# Patient Record
Sex: Female | Born: 1950 | Race: Black or African American | Hispanic: No | Smoking: Former smoker
Health system: Southern US, Community
[De-identification: ages and names within clinical notes are randomized; demographics above are authoritative.]

## PROBLEM LIST (undated history)

## (undated) DIAGNOSIS — J3089 Other allergic rhinitis: Secondary | ICD-10-CM

## (undated) DIAGNOSIS — Z972 Presence of dental prosthetic device (complete) (partial): Secondary | ICD-10-CM

## (undated) DIAGNOSIS — K219 Gastro-esophageal reflux disease without esophagitis: Secondary | ICD-10-CM

## (undated) DIAGNOSIS — M1990 Unspecified osteoarthritis, unspecified site: Secondary | ICD-10-CM

## (undated) DIAGNOSIS — F329 Major depressive disorder, single episode, unspecified: Secondary | ICD-10-CM

## (undated) DIAGNOSIS — K08109 Complete loss of teeth, unspecified cause, unspecified class: Secondary | ICD-10-CM

## (undated) DIAGNOSIS — F32A Depression, unspecified: Secondary | ICD-10-CM

## (undated) DIAGNOSIS — E785 Hyperlipidemia, unspecified: Secondary | ICD-10-CM

## (undated) DIAGNOSIS — Z9109 Other allergy status, other than to drugs and biological substances: Secondary | ICD-10-CM

## (undated) DIAGNOSIS — M25561 Pain in right knee: Secondary | ICD-10-CM

## (undated) DIAGNOSIS — D649 Anemia, unspecified: Secondary | ICD-10-CM

## (undated) DIAGNOSIS — M4800 Spinal stenosis, site unspecified: Secondary | ICD-10-CM

## (undated) DIAGNOSIS — M542 Cervicalgia: Secondary | ICD-10-CM

## (undated) DIAGNOSIS — M5416 Radiculopathy, lumbar region: Secondary | ICD-10-CM

## (undated) DIAGNOSIS — F319 Bipolar disorder, unspecified: Secondary | ICD-10-CM

## (undated) DIAGNOSIS — I10 Essential (primary) hypertension: Secondary | ICD-10-CM

## (undated) HISTORY — PX: APPENDECTOMY: SHX54

## (undated) HISTORY — DX: Radiculopathy, lumbar region: M54.16

## (undated) HISTORY — PX: TUBAL LIGATION: SHX77

## (undated) HISTORY — DX: Cervicalgia: M54.2

## (undated) HISTORY — DX: Major depressive disorder, single episode, unspecified: F32.9

## (undated) HISTORY — DX: Essential (primary) hypertension: I10

## (undated) HISTORY — PX: COLONOSCOPY: SHX174

## (undated) HISTORY — PX: ROTATOR CUFF REPAIR: SHX139

## (undated) HISTORY — DX: Depression, unspecified: F32.A

## (undated) HISTORY — PX: BREAST SURGERY: SHX581

## (undated) HISTORY — DX: Hyperlipidemia, unspecified: E78.5

## (undated) HISTORY — PX: OTHER SURGICAL HISTORY: SHX169

## (undated) HISTORY — DX: Gastro-esophageal reflux disease without esophagitis: K21.9

## (undated) HISTORY — DX: Pain in right knee: M25.561

---

## 1997-08-21 ENCOUNTER — Emergency Department (HOSPITAL_COMMUNITY): Admission: EM | Admit: 1997-08-21 | Discharge: 1997-08-21 | Payer: Self-pay | Admitting: Emergency Medicine

## 1997-09-19 ENCOUNTER — Emergency Department (HOSPITAL_COMMUNITY): Admission: EM | Admit: 1997-09-19 | Discharge: 1997-09-19 | Payer: Self-pay | Admitting: Emergency Medicine

## 1997-11-28 ENCOUNTER — Encounter: Payer: Self-pay | Admitting: Emergency Medicine

## 1997-11-28 ENCOUNTER — Emergency Department (HOSPITAL_COMMUNITY): Admission: EM | Admit: 1997-11-28 | Discharge: 1997-11-28 | Payer: Self-pay | Admitting: Emergency Medicine

## 1998-03-06 ENCOUNTER — Encounter: Payer: Self-pay | Admitting: Emergency Medicine

## 1998-03-06 ENCOUNTER — Emergency Department (HOSPITAL_COMMUNITY): Admission: EM | Admit: 1998-03-06 | Discharge: 1998-03-06 | Payer: Self-pay | Admitting: Emergency Medicine

## 1998-03-21 ENCOUNTER — Inpatient Hospital Stay (HOSPITAL_COMMUNITY): Admission: EM | Admit: 1998-03-21 | Discharge: 1998-03-25 | Payer: Self-pay | Admitting: Emergency Medicine

## 1998-03-22 ENCOUNTER — Encounter: Payer: Self-pay | Admitting: Internal Medicine

## 1998-03-25 ENCOUNTER — Inpatient Hospital Stay (HOSPITAL_COMMUNITY): Admission: AD | Admit: 1998-03-25 | Discharge: 1998-04-03 | Payer: Self-pay | Admitting: *Deleted

## 1998-04-12 ENCOUNTER — Encounter: Admission: RE | Admit: 1998-04-12 | Discharge: 1998-04-12 | Payer: Self-pay | Admitting: Internal Medicine

## 1998-04-24 ENCOUNTER — Encounter: Payer: Self-pay | Admitting: Emergency Medicine

## 1998-04-24 ENCOUNTER — Emergency Department (HOSPITAL_COMMUNITY): Admission: EM | Admit: 1998-04-24 | Discharge: 1998-04-24 | Payer: Self-pay | Admitting: Emergency Medicine

## 1998-07-07 ENCOUNTER — Emergency Department (HOSPITAL_COMMUNITY): Admission: EM | Admit: 1998-07-07 | Discharge: 1998-07-07 | Payer: Self-pay | Admitting: Emergency Medicine

## 1999-02-26 ENCOUNTER — Encounter: Payer: Self-pay | Admitting: Emergency Medicine

## 1999-02-26 ENCOUNTER — Emergency Department (HOSPITAL_COMMUNITY): Admission: EM | Admit: 1999-02-26 | Discharge: 1999-02-26 | Payer: Self-pay | Admitting: Emergency Medicine

## 1999-03-08 ENCOUNTER — Emergency Department (HOSPITAL_COMMUNITY): Admission: EM | Admit: 1999-03-08 | Discharge: 1999-03-08 | Payer: Self-pay | Admitting: Emergency Medicine

## 1999-03-08 ENCOUNTER — Encounter: Payer: Self-pay | Admitting: Emergency Medicine

## 1999-05-10 ENCOUNTER — Emergency Department (HOSPITAL_COMMUNITY): Admission: EM | Admit: 1999-05-10 | Discharge: 1999-05-10 | Payer: Self-pay | Admitting: Emergency Medicine

## 1999-08-06 ENCOUNTER — Emergency Department (HOSPITAL_COMMUNITY): Admission: EM | Admit: 1999-08-06 | Discharge: 1999-08-06 | Payer: Self-pay | Admitting: *Deleted

## 1999-12-29 ENCOUNTER — Emergency Department (HOSPITAL_COMMUNITY): Admission: EM | Admit: 1999-12-29 | Discharge: 1999-12-29 | Payer: Self-pay | Admitting: Emergency Medicine

## 2000-02-21 ENCOUNTER — Encounter: Admission: RE | Admit: 2000-02-21 | Discharge: 2000-02-21 | Payer: Self-pay | Admitting: Internal Medicine

## 2000-04-03 ENCOUNTER — Encounter: Admission: RE | Admit: 2000-04-03 | Discharge: 2000-04-03 | Payer: Self-pay | Admitting: Internal Medicine

## 2000-06-28 ENCOUNTER — Emergency Department (HOSPITAL_COMMUNITY): Admission: EM | Admit: 2000-06-28 | Discharge: 2000-06-28 | Payer: Self-pay | Admitting: Emergency Medicine

## 2000-06-29 ENCOUNTER — Encounter: Payer: Self-pay | Admitting: Emergency Medicine

## 2000-06-30 ENCOUNTER — Emergency Department (HOSPITAL_COMMUNITY): Admission: EM | Admit: 2000-06-30 | Discharge: 2000-06-30 | Payer: Self-pay | Admitting: Internal Medicine

## 2000-06-30 ENCOUNTER — Encounter: Payer: Self-pay | Admitting: Emergency Medicine

## 2000-07-31 ENCOUNTER — Emergency Department (HOSPITAL_COMMUNITY): Admission: EM | Admit: 2000-07-31 | Discharge: 2000-07-31 | Payer: Self-pay | Admitting: Emergency Medicine

## 2000-11-23 ENCOUNTER — Emergency Department (HOSPITAL_COMMUNITY): Admission: EM | Admit: 2000-11-23 | Discharge: 2000-11-23 | Payer: Self-pay | Admitting: Emergency Medicine

## 2002-06-02 ENCOUNTER — Emergency Department (HOSPITAL_COMMUNITY): Admission: EM | Admit: 2002-06-02 | Discharge: 2002-06-02 | Payer: Self-pay | Admitting: *Deleted

## 2002-06-02 ENCOUNTER — Encounter: Payer: Self-pay | Admitting: *Deleted

## 2002-06-02 ENCOUNTER — Encounter: Payer: Self-pay | Admitting: Emergency Medicine

## 2002-06-06 ENCOUNTER — Emergency Department (HOSPITAL_COMMUNITY): Admission: EM | Admit: 2002-06-06 | Discharge: 2002-06-06 | Payer: Self-pay | Admitting: Emergency Medicine

## 2003-05-20 ENCOUNTER — Encounter: Admission: RE | Admit: 2003-05-20 | Discharge: 2003-05-20 | Payer: Self-pay | Admitting: Internal Medicine

## 2003-06-16 ENCOUNTER — Emergency Department (HOSPITAL_COMMUNITY): Admission: EM | Admit: 2003-06-16 | Discharge: 2003-06-16 | Payer: Self-pay | Admitting: Family Medicine

## 2003-08-01 ENCOUNTER — Encounter: Admission: RE | Admit: 2003-08-01 | Discharge: 2003-08-01 | Payer: Self-pay | Admitting: Internal Medicine

## 2003-08-23 ENCOUNTER — Emergency Department (HOSPITAL_COMMUNITY): Admission: EM | Admit: 2003-08-23 | Discharge: 2003-08-23 | Payer: Self-pay | Admitting: Emergency Medicine

## 2003-11-04 ENCOUNTER — Emergency Department (HOSPITAL_COMMUNITY): Admission: EM | Admit: 2003-11-04 | Discharge: 2003-11-04 | Payer: Self-pay | Admitting: Emergency Medicine

## 2004-01-09 ENCOUNTER — Emergency Department (HOSPITAL_COMMUNITY): Admission: EM | Admit: 2004-01-09 | Discharge: 2004-01-09 | Payer: Self-pay | Admitting: Family Medicine

## 2004-03-08 ENCOUNTER — Ambulatory Visit: Payer: Self-pay | Admitting: Internal Medicine

## 2004-04-28 ENCOUNTER — Emergency Department (HOSPITAL_COMMUNITY): Admission: EM | Admit: 2004-04-28 | Discharge: 2004-04-28 | Payer: Self-pay | Admitting: Family Medicine

## 2004-07-09 ENCOUNTER — Emergency Department (HOSPITAL_COMMUNITY): Admission: EM | Admit: 2004-07-09 | Discharge: 2004-07-09 | Payer: Self-pay | Admitting: Emergency Medicine

## 2004-10-15 ENCOUNTER — Emergency Department (HOSPITAL_COMMUNITY): Admission: EM | Admit: 2004-10-15 | Discharge: 2004-10-15 | Payer: Self-pay | Admitting: Emergency Medicine

## 2005-01-12 ENCOUNTER — Emergency Department (HOSPITAL_COMMUNITY): Admission: EM | Admit: 2005-01-12 | Discharge: 2005-01-12 | Payer: Self-pay | Admitting: Emergency Medicine

## 2005-03-11 ENCOUNTER — Emergency Department (HOSPITAL_COMMUNITY): Admission: EM | Admit: 2005-03-11 | Discharge: 2005-03-11 | Payer: Self-pay | Admitting: Family Medicine

## 2005-11-02 ENCOUNTER — Emergency Department (HOSPITAL_COMMUNITY): Admission: EM | Admit: 2005-11-02 | Discharge: 2005-11-02 | Payer: Self-pay | Admitting: Family Medicine

## 2006-02-07 ENCOUNTER — Emergency Department (HOSPITAL_COMMUNITY): Admission: EM | Admit: 2006-02-07 | Discharge: 2006-02-07 | Payer: Self-pay | Admitting: Family Medicine

## 2006-02-11 HISTORY — PX: VAGINAL HYSTERECTOMY: SUR661

## 2006-03-07 ENCOUNTER — Emergency Department (HOSPITAL_COMMUNITY): Admission: EM | Admit: 2006-03-07 | Discharge: 2006-03-07 | Payer: Self-pay | Admitting: Family Medicine

## 2006-04-07 ENCOUNTER — Emergency Department (HOSPITAL_COMMUNITY): Admission: EM | Admit: 2006-04-07 | Discharge: 2006-04-07 | Payer: Self-pay | Admitting: Emergency Medicine

## 2006-07-08 ENCOUNTER — Emergency Department (HOSPITAL_COMMUNITY): Admission: EM | Admit: 2006-07-08 | Discharge: 2006-07-08 | Payer: Self-pay | Admitting: Family Medicine

## 2006-09-04 ENCOUNTER — Ambulatory Visit: Payer: Self-pay | Admitting: Obstetrics & Gynecology

## 2006-09-25 ENCOUNTER — Encounter (INDEPENDENT_AMBULATORY_CARE_PROVIDER_SITE_OTHER): Payer: Self-pay | Admitting: Gynecology

## 2006-09-25 ENCOUNTER — Ambulatory Visit: Payer: Self-pay | Admitting: Gynecology

## 2006-10-13 HISTORY — PX: OTHER SURGICAL HISTORY: SHX169

## 2006-10-14 ENCOUNTER — Inpatient Hospital Stay (HOSPITAL_COMMUNITY): Admission: RE | Admit: 2006-10-14 | Discharge: 2006-10-16 | Payer: Self-pay | Admitting: Gynecology

## 2006-10-14 ENCOUNTER — Encounter (INDEPENDENT_AMBULATORY_CARE_PROVIDER_SITE_OTHER): Payer: Self-pay | Admitting: Gynecology

## 2006-10-14 ENCOUNTER — Ambulatory Visit: Payer: Self-pay | Admitting: Gynecology

## 2006-10-31 ENCOUNTER — Ambulatory Visit: Payer: Self-pay | Admitting: Obstetrics & Gynecology

## 2006-11-06 ENCOUNTER — Ambulatory Visit: Payer: Self-pay | Admitting: *Deleted

## 2007-03-24 ENCOUNTER — Emergency Department (HOSPITAL_COMMUNITY): Admission: EM | Admit: 2007-03-24 | Discharge: 2007-03-24 | Payer: Self-pay | Admitting: Emergency Medicine

## 2007-04-09 ENCOUNTER — Ambulatory Visit (HOSPITAL_COMMUNITY): Admission: RE | Admit: 2007-04-09 | Discharge: 2007-04-09 | Payer: Self-pay | Admitting: Family Medicine

## 2007-05-12 ENCOUNTER — Emergency Department (HOSPITAL_COMMUNITY): Admission: EM | Admit: 2007-05-12 | Discharge: 2007-05-12 | Payer: Self-pay | Admitting: Emergency Medicine

## 2007-05-20 ENCOUNTER — Encounter (INDEPENDENT_AMBULATORY_CARE_PROVIDER_SITE_OTHER): Payer: Self-pay | Admitting: Internal Medicine

## 2007-05-20 ENCOUNTER — Ambulatory Visit: Payer: Self-pay | Admitting: Hospitalist

## 2007-05-20 DIAGNOSIS — I10 Essential (primary) hypertension: Secondary | ICD-10-CM

## 2007-05-21 LAB — CONVERTED CEMR LAB
ALT: 21 units/L (ref 0–35)
Basophils Absolute: 0 10*3/uL (ref 0.0–0.1)
CO2: 24 meq/L (ref 19–32)
Calcium: 9.3 mg/dL (ref 8.4–10.5)
Chloride: 104 meq/L (ref 96–112)
Creatinine, Ser: 0.81 mg/dL (ref 0.40–1.20)
HCT: 36.6 % (ref 36.0–46.0)
Hemoglobin: 11.5 g/dL — ABNORMAL LOW (ref 12.0–15.0)
Lymphocytes Relative: 45 % (ref 12–46)
Lymphs Abs: 1.6 10*3/uL (ref 0.7–4.0)
Monocytes Absolute: 0.3 10*3/uL (ref 0.1–1.0)
Neutro Abs: 1.4 10*3/uL — ABNORMAL LOW (ref 1.7–7.7)
Sodium: 141 meq/L (ref 135–145)
Total Protein: 8.1 g/dL (ref 6.0–8.3)
WBC: 3.5 10*3/uL — ABNORMAL LOW (ref 4.0–10.5)

## 2007-06-18 ENCOUNTER — Ambulatory Visit: Payer: Self-pay | Admitting: Infectious Disease

## 2007-06-18 DIAGNOSIS — E785 Hyperlipidemia, unspecified: Secondary | ICD-10-CM

## 2007-07-13 ENCOUNTER — Encounter (INDEPENDENT_AMBULATORY_CARE_PROVIDER_SITE_OTHER): Payer: Self-pay | Admitting: Internal Medicine

## 2007-07-13 ENCOUNTER — Ambulatory Visit: Payer: Self-pay | Admitting: Internal Medicine

## 2007-07-15 LAB — CONVERTED CEMR LAB
CO2: 25 meq/L (ref 19–32)
Glucose, Bld: 75 mg/dL (ref 70–99)
Potassium: 3.3 meq/L — ABNORMAL LOW (ref 3.5–5.3)
Sodium: 141 meq/L (ref 135–145)

## 2007-07-22 ENCOUNTER — Telehealth: Payer: Self-pay | Admitting: *Deleted

## 2007-09-07 ENCOUNTER — Emergency Department (HOSPITAL_COMMUNITY): Admission: EM | Admit: 2007-09-07 | Discharge: 2007-09-07 | Payer: Self-pay | Admitting: Emergency Medicine

## 2007-09-11 ENCOUNTER — Telehealth: Payer: Self-pay | Admitting: *Deleted

## 2007-09-18 ENCOUNTER — Encounter (INDEPENDENT_AMBULATORY_CARE_PROVIDER_SITE_OTHER): Payer: Self-pay | Admitting: Internal Medicine

## 2007-09-18 ENCOUNTER — Ambulatory Visit: Payer: Self-pay | Admitting: Infectious Diseases

## 2007-09-18 LAB — CONVERTED CEMR LAB
CO2: 22 meq/L (ref 19–32)
Calcium: 9.5 mg/dL (ref 8.4–10.5)
Creatinine, Ser: 0.85 mg/dL (ref 0.40–1.20)
Glucose, Bld: 81 mg/dL (ref 70–99)
Sodium: 139 meq/L (ref 135–145)

## 2007-12-18 ENCOUNTER — Emergency Department (HOSPITAL_COMMUNITY): Admission: EM | Admit: 2007-12-18 | Discharge: 2007-12-18 | Payer: Self-pay | Admitting: Family Medicine

## 2007-12-30 ENCOUNTER — Telehealth: Payer: Self-pay | Admitting: *Deleted

## 2008-04-14 ENCOUNTER — Emergency Department (HOSPITAL_COMMUNITY): Admission: EM | Admit: 2008-04-14 | Discharge: 2008-04-14 | Payer: Self-pay | Admitting: Emergency Medicine

## 2008-04-28 ENCOUNTER — Encounter (INDEPENDENT_AMBULATORY_CARE_PROVIDER_SITE_OTHER): Payer: Self-pay | Admitting: Internal Medicine

## 2008-04-28 ENCOUNTER — Ambulatory Visit: Payer: Self-pay | Admitting: *Deleted

## 2008-04-28 DIAGNOSIS — M25519 Pain in unspecified shoulder: Secondary | ICD-10-CM | POA: Insufficient documentation

## 2008-05-05 ENCOUNTER — Ambulatory Visit (HOSPITAL_COMMUNITY): Admission: RE | Admit: 2008-05-05 | Discharge: 2008-05-05 | Payer: Self-pay | Admitting: Internal Medicine

## 2008-05-10 ENCOUNTER — Emergency Department (HOSPITAL_COMMUNITY): Admission: EM | Admit: 2008-05-10 | Discharge: 2008-05-10 | Payer: Self-pay | Admitting: Family Medicine

## 2008-05-11 ENCOUNTER — Telehealth: Payer: Self-pay | Admitting: *Deleted

## 2008-05-12 ENCOUNTER — Ambulatory Visit: Payer: Self-pay | Admitting: Internal Medicine

## 2008-05-12 DIAGNOSIS — D649 Anemia, unspecified: Secondary | ICD-10-CM | POA: Insufficient documentation

## 2008-05-12 DIAGNOSIS — D509 Iron deficiency anemia, unspecified: Secondary | ICD-10-CM | POA: Insufficient documentation

## 2008-05-12 DIAGNOSIS — H9319 Tinnitus, unspecified ear: Secondary | ICD-10-CM | POA: Insufficient documentation

## 2008-05-18 LAB — CONVERTED CEMR LAB
ALT: 20 units/L (ref 0–35)
AST: 26 units/L (ref 0–37)
Albumin: 4.4 g/dL (ref 3.5–5.2)
Alkaline Phosphatase: 74 units/L (ref 39–117)
Cholesterol: 219 mg/dL — ABNORMAL HIGH (ref 0–200)
Eosinophils Relative: 2 % (ref 0–5)
HCT: 37.6 % (ref 36.0–46.0)
Lymphocytes Relative: 39 % (ref 12–46)
Lymphs Abs: 1.8 10*3/uL (ref 0.7–4.0)
MCV: 82.8 fL (ref 78.0–100.0)
Monocytes Absolute: 0.4 10*3/uL (ref 0.1–1.0)
Monocytes Relative: 9 % (ref 3–12)
Potassium: 3.9 meq/L (ref 3.5–5.3)
RDW: 13.9 % (ref 11.5–15.5)
Sodium: 140 meq/L (ref 135–145)
TSH: 1.839 microintl units/mL (ref 0.350–4.500)
Total Protein: 7.7 g/dL (ref 6.0–8.3)

## 2008-06-22 ENCOUNTER — Emergency Department (HOSPITAL_COMMUNITY): Admission: EM | Admit: 2008-06-22 | Discharge: 2008-06-22 | Payer: Self-pay | Admitting: Family Medicine

## 2008-07-22 ENCOUNTER — Inpatient Hospital Stay (HOSPITAL_COMMUNITY): Admission: EM | Admit: 2008-07-22 | Discharge: 2008-07-22 | Payer: Self-pay | Admitting: Emergency Medicine

## 2008-07-22 ENCOUNTER — Encounter (INDEPENDENT_AMBULATORY_CARE_PROVIDER_SITE_OTHER): Payer: Self-pay | Admitting: Internal Medicine

## 2008-07-22 ENCOUNTER — Ambulatory Visit: Payer: Self-pay | Admitting: *Deleted

## 2008-07-22 DIAGNOSIS — E876 Hypokalemia: Secondary | ICD-10-CM

## 2008-08-19 ENCOUNTER — Encounter: Payer: Self-pay | Admitting: Internal Medicine

## 2008-08-19 ENCOUNTER — Ambulatory Visit: Payer: Self-pay | Admitting: Internal Medicine

## 2008-08-19 DIAGNOSIS — M542 Cervicalgia: Secondary | ICD-10-CM

## 2008-08-19 DIAGNOSIS — R519 Headache, unspecified: Secondary | ICD-10-CM | POA: Insufficient documentation

## 2008-08-19 DIAGNOSIS — R51 Headache: Secondary | ICD-10-CM | POA: Insufficient documentation

## 2008-08-19 LAB — CONVERTED CEMR LAB
CO2: 22 meq/L (ref 19–32)
Calcium: 9.5 mg/dL (ref 8.4–10.5)
Creatinine, Ser: 0.94 mg/dL (ref 0.40–1.20)
Sodium: 145 meq/L (ref 135–145)

## 2008-08-22 ENCOUNTER — Encounter: Payer: Self-pay | Admitting: Internal Medicine

## 2008-11-14 ENCOUNTER — Ambulatory Visit: Payer: Self-pay | Admitting: Internal Medicine

## 2008-11-14 ENCOUNTER — Encounter: Payer: Self-pay | Admitting: Internal Medicine

## 2008-11-14 LAB — CONVERTED CEMR LAB
Albumin: 4.3 g/dL (ref 3.5–5.2)
Alkaline Phosphatase: 81 units/L (ref 39–117)
Calcium: 9.5 mg/dL (ref 8.4–10.5)
Chlamydia, DNA Probe: NEGATIVE
GC Probe Amp, Genital: NEGATIVE
Glucose, Bld: 88 mg/dL (ref 70–99)
Hep A IgM: NEGATIVE
Hep B C IgM: NEGATIVE
Hepatitis B Surface Ag: NEGATIVE
Potassium: 3.9 meq/L (ref 3.5–5.3)
Sodium: 142 meq/L (ref 135–145)
Total Bilirubin: 0.3 mg/dL (ref 0.3–1.2)

## 2008-11-15 ENCOUNTER — Telehealth: Payer: Self-pay | Admitting: *Deleted

## 2008-11-21 ENCOUNTER — Ambulatory Visit: Payer: Self-pay | Admitting: Internal Medicine

## 2008-11-21 LAB — CONVERTED CEMR LAB
OCCULT 1: NEGATIVE
OCCULT 2: NEGATIVE
OCCULT 3: NEGATIVE

## 2008-11-28 ENCOUNTER — Encounter (INDEPENDENT_AMBULATORY_CARE_PROVIDER_SITE_OTHER): Payer: Self-pay | Admitting: Internal Medicine

## 2008-11-28 ENCOUNTER — Ambulatory Visit: Payer: Self-pay | Admitting: Internal Medicine

## 2008-12-01 LAB — CONVERTED CEMR LAB: HDL: 40 mg/dL (ref >39)

## 2008-12-22 ENCOUNTER — Ambulatory Visit: Payer: Self-pay | Admitting: Internal Medicine

## 2008-12-22 DIAGNOSIS — R0602 Shortness of breath: Secondary | ICD-10-CM | POA: Insufficient documentation

## 2008-12-23 LAB — CONVERTED CEMR LAB
Basophils Relative: 1 % (ref 0–1)
CO2: 25 meq/L (ref 19–32)
Calcium: 9.3 mg/dL (ref 8.4–10.5)
Creatinine, Ser: 0.81 mg/dL (ref 0.40–1.20)
HCT: 36.7 % (ref 36.0–46.0)
Lymphs Abs: 2.2 10*3/uL (ref 0.7–4.0)
Monocytes Relative: 5 % (ref 3–12)
Platelets: 325 10*3/uL (ref 150–400)
RDW: 13.5 % (ref 11.5–15.5)
Sodium: 144 meq/L (ref 135–145)
WBC: 6.4 10*3/uL (ref 4.0–10.5)

## 2009-01-12 ENCOUNTER — Ambulatory Visit: Payer: Self-pay | Admitting: Internal Medicine

## 2009-02-17 ENCOUNTER — Ambulatory Visit: Payer: Self-pay | Admitting: Gastroenterology

## 2009-03-01 ENCOUNTER — Ambulatory Visit: Payer: Self-pay | Admitting: Gastroenterology

## 2009-03-03 ENCOUNTER — Encounter: Payer: Self-pay | Admitting: Gastroenterology

## 2009-03-12 ENCOUNTER — Emergency Department (HOSPITAL_COMMUNITY): Admission: EM | Admit: 2009-03-12 | Discharge: 2009-03-12 | Payer: Self-pay | Admitting: Emergency Medicine

## 2009-03-22 ENCOUNTER — Encounter: Admission: RE | Admit: 2009-03-22 | Discharge: 2009-03-22 | Payer: Self-pay | Admitting: Chiropractic Medicine

## 2009-05-02 ENCOUNTER — Ambulatory Visit: Payer: Self-pay | Admitting: Internal Medicine

## 2009-06-05 ENCOUNTER — Emergency Department (HOSPITAL_COMMUNITY): Admission: EM | Admit: 2009-06-05 | Discharge: 2009-06-05 | Payer: Self-pay | Admitting: Family Medicine

## 2009-06-05 ENCOUNTER — Encounter: Payer: Self-pay | Admitting: Internal Medicine

## 2009-07-05 ENCOUNTER — Telehealth: Payer: Self-pay | Admitting: Internal Medicine

## 2009-09-12 ENCOUNTER — Ambulatory Visit: Payer: Self-pay | Admitting: Internal Medicine

## 2009-09-13 ENCOUNTER — Telehealth: Payer: Self-pay | Admitting: Internal Medicine

## 2009-11-09 ENCOUNTER — Emergency Department (HOSPITAL_COMMUNITY): Admission: EM | Admit: 2009-11-09 | Discharge: 2009-11-09 | Payer: Self-pay | Admitting: Family Medicine

## 2010-02-03 ENCOUNTER — Inpatient Hospital Stay (HOSPITAL_COMMUNITY)
Admission: AD | Admit: 2010-02-03 | Discharge: 2010-02-03 | Payer: Self-pay | Source: Home / Self Care | Attending: Obstetrics & Gynecology | Admitting: Obstetrics & Gynecology

## 2010-02-08 ENCOUNTER — Ambulatory Visit: Payer: Self-pay | Admitting: Obstetrics and Gynecology

## 2010-02-08 ENCOUNTER — Ambulatory Visit (HOSPITAL_COMMUNITY): Admission: RE | Admit: 2010-02-08 | Payer: Self-pay | Source: Home / Self Care | Admitting: Obstetrics & Gynecology

## 2010-03-04 ENCOUNTER — Encounter: Payer: Self-pay | Admitting: Internal Medicine

## 2010-03-04 ENCOUNTER — Encounter: Payer: Self-pay | Admitting: Obstetrics & Gynecology

## 2010-03-15 ENCOUNTER — Inpatient Hospital Stay (INDEPENDENT_AMBULATORY_CARE_PROVIDER_SITE_OTHER)
Admission: RE | Admit: 2010-03-15 | Discharge: 2010-03-15 | Disposition: A | Discharge status: Home / Self Care | Payer: Self-pay | Source: Ambulatory Visit | Attending: Family Medicine | Admitting: Family Medicine

## 2010-03-15 DIAGNOSIS — K047 Periapical abscess without sinus: Secondary | ICD-10-CM

## 2010-03-15 NOTE — Progress Notes (Signed)
Summary: refill/gg  Phone Note Refill Request  on Jul 05, 2009 4:34 PM  Refills Requested: Medication #1:  PRILOSEC 20 MG CAPDR Take one (1) by mouth once a day   Last Refilled: 04/21/2009  Method Requested: Fax to Local Pharmacy Initial call taken by: Merrie Roof RN,  Jul 05, 2009 4:34 PM  Follow-up for Phone Call        Refill approved-nurse to complete Follow-up by: [name],  Jul 08, 2009 10:14 AM  Additional Follow-up for Phone Call Additional follow up Details #1::        Rx faxed to pharmacy Additional Follow-up by: Merrie Roof RN,  Jul 11, 2009 2:55 PM    Prescriptions: PRILOSEC 20 MG CAPDR (OMEPRAZOLE) Take one (1) by mouth once a day  #30 x 1   Entered and Authorized by:   Ulyess Mort MD   Signed by:   Ulyess Mort MD on 07/11/2009   Method used:   Telephoned to ...       [facility] Department (retail)       [address] [place], [place]  [address]       Ph: [phone]       Fax: [phone]   RxID:   [id number]

## 2010-03-15 NOTE — Progress Notes (Signed)
Summary: med change/gp  Phone Note Refill Request Message from:  Fax from Pharmacy on September 13, 2009 1:38 PM  Refills Requested: Medication #1:  AMOXICILLIN 250 MG CAPS Take 1 tablet by mouth two times a day qith meals for 7 days. Pharmacy do have 250mg  but they do supply 500mg  or 875mg .  Need new Rx. Thanks   Method Requested: Telephone to Pharmacy Initial call taken by: Chinita Pester RN,  September 13, 2009 1:38 PM    New/Updated Medications: AMOXICILLIN 500 MG CAPS (AMOXICILLIN) Take 1 tablet by mouth two times a day Prescriptions: AMOXICILLIN 500 MG CAPS (AMOXICILLIN) Take 1 tablet by mouth two times a day  #14 x 0   Entered and Authorized by:   [name]   Signed by:   [name] on 09/13/2009   Method used:   Faxed to ...       [facility] Department (retail)       [address] [place], [place]  [address]       Ph: [phone]       Fax: [phone]   RxID:   [id number]

## 2010-03-15 NOTE — Procedures (Signed)
Summary: Colonoscopy  Patient: Olivia Ewing Note: All result statuses are Final unless otherwise noted.  Tests: (1) Colonoscopy (COL)   COL Colonoscopy           DONE     [facility]     [address]     [place], [place]  [address]           COLONOSCOPY PROCEDURE REPORT           PATIENT:  Olivia Ewing, Olivia Ewing  MR#:  [id number]     BIRTHDATE:  Oct 13, 1950, 58 yrs. old  GENDER:  female           ENDOSCOPIST:  Vania Rea. Jarold Motto, MD, [facility]     Referred by:           PROCEDURE DATE:  03/01/2009     PROCEDURE:  Colonoscopy with biopsy     ASA CLASS:  Class II     INDICATIONS:  Colorectal Cancer Screening           MEDICATIONS:   Fentanyl 75 mcg IV, Versed 9 mg IV           DESCRIPTION OF PROCEDURE:   After the risks benefits and     alternatives of the procedure were thoroughly explained, informed     consent was obtained.  Digital rectal exam was performed and     revealed no abnormalities.   The LB [id number] endoscope     was introduced through the anus and advanced to the cecum, which     was identified by both the appendix and ileocecal valve, limited     by poor preparation.    The quality of the prep was adequate,     using MoviPrep.  The instrument was then slowly withdrawn as the     colon was fully examined.     <<PROCEDUREIMAGES>>           FINDINGS:  No polyps or cancers were seen.  This was otherwise a     normal examination of the colon. MULTIPLE 1-2MM RECTOSIGMOID     NODULES BIOPSIED.PROBABLE HYPERPLASTIC POLYPS.   Retroflexed views     in the rectum revealed no abnormalities.    The scope was then     withdrawn from the patient and the procedure completed.           COMPLICATIONS:  None           ENDOSCOPIC IMPRESSION:     1) No polyps or cancers     2) Otherwise normal examination     R/O ADENOMAS VS HYPERPLASTIC RECTAL NODULES.     RECOMMENDATIONS:     1) If the polyp(s) removed today are proven to be adenomatous     (pre-cancerous)  polyps, you will need a repeat colonoscopy in 5     years. Otherwise you should continue to follow colorectal cancer     screening guidelines for "routine risk" patients with colonoscopy     in 10 years.           REPEAT EXAM:  No           ______________________________     Vania Rea. Jarold Motto, MD, [facility]           CC:           n.     eSIGNED:   Vania Rea. Merilyn Pagan at 03/01/2009 11:15 AM           Lilyan Punt, [id number]  Note: An exclamation mark (!) indicates a result that was not dispersed into the flowsheet. Document Creation Date: 03/01/2009 11:15 AM _______________________________________________________________________  (1) Order result status: Final Collection or observation date-time: 03/01/2009 11:09 Requested date-time:  Receipt date-time:  Reported date-time:  Referring Physician:   Ordering Physician: Sheryn Bison [phone]) Specimen Source:  Source: Launa Grill Order Number: [id number] Lab site:   Appended Document: Colonoscopy 10y f/u  Appended Document: Colonoscopy     Procedures Next Due Date:    Colonoscopy: 03/2019

## 2010-03-15 NOTE — Letter (Signed)
Summary: Patient Notice- Polyp Results  [facility]  [address] [place], [place] [address]   Phone: [phone]  Fax: [phone]        March 03, 2009 MRN: [id number]    [facility] [address] [place], [place]  [address]    Dear Ms. Kolasinski,  I am pleased to inform you that the colon polyp(s) removed during your recent colonoscopy was (were) found to be benign (no cancer detected) upon pathologic examination.  I recommend you have a repeat colonoscopy examination in 10_ years to look for recurrent polyps, as having colon polyps increases your risk for having recurrent polyps or even colon cancer in the future.  Should you develop new or worsening symptoms of abdominal pain, bowel habit changes or bleeding from the rectum or bowels, please schedule an evaluation with either your primary care physician or with me.  Additional information/recommendations:  x__ No further action with gastroenterology is needed at this time. Please      follow-up with your primary care physician for your other healthcare      needs.  __ Please call [phone] to schedule a return visit to review your      situation.  __ Please keep your follow-up visit as already scheduled.  __ Continue treatment plan as outlined the day of your exam.  Please call us if you are having persistent problems or have questions about your condition that have not been fully answered at this time.  Sincerely,  Mardella Layman MD [facility]  This letter has been electronically signed by your physician.  Appended Document: Patient Notice- Polyp Results Letter mailed 1.24.11.
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
## 2010-03-15 NOTE — Assessment & Plan Note (Signed)
Summary: ACUTE-WANTS BP MEDS CHANGED/CFB(Olivia Ewing)   Vital Signs:  Patient profile:   60 year old female Height:      63.5 inches (161.29 cm) Weight:      183.03 pounds (83.20 kg) BMI:     32.03 Temp:     97.5 degrees F (36.39 degrees C) oral Pulse rate:   102 / minute BP sitting:   170 / 108  (right arm)  Vitals Entered By: Angelina Ok RN (May 02, 2009 9:08 AM) CC: Depression Is Patient Diabetic? No Pain Assessment Patient in pain? yes     Location: shoulder Intensity: 8 Type: aching Onset of pain  Constant Nutritional Status BMI of > 30 = obese  Have you ever been in a relationship where you felt threatened, hurt or afraid?No   Does patient need assistance? Functional Status Self care Ambulation Normal Comments Worried about B/P elevation. Lightheaded this am.   No chest or head pain.  Drink a lot of Pepsi's , Coke and Chocolate.  Eats a bag  of Potato chhips a day.  Feels sluggish.  Wants a Cholesterol check today if possible.\par  Primary Care Provider:  [name]  CC:  Depression.  History of Present Illness: 60 yo women with PMH as described in emr is here today for follow up her high BP. She has been keeping alog of her high BP and all the readings are in high 160's over 100's. No other cpmplaints.  Depression History:      The patient denies a depressed mood most of the day and a diminished interest in her usual daily activities.         Problems Prior to Update: 1)  Dyspnea  (ICD-786.05) 2)  Sx of Gastroesophageal Reflux Disease  (ICD-530.81) 3)  Sexually Transmitted Disease, Exposure To  (ICD-V01.6) 4)  Neck Pain, Chronic  (ICD-723.1) 5)  Headache  (ICD-784.0) 6)  Hypokalemia  (ICD-276.8) 7)  Anemia, Normocytic  (ICD-285.9) 8)  Tinnitus  (ICD-388.30) 9)  Shoulder Pain, Right  (ICD-719.41) 10)  Hyperlipidemia, Mild  (ICD-272.4) 11)  Hypertension  (ICD-401.9) 12)  Preventive Health Care  (ICD-V70.0)  Medications Prior to Update: 1)   Cyclobenzaprine Hcl 10 Mg Tabs (Cyclobenzaprine Hcl) .... Take 1 Tab By Mouth At Bedtime As Needed 2)  Pravachol 20 Mg Tabs (Pravastatin Sodium) .... Take 1 Tab By Mouth At Bedtime 3)  Lisinopril 10 Mg Tabs (Lisinopril) .... Take 1 Tablet By Mouth Once A Day 4)  Prilosec 20 Mg Capdr (Omeprazole) .... Take One (1) By Mouth Once A Day 5)  Zyrtec Allergy 10 Mg Tabs (Cetirizine Hcl) .... Take 1 Tablet By Mouth Once A Day 6)  Proventil Hfa 108 (90 Base) Mcg/act Aers (Albuterol Sulfate) .... One Puff Every Four To Six Hours As Needed For Shortness of Breath. 7)  Amoxicillin 500 Mg Caps (Amoxicillin) .... Take 1 Capsule By Mouth Two Times A Day.  Current Medications (verified): 1)  Pravachol 20 Mg Tabs (Pravastatin Sodium) .... Take 1 Tab By Mouth At Bedtime 2)  Prilosec 20 Mg Capdr (Omeprazole) .... Take One (1) By Mouth Once A Day 3)  Zyrtec Allergy 10 Mg Tabs (Cetirizine Hcl) .... Take 1 Tablet By Mouth Once A Day 4)  Proventil Hfa 108 (90 Base) Mcg/act Aers (Albuterol Sulfate) .... One Puff Every Four To Six Hours As Needed For Shortness of Breath. 5)  Lisinopril 20 Mg Tabs (Lisinopril) .... Take 1 Tablet By Mouth Once A Day  Allergies (verified): No Known Drug Allergies  Directives: 1)  Full Code   Past History:  Past Medical History: Last updated: 07/13/2007 Hypertension Hyperlipidemia  Past Surgical History: Last updated: 11/21/2008 Hysterectomy (ovaries still remain, took out cervix - per patient) s/p bladder tack 9/08  Family History: Last updated: 05/12/2008 M 33 unknown (she was raised by her grandparents), htn and alcohol abuse F cancer, otherwise unknown 9 sibs - 1 bro with HIV, 1 sis with htn, otherwise unknown 4 children - 1 daughter with htn and depression, 1 son with htn  Social History: Last updated: 05/12/2008 [phone] ext. [phone] (shelter's #) can leave a message to have pt call back. Single Former Smoker Alcohol use-no Drug use-no Regular  exercise-yes  Risk Factors: Alcohol Use: <1 (01/12/2009) >5 drinks/d w/in last 3 months: no (01/12/2009) Exercise: yes (12/22/2008)  Risk Factors: Smoking Status: quit (01/12/2009) Packs/Day: 1-3 cig/day x15 years (01/12/2009)  Review of Systems      See HPI  Physical Exam  Additional Exam:  Gen: AOx3, in no acute distress Eyes: PERRL, EOMI ENT:MMM, No erythema noted in posterior pharynx Neck: No JVD, No LAP Chest: CTAB with  good respiratory effort CVS: regular rhythmic rate, NO M/R/G, S1 S2 normal Abdo: soft,ND, BS+x4, Non tender and No hepatosplenomegaly EXT: No odema noted Neuro: Non focal, gait is normal Skin: no rashes noted.    Impression & Recommendations:  Problem # 1:  HYPERTENSION (ICD-401.9) Assessment Deteriorated Since patient's BP readings are mostly between high 160's she will definately 2 BP meds in future. Patient was on hydrochlorthiazide untill she was admitted to the hospital in july last year for hypokalemia K=2.4. She taken off HCTZ and was started on Lisinopril since that time. Dr Meredith Pel doesnt feel comfortable starting the patient on combination HCTZ-Lisinopril and I agree with him. Plan is to go up on Lisinopril, modify diet with decreased sodas/salt and have her come back in 1 week. We may consider adding Norvasc. The following medications were removed from the medication list:    Lisinopril 10 Mg Tabs (Lisinopril) .Marland Kitchen... Take 1 tablet by mouth once a day Her updated medication list for this problem includes:    Lisinopril 20 Mg Tabs (Lisinopril) .Marland Kitchen... Take 1 tablet by mouth once a day  Orders: T-Basic Metabolic Panel ([id number])  BP today: 170/108 Prior BP: 152/100 (01/12/2009)  Labs Reviewed: K+: 3.0 (12/22/2008) Creat: : 0.81 (12/22/2008)   Chol: 202 (11/28/2008)   HDL: 40 (11/28/2008)   LDL: 135 (11/28/2008)   TG: 134 (11/28/2008)  Problem # 2:  HYPOKALEMIA (ICD-276.8) Assessment: Comment Only I will check Bmet today for K and crt. I  reviewed her K chart in EMR and found that other then her admission for hypokalemia, she has had normal K levels around 4.  Problem # 3:  PREVENTIVE HEALTH CARE (ICD-V70.0) Assessment: Comment Only reviewed and gave her preprinted material for HTN and lipd control including dietery advice.  Complete Medication List: 1)  Pravachol 20 Mg Tabs (Pravastatin sodium) .... Take 1 tab by mouth at bedtime 2)  Prilosec 20 Mg Capdr (Omeprazole) .... Take one (1) by mouth once a day 3)  Zyrtec Allergy 10 Mg Tabs (Cetirizine hcl) .... Take 1 tablet by mouth once a day 4)  Proventil Hfa 108 (90 Base) Mcg/act Aers (Albuterol sulfate) .... One puff every four to six hours as needed for shortness of breath. 5)  Lisinopril 20 Mg Tabs (Lisinopril) .... Take 1 tablet by mouth once a day  Patient Instructions: 1)  Please schedule a follow-up appointment in  1 weeks. 2)  It is important that you exercise regularly at least 20 minutes 5 times a week. If you develop chest pain, have severe difficulty breathing, or feel very tired , stop exercising immediately and seek medical attention. 3)  You need to lose weight. Consider a lower calorie diet and regular exercise.  4)  Check your Blood Pressure regularly. If it is above: you should make an appointment. 5)  Limit your Sodium (Salt). 6)  BMP prior to visit, ICD-9: 7)  Lipid Panel prior to visit, ICD-9: Prescriptions: LISINOPRIL 20 MG TABS (LISINOPRIL) Take 1 tablet by mouth once a day  #30 x 11   Entered and Authorized by:   Lars Mage MD   Signed by:   Lars Mage MD on 05/02/2009   Method used:   Print then Give to Patient   RxID:   [id number]    Vital Signs:  Patient profile:   60 year old female Height:      63.5 inches (161.29 cm) Weight:      183.03 pounds (83.20 kg) BMI:     32.03 Temp:     97.5 degrees F (36.39 degrees C) oral Pulse rate:   102 / minute BP sitting:   170 / 108  (right arm)  Vitals Entered By: Angelina Ok RN (May 02, 2009 9:08 AM)   Prevention & Chronic Care Immunizations   Influenza vaccine: Fluvax 3+  (11/14/2008)    Tetanus booster: 08/19/2008: Tdap   Tetanus booster due: 08/20/2018    Pneumococcal vaccine: Not documented  Colorectal Screening   Hemoccult: Not documented   Hemoccult action/deferral: Ordered  (11/14/2008)    Colonoscopy: DONE  (03/01/2009)   Colonoscopy action/deferral: GI referral  (11/21/2008)   Colonoscopy due: 03/2019  Other Screening   Pap smear: NEGATIVE FOR INTRAEPITHELIAL LESIONS OR MALIGNANCY.  (11/14/2008)   Pap smear action/deferral: Ordered  (11/14/2008)   Pap smear due: 11/14/2009    Mammogram: No specific mammographic evidence of malignancy.    (05/05/2008)   Mammogram action/deferral: Screening mammogram in 1 year.     (05/05/2008)   Mammogram due: 05/05/2009   Smoking status: quit  (01/12/2009)  Lipids   Total Cholesterol: 202  (11/28/2008)   Lipid panel action/deferral: Lipid Panel ordered   LDL: 135  (11/28/2008)   LDL Direct: Not documented   HDL: 40  (11/28/2008)   Triglycerides: 134  (11/28/2008)    SGOT (AST): 23  (11/14/2008)   SGPT (ALT): 21  (11/14/2008)   Alkaline phosphatase: 81  (11/14/2008)   Total bilirubin: 0.3  (11/14/2008)    Lipid flowsheet reviewed?: Yes   Progress toward LDL goal: At goal  Hypertension   Last Blood Pressure: 170 / 108  (05/02/2009)   Serum creatinine: 0.81  (12/22/2008)   BMP action: Ordered   Serum potassium 3.0  (12/22/2008)    Hypertension flowsheet reviewed?: Yes   Progress toward BP goal: Deteriorated  Self-Management Support :   Personal Goals (by the next clinic visit) :      Personal blood pressure goal: 130/80  (11/14/2008)     Personal LDL goal: 100  (11/14/2008)    Patient will work on the following items until the next clinic visit to reach self-care goals:     Medications and monitoring: take my medicines every day, check my blood pressure, bring all of my medications to every  visit  (05/02/2009)     Eating: drink diet soda or water instead of juice or soda, eat more  vegetables, use fresh or frozen vegetables, eat foods that are low in salt, eat baked foods instead of fried foods, eat fruit for snacks and desserts, limit or avoid alcohol  (05/02/2009)     Activity: take a 30 minute walk every day  (05/02/2009)     Other: bakes or boils own food, taking MVI, Vit. E,D, iron pill  (11/14/2008)    Hypertension self-management support: Written self-care plan, Education handout  (05/02/2009)   Hypertension self-care plan printed.   Hypertension education handout printed    Lipid self-management support: Written self-care plan, Education handout  (05/02/2009)   Lipid self-care plan printed.   Lipid education handout printed  Process Orders Check Orders Results:     [facility]: ABN not required for this insurance Tests Sent for requisitioning (May 02, 2009 8:39 PM):     05/02/2009: [facility] -- T-Basic Metabolic Panel [phone] (signed)

## 2010-03-15 NOTE — Miscellaneous (Signed)
Summary: [facility] Previsit/prep  Clinical Lists Changes  Medications: Added new medication of COLYTE WITH FLAVOR PACKS 240 GM  SOLR (PEG 3350-KCL-NABCB-NACL-NASULF) As per prep instructions. - Signed Rx of COLYTE WITH FLAVOR PACKS 240 GM  SOLR (PEG 3350-KCL-NABCB-NACL-NASULF) As per prep instructions.;  #1 x 0;  Signed;  Entered by: Wyona Almas RN;  Authorized by: Mardella Layman MD [facility];  Method used: Print then Give to Patient Observations: Added new observation of NKA: T (02/17/2009 12:47)    Prescriptions: COLYTE WITH FLAVOR PACKS 240 GM  SOLR (PEG 3350-KCL-NABCB-NACL-NASULF) As per prep instructions.  #1 x 0   Entered by:   Wyona Almas RN   Authorized by:   Mardella Layman MD [facility]   Signed by:   Wyona Almas RN on 02/17/2009   Method used:   Print then Give to Patient   RxID:   [id number]   Appended Document: [facility] Previsit/prep    Clinical Lists Changes  Medications: Added new medication of DULCOLAX 5 MG  TBEC (BISACODYL) Day before procedure take 2 at 2:30pm . - Signed Added new medication of METOCLOPRAMIDE HCL 10 MG  TABS (METOCLOPRAMIDE HCL) As per prep instructions. - Signed Rx of DULCOLAX 5 MG  TBEC (BISACODYL) Day before procedure take 2 at 2:30pm .;  #2 x 0;  Signed;  Entered by: Wyona Almas RN;  Authorized by: Mardella Layman MD [facility];  Method used: Print then Give to Patient Rx of METOCLOPRAMIDE HCL 10 MG  TABS (METOCLOPRAMIDE HCL) As per prep instructions.;  #2 x 0;  Signed;  Entered by: Wyona Almas RN;  Authorized by: Mardella Layman MD [facility];  Method used: Print then Give to Patient    Prescriptions: METOCLOPRAMIDE HCL 10 MG  TABS (METOCLOPRAMIDE HCL) As per prep instructions.  #2 x 0   Entered by:   Wyona Almas RN   Authorized by:   Mardella Layman MD [facility]   Signed by:   Wyona Almas RN on 02/17/2009   Method used:   Print then Give to Patient   RxID:   [id number] DULCOLAX 5 MG  TBEC (BISACODYL) Day before procedure take 2  at 2:30pm .  #2 x 0   Entered by:   Wyona Almas RN   Authorized by:   Mardella Layman MD [facility]   Signed by:   Wyona Almas RN on 02/17/2009   Method used:   Print then Give to Patient   RxID:   [id number]    Appended Document: [facility] Previsit/prep Pt. was given Colyte for her bowel prep because she is indigent without income or insurance and is covered by [facility] 100% and [facility].

## 2010-04-23 LAB — URINE MICROSCOPIC-ADD ON

## 2010-04-23 LAB — URINALYSIS, ROUTINE W REFLEX MICROSCOPIC
Nitrite: NEGATIVE
Specific Gravity, Urine: <1.005 — ABNORMAL LOW (ref 1.005–1.030)
Urobilinogen, UA: 0.2 mg/dL (ref 0.0–1.0)
pH: 6.5 (ref 5.0–8.0)

## 2010-04-23 LAB — CBC
HCT: 33.8 % — ABNORMAL LOW (ref 36.0–46.0)
Platelets: 312 10*3/uL (ref 150–400)
RBC: 4.05 MIL/uL (ref 3.87–5.11)
RDW: 13.8 % (ref 11.5–15.5)

## 2010-04-23 LAB — WET PREP, GENITAL
Trich, Wet Prep: NONE SEEN
Yeast Wet Prep HPF POC: NONE SEEN

## 2010-05-01 LAB — POCT I-STAT, CHEM 8
BUN: 7 mg/dL (ref 6–23)
Calcium, Ion: 1.14 mmol/L (ref 1.12–1.32)
Chloride: 106 mEq/L (ref 96–112)
Hemoglobin: 11.9 g/dL — ABNORMAL LOW (ref 12.0–15.0)
Sodium: 142 mEq/L (ref 135–145)
TCO2: 25 mmol/L (ref 0–100)

## 2010-05-21 LAB — BASIC METABOLIC PANEL
BUN: 10 mg/dL (ref 6–23)
BUN: 9 mg/dL (ref 6–23)
CO2: 25 mEq/L (ref 19–32)
CO2: 26 mEq/L (ref 19–32)
Calcium: 8.8 mg/dL (ref 8.4–10.5)
Chloride: 106 mEq/L (ref 96–112)
Creatinine, Ser: 0.84 mg/dL (ref 0.4–1.2)
GFR calc non Af Amer: >60 mL/min (ref >60)
Glucose, Bld: 89 mg/dL (ref 70–99)
Glucose, Bld: 92 mg/dL (ref 70–99)
Potassium: 2.4 mEq/L — CL (ref 3.5–5.1)
Sodium: 140 mEq/L (ref 135–145)
Sodium: 141 mEq/L (ref 135–145)

## 2010-05-21 LAB — DIFFERENTIAL
Basophils Absolute: 0 10*3/uL (ref 0.0–0.1)
Eosinophils Absolute: 0.1 10*3/uL (ref 0.0–0.7)
Eosinophils Relative: 3 % (ref 0–5)
Monocytes Absolute: 0.4 10*3/uL (ref 0.1–1.0)

## 2010-05-21 LAB — COMPREHENSIVE METABOLIC PANEL
ALT: 14 U/L (ref 0–35)
AST: 28 U/L (ref 0–37)
CO2: 30 mEq/L (ref 19–32)
Calcium: 9 mg/dL (ref 8.4–10.5)
Chloride: 108 mEq/L (ref 96–112)
GFR calc Af Amer: >60 mL/min (ref >60)
GFR calc non Af Amer: 56 mL/min — ABNORMAL LOW (ref >60)
Glucose, Bld: 100 mg/dL — ABNORMAL HIGH (ref 70–99)
Sodium: 143 mEq/L (ref 135–145)
Total Bilirubin: 0.2 mg/dL — ABNORMAL LOW (ref 0.3–1.2)

## 2010-05-21 LAB — CBC
HCT: 31.8 % — ABNORMAL LOW (ref 36.0–46.0)
Hemoglobin: 10.6 g/dL — ABNORMAL LOW (ref 12.0–15.0)
MCHC: 33.3 g/dL (ref 30.0–36.0)
MCV: 83.1 fL (ref 78.0–100.0)
Platelets: 278 10*3/uL (ref 150–400)
RDW: 14.5 % (ref 11.5–15.5)

## 2010-05-21 LAB — CARDIAC PANEL(CRET KIN+CKTOT+MB+TROPI)
Relative Index: 1.1 (ref 0.0–2.5)
Total CK: 453 U/L — ABNORMAL HIGH (ref 7–177)
Troponin I: 0.01 ng/mL (ref 0.00–0.06)

## 2010-05-21 LAB — RETICULOCYTES
RBC.: 3.68 MIL/uL — ABNORMAL LOW (ref 3.87–5.11)
Retic Ct Pct: 0.8 % (ref 0.4–3.1)

## 2010-05-21 LAB — PROTIME-INR
INR: 1 (ref 0.00–1.49)
Prothrombin Time: 12.8 seconds (ref 11.6–15.2)

## 2010-05-21 LAB — POCT CARDIAC MARKERS
CKMB, poc: 2.8 ng/mL (ref 1.0–8.0)
Troponin i, poc: <0.05 ng/mL (ref 0.00–0.09)
Troponin i, poc: <0.05 ng/mL (ref 0.00–0.09)

## 2010-05-21 LAB — IRON AND TIBC: Saturation Ratios: 9 % — ABNORMAL LOW (ref 20–55)

## 2010-05-21 LAB — RAPID URINE DRUG SCREEN, HOSP PERFORMED
Amphetamines: NOT DETECTED
Barbiturates: NOT DETECTED
Benzodiazepines: NOT DETECTED
Cocaine: POSITIVE — AB
Opiates: NOT DETECTED

## 2010-05-21 LAB — VITAMIN B12: Vitamin B-12: 580 pg/mL (ref 211–911)

## 2010-05-21 LAB — APTT: aPTT: 30 seconds (ref 24–37)

## 2010-05-21 LAB — MAGNESIUM: Magnesium: 2 mg/dL (ref 1.5–2.5)

## 2010-05-21 LAB — FOLATE: Folate: 14.5 ng/mL

## 2010-05-22 LAB — HERPES SIMPLEX VIRUS CULTURE: Culture: NOT DETECTED

## 2010-06-26 NOTE — Group Therapy Note (Signed)
NAME:  Olivia Ewing, DULA NO.:  [id number]   MEDICAL RECORD NO.:  [id number]          PATIENT TYPE:  WOC   LOCATION:  [facility]                   FACILITY:  [facility]   PHYSICIAN:  Dorthula Perfect, MD     DATE OF BIRTH:  [birth year]   DATE OF SERVICE:                                  CLINIC NOTE   This 60 year old black female self referred, gravida 7, para 4, abortive  3 is here today because of her bladder coming out the bottom.  Two years  ago she stopped having menstrual periods.  She has no vaginal bleeding  or spotting.  She does have hot flashes every day.  Her last Pap smear  was 03/2006 and was normal.  Her last mammogram was 2005.  For the past  6 months or so she was noted that her bladder was coming down.  She  has urgency and is not able to completely empty her bladder unless he  leans way far forward.  She has nocturia 3-4 times.  For the past couple  months she has also been having stress incontinence with laughing,  sneezing and coughing.  She did not have any of these problems a year  ago.   FAMILY HISTORY:  Negative.   PAST MEDICAL HISTORY:  She is followed by [facility] for her  blood pressure for which she takes HCTZ.   It should be noted that with her 4 deliveries, all babies weighed 8-9  pounds with her last one weighing 11 pounds.   PHYSICAL EXAMINATION:  VITALS:  Height 5 feet 5 inches, weight 152,  blood pressure 101/71.  ABDOMEN: Slightly obese, soft and nontender.  GU: On pelvic exam, external genitalia is normal.  The vulvar is spread  apart by slightly protruding cystocele.  Inspection of the vagina with a  speculum reveals cervix to be epithelized and normal.  Bimanual exam of  the uterus is of normal size and shape to perhaps a small pear.  Ovaries  are nonpalpable.  RECTOVAGINAL EXAM:  Negative.  She has a minimal rectocele.   Speculum is reinserted and a tenaculum was placed on the cervix.  Contraction on the cervix brings it  down towards the introitus but not  through the introitus.  Tenaculum is removed.  With straining she can  push the cystocele just through the introitus.   IMPRESSION:  Symptomatic cystocele with mild uterine descensus.   disposition  I have discussed with her having a hysterectomy and repair.  She is  leaning this way.  She will come back in 2-4 weeks to discuss this with  Dr. Okey Dupre, Dr. Mia Creek or Dr. Marice Potter whichever one is here in the clinic.  September 04, 2006          ______________________________  Dorthula Perfect, MD    ER/MEDQ  D:  09/04/2006  T:  09/05/2006  Job:  [id number]

## 2010-06-26 NOTE — Discharge Summary (Signed)
[name], Olivia Ewing              ACCOUNT NO.:  [id number]   MEDICAL RECORD NO.:  [id number]          PATIENT TYPE:  INP   LOCATION:  5524                         FACILITY:  [facility]   PHYSICIAN:  Waldemar Dickens, MD     DATE OF BIRTH:  1950-04-25   DATE OF ADMISSION:  07/22/2008  DATE OF DISCHARGE:  07/22/2008                               DISCHARGE SUMMARY   DISCHARGE DIAGNOSES:  1. Hypokalemia.  2. Hypertension.  3. Anemia.   DISCHARGE MEDICATIONS:  The patient is not discharged on any  medications.  She is advised to stop taking hydrochlorothiazide.   DISPOSITION AND FOLLOW UP:  [name] is discharged in stable  condition.  She is to follow up [facility] on July 25, 2008 for lab draw to follow up on her hypokalemia.   PROCEDURE PERFORMED:  None.   CONSULTATIONS:  None.   ADMITTING HISTORY AND PHYSICAL:  [name] is a 60 year old African  American female with past medical history of hypertension who presents  to the emergency room with palpitations, which started 3 days prior to  admission.  She has had no fever, chest pain, shortness of breath,  diarrhea or dysuria.  She has had no syncope.  She denies use of any  illicit substances or alcohol.  She also has complained of muscle  cramping for the past 3 days.   PHYSICAL EXAMINATION:  Temperature 98.2, blood pressure 137/91, pulse  69, oxygen saturation 100% on 2 L.  Exam is benign.  There are no  significant findings.   ADMISSION LABORATORY DATA:  Sodium 140, potassium 2.4, chloride 106,  bicarb 26, BUN 10, creatinine 1.05, and glucose 92.  White blood cells  5.1, hemoglobin 10.6, and platelets 278.  Cardiac enzymes are negative.  Chest X-Ray, no acute cardiopulmonary disease.  Fecal occult blood test  is negative.   HOSPITAL COURSE:  By problem.  1. Hypokalemia, most likely secondary to hydrochlorothiazide.  This      medication has been discontinued.  Potassium was repleted.  Ms.  Olivia Ewing is discharged with a serum potassium of 4.7 later on the      day of admission.  Magnesium was checked on admission was 2.0.      Cardiac enzymes were negative.  EKG was unchanged from previous and      did not show signs related to hypokalemia.  TSH is normal.  Urine      drug screen is positive for cocaine, which may have contributed to      potassium depletion and symptoms of palpitations.  2. Hypertension.  Blood pressure in hospital 120/80s, this should be      followed up in the outpatient setting to see if [name]  does      need to be on any antihypertensives, as at this point her only      antihypertensive hydrochlorothiazide has been held.  3. Anemia.  Anemia panel drawn indicating iron deficiency, this result      was completed after discharge and should be followed up in the      outpatient  setting.  Her iron level is 27, ferritin is moderately      low at 23.  B12 and folate are within normal limits.   DISCHARGE LABORATORY AND VITALS:  On evening of discharge temperature  97.4, blood pressure 123/82, pulse 69, and oxygen saturation 100% on  room air.  Sodium 141, potassium 4.7, chloride 112, bicarb 25, BUN 9,  creatinine 0.84, glucose 89, and calcium 8.8.      Elby Showers, MD  Electronically Signed      Waldemar Dickens, MD  Electronically Signed    CW/MEDQ  D:  09/10/2008  T:  09/11/2008  Job:  [id number]

## 2010-06-26 NOTE — Group Therapy Note (Signed)
NAME:  JEM, CASTRO NO.:  [id number]   MEDICAL RECORD NO.:  [id number]          PATIENT TYPE:  WOC   LOCATION[facility]  [facility]                   [facility]ACILITY:  [facility]   PHYSICIAN:  Ginger Carne, MD DATE OF BIRTH:  Jun 23, 1950   DATE OF SERVICE:  09/25/2006                                  CLINIC NOTE   Ms Nyra Capes is a 60 year old African American multiparous female seen  originally by Dr. Perlie Gold in July 2008.  Her principal complaint was  lower pelvic pain and urinary stress incontinence with bulging from the  vagina.  Her medical history is recorded in her notes.  The patient denies fecal incontinence.  She has no neurological symptoms  and/or takes medications to enhance her propensity to lose urine.  She  denies nocturia or postvoid dribbling.  The patient is menopausal.   SALIENT PHYSICAL FINDINGS:  EXTERNAL GENITALIA:  Vulva and vagina  reveals a third-degree cystocele, first-degree rectocele with second-  degree uterine procidentia.  Her vault appears to be well-supported.  Rectovaginal exam is confirmatory with good tone.   IMPRESSION:  Third-degree cystocele, symptomatic with urinary stress  incontinence and pelvic pain.   PLAN:  The patient will undergo a total vaginal hysterectomy, removal of  both tubes and ovaries, TVT with cystoscopy in addition to uterosacral  ligament vaginal vault suspension.  Ashby Dawes of said procedure discussed  in detail.           ______________________________  Ginger Carne, MD     SHB/MEDQ  D:  09/25/2006  T:  09/26/2006  Job:  [id number]

## 2010-06-26 NOTE — Group Therapy Note (Signed)
NAMEJUDYTHE, Olivia Ewing NO.:  [id number]   MEDICAL RECORD NO.:  [id number]          PATIENT TYPE:  WOC   [facility]ATION[facility]                   [facility]ACILITY:  [facility]   PHYSICIAN:  Karlton Lemon, MD      DATE OF BIRTH:  03/14/1950   DATE OF SERVICE:                                  CLINIC NOTE   CHIEF COMPLAINT:  Surgery followup.   HISTORY OF PRESENT ILLNESS:  This is a 60 year old African-American  female that is status post tension-free vaginal tape procedure with  cystoscopy, total vaginal hysterectomy with preservation of both tubes  and ovaries, uterosacral ligament vaginal vault suspension and anterior  colporrhaphy that presents for followup from the surgery performed on  10/21/06.  The patient states that she is doing fairly well today. She  was seen in the clinic on October 31, 2006, wherein she was treated  for urinary tract infection for three days with ciprofloxacin. The  patient reports that she is currently urinating without difficulty. She  does not note any blood in her urine. She denies any abdominal  tenderness. She is passing gas and stool without difficulty. She is  requesting that she can go back to work with limitations on her lifting.  She denies any sexual activity.   PAST MEDICAL HISTORY:  Hypertension.   PAST SURGICAL HISTORY:  See history of present illness.   MEDICATIONS:  Procardia XL, Vicodin.   ALLERGIES:  No known drug allergies.   PHYSICAL EXAMINATION:  GENERAL: This is a well-appearing African-  American female in no distress.  VITAL SIGNS: Temperature is 98.4. Pulse is 64. Blood pressure is 131/94.  CARDIOVASCULAR: Heart is regular rate and rhythm with no murmurs, rubs  or gallops.  RESPIRATORY:  Lungs are clear to auscultation bilaterally.  ABDOMEN: Soft, nontender to palpation with bowel sounds auscultated in  all quadrants. There is no mass palpated.  No rebound tenderness or  guarding is noted.  GENITOURINARY:   Deferred.  EXTREMITIES:  No tenderness or edema.   ASSESSMENT AND PLAN:  This is a 60 year old female presenting for  postoperative followup of a vaginal tape procedure with cystoscopy,  total vaginal hysterectomy, uterosacral ligament/vaginal vault  suspension and anterior colporrhaphy. The patient is doing well  postoperatively. She will be provided with a note that she can return to  work next week but she may not do any lifting. She is instructed not to  have anything in her vagina until re-evaluation of her vaginal cuff is  performed in three weeks. The patient is to followup in three weeks for  evaluation of her vaginal cuff postoperatively.   The patient has been discussed with Dr. __________           ______________________________  Karlton Lemon, MD     NS/MEDQ  D:  11/06/2006  T:  11/07/2006  Job:  [id number]

## 2010-06-26 NOTE — Op Note (Signed)
Olivia Ewing, Olivia Ewing                ACCOUNT NO.:  [id number]   MEDICAL RECORD NO.:  [id number]          PATIENT TYPE:  OIB   LOCATION:  9317                          FACILITY:  [facility]   PHYSICIAN:  Ginger Carne, MD  DATE OF BIRTH:  1950/07/18   DATE OF PROCEDURE:  10/14/2006  DATE OF DISCHARGE:  09/25/2006                               OPERATIVE REPORT   PREOPERATIVE DIAGNOSIS:  Partial uterovaginal prolapse, third degree  cystocele, and genuine urinary stress incontinence.   POSTOPERATIVE DIAGNOSIS:  Partial uterovaginal prolapse, third degree  cystocele, and genuine urinary stress incontinence.   PROCEDURE:  Tension free vaginal tape procedure with cystoscopy, total  vaginal hysterectomy with preservation of both tubes and ovaries,  uterosacral ligament vaginal vault suspension, and anterior  colporrhaphy.   SURGEON:  Ginger Carne, M.D.   ASSISTANT:  Johnella Moloney, M.D.   ESTIMATED BLOOD LOSS:  200 mL.   COMPLICATIONS:  None immediate.   DISPOSITION OF SPECIMENS:  To pathology.   SPECIMEN:  Uterus and cervix.   ANESTHESIA:  General.   OPERATIVE FINDINGS:  Second degree uterovaginal prolapse with third  degree cystocele.  The patient had a second degree asymptomatic  rectocele.  Both tubes and ovaries were significantly cephalad in  position and it was determined to leave these in situ for safety  reasons.   OPERATIVE PROCEDURE:  The patient was prepped and draped in the usual  fashion and placed in the lithotomy position.  Betadine solution was  used for antiseptic.  The patient was catheterized prior to the  procedure.  After adequate general anesthesia, a tenaculum was placed on  the anterior and posterior lips of the cervix.  2 cm of anterior and  posterior vaginal epithelium were incised transversely.  The peritoneal  reflection was identified and opened without injury to their respective  organs.  Following this, the uterosacral cardinal ligament complexes  were clamped, cut, and ligated with 0 Vicryl suture.  This was in a  standard [employer] fashion.  The uterine vasculature was clamped, cut,  and ligated with 0 Vicryl suture including the broad ligaments.  The  utero-ovarian and round ligaments were similarly clamped, cut, and  ligated with 0 Vicryl suture in a transfixation manner twice.  No  bleeding noted and the cuff was left open to proceed with the anterior  colporrhaphy.  The anterior vaginal epithelium was incised in the  midline beginning at the base of the cuff extending 1 cm from the  external urethral meatus. The pubovesical cervical fascia was dissected  from the vaginal epithelium. Marcaine with epinephrine was injected  prior subepithelially for hemostatic control.  Similarly, the same  solution was used as a paracervical block before the hysterectomy  proper.   An anterior colporrhaphy was then performed using 2-0 Prolene in a  series of interrupted sutures approximating in the midline of the  pubovesical cervical fascia.  In a bottom up technique using [employer], either tape was brought through with a trocar  emanating 1-2 cm laterally to the midline symphysis pubis.  Cystoscopy  was  performed immediately and no injury to respective bladder walls,  dome, posterior aspect of the bladder, or the urethra noted.  Prior to  the anterior colporrhaphy, the uterosacral ligaments on either side were  identified with one finger in the rectum at the 4 and 7 o'clock  positions, 2-0 Prolene was used to go through the ligaments with a set  of two sutures on either side.  These were placed on tension and indigo  carmine dye utilized to assure that the ureters demonstrated good and  free flow.  After this, the TVT was appropriately tensioned, the plastic  sheaths removed, the vaginal epithelium trimmed and closed with 2-0  Monocryl running interlocking suture.   The Prolene sutures were then affixed to the lateral  aspects of the  anterior cuff and posterior cuff and the vaginal cuff was closed with 0  Vicryl running interlocking suture.  The Prolene sutures were then tied  down to bring the vaginal cuff up to the uterosacral ligaments at the  level of the ischial spines.  The tape at the abdominal surface was  trimmed below the skin and Dermabond used for approximation.  The urine  was clear at the end of the procedure. No active bleeding was noted.  The patient tolerated the procedure well and returned to the post  anesthesia recovery room in excellent condition.      Ginger Carne, MD  Electronically Signed     SHB/MEDQ  D:  10/14/2006  T:  10/14/2006  Job:  [id number]

## 2010-06-26 NOTE — Discharge Summary (Signed)
NAMEBRITTAINY, Olivia Ewing                ACCOUNT NO.:  [id number]   MEDICAL RECORD NO.:  [id number]          PATIENT TYPE:  INP   LOCATION:  9317                          [facility]ACILITY:  [facility]   PHYSICIAN:  Ginger Carne, MD  DATE OF BIRTH:  05-29-1950   DATE OF ADMISSION:  10/14/2006  DATE OF DISCHARGE:                               DISCHARGE SUMMARY   REASON FOR HOSPITALIZATION:  1. Partial ureterovaginal prolapse.  2. Third-degree cystocele.  3. Genuine urinary stress incontinence.   HOSPITAL PROCEDURES:  Tension free vaginal tape procedure with  cystoscopy, total vaginal hysterectomy with preservation of both tubes  and ovaries, ureterosacral ligament/vaginal vault suspension and  anterior colporrhaphy.   FINAL DIAGNOSES:  1. Partial ureterovaginal prolapse.  2. Third-degree cystocele.  3. Genuine urinary stress incontinence.   HOSPITAL COURSE:  This is a 60 year old African-American female who  underwent the above surgery on the 2nd of September 2008.  Intraoperative course was uneventful, and postoperative course was  unremarkable.  She was afebrile.  Her hemoglobin postoperatively was  10.5 and creatinine 0.72.  Abdomen was soft.  Positive bowel sounds.  Calves without tenderness.  Lungs were clear.  She had scant vaginal  flow.   Her Foley catheter was removed on October 16, 2006, on the morning of  discharge.  She had voided adequately with residual urine volume check  of 30 mL by catheterized urine.  The patient was given routine  postoperative instructions including contacting the office for  temperature elevation above 100.4 degrees Fahrenheit, increasing  abdominal discomfort, vaginal bleeding or drainage, GI or GU complaints.  Timed voids were discussed with the patient every four hours for the  next four weeks.  She was prescribed platelet count XL 30 mg daily,  Vicodin 1-2 every six hours for pain, and Cipro 500 mg twice a day for  five days.  She will return to the  GYN clinic in four weeks for her  routine followup visit.  All questions answered to the satisfaction of  the patient, and patient verbalized understanding of same.      Ginger Carne, MD  Electronically Signed     SHB/MEDQ  D:  10/16/2006  T:  10/16/2006  Job:  [id number]

## 2010-06-26 NOTE — Group Therapy Note (Signed)
[name], ZIPP NO.:  [id number]   MEDICAL RECORD NO.:  [id number]          PATIENT TYPE:  WOC   LOCATION:  [facility]                   FACILITY:  [facility]   PHYSICIAN:  Elsie Lincoln, MD      DATE OF BIRTH:  1950/11/13   DATE OF SERVICE:  10/31/2006                                  CLINIC NOTE   The patient is a 60 year old African-American woman who is here for  dysuria and lower abdominal pain.  The patient had a hysterectomy  oophorectomy, and some kind of surgery for correction of a cystocele  back on October 15, 2006.  She said that she has been having some  dysuria, no blood in her urine, and some lower abdominal pain that  started the last couple of days, and has gotten worse.  No fevers,  nausea or vomiting.  She says she feels like she has a urinary tract  infection.  Also feeling that she has to strain to use the restroom.   OBJECTIVE:  Temperature is 97.0, pulse 68.  GENERAL:  Alert and in no acute distress.  ABDOMEN:  Soft, nondistended.  Extremely tender in the lower abdominal  quadrants.   A post-void residual was done and showed about 25 mL of urine left in  her bladder.  Labs include a UA showing a small amount of blood.  The  patient is status post hysterectomy very recently, and a small amount of  leukocyte esterase, 0-2 white blood cells, 0-2 red blood cells.  We will  send this for a urine culture.   ASSESSMENT AND PLAN:  This is a 60 year old status post surgery on  September 3 for hysterectomy, oophorectomy, and uterosacral ligament  vaginal vault distension here with likely urinary tract infection.  Post  void residual was done to rule out issues with her surgery.  It appears  that she does have a urinary tract infection.  We will treat her with 3  days of Cipro b.i.d. 100 mg and have her return for her post-op  appointment, which is about a week from now, to see how she is doing.     ______________________________  Alanda Amass    ______________________________  Elsie Lincoln, MD    JH/MEDQ  D:  10/31/2006  T:  10/31/2006  Job:  [id number]

## 2010-07-04 ENCOUNTER — Encounter: Payer: Self-pay | Admitting: Internal Medicine

## 2010-09-10 ENCOUNTER — Ambulatory Visit (INDEPENDENT_AMBULATORY_CARE_PROVIDER_SITE_OTHER): Payer: Self-pay

## 2010-09-10 ENCOUNTER — Inpatient Hospital Stay (INDEPENDENT_AMBULATORY_CARE_PROVIDER_SITE_OTHER)
Admission: RE | Admit: 2010-09-10 | Discharge: 2010-09-10 | Disposition: A | Discharge status: Home / Self Care | Payer: Self-pay | Source: Ambulatory Visit | Attending: Family Medicine | Admitting: Family Medicine

## 2010-09-10 DIAGNOSIS — M1990 Unspecified osteoarthritis, unspecified site: Secondary | ICD-10-CM

## 2010-09-13 ENCOUNTER — Other Ambulatory Visit: Payer: Self-pay | Admitting: *Deleted

## 2010-09-13 MED ORDER — LISINOPRIL 20 MG PO TABS: 20.0000 mg | ORAL_TABLET | Freq: Every day | ORAL | Status: DC

## 2010-09-23 ENCOUNTER — Emergency Department (HOSPITAL_COMMUNITY): Payer: Self-pay

## 2010-09-23 ENCOUNTER — Emergency Department (HOSPITAL_COMMUNITY)
Admission: EM | Admit: 2010-09-23 | Discharge: 2010-09-23 | Disposition: A | Discharge status: Home / Self Care | Payer: Self-pay | Attending: Emergency Medicine | Admitting: Emergency Medicine

## 2010-09-23 DIAGNOSIS — M79609 Pain in unspecified limb: Secondary | ICD-10-CM | POA: Insufficient documentation

## 2010-09-23 DIAGNOSIS — Y92009 Unspecified place in unspecified non-institutional (private) residence as the place of occurrence of the external cause: Secondary | ICD-10-CM | POA: Insufficient documentation

## 2010-09-23 DIAGNOSIS — S9030XA Contusion of unspecified foot, initial encounter: Secondary | ICD-10-CM | POA: Insufficient documentation

## 2010-09-23 DIAGNOSIS — W010XXA Fall on same level from slipping, tripping and stumbling without subsequent striking against object, initial encounter: Secondary | ICD-10-CM | POA: Insufficient documentation

## 2010-09-23 DIAGNOSIS — I10 Essential (primary) hypertension: Secondary | ICD-10-CM | POA: Insufficient documentation

## 2010-09-23 DIAGNOSIS — M549 Dorsalgia, unspecified: Secondary | ICD-10-CM | POA: Insufficient documentation

## 2010-09-23 DIAGNOSIS — M25579 Pain in unspecified ankle and joints of unspecified foot: Secondary | ICD-10-CM | POA: Insufficient documentation

## 2010-09-23 DIAGNOSIS — Z79899 Other long term (current) drug therapy: Secondary | ICD-10-CM | POA: Insufficient documentation

## 2010-09-23 DIAGNOSIS — G8929 Other chronic pain: Secondary | ICD-10-CM | POA: Insufficient documentation

## 2010-09-23 DIAGNOSIS — E7800 Pure hypercholesterolemia, unspecified: Secondary | ICD-10-CM | POA: Insufficient documentation

## 2010-09-23 DIAGNOSIS — M25569 Pain in unspecified knee: Secondary | ICD-10-CM | POA: Insufficient documentation

## 2010-09-23 DIAGNOSIS — IMO0002 Reserved for concepts with insufficient information to code with codable children: Secondary | ICD-10-CM | POA: Insufficient documentation

## 2010-10-18 ENCOUNTER — Encounter: Payer: Self-pay | Admitting: Internal Medicine

## 2010-10-18 ENCOUNTER — Ambulatory Visit (INDEPENDENT_AMBULATORY_CARE_PROVIDER_SITE_OTHER): Payer: Self-pay | Admitting: Internal Medicine

## 2010-10-18 VITALS — BP 118/83 | HR 76 | Temp 98.8°F | Wt 170.0 lb

## 2010-10-18 DIAGNOSIS — M549 Dorsalgia, unspecified: Secondary | ICD-10-CM

## 2010-10-18 DIAGNOSIS — M25561 Pain in right knee: Secondary | ICD-10-CM

## 2010-10-18 DIAGNOSIS — K219 Gastro-esophageal reflux disease without esophagitis: Secondary | ICD-10-CM

## 2010-10-18 DIAGNOSIS — N393 Stress incontinence (female) (male): Secondary | ICD-10-CM | POA: Insufficient documentation

## 2010-10-18 DIAGNOSIS — M25569 Pain in unspecified knee: Secondary | ICD-10-CM

## 2010-10-18 DIAGNOSIS — I10 Essential (primary) hypertension: Secondary | ICD-10-CM

## 2010-10-18 DIAGNOSIS — M79604 Pain in right leg: Secondary | ICD-10-CM | POA: Insufficient documentation

## 2010-10-18 DIAGNOSIS — R0600 Dyspnea, unspecified: Secondary | ICD-10-CM

## 2010-10-18 DIAGNOSIS — IMO0002 Reserved for concepts with insufficient information to code with codable children: Secondary | ICD-10-CM

## 2010-10-18 DIAGNOSIS — Z23 Encounter for immunization: Secondary | ICD-10-CM

## 2010-10-18 DIAGNOSIS — N8111 Cystocele, midline: Secondary | ICD-10-CM

## 2010-10-18 DIAGNOSIS — Z119 Encounter for screening for infectious and parasitic diseases, unspecified: Secondary | ICD-10-CM

## 2010-10-18 DIAGNOSIS — R3981 Functional urinary incontinence: Secondary | ICD-10-CM | POA: Insufficient documentation

## 2010-10-18 DIAGNOSIS — E785 Hyperlipidemia, unspecified: Secondary | ICD-10-CM

## 2010-10-18 LAB — URINALYSIS, ROUTINE W REFLEX MICROSCOPIC
Hgb urine dipstick: NEGATIVE
Leukocytes, UA: NEGATIVE
Protein, ur: NEGATIVE mg/dL
Urobilinogen, UA: 0.2 mg/dL (ref 0.0–1.0)

## 2010-10-18 LAB — POCT URINALYSIS DIPSTICK
Glucose, UA: NEGATIVE
Nitrite, UA: NEGATIVE
Protein, UA: NEGATIVE
Spec Grav, UA: 1.015
Urobilinogen, UA: 0.2

## 2010-10-18 MED ORDER — ALBUTEROL SULFATE HFA 108 (90 BASE) MCG/ACT IN AERS: 2.0000 | INHALATION_SPRAY | RESPIRATORY_TRACT | Status: DC | PRN

## 2010-10-18 MED ORDER — HYDROCODONE-ACETAMINOPHEN 5-500 MG PO TABS: 1.0000 | ORAL_TABLET | Freq: Four times a day (QID) | ORAL | Status: AC | PRN

## 2010-10-18 MED ORDER — PRAVASTATIN SODIUM 20 MG PO TABS: 20.0000 mg | ORAL_TABLET | Freq: Every day | ORAL | Status: DC

## 2010-10-18 MED ORDER — OMEPRAZOLE 20 MG PO CPDR: 20.0000 mg | DELAYED_RELEASE_CAPSULE | Freq: Every day | ORAL | Status: DC

## 2010-10-18 NOTE — Assessment & Plan Note (Addendum)
As described in history of present illness, patient twisted her right knee in July 2012 and since then has trouble walking uphill and downhill. No signs of infection and joint. She wanted to see an orthopedic doctor, but I recommended her to be seen by sports medicine clinic for this particular issue as she would likely get better management and evaluation there. I will refer her to sports medicine clinic for further evaluation and management of her right knee giving up while walking up hill and downhill.

## 2010-10-18 NOTE — Assessment & Plan Note (Signed)
Blood pressure 118/83. At goal. Continue lisinopril at same dose. No changes today.

## 2010-10-18 NOTE — Progress Notes (Signed)
  Subjective:    [name], female    DOB: [birth year], 60 y.o.   MRN: [id number]  HPI Ms. Muto is a pleasant 60 year woman with past with history of hypertension, partial ureterovaginal prolapse and third degree cystocele, repaired in 2008- consult the clinic with complaints of right low back pain and right knee trouble with walking. She C/o severe R low back pain which goes to leteral right thigh for about a week now. She says that the pain is pressure-like and it is coming from her prolapse for which she had surgery and has sling placed. She also describes the pain as the baby pushing down during labor. She also has recent trouble urinating, has to bend forward to empty her bladder. She does not complain of any increased frequency or burning during urination. At the end of July 2012, she was walking her dog and twisted her right leg and almost fell down, went to the ED and x-rays did not show any fracture-although she received creep bandage- which he has in place today. Since then she complains of trouble walking uphill and downhill, in terms of right knee giving up. She is a complaints of some right knee pain while walking, but not at rest. No significant tenderness to palpation of warmth or redness of right knee.  She denies any fever, chills headache, abdominal pain, chest pain, short of breath, nausea, vomiting.    Review of Systems    as per history of present illness, all other systems reviewed and negative. Objective:   Physical Exam Constitutional: Vital signs reviewed.  Patient is a well-developed and well-nourished in no acute distress and cooperative with exam. Alert and oriented x3.  Head: Normocephalic and atraumatic Mouth: no erythema or exudates, MMM Eyes: PERRL, EOMI, conjunctivae normal, No scleral icterus.  Neck: Supple, Trachea midline normal ROM, No JVD. Cardiovascular: RRR, S1 normal, S2 normal, no MRG, pulses symmetric and intact  bilaterally Pulmonary/Chest: CTAB, no wheezes, rales, or rhonchi Abdominal: Soft. Non-tender, non-distended, bowel sounds are normal, no masses, organomegaly, or guarding present.  Musculoskeletal: No joint deformities. Right knee bandage in place, no tenderness to palpation, no significant redness or warmth. SLR negative bilaterally.  Neurological: A&O x3, Strenght is normal and symmetric bilaterally, cranial nerve II-XII are grossly intact, no focal motor deficit, sensory intact to light touch bilaterally.  Skin: Warm, dry and intact. No rash, cyanosis, or clubbing.          Assessment & Plan:

## 2010-10-18 NOTE — Assessment & Plan Note (Signed)
She has recent trouble with emptying the bladder-she is to bend forward to do that and cannot sit on toilet seat and micturate. She also complains of right low back pain going to the right lateral thigh and as per history of present illness, it seems to be pain referred from the prolapse issue. I recommended her to make an appointment with OB/GYN Dr. and have her followup as soon as possible-she sounded understanding. She was complaining of uncontrollable pain and Tylenol and Advil does not help it. She also does not get help with tramadol. I would just give her supply of 30 tablets of Vicodin 5/500 for breakthrough pain until she sees her OB/GYN doctor.

## 2010-10-18 NOTE — Progress Notes (Signed)
Addended by: Maura Crandall on: 10/18/2010 04:34 PM   Modules accepted: Orders

## 2010-10-18 NOTE — Patient Instructions (Signed)
Please followup with your primary care doctor with the upcoming appointment. Please see your OB/GYN doctor as soon as possible for the issues you are having with bladder. Also the sports medicine clinic will call with appointment day and time and please follow up with them for her right knee problem and follow the recommendations. I will give you 30 tablets of Vicodin today to help your pain until you see your OB/GYN doctor.

## 2010-10-18 NOTE — Assessment & Plan Note (Signed)
Continue Pravachol. Needs fasting lipid panel checked-not fasting today. Advised her to come fasting next visit to get the test done.

## 2010-10-20 LAB — URINE CULTURE: Colony Count: NO GROWTH

## 2010-10-25 ENCOUNTER — Ambulatory Visit: Payer: Self-pay | Admitting: Family Medicine

## 2010-11-02 ENCOUNTER — Ambulatory Visit (INDEPENDENT_AMBULATORY_CARE_PROVIDER_SITE_OTHER): Payer: Self-pay | Admitting: Internal Medicine

## 2010-11-02 ENCOUNTER — Encounter: Payer: Self-pay | Admitting: Internal Medicine

## 2010-11-02 VITALS — BP 112/82 | HR 69 | Temp 100.2°F | Wt 167.5 lb

## 2010-11-02 DIAGNOSIS — K0889 Other specified disorders of teeth and supporting structures: Secondary | ICD-10-CM

## 2010-11-02 DIAGNOSIS — K089 Disorder of teeth and supporting structures, unspecified: Secondary | ICD-10-CM

## 2010-11-02 MED ORDER — IBUPROFEN 600 MG PO TABS: 600.0000 mg | ORAL_TABLET | Freq: Three times a day (TID) | ORAL | Status: DC | PRN

## 2010-11-02 MED ORDER — AMOXICILLIN 500 MG PO CAPS: 500.0000 mg | ORAL_CAPSULE | Freq: Two times a day (BID) | ORAL | Status: DC

## 2010-11-02 NOTE — Progress Notes (Signed)
Subjective:    [name], female    DOB: August 10, 1950, 60 y.o.   MRN: [id number]  HPI: The patient is a 60 year old female comes in today with a one-month history of broken tooth. She states the pain has gotten increasingly worse over the last week. She states that she hasn't felt like she's had a fever or chills at home. She just has excruciating pain in the tooth which does occasionally radiate up into the eye. It is on the right side of her mouth on the upper jaw. She states that she has been able unable to get a dentist due to cost. She is going back to school right now for her GED. I did congratulate her and encourage her with that she seems like she is in a great mood. She was recently started on Prozac 4 weeks ago for mental health she will go back to see them in a couple of weeks to adjust dosing and see how she's doing on the medicine. She has no other complaints at today's visit. She states that she does have some redness around the gum area surrounding the tooth that is broken. She has been trying to avoid chewing on that side due to pain. She does have orange card.  Review of Systems  Constitutional: Negative.  Negative for fever, chills and unexpected weight change.  HENT: Positive for dental problem. Negative for hearing loss, ear pain, nosebleeds, congestion, sore throat, facial swelling, rhinorrhea, sneezing, drooling, mouth sores, trouble swallowing, neck pain, neck stiffness, voice change, postnasal drip, sinus pressure and ear discharge.        She does have a broken tooth.  Eyes: Negative.   Respiratory: Negative.  Negative for apnea, cough, choking, chest tightness, shortness of breath, wheezing and stridor.   Cardiovascular: Negative.  Negative for chest pain, palpitations and leg swelling.  Gastrointestinal: Negative.  Negative for nausea, vomiting, abdominal pain, diarrhea and constipation.  Musculoskeletal: Negative.   Skin: Negative.   Neurological:  Negative for dizziness, tremors, seizures, syncope, facial asymmetry, speech difficulty, weakness, light-headedness, numbness and headaches.  Hematological: Negative.   Psychiatric/Behavioral: Negative.     Vitals: Blood pressure: 112/82 Pulse: 69 Temperature: 100.20F Weight 167 pounds    Objective:   Physical Exam  Constitutional: She is oriented to person, place, and time. She appears well-developed and well-nourished.  HENT:  Head: Normocephalic and atraumatic.       Patient does have a broken tooth on the top right jaw she also has several missing missing molars on that side. There is some surrounding erythema that does extend to the tooth closest to it on either side. There is no swelling in the surrounding area or cheek.  Eyes: EOM are normal. Pupils are equal, round, and reactive to light.  Neck: Normal range of motion. Neck supple. No tracheal deviation present. No thyromegaly present.  Cardiovascular: Normal rate, regular rhythm and normal heart sounds.   Pulmonary/Chest: Effort normal and breath sounds normal. No stridor. No respiratory distress. She has no wheezes. She has no rales.  Abdominal: Soft. Bowel sounds are normal. She exhibits no distension. There is no tenderness. There is no rebound and no guarding.  Musculoskeletal: Normal range of motion. She exhibits no edema and no tenderness.  Lymphadenopathy:    She has no cervical adenopathy.  Neurological: She is alert and oriented to person, place, and time. No cranial nerve deficit.  Skin: Skin is warm and dry. No rash noted. No erythema. No pallor.  Psychiatric: She has a normal mood and affect. Her behavior is normal. Judgment and thought content normal.          Assessment & Plan:  1. Dental pain-patient does come in with one broken tooth that has been causing her increasing pain over the last week. She does have some erythema around the area suggesting that she may have some transient bacteremia going on. She  does have a close to febrile temperature so we will treat with antibiotics and pain medication. We will give her amoxicillin 500 mg to be taken twice daily for the next 2 weeks or until she can get this tooth pulled. Since she does have orange card we will be able to make her dental appointment within the next 2 weeks. I also gave her ibuprofen 600 mg be taken up to 3 times daily for the pain. If she has worsening of the pain or is not able to get into the dental clinic she can call us back for repeat appointment. We will see her back as needed.  2. Right knee pain-patient was referred to sports medicine at last visit. I did inform her that once she does get an appointment we will call her with that information and she was happy to hear that.  3. Other medical problems not addressed at today's visit-hyperlipidemia, hypokalemia, anemia, tinnitus, hypertension, shoulder pain, neck pain, headache, dysaphia, cystocele.  4. Disposition-patient will be seen back as needed. We did give her a two-week supply of amoxicillin and ibuprofen. If she is not able to get seen in the dental clinic she will call us back for a repeat appointment. Otherwise no changes to her medications at today's visit.

## 2010-11-02 NOTE — Patient Instructions (Signed)
You were seen today for a broken tooth that may be infected. We will give you an antibiotic, called amoxicillin. Take it twice a day until you go to the dental clinic. We are also giving you a prescription for ibuprofen prescription strength that you can take three times a day for pain. You can call our clinic if you have any questions or problems. Our number is [phone].

## 2010-11-08 ENCOUNTER — Other Ambulatory Visit: Payer: Self-pay | Admitting: Internal Medicine

## 2010-11-08 DIAGNOSIS — M25561 Pain in right knee: Secondary | ICD-10-CM

## 2010-11-08 MED ORDER — HYDROCODONE-ACETAMINOPHEN 5-500 MG PO TABS: 1.0000 | ORAL_TABLET | Freq: Four times a day (QID) | ORAL | Status: DC | PRN

## 2010-11-21 ENCOUNTER — Encounter: Payer: Self-pay | Admitting: Internal Medicine

## 2010-11-21 ENCOUNTER — Ambulatory Visit (INDEPENDENT_AMBULATORY_CARE_PROVIDER_SITE_OTHER): Payer: Self-pay | Admitting: Internal Medicine

## 2010-11-21 VITALS — BP 122/81 | HR 79 | Temp 98.6°F | Wt 171.8 lb

## 2010-11-21 DIAGNOSIS — M25569 Pain in unspecified knee: Secondary | ICD-10-CM

## 2010-11-21 DIAGNOSIS — Z0000 Encounter for general adult medical examination without abnormal findings: Secondary | ICD-10-CM

## 2010-11-21 DIAGNOSIS — M1710 Unilateral primary osteoarthritis, unspecified knee: Secondary | ICD-10-CM

## 2010-11-21 DIAGNOSIS — Z1231 Encounter for screening mammogram for malignant neoplasm of breast: Secondary | ICD-10-CM

## 2010-11-21 DIAGNOSIS — M898X9 Other specified disorders of bone, unspecified site: Secondary | ICD-10-CM

## 2010-11-21 DIAGNOSIS — M25561 Pain in right knee: Secondary | ICD-10-CM

## 2010-11-21 DIAGNOSIS — M1711 Unilateral primary osteoarthritis, right knee: Secondary | ICD-10-CM

## 2010-11-21 DIAGNOSIS — Z23 Encounter for immunization: Secondary | ICD-10-CM

## 2010-11-21 DIAGNOSIS — D649 Anemia, unspecified: Secondary | ICD-10-CM

## 2010-11-21 DIAGNOSIS — M949 Disorder of cartilage, unspecified: Secondary | ICD-10-CM

## 2010-11-21 LAB — CBC
HCT: 33.7 % — ABNORMAL LOW (ref 36.0–46.0)
Hemoglobin: 10.6 g/dL — ABNORMAL LOW (ref 12.0–15.0)
MCH: 26.8 pg (ref 26.0–34.0)
MCV: 85.3 fL (ref 78.0–100.0)
RBC: 3.95 MIL/uL (ref 3.87–5.11)
WBC: 5.2 10*3/uL (ref 4.0–10.5)

## 2010-11-21 MED ORDER — HYDROCODONE-ACETAMINOPHEN 5-500 MG PO TABS: 1.0000 | ORAL_TABLET | Freq: Four times a day (QID) | ORAL | Status: AC | PRN

## 2010-11-21 MED ORDER — MELOXICAM 7.5 MG PO TABS: 15.0000 mg | ORAL_TABLET | Freq: Every day | ORAL | Status: DC

## 2010-11-21 MED ORDER — QUETIAPINE FUMARATE 50 MG PO TABS: 50.0000 mg | ORAL_TABLET | Freq: Every day | ORAL | Status: DC

## 2010-11-21 NOTE — Progress Notes (Signed)
  Subjective:    [name], female    DOB: 18-Dec-1950, 60 y.o.   MRN: [id number]  HPI 1. Right knee pain. Patient states that she never filled in Rx for Vicodin because misplaced. However, she did find it while being in the office today. Now has "an orange card" ->requests a referral to sports medicine/PT.  2. C/o burning bone pain "all over." Deneis any fever, chills, sweats, weight change, adenopathy, or changes in her mood. No personal or family Hx of malignancies.  Review of Systems     Objective:   Physical Exam  Vitals: reviewed General: alert, well-developed, and cooperative to examination.  Head: normocephalic and atraumatic.  Eyes: vision grossly intact, pupils equal, pupils round, pupils reactive to light, no injection and anicteric.  Mouth: pharynx pink and moist, no erythema, and no exudates.  Neck: supple, full ROM, no thyromegaly, no JVD, and no carotid bruits.  Lungs: normal respiratory effort, no accessory muscle use, normal breath sounds, no crackles, and no wheezes. Heart: normal rate, regular rhythm, no murmur, no gallop, and no rub.  Abdomen: soft, non-tender, normal bowel sounds, no distention, no guarding, no rebound tenderness, no hepatomegaly, and no splenomegaly.  Msk:  Right knee joint  With mild swelling and valgus; crepitus and pain with flexion and exension, no joint warmth, and no redness over joints.  Pulses: 2+ DP/PT pulses bilaterally Extremities: No cyanosis, clubbing, edema Neurologic: alert & oriented X3, cranial nerves II-XII intact, strength normal in all extremities, sensation intact to light touch, and gait normal.  Skin: turgor normal and no rashes.  Psych: Oriented X3, memory intact for recent and remote, normally interactive, good eye contact, not anxious appearing, and not depressed appearing.        Assessment & Plan:  1. Right knee OA -discouraged from taking vicodin (patient has a Rx for 20 tablets Rx-ed by Dr. Lyn Hollingshead on 11/08/10). Rationale explained to the patient and she verbalized understanding. -Advised to try Meloxicam 15 mg daily PRN for pain first. -Pain contract obtained today. -referred to Sports medicine  2. Bone pain -will check Vit D level -bone Dexa (surgical menopause approximately 10 ) and FMHx of osteoporosis.  3. Hx of anemia. ?Etiology -peripheral smear -ferritin -CBC  Follow up on as needed basis and call in 1-2 days for lab results.

## 2010-11-21 NOTE — Patient Instructions (Signed)
Please, pick up a new prescription for pain at your [facility] pharmacy. Please, follow up with a sports medicine referral. You may call our office in 1-2 days for lab results. Please, follow up with a mammogram. Follow up on as needed basis.

## 2010-11-22 LAB — DRUGS OF ABUSE SCREEN W/O ALC, ROUTINE URINE
Amphetamine Screen, Ur: NEGATIVE
Benzodiazepines.: NEGATIVE
Marijuana Metabolite: NEGATIVE
Methadone: NEGATIVE
Opiate Screen, Urine: NEGATIVE
Phencyclidine (PCP): NEGATIVE

## 2010-11-23 ENCOUNTER — Encounter: Payer: Self-pay | Admitting: Internal Medicine

## 2010-11-23 LAB — CBC
HCT: 31.4 — ABNORMAL LOW
Hemoglobin: 10.5 — ABNORMAL LOW
MCHC: 33.3
Platelets: 249
Platelets: 268
RDW: 13.6
WBC: 4.6

## 2010-11-23 LAB — BASIC METABOLIC PANEL
BUN: 2 — ABNORMAL LOW
BUN: 5 — ABNORMAL LOW
BUN: 9
Calcium: 8.9
Calcium: 9.3
Creatinine, Ser: 0.72
Creatinine, Ser: 0.8
GFR calc Af Amer: >60
GFR calc Af Amer: >60
GFR calc non Af Amer: >60
GFR calc non Af Amer: >60
GFR calc non Af Amer: >60
Glucose, Bld: 102 — ABNORMAL HIGH
Sodium: 135

## 2010-11-23 LAB — VITAMIN D 1,25 DIHYDROXY: Vitamin D3 1, 25 (OH)2: 53 pg/mL

## 2010-11-23 LAB — ABO/RH: ABO/RH(D): A NEG

## 2010-11-23 LAB — TYPE AND SCREEN: ABO/RH(D): A NEG

## 2010-11-26 ENCOUNTER — Encounter: Payer: Self-pay | Admitting: Internal Medicine

## 2010-11-27 LAB — COCAINE, URINE, CONFIRMATION: Benzoylecgonine GC/MS Conf: 15634 NG/ML — ABNORMAL HIGH

## 2010-11-28 ENCOUNTER — Other Ambulatory Visit: Payer: Self-pay | Admitting: Internal Medicine

## 2010-11-28 ENCOUNTER — Telehealth: Payer: Self-pay | Admitting: *Deleted

## 2010-11-28 MED ORDER — AMOXICILLIN 500 MG PO CAPS: 500.0000 mg | ORAL_CAPSULE | Freq: Two times a day (BID) | ORAL | Status: AC

## 2010-11-28 NOTE — Telephone Encounter (Signed)
Pt called stating she has an appointment at dental clinic on 12/11. She was seen here on 9/21 for broken tooth and was given 14 days of Amoxil. She states her face is starting to swell again. Do you want her seen or do you want to give her another run of antibiotics? Pt # [phone]

## 2010-11-29 ENCOUNTER — Ambulatory Visit: Payer: Self-pay | Admitting: Family Medicine

## 2010-11-29 ENCOUNTER — Ambulatory Visit (INDEPENDENT_AMBULATORY_CARE_PROVIDER_SITE_OTHER): Payer: Self-pay | Admitting: Family Medicine

## 2010-11-29 ENCOUNTER — Encounter: Payer: Self-pay | Admitting: Family Medicine

## 2010-11-29 VITALS — BP 133/89 | HR 71 | Ht 65.0 in | Wt 170.0 lb

## 2010-11-29 DIAGNOSIS — M25569 Pain in unspecified knee: Secondary | ICD-10-CM

## 2010-11-29 DIAGNOSIS — M25561 Pain in right knee: Secondary | ICD-10-CM

## 2010-11-29 MED ORDER — MELOXICAM 15 MG PO TABS: 15.0000 mg | ORAL_TABLET | Freq: Every day | ORAL | Status: AC

## 2010-11-29 NOTE — Patient Instructions (Signed)
Mobic 1 tablet a day Ice massage twice a day for 20 min Knee strengthening exercise Glucosamine and chondroitin sulfate 1500 mg. F/U in 4 weeks.

## 2010-11-29 NOTE — Progress Notes (Signed)
Subjective:    [name], female    DOB: 10-08-1950, 60 y.o.   MRN: [id number]  HPI  Olivia Ewing is a pleasant 60 yo female patient complaining of a right knee pain for the last month. She states that she was jerk by a dog and after she developed the knee pain. The pain is located in the anterior medial aspect of her knee, 5/10 intensity, on and off, not radiated. Also pain walking down hill.No numbness or tingling. She had right knee x ray, not standing films that showed mild DJD. She has taken motrin with no much improvement.  Patient Active Problem List  Diagnoses  . HYPERLIPIDEMIA, MILD  . HYPOKALEMIA  . ANEMIA, NORMOCYTIC  . TINNITUS  . HYPERTENSION  . SHOULDER PAIN, RIGHT  . NECK PAIN, CHRONIC  . HEADACHE  . DYSPNEA  . Cystocele  . Right knee pain   Current Outpatient Prescriptions on File Prior to Visit  Medication Sig Dispense Refill  . albuterol (PROVENTIL HFA) 108 (90 BASE) MCG/ACT inhaler Inhale 2 puffs into the lungs every 4 (four) hours as needed.  1 Inhaler  3  . amoxicillin (AMOXIL) 500 MG capsule Take 1 capsule (500 mg total) by mouth 2 (two) times daily.  28 capsule  0  . cetirizine (ZYRTEC) 10 MG tablet Take 10 mg by mouth daily.        Marland Kitchen FLUoxetine (PROZAC) 20 MG capsule Take 20 mg by mouth daily.        Marland Kitchen gabapentin (NEURONTIN) 400 MG capsule Take 400 mg by mouth. 1 capsule in am and 2 capsule in pm.       . HYDROcodone-acetaminophen (VICODIN) 5-500 MG per tablet Take 1 tablet by mouth every 6 (six) hours as needed for pain.  20 tablet  0  . lisinopril (PRINIVIL,ZESTRIL) 20 MG tablet Take 1 tablet (20 mg total) by mouth daily.  30 tablet  11  . omeprazole (PRILOSEC) 20 MG capsule Take 1 capsule (20 mg total) by mouth daily.  30 capsule  5  . pravastatin (PRAVACHOL) 20 MG tablet Take 1 tablet (20 mg total) by mouth daily.  30 tablet  5  . QUEtiapine (SEROQUEL) 50 MG tablet Take 1 tablet (50 mg total) by mouth at bedtime.  30 tablet  0   No  Known Allergies      Review of Systems  Constitutional: Negative for fever, chills, diaphoresis and fatigue.  Musculoskeletal: Negative for back pain, joint swelling and gait problem.  Neurological: Negative for weakness and numbness.       Objective:   Physical Exam  Constitutional: She is oriented to person, place, and time. She appears well-developed and well-nourished.       BP 133/89  Pulse 71  Ht 5\' 5"  (1.651 m)  Wt 170 lb (77.111 kg)  BMI 28.29 kg/m2   Pulmonary/Chest: Effort normal.  Musculoskeletal:       Right knee with intact skin, FROM. Patellofemoral crepitus present with flexion and extension. Patellofemoral compression  test +. No tenderness on the quad neither or patellar tendon. Ligaments intact. Lachman neg. Varus and valgus test at 0 and 30 degres neg TTP in mid joint line. Mc murray negative. Poor quad muscle definition.    Neurological: She is alert and oriented to person, place, and time.  Skin: Skin is warm. No rash noted. No erythema. No pallor.  Psychiatric: She has a normal mood and affect.    Knee Injection:  After obtaining consent,  the skin of the medial right knee was sterilely prepped with alcohol swabs, ethyl will chloride was used for local topical anesthesia injection of 4 mL 1% lidocaine plus one mL of Kenalog was injected intraarticular in the right knee. The procedure was well-tolerated by the patient. Patient instructed to remain in clinic for 20 minutes afterwards, and to report any adverse reaction to me immediately.       Assessment & Plan:    1. Right knee pain    Mobic 1 tablet a day Ice massage twice a day for 20 min Knee strengthening exercise Glucosamine and chondroitin sulfate 1500 mg. F/U in 4 weeks.

## 2010-11-30 NOTE — Telephone Encounter (Signed)
Antibiotic ordered and called into pharmacy.  Have tried to call pt X3 but phone not working.

## 2010-12-06 ENCOUNTER — Ambulatory Visit (HOSPITAL_COMMUNITY): Admission: RE | Admit: 2010-12-06 | Payer: Self-pay | Source: Ambulatory Visit

## 2010-12-06 ENCOUNTER — Ambulatory Visit (HOSPITAL_COMMUNITY): Payer: No Typology Code available for payment source | Attending: Internal Medicine

## 2010-12-21 ENCOUNTER — Ambulatory Visit (HOSPITAL_COMMUNITY)
Admission: RE | Admit: 2010-12-21 | Discharge: 2010-12-21 | Disposition: A | Discharge status: Home / Self Care | Payer: No Typology Code available for payment source | Source: Ambulatory Visit | Attending: Internal Medicine | Admitting: Internal Medicine

## 2010-12-21 DIAGNOSIS — D649 Anemia, unspecified: Secondary | ICD-10-CM

## 2010-12-21 DIAGNOSIS — M1711 Unilateral primary osteoarthritis, right knee: Secondary | ICD-10-CM

## 2010-12-21 DIAGNOSIS — M898X9 Other specified disorders of bone, unspecified site: Secondary | ICD-10-CM

## 2010-12-21 DIAGNOSIS — M25561 Pain in right knee: Secondary | ICD-10-CM

## 2010-12-21 DIAGNOSIS — Z0000 Encounter for general adult medical examination without abnormal findings: Secondary | ICD-10-CM

## 2010-12-21 DIAGNOSIS — Z23 Encounter for immunization: Secondary | ICD-10-CM

## 2010-12-27 ENCOUNTER — Ambulatory Visit (INDEPENDENT_AMBULATORY_CARE_PROVIDER_SITE_OTHER): Payer: Self-pay | Admitting: Family Medicine

## 2010-12-27 ENCOUNTER — Encounter: Payer: Self-pay | Admitting: Family Medicine

## 2010-12-27 VITALS — BP 163/101 | HR 65

## 2010-12-27 DIAGNOSIS — M25569 Pain in unspecified knee: Secondary | ICD-10-CM

## 2010-12-27 DIAGNOSIS — M7121 Synovial cyst of popliteal space [Baker], right knee: Secondary | ICD-10-CM

## 2010-12-27 DIAGNOSIS — M1711 Unilateral primary osteoarthritis, right knee: Secondary | ICD-10-CM

## 2010-12-27 DIAGNOSIS — M7120 Synovial cyst of popliteal space [Baker], unspecified knee: Secondary | ICD-10-CM

## 2010-12-27 DIAGNOSIS — M25562 Pain in left knee: Secondary | ICD-10-CM

## 2010-12-27 DIAGNOSIS — M1712 Unilateral primary osteoarthritis, left knee: Secondary | ICD-10-CM

## 2010-12-27 DIAGNOSIS — M1710 Unilateral primary osteoarthritis, unspecified knee: Secondary | ICD-10-CM

## 2010-12-27 MED ORDER — TRAMADOL HCL 50 MG PO TABS: ORAL_TABLET | ORAL | Status: DC

## 2010-12-27 MED ORDER — DICLOFENAC SODIUM 75 MG PO TBEC: 75.0000 mg | DELAYED_RELEASE_TABLET | Freq: Two times a day (BID) | ORAL | Status: DC

## 2010-12-27 NOTE — Progress Notes (Signed)
Subjective:    [name], female    DOB: 1950-06-01, 60 y.o.   MRN: [id number]  HPI  Coming to F/U on her right knee pain. She had a cortisone intraarticular injection 4 weeks ago which helped her for 2 weeks then her right knee pain returned. She localizes the pain in the anterior medial aspect of her knee, the pain is 4/10 in intensity, sharp, on and off, radiated to the posterior aspect of her knee, worse with weightbearing activities, improved by rest. She also has noticed some swelling in the posterior aspect of her right knee with pain to palpation in that posterior area. He has been using Mobic for her pain which doesn't help much.  She is also complaining of left knee pain, for the last 2 weeks, the pain is located in the anterior medial aspect of her left knee, on and off, sharp pain, 3/10 intensity, worse with weightbearing activities, improved by rest, radiated to the posterior aspect of her knee. She denies any injuries on her left knee. 2 left cortisone injection on her left knee today.  Patient Active Problem List  Diagnoses  . HYPERLIPIDEMIA, MILD  . HYPOKALEMIA  . ANEMIA, NORMOCYTIC  . TINNITUS  . HYPERTENSION  . SHOULDER PAIN, RIGHT  . NECK PAIN, CHRONIC  . HEADACHE  . DYSPNEA  . Cystocele  . Right knee pain     Current Outpatient Prescriptions on File Prior to Visit  Medication Sig Dispense Refill  . albuterol (PROVENTIL HFA) 108 (90 BASE) MCG/ACT inhaler Inhale 2 puffs into the lungs every 4 (four) hours as needed.  1 Inhaler  3  . cetirizine (ZYRTEC) 10 MG tablet Take 10 mg by mouth daily.        Marland Kitchen FLUoxetine (PROZAC) 20 MG capsule Take 20 mg by mouth daily.        Marland Kitchen gabapentin (NEURONTIN) 400 MG capsule Take 400 mg by mouth. 1 capsule in am and 2 capsule in pm.       . lisinopril (PRINIVIL,ZESTRIL) 20 MG tablet Take 1 tablet (20 mg total) by mouth daily.  30 tablet  11  . meloxicam (MOBIC) 15 MG tablet Take 1 tablet (15 mg total) by mouth  daily.  30 tablet  1  . omeprazole (PRILOSEC) 20 MG capsule Take 1 capsule (20 mg total) by mouth daily.  30 capsule  5  . pravastatin (PRAVACHOL) 20 MG tablet Take 1 tablet (20 mg total) by mouth daily.  30 tablet  5  . QUEtiapine (SEROQUEL) 50 MG tablet Take 1 tablet (50 mg total) by mouth at bedtime.  30 tablet  0   No Known Allergies     Review of Systems  Constitutional: Negative for fever, chills, diaphoresis and fatigue.  Musculoskeletal: Positive for arthralgias. Negative for back pain, joint swelling and gait problem.  Neurological: Negative for weakness and numbness.       Objective:   Physical Exam  Constitutional: She appears well-developed and well-nourished.       BP 163/101  Pulse 65   Pulmonary/Chest: Effort normal.  Musculoskeletal:       Right knee with intact skin, FROM. Patellofemoral crepitus present with flexion and extension. Patellofemoral compression  test +. TTP in the medial aspect of the knee at the medial joint level. McMurray test negative for medial meniscal tear. TTP in the popliteal fossa with swelling in that area suggestive of a popliteal cyst No tenderness on the quad neither or patellar tendon. Ligaments  intact. Lachman neg. Varus and valgus test at 0 and 30 degres neg Poor quad muscle definition.  Tight hamstrings Normal gait without a limp.    Neurological: She is alert.  Skin: Skin is warm. No rash noted. No erythema. No pallor.  Psychiatric: She has a normal mood and affect.    MSK U/S : Right popliteal fossa with a popliteal cyst in between the medial head of the gastrocnemius and the semimembranosus tendon.   Left knee intra-articular injection :After obtaining consent, the skin of the anteromedial left knee was sterilely prepped with alcohol swabs, ethyl will chloride was used for local topical anesthesia injection of 4 mL 1% lidocaine plus one mL of Kenalog was injected intraarticular in the left knee. The procedure was  well-tolerated by the patient. Patient instructed to remain in clinic for 20 minutes afterwards, and to report any adverse reaction to me immediately.  Right popliteal cyst aspiration : After obtaining consent, the skin of the anteromedial left knee was sterilely prepped with alcohol swabs, ethyl will chloride was used for local topical anesthesia injection of 4 mL 1% lidocaine plus one mL of Kenalog was injected intraarticular in the left knee. The procedure was well-tolerated by the patient. Patient instructed to remain in clinic for 20 minutes afterwards, and to report any adverse reaction to me immediately.    Assessment & Plan:   1. Right knee DJD  traMADol (ULTRAM) 50 MG tablet, diclofenac (VOLTAREN) 75 MG EC tablet  2. Left knee DJD    3. Knee pain, bilateral  DG Knee Bilateral Standing AP, DG KNEE 1-2 VIEWS BILAT, DG Patella Left, DG PATELLA RIGHT, traMADol (ULTRAM) 50 MG tablet, diclofenac (VOLTAREN) 75 MG EC tablet  4. Synovial cyst of right popliteal space     Rest today and tomorrow Take tramadol 2 tablets every 8 hours for pain control Take diclofenac one tablet every 12 hours for pain and inflammation control Ice massage twice a day and 20 minutes in both of her knees  x-rays of her knee with have weight bearing views. F/U in 4 weeks.

## 2010-12-27 NOTE — Patient Instructions (Signed)
Rest today and tomorrow Take tramadol 2 tablets every 8 hours for pain control Take diclofenac one tablet every 12 hours for pain and inflammation control Ice massage twice a day and 20 minutes in both of her knees  x-rays of her knee with have weight bearing views. F/U in 4 weeks.

## 2010-12-28 ENCOUNTER — Ambulatory Visit (HOSPITAL_COMMUNITY)
Admission: RE | Admit: 2010-12-28 | Discharge: 2010-12-28 | Disposition: A | Discharge status: Home / Self Care | Payer: Self-pay | Source: Ambulatory Visit | Attending: Family Medicine | Admitting: Family Medicine

## 2010-12-28 DIAGNOSIS — M25562 Pain in left knee: Secondary | ICD-10-CM

## 2010-12-28 DIAGNOSIS — M25561 Pain in right knee: Secondary | ICD-10-CM

## 2010-12-28 DIAGNOSIS — M25569 Pain in unspecified knee: Secondary | ICD-10-CM | POA: Insufficient documentation

## 2011-01-10 ENCOUNTER — Telehealth: Payer: Self-pay | Admitting: Family Medicine

## 2011-01-10 NOTE — Telephone Encounter (Signed)
I spoke with Olivia Ewing regarding her x-rays finding. She has bilateral medial knee DJD as well as bilateral patellofemoral DJD more severe on the right side. Scheduled to come back for a followup appointment in the next 2 weeks. We will discuss in more details the finding of her x-rays when she comes back in followup appointment

## 2011-01-11 ENCOUNTER — Ambulatory Visit (HOSPITAL_COMMUNITY)
Admission: RE | Admit: 2011-01-11 | Discharge: 2011-01-11 | Disposition: A | Discharge status: Home / Self Care | Payer: Self-pay | Source: Ambulatory Visit | Attending: Internal Medicine | Admitting: Internal Medicine

## 2011-01-11 DIAGNOSIS — M25561 Pain in right knee: Secondary | ICD-10-CM

## 2011-01-11 DIAGNOSIS — D649 Anemia, unspecified: Secondary | ICD-10-CM

## 2011-01-11 DIAGNOSIS — Z1231 Encounter for screening mammogram for malignant neoplasm of breast: Secondary | ICD-10-CM

## 2011-01-11 DIAGNOSIS — M898X9 Other specified disorders of bone, unspecified site: Secondary | ICD-10-CM

## 2011-01-11 DIAGNOSIS — M1711 Unilateral primary osteoarthritis, right knee: Secondary | ICD-10-CM

## 2011-01-11 DIAGNOSIS — Z23 Encounter for immunization: Secondary | ICD-10-CM

## 2011-01-16 ENCOUNTER — Other Ambulatory Visit: Payer: Self-pay | Admitting: *Deleted

## 2011-01-16 NOTE — Telephone Encounter (Signed)
Pt requesting refill on ABX which was ordered for tooth infection. States her appt is Tuesday of next week and wants a refill. Amoxicillin 500mg  was ordered 10/17 also 9/21.  Pharmacy is [facility] [address].

## 2011-01-18 ENCOUNTER — Other Ambulatory Visit: Payer: Self-pay | Admitting: Internal Medicine

## 2011-01-18 MED ORDER — AMOXICILLIN 500 MG PO CAPS: 500.0000 mg | ORAL_CAPSULE | Freq: Two times a day (BID) | ORAL | Status: AC

## 2011-01-21 NOTE — Telephone Encounter (Signed)
Amoxicillin 500mg  rx called to [facility] pharmacy 01/18/11 12:07PM( see new rx); pt was also called.

## 2011-01-22 ENCOUNTER — Encounter: Payer: Self-pay | Admitting: Internal Medicine

## 2011-01-24 ENCOUNTER — Ambulatory Visit (INDEPENDENT_AMBULATORY_CARE_PROVIDER_SITE_OTHER): Payer: Self-pay | Admitting: Family Medicine

## 2011-01-24 ENCOUNTER — Ambulatory Visit: Payer: Self-pay | Admitting: Family Medicine

## 2011-01-24 ENCOUNTER — Encounter: Payer: Self-pay | Admitting: Family Medicine

## 2011-01-24 VITALS — BP 141/94 | HR 63

## 2011-01-24 DIAGNOSIS — M1712 Unilateral primary osteoarthritis, left knee: Secondary | ICD-10-CM

## 2011-01-24 DIAGNOSIS — M1710 Unilateral primary osteoarthritis, unspecified knee: Secondary | ICD-10-CM

## 2011-01-24 DIAGNOSIS — M1711 Unilateral primary osteoarthritis, right knee: Secondary | ICD-10-CM

## 2011-01-24 NOTE — Progress Notes (Signed)
  Subjective:    [name], female    DOB: Dec 05, 1950, 60 y.o.   MRN: [id number]  HPI  Olivia Ewing is coming to f/u on her B/l knee pain. We did a left knee articular cortisone injections 4 weeks ago which helped her tremendously with her left knee pain. She denies any pain on her left knee. Her left knee is doing great. Regarding her right knee pain is 30% better than 4 weeks ago. We drained a Baker's cyst from her right knee and injected cortisone in the Baker's cyst. She still that her right knee pain is located in the medial aspect of her knee. On and off, worse with weight bearing activities, , sharp, no radiating, she feel that her knees gives away on and off   Review of Systems  Constitutional: Negative for fever, chills, diaphoresis and fatigue.  Musculoskeletal: Negative for back pain, joint swelling, arthralgias and gait problem.  Neurological: Negative for weakness and numbness.       Objective:   Physical Exam  Constitutional: She appears well-developed and well-nourished.       BP 141/94  Pulse 63   Pulmonary/Chest: Effort normal.  Musculoskeletal:       Right knee with intact skin, FROM. Patellofemoral crepitus present with flexion and extension. Patellofemoral compression  test +. No tenderness on the quad neither or patellar tendon. TTP in the mid joint line. Ligaments intact. Lachman neg. Varus and valgus test at 0 and 30 degres neg Poor quad muscle definition.  Tight hamstrings Normal gait without a limp. Feet with mid arch.  Left knee with intact skin, FROM. Patellofemoral crepitus present with flexion and extension. Patellofemoral compression  test +. No tenderness on the quad neither or patellar tendon. Ligaments intact. Lachman neg. Varus and valgus test at 0 and 30 degres neg Poor quad muscle definition.  Tight hamstrings Normal gait without a limp.    Neurological: She is alert.  Skin: Skin is warm. No rash noted. No erythema.    Psychiatric: She has a normal mood and affect. Her behavior is normal. Thought content normal.      Assessment & Plan:   1. Left knee DJD   2. Right knee DJD    Use knee sleeve. mobic prn pain Quad strengthening exercises. We can repeat cortisone injection every 4 month if necessary Recommended to use Glucosamine and chondroitin sulfate Recommended to lose weight.

## 2011-01-24 NOTE — Patient Instructions (Signed)
Use knee sleeve. mobic prn pain Quad strengthening exercises. We can repeat cortisone injection every 4 month if necessary Recommended to use Glucosamine and chondroitin sulfate Recommended to lose weight.

## 2011-01-31 ENCOUNTER — Encounter: Payer: Self-pay | Admitting: Internal Medicine

## 2011-02-08 ENCOUNTER — Encounter (HOSPITAL_COMMUNITY): Payer: Self-pay | Admitting: Emergency Medicine

## 2011-02-08 ENCOUNTER — Encounter (HOSPITAL_COMMUNITY): Payer: Self-pay | Admitting: *Deleted

## 2011-02-08 ENCOUNTER — Emergency Department (HOSPITAL_COMMUNITY): Payer: Self-pay

## 2011-02-08 ENCOUNTER — Other Ambulatory Visit: Payer: Self-pay

## 2011-02-08 ENCOUNTER — Emergency Department (INDEPENDENT_AMBULATORY_CARE_PROVIDER_SITE_OTHER)
Admission: EM | Admit: 2011-02-08 | Discharge: 2011-02-08 | Disposition: A | Discharge status: Other Acute Inpatient Hospital | Payer: Self-pay | Source: Home / Self Care | Attending: Emergency Medicine | Admitting: Emergency Medicine

## 2011-02-08 ENCOUNTER — Emergency Department (HOSPITAL_COMMUNITY)
Admission: EM | Admit: 2011-02-08 | Discharge: 2011-02-08 | Disposition: A | Discharge status: Home / Self Care | Payer: Self-pay | Attending: Emergency Medicine | Admitting: Emergency Medicine

## 2011-02-08 DIAGNOSIS — I10 Essential (primary) hypertension: Secondary | ICD-10-CM | POA: Insufficient documentation

## 2011-02-08 DIAGNOSIS — K529 Noninfective gastroenteritis and colitis, unspecified: Secondary | ICD-10-CM

## 2011-02-08 DIAGNOSIS — E861 Hypovolemia: Secondary | ICD-10-CM

## 2011-02-08 DIAGNOSIS — F411 Generalized anxiety disorder: Secondary | ICD-10-CM

## 2011-02-08 DIAGNOSIS — E860 Dehydration: Secondary | ICD-10-CM | POA: Insufficient documentation

## 2011-02-08 DIAGNOSIS — K219 Gastro-esophageal reflux disease without esophagitis: Secondary | ICD-10-CM | POA: Insufficient documentation

## 2011-02-08 DIAGNOSIS — I959 Hypotension, unspecified: Secondary | ICD-10-CM

## 2011-02-08 DIAGNOSIS — K5289 Other specified noninfective gastroenteritis and colitis: Secondary | ICD-10-CM

## 2011-02-08 DIAGNOSIS — R1110 Vomiting, unspecified: Secondary | ICD-10-CM

## 2011-02-08 DIAGNOSIS — F419 Anxiety disorder, unspecified: Secondary | ICD-10-CM

## 2011-02-08 DIAGNOSIS — R55 Syncope and collapse: Secondary | ICD-10-CM | POA: Insufficient documentation

## 2011-02-08 DIAGNOSIS — R1013 Epigastric pain: Secondary | ICD-10-CM | POA: Insufficient documentation

## 2011-02-08 DIAGNOSIS — E785 Hyperlipidemia, unspecified: Secondary | ICD-10-CM | POA: Insufficient documentation

## 2011-02-08 DIAGNOSIS — R112 Nausea with vomiting, unspecified: Secondary | ICD-10-CM | POA: Insufficient documentation

## 2011-02-08 DIAGNOSIS — R064 Hyperventilation: Secondary | ICD-10-CM

## 2011-02-08 LAB — CBC
HCT: 33.9 % — ABNORMAL LOW (ref 36.0–46.0)
Hemoglobin: 11.2 g/dL — ABNORMAL LOW (ref 12.0–15.0)
RBC: 4.08 MIL/uL (ref 3.87–5.11)
WBC: 8.1 10*3/uL (ref 4.0–10.5)

## 2011-02-08 LAB — DIFFERENTIAL
Basophils Absolute: 0 10*3/uL (ref 0.0–0.1)
Lymphocytes Relative: 25 % (ref 12–46)
Lymphs Abs: 2.1 10*3/uL (ref 0.7–4.0)
Monocytes Absolute: 0.6 10*3/uL (ref 0.1–1.0)
Monocytes Relative: 8 % (ref 3–12)
Neutro Abs: 5.3 10*3/uL (ref 1.7–7.7)

## 2011-02-08 LAB — URINE MICROSCOPIC-ADD ON

## 2011-02-08 LAB — COMPREHENSIVE METABOLIC PANEL
AST: 17 U/L (ref 0–37)
BUN: 22 mg/dL (ref 6–23)
CO2: 24 mEq/L (ref 19–32)
Chloride: 100 mEq/L (ref 96–112)
Creatinine, Ser: 1.31 mg/dL — ABNORMAL HIGH (ref 0.50–1.10)
GFR calc non Af Amer: 44 mL/min — ABNORMAL LOW (ref >90)
Glucose, Bld: 97 mg/dL (ref 70–99)
Total Bilirubin: 0.2 mg/dL — ABNORMAL LOW (ref 0.3–1.2)

## 2011-02-08 LAB — URINALYSIS, ROUTINE W REFLEX MICROSCOPIC
Glucose, UA: NEGATIVE mg/dL
Hgb urine dipstick: NEGATIVE
Ketones, ur: NEGATIVE mg/dL
Protein, ur: 30 mg/dL — AB

## 2011-02-08 MED ORDER — MORPHINE SULFATE 4 MG/ML IJ SOLN
4.0000 mg | Freq: Once | INTRAMUSCULAR | Status: AC
  Administered 2011-02-08: 4 mg via INTRAVENOUS
  Filled 2011-02-08: qty 1

## 2011-02-08 MED ORDER — SODIUM CHLORIDE 0.9 % IV BOLUS (SEPSIS)
1000.0000 mL | Freq: Once | INTRAVENOUS | Status: AC
  Administered 2011-02-08: 1000 mL via INTRAVENOUS

## 2011-02-08 MED ORDER — GI COCKTAIL ~~LOC~~
30.0000 mL | Freq: Once | ORAL | Status: AC
  Administered 2011-02-08: 30 mL via ORAL
  Filled 2011-02-08 (×2): qty 30

## 2011-02-08 MED ORDER — OMEPRAZOLE 20 MG PO CPDR: 20.0000 mg | DELAYED_RELEASE_CAPSULE | Freq: Every day | ORAL | Status: DC

## 2011-02-08 MED ORDER — ONDANSETRON HCL 4 MG/2ML IJ SOLN
4.0000 mg | Freq: Once | INTRAMUSCULAR | Status: AC
  Administered 2011-02-08: 4 mg via INTRAVENOUS
  Filled 2011-02-08: qty 2

## 2011-02-08 MED ORDER — ALUM & MAG HYDROXIDE-SIMETH 500-450-40 MG/5ML PO SUSP: 15.0000 mL | Freq: Four times a day (QID) | ORAL | Status: AC | PRN

## 2011-02-08 MED ORDER — PROMETHAZINE HCL 25 MG PO TABS: 25.0000 mg | ORAL_TABLET | Freq: Four times a day (QID) | ORAL | Status: DC | PRN

## 2011-02-08 MED ORDER — SODIUM CHLORIDE 0.9 % IV BOLUS (SEPSIS): 1000.0000 mL | Freq: Once | INTRAVENOUS | Status: DC

## 2011-02-08 MED ORDER — SODIUM CHLORIDE 0.9 % IV SOLN
INTRAVENOUS | Status: DC
  Administered 2011-02-08: 17:00:00 via INTRAVENOUS

## 2011-02-08 NOTE — ED Notes (Signed)
Rates 9/10 abd pain with nausea.

## 2011-02-08 NOTE — ED Notes (Signed)
Pt waiting for a disposition.  She is ready to go home

## 2011-02-08 NOTE — ED Notes (Signed)
bp low still 2nd liter nss added.  Pt alert no complaints

## 2011-02-08 NOTE — ED Notes (Signed)
Pt c/o nausea, vomiting and dizziness since yesterday. When leading pt from waiting room pt had syncopal episode on waiting room floor. Pt eyes were flickering in her head. Pt regained consciousness after about 10 seconds. Pt was taken by wheelchair into room with daughter. Pt c/o SOB and emesis. Pt and daughter both appear very anxious. Pt is slightly lethargic.

## 2011-02-08 NOTE — ED Provider Notes (Signed)
History     CSN: [id number]  Arrival date & time 02/08/11  1628   First MD Initiated Contact with Patient 02/08/11 1641      Chief Complaint  Patient presents with  . Loss of Consciousness    (Consider location/radiation/quality/duration/timing/severity/associated sxs/prior treatment) HPI Comments: Mrs. Olivia Ewing is a 60 year old female with high blood pressure and hyperlipidemia who has had a two-day history of nausea, vomiting, and abdominal pain. 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 She's had no fever or diarrhea. No blood or coffee ground emesis. Also for the past 2 days she's had chills, shortness of breath, chest pain, anxiety, hyperventilation, and a sensation of her heart racing. Today in the waiting room of the urgent care Center she passed out momentarily. Upon arrival back in the back her blood pressure was low at 99/71. It's usually much higher than this. She denies any cardiac history.  Patient is a 60 y.o. female presenting with syncope.  Loss of Consciousness Associated symptoms include chest pain, abdominal pain and shortness of breath.    Past Medical History  Diagnosis Date  . Hyperlipidemia   . Hypertension   . GERD (gastroesophageal reflux disease)     Past Surgical History  Procedure Date  . Hysterectomy other   . Bladder tack 10/2006    History reviewed. No pertinent family history.  History  Substance Use Topics  . Smoking status: Former Games developer  . Smokeless tobacco: Never Used  . Alcohol Use: Not on file    OB History    Grav Para Term Preterm Abortions TAB SAB Ect Mult Living                  Review of Systems  Constitutional: Positive for chills. Negative for fever, appetite change and unexpected weight change.  Respiratory: Positive for shortness of breath. Negative for cough and wheezing.   Cardiovascular: Positive for chest pain and syncope.  Gastrointestinal: Positive for nausea,  vomiting, abdominal pain and diarrhea. Negative for constipation, blood in stool, abdominal distention, anal bleeding and rectal pain.  Genitourinary: Negative for dysuria, urgency and frequency.  Skin: Negative for rash.    Allergies  Review of patient's allergies indicates no known allergies.  Home Medications   Current Outpatient Rx  Name Route Sig Dispense Refill  . ALBUTEROL SULFATE HFA 108 (90 BASE) MCG/ACT IN AERS Inhalation Inhale 2 puffs into the lungs every 4 (four) hours as needed. 1 Inhaler 3  . CETIRIZINE HCL 10 MG PO TABS Oral Take 10 mg by mouth daily.      Marland Kitchen DICLOFENAC SODIUM 75 MG PO TBEC Oral Take 1 tablet (75 mg total) by mouth 2 (two) times daily with a meal. 30 tablet 1  . FLUOXETINE HCL 20 MG PO CAPS Oral Take 20 mg by mouth daily.      Marland Kitchen GABAPENTIN 400 MG PO CAPS Oral Take 400 mg by mouth. 1 capsule in am and 2 capsule in pm.     . LISINOPRIL 20 MG PO TABS Oral Take 1 tablet (20 mg total) by mouth daily. 30 tablet 11  . MELOXICAM 15 MG PO TABS Oral Take 1 tablet (15 mg total) by mouth daily. 30 tablet 1  . OMEPRAZOLE 20 MG PO CPDR Oral Take 1 capsule (20 mg total) by mouth daily. 30 capsule 5  . PRAVASTATIN SODIUM 20 MG PO TABS Oral Take 1 tablet (20 mg total) by mouth daily. 30 tablet 5  .  QUETIAPINE FUMARATE 50 MG PO TABS Oral Take 1 tablet (50 mg total) by mouth at bedtime. 30 tablet 0    Medication is prescribed by a behavioral health ce ...  . TRAMADOL HCL 50 MG PO TABS  Take 1 tab po bid per 10 days, then prn. 40 tablet 0    BP 99/71  Pulse 77  Temp(Src) 98.2 F (36.8 C) (Oral)  Resp 22  SpO2 100%  Physical Exam  Nursing note and vitals reviewed. Constitutional: She appears well-developed and well-nourished. She appears distressed (she appears nervous, anxious, and is hyperventilating).  Eyes: No scleral icterus.  Cardiovascular: Normal rate, regular rhythm and normal heart sounds.  Exam reveals no gallop and no friction rub.   No murmur  heard. Pulmonary/Chest: Effort normal and breath sounds normal. No respiratory distress. She has no wheezes. She has no rales.  Abdominal: Soft. Bowel sounds are normal. She exhibits no distension and no mass. There is no hepatosplenomegaly. There is tenderness (she has mild, but diffuse tenderness to palpation in all quadrants of the abdomen without guarding or rebound). There is no rebound, no guarding and no CVA tenderness.  Skin: Skin is warm. No rash noted. She is diaphoretic.    ED Course  Procedures (including critical care time)   Date: 02/08/2011  Rate: 62  Rhythm: normal sinus rhythm  QRS Axis: normal  Intervals: normal  ST/T Wave abnormalities: normal  Conduction Disutrbances:none  Narrative Interpretation: Normal EKG  Old EKG Reviewed: none available   Labs Reviewed - No data to display No results found.   1. Gastroenteritis   2. Dehydration   3. Hypovolemia   4. Hypotension   5. Anxiety   6. Hyperventilation       MDM  She appears to have gastroenteritis, dehydration, hypovolemia, hypotension, anxiety, and hyperventilation. We have put in an IV of normal saline and are transferring her by [facility] to the hospital emergency room.        Roque Lias, MD 02/08/11 [phone]

## 2011-02-08 NOTE — ED Notes (Signed)
Pt asking what the plans are for her.  Waiting for a disposition

## 2011-02-08 NOTE — ED Notes (Signed)
ekg given to dr. Lynnae January.

## 2011-02-08 NOTE — ED Notes (Signed)
Patient states she has been nauseated and vomiting x 4 days and she fells lightheaded with generalized weakness. Patient states her chest felt like her heart was beating fast and she was SOB. Patient states her abdomen is sore and hurts and feel like something is stuck in there (points to epi-gastric area). Patient deniers fever. Patient was at Urgent Care today and while there she passed out and the nurse called EMS to bring her to the ED.

## 2011-02-08 NOTE — ED Notes (Signed)
The pt is alert no distress.  Med just given.  Family at the bedside

## 2011-02-08 NOTE — ED Notes (Addendum)
Per EMS patient has been vomiting x 2 days and was at Urgent Care when EMS was called. 12 lead was normal. HR 60's  Urgent Care BP 99/77.

## 2011-02-08 NOTE — ED Notes (Signed)
Placed Pt on o2  nasal cannula 2liter/ and heart monitor.

## 2011-02-08 NOTE — ED Notes (Signed)
Pt to xray

## 2011-02-08 NOTE — ED Provider Notes (Signed)
History     CSN: [id number]  Arrival date & time 02/08/11  1749   First MD Initiated Contact with Patient 02/08/11 1753      Chief Complaint  Patient presents with  . Loss of Consciousness    (Consider location/radiation/quality/duration/timing/severity/associated sxs/prior treatment) The history is provided by the patient and a relative.   Patient is a 60 year old female presents with nausea and vomiting. This started about 2 days ago. She has had multiple episodes of nonbloody nonbilious vomiting over the last couple days. She's had persistent nausea. She also has had epigastric pain which started around sometimes the nausea and vomiting.. There is no radiation of this pain. The pain does not significantly change after eating. She is having difficulty tolerating by mouth secondary to nausea vomiting. Patient has had no diarrhea. She did have normal bowel movements last couple days and had one loose stool today. There is no blood in her stool. She's had no fever. She has had no respiratory complaints. Also denies chest pain. She has never had this problem before. She has had abdominal surgery for hysterectomy and appendicitis. She has no history of obstructive issues. Patient denies dysuria or flank pain. Patient was seen at urgent care for this complaint. Just prior to being bedded, patient stood up and had syncopal episode. This was witnessed. She was caught by staff and did not hit her head. She did not sustain any injuries during the fall. Transferred here after syncopal episode for further evaluation. Patient does not have prior history of syncope or seizures.   Past Medical History  Diagnosis Date  . Hyperlipidemia   . Hypertension   . GERD (gastroesophageal reflux disease)     Past Surgical History  Procedure Date  . Hysterectomy other   . Bladder tack 10/2006  . Appendectomy     History reviewed. No pertinent family history.  History  Substance Use Topics  . Smoking  status: Former Games developer  . Smokeless tobacco: Never Used  . Alcohol Use: Yes     socially    OB History    Grav Para Term Preterm Abortions TAB SAB Ect Mult Living                  Review of Systems  Constitutional: Negative for fever and chills.  HENT: Negative for facial swelling.   Eyes: Negative for visual disturbance.  Respiratory: Negative for cough, chest tightness, shortness of breath and wheezing.   Cardiovascular: Negative for chest pain.  Gastrointestinal: Positive for nausea, vomiting and abdominal pain. Negative for diarrhea.  Genitourinary: Negative for dysuria and difficulty urinating.  Skin: Negative for rash.  Neurological: Negative for weakness and numbness.  Psychiatric/Behavioral: Negative for behavioral problems and confusion.  All other systems reviewed and are negative.    Allergies  Review of patient's allergies indicates no known allergies.  Home Medications   Current Outpatient Rx  Name Route Sig Dispense Refill  . ALBUTEROL SULFATE HFA 108 (90 BASE) MCG/ACT IN AERS Inhalation Inhale 2 puffs into the lungs every 4 (four) hours as needed. Shortness of breath     . CETIRIZINE HCL 10 MG PO TABS Oral Take 10 mg by mouth daily.      Marland Kitchen DICLOFENAC SODIUM 75 MG PO TBEC Oral Take 1 tablet (75 mg total) by mouth 2 (two) times daily with a meal. 30 tablet 1  . FLUOXETINE HCL 20 MG PO CAPS Oral Take 20 mg by mouth daily.      Marland Kitchen GABAPENTIN  400 MG PO CAPS Oral Take 400 mg by mouth. 1 capsule in am and 2 capsule in pm.     . LISINOPRIL 20 MG PO TABS Oral Take 1 tablet (20 mg total) by mouth daily. 30 tablet 11  . MELOXICAM 15 MG PO TABS Oral Take 1 tablet (15 mg total) by mouth daily. 30 tablet 1  . OMEPRAZOLE 20 MG PO CPDR Oral Take 1 capsule (20 mg total) by mouth daily. 30 capsule 5  . PRAVASTATIN SODIUM 20 MG PO TABS Oral Take 1 tablet (20 mg total) by mouth daily. 30 tablet 5  . TRAMADOL HCL 50 MG PO TABS Oral Take 50 mg by mouth every 6 (six) hours as needed.  For knee pain     . ALUM & MAG HYDROXIDE-SIMETH 500-450-40 MG/5ML PO SUSP Oral Take 15 mLs by mouth every 6 (six) hours as needed for indigestion. 355 mL 0  . HYDROCODONE-ACETAMINOPHEN 5-500 MG PO TABS Oral Take 1 tablet by mouth every 6 (six) hours as needed. For pain     . OMEPRAZOLE 20 MG PO CPDR Oral Take 1 capsule (20 mg total) by mouth daily. 30 capsule 0  . PROMETHAZINE HCL 25 MG PO TABS Oral Take 1 tablet (25 mg total) by mouth every 6 (six) hours as needed for nausea. 30 tablet 0  . QUETIAPINE FUMARATE 50 MG PO TABS Oral Take 1 tablet (50 mg total) by mouth at bedtime. 30 tablet 0    Medication is prescribed by a behavioral health ce ...    BP 111/77  Pulse 72  Temp(Src) 98.8 F (37.1 C) (Oral)  Resp 20  SpO2 100%  Physical Exam  Nursing note and vitals reviewed. Constitutional: She is oriented to person, place, and time. She appears well-developed and well-nourished. No distress.  HENT:  Head: Normocephalic.  Nose: Nose normal.  Eyes: EOM are normal.  Neck: Normal range of motion. Neck supple.  Cardiovascular: Normal rate, regular rhythm and intact distal pulses.   Pulmonary/Chest: Effort normal and breath sounds normal. No respiratory distress.  Abdominal: Soft. She exhibits no distension. There is tenderness.       Moderate tenderness to palpation over epigastrium. No rebound or guarding. No peritonitis. No Murphy sign.  Musculoskeletal: Normal range of motion. She exhibits no edema and no tenderness.  Neurological: She is alert and oriented to person, place, and time.       Normal strength  Skin: Skin is warm and dry. No rash noted. She is not diaphoretic.  Psychiatric: She has a normal mood and affect. Her behavior is normal. Thought content normal.    ED Course  Procedures (including critical care time)  ECG on 02/08/2011 at 1803: Sinus rhythm with rate of 56. Left axis deviation. LVH. Nonspecific T-wave change in III. Normal AST. Intervals normal. No delta waves.  No comparison available. No acute ischemia.  Labs Reviewed  CBC - Abnormal; Notable for the following:    Hemoglobin 11.2 (*)    HCT 33.9 (*)    All other components within normal limits  COMPREHENSIVE METABOLIC PANEL - Abnormal; Notable for the following:    Potassium 3.1 (*)    Creatinine, Ser 1.31 (*)    Total Bilirubin 0.2 (*)    GFR calc non Af Amer 44 (*)    GFR calc Af Amer 51 (*)    All other components within normal limits  URINALYSIS, ROUTINE W REFLEX MICROSCOPIC - Abnormal; Notable for the following:    APPearance HAZY (*)  Bilirubin Urine SMALL (*)    Protein, ur 30 (*)    Leukocytes, UA SMALL (*)    All other components within normal limits  URINE MICROSCOPIC-ADD ON - Abnormal; Notable for the following:    Squamous Epithelial / LPF MANY (*)    Bacteria, UA FEW (*)    Casts HYALINE CASTS (*)    All other components within normal limits  DIFFERENTIAL  LIPASE, BLOOD   Dg Chest 2 View  02/08/2011  *RADIOLOGY REPORT*  Clinical Data: Syncope  CHEST - 2 VIEW  Comparison: 07/22/2008  Findings: Cardiomediastinal silhouette is stable.  No acute infiltrate or pleural effusion.  No pulmonary edema.  Stable osteopenia and mild degenerative changes thoracic spine.  IMPRESSION: No active disease.  No significant change.  Original Report Authenticated By: Natasha Mead, M.D.     1. Dehydration   2. Syncope   3. Vomiting       MDM   Clinical picture is consistent with dehydration secondary to vomiting.  Her syncope was positional in nature and considering her dehydration is most consistent with orthostatic syncope. No chest pain, dyspnea, neurologic changes or other features that would suggest a primary cardiac or neurologic etiology. Her abdominal exam revealed mild epigastric tenderness to palpation without peritonitis. Her laboratory was remarkable for mild increase in her creatinine up to 1.3. This appears to be prerenal. 2 L of normal saline given as well as morphine and  Zofran x1. Patient had significant improvement in her pain and symptoms after these interventions. Recheck abdominal exam reveals soft nontender abdomen. The most likely underlying etiology is acute viral infection of GI tract. However, with her epigastric tenderness palpation, there may be a component of gastritis. Will treat for both. On discharge, patient is well appearing and tolerating by mouth. She is able to ambulate without issue. Discussed return precautions. No acute indication for abdominal imaging. Patient followed by outpatient clinics and I discussed importance of followup as well as recheck of renal function.       Milus Glazier 02/08/11 2352  Milus Glazier 02/08/11 2355

## 2011-02-09 NOTE — ED Provider Notes (Signed)
I saw and evaluated the patient, reviewed the resident's note and I agree with the findings and plan.   .Face to face Exam:  General:  Awake HEENT:  Atraumatic Resp:  Normal effort Abd:  Nondistended Neuro:No focal weakness Lymph: No adenopathy   Nelia Shi, MD 02/09/11 [phone]

## 2011-02-21 ENCOUNTER — Ambulatory Visit: Payer: Self-pay | Admitting: Family Medicine

## 2011-02-21 ENCOUNTER — Other Ambulatory Visit: Payer: Self-pay | Admitting: *Deleted

## 2011-02-21 MED ORDER — HYDROCODONE-ACETAMINOPHEN 5-325 MG PO TABS: 1.0000 | ORAL_TABLET | Freq: Three times a day (TID) | ORAL | Status: AC | PRN

## 2011-03-13 ENCOUNTER — Telehealth: Payer: Self-pay | Admitting: *Deleted

## 2011-03-13 ENCOUNTER — Other Ambulatory Visit: Payer: Self-pay | Admitting: *Deleted

## 2011-03-13 NOTE — Telephone Encounter (Signed)
It was sports med that refilled her vicodin and that wasn't for the tooth. I do not prescribe opioids over the phone. Encourage her to see dentist although I realize she doesn't have insurance. Do we still have a dental wait list for referrals? If so I will put in a referral for her. She can try oragel.   Update: If she can provide [name] with documentation that she does have an dental appt, I will run her name through the [facility] database and if OK, I will Rx a few hydrocodone.

## 2011-03-13 NOTE — Telephone Encounter (Signed)
Pt calls c/o dental pain, will be having complete dental extraction in near future, has 8 teeth at the moment that are causing her pain, would like abx and pain meds, appt is offered for thurs 1/31, refused, appt given for fri 2/1 at 1345 per chilonb. Dr Clyde Lundborg

## 2011-03-13 NOTE — Telephone Encounter (Signed)
Pt called and she goes to [facility]. Miss Asher Muir # [phone] She can have all dental work done for $30. I left a message for them to call and verify pt's appointment.

## 2011-03-13 NOTE — Telephone Encounter (Signed)
[place] database OK. Last fill was Dec for #20.
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
## 2011-03-14 ENCOUNTER — Ambulatory Visit (INDEPENDENT_AMBULATORY_CARE_PROVIDER_SITE_OTHER): Payer: Self-pay | Admitting: Family Medicine

## 2011-03-14 ENCOUNTER — Ambulatory Visit: Payer: Self-pay | Admitting: Internal Medicine

## 2011-03-14 ENCOUNTER — Encounter: Payer: Self-pay | Admitting: Family Medicine

## 2011-03-14 VITALS — BP 105/75 | HR 81

## 2011-03-14 DIAGNOSIS — IMO0002 Reserved for concepts with insufficient information to code with codable children: Secondary | ICD-10-CM

## 2011-03-14 DIAGNOSIS — M5416 Radiculopathy, lumbar region: Secondary | ICD-10-CM | POA: Insufficient documentation

## 2011-03-14 DIAGNOSIS — S83206A Unspecified tear of unspecified meniscus, current injury, right knee, initial encounter: Secondary | ICD-10-CM | POA: Insufficient documentation

## 2011-03-14 HISTORY — DX: Unspecified tear of unspecified meniscus, current injury, right knee, initial encounter: S83.206A

## 2011-03-14 MED ORDER — METHYLPREDNISOLONE (PAK) 4 MG PO TABS: 4.0000 mg | ORAL_TABLET | Freq: Every day | ORAL | Status: DC

## 2011-03-14 MED ORDER — HYDROCODONE-ACETAMINOPHEN 5-325 MG PO TABS: 1.0000 | ORAL_TABLET | Freq: Three times a day (TID) | ORAL | Status: AC | PRN

## 2011-03-14 NOTE — Patient Instructions (Signed)
You have been scheduled for an appointment for your MRI 03/18/11 at 2:45pm at [facility]- please go to radiology on 1st floor.

## 2011-03-14 NOTE — Progress Notes (Signed)
Subjective:    [name], female    DOB: March 27, 1950, 61 y.o.   MRN: [id number]  HPI  Olivia Ewing is coming to f/u on her right knee pain. The pain returned 2 weeks ago. The pain is located in the medial aspect of her knee. Worse with weightbearing activities, there is a popping sensation in her knee, there is swelling on off on the anteromedial aspect of her knee, the pain is sharp, 4/10 intensity, and radiated. Catching is present and giving away sensation. The pain improved with rest ice NSAIDs N. Norco has helped the pain as well. She has had 2 cortisone injections in particularly in the right knee in the last three-month period. We also drained a Baker cyst under ultrasound 2 month ago which gave her some relief. She had x-rays of both knees done on November 20 12th which show mild DJD but she still has a good joint space.   Patient Active Problem List  Diagnoses  . HYPERLIPIDEMIA, MILD  . HYPOKALEMIA  . ANEMIA, NORMOCYTIC  . TINNITUS  . HYPERTENSION  . SHOULDER PAIN, RIGHT  . NECK PAIN, CHRONIC  . HEADACHE  . DYSPNEA  . Cystocele  . Right knee pain  . Right knee meniscal tear  . Lumbar radiculopathy   Current Outpatient Prescriptions on File Prior to Visit  Medication Sig Dispense Refill  . albuterol (PROVENTIL HFA;VENTOLIN HFA) 108 (90 BASE) MCG/ACT inhaler Inhale 2 puffs into the lungs every 4 (four) hours as needed. Shortness of breath       . cetirizine (ZYRTEC) 10 MG tablet Take 10 mg by mouth daily.        . diclofenac (VOLTAREN) 75 MG EC tablet Take 1 tablet (75 mg total) by mouth 2 (two) times daily with a meal.  30 tablet  1  . FLUoxetine (PROZAC) 20 MG capsule Take 20 mg by mouth daily.        Marland Kitchen gabapentin (NEURONTIN) 400 MG capsule Take 400 mg by mouth. 1 capsule in am and 2 capsule in pm.       . HYDROcodone-acetaminophen (VICODIN) 5-500 MG per tablet Take 1 tablet by mouth every 6 (six) hours as needed. For pain       . lisinopril  (PRINIVIL,ZESTRIL) 20 MG tablet Take 1 tablet (20 mg total) by mouth daily.  30 tablet  11  . meloxicam (MOBIC) 15 MG tablet Take 1 tablet (15 mg total) by mouth daily.  30 tablet  1  . omeprazole (PRILOSEC) 20 MG capsule Take 1 capsule (20 mg total) by mouth daily.  30 capsule  5  . omeprazole (PRILOSEC) 20 MG capsule Take 1 capsule (20 mg total) by mouth daily.  30 capsule  0  . pravastatin (PRAVACHOL) 20 MG tablet Take 1 tablet (20 mg total) by mouth daily.  30 tablet  5  . QUEtiapine (SEROQUEL) 50 MG tablet Take 1 tablet (50 mg total) by mouth at bedtime.  30 tablet  0  . traMADol (ULTRAM) 50 MG tablet Take 50 mg by mouth every 6 (six) hours as needed. For knee pain        No Known Allergies   Review of Systems  Constitutional: Negative for fever, chills, diaphoresis and fatigue.  Musculoskeletal: Positive for back pain, arthralgias and gait problem.  Neurological: Negative for dizziness, weakness and numbness.       Objective:   Physical Exam  Constitutional: She is oriented to person, place, and time. She appears well-developed  and well-nourished.       BP 105/75  Pulse 81   Pulmonary/Chest: Effort normal.  Musculoskeletal:       Low back with intact skin. No swelling, no hematomas. FROM for flexion, extension, rotation and lateralization.  Tenderness to palpation on Right SI joint . TTP in the right gluteal area Pearlean Brownie test is negative for SI joint pain. Straight leg raise negative B/L. Strength 5/5 for hip flexion and extension, 5/5 for knee flexion and extension, 5/5 for ankle plantar and dorsal flexion B/L.DTR patellar and achilles II/IV B/L Sensation intact distally B/L. No leg discrepancy.   Right knee with intact skin, FROM. Patellofemoral crepitus present with flexion and extension. Patellofemoral compression  test +. No tenderness on the quad neither or patellar tendon. TTP in the mid joint line. Ligaments intact. Lachman neg. Varus and valgus test at 0 and 30  degres neg Poor quad muscle definition.  Tight hamstrings Normal gait without a limp. Feet with mid arch     Neurological: She is alert and oriented to person, place, and time.  Skin: Skin is warm. No rash noted. No erythema.  Psychiatric: She has a normal mood and affect. Her behavior is normal.          Assessment & Plan:   1. Right knee meniscal tear  MR Knee Right Wo Contrast, methylPREDNIsolone (MEDROL DOSPACK) 4 MG tablet, HYDROcodone-acetaminophen (NORCO) 5-325 MG per tablet  2. Lumbar radiculopathy  methylPREDNIsolone (MEDROL DOSPACK) 4 MG tablet, HYDROcodone-acetaminophen (NORCO) 5-325 MG per tablet   Regarding her right knee most likely she has a medial meniscal tear, we have tried conservative treatment with NSAIDs, in particular cortisone injections, strengthening and quad exercises, knee and sleeve without much improvement.. We will proceed with an MRI of her right knee.  Follow up with MRI results We gave her 30 tablets of Norco 5/325. I have discussed with the patient that I will not continue her on narcotics for long time.

## 2011-03-15 ENCOUNTER — Other Ambulatory Visit: Payer: Self-pay | Admitting: *Deleted

## 2011-03-15 ENCOUNTER — Ambulatory Visit: Payer: Self-pay | Admitting: Internal Medicine

## 2011-03-15 MED ORDER — METHYLPREDNISOLONE 4 MG PO TABS: ORAL_TABLET | ORAL | Status: DC

## 2011-03-18 ENCOUNTER — Inpatient Hospital Stay (HOSPITAL_COMMUNITY): Admission: RE | Admit: 2011-03-18 | Payer: Self-pay | Source: Ambulatory Visit

## 2011-03-19 ENCOUNTER — Ambulatory Visit (HOSPITAL_COMMUNITY)
Admission: RE | Admit: 2011-03-19 | Discharge: 2011-03-19 | Disposition: A | Discharge status: Home / Self Care | Payer: Self-pay | Source: Ambulatory Visit | Attending: Family Medicine | Admitting: Family Medicine

## 2011-03-19 DIAGNOSIS — M6750 Plica syndrome, unspecified knee: Secondary | ICD-10-CM | POA: Insufficient documentation

## 2011-03-19 DIAGNOSIS — M25469 Effusion, unspecified knee: Secondary | ICD-10-CM | POA: Insufficient documentation

## 2011-03-19 DIAGNOSIS — X58XXXA Exposure to other specified factors, initial encounter: Secondary | ICD-10-CM | POA: Insufficient documentation

## 2011-03-19 DIAGNOSIS — M1710 Unilateral primary osteoarthritis, unspecified knee: Secondary | ICD-10-CM | POA: Insufficient documentation

## 2011-03-19 DIAGNOSIS — M7120 Synovial cyst of popliteal space [Baker], unspecified knee: Secondary | ICD-10-CM | POA: Insufficient documentation

## 2011-03-19 DIAGNOSIS — IMO0002 Reserved for concepts with insufficient information to code with codable children: Secondary | ICD-10-CM | POA: Insufficient documentation

## 2011-03-20 ENCOUNTER — Encounter: Payer: Self-pay | Admitting: Internal Medicine

## 2011-03-21 ENCOUNTER — Telehealth: Payer: Self-pay | Admitting: Family Medicine

## 2011-03-21 NOTE — Telephone Encounter (Signed)
The scope with [name] cortisone and discussed with her  right knee MRI results. She has a tear in the posterior horn of the medial meniscus, outflow 3 compartmental DJD, superior and medial patella plica, in a Baker's cyst. She will need to be related in a orthopedic surgical. We will refer her either to [facility] or [facility] orthopedic residency program for further evaluation and treatment

## 2011-03-26 ENCOUNTER — Encounter: Payer: Self-pay | Admitting: *Deleted

## 2011-03-26 NOTE — Progress Notes (Signed)
[name], female   DOB: September 07, 1950, 61 y.o.   MRN: [id number] [facility] orthopedic called to let you know Pt appt is 04-30-2011 @ 8:45am, patient is aware of apt time/day

## 2011-04-04 ENCOUNTER — Other Ambulatory Visit: Payer: Self-pay | Admitting: *Deleted

## 2011-04-04 ENCOUNTER — Telehealth: Payer: Self-pay | Admitting: Family Medicine

## 2011-04-04 MED ORDER — HYDROCODONE-ACETAMINOPHEN 5-500 MG PO TABS: 1.0000 | ORAL_TABLET | Freq: Four times a day (QID) | ORAL | Status: DC | PRN

## 2011-04-04 NOTE — Telephone Encounter (Signed)
Message copied by Lanier Prude on Thu Apr 04, 2011  5:28 PM ------      Message from: Darius Bump D      Created: Wed Apr 03, 2011  4:37 PM      Contact: [phone]       She said she will call you tomorrow and that it was important to speak to you.  She will call you.  /lp

## 2011-04-04 NOTE — Progress Notes (Signed)
Pt called stating she has appt with ortho at [facility] 3/19.  She spoke with Dr. Ashley Jacobs and said they only thing that relieved her knee and back pain was vicodin.  She is requesting a refill.  Per Dr. Ashley Jacobs- refill sent, but he advised pt that this would be the LAST refill on vicodin from him.

## 2011-04-04 NOTE — Telephone Encounter (Signed)
I spoke with Ms. Hornik today on the phone, requesting another refill for Vicodin, have discussed with her that this would be her last refill, she understands and agrees. She has her appointment to be seen at [facility] next month for a possible knee scope

## 2011-04-28 ENCOUNTER — Other Ambulatory Visit: Payer: Self-pay | Admitting: Internal Medicine

## 2011-07-23 ENCOUNTER — Ambulatory Visit (HOSPITAL_COMMUNITY)
Admission: RE | Admit: 2011-07-23 | Discharge: 2011-07-23 | Disposition: A | Discharge status: Home / Self Care | Payer: Self-pay | Source: Ambulatory Visit | Attending: Internal Medicine | Admitting: Internal Medicine

## 2011-07-23 ENCOUNTER — Encounter: Payer: Self-pay | Admitting: Internal Medicine

## 2011-07-23 ENCOUNTER — Ambulatory Visit (INDEPENDENT_AMBULATORY_CARE_PROVIDER_SITE_OTHER): Payer: No Typology Code available for payment source | Admitting: Internal Medicine

## 2011-07-23 VITALS — BP 130/87 | HR 84 | Temp 98.8°F | Ht 65.0 in | Wt 168.7 lb

## 2011-07-23 DIAGNOSIS — R002 Palpitations: Secondary | ICD-10-CM

## 2011-07-23 DIAGNOSIS — K029 Dental caries, unspecified: Secondary | ICD-10-CM

## 2011-07-23 DIAGNOSIS — F411 Generalized anxiety disorder: Secondary | ICD-10-CM

## 2011-07-23 LAB — CBC
HCT: 35.1 % — ABNORMAL LOW (ref 36.0–46.0)
MCHC: 31.9 g/dL (ref 30.0–36.0)
MCV: 82.2 fL (ref 78.0–100.0)
Platelets: 331 10*3/uL (ref 150–400)
RDW: 14.8 % (ref 11.5–15.5)

## 2011-07-23 MED ORDER — HYDROCODONE-ACETAMINOPHEN 5-500 MG PO TABS: 2.0000 | ORAL_TABLET | Freq: Four times a day (QID) | ORAL | Status: DC | PRN

## 2011-07-23 MED ORDER — AMOXICILLIN 500 MG PO CAPS: 500.0000 mg | ORAL_CAPSULE | Freq: Two times a day (BID) | ORAL | Status: AC

## 2011-07-23 NOTE — Progress Notes (Signed)
[name], female   DOB: [birth year], 61 y.o.   MRN: [id number] Addendum to A/P: # History of normocytic anemia. -last colonoscopy as of 2011 and is without polyps or cancer. Rectal nodule cytology is unremarkable. Next colonoscopy is in 2016.

## 2011-07-23 NOTE — Progress Notes (Signed)
[name], female   DOB: [birth year], 61 y.o.   MRN: [id number] HPI:    1. Reports palpitations and feeling "nervous"  2-3 times daily of a 5 sec duration without any obvious precipitating events/factors. Reports concomitant SOB but no cough, wheezes, CP, swelling of the legs, PND or orthopnea; no diziness or syncopal episodes.. No similar Sx in the past.  Reports being under stress with upcoming GED test, moving out of her house, and looking for a job. Denies being depressed; no SI/HI or mania. Review of Systems: Negative except per history of present illness  Physical Exam:  Nursing notes and vitals reviewed General:  alert, well-developed, and cooperative to examination.   Lungs:  normal respiratory effort, no accessory muscle use, normal breath sounds, no crackles, and no wheezes. Heart:  normal rate, regular rhythm, no murmurs, no gallop, and no rub.   Abdomen:  soft, non-tender, normal bowel sounds, no distention, no guarding, no rebound tenderness, no hepatomegaly, and no splenomegaly.   Extremities:  No cyanosis, clubbing, edema Neurologic:  alert & oriented X3, nonfocal exam  Meds: Current Outpatient Prescriptions on File Prior to Visit  Medication Sig Dispense Refill  . albuterol (PROVENTIL HFA;VENTOLIN HFA) 108 (90 BASE) MCG/ACT inhaler Inhale 2 puffs into the lungs every 4 (four) hours as needed. Shortness of breath       . cetirizine (ZYRTEC) 10 MG tablet Take 10 mg by mouth daily.        . diclofenac (VOLTAREN) 75 MG EC tablet Take 1 tablet (75 mg total) by mouth 2 (two) times daily with a meal.  30 tablet  1  . FLUoxetine (PROZAC) 20 MG capsule Take 20 mg by mouth daily.        Marland Kitchen gabapentin (NEURONTIN) 400 MG capsule Take 400 mg by mouth. 1 capsule in am and 2 capsule in pm.       . lisinopril (PRINIVIL,ZESTRIL) 20 MG tablet Take 1 tablet (20 mg total) by mouth daily.  30 tablet  11  . meloxicam (MOBIC) 15 MG tablet Take 1 tablet (15 mg total) by mouth daily.   30 tablet  1  . methylPREDNIsolone (MEDROL DOSPACK) 4 MG tablet Take 1 tablet (4 mg total) by mouth daily. follow package directions  21 tablet  0  . methylPREDNISolone (MEDROL) 4 MG tablet Day 1-take 5 tabs Day 2-take 5 tabs Day 3, 4 tabs Day 4, 4 tabs Day 5, 2 tabs Day 6, 1 tab  21 tablet  0  . omeprazole (PRILOSEC) 20 MG capsule Take 1 capsule (20 mg total) by mouth daily.  30 capsule  5  . omeprazole (PRILOSEC) 20 MG capsule Take 1 capsule (20 mg total) by mouth daily.  30 capsule  0  . pravastatin (PRAVACHOL) 20 MG tablet Take 1 tablet (20 mg total) by mouth daily.  30 tablet  5  . PROVENTIL HFA 108 (90 BASE) MCG/ACT inhaler INHALE TWO PUFFS BY MOUTH EVERY 4 HOURS AS NEEDED  7 g  2  . traMADol (ULTRAM) 50 MG tablet Take 50 mg by mouth every 6 (six) hours as needed. For knee pain       . DISCONTD: QUEtiapine (SEROQUEL) 50 MG tablet Take 1 tablet (50 mg total) by mouth at bedtime.  30 tablet  0    Allergies: Review of patient's allergies indicates no known allergies. Past Medical History  Diagnosis Date  . Hyperlipidemia   . Hypertension   . GERD (gastroesophageal reflux disease)  Past Surgical History  Procedure Date  . Hysterectomy other   . Bladder tack 10/2006  . Appendectomy    No family history on file. History   Social History  . Marital Status: Divorced    Spouse Name: N/A    Number of Children: N/A  . Years of Education: N/A   Occupational History  . Not on file.   Social History Main Topics  . Smoking status: Former Games developer  . Smokeless tobacco: Never Used  . Alcohol Use: Yes     socially  . Drug Use: No  . Sexually Active: Not on file   Other Topics Concern  . Not on file   Social History Narrative   Single. 9 siblings. 1 brother with HIV, 1 sister with hypertension. 4 children, 1 daughter with HTN and depression, 1 son with HTN.[phone]- shelter's number. Former smoker, no alcohol or drug use.   A/P: 1. Dental caries (left lower incisor  #22) -Vicodin PRN for pain (cautioned of sedation and risk of addiction). Pain contract and UDS obtained. -Amoxyl -Dental referral  2. Anxiety/palpitations. -will check electrolytes, TSH, CBC (Hx of normocytic anemia) and UDS; EKG -continue with fluoxetine for now.  3. Hx of chronic normocytic anemia -last colonoscopy in 2011 without cancer or polyps(?). Will request a pathology reports from the GI office. -next colonoscopy in 5 years per GI recommendations. -repeat CBC today.  F/U in 4 weeks or sooner if needed.

## 2011-07-23 NOTE — Patient Instructions (Signed)
Please, fill in your prescriptions at the pharmacy. Please, renew your orange card ASAP and let [name] know in order for her to place a dental referral. Please, do not use any street drugs!!!! Please, follow up after seeing a dentist and call with any questions.

## 2011-07-23 NOTE — Progress Notes (Addendum)
Copy of Pain Contract given to pt. 

## 2011-07-24 LAB — PRESCRIPTION ABUSE MONITORING 15P, URINE
Cocaine Metabolites: POSITIVE ng/mL — ABNORMAL HIGH
Creatinine, Urine: 502.67 mg/dL (ref >20.0)
Methadone Screen, Urine: NEGATIVE ng/mL
Opiate Screen, Urine: NEGATIVE ng/mL
Oxycodone Screen, Ur: NEGATIVE ng/mL
Propoxyphene: NEGATIVE ng/mL

## 2011-07-25 LAB — BENZODIAZEPINES (GC/LC/MS), URINE
Clonazepam metabolite (GC/LC/MS), ur confirm: NEGATIVE NG/ML
Diazepam (GC/LC/MS), ur confirm: NEGATIVE NG/ML
Flunitrazepam metabolite (GC/LC/MS), ur confirm: NEGATIVE NG/ML
Flurazepam metabolite (GC/LC/MS), ur confirm: NEGATIVE NG/ML
Nordiazepam (GC/LC/MS), ur confirm: NEGATIVE NG/ML
Oxazepam (GC/LC/MS), ur confirm: NEGATIVE NG/ML
Temazepam (GC/LC/MS), ur confirm: NEGATIVE NG/ML

## 2011-07-25 LAB — COCAINE METABOLITE (GC/LC/MS), URINE: Benzoylecgonine GC/MS Conf: 48100 NG/ML — ABNORMAL HIGH

## 2011-08-23 ENCOUNTER — Telehealth: Payer: Self-pay | Admitting: *Deleted

## 2011-08-23 NOTE — Telephone Encounter (Signed)
PATIENT CALLED WILL COME NEXT WEEK TO BRING PAPER WORK FOR [name]. HAD DEATH IN FAMILY. WILL COME IN NEXT WEEK. Olivia Ewing NT 7/12/013  12:22PM

## 2011-08-23 NOTE — Telephone Encounter (Signed)
CALLED AND LEFT VOICE MESSAGE FOR PATIENT TO CALL [facility] / PATIENT NEEDS TO COME IN TO SEE [name] TO APPLY FOR ORANGE CARD/ HAS REFERRAL FOR DENTAL. UNABLE TO MAKE THIS REFERRAL AT THIS TIME. LELA STURDIVANT NT 7/12/013  11:16AM

## 2011-08-30 ENCOUNTER — Telehealth: Payer: Self-pay | Admitting: *Deleted

## 2011-08-30 NOTE — Telephone Encounter (Signed)
CALLED PATIENT AND LEFT VOICE MESSAGE FOR HER TO RETURN CALL/ FOLLOW UP TO SEE IF PATIENT HS GOTTEN HER ORANGE CARD FROM [facility].\ Cartha Rotert NT 7/19/013  11:53

## 2011-09-23 ENCOUNTER — Ambulatory Visit (INDEPENDENT_AMBULATORY_CARE_PROVIDER_SITE_OTHER): Payer: Self-pay | Admitting: Internal Medicine

## 2011-09-23 ENCOUNTER — Encounter: Payer: Self-pay | Admitting: Internal Medicine

## 2011-09-23 VITALS — BP 171/102 | HR 64 | Temp 97.4°F | Ht 65.0 in | Wt 175.6 lb

## 2011-09-23 DIAGNOSIS — I10 Essential (primary) hypertension: Secondary | ICD-10-CM

## 2011-09-23 DIAGNOSIS — K1370 Unspecified lesions of oral mucosa: Secondary | ICD-10-CM

## 2011-09-23 DIAGNOSIS — K029 Dental caries, unspecified: Secondary | ICD-10-CM

## 2011-09-23 DIAGNOSIS — K0530 Chronic periodontitis, unspecified: Secondary | ICD-10-CM | POA: Insufficient documentation

## 2011-09-23 DIAGNOSIS — K122 Cellulitis and abscess of mouth: Secondary | ICD-10-CM

## 2011-09-23 DIAGNOSIS — F329 Major depressive disorder, single episode, unspecified: Secondary | ICD-10-CM

## 2011-09-23 DIAGNOSIS — F32A Depression, unspecified: Secondary | ICD-10-CM | POA: Insufficient documentation

## 2011-09-23 MED ORDER — PENICILLIN V POTASSIUM 500 MG PO TABS: 250.0000 mg | ORAL_TABLET | Freq: Four times a day (QID) | ORAL | Status: AC

## 2011-09-23 MED ORDER — HYDROCODONE-ACETAMINOPHEN 5-500 MG PO TABS: 2.0000 | ORAL_TABLET | Freq: Four times a day (QID) | ORAL | Status: DC | PRN

## 2011-09-23 NOTE — Progress Notes (Signed)
INTERNAL MEDICINE TEACHING ATTENDING ADDENDUM - Rocco Serene, MD: I personally saw and evaluated Olivia Ewing in this clinic visit in conjunction with the resident, Dr. Burtis Junes. I have discussed the patient's plan of care with Dr. Burtis Junes during this visit. I have confirmed the physical exam findings and have read and agree with the clinic note including the plan.

## 2011-09-23 NOTE — Assessment & Plan Note (Signed)
Pt says pain developed over past month with worst today on presentation. Pt still able to eat, drink, breathe, speak w/o difficulty. Denied any fevers or other constitutional symptoms. Pt is w/o insurance at this time and w/o money to afford medications. On PE signs of Periodontitis but no abscess.  -Penicillin VK 250mg  for 7 days -donation from the [facility] fund was made to cover cost of antibiotics -told to return or seek medical attention if symptoms don't improve, develops fever, inability to swallow, or notices change in speech.  -advised f/u with dental clinic once application for assistance is made

## 2011-09-23 NOTE — Progress Notes (Signed)
Subjective:   [name] female   DOB: 1950-07-28 61 y.o.   MRN: [id number]  HPI: [name] is a 61 y.o. AA woman p/w tooth pain. It has gotten progressively worse w/in the last month. She denied fevers/chills, n/v/d, neck pain, inability to swallow, and has FROM of her neck. She has recently been unemployed and homeless along with finding the body of her 18yo nephew who committed suicide. She has now found a home to stay in with a friend/sister and has access to food but is still unable to afford medications. She has not had insurance and has recently run out of her BP meds. She is not suicidal or homicidal at this point in time but is very emotional labile and distressed, but optimistic. She is going to school to get her GED and still continues to do this despite everything.    Past Medical History  Diagnosis Date  . Hyperlipidemia   . Hypertension   . GERD (gastroesophageal reflux disease)    Current Outpatient Prescriptions  Medication Sig Dispense Refill  . albuterol (PROVENTIL HFA;VENTOLIN HFA) 108 (90 BASE) MCG/ACT inhaler Inhale 2 puffs into the lungs every 4 (four) hours as needed. Shortness of breath       . cetirizine (ZYRTEC) 10 MG tablet Take 10 mg by mouth daily.        . diclofenac (VOLTAREN) 75 MG EC tablet Take 1 tablet (75 mg total) by mouth 2 (two) times daily with a meal.  30 tablet  1  . FLUoxetine (PROZAC) 20 MG capsule Take 20 mg by mouth daily.        Marland Kitchen gabapentin (NEURONTIN) 400 MG capsule Take 400 mg by mouth. 1 capsule in am and 2 capsule in pm.       . HYDROcodone-acetaminophen (VICODIN) 5-500 MG per tablet Take 2 tablets by mouth every 6 (six) hours as needed for pain.  20 tablet  0  . lisinopril (PRINIVIL,ZESTRIL) 20 MG tablet Take 1 tablet (20 mg total) by mouth daily.  30 tablet  11  . meloxicam (MOBIC) 15 MG tablet Take 1 tablet (15 mg total) by mouth daily.  30 tablet  1  . methylPREDNIsolone (MEDROL DOSPACK) 4 MG tablet Take 1  tablet (4 mg total) by mouth daily. follow package directions  21 tablet  0  . methylPREDNISolone (MEDROL) 4 MG tablet Day 1-take 5 tabs Day 2-take 5 tabs Day 3, 4 tabs Day 4, 4 tabs Day 5, 2 tabs Day 6, 1 tab  21 tablet  0  . omeprazole (PRILOSEC) 20 MG capsule Take 1 capsule (20 mg total) by mouth daily.  30 capsule  5  . omeprazole (PRILOSEC) 20 MG capsule Take 1 capsule (20 mg total) by mouth daily.  30 capsule  0  . penicillin v potassium (VEETID) 500 MG tablet Take 0.5 tablets (250 mg total) by mouth 4 (four) times daily.  28 tablet  0  . pravastatin (PRAVACHOL) 20 MG tablet Take 1 tablet (20 mg total) by mouth daily.  30 tablet  5  . PROVENTIL HFA 108 (90 BASE) MCG/ACT inhaler INHALE TWO PUFFS BY MOUTH EVERY 4 HOURS AS NEEDED  7 g  2  . QUEtiapine (SEROQUEL) 50 MG tablet Take 50 mg by mouth at bedtime.      . traMADol (ULTRAM) 50 MG tablet Take 50 mg by mouth every 6 (six) hours as needed. For knee pain        No family history  on file. History   Social History  . Marital Status: Divorced    Spouse Name: N/A    Number of Children: N/A  . Years of Education: N/A   Social History Main Topics  . Smoking status: Former Games developer  . Smokeless tobacco: Never Used  . Alcohol Use: Yes     socially  . Drug Use: No  . Sexually Active: None   Other Topics Concern  . None   Social History Narrative   Single. 9 siblings. 1 brother with HIV, 1 sister with hypertension. 4 children, 1 daughter with HTN and depression, 1 son with HTN.[phone]- shelter's number. Former smoker, no alcohol or drug use.   Review of Systems: Pertinent listed in HPI.  Objective:  Physical Exam: Filed Vitals:   09/23/11 1559  BP: 171/102  Pulse: 64  Temp: 97.4 F (36.3 C)  TempSrc: Oral  Height: 5\' 5"  (1.651 m)  Weight: 175 lb 9.6 oz (79.652 kg)   General: tearful, well nourished HEENT: PERRL, EOMI, no scleral icterus, extreme tenderness gingival lining tooth #22 (canine) on left side and tooth #11  (incisor) on upper left, poor dentetion, most teeth missing, no other lesions, no fluctuance, no assymetry, no neck tenderness, no submandibular/cervical lymphadenopathy Cardiac: RRR, no rubs, murmurs or gallops Pulm: clear to auscultation bilaterally, moving normal volumes of air Abd: soft, nontender, nondistended, BS present Ext: warm and well perfused, no pedal edema Neuro: alert and oriented X3, cranial nerves II-XII grossly intact Psych: emotional lability, tearful, denied SI  Assessment & Plan:  Pt was seen and evaluated with Dr. Josem Kaufmann.  Pt will f/u if symptoms get worse, develops fever, or has blurry vision/HA.

## 2011-09-23 NOTE — Patient Instructions (Signed)
You were seen today for your dental pain and found to have an infection we gave you Penicillin VK 250mg  to take for 7 days. Please finish all the medication to clear your infection. To help with your pain some ideas would be ice pack or cold wash cloth on outside of your mouth near area of pain, sucking on an ice cube, orajel for teething babies, or the Vicodin.   It was a pleasure to meet you and hope that you will also see Olivia Ewing or call the mental health crisis line [phone] if you feel you need to talk to someone about all the recent events in your life. Your mental health is important to your healing.   In terms of your elevated blood pressure. If you develop headaches, blurry vision be seen in the emergency department. But at this point we will wait until you are able to get your orange card and then restart your previous medications.   Keep up the amazing work going back to school and wish you the best success :)   Thank you.

## 2011-09-23 NOTE — Assessment & Plan Note (Signed)
Pt has suffered recent trauma of finding nephew, whom she was very close to, who killed himself. The incident was unexpected. Pt also has a lot of compounding social stressors of homelessness, unemployment, financial poverty, and isolation from family and no friends. Pt reports tearful episodes and emotional lability. Pt was previously seen at [facility] in the past. Pt is not suicidal in the past and still has medications that she is taking.  -advised and gave information for Crisis line for pt and advocated that she seek evaluation at [facility] tomorrow

## 2011-09-23 NOTE — Assessment & Plan Note (Signed)
Pt elevated BP on presentation 171/102 but pt asymptomatic. Pt has not had medications for 3 days given loss of employment and finances. Pt awaiting approval for orange card (w/in next few days) or son will give money for medications w/in few days as well. Therefore will continue to monitor and then re-evaluate after medications. Pt was advised to seek medical attention if HA or blurry vision develop.

## 2011-09-26 ENCOUNTER — Other Ambulatory Visit: Payer: Self-pay | Admitting: *Deleted

## 2011-09-26 DIAGNOSIS — I10 Essential (primary) hypertension: Secondary | ICD-10-CM

## 2011-09-27 MED ORDER — LISINOPRIL 20 MG PO TABS: 20.0000 mg | ORAL_TABLET | Freq: Every day | ORAL | Status: DC

## 2011-09-27 NOTE — Telephone Encounter (Signed)
Message sent to front office to schedule pt a lab appt.

## 2011-10-25 ENCOUNTER — Ambulatory Visit: Payer: Self-pay | Admitting: Internal Medicine

## 2011-10-25 ENCOUNTER — Other Ambulatory Visit: Payer: Self-pay | Admitting: *Deleted

## 2011-10-25 ENCOUNTER — Other Ambulatory Visit (INDEPENDENT_AMBULATORY_CARE_PROVIDER_SITE_OTHER): Payer: Self-pay

## 2011-10-25 DIAGNOSIS — I10 Essential (primary) hypertension: Secondary | ICD-10-CM

## 2011-10-25 DIAGNOSIS — K219 Gastro-esophageal reflux disease without esophagitis: Secondary | ICD-10-CM

## 2011-10-25 LAB — BASIC METABOLIC PANEL WITH GFR
BUN: 10 mg/dL (ref 6–23)
GFR, Est African American: 89 mL/min
GFR, Est Non African American: 77 mL/min
Potassium: 5.2 mEq/L (ref 3.5–5.3)
Sodium: 142 mEq/L (ref 135–145)

## 2011-10-25 MED ORDER — OMEPRAZOLE 20 MG PO CPDR: 20.0000 mg | DELAYED_RELEASE_CAPSULE | Freq: Every day | ORAL | Status: DC

## 2011-10-25 MED ORDER — LISINOPRIL 20 MG PO TABS: 20.0000 mg | ORAL_TABLET | Freq: Every day | ORAL | Status: DC

## 2011-10-25 NOTE — Telephone Encounter (Signed)
Lisinopril and Omeprazole rxs called to [facility]; pt was also and made awared.

## 2011-10-25 NOTE — Telephone Encounter (Signed)
BMP being drawn this morning per last refill request.

## 2011-10-28 ENCOUNTER — Ambulatory Visit: Payer: Self-pay | Admitting: Internal Medicine

## 2011-11-04 ENCOUNTER — Telehealth: Payer: Self-pay | Admitting: *Deleted

## 2011-11-04 NOTE — Telephone Encounter (Signed)
Last seen about 6 weeks ago. I will not refill ABX and opioids without exam. She will need to make appt.

## 2011-11-04 NOTE — Telephone Encounter (Signed)
Call from pt.  Requesting a refill on Penicillin V and Vicodin 5/500 mg; states he dental appt at the [facility] is not until Oct. 23,2013@ 1PM. Thanks

## 2011-11-05 NOTE — Telephone Encounter (Signed)
Pt was called; not happy about coming back in even though she already has an appt scheduled for tomorrow. But she re-sched her appt for Thursday 11/07/11.

## 2011-11-06 ENCOUNTER — Encounter: Payer: Self-pay | Admitting: Internal Medicine

## 2011-11-07 ENCOUNTER — Encounter: Payer: Self-pay | Admitting: Internal Medicine

## 2011-11-18 ENCOUNTER — Encounter (HOSPITAL_COMMUNITY): Payer: Self-pay | Admitting: Emergency Medicine

## 2011-11-18 ENCOUNTER — Emergency Department (INDEPENDENT_AMBULATORY_CARE_PROVIDER_SITE_OTHER)
Admission: EM | Admit: 2011-11-18 | Discharge: 2011-11-18 | Disposition: A | Discharge status: Home / Self Care | Payer: Self-pay | Source: Home / Self Care | Attending: Emergency Medicine | Admitting: Emergency Medicine

## 2011-11-18 DIAGNOSIS — K047 Periapical abscess without sinus: Secondary | ICD-10-CM

## 2011-11-18 DIAGNOSIS — J019 Acute sinusitis, unspecified: Secondary | ICD-10-CM

## 2011-11-18 MED ORDER — NEOMYCIN-POLYMYXIN-HC 3.5-10000-1 OT SUSP: 4.0000 [drp] | Freq: Three times a day (TID) | OTIC | Status: DC

## 2011-11-18 MED ORDER — AMOXICILLIN 500 MG PO CAPS: 1000.0000 mg | ORAL_CAPSULE | Freq: Three times a day (TID) | ORAL | Status: DC

## 2011-11-18 MED ORDER — HYDROCODONE-ACETAMINOPHEN 10-325 MG PO TABS: 1.0000 | ORAL_TABLET | Freq: Four times a day (QID) | ORAL | Status: DC | PRN

## 2011-11-18 MED ORDER — MELOXICAM 15 MG PO TABS: 15.0000 mg | ORAL_TABLET | Freq: Every day | ORAL | Status: DC

## 2011-11-18 NOTE — ED Notes (Signed)
C/o facial pain, pressure and headache

## 2011-11-18 NOTE — ED Provider Notes (Signed)
Chief Complaint  Patient presents with  . Facial Pain    History of Present Illness:   Olivia Ewing is a 61 year old female who presents today with 2 problems: Tooth pain, and sinus pain. The tooth pain is located in her left lower canine. It's been going on for about 2 weeks. She has an appointment at the end of the month for a dental clinic. She denies any swelling inside the mouth or outside the mouth. She's had no trouble breathing or swallowing. She denies any fever or chills. The sinus pain has also been going on for about 2 weeks also. She describes pain in the maxillary sinuses bilaterally and some headache. It seems to be centered around the left eye. She notes nasal congestion with a small amount of yellow drainage. She also has had postnasal drainage and some sore throat. She denies any fever, chills, coughing, wheezing, or shortness of breath.  Review of Systems:  Other than noted above, the patient denies any of the following symptoms: Systemic:  No fever, chills, sweats or weight loss. ENT:  No headache, ear ache, sore throat, nasal congestion, facial pain, or swelling. Lymphatic:  No adenopathy. Lungs:  No coughing, wheezing or shortness of breath.  PMFSH:  Past medical history, family history, social history, meds, and allergies were reviewed.  Physical Exam:   Vital signs:  BP 177/102  Pulse 61  Temp 98 F (36.7 C) (Oral)  Resp 20  SpO2 100% General:  Alert, oriented, in no distress. ENT:  TMs and canals normal.  Nasal mucosa normal. Mouth exam:  She only has a few remaining teeth in her mouth. The left lower mandibular canine is carious and tender to touch. There is no swelling of the gingiva or the floor the mouth. The pharynx is clear. Neck:  No swelling or adenopathy. Lungs:  Breath sounds clear and equal bilaterally.  No wheezes, rales or rhonchi. Heart:  Regular rhythm.  No gallops or murmers. Skin:  Clear, warm and dry.  Assessment:  The primary encounter  diagnosis was Dental abscess. A diagnosis of Acute sinusitis was also pertinent to this visit.  Plan:   1.  The following meds were prescribed:   New Prescriptions   AMOXICILLIN (AMOXIL) 500 MG CAPSULE    Take 2 capsules (1,000 mg total) by mouth 3 (three) times daily.   HYDROCODONE-ACETAMINOPHEN (NORCO) 10-325 MG PER TABLET    Take 1 tablet by mouth every 6 (six) hours as needed for pain.   MELOXICAM (MOBIC) 15 MG TABLET    Take 1 tablet (15 mg total) by mouth daily.   NEOMYCIN-POLYMYXIN-HYDROCORTISONE (CORTISPORIN) 3.5-10000-1 OTIC SUSPENSION    Place 4 drops into the right ear 3 (three) times daily.   2.  The patient was instructed in symptomatic care and handouts were given. 3.  The patient was told to return if becoming worse in any way, if no better in 3 or 4 days, and given some red flag symptoms that would indicate earlier return, especially difficulty breathing. 4.  The patient was told to follow up with a dentist as soon as possible.    Reuben Likes, MD 11/18/11 [phone]

## 2011-12-17 ENCOUNTER — Other Ambulatory Visit: Payer: Self-pay | Admitting: *Deleted

## 2011-12-17 ENCOUNTER — Other Ambulatory Visit (HOSPITAL_COMMUNITY): Payer: Self-pay | Admitting: Diagnostic Radiology

## 2011-12-17 DIAGNOSIS — Z1231 Encounter for screening mammogram for malignant neoplasm of breast: Secondary | ICD-10-CM

## 2011-12-17 MED ORDER — ALBUTEROL SULFATE HFA 108 (90 BASE) MCG/ACT IN AERS: 2.0000 | INHALATION_SPRAY | Freq: Four times a day (QID) | RESPIRATORY_TRACT | Status: DC | PRN

## 2012-01-06 ENCOUNTER — Ambulatory Visit (HOSPITAL_COMMUNITY): Payer: No Typology Code available for payment source | Attending: Diagnostic Radiology

## 2012-02-10 ENCOUNTER — Encounter (HOSPITAL_COMMUNITY): Payer: Self-pay | Admitting: *Deleted

## 2012-02-10 ENCOUNTER — Emergency Department (INDEPENDENT_AMBULATORY_CARE_PROVIDER_SITE_OTHER)
Admission: EM | Admit: 2012-02-10 | Discharge: 2012-02-10 | Disposition: A | Discharge status: Home / Self Care | Payer: No Typology Code available for payment source | Source: Home / Self Care | Attending: Emergency Medicine | Admitting: Emergency Medicine

## 2012-02-10 DIAGNOSIS — M549 Dorsalgia, unspecified: Secondary | ICD-10-CM

## 2012-02-10 DIAGNOSIS — M5430 Sciatica, unspecified side: Secondary | ICD-10-CM

## 2012-02-10 MED ORDER — HYDROCODONE-IBUPROFEN 7.5-200 MG PO TABS: 1.0000 | ORAL_TABLET | Freq: Three times a day (TID) | ORAL | Status: AC | PRN

## 2012-02-10 MED ORDER — CYCLOBENZAPRINE HCL 10 MG PO TABS: 10.0000 mg | ORAL_TABLET | Freq: Three times a day (TID) | ORAL | Status: AC

## 2012-02-10 MED ORDER — CYCLOBENZAPRINE HCL 10 MG PO TABS: 10.0000 mg | ORAL_TABLET | Freq: Two times a day (BID) | ORAL | Status: DC | PRN

## 2012-02-10 NOTE — ED Notes (Signed)
C/o R side- neck, shoulder, lower back and R leg onset 1 month ago but got worse 1 week ago.  No known injury.  She works at [employer] and does a lot of lifting and pulling.

## 2012-02-10 NOTE — ED Provider Notes (Signed)
History     CSN: [id number]  Arrival date & time 02/10/12  1513   First MD Initiated Contact with Patient 02/10/12 1612      Chief Complaint  Patient presents with  . Spasms    (Consider location/radiation/quality/duration/timing/severity/associated sxs/prior treatment) HPI Comments: Patient presents urgent care this evening complaining of right-sided lower back pain (points to lower lumbar region), that radiates towards her "right tigh", " feels like needles" ( patient points to the anterior aspect of her leg), denies any weakness, or any constitutional symptoms.  Patient is a 61 y.o. female presenting with back pain. The history is provided by the patient.  Back Pain  This is a chronic problem. The current episode started more than 1 week ago. The problem occurs constantly. The problem has been gradually worsening. The pain is associated with no known injury. The pain is present in the lumbar spine. The quality of the pain is described as shooting. The pain radiates to the right thigh. The pain is at a severity of 4/10. The pain is moderate. The symptoms are aggravated by bending and certain positions. Associated symptoms include paresthesias and tingling. Pertinent negatives include no fever, no numbness, no weight loss, no abdominal swelling, no bowel incontinence, no perianal numbness, no bladder incontinence, no dysuria, no pelvic pain, no paresis and no weakness. She has tried NSAIDs for the symptoms. The treatment provided no relief.    Past Medical History  Diagnosis Date  . Hyperlipidemia   . Hypertension   . GERD (gastroesophageal reflux disease)     Past Surgical History  Procedure Date  . Hysterectomy other   . Bladder tack 10/2006  . Appendectomy     Family History  Problem Relation Age of Onset  . Hypertension Mother     History  Substance Use Topics  . Smoking status: Former Games developer  . Smokeless tobacco: Never Used  . Alcohol Use: Yes     Comment: socially     OB History    Grav Para Term Preterm Abortions TAB SAB Ect Mult Living                  Review of Systems  Constitutional: Negative for fever, chills, weight loss, diaphoresis, activity change and appetite change.  Gastrointestinal: Negative for bowel incontinence.  Genitourinary: Negative for bladder incontinence, dysuria and pelvic pain.  Musculoskeletal: Positive for back pain. Negative for joint swelling and gait problem.  Neurological: Positive for tingling and paresthesias. Negative for weakness and numbness.    Allergies  Review of patient's allergies indicates no known allergies.  Home Medications   Current Outpatient Rx  Name  Route  Sig  Dispense  Refill  . ALBUTEROL SULFATE HFA 108 (90 BASE) MCG/ACT IN AERS   Inhalation   Inhale 2 puffs into the lungs every 6 (six) hours as needed for wheezing.   3 Inhaler   1     3 month supply   . FLUOXETINE HCL 20 MG PO CAPS   Oral   Take 20 mg by mouth daily.           Marland Kitchen GABAPENTIN 400 MG PO CAPS   Oral   Take 400 mg by mouth. 1 capsule in am and 2 capsule in pm.         . LISINOPRIL 20 MG PO TABS   Oral   Take 1 tablet (20 mg total) by mouth daily.   30 tablet   5   . QUETIAPINE FUMARATE 50  MG PO TABS   Oral   Take 50 mg by mouth at bedtime.         Marland Kitchen CETIRIZINE HCL 10 MG PO TABS   Oral   Take 10 mg by mouth daily.           . CYCLOBENZAPRINE HCL 10 MG PO TABS   Oral   Take 1 tablet (10 mg total) by mouth 2 (two) times daily as needed for muscle spasms.   20 tablet   0   . CYCLOBENZAPRINE HCL 10 MG PO TABS   Oral   Take 1 tablet (10 mg total) by mouth 3 (three) times daily.   21 tablet   0   . HYDROCODONE-IBUPROFEN 7.5-200 MG PO TABS   Oral   Take 1 tablet by mouth every 8 (eight) hours as needed for pain.   15 tablet   0   . NEOMYCIN-POLYMYXIN-HC 3.5-10000-1 OT SUSP   Right Ear   Place 4 drops into the right ear 3 (three) times daily.   10 mL   0   . OMEPRAZOLE 20 MG PO CPDR    Oral   Take 1 capsule (20 mg total) by mouth daily.   30 capsule   5   . PRAVASTATIN SODIUM 20 MG PO TABS   Oral   Take 1 tablet (20 mg total) by mouth daily.   30 tablet   5   . TRAMADOL HCL 50 MG PO TABS   Oral   Take 50 mg by mouth every 6 (six) hours as needed. For knee pain            BP 160/95  Pulse 66  Temp 98.6 F (37 C) (Oral)  Resp 18  SpO2 98%  Physical Exam  Nursing note and vitals reviewed. Constitutional: She is oriented to person, place, and time. Vital signs are normal. She appears well-developed and well-nourished.  Non-toxic appearance. She does not have a sickly appearance. She does not appear ill. No distress.  Abdominal: Soft.  Musculoskeletal: She exhibits tenderness.       Lumbar back: She exhibits decreased range of motion, tenderness and pain. She exhibits no swelling, no edema, no spasm and normal pulse.       Back:       Legs: Neurological: She is alert and oriented to person, place, and time. She displays normal reflexes. No cranial nerve deficit. She exhibits normal muscle tone. Coordination normal.  Skin: Skin is warm. No rash noted. No erythema.    ED Course  Procedures (including critical care time)  Labs Reviewed - No data to display No results found.   1. Sciatic pain   2. Back pain       MDM  Exacerbation of chronic lower back pain, with right sciatica pain. 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 Have encouraged patient to followup with primary care Dr. or orthopedic Dr. Was prescribed a muscle relaxer 5 days of Vicoprofen. Patient agrees with treatment plan and followup care with orthopedic provider as discussed        Jimmie Molly, MD 02/10/12 [phone]

## 2012-03-06 ENCOUNTER — Ambulatory Visit: Payer: No Typology Code available for payment source | Admitting: Internal Medicine

## 2012-03-13 ENCOUNTER — Ambulatory Visit: Payer: No Typology Code available for payment source | Admitting: Family Medicine

## 2012-03-17 ENCOUNTER — Encounter: Payer: Self-pay | Admitting: Internal Medicine

## 2012-03-17 ENCOUNTER — Ambulatory Visit (INDEPENDENT_AMBULATORY_CARE_PROVIDER_SITE_OTHER): Payer: No Typology Code available for payment source | Admitting: Internal Medicine

## 2012-03-17 ENCOUNTER — Ambulatory Visit: Payer: No Typology Code available for payment source | Admitting: Internal Medicine

## 2012-03-17 VITALS — BP 153/95 | HR 76 | Temp 97.2°F | Ht 65.0 in | Wt 185.9 lb

## 2012-03-17 DIAGNOSIS — R200 Anesthesia of skin: Secondary | ICD-10-CM | POA: Insufficient documentation

## 2012-03-17 DIAGNOSIS — K047 Periapical abscess without sinus: Secondary | ICD-10-CM

## 2012-03-17 DIAGNOSIS — K0530 Chronic periodontitis, unspecified: Secondary | ICD-10-CM

## 2012-03-17 DIAGNOSIS — M5432 Sciatica, left side: Secondary | ICD-10-CM | POA: Insufficient documentation

## 2012-03-17 DIAGNOSIS — M5430 Sciatica, unspecified side: Secondary | ICD-10-CM

## 2012-03-17 DIAGNOSIS — R209 Unspecified disturbances of skin sensation: Secondary | ICD-10-CM

## 2012-03-17 MED ORDER — HYDROCODONE-ACETAMINOPHEN 5-300 MG PO TABS: 1.0000 | ORAL_TABLET | Freq: Four times a day (QID) | ORAL | Status: DC | PRN

## 2012-03-17 MED ORDER — CYCLOBENZAPRINE HCL 10 MG PO TABS: 10.0000 mg | ORAL_TABLET | Freq: Two times a day (BID) | ORAL | Status: DC | PRN

## 2012-03-17 MED ORDER — AMOXICILLIN 500 MG PO CAPS: 500.0000 mg | ORAL_CAPSULE | Freq: Three times a day (TID) | ORAL | Status: DC

## 2012-03-17 NOTE — Patient Instructions (Signed)
General Instructions: Please schedule a follow up appointment in 1-2 months . Please bring your medication bottles with your next appointment. Please take your medicines as prescribed. Please use heat or ice to the affected area Sciatica Sciatica is pain, weakness, numbness, or tingling along the path of the sciatic nerve. The nerve starts in the lower back and runs down the back of each leg. The nerve controls the muscles in the lower leg and in the back of the knee, while also providing sensation to the back of the thigh, lower leg, and the sole of your foot. Sciatica is a symptom of another medical condition. For instance, nerve damage or certain conditions, such as a herniated disk or bone spur on the spine, pinch or put pressure on the sciatic nerve. This causes the pain, weakness, or other sensations normally associated with sciatica. Generally, sciatica only affects one side of the body. CAUSES   Herniated or slipped disc.  Degenerative disk disease.  A pain disorder involving the narrow muscle in the buttocks (piriformis syndrome).  Pelvic injury or fracture.  Pregnancy.  Tumor (rare). SYMPTOMS  Symptoms can vary from mild to very severe. The symptoms usually travel from the low back to the buttocks and down the back of the leg. Symptoms can include:  Mild tingling or dull aches in the lower back, leg, or hip.  Numbness in the back of the calf or sole of the foot.  Burning sensations in the lower back, leg, or hip.  Sharp pains in the lower back, leg, or hip.  Leg weakness.  Severe back pain inhibiting movement. These symptoms may get worse with coughing, sneezing, laughing, or prolonged sitting or standing. Also, being overweight may worsen symptoms. DIAGNOSIS  Your caregiver will perform a physical exam to look for common symptoms of sciatica. He or she may ask you to do certain movements or activities that would trigger sciatic nerve pain. Other tests may be performed  to find the cause of the sciatica. These may include:  Blood tests.  X-rays.  Imaging tests, such as an MRI or CT scan. TREATMENT  Treatment is directed at the cause of the sciatic pain. Sometimes, treatment is not necessary and the pain and discomfort goes away on its own. If treatment is needed, your caregiver may suggest:  Over-the-counter medicines to relieve pain.  Prescription medicines, such as anti-inflammatory medicine, muscle relaxants, or narcotics.  Applying heat or ice to the painful area.  Steroid injections to lessen pain, irritation, and inflammation around the nerve.  Reducing activity during periods of pain.  Exercising and stretching to strengthen your abdomen and improve flexibility of your spine. Your caregiver may suggest losing weight if the extra weight makes the back pain worse.  Physical therapy.  Surgery to eliminate what is pressing or pinching the nerve, such as a bone spur or part of a herniated disk. HOME CARE INSTRUCTIONS   Only take over-the-counter or prescription medicines for pain or discomfort as directed by your caregiver.  Apply ice to the affected area for 20 minutes, 3 4 times a day for the first 48 72 hours. Then try heat in the same way.  Exercise, stretch, or perform your usual activities if these do not aggravate your pain.  Attend physical therapy sessions as directed by your caregiver.  Keep all follow-up appointments as directed by your caregiver.  Do not wear high heels or shoes that do not provide proper support.  Check your mattress to see if it is  too soft. A firm mattress may lessen your pain and discomfort. SEEK IMMEDIATE MEDICAL CARE IF:   You lose control of your bowel or bladder (incontinence).  You have increasing weakness in the lower back, pelvis, buttocks, or legs.  You have redness or swelling of your back.  You have a burning sensation when you urinate.  You have pain that gets worse when you lie down or  awakens you at night.  Your pain is worse than you have experienced in the past.  Your pain is lasting longer than 4 weeks.  You are suddenly losing weight without reason. MAKE SURE YOU:  Understand these instructions.  Will watch your condition.  Will get help right away if you are not doing well or get worse. Document Released: 01/22/2001 Document Revised: 07/30/2011 Document Reviewed: 06/09/2011 [facility] Patient Information 2013 [place], [place].     Treatment Goals:  Goals (1 Years of Data) as of 03/17/2012    None      Progress Toward Treatment Goals:  Treatment Goal 03/17/2012  Blood pressure unchanged    Self Care Goals & Plans:  Self Care Goal 03/17/2012  Manage my medications take my medicines as prescribed; bring my medications to every visit; refill my medications on time  Eat healthy foods drink diet soda or water instead of juice or soda; eat more vegetables       Care Management & Community Referrals:

## 2012-03-17 NOTE — Assessment & Plan Note (Signed)
She reports having severe back pain radiating all the way down to her left leg for last 3 days associated with tingling and numbness. Her lumbar spine x-ray from 2011 was reviewed that showed moderate to severe degenerative disc disease. I think her pain is likely consistent with sciatica. She has tried over-the-counter 200 mg of ibuprofen without any relief. -She was advised to take 600 mg of ibuprofen every 8 hours. -Was also given the prescription for Flexeril and 15 tablets of Vicodin for severe pain. -She was advised to minimize her physical activity to her tolerance. -Advised against bending, twisting or lifting any heavy weights. -Advised to apply heat/ice to the affected area -Call the clinic if her pain doesn't get better in a week. -She was also given a work note- to return to her work in 3-4 days, is she is comfortable.

## 2012-03-17 NOTE — Progress Notes (Signed)
Subjective:   [name] female   DOB: 1950-10-31 62 y.o.   MRN: [id number]  HPI: 62 year old woman with past medical history significant for hypertension, GERD, degenerative disc disease presents to the clinic for left leg pain x 3 days.   Patient reports having pain in her back that radiates her left leg all the way down to the ankle for last 3 days. She describes her pain as sharp pain associated with tingling and numbness. She currently rates her pain 10 out of 10 and is tearful during the conversation. She has tried over-the-counter 200 mg of ibuprofen but that didn't help her. She has not been able to sleep for last 3 nights because of severe pain. Having some pillows underneath her leg gives her some relief. She doesn't remember any back injury or trauma but she might have lifted some heavy material at her work place which she is not sure about. Denies any  bladder or bowel incontinence, saddle anesthesia, fever, dysuria or pelvic pain.  She works at [employer].Patient has had similar problem with her right leg in the past ( Dec 2013- ED visit)  She also reports having numbness in the first 3 digits of her fingers of both hands for last few months. She works at [employer]ilities [employer] and does some typing at a computer.  She also reports having some dental infection as she can taste something weird and would like to get evaluated. Denies any pain associated with it. She is requesting some antibiotics for that.    Past Medical History  Diagnosis Date  . Hyperlipidemia   . Hypertension   . GERD (gastroesophageal reflux disease)    Family History  Problem Relation Age of Onset  . Hypertension Mother    History   Social History  . Marital Status: Divorced    Spouse Name: N/A    Number of Children: N/A  . Years of Education: N/A   Occupational History  . Not on file.   Social History Main Topics  . Smoking status: Former Games developer  . Smokeless tobacco: Never Used  .  Alcohol Use: Yes     Comment: socially  . Drug Use: No  . Sexually Active: Not on file   Other Topics Concern  . Not on file   Social History Narrative   Single. 9 siblings. 1 brother with HIV, 1 sister with hypertension. 4 children, 1 daughter with HTN and depression, 1 son with HTN.[phone]- shelter's number. Former smoker, no alcohol or drug use.   Review of Systems: General: Denies fever, chills, diaphoresis, appetite change and fatigue. HEENT: Denies photophobia, eye pain, redness, hearing loss, ear pain, congestion, sore throat, rhinorrhea, sneezing, mouth sores, trouble swallowing, neck pain, neck stiffness and tinnitus. Respiratory: Denies SOB, DOE, cough, chest tightness, and wheezing. Cardiovascular: Denies to chest pain, palpitations and leg swelling. Gastrointestinal: Denies nausea, vomiting, abdominal pain, diarrhea, constipation, blood in stool and abdominal distention. Genitourinary: Denies dysuria, urgency, frequency, hematuria, flank pain and difficulty urinating. Musculoskeletal: Denies myalgias, , joint swelling, arthralgias and gait problem, + back pain .  Skin: Denies pallor, rash and wound. Neurological: Denies dizziness, seizures, syncope, weakness, light-headedness, numbness and headaches. Hematological: Denies adenopathy, easy bruising, personal or family bleeding history. Psychiatric/Behavioral: Denies suicidal ideation, mood changes, confusion, nervousness, sleep disturbance and agitation.    Current Outpatient Medications: Current Outpatient Prescriptions  Medication Sig Dispense Refill  . albuterol (PROVENTIL HFA) 108 (90 BASE) MCG/ACT inhaler Inhale 2 puffs into the lungs every 6 (six)  hours as needed for wheezing.  3 Inhaler  1  . cetirizine (ZYRTEC) 10 MG tablet Take 10 mg by mouth daily.        . cyclobenzaprine (FLEXERIL) 10 MG tablet Take 1 tablet (10 mg total) by mouth 2 (two) times daily as needed for muscle spasms.  20 tablet  0  . FLUoxetine  (PROZAC) 20 MG capsule Take 20 mg by mouth daily.        Marland Kitchen gabapentin (NEURONTIN) 400 MG capsule Take 400 mg by mouth. 1 capsule in am and 2 capsule in pm.      . lisinopril (PRINIVIL,ZESTRIL) 20 MG tablet Take 1 tablet (20 mg total) by mouth daily.  30 tablet  5  . neomycin-polymyxin-hydrocortisone (CORTISPORIN) 3.5-10000-1 otic suspension Place 4 drops into the right ear 3 (three) times daily.  10 mL  0  . omeprazole (PRILOSEC) 20 MG capsule Take 1 capsule (20 mg total) by mouth daily.  30 capsule  5  . pravastatin (PRAVACHOL) 20 MG tablet Take 1 tablet (20 mg total) by mouth daily.  30 tablet  5  . QUEtiapine (SEROQUEL) 50 MG tablet Take 50 mg by mouth at bedtime.      . traMADol (ULTRAM) 50 MG tablet Take 50 mg by mouth every 6 (six) hours as needed. For knee pain         Allergies: No Known Allergies    Objective:   Physical Exam: Filed Vitals:   03/17/12 1335  BP: 153/95  Pulse: 76  Temp: 97.2 F (36.2 C)    General: Vital signs reviewed and noted. Well-developed, well-nourished, in no acute distress; alert, appropriate and cooperative throughout examination. Head: Normocephalic, atraumatic Lungs: Normal respiratory effort. Clear to auscultation BL without crackles or wheezes. Heart: RRR. S1 and S2 normal without gallop, murmur, or rubs. Abdomen:BS normoactive. Soft, Nondistended, non-tender.  No masses or organomegaly. Extremities: No pretibial edema. MSK: Left SLRT positive     Assessment & Plan:

## 2012-03-17 NOTE — Assessment & Plan Note (Signed)
Patient reports having bad taste and halitosis. On exam she was noticed to have dental carie around the left lower canine and some evidence of chronic periodontitis. - Would treat her with 7 days of amoxicillin. -She is on a waiting list for dental appointment through [facility].She also qualifies for the free dental clinic in April. We'll try to arrange either of the 2 options for the patient.

## 2012-03-17 NOTE — Assessment & Plan Note (Signed)
She reports having numbness and tingling in the first 3 digits of both hands for last few months. Differentials include carpal tunnel syndrome versus cervical radiculopathy. I think this is most likely carpal tunnel syndrome given the distribution of tingling and numbness. Her labs from 09/13 showed normal electrolytes.  - Patient was advised to use wrist splints to see if that helps controlling her symptoms

## 2012-03-20 ENCOUNTER — Ambulatory Visit: Payer: No Typology Code available for payment source | Admitting: Family Medicine

## 2012-03-31 ENCOUNTER — Ambulatory Visit: Payer: Self-pay

## 2012-04-01 ENCOUNTER — Ambulatory Visit: Payer: No Typology Code available for payment source | Admitting: Internal Medicine

## 2012-04-06 ENCOUNTER — Encounter: Payer: Self-pay | Admitting: Internal Medicine

## 2012-04-06 ENCOUNTER — Ambulatory Visit (INDEPENDENT_AMBULATORY_CARE_PROVIDER_SITE_OTHER): Payer: Self-pay | Admitting: Internal Medicine

## 2012-04-06 VITALS — BP 142/95 | HR 90 | Temp 97.0°F | Ht 65.0 in | Wt 187.7 lb

## 2012-04-06 DIAGNOSIS — Z139 Encounter for screening, unspecified: Secondary | ICD-10-CM

## 2012-04-06 DIAGNOSIS — Z113 Encounter for screening for infections with a predominantly sexual mode of transmission: Secondary | ICD-10-CM

## 2012-04-06 DIAGNOSIS — M25569 Pain in unspecified knee: Secondary | ICD-10-CM

## 2012-04-06 DIAGNOSIS — Z0000 Encounter for general adult medical examination without abnormal findings: Secondary | ICD-10-CM

## 2012-04-06 DIAGNOSIS — Z79899 Other long term (current) drug therapy: Secondary | ICD-10-CM

## 2012-04-06 DIAGNOSIS — M25562 Pain in left knee: Secondary | ICD-10-CM

## 2012-04-06 DIAGNOSIS — E785 Hyperlipidemia, unspecified: Secondary | ICD-10-CM

## 2012-04-06 DIAGNOSIS — M1712 Unilateral primary osteoarthritis, left knee: Secondary | ICD-10-CM | POA: Insufficient documentation

## 2012-04-06 DIAGNOSIS — I10 Essential (primary) hypertension: Secondary | ICD-10-CM

## 2012-04-06 HISTORY — DX: Unilateral primary osteoarthritis, left knee: M17.12

## 2012-04-06 LAB — COMPLETE METABOLIC PANEL WITH GFR
ALT: 19 U/L (ref 0–35)
BUN: 10 mg/dL (ref 6–23)
CO2: 28 mEq/L (ref 19–32)
Calcium: 9.6 mg/dL (ref 8.4–10.5)
Chloride: 102 mEq/L (ref 96–112)
Creat: 0.9 mg/dL (ref 0.50–1.10)
GFR, Est African American: 80 mL/min
GFR, Est Non African American: 70 mL/min
Total Bilirubin: 0.3 mg/dL (ref 0.3–1.2)

## 2012-04-06 LAB — LIPID PANEL
HDL: 43 mg/dL (ref >39)
LDL Cholesterol: 194 mg/dL — ABNORMAL HIGH (ref 0–99)
Total CHOL/HDL Ratio: 6.5 Ratio
Triglycerides: 204 mg/dL — ABNORMAL HIGH (ref <150)

## 2012-04-06 LAB — GLUCOSE, CAPILLARY: Glucose-Capillary: 93 mg/dL (ref 70–99)

## 2012-04-06 LAB — TSH: TSH: 1.474 u[IU]/mL (ref 0.350–4.500)

## 2012-04-06 LAB — HIV ANTIBODY (ROUTINE TESTING W REFLEX): HIV: NONREACTIVE

## 2012-04-06 LAB — POCT GLYCOSYLATED HEMOGLOBIN (HGB A1C): Hemoglobin A1C: 5.4

## 2012-04-06 NOTE — Patient Instructions (Signed)
General Instructions: Please schedule a follow up appointment in 1-2 months . Please bring your medication bottles with your next appointment. Please take your medicines as prescribed. I will call you with your lab results if anything will be abnormal.    Treatment Goals:  Goals (1 Years of Data) as of 04/06/12   None      Progress Toward Treatment Goals:  Treatment Goal 03/17/2012  Blood pressure unchanged    Self Care Goals & Plans:  Self Care Goal 04/06/2012  Manage my medications take my medicines as prescribed; bring my medications to every visit; refill my medications on time; follow the sick day instructions if I am sick  Monitor my health keep track of my blood pressure; keep track of my weight  Eat healthy foods eat more vegetables; eat fruit for snacks and desserts; eat smaller portions; eat foods that are low in salt  Be physically active take a walk every day; find an activity I enjoy       Care Management & Community Referrals:

## 2012-04-06 NOTE — Progress Notes (Signed)
Subjective:   [name] female   DOB: [birth year] 62 y.o.   MRN: [id number]  HPI: 62 -year-old woman with past medical history and significant for hypertension, hyperlipidemia and GERD presents to the clinic for left knee pain for 2 weeks  Patient reports that she started having left knee pain associated with radiation to the thigh and calf, since she stopped taking her Vicodin( that was given to her for left sciatica on 2/4). She rates her pain 10 out of 10, describes it as sharp pain , associated with some swelling in the posterior part of her knee, muscle tightness but no redness. Denies any trauma or fall. Patient report having great difficulty in getting up from sitting position.   She requests to be screened for diabetes, hypercholesterinemia and would also like to get herself tested for HIV.    Past Medical History  Diagnosis Date  . Hyperlipidemia   . Hypertension   . GERD (gastroesophageal reflux disease)    Family History  Problem Relation Age of Onset  . Hypertension Mother    History   Social History  . Marital Status: Divorced    Spouse Name: N/A    Number of Children: N/A  . Years of Education: N/A   Occupational History  . Not on file.   Social History Main Topics  . Smoking status: Former Games developer  . Smokeless tobacco: Never Used  . Alcohol Use: Yes     Comment: socially  . Drug Use: No  . Sexually Active: Not on file   Other Topics Concern  . Not on file   Social History Narrative   Single. 9 siblings. 1 brother with HIV, 1 sister with hypertension. 4 children, 1 daughter with HTN and depression, 1 son with HTN.   [phone]- shelter's number.    Former smoker, no alcohol or drug use.   Review of Systems: General: Denies fever, chills, diaphoresis, appetite change and fatigue. HEENT: Denies photophobia, eye pain, redness, hearing loss, ear pain, congestion, sore throat, rhinorrhea, sneezing, mouth sores, trouble swallowing, neck pain,  neck stiffness and tinnitus. Respiratory: Denies SOB, DOE, cough, chest tightness, and wheezing. Cardiovascular: Denies to chest pain, palpitations and leg swelling. Gastrointestinal: Denies nausea, vomiting, abdominal pain, diarrhea, constipation, blood in stool and abdominal distention. Genitourinary: Denies dysuria, urgency, frequency, hematuria, flank pain and difficulty urinating. Musculoskeletal: Denies myalgias, back pain, joint swelling, arthralgias and gait problem. + left leg pain , + left knee pain Skin: Denies pallor, rash and wound. Neurological: Denies dizziness, seizures, syncope, weakness, light-headedness, numbness and headaches. Hematological: Denies adenopathy, easy bruising, personal or family bleeding history. Psychiatric/Behavioral: Denies suicidal ideation, mood changes, confusion, nervousness, sleep disturbance and agitation.    Current Outpatient Medications: Current Outpatient Prescriptions  Medication Sig Dispense Refill  . albuterol (PROVENTIL HFA) 108 (90 BASE) MCG/ACT inhaler Inhale 2 puffs into the lungs every 6 (six) hours as needed for wheezing.  3 Inhaler  1  . amoxicillin (AMOXIL) 500 MG capsule Take 1 capsule (500 mg total) by mouth 3 (three) times daily.  21 capsule  0  . cetirizine (ZYRTEC) 10 MG tablet Take 10 mg by mouth daily.        . cyclobenzaprine (FLEXERIL) 10 MG tablet Take 1 tablet (10 mg total) by mouth 2 (two) times daily as needed for muscle spasms.  30 tablet  0  . FLUoxetine (PROZAC) 20 MG capsule Take 20 mg by mouth daily.        Marland Kitchen gabapentin (NEURONTIN)  400 MG capsule Take 400 mg by mouth. 1 capsule in am and 2 capsule in pm.      . Hydrocodone-Acetaminophen 5-300 MG TABS Take 1 tablet by mouth every 6 (six) hours as needed.  15 each  0  . lisinopril (PRINIVIL,ZESTRIL) 20 MG tablet Take 1 tablet (20 mg total) by mouth daily.  30 tablet  5  . neomycin-polymyxin-hydrocortisone (CORTISPORIN) 3.5-10000-1 otic suspension Place 4 drops into the  right ear 3 (three) times daily.  10 mL  0  . omeprazole (PRILOSEC) 20 MG capsule Take 1 capsule (20 mg total) by mouth daily.  30 capsule  5  . pravastatin (PRAVACHOL) 20 MG tablet Take 1 tablet (20 mg total) by mouth daily.  30 tablet  5  . QUEtiapine (SEROQUEL) 50 MG tablet Take 50 mg by mouth at bedtime.      . traMADol (ULTRAM) 50 MG tablet Take 50 mg by mouth every 6 (six) hours as needed. For knee pain        No current facility-administered medications for this visit.    Allergies: No Known Allergies    Objective:   Physical Exam: Filed Vitals:   04/06/12 1402  BP: 142/95  Pulse: 90  Temp: 97 F (36.1 C)    General: Vital signs reviewed and noted. Well-developed, well-nourished, in no acute distress; alert, appropriate and cooperative throughout examination. Head: Normocephalic, atraumatic Lungs: Normal respiratory effort. Clear to auscultation BL without crackles or wheezes. Heart: RRR. S1 and S2 normal without gallop, murmur, or rubs. Abdomen:BS normoactive. Soft, Nondistended, non-tender.  No masses or organomegaly. Extremities: No pretibial edema. Left knee has crepitus, small swelling on the posterior aspect, no erythema, painful extension. SLRT negative     Assessment & Plan:

## 2012-04-07 MED ORDER — PRAVASTATIN SODIUM 40 MG PO TABS: 40.0000 mg | ORAL_TABLET | Freq: Every day | ORAL | Status: DC

## 2012-04-07 NOTE — Assessment & Plan Note (Signed)
Patient presents with some left knee pain associated with swelling and muscle tightness in the posterior aspect. On exam, she was noted to have some skin thickening/?  Very Small effusion in the popliteal fossa. DD includes Osteoarthritis vs baker's cysts. I am mot sure what the exact etiology is but on my exam the appearance of the swelling was not consistent with Baker's cyst. This is most likely form her OA.  Patellofemoral syndrome seem less likely given the absence of pain with moving the anterior knee . Illiotibial band syndrome also appears less likely given the absence of distribution of the pain in the lateral aspect.  - Apply heat or ice to the affected area - Ibuprofen 400 mg q6, to help with pain - Call the clinic if the swelling progresses in size or gets worse.  - She was offered physical therapy but patient would like to hold off until her pain and swelling improves.

## 2012-04-07 NOTE — Assessment & Plan Note (Signed)
BP mildly elevated. Continue current meds. Check CMET.

## 2012-04-07 NOTE — Assessment & Plan Note (Signed)
Check lipid panel and CMET. She has not been taking her pravastatin.  Update: her LDL is elevated to 194 mg/dl. Given her risk factors of Age>55, being on anti- HTN meds, goal LDL would be 130 mg/dl. Patient was called and informed about the results.  I emphasized her the need to take pravastatin daily. Would send prescription to pharmacy.

## 2012-04-08 ENCOUNTER — Ambulatory Visit: Payer: Self-pay

## 2012-04-17 ENCOUNTER — Ambulatory Visit: Payer: Self-pay | Admitting: Family Medicine

## 2012-04-21 ENCOUNTER — Other Ambulatory Visit: Payer: Self-pay | Admitting: *Deleted

## 2012-04-21 DIAGNOSIS — M25562 Pain in left knee: Secondary | ICD-10-CM

## 2012-04-23 ENCOUNTER — Ambulatory Visit
Admission: RE | Admit: 2012-04-23 | Discharge: 2012-04-23 | Disposition: A | Discharge status: Home / Self Care | Payer: Self-pay | Source: Ambulatory Visit | Attending: Family Medicine | Admitting: Family Medicine

## 2012-04-23 DIAGNOSIS — M25562 Pain in left knee: Secondary | ICD-10-CM

## 2012-04-24 ENCOUNTER — Ambulatory Visit: Payer: Self-pay | Admitting: Family Medicine

## 2012-04-24 ENCOUNTER — Ambulatory Visit (INDEPENDENT_AMBULATORY_CARE_PROVIDER_SITE_OTHER): Payer: Self-pay | Admitting: Family Medicine

## 2012-04-24 VITALS — BP 145/94 | Ht 65.0 in | Wt 187.0 lb

## 2012-04-24 DIAGNOSIS — M25569 Pain in unspecified knee: Secondary | ICD-10-CM

## 2012-04-24 DIAGNOSIS — M25562 Pain in left knee: Secondary | ICD-10-CM

## 2012-04-24 MED ORDER — MELOXICAM 15 MG PO TABS: ORAL_TABLET | ORAL | Status: DC

## 2012-04-24 MED ORDER — HYDROCODONE-ACETAMINOPHEN 5-325 MG PO TABS: 1.0000 | ORAL_TABLET | Freq: Three times a day (TID) | ORAL | Status: DC | PRN

## 2012-04-24 NOTE — Patient Instructions (Signed)
Good to see you I do think you need to see a orthopaedic surgeon sometime in the near future.  To help that this injection does help some of the pain. I have also given new medicine, meloxicam. Take one pill daily for the next 10 days then as needed thereafter. I will also give you some mild Narco to have on hand in case you should really need it. We are here if you need [name] and we can try to take the fluid off again if it does not get better in 2-3 weeks.  Try getting a compression sleeve to help keep the swelling down.

## 2012-04-24 NOTE — Progress Notes (Signed)
Chief complaint: Left knee pain  Patient is a 62 year-old female who's had left knee pain for quite some time. Patient has an MRI from one year ago that showed that she had a meniscal tear. This is reviewed again today. When reviewing this MRI in it appears there is a posterior horn medial meniscal tear. Patient was unable to go to the orthopedic surgeon there she was scheduled to see secondary to family matters. Patient states that his knee pain has gotten significantly worse over the course of the last 2 months. Patient had been lifting boxes up to 50 pounds and noticed that this was causing more discomfort in her knee. Patient started having swelling over the last 2-1/2 weeks. Patient describes the pain as a dull aching sensation with sharp pains with certain movements. Patient states that she has not had any clicking but does feel that it locks and gives out on her from time to time. Patient denies any radiation of pain he denies any numbness. Patient is still able to do all activities of daily living but is having significant more pain than usual. She does state that she has some nighttime awakening.  Past medical history, social, surgical and family history all reviewed.   Physical exam Blood pressure 145/94, height 5\' 5"  (1.651 m), weight 187 lb (84.823 kg). General: No apparent distress alert and oriented x3 mood and affect normal Respiratory: Patient's speak in full sentences and does not appear short of breath Skin: Warm dry intact with no signs of infection or rash Neuro: Cranial nerves II through XII are intact, neurovascularly intact in all extremities with 2+ DTRs and 2+ pulses. Knee exam: On inspection patient's left knee does have a 2+ effusion. She is tender to palpation. Patient has full extension and flexion to 75. She is neurovascularly intact distally. Ligaments appear to be intact.  After verbal and written consent patient was prepped with 2 alcohol swabs and Betadine. She  didn't have injected 5 cc of 1% lidocaine with a 25-gauge 1-1/2 inch needle. He should then did have an 18-gauge 1-1/2 inch needle inserted into the skin which patient did not tolerate well. We were unable to get any fluid aspiration removed. The patient did have injected and 1 cc of 80 mg Depo-Medrol. Patient did have some relief of pain at the end of this.

## 2012-04-28 ENCOUNTER — Ambulatory Visit (INDEPENDENT_AMBULATORY_CARE_PROVIDER_SITE_OTHER): Payer: Self-pay | Admitting: Internal Medicine

## 2012-04-28 ENCOUNTER — Ambulatory Visit: Payer: Self-pay

## 2012-04-28 ENCOUNTER — Encounter: Payer: Self-pay | Admitting: Internal Medicine

## 2012-04-28 VITALS — BP 148/97 | HR 102 | Temp 98.4°F | Ht 65.0 in | Wt 190.3 lb

## 2012-04-28 DIAGNOSIS — D649 Anemia, unspecified: Secondary | ICD-10-CM

## 2012-04-28 DIAGNOSIS — I10 Essential (primary) hypertension: Secondary | ICD-10-CM

## 2012-04-28 DIAGNOSIS — N816 Rectocele: Secondary | ICD-10-CM

## 2012-04-28 DIAGNOSIS — Z1239 Encounter for other screening for malignant neoplasm of breast: Secondary | ICD-10-CM

## 2012-04-28 LAB — IRON AND TIBC
%SAT: 14 % — ABNORMAL LOW (ref 20–55)
Iron: 53 ug/dL (ref 42–145)
TIBC: 377 ug/dL (ref 250–470)
UIBC: 324 ug/dL (ref 125–400)

## 2012-04-28 LAB — VITAMIN B12: Vitamin B-12: 454 pg/mL (ref 211–911)

## 2012-04-28 MED ORDER — LISINOPRIL-HYDROCHLOROTHIAZIDE 20-12.5 MG PO TABS: 1.0000 | ORAL_TABLET | Freq: Every day | ORAL | Status: DC

## 2012-04-28 MED ORDER — LISINOPRIL 20 MG PO TABS: 30.0000 mg | ORAL_TABLET | Freq: Every day | ORAL | Status: DC

## 2012-04-28 NOTE — Patient Instructions (Addendum)
1. Increase lisinopril to 30mg  daily. You can take 1 and 1/2 tablets until you fill your new prescription. 2. You need to see a women's health specialist for your symptoms of pressure. You have a rectocele, which means that your vaginal wall is weak and your bowels are partially protruding. I have made a referral to [facility] for both evaluation of rectocele and your mammogram.  3. Come back and see me in 1-2 months.

## 2012-04-28 NOTE — Assessment & Plan Note (Signed)
Chart review reveals that patient went to [facility] for evaluation of similar sx in 01/2010, was diagnosed with rectocele. U/S examination was ordered but patient lost to F/U.  No evidence of infection, bowel infarction, difficulty defecating. Patient with history of cystocele in past s/p surgical repair. At risk for weakness of pelvic floor with multiparous state [phone]) She needs to go back to [facility] to discuss conservative vs surgical mgmt of rectocele.  - referral to [facility] for further w/u and mgmt recotcele - referral for screening MMG, hopefully to be completed same day

## 2012-04-28 NOTE — Assessment & Plan Note (Signed)
BP Readings from Last 3 Encounters:  04/28/12 148/97  04/24/12 145/94  04/06/12 142/95    Lab Results  Component Value Date   NA 140 04/06/2012   K 3.7 04/06/2012   CREATININE 0.90 04/06/2012    Assessment:  Blood pressure control:  Mildly elevated  Progress toward BP goal:   Unchanged    Plan:  Medications:  Change from lisinopril to lisinopril/HCTZ 20/12.5, patient reports HCTZ has helped her in the past.   Educational resources provided: brochure

## 2012-04-28 NOTE — Assessment & Plan Note (Signed)
Lab Results  Component Value Date   WBC 5.1 07/23/2011   HGB 11.2* 07/23/2011   HCT 35.1* 07/23/2011   MCV 82.2 07/23/2011   PLT 331 07/23/2011   Lab Results  Component Value Date   IRON 27* 07/22/2008   TIBC 299 07/22/2008   FERRITIN 36 11/21/2010   Patient with h/o normocytic anemia and iron deficiency in the past. No current active blood loss. Last colonoscopy 3 years ago with benign polyps without adenomatous change. Will recheck iron as well as B12/folate. Will f/u on results and replace as needed.

## 2012-04-28 NOTE — Progress Notes (Signed)
[name], female   DOB: [birth year], 62 y.o.   MRN: [id number]  Subjective:   [name] female   DOB: October 11, 1950 62 y.o.   MRN: [id number]  HPI: Olivia Ewing is a 62 y.o. female with history of HTN, chronic R knee pain with meniscal tear, polysubstance abuse and depression presenting to the clinic with complaints of vaginal fullness and discomfort. [name] reports that for the past 3 days, she has had a sensation of vaginal fulness and pressure. Worse with straining. Similar to symptoms she had with cystocele before surgical repair in 2008. Says that she went to [facility] for similar feeling a couple years ago and that they ordered an ultrasound which she never had done because she felt better. Denies mass protruding from vagina. Denies vaginal pruritis, dysuria, discharge, hematuria, urinary hesitancy, constipation, tenesmus, abdominal pain, hematochezia, melena. Denies fever, chills, dizziness, N/V.   Past Medical History  Diagnosis Date  . Hyperlipidemia   . Hypertension   . GERD (gastroesophageal reflux disease)   . Depression   . Right knee pain     posterior horn medial meniscal tear MRI 2013  . Lumbar radiculopathy, chronic   . Cervicalgia    Current Outpatient Prescriptions  Medication Sig Dispense Refill  . albuterol (PROVENTIL HFA) 108 (90 BASE) MCG/ACT inhaler Inhale 2 puffs into the lungs every 6 (six) hours as needed for wheezing.  3 Inhaler  1  . cetirizine (ZYRTEC) 10 MG tablet Take 10 mg by mouth daily.        . cyclobenzaprine (FLEXERIL) 10 MG tablet Take 1 tablet (10 mg total) by mouth 2 (two) times daily as needed for muscle spasms.  30 tablet  0  . FLUoxetine (PROZAC) 20 MG capsule Take 20 mg by mouth daily.        Marland Kitchen gabapentin (NEURONTIN) 400 MG capsule Take 400 mg by mouth. 1 capsule in am and 2 capsule in pm.      . HYDROcodone-acetaminophen (NORCO) 5-325 MG per tablet Take 1 tablet by mouth every 8 (eight) hours  as needed for pain.  30 tablet  0  . lisinopril-hydrochlorothiazide (PRINZIDE,ZESTORETIC) 20-12.5 MG per tablet Take 1 tablet by mouth daily.  30 tablet  5  . meloxicam (MOBIC) 15 MG tablet Taken daily for 10 days then as needed thereafter.  30 tablet  2  . neomycin-polymyxin-hydrocortisone (CORTISPORIN) 3.5-10000-1 otic suspension Place 4 drops into the right ear 3 (three) times daily.  10 mL  0  . omeprazole (PRILOSEC) 20 MG capsule Take 1 capsule (20 mg total) by mouth daily.  30 capsule  5  . pravastatin (PRAVACHOL) 40 MG tablet Take 1 tablet (40 mg total) by mouth daily.  30 tablet  5  . QUEtiapine (SEROQUEL) 50 MG tablet Take 50 mg by mouth at bedtime.      . traMADol (ULTRAM) 50 MG tablet Take 50 mg by mouth every 6 (six) hours as needed. For knee pain        No current facility-administered medications for this visit.   Family History  Problem Relation Age of Onset  . Hypertension Mother    History   Social History  . Marital Status: Divorced    Spouse Name: N/A    Number of Children: N/A  . Years of Education: N/A   Social History Main Topics  . Smoking status: Former Games developer  . Smokeless tobacco: Never Used  . Alcohol Use: Yes  Comment: socially  . Drug Use: No  . Sexually Active: None   Other Topics Concern  . None   Social History Narrative   Single. 9 siblings. 1 brother with HIV, 1 sister with hypertension. 4 children, 1 daughter with HTN and depression, 1 son with HTN.   [phone]- shelter's number.    Former smoker, no alcohol or drug use.   Review of Systems: 10 pt ROS performed, pertinent positives and negatives noted in HPI Objective:  Physical Exam: Filed Vitals:   04/28/12 1416  BP: 148/97  Pulse: 102  Temp: 98.4 F (36.9 C)  TempSrc: Oral  Height: 5\' 5"  (1.651 m)  Weight: 190 lb 4.8 oz (86.32 kg)   Constitutional: Vital signs reviewed.  Patient is an overweight female in no acute distress and cooperative with exam. Alert and oriented x3.   Head: Normocephalic and atraumatic Eyes: PERRL, EOMI, conjunctivae normal, No scleral icterus.  Neck: Supple, Trachea midline normal ROM; no JVD or thyromegaly Cardiovascular: RRR, S1 normal, S2 normal, no MRG, pulses symmetric and intact bilaterally Pulmonary/Chest: CTAB, no wheezes, rales, or rhonchi Abdominal: Soft. Some suprapubic TTP. Bowel sounds are normal, no masses, organomegaly, or guarding present.  Pelvic exam: soft pink 3x3 cm mass at vaginal introitus, protruding from posterior vaginal wall. Protrusion increases with valsalva. No erythema/exudate. Cervix surgically absent with dark suture material at cuff. No adnexal tenderness.  Neurological: A&O x3, Strength is normal and symmetric bilaterally, cranial nerve II-XII are grossly intact, no focal motor deficit, sensory intact to light touch bilaterally.  Skin: Warm, dry and intact. No rash, cyanosis, or clubbing.  Psychiatric: Normal mood and affect.  Assessment & Plan:   Please see problem-based charting for assessment and plan.

## 2012-05-11 ENCOUNTER — Ambulatory Visit (HOSPITAL_COMMUNITY): Payer: Self-pay | Attending: Internal Medicine

## 2012-05-21 ENCOUNTER — Other Ambulatory Visit: Payer: Self-pay | Admitting: *Deleted

## 2012-05-21 MED ORDER — HYDROCODONE-ACETAMINOPHEN 5-325 MG PO TABS: 1.0000 | ORAL_TABLET | Freq: Three times a day (TID) | ORAL | Status: DC | PRN

## 2012-05-21 NOTE — Progress Notes (Signed)
Pt still in pain with knee. Will give pt #30 of Norco until her appt on the 25th of this month. Pt will NOT give additional refills until this appt date is kept, no excuses. Pt agreed.

## 2012-05-22 ENCOUNTER — Telehealth: Payer: Self-pay | Admitting: *Deleted

## 2012-05-22 NOTE — Telephone Encounter (Signed)
Pt called with c/o low back pain with radiation to left leg.  Onset of pain for 1 month.  She has tried OTC tylenol and IBU.  She is up all night.  Pain is constant and sharp. No relief with position. In past she was on muscle relaxant and vicodin with relief of pain. Pt feels pain is increasing.   She is going to sports med for knee problem.  Appointment 4/16 at 8:45

## 2012-05-27 ENCOUNTER — Ambulatory Visit: Payer: Self-pay | Admitting: Radiation Oncology

## 2012-05-28 ENCOUNTER — Ambulatory Visit (INDEPENDENT_AMBULATORY_CARE_PROVIDER_SITE_OTHER): Payer: Self-pay | Admitting: Internal Medicine

## 2012-05-28 ENCOUNTER — Ambulatory Visit (HOSPITAL_COMMUNITY)
Admission: RE | Admit: 2012-05-28 | Discharge: 2012-05-28 | Disposition: A | Discharge status: Home / Self Care | Payer: Self-pay | Source: Ambulatory Visit | Attending: Internal Medicine | Admitting: Internal Medicine

## 2012-05-28 ENCOUNTER — Encounter: Payer: Self-pay | Admitting: Internal Medicine

## 2012-05-28 VITALS — BP 185/106 | HR 66 | Temp 96.7°F | Ht 65.0 in | Wt 187.9 lb

## 2012-05-28 DIAGNOSIS — M7989 Other specified soft tissue disorders: Secondary | ICD-10-CM | POA: Insufficient documentation

## 2012-05-28 DIAGNOSIS — M25569 Pain in unspecified knee: Secondary | ICD-10-CM

## 2012-05-28 DIAGNOSIS — I10 Essential (primary) hypertension: Secondary | ICD-10-CM

## 2012-05-28 DIAGNOSIS — F329 Major depressive disorder, single episode, unspecified: Secondary | ICD-10-CM

## 2012-05-28 DIAGNOSIS — M5432 Sciatica, left side: Secondary | ICD-10-CM

## 2012-05-28 DIAGNOSIS — M25562 Pain in left knee: Secondary | ICD-10-CM

## 2012-05-28 DIAGNOSIS — M5430 Sciatica, unspecified side: Secondary | ICD-10-CM

## 2012-05-28 DIAGNOSIS — M79609 Pain in unspecified limb: Secondary | ICD-10-CM

## 2012-05-28 MED ORDER — HYDROCODONE-ACETAMINOPHEN 5-325 MG PO TABS: 1.0000 | ORAL_TABLET | ORAL | Status: DC | PRN

## 2012-05-28 MED ORDER — NAPROXEN 500 MG PO TABS: 500.0000 mg | ORAL_TABLET | Freq: Two times a day (BID) | ORAL | Status: DC

## 2012-05-28 NOTE — Assessment & Plan Note (Signed)
Pertinent Data: BP Readings from Last 3 Encounters:  05/28/12 185/106  04/28/12 148/97  04/24/12 145/94    Basic Metabolic Panel:    Component Value Date/Time   NA 140 04/06/2012 1448   K 3.7 04/06/2012 1448   CL 102 04/06/2012 1448   CO2 28 04/06/2012 1448   BUN 10 04/06/2012 1448   CREATININE 0.90 04/06/2012 1448   CREATININE 1.31* 02/08/2011 1902   GLUCOSE 89 04/06/2012 1448   CALCIUM 9.6 04/06/2012 1448    Assessment: Disease Control: moderately elevated  Progress toward goals: deteriorated  Barriers to meeting goals: nonadherence to medications    Has been out of her meds x 1 week.    Patient is noncompliant some of the time with prescribed medications.   Plan:  continue current medications  Recheck next visit  Educated about medication compliance   Educational resources provided:    Self management tools provided:

## 2012-05-28 NOTE — Assessment & Plan Note (Signed)
Assessment:  Pt is currently taking fluoxetine and Seroquel as prescribed by mental health services and is controlled on this medication. Denies SI/HI.   Plan:  Continue current regimen.

## 2012-05-28 NOTE — Assessment & Plan Note (Addendum)
Assessment: Symptoms of the patient describes seem consistent with radicular pain. However, straight leg raise does not reproduce symptoms, which is not consistent with this. She does have some muscle spasm noted of the lumbar paraspinal musculature. She additionally has some tenderness to palpation over the per pharmacy muscle, perhaps some inflammation of this muscle may be contributing towards radicular symptoms. No red flag symptoms today.  Plan:      Continue with when necessary NSAIDs.   No clear indication for further imaging at this point.  May benefit from PT in the future.

## 2012-05-28 NOTE — Assessment & Plan Note (Addendum)
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 Mild to moderate disc degenerative change noted from L2-L5, worst at L3 - L4, with decreased intervertebral disc space and anterior osteophyte formation. 2 mm retrolisthesis of L3 on L4 noted. No compression deformity. Pelvic clips partly visualized. IMPRESSION: Disc degenerative change as above. No acute abnormality.   Left knee XR (04/2012) - The left knee joint spaces appear relatively well preserved. However there does appear to be a small left knee joint effusion present. Alignment is normal. IMPRESSION: No significant degenerative change. However there does appear to be a small left knee joint effusion. Original Report Authenticated By: Dwyane Dee, M.D.  Assessment: The patient per exam continues to have some mild joint effusion of the knee. As well, she may have a popliteal cyst, which is especially painful to her and limiting in relation. She did have a steroid injection of the knee during her last sports medicine visit in March 2014. Indicates that she had approximately 2-3 weeks of stabilization of her pain. However, has since returned and is very severe. She denied any indication of radicular symptoms.  Of note, her prior MRI showing meniscal tear was of her RIGHT knee, not of the currently painful knee.  Plan:      Will get ultrasound of the left lower extremity to assess for Baker cyst and if present, the size. May require drainage if large and persistently symptomatic.  Will restart NSAID - naproxen 500 mg twice a day with food. She was advised not to take any other NSAIDs during this course of therapy.  Refilled her Norco for short-term.  Has followup with sports medicine next week.

## 2012-05-28 NOTE — Progress Notes (Addendum)
Patient: Olivia Ewing   MRN: [id number]  DOB: 1950-10-07  PCP: Bronson Curb, MD   Subjective:    HPI: Ms. TRULEE HAMSTRA is a 62 y.o. female with a PMHx as outlined below, who presented to clinic today with the following chief complaints:  1) Back pain - Patient describes a 6 month history of gradually worsening - but worst over the last 1 month, aching back pain located over left lumbar area that radiates as a shooting pain down her left leg down to her knee. Currently rated 10/10 in severity. At its worst, the pain is rated 10/10 in severity. Aggravating factors include: prolonged standing. Causes difficulty with ambulation. Alleviating factors include: sitting. Patient confirms no other symptoms, sedentary life style. Denies symptoms of new numbness, new weakness, new tingling, perianal numbness, fever, dysuria, history of cancer, history of osteoporosis, history of steroid use.  2) Depression - is well controlled on current therapy. Associated symptoms include:insomnia. Denies associated suicidal ideation, homicidal ideation, feelings of worthlessness/guilt, hopelessness and tearful. Currently, the patient does follow with mental health services - has an appt next week.  3) HTN - Patient does not check blood pressure regularly at home. Currently taking Lisinopril-HCTZ 20-12.5mg  has been out x 1 week. Patient misses doses 1 x per week on average. denies headaches, dizziness, lightheadedness, chest pain, shortness of breath.  does not request refills today.   4) Left knee pain - patient has had recently increasing pain in her left knee worse over the last 2-3 months with associated mild effusion, symptoms seem to have started after lifting heavy boxes up to 50 pounds several months ago. Has had followup with sports medicine in March 2014 for evaluation of this issue, with x-ray at that time showing mild effusion. She was treated with steroid injection of the knee, which she states did  allow for 2-3 week improvement of symptoms. Since that time, she has developed rapidly worsening knee pain, similar to that prior to injection. She denies any warmth, severe swelling, joint instability, erythema of the involved area. She has not had previous surgery in this area. She has not had any resultant falls. Aggravating factors include bending, prolonged standing. Alleviating factors include only narcotics and prior steroid therapy.   Review of Systems: Per HPI.   Current Outpatient Medications: Medication Sig  . albuterol (PROVENTIL HFA) 108 (90 BASE) MCG/ACT inhaler Inhale 2 puffs into the lungs every 6 (six) hours as needed for wheezing.  Marland Kitchen FLUoxetine (PROZAC) 20 MG capsule Take 20 mg by mouth daily.    Marland Kitchen gabapentin (NEURONTIN) 400 MG capsule Take 400 mg by mouth. 1 capsule in am and 2 capsule in pm.  . lisinopril-hydrochlorothiazide (PRINZIDE,ZESTORETIC) 20-12.5 MG per tablet Take 1 tablet by mouth daily.  . pravastatin (PRAVACHOL) 40 MG tablet Take 1 tablet (40 mg total) by mouth daily.  . QUEtiapine (SEROQUEL) 50 MG tablet Take 50 mg by mouth at bedtime.    Allergies: No Known Allergies  Past Medical History  Diagnosis Date  . Hyperlipidemia   . Hypertension   . GERD (gastroesophageal reflux disease)   . Depression   . Right knee pain     posterior horn medial meniscal tear MRI 2013  . Lumbar radiculopathy, chronic   . Cervicalgia      Objective:    Physical Exam: Filed Vitals:   05/28/12 0828  BP: 185/106  Pulse: 66  Temp: 96.7 F (35.9 C)     General: Vital signs reviewed and noted.  Well-developed, well-nourished, in no acute distress; alert, appropriate and cooperative throughout examination.  Head: Normocephalic, atraumatic.  Lungs:  Normal respiratory effort. Clear to auscultation BL without crackles or wheezes.  Heart: RRR. S1 and S2 normal without gallop, rubs. No murmur.  Abdomen:  BS normoactive. Soft, Nondistended, non-tender.  No masses or  organomegaly.  Extremities: No pretibial edema. Back - Full ROM in F/E and rotation. Straight leg raise causes left low back pain but no shooting pain down her legs. Some discomfort with internal/external rotation of her knee with pain at her low back. Left knee - mild joint effusion noted. Negative anterior and posterior drawer test. No indication of lateral or medial collateral ligament impairment. Mild effusion of posterior knee, with significant TTP of popliteal fossa. DP pulses 2+ strong and equal bilaterally.   Assessment/ Plan:   The patient's case and plan of care was discussed with attending physician, Dr. Debe Coder.

## 2012-05-28 NOTE — Progress Notes (Signed)
*  PRELIMINARY RESULTS* Vascular Ultrasound Left lower extremity venous duplex has been completed.  Preliminary findings: .Left = no evidence of DVT. Baker's cyst noted.  Attempted call report to pager [phone] at 9:50am. No call back.   Farrel Demark, RDMS, RVT  05/28/2012, 10:01 AM

## 2012-05-28 NOTE — Patient Instructions (Signed)
General Instructions:  Please follow-up at the clinic in 1 month, at which time we will reevaluate blood pressure, pain - OR, please follow-up in the clinic sooner if needed.  There have been changes in your medications:  START Naproxen with food for your pain - take it twice daily  START Hydrocodone-Acetaminophen for your pain - You have been started on a new medication that can cause drowsiness, do not drive or operate heavy machinery . Do not take this medication with alcohol.    See the physical therapists  See the sports medicine doctors.   If you have been started on new medication(s), and you develop symptoms concerning for allergic reaction, including, but not limited to, throat closing, tongue swelling, rash, please stop the medication immediately and call the clinic at [phone], and go to the ER.  If symptoms worsen, or new symptoms arise, please call the clinic or go to the ER.  PLEASE BRING ALL OF YOUR MEDICATIONS  IN A BAG TO YOUR NEXT APPOINTMENT   Treatment Goals:  Goals (1 Years of Data) as of 05/28/12   None      Progress Toward Treatment Goals:  Treatment Goal 05/28/2012  Blood pressure deteriorated    Self Care Goals & Plans:  Self Care Goal 04/28/2012  Manage my medications take my medicines as prescribed; bring my medications to every visit; refill my medications on time  Monitor my health -  Eat healthy foods drink diet soda or water instead of juice or soda; eat more vegetables; eat foods that are low in salt  Be physically active -

## 2012-06-05 ENCOUNTER — Ambulatory Visit (INDEPENDENT_AMBULATORY_CARE_PROVIDER_SITE_OTHER): Payer: Self-pay | Admitting: Family Medicine

## 2012-06-05 ENCOUNTER — Encounter: Payer: Self-pay | Admitting: Family Medicine

## 2012-06-05 VITALS — BP 144/88 | HR 79 | Ht 65.0 in | Wt 187.0 lb

## 2012-06-05 DIAGNOSIS — M7120 Synovial cyst of popliteal space [Baker], unspecified knee: Secondary | ICD-10-CM

## 2012-06-05 DIAGNOSIS — M5416 Radiculopathy, lumbar region: Secondary | ICD-10-CM

## 2012-06-05 DIAGNOSIS — M19011 Primary osteoarthritis, right shoulder: Secondary | ICD-10-CM

## 2012-06-05 DIAGNOSIS — M7122 Synovial cyst of popliteal space [Baker], left knee: Secondary | ICD-10-CM

## 2012-06-05 DIAGNOSIS — IMO0002 Reserved for concepts with insufficient information to code with codable children: Secondary | ICD-10-CM

## 2012-06-05 HISTORY — DX: Primary osteoarthritis, right shoulder: M19.011

## 2012-06-05 MED ORDER — METHYLPREDNISOLONE ACETATE 80 MG/ML IJ SUSP
80.0000 mg | Freq: Once | INTRAMUSCULAR | Status: AC
  Administered 2012-06-05: 80 mg via INTRA_ARTICULAR

## 2012-06-05 NOTE — Assessment & Plan Note (Signed)
The patient states that she still has some lumbar pain at the time of discharge but did not clinically evaluate today. Patient did want a lumbar support which we did give her stating that there is a good possibility that insurance would not cover it. Patient's when she does like a month if she still having pain we can consider further evaluation.

## 2012-06-05 NOTE — Assessment & Plan Note (Signed)
Patient did have aspiration as described above. Patient did do very well. Patient is going to have increasing range of motion. Discussed icing as well as anti-inflammatories may be a good sign for the next couple days. Patient will continue the compression wrap for today and continue the compression stockings. At the end of the aspiration patient did have 1 cc of Depo-Medrol 80 mg injected into the Baker cyst. Patient will start doing the home exercises on a regular basis thank him back in one month for followup.

## 2012-06-05 NOTE — Progress Notes (Signed)
Chief complaint left knee pain  History of present illness: Patient is a 62 year old female returning for complaints of left knee pain. Patient did have an MRI approximately one year ago additionally she had a meniscal tear. Patient states that she is still having posterior knee pain. Patient did see her primary care provider who did get a large dermoid Doppler done which did not show any DVT but did show him Baker cyst. Patient states that most of her pain is now on the posterior aspect of her knee. Patient states as long as she wears compression hoses and be careful about what she lives at work she usually does fairly well. Patient states unfortunately though it is starting to wake her up at night on a more frequent basis.  Past medical history, social, surgical and family history all reviewed.   Physical exam Blood pressure 144/88, pulse 79, height 5\' 5"  (1.651 m), weight 187 lb (84.823 kg). General: No apparent distress alert and oriented x3 mood and affect normal Respiratory: Patient's speak in full sentences and does not appear short of breath Skin: Warm dry intact with no signs of infection or rash Neuro: Cranial nerves II through XII are intact, neurovascularly intact in all extremities with 2+ DTRs and 2+ pulses. Left knee exam: On inspection there is no gross deformity. Patient does have some tenderness to palpation over the posterior fossa as well as the medial joint line. Patient does have a positive Murray's. In the posterior fossa though you can feel a large Baker cyst. She is neurovascularly intact distally and all ligaments are intact.  Musculoskeletal ultrasound was performed and interpreted by me today. 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 After verbal and written consent the patient did have 3 cc of lidocaine 1% injected into the posterior fossa in the Baker cyst after being prepped with alcohol swabs. Under  ultrasound guided a 18-gauge 1-1/2 inch needle was placed into the Baker cyst and drained of approximately 45 cc of clear to mildly blood-tinged fluid. Patient tolerated the procedure well. Minimal blood loss. Patient was prepped and wrapped in a compression wrap thereafter.

## 2012-06-15 NOTE — Progress Notes (Signed)
INTERNAL MEDICINE TEACHING ATTENDING ADDENDUM - Inez Catalina, MD: I reviewed with the resident Dr. Saralyn Pilar, [name]'s  medical history, physical examination, diagnosis and results of tests and treatment and I agree with the patient's care as documented.

## 2012-06-16 ENCOUNTER — Ambulatory Visit: Payer: Self-pay

## 2012-06-22 ENCOUNTER — Ambulatory Visit: Payer: Self-pay | Admitting: Physical Therapy

## 2012-06-22 ENCOUNTER — Ambulatory Visit: Payer: Self-pay

## 2012-06-25 ENCOUNTER — Encounter: Payer: Self-pay | Admitting: Internal Medicine

## 2012-07-02 ENCOUNTER — Ambulatory Visit: Payer: No Typology Code available for payment source | Attending: Internal Medicine | Admitting: Physical Therapy

## 2012-07-02 NOTE — Addendum Note (Signed)
Addended by: Neomia Dear on: 07/02/2012 04:57 PM   Modules accepted: Orders

## 2012-07-07 ENCOUNTER — Encounter: Payer: Self-pay | Admitting: Internal Medicine

## 2012-07-08 ENCOUNTER — Ambulatory Visit (INDEPENDENT_AMBULATORY_CARE_PROVIDER_SITE_OTHER): Payer: No Typology Code available for payment source | Admitting: Sports Medicine

## 2012-07-08 ENCOUNTER — Encounter: Payer: Self-pay | Admitting: Sports Medicine

## 2012-07-08 VITALS — BP 123/85 | Ht 65.0 in | Wt 187.0 lb

## 2012-07-08 DIAGNOSIS — M25562 Pain in left knee: Secondary | ICD-10-CM

## 2012-07-08 DIAGNOSIS — M25569 Pain in unspecified knee: Secondary | ICD-10-CM

## 2012-07-08 MED ORDER — NABUMETONE 750 MG PO TABS: 750.0000 mg | ORAL_TABLET | Freq: Two times a day (BID) | ORAL | Status: DC

## 2012-07-08 NOTE — Progress Notes (Signed)
Left knee pain  Patient is a 62 year old female coming in with recurrent left knee pain. Patient was seen previously one month ago did have a very large Baker cyst that was drained under ultrasound guidance. Patient was doing very well but states now that the front part of her knee seems to be hurting more. Patient states that it seems to be catching on her as well as giving out on her from time to time. Patient states that she's having more swelling of the anterior part of the knee now. Patient states that it is not responding to anti-inflammatories and only Vicodin seems to be helping. Patient states that this is starting to affect her daily activity and is waking her up at night. Patient states that the reaccumulation of the Baker cyst has recurred but significantly less than last time. Patient has not tried any other home modalities. Patient was sent to formal physical therapy which she has not gone to get.  Past medical history, social, surgical and family history all reviewed.   Physical exam Blood pressure 123/85, height 5\' 5"  (1.651 m), weight 187 lb (84.823 kg). General: No apparent distress alert and oriented x3 mood and affect normal Respiratory: Patient's speak in full sentences and does not appear short of breath Skin: Warm dry intact with no signs of infection or rash Neuro: Cranial nerves II through XII are intact, neurovascularly intact in all extremities with 2+ DTRs and 2+ pulses. Left knee exam: Patient does have a +1 effusion of the left knee. This is non-warm. Patient does have mild reaccumulation of the Baker cyst as well. She is tender to palpation over the medial joint line with a positive McMurray's. All ligaments appear to be intact. Neurovascularly intact distally.  X-rays are reviewed and did not show any significant bony abnormalities but there is no weightbearing films. 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

## 2012-07-08 NOTE — Patient Instructions (Signed)
We will try Ralefen for the pain.   Continue the neurontin as well.  Go to Physical Therapy if you can, this will be helpful. We will get MRI Come back after MRI with myself or Dr. Jennette Kettle and we will discuss the results.

## 2012-07-10 ENCOUNTER — Ambulatory Visit: Payer: Self-pay | Admitting: Family Medicine

## 2012-07-12 ENCOUNTER — Inpatient Hospital Stay
Admission: RE | Admit: 2012-07-12 | Discharge: 2012-07-12 | Disposition: A | Discharge status: Home / Self Care | Payer: No Typology Code available for payment source | Source: Ambulatory Visit | Attending: Sports Medicine | Admitting: Sports Medicine

## 2012-07-24 ENCOUNTER — Inpatient Hospital Stay: Admission: RE | Admit: 2012-07-24 | Payer: No Typology Code available for payment source | Source: Ambulatory Visit

## 2012-07-30 ENCOUNTER — Other Ambulatory Visit: Payer: Self-pay | Admitting: Internal Medicine

## 2012-08-07 ENCOUNTER — Encounter: Payer: No Typology Code available for payment source | Admitting: Internal Medicine

## 2012-08-19 ENCOUNTER — Encounter: Payer: Self-pay | Admitting: Internal Medicine

## 2012-08-19 ENCOUNTER — Ambulatory Visit (INDEPENDENT_AMBULATORY_CARE_PROVIDER_SITE_OTHER): Payer: No Typology Code available for payment source | Admitting: Internal Medicine

## 2012-08-19 VITALS — BP 124/82 | HR 78 | Temp 98.3°F | Ht 65.0 in | Wt 186.6 lb

## 2012-08-19 DIAGNOSIS — K029 Dental caries, unspecified: Secondary | ICD-10-CM

## 2012-08-19 DIAGNOSIS — H6691 Otitis media, unspecified, right ear: Secondary | ICD-10-CM

## 2012-08-19 DIAGNOSIS — H6690 Otitis media, unspecified, unspecified ear: Secondary | ICD-10-CM

## 2012-08-19 MED ORDER — AMOXICILLIN 500 MG PO CAPS: 500.0000 mg | ORAL_CAPSULE | Freq: Two times a day (BID) | ORAL | Status: AC

## 2012-08-19 MED ORDER — KETOROLAC TROMETHAMINE 10 MG PO TABS: 10.0000 mg | ORAL_TABLET | Freq: Four times a day (QID) | ORAL | Status: DC | PRN

## 2012-08-19 MED ORDER — ALBUTEROL SULFATE HFA 108 (90 BASE) MCG/ACT IN AERS: 2.0000 | INHALATION_SPRAY | Freq: Four times a day (QID) | RESPIRATORY_TRACT | Status: DC | PRN

## 2012-08-19 MED ORDER — METRONIDAZOLE 500 MG PO TABS: 500.0000 mg | ORAL_TABLET | Freq: Three times a day (TID) | ORAL | Status: AC

## 2012-08-19 NOTE — Progress Notes (Signed)
Subjective:   [name] female   DOB: February 14, 1950 62 y.o.   MRN: [id number]  HPI: Olivia Ewing is a 62 y.o. African American female with PMH of HTN and chronic pain presenting to clinic today for an acute visit complaining of severe dental and facial pain.  She explains that for the past week she has been having increasing dental pain with radiation to the right side of her face up to the right ear and down to right side of neck.  She also feels the pain on the upper left portion of her mouth radiating to her cheeks.  Her gums are very tender to palpation and her front and bottom teeth are also tender to palpation with multiple caries as well.  She is also missing several teeth.  She denies any fever or chills and reports no improvement in pain with multiple OTC medications including advil, tylenol, 800mg  motrin, and aleve.  She also can feel a "pus like" taste in her mouth when rubbing her tongue on her teeth near the holes of her teeth.  She has been seen in the dental clinic last year and will try to be seen by them again.  She also recently signed up for [employer] insurance but is still completing paperwork and it will take at least 60 days to process her application.  Olivia Ewing does endorse being fearful of going to the dentist due to past complication and has avoided the dentist as much as possible due to that reason but knows she needs to go now.   Finally, Olivia Ewing also reports right ear pain and feeling like she can hear her heartbeat in her ear especially when lying on her right side.  She also endorses a chronic dry cough that has been getting worse lately due to living in a home full of smokers and pets to which she thinks she is allergic.  We discussed trying daily antihistamine in addition to her albuterol inhaler that she uses.  She denies smoking cigarettes.    Past Medical History  Diagnosis Date  . Hyperlipidemia   . Hypertension   .  GERD (gastroesophageal reflux disease)   . Depression   . Right knee pain     posterior horn medial meniscal tear MRI 2013  . Lumbar radiculopathy, chronic   . Cervicalgia    Current Outpatient Prescriptions  Medication Sig Dispense Refill  . albuterol (PROVENTIL HFA) 108 (90 BASE) MCG/ACT inhaler Inhale 2 puffs into the lungs every 6 (six) hours as needed for wheezing.  3 Inhaler  1  . FLUoxetine (PROZAC) 20 MG capsule Take 20 mg by mouth daily.        Marland Kitchen gabapentin (NEURONTIN) 400 MG capsule Take 400 mg by mouth. 1 capsule in am and 2 capsule in pm.      . lisinopril-hydrochlorothiazide (PRINZIDE,ZESTORETIC) 20-12.5 MG per tablet Take 1 tablet by mouth daily.  30 tablet  5  . nabumetone (RELAFEN) 750 MG tablet Take 1 tablet (750 mg total) by mouth 2 (two) times daily.  60 tablet  1  . pravastatin (PRAVACHOL) 40 MG tablet Take 1 tablet (40 mg total) by mouth daily.  30 tablet  5  . QUEtiapine (SEROQUEL) 50 MG tablet Take 50 mg by mouth at bedtime.       No current facility-administered medications for this visit.   Family History  Problem Relation Age of Onset  . Hypertension Mother    History  Social History  . Marital Status: Divorced    Spouse Name: N/A    Number of Children: N/A  . Years of Education: N/A   Social History Main Topics  . Smoking status: Former Games developer  . Smokeless tobacco: Never Used  . Alcohol Use: Yes     Comment: socially  . Drug Use: No  . Sexually Active: None   Other Topics Concern  . None   Social History Narrative   Single. 9 siblings. 1 brother with HIV, 1 sister with hypertension. 4 children, 1 daughter with HTN and depression, 1 son with HTN.   [phone]- shelter's number.    Former smoker, no alcohol or drug use.   Review of Systems:  Constitutional:  Denies fever, chills, diaphoresis, appetite change and fatigue.   HEENT:  Poor dentition, dental and facial pain, neck pain.  Denies congestion, sore throat, rhinorrhea, sneezing.     Respiratory:  Dry cough, occasional SOB when coughing.  Denies DOE, and wheezing.   Cardiovascular:  Denies palpitations and leg swelling.   Gastrointestinal:  Denies nausea, vomiting, abdominal pain, diarrhea, constipation, blood in stool and abdominal distention.   Genitourinary:  Denies dysuria, urgency, frequency, hematuria, flank pain and difficulty urinating.   Musculoskeletal:  Denies myalgias, back pain, joint swelling, arthralgias and gait problem.   Skin:  Denies pallor, rash and wound.   Neurological:  Denies dizziness, seizures, syncope, weakness, light-headedness, numbness and headaches.    Objective:  Physical Exam: Filed Vitals:   08/19/12 1453  BP: 124/82  Pulse: 78  Temp: 98.3 F (36.8 C)  TempSrc: Oral  Height: 5\' 5"  (1.651 m)  Weight: 186 lb 9.6 oz (84.641 kg)  SpO2: 100%   Vitals reviewed. General: sitting in chair, acute distress due to dental pain HEENT: PERRL, EOMI, no scleral icterus, poor dentition, missing teeth, multiple dental caries and small holes in front teeth and lower left tooth with plaque buildup as well, tenderness to palpation of front and lower teeth, b/l upper gum inflammation and tenderness to palpation.  R ear: erythema and dried up yellow drainage, tenderness to palpation of mastoid and pain ilicited when looking in right ear.  Neck: tenderness to palpation of neck but no clear lymphadenopathy palpated Cardiac: RRR, no rubs, murmurs or gallops Pulm: clear to auscultation bilaterally, no wheezes, rales, or rhonchi Abd: soft, nontender, nondistended, BS present Ext: warm and well perfused, no pedal edema, +2DP B/L Neuro: alert and oriented X3, cranial nerves II-XII grossly intact, strength and sensation to light touch equal in bilateral upper and lower extremities  Assessment & Plan:  Discussed with Dr. Dalphine Handing  R otitis media: amoxicillin x 7 days Periodontitis: flagyl x10 days, needs to call dental clinic. toradol x5 days for pain

## 2012-08-19 NOTE — Assessment & Plan Note (Signed)
Visible erythema and dried up yellow drainage.  Tenderness to right ear on physical exam.  Worsening x1 week.  -amoxicillin 500mg  bid x5 days

## 2012-08-19 NOTE — Assessment & Plan Note (Addendum)
x1 week, worsening.  Hx of poor dentition and caries.  Has not seen dentist in some time, last visit at dental clinic approximately 6 months ago last year.  Claims the pain is severe, and worsening over time with no relief with otc NSAIDs.  She has also been applying oral anesthetic to her gums and teeth for mild relief.  Visible poor dentition, caries, holes in front top and bottom teeth, gum inflammation and tenderness.  Pain while chewing and radiating up to right ear and down right side of neck with radiation to left cheek as well.  -needs to see dentist asap, dental clinic closed today, but she will call them tomorrow.  If cannot be seen in dental clinic, will try to refer again but will then likely need to go to outside dentist and try to pay out of pocket -will start flagyl 500mg  tid x10 days to treat for suspected infection -toradol 10mg  q6h prn x5 days and then NSAIDs for anti-inflammatory relief -continue local topical anesthetic to affected area as tolerated -recommend soft or liquid diet in meantime -instructed to call dentist office or clinic if pain worsens, notices infection, fever, chills, worsening swelling shortness of breath or chest pain and other concerning symptoms

## 2012-08-19 NOTE — Patient Instructions (Addendum)
General Instructions:  Please start taking your antibiotics: amoxicillin for right ear infection for 7 days and flagyl for tooth infection for total 10 days as prescribed  We have also prescribed limited supply of toradol for pain, you can take up to 10mg  up to 6 hours a day as needed but only for 5 days; otherwise anti-inflammatory medication such as ibuprofen is probably best and can be found over the counter  Please contact the dental clinic ASAP and schedule an appointment [phone], you must keep your appointment with them as they will not reschedule or please go see a dentist on your own as soon as possible  If your pain gets worse or if you notice swelling, shortness of breath, chest pain, fever, chills, trouble swallowing, call the clinic right away or go to the ED  Treatment Goals:  Goals (1 Years of Data) as of 08/19/12   None      Progress Toward Treatment Goals:  Treatment Goal 08/19/2012  Blood pressure at goal    Self Care Goals & Plans:  Self Care Goal 08/19/2012  Manage my medications bring my medications to every visit; take my medicines as prescribed  Monitor my health -  Eat healthy foods -  Be physically active -    Care Management & Community Referrals:     Gingivitis  Gingivitis is an infection of the teeth and bones that support the teeth. Your gums become red, sore, and puffy (swollen). It is caused by germs that build up on your teeth and gums (plaque). HOME CARE  Floss and then brush your teeth.  Brush at least twice a day.  Floss at least once a day.  Avoid sugar between meals.  Do not drink juice before bed. Only drink water.  Make and keep your regular checkups and cleanings with your dentist.  Use any mouth care product or toothpaste as told by your dentist. GET HELP RIGHT AWAY IF:  You have painful, red tissue around your teeth.  You have trouble chewing.  You have loose or infected teeth. MAKE SURE YOU:  Understand these  instructions.  Will watch your condition.  Will get help right away if you are not doing well or get worse. Document Released: 03/02/2010 Document Revised: 04/22/2011 Document Reviewed: 03/02/2010 [facility] Patient Information 2014 [employer], [place].  Otitis Media, Adult A middle ear infection is an infection in the space behind the eardrum. It often happens along with a cold. It is caused by a germ that starts growing in that space. Your neck may feel puffy (swollen) on the side of the ear infection. HOME CARE  Take your medicine as told. Finish it even if you start to feel better.  Nose medicine (nasal decongestant) may help the tube that connects the ear and throat (eustachian tube) drain better. It may also help with discomfort.  Follow up with your doctor in 10 to 14 days or as told by your doctor. This is to make sure the infection is gone. GET HELP RIGHT AWAY IF:   You do not start to feel better in 2 to 3 days.  You have pain that is not helped with medicine.  You cannot use the medicine as told.  You feel worse instead of better.  You develop puffiness, redness, or pain around the ear.  You get a stiff neck. MAKE SURE YOU:   Understand these instructions.  Will watch your condition.  Will get help right away if you are not doing well or  get worse. Document Released: 07/17/2007 Document Revised: 04/22/2011 Document Reviewed: 07/17/2007 [facility] Patient Information 2014 [place], [place].  Amoxicillin capsules or tablets What is this medicine? AMOXICILLIN (a mox i SIL in) is a penicillin antibiotic. It is used to treat certain kinds of bacterial infections. It will not work for colds, flu, or other viral infections. This medicine may be used for other purposes; ask your health care provider or pharmacist if you have questions. What should I tell my health care provider before I take this medicine? They need to know if you have any of these conditions: -asthma -kidney  disease -an unusual or allergic reaction to amoxicillin, other penicillins, cephalosporin antibiotics, other medicines, foods, dyes, or preservatives -pregnant or trying to get pregnant -breast-feeding How should I use this medicine? Take this medicine by mouth with a glass of water. Follow the directions on your prescription label. You may take this medicine with food or on an empty stomach. Take your medicine at regular intervals. Do not take your medicine more often than directed. Take all of your medicine as directed even if you think your are better. Do not skip doses or stop your medicine early. Talk to your pediatrician regarding the use of this medicine in children. While this drug may be prescribed for selected conditions, precautions do apply. Overdosage: If you think you have taken too much of this medicine contact a poison control center or emergency room at once. NOTE: This medicine is only for you. Do not share this medicine with others. What if I miss a dose? If you miss a dose, take it as soon as you can. If it is almost time for your next dose, take only that dose. Do not take double or extra doses. What may interact with this medicine? -amiloride -birth control pills -chloramphenicol -macrolides -probenecid -sulfonamides -tetracyclines This list may not describe all possible interactions. Give your health care provider a list of all the medicines, herbs, non-prescription drugs, or dietary supplements you use. Also tell them if you smoke, drink alcohol, or use illegal drugs. Some items may interact with your medicine. What should I watch for while using this medicine? Tell your doctor or health care professional if your symptoms do not improve in 2 or 3 days. Take all of the doses of your medicine as directed. Do not skip doses or stop your medicine early. If you are diabetic, you may get a false positive result for sugar in your urine with certain brands of urine tests. Check  with your doctor. Do not treat diarrhea with over-the-counter products. Contact your doctor if you have diarrhea that lasts more than 2 days or if the diarrhea is severe and watery. What side effects may I notice from receiving this medicine? Side effects that you should report to your doctor or health care professional as soon as possible: -allergic reactions like skin rash, itching or hives, swelling of the face, lips, or tongue -breathing problems -dark urine -redness, blistering, peeling or loosening of the skin, including inside the mouth -seizures -severe or watery diarrhea -trouble passing urine or change in the amount of urine -unusual bleeding or bruising -unusually weak or tired -yellowing of the eyes or skin Side effects that usually do not require medical attention (report to your doctor or health care professional if they continue or are bothersome): -dizziness -headache -stomach upset -trouble sleeping This list may not describe all possible side effects. Call your doctor for medical advice about side effects. You may report side effects  to FDA at 1-800-FDA-1088. Where should I keep my medicine? Keep out of the reach of children. Store between 68 and 77 degrees F (20 and 25 degrees C). Keep bottle closed tightly. Throw away any unused medicine after the expiration date. NOTE: This sheet is a summary. It may not cover all possible information. If you have questions about this medicine, talk to your doctor, pharmacist, or health care provider.  2012, [employer]vier/[employer] Standard. (04/21/2007 2:10:59 PM)  Metronidazole tablets or capsules What is this medicine? METRONIDAZOLE (me troe NI da zole) is an antiinfective. It is used to treat certain kinds of bacterial and protozoal infections. It will not work for colds, flu, or other viral infections. This medicine may be used for other purposes; ask your health care provider or pharmacist if you have questions. What should I tell my  health care provider before I take this medicine? They need to know if you have any of these conditions: -anemia or other blood disorders -disease of the nervous system -fungal or yeast infection -if you drink alcohol containing drinks -liver disease -seizures -an unusual or allergic reaction to metronidazole, or other medicines, foods, dyes, or preservatives -pregnant or trying to get pregnant -breast-feeding How should I use this medicine? Take this medicine by mouth with a full glass of water. Follow the directions on the prescription label. Take your medicine at regular intervals. Do not take your medicine more often than directed. Take all of your medicine as directed even if you think you are better. Do not skip doses or stop your medicine early. Talk to your pediatrician regarding the use of this medicine in children. Special care may be needed. Overdosage: If you think you have taken too much of this medicine contact a poison control center or emergency room at once. NOTE: This medicine is only for you. Do not share this medicine with others. What if I miss a dose? If you miss a dose, take it as soon as you can. If it is almost time for your next dose, take only that dose. Do not take double or extra doses. What may interact with this medicine? Do not take this medicine with any of the following medications: -alcohol or any product that contains alcohol -amprenavir oral solution -disulfiram -paclitaxel injection -ritonavir oral solution -sertraline oral solution -sulfamethoxazole-trimethoprim injection This medicine may also interact with the following medications: -cimetidine -lithium -phenobarbital -phenytoin -warfarin This list may not describe all possible interactions. Give your health care provider a list of all the medicines, herbs, non-prescription drugs, or dietary supplements you use. Also tell them if you smoke, drink alcohol, or use illegal drugs. Some items may  interact with your medicine. What should I watch for while using this medicine? Tell your doctor or health care professional if your symptoms do not improve or if they get worse. You may get drowsy or dizzy. Do not drive, use machinery, or do anything that needs mental alertness until you know how this medicine affects you. Do not stand or sit up quickly, especially if you are an older patient. This reduces the risk of dizzy or fainting spells. Avoid alcoholic drinks while you are taking this medicine and for three days afterward. Alcohol may make you feel dizzy, sick, or flushed. If you are being treated for a sexually transmitted disease, avoid sexual contact until you have finished your treatment. Your sexual partner may also need treatment. What side effects may I notice from receiving this medicine? Side effects that you should report  to your doctor or health care professional as soon as possible: -allergic reactions like skin rash or hives, swelling of the face, lips, or tongue -confusion, clumsiness -difficulty speaking -discolored or sore mouth -dizziness -fever, infection -numbness, tingling, pain or weakness in the hands or feet -trouble passing urine or change in the amount of urine -redness, blistering, peeling or loosening of the skin, including inside the mouth -seizures -unusually weak or tired -vaginal irritation, dryness, or discharge Side effects that usually do not require medical attention (report to your doctor or health care professional if they continue or are bothersome): -diarrhea -headache -irritability -metallic taste -nausea -stomach pain or cramps -trouble sleeping This list may not describe all possible side effects. Call your doctor for medical advice about side effects. You may report side effects to FDA at 1-800-FDA-1088. Where should I keep my medicine? Keep out of the reach of children. Store at room temperature below 25 degrees C (77 degrees F).  Protect from light. Keep container tightly closed. Throw away any unused medicine after the expiration date. NOTE: This sheet is a summary. It may not cover all possible information. If you have questions about this medicine, talk to your doctor, pharmacist, or health care provider.  2013, Elsevier/Gold Standard. (11/16/2007 10:37:11 AM)  Ketorolac tablets What is this medicine? KETOROLAC (kee toe ROLE ak) is a non-steroidal anti-inflammatory drug (NSAID). It is used for a short while to treat moderate to severe pain, including pain after surgery. It should not be used for more than 5 days. This medicine may be used for other purposes; ask your health care provider or pharmacist if you have questions. What should I tell my health care provider before I take this medicine? They need to know if you have any of these conditions: -asthma -bleeding problems like hemophilia -cigarette smoker -drink more than 3 alcohol containing drinks a day -heart disease or circulation problems such as heart failure or leg edema (fluid retention) -high blood pressure -kidney disease -liver disease -stomach bleeding or ulcers -an unusual or allergic reaction to ketorolac, aspirin, other NSAIDs, other medicines, foods, dyes, or preservatives -pregnant or trying to get pregnant -breast-feeding How should I use this medicine? Take this medicine by mouth with a full glass of water. Follow the directions on the prescription label. Take your medicine at regular intervals. Do not take your medicine more often than directed. Do not take more than the recommended dose. A special MedGuide will be given to you by the pharmacist with each prescription and refill. Be sure to read this information carefully each time. Talk to your pediatrician regarding the use of this medicine in children. While this drug may be prescribed for children as young as 33 years of age for selected conditions, precautions do apply. Patients over  1 years old may have a stronger reaction and need a smaller dose. Overdosage: If you think you have taken too much of this medicine contact a poison control center or emergency room at once. NOTE: This medicine is only for you. Do not share this medicine with others. What if I miss a dose? If you miss a dose, take it as soon as you can. If it is almost time for your next dose, take only that dose. Do not take double or extra doses. What may interact with this medicine? Do not take this medicine with any of the following medications: -aspirin and aspirin-like medicines -cidofovir -methotrexate -NSAIDs, medicines for pain and inflammation, like ibuprofen or naproxen -pemetrexed -probenecid This  medicine may also interact with the following medications: -alcohol -alendronate -alprazolam -carbamazepine -cyclosporine -diuretics -flavocoxid -fluoxetine -ginkgo -lithium -medicines for high blood pressure like enalapril -medicines that affect platelets like pentoxifylline -medicines that treat or prevent blood clots like heparin, warfarin -muscle relaxants -phenytoin -steroid medicines like prednisone or cortisone -thiothixene This list may not describe all possible interactions. Give your health care provider a list of all the medicines, herbs, non-prescription drugs, or dietary supplements you use. Also tell them if you smoke, drink alcohol, or use illegal drugs. Some items may interact with your medicine. What should I watch for while using this medicine? Tell your doctor or health care professional if your pain does not get better. Talk to your doctor before taking another medicine for pain. Do not treat yourself. This medicine does not prevent heart attack or stroke. In fact, this medicine may increase the chance of a heart attack or stroke. The chance may increase with longer use of this medicine and in people who have heart disease. If you take aspirin to prevent heart attack or  stroke, talk with your doctor or health care professional. Do not take medicines such as ibuprofen and naproxen with this medicine. Side effects such as stomach upset, nausea, or ulcers may be more likely to occur. Many medicines available without a prescription should not be taken with this medicine. This medicine can cause ulcers and bleeding in the stomach and intestines at any time during treatment. Do not smoke cigarettes or drink alcohol. These increase irritation to your stomach and can make it more susceptible to damage from this medicine. Ulcers and bleeding can happen without warning symptoms and can cause death. You may get drowsy or dizzy. Do not drive, use machinery, or do anything that needs mental alertness until you know how this medicine affects you. Do not stand or sit up quickly, especially if you are an older patient. This reduces the risk of dizzy or fainting spells. This medicine can cause you to bleed more easily. Try to avoid damage to your teeth and gums when you brush or floss your teeth. What side effects may I notice from receiving this medicine? Side effects that you should report to your doctor or health care professional as soon as possible: -allergic reactions like skin rash, itching or hives, swelling of the face, lips, or tongue -black or tarry stools -breathing problems -changes in vision -chest pain -high blood pressure -nausea or vomiting -redness, blistering, peeling or loosening of the skin, including inside the mouth -severe abdominal pain -slurred speech or weakness on one side of the body -unexplained weight gain or swelling -unusual bleeding or bruising -unusually weak or tired -yellowing of eyes or skin Side effects that usually do not require medical attention (report to your doctor or health care professional if they continue or are bothersome): -diarrhea -dizziness -headache -heartburn This list may not describe all possible side effects. Call  your doctor for medical advice about side effects. You may report side effects to FDA at 1-800-FDA-1088. Where should I keep my medicine? Keep out of the reach of children. Store at room temperature between 20 and 25 degrees C (68 and 77 degrees F). Throw away any unused medicine after the expiration date. NOTE: This sheet is a summary. It may not cover all possible information. If you have questions about this medicine, talk to your doctor, pharmacist, or health care provider.  2013, Elsevier/Gold Standard. (06/18/2007 5:23:47 PM)

## 2012-08-20 NOTE — Progress Notes (Signed)
Case discussed with Dr. Qureshi soon after the resident saw the patient.  We reviewed the resident's history and exam and pertinent patient test results.  I agree with the assessment, diagnosis, and plan of care documented in the resident's note. 

## 2012-08-24 ENCOUNTER — Encounter: Payer: Self-pay | Admitting: *Deleted

## 2012-08-24 DIAGNOSIS — M25562 Pain in left knee: Secondary | ICD-10-CM

## 2012-08-30 ENCOUNTER — Ambulatory Visit
Admission: RE | Admit: 2012-08-30 | Discharge: 2012-08-30 | Disposition: A | Discharge status: Home / Self Care | Payer: No Typology Code available for payment source | Source: Ambulatory Visit | Attending: Sports Medicine | Admitting: Sports Medicine

## 2012-08-30 DIAGNOSIS — M25562 Pain in left knee: Secondary | ICD-10-CM

## 2012-09-01 ENCOUNTER — Telehealth: Payer: Self-pay | Admitting: *Deleted

## 2012-09-01 NOTE — Telephone Encounter (Signed)
Olivia Ewing like she saw Ian Malkin and had MRI--MRI shows meniscal tear--that is likely causing her problems--I would rec she see ortho---I am not sure what they will recommend: maybe a scope, maybe something more.  Please tell her I apologize for the delay---it must have gone to Dr smith's inbox and sat there--I never got  A copy of report THANKS! Denny Levy

## 2012-09-01 NOTE — Telephone Encounter (Signed)
Pt advised of MRI results.  She states her knee is very painful, hard to sleep.  Requesting something for pain to be called in to [facility] outpatient pharmacy.  Advised her I will send referral to [facility] ortho, and they will call her to set up appt.

## 2012-09-01 NOTE — Telephone Encounter (Signed)
Pt would like her left knee mri results

## 2012-09-02 ENCOUNTER — Telehealth: Payer: Self-pay | Admitting: *Deleted

## 2012-09-02 MED ORDER — TRAMADOL HCL 50 MG PO TABS: 50.0000 mg | ORAL_TABLET | Freq: Two times a day (BID) | ORAL | Status: DC | PRN

## 2012-09-02 MED ORDER — HYDROCODONE-ACETAMINOPHEN 5-325 MG PO TABS: 2.0000 | ORAL_TABLET | Freq: Two times a day (BID) | ORAL | Status: DC | PRN

## 2012-09-02 NOTE — Telephone Encounter (Signed)
Pt states she cannot take tramadol as it gives her nightmares, headaches, and cold sweats.  Per Dr. Jennette Kettle called in vicodin 5/325 take 2 by mouth every 12 hours as needed #60, No refills. Pt notified of refill.

## 2012-09-02 NOTE — Telephone Encounter (Signed)
Spoke with pt- advised that I scheduled her with Dr. Elliot Dally [facility] for her L knee meniscal tear on 09/28/12 at 10 am.  Advised her that she must speak with the financial counselor at their office at [phone] if she is unable to bring $150 to her first appt.  Pt notified of appt info.

## 2012-09-02 NOTE — Telephone Encounter (Signed)
Pt notified rx was sent

## 2012-09-02 NOTE — Telephone Encounter (Signed)
Message copied by Jacki Cones C on Wed Sep 02, 2012  8:41 AM ------      Message from: CERESI, MELANIE L      Created: Wed Sep 02, 2012  8:25 AM      Regarding: phone message       Please send script next door instead of [facility].  Also, she called back to let you know she would rather be referred to the orthopedic at [facility]. Thanks! ------

## 2012-10-06 ENCOUNTER — Ambulatory Visit: Payer: No Typology Code available for payment source | Admitting: Internal Medicine

## 2012-10-06 ENCOUNTER — Encounter: Payer: Self-pay | Admitting: Internal Medicine

## 2012-11-07 ENCOUNTER — Emergency Department (INDEPENDENT_AMBULATORY_CARE_PROVIDER_SITE_OTHER)
Admission: EM | Admit: 2012-11-07 | Discharge: 2012-11-07 | Disposition: A | Discharge status: Home / Self Care | Payer: No Typology Code available for payment source | Source: Home / Self Care | Attending: Emergency Medicine | Admitting: Emergency Medicine

## 2012-11-07 ENCOUNTER — Encounter (HOSPITAL_COMMUNITY): Payer: Self-pay

## 2012-11-07 DIAGNOSIS — K089 Disorder of teeth and supporting structures, unspecified: Secondary | ICD-10-CM

## 2012-11-07 DIAGNOSIS — K029 Dental caries, unspecified: Secondary | ICD-10-CM

## 2012-11-07 DIAGNOSIS — K0889 Other specified disorders of teeth and supporting structures: Secondary | ICD-10-CM

## 2012-11-07 MED ORDER — HYDROCODONE-ACETAMINOPHEN 5-325 MG PO TABS: 1.0000 | ORAL_TABLET | Freq: Four times a day (QID) | ORAL | Status: DC | PRN

## 2012-11-07 MED ORDER — PENICILLIN V POTASSIUM 500 MG PO TABS: 500.0000 mg | ORAL_TABLET | Freq: Three times a day (TID) | ORAL | Status: DC

## 2012-11-07 NOTE — ED Notes (Signed)
C/o pani and swelling in her mouth past 3 days

## 2012-11-07 NOTE — ED Provider Notes (Signed)
CSN: [id number]     Arrival date & time 11/07/12  1217 History   First MD Initiated Contact with Patient 11/07/12 1321     Chief Complaint  Patient presents with  . Oral Swelling   (Consider location/radiation/quality/duration/timing/severity/associated sxs/prior Treatment) HPI Comments: 62 year old female presents complaining of severe pain in her right upper jaw that radiates up into her nose, worsening for the past 3 days. She states she knows she has bad teeth in the tooth affected by this as a "hole in the back of it." She has arranged a dental appointment for next week but she states the pain is too severe and she needed to come in to be seen. She has subjective swelling in this area as well. This is also giving her a headache, diffuse across her forehead. The pain is throbbing in nature and has not been responding to oral ibuprofen. This is making her nose run, clear rhinorrhea. She denies fever, chills, blurry vision, NVD.   Past Medical History  Diagnosis Date  . Hyperlipidemia   . Hypertension   . GERD (gastroesophageal reflux disease)   . Depression   . Right knee pain     posterior horn medial meniscal tear MRI 2013  . Lumbar radiculopathy, chronic   . Cervicalgia    Past Surgical History  Procedure Laterality Date  . Hysterectomy other    . Bladder tack  10/2006    cystocoele  . Appendectomy     Family History  Problem Relation Age of Onset  . Hypertension Mother    History  Substance Use Topics  . Smoking status: Former Games developer  . Smokeless tobacco: Never Used  . Alcohol Use: Yes     Comment: socially   OB History   Grav Para Term Preterm Abortions TAB SAB Ect Mult Living                 Review of Systems  Constitutional: Negative for fever and chills.  HENT: Positive for rhinorrhea and dental problem.   Eyes: Negative for visual disturbance.  Respiratory: Negative for cough and shortness of breath.   Cardiovascular: Negative for chest pain, palpitations  and leg swelling.  Gastrointestinal: Negative for nausea, vomiting and abdominal pain.  Endocrine: Negative for polydipsia and polyuria.  Genitourinary: Negative for dysuria, urgency and frequency.  Musculoskeletal: Negative for myalgias and arthralgias.  Skin: Negative for rash.  Neurological: Positive for headaches. Negative for dizziness, weakness and light-headedness.    Allergies  Review of patient's allergies indicates no known allergies.  Home Medications   Current Outpatient Rx  Name  Route  Sig  Dispense  Refill  . FLUoxetine (PROZAC) 20 MG capsule   Oral   Take 20 mg by mouth daily.           Marland Kitchen gabapentin (NEURONTIN) 400 MG capsule   Oral   Take 400 mg by mouth. 1 capsule in am and 2 capsule in pm.         . lisinopril-hydrochlorothiazide (PRINZIDE,ZESTORETIC) 20-12.5 MG per tablet   Oral   Take 1 tablet by mouth daily.   30 tablet   5   . pravastatin (PRAVACHOL) 40 MG tablet   Oral   Take 1 tablet (40 mg total) by mouth daily.   30 tablet   5   . QUEtiapine (SEROQUEL) 50 MG tablet   Oral   Take 50 mg by mouth at bedtime.         Marland Kitchen albuterol (PROVENTIL HFA) 108 (90  BASE) MCG/ACT inhaler   Inhalation   Inhale 2 puffs into the lungs every 6 (six) hours as needed for wheezing.   3 Inhaler   1     3 month supply   . HYDROcodone-acetaminophen (NORCO) 5-325 MG per tablet   Oral   Take 1 tablet by mouth every 6 (six) hours as needed for pain.   10 tablet   0   . HYDROcodone-acetaminophen (NORCO/VICODIN) 5-325 MG per tablet   Oral   Take 2 tablets by mouth every 12 (twelve) hours as needed for pain.   60 tablet   0   . ketorolac (TORADOL) 10 MG tablet   Oral   Take 1 tablet (10 mg total) by mouth every 6 (six) hours as needed for pain (max 40mg  per day).   20 tablet   0   . nabumetone (RELAFEN) 750 MG tablet   Oral   Take 1 tablet (750 mg total) by mouth 2 (two) times daily.   60 tablet   1   . penicillin v potassium (VEETID) 500 MG  tablet   Oral   Take 1 tablet (500 mg total) by mouth 3 (three) times daily.   30 tablet   0   . traMADol (ULTRAM) 50 MG tablet   Oral   Take 1 tablet (50 mg total) by mouth 2 (two) times daily as needed for pain.   60 tablet   0    BP 144/92  Pulse 76  Temp(Src) 98.9 F (37.2 C) (Oral)  Resp 18  SpO2 100% Physical Exam  Nursing note and vitals reviewed. Constitutional: She is oriented to person, place, and time. Vital signs are normal. She appears well-developed and well-nourished. She appears distressed.  HENT:  Head: Normocephalic and atraumatic.  Mouth/Throat: Dental caries (pain, TTP of cheek. The hole, or cavity, is visible in the tooth she has described) present. Dental abscesses: no redness or definite abscess.  Eyes: EOM are normal. Pupils are equal, round, and reactive to light.  Pulmonary/Chest: Effort normal. No respiratory distress.  Neurological: She is alert and oriented to person, place, and time. She has normal strength. Coordination normal.  Skin: Skin is warm and dry. No rash noted. She is not diaphoretic.  Psychiatric: She has a normal mood and affect. Her behavior is normal. Judgment normal.    ED Course  Dental Date/Time: 11/07/2012 2:42 PM Performed by: Autumn Messing, H Authorized by: Autumn Messing, H Consent: Verbal consent obtained. Risks and benefits: risks, benefits and alternatives were discussed Consent given by: patient Local anesthesia used: yes Anesthesia: nerve block Local anesthetic: bupivacaine 0.5% without epinephrine Anesthetic total: 1.8 ml Patient sedated: no Patient tolerance: Patient tolerated the procedure well with no immediate complications.   (including critical care time) Labs Review Labs Reviewed - No data to display Imaging Review No results found.  MDM   1. Toothache   2. Dental caries    Dental nerve block placed with resolution of her symptoms within 5 minutes. We'll treat her with penicillin and tramadol as  needed. She will followup with her dentist    Meds ordered this encounter  Medications  . penicillin v potassium (VEETID) 500 MG tablet    Sig: Take 1 tablet (500 mg total) by mouth 3 (three) times daily.    Dispense:  30 tablet    Refill:  0    Order Specific Question:  Supervising Provider    Answer:  Lorenz Coaster, DAVID C [id number]  . HYDROcodone-acetaminophen (NORCO) 5-325  MG per tablet    Sig: Take 1 tablet by mouth every 6 (six) hours as needed for pain.    Dispense:  10 tablet    Refill:  0    Order Specific Question:  Supervising Provider    Answer:  Lorenz Coaster, DAVID C [[id number]]      Graylon Good, PA-C 11/08/12 2056

## 2012-11-09 NOTE — ED Provider Notes (Signed)
Medical screening examination/treatment/procedure(s) were performed by non-physician practitioner and as supervising physician I was immediately available for consultation/collaboration.  Leslee Home, M.D.  Reuben Likes, MD 11/09/12 1321

## 2012-11-17 DIAGNOSIS — M1990 Unspecified osteoarthritis, unspecified site: Secondary | ICD-10-CM | POA: Insufficient documentation

## 2012-11-17 HISTORY — DX: Unspecified osteoarthritis, unspecified site: M19.90

## 2012-11-24 ENCOUNTER — Ambulatory Visit: Payer: No Typology Code available for payment source | Admitting: Internal Medicine

## 2012-12-02 ENCOUNTER — Ambulatory Visit: Payer: No Typology Code available for payment source | Admitting: Internal Medicine

## 2012-12-02 ENCOUNTER — Encounter: Payer: Self-pay | Admitting: Internal Medicine

## 2012-12-14 ENCOUNTER — Emergency Department (INDEPENDENT_AMBULATORY_CARE_PROVIDER_SITE_OTHER)
Admission: EM | Admit: 2012-12-14 | Discharge: 2012-12-14 | Disposition: A | Discharge status: Home / Self Care | Payer: No Typology Code available for payment source | Source: Home / Self Care | Attending: Emergency Medicine | Admitting: Emergency Medicine

## 2012-12-14 ENCOUNTER — Encounter (HOSPITAL_COMMUNITY): Payer: Self-pay | Admitting: Emergency Medicine

## 2012-12-14 DIAGNOSIS — K0401 Reversible pulpitis: Secondary | ICD-10-CM

## 2012-12-14 MED ORDER — MELOXICAM 15 MG PO TABS: 15.0000 mg | ORAL_TABLET | Freq: Every day | ORAL | Status: DC

## 2012-12-14 MED ORDER — HYDROCODONE-ACETAMINOPHEN 5-325 MG PO TABS
ORAL_TABLET | ORAL | Status: AC
  Filled 2012-12-14: qty 2

## 2012-12-14 MED ORDER — HYDROCODONE-ACETAMINOPHEN 7.5-750 MG PO TABS: 1.0000 | ORAL_TABLET | Freq: Four times a day (QID) | ORAL | Status: DC | PRN

## 2012-12-14 MED ORDER — METRONIDAZOLE 500 MG PO TABS: 500.0000 mg | ORAL_TABLET | Freq: Three times a day (TID) | ORAL | Status: DC

## 2012-12-14 MED ORDER — AMOXICILLIN 500 MG PO CAPS: 500.0000 mg | ORAL_CAPSULE | Freq: Three times a day (TID) | ORAL | Status: DC

## 2012-12-14 MED ORDER — HYDROCODONE-ACETAMINOPHEN 5-325 MG PO TABS
2.0000 | ORAL_TABLET | Freq: Once | ORAL | Status: AC
  Administered 2012-12-14: 2 via ORAL

## 2012-12-14 NOTE — ED Notes (Signed)
Dental pain left side of mouth. Onset this past weekend. Two bad teeth up top and one on the bottom. Not able to sleep due to pain. otc meds not working.

## 2012-12-14 NOTE — ED Provider Notes (Signed)
Chief Complaint:   Chief Complaint  Patient presents with  . Dental Pain    History of Present Illness:   Olivia Ewing is a 62 year old female who presents with a three-day history of pain in multiple upper and lower teeth on the left side. There is swelling of the gingiva and the cheek. She continues to find taste in her mouth. She's felt chilled but not had any fever. Her ears ache. She denies any headache or eye pain. No neck pain or swelling. It hurts to chew on that side. She denies any difficulty swallowing or breathing. She is able to open her mouth fully. She was here September 27 for the same thing. She has not been able to get in to see a dentist yet.  Review of Systems:  Other than noted above, the patient denies any of the following symptoms: Systemic:  No fever, chills,  Or sweats. ENT:  No headache, ear ache, sore throat, nasal congestion, facial pain, or swelling. Lymphatic:  No adenopathy. Lungs:  No coughing, wheezing or shortness of breath.  PMFSH:  Past medical history, family history, social history, meds, and allergies were reviewed. She has high blood pressure and takes lisinopril, hydrochlorothiazide, Neurontin, Prozac, and Seroquel. She also has depression.  Physical Exam:   Vital signs:  BP 140/93  Pulse 74  Temp(Src) 98.6 F (37 C) (Oral)  Resp 18  SpO2 100% General:  Alert, oriented, in no distress. ENT:  TMs and canals normal.  Nasal mucosa normal. Mouth exam:  She has multiple carious teeth. There were none that look worse in any other. There was no purulent drainage or collection of pus. No swelling of the gingiva. The pharynx was clear and the airway was widely patent. No swelling of the tongue floor the mouth. She's able to open her mouth fully. Neck:  No swelling or adenopathy. Lungs:  Breath sounds clear and equal bilaterally.  No wheezes, rales or rhonchi. Heart:  Regular rhythm.  No gallops or murmers. Skin:  Clear, warm and dry.   Course in  Urgent Care Center:   Given Norco 5/325 2 for pain.  Assessment:  The encounter diagnosis was Pulpitis.  She needs to see a dentist as soon as possible.  Plan:   1.  Meds:  The following meds were prescribed:   Discharge Medication List as of 12/14/2012  3:14 PM    START taking these medications   Details  amoxicillin (AMOXIL) 500 MG capsule Take 1 capsule (500 mg total) by mouth 3 (three) times daily., Starting 12/14/2012, Until Discontinued, Normal    HYDROcodone-acetaminophen (VICODIN ES) 7.5-750 MG per tablet Take 1 tablet by mouth every 6 (six) hours as needed for pain., Starting 12/14/2012, Until Discontinued, Print    meloxicam (MOBIC) 15 MG tablet Take 1 tablet (15 mg total) by mouth daily., Starting 12/14/2012, Until Discontinued, Normal    metroNIDAZOLE (FLAGYL) 500 MG tablet Take 1 tablet (500 mg total) by mouth 3 (three) times daily., Starting 12/14/2012, Until Discontinued, Normal        2.  Patient Education/Counseling:  The patient was given appropriate handouts, self care instructions, and instructed in symptomatic relief. Suggested sleeping with head of bed elevated and hot salt water mouthwash.   3.  Follow up:  The patient was told to follow up if no better in 3 to 4 days, if becoming worse in any way, and given some red flag symptoms such as difficulty swallowing or breathing which would prompt immediate return.  Follow up with a dentist as soon as posssible.     Reuben Likes, MD 12/14/12 [phone]

## 2012-12-14 NOTE — ED Notes (Signed)
Outpatient pharmacy called .  7.5/725 no longer available.  Dr Lorenz Coaster made substitution for 7.5/325 as suggested by outpatient pharmacy

## 2013-01-25 ENCOUNTER — Ambulatory Visit: Payer: Self-pay

## 2013-02-24 ENCOUNTER — Emergency Department (INDEPENDENT_AMBULATORY_CARE_PROVIDER_SITE_OTHER)
Admission: EM | Admit: 2013-02-24 | Discharge: 2013-02-24 | Disposition: A | Discharge status: Home / Self Care | Payer: Self-pay | Source: Home / Self Care | Attending: Family Medicine | Admitting: Family Medicine

## 2013-02-24 ENCOUNTER — Encounter (HOSPITAL_COMMUNITY): Payer: Self-pay | Admitting: Emergency Medicine

## 2013-02-24 DIAGNOSIS — G8929 Other chronic pain: Secondary | ICD-10-CM

## 2013-02-24 DIAGNOSIS — K089 Disorder of teeth and supporting structures, unspecified: Secondary | ICD-10-CM

## 2013-02-24 MED ORDER — AMOXICILLIN 500 MG PO CAPS: 1000.0000 mg | ORAL_CAPSULE | Freq: Two times a day (BID) | ORAL | Status: DC

## 2013-02-24 MED ORDER — HYDROCODONE-ACETAMINOPHEN 5-325 MG PO TABS: 1.0000 | ORAL_TABLET | ORAL | Status: DC | PRN

## 2013-02-24 NOTE — ED Notes (Addendum)
C/o left side dental pain States she has two bad top teeth States she has swelling  States pain is shooting upward to her nose and eye area  States she is having right middle finger pain States finger will get stuck when she closes hand

## 2013-02-24 NOTE — ED Provider Notes (Signed)
CSN: [id number]     Arrival date & time 02/24/13  1145 History   None    Chief Complaint  Patient presents with  . Dental Pain  . Hand Pain   (Consider location/radiation/quality/duration/timing/severity/associated sxs/prior Treatment) HPI Comments: Complaints of acute on chronic dental pain. Has been to the ER as well as the urgent care several times for dental pain. She states now the pain is radiating into her face and nose.   Past Medical History  Diagnosis Date  . Hyperlipidemia   . Hypertension   . GERD (gastroesophageal reflux disease)   . Depression   . Right knee pain     posterior horn medial meniscal tear MRI 2013  . Lumbar radiculopathy, chronic   . Cervicalgia    Past Surgical History  Procedure Laterality Date  . Hysterectomy other    . Bladder tack  10/2006    cystocoele  . Appendectomy     Family History  Problem Relation Age of Onset  . Hypertension Mother    History  Substance Use Topics  . Smoking status: Former Research scientist (life sciences)  . Smokeless tobacco: Never Used  . Alcohol Use: Yes     Comment: socially   OB History   Grav Para Term Preterm Abortions TAB SAB Ect Mult Living                 Review of Systems  Constitutional: Negative.   HENT: Positive for dental problem.   Respiratory: Negative.   Gastrointestinal: Negative.   Neurological: Negative.     Allergies  Review of patient's allergies indicates no known allergies.  Home Medications   Current Outpatient Rx  Name  Route  Sig  Dispense  Refill  . albuterol (PROVENTIL HFA) 108 (90 BASE) MCG/ACT inhaler   Inhalation   Inhale 2 puffs into the lungs every 6 (six) hours as needed for wheezing.   3 Inhaler   1     3 month supply   . amoxicillin (AMOXIL) 500 MG capsule   Oral   Take 1 capsule (500 mg total) by mouth 3 (three) times daily.   30 capsule   0     Dispense as written.   Marland Kitchen amoxicillin (AMOXIL) 500 MG capsule   Oral   Take 2 capsules (1,000 mg total) by mouth 2 (two) times  daily.   28 capsule   0   . FLUoxetine (PROZAC) 20 MG capsule   Oral   Take 20 mg by mouth daily.           Marland Kitchen gabapentin (NEURONTIN) 400 MG capsule   Oral   Take 400 mg by mouth. 1 capsule in am and 2 capsule in pm.         . HYDROcodone-acetaminophen (NORCO) 5-325 MG per tablet   Oral   Take 1 tablet by mouth every 6 (six) hours as needed for pain.   10 tablet   0   . HYDROcodone-acetaminophen (NORCO/VICODIN) 5-325 MG per tablet   Oral   Take 2 tablets by mouth every 12 (twelve) hours as needed for pain.   60 tablet   0   . HYDROcodone-acetaminophen (NORCO/VICODIN) 5-325 MG per tablet   Oral   Take 1 tablet by mouth every 4 (four) hours as needed.   15 tablet   0   . HYDROcodone-acetaminophen (VICODIN ES) 7.5-750 MG per tablet   Oral   Take 1 tablet by mouth every 6 (six) hours as needed for pain.   [place]  tablet   0   . ketorolac (TORADOL) 10 MG tablet   Oral   Take 1 tablet (10 mg total) by mouth every 6 (six) hours as needed for pain (max 40mg  per day).   20 tablet   0   . lisinopril-hydrochlorothiazide (PRINZIDE,ZESTORETIC) 20-12.5 MG per tablet   Oral   Take 1 tablet by mouth daily.   30 tablet   5   . meloxicam (MOBIC) 15 MG tablet   Oral   Take 1 tablet (15 mg total) by mouth daily.   15 tablet   0   . metroNIDAZOLE (FLAGYL) 500 MG tablet   Oral   Take 1 tablet (500 mg total) by mouth 3 (three) times daily.   30 tablet   0   . nabumetone (RELAFEN) 750 MG tablet   Oral   Take 1 tablet (750 mg total) by mouth 2 (two) times daily.   60 tablet   1   . penicillin v potassium (VEETID) 500 MG tablet   Oral   Take 1 tablet (500 mg total) by mouth 3 (three) times daily.   30 tablet   0   . pravastatin (PRAVACHOL) 40 MG tablet   Oral   Take 1 tablet (40 mg total) by mouth daily.   30 tablet   5   . QUEtiapine (SEROQUEL) 50 MG tablet   Oral   Take 50 mg by mouth at bedtime.         . traMADol (ULTRAM) 50 MG tablet   Oral   Take 1  tablet (50 mg total) by mouth 2 (two) times daily as needed for pain.   60 tablet   0    BP 142/103  Pulse 77  Temp(Src) 98.8 F (37.1 C) (Oral)  Resp 18  SpO2 100% Physical Exam  Nursing note and vitals reviewed. Constitutional: She is oriented to person, place, and time. She appears well-developed and well-nourished.  HENT:  There are a few teeth remaining in her mouth. The upper incisors and lower canine incisor with multiple caries and in enamel erosions. These are the sites of pain and dental tenderness.  There is various areas of  gingival erythema  and a few areas of swelling. No abscess formation seen. No facial swelling seen.  Eyes: Conjunctivae are normal.  Neck: Normal range of motion. Neck supple.  Pulmonary/Chest: Effort normal.  Neurological: She is alert and oriented to person, place, and time.  Skin: Skin is warm and dry.    ED Course  Procedures (including critical care time) Labs Review Labs Reviewed - No data to display Imaging Review No results found.    MDM   1. Chronic dental pain     Norco 5 mg every 4-6 hours when necessary pain 15 Amoxicillin 1 g by mouth twice a day for 7 days May followup with the adult clinic as well as clinic to receive medicines that we often prescribed also needs to see dentist as soon as possible.   Janne Napoleon, NP 02/24/13 [place], NP 02/24/13 1800

## 2013-02-24 NOTE — Discharge Instructions (Signed)
Dental Care and Dentist Visits Dental care supports good overall health. Regular dental visits can also help you avoid dental pain, bleeding, infection, and other more serious health problems in the future. It is important to keep the mouth healthy because diseases in the teeth, gums, and other oral tissues can spread to other areas of the body. Some problems, such as diabetes, heart disease, and pre-term labor have been associated with poor oral health.  See your dentist every 6 months. If you experience emergency problems such as a toothache or broken tooth, go to the dentist right away. If you see your dentist regularly, you may catch problems early. It is easier to be treated for problems in the early stages.  WHAT TO EXPECT AT A DENTIST VISIT  Your dentist will look for many common oral health problems and recommend proper treatment. At your regular dental visit, you can expect:  Gentle cleaning of the teeth and gums. This includes scraping and polishing. This helps to remove the sticky substance around the teeth and gums (plaque). Plaque forms in the mouth shortly after eating. Over time, plaque hardens on the teeth as tartar. If tartar is not removed regularly, it can cause problems. Cleaning also helps remove stains.  Periodic X-rays. These pictures of the teeth and supporting bone will help your dentist assess the health of your teeth.  Periodic fluoride treatments. Fluoride is a natural mineral shown to help strengthen teeth. Fluoride treatmentinvolves applying a fluoride gel or varnish to the teeth. It is most commonly done in children.  Examination of the mouth, tongue, jaws, teeth, and gums to look for any oral health problems, such as:  Cavities (dental caries). This is decay on the tooth caused by plaque, sugar, and acid in the mouth. It is best to catch a cavity when it is small.  Inflammation of the gums caused by plaque buildup (gingivitis).  Problems with the mouth or malformed  or misaligned teeth.  Oral cancer or other diseases of the soft tissues or jaws. KEEP YOUR TEETH AND GUMS HEALTHY For healthy teeth and gums, follow these general guidelines as well as your dentist's specific advice:  Have your teeth professionally cleaned at the dentist every 6 months.  Brush twice daily with a fluoride toothpaste.  Floss your teeth daily.  Ask your dentist if you need fluoride supplements, treatments, or fluoride toothpaste.  Eat a healthy diet. Reduce foods and drinks with added sugar.  Avoid smoking. TREATMENT FOR ORAL HEALTH PROBLEMS If you have oral health problems, treatment varies depending on the conditions present in your teeth and gums.  Your caregiver will most likely recommend good oral hygiene at each visit.  For cavities, gingivitis, or other oral health disease, your caregiver will perform a procedure to treat the problem. This is typically done at a separate appointment. Sometimes your caregiver will refer you to another dental specialist for specific tooth problems or for surgery. SEEK IMMEDIATE DENTAL CARE IF:  You have pain, bleeding, or soreness in the gum, tooth, jaw, or mouth area.  A permanent tooth becomes loose or separated from the gum socket.  You experience a blow or injury to the mouth or jaw area. Document Released: 10/10/2010 Document Revised: 04/22/2011 Document Reviewed: 10/10/2010 [facility] Patient Information 2014 [place], [place].  Dental Pain A tooth ache may be caused by cavities (tooth decay). Cavities expose the nerve of the tooth to air and hot or cold temperatures. It may come from an infection or abscess (also called a  boil or furuncle) around your tooth. It is also often caused by dental caries (tooth decay). This causes the pain you are having. DIAGNOSIS  Your caregiver can diagnose this problem by exam. TREATMENT   If caused by an infection, it may be treated with medications which kill germs (antibiotics) and pain  medications as prescribed by your caregiver. Take medications as directed.  Only take over-the-counter or prescription medicines for pain, discomfort, or fever as directed by your caregiver.  Whether the tooth ache today is caused by infection or dental disease, you should see your dentist as soon as possible for further care. SEEK MEDICAL CARE IF: The exam and treatment you received today has been provided on an emergency basis only. This is not a substitute for complete medical or dental care. If your problem worsens or new problems (symptoms) appear, and you are unable to meet with your dentist, call or return to this location. SEEK IMMEDIATE MEDICAL CARE IF:   You have a fever.  You develop redness and swelling of your face, jaw, or neck.  You are unable to open your mouth.  You have severe pain uncontrolled by pain medicine. MAKE SURE YOU:   Understand these instructions.  Will watch your condition.  Will get help right away if you are not doing well or get worse. Document Released: 01/28/2005 Document Revised: 04/22/2011 Document Reviewed: 09/16/2007 [facility] Patient Information 2014 [place].

## 2013-02-25 NOTE — ED Provider Notes (Signed)
Medical screening examination/treatment/procedure(s) were performed by a resident physician or non-physician practitioner and as the supervising physician I was immediately available for consultation/collaboration.  Lynne Leader, MD    Gregor Hams, MD 02/25/13 [phone]

## 2013-03-02 ENCOUNTER — Ambulatory Visit: Payer: Self-pay

## 2013-03-03 ENCOUNTER — Other Ambulatory Visit: Payer: Self-pay | Admitting: *Deleted

## 2013-03-03 ENCOUNTER — Ambulatory Visit: Payer: Self-pay

## 2013-03-03 MED ORDER — LISINOPRIL-HYDROCHLOROTHIAZIDE 20-12.5 MG PO TABS: 1.0000 | ORAL_TABLET | Freq: Every day | ORAL | Status: DC

## 2013-03-05 ENCOUNTER — Ambulatory Visit: Payer: Self-pay

## 2013-03-09 ENCOUNTER — Ambulatory Visit: Payer: Self-pay

## 2013-03-11 ENCOUNTER — Encounter: Payer: Self-pay | Admitting: Internal Medicine

## 2013-03-11 ENCOUNTER — Ambulatory Visit (INDEPENDENT_AMBULATORY_CARE_PROVIDER_SITE_OTHER): Payer: No Typology Code available for payment source | Admitting: Internal Medicine

## 2013-03-11 VITALS — BP 152/103 | HR 81 | Temp 98.1°F | Wt 196.3 lb

## 2013-03-11 DIAGNOSIS — M5430 Sciatica, unspecified side: Secondary | ICD-10-CM

## 2013-03-11 DIAGNOSIS — R200 Anesthesia of skin: Secondary | ICD-10-CM

## 2013-03-11 DIAGNOSIS — I10 Essential (primary) hypertension: Secondary | ICD-10-CM

## 2013-03-11 DIAGNOSIS — M5416 Radiculopathy, lumbar region: Secondary | ICD-10-CM

## 2013-03-11 DIAGNOSIS — R202 Paresthesia of skin: Secondary | ICD-10-CM

## 2013-03-11 DIAGNOSIS — IMO0002 Reserved for concepts with insufficient information to code with codable children: Secondary | ICD-10-CM

## 2013-03-11 DIAGNOSIS — R209 Unspecified disturbances of skin sensation: Secondary | ICD-10-CM

## 2013-03-11 MED ORDER — CYCLOBENZAPRINE HCL 10 MG PO TABS: 10.0000 mg | ORAL_TABLET | Freq: Every evening | ORAL | Status: DC | PRN

## 2013-03-11 MED ORDER — IBUPROFEN 600 MG PO TABS: 600.0000 mg | ORAL_TABLET | Freq: Three times a day (TID) | ORAL | Status: AC | PRN

## 2013-03-11 NOTE — Assessment & Plan Note (Signed)
Pt c/o right UE/hand numbness and tingling that has been going on for "a while."  She was seen about a year ago for similar symptoms and was instructed to use a wrist splint and was thought to be CTS.  She did not recall being told to wear a splint.  TSH was tested but was wnl.  She is seeing sports medicine for chronic knee pain and perhaps they may be able to help her in securing a splint.  On exam, she had numbness in the median nerve distribution and had +tinel's sign.  She was without weakness of the UE.    -follow-up with sports medicine -continue conservative measures including pain control with NSAIDS/APAP and wearing wrist splint at night (pt needs to obtain this)

## 2013-03-11 NOTE — Patient Instructions (Signed)
Thank you for your visit today. Please return to the internal medicine clinic in 6 weeks or sooner if needed.    Your current medical regimen is effective;  continue present plan and all medications. I have provided you with a prescription for muscle spasms to be sent to your pharmacy for flexeril.  I have also given you some ibuprofen to take as needed for pain.  You should follow-up with sports medicine to find out if they can assist with a wrist brace for your right hand or you may purchase one at your drug store to be worn at night. If you believe that you are suffering from a life threatening condition or one that may result in the loss of limb or function, then you should call 911 or proceed to the nearest Emergency Department.

## 2013-03-11 NOTE — Progress Notes (Signed)
Subjective:   [name] female    DOB: 04-12-1950 63 y.o.    MRN: [id number] Health Maintenance Due: Health Maintenance Due  Topic Date Due  . Pap Smear  11/15/2011  . Zostavax  12/23/2011  . Influenza Vaccine  09/11/2012  . Mammogram  01/10/2013    _____________________________________________________________________  HPI: [name] is a 63 y.o. female here for a routine/acute visit.  Pt has a PMH outlined below.  Please see problem-based charting assessment and plan note for further details of medical issues addressed at today's visit.  PMH: Past Medical History  Diagnosis Date  . Hyperlipidemia   . Hypertension   . GERD (gastroesophageal reflux disease)   . Depression   . Right knee pain     posterior horn medial meniscal tear MRI 2013  . Lumbar radiculopathy, chronic   . Cervicalgia     Medications: Current Outpatient Prescriptions on File Prior to Visit  Medication Sig Dispense Refill  . albuterol (PROVENTIL HFA) 108 (90 BASE) MCG/ACT inhaler Inhale 2 puffs into the lungs every 6 (six) hours as needed for wheezing.  3 Inhaler  1  . amoxicillin (AMOXIL) 500 MG capsule Take 1 capsule (500 mg total) by mouth 3 (three) times daily.  30 capsule  0  . amoxicillin (AMOXIL) 500 MG capsule Take 2 capsules (1,000 mg total) by mouth 2 (two) times daily.  28 capsule  0  . FLUoxetine (PROZAC) 20 MG capsule Take 20 mg by mouth daily.        Marland Kitchen gabapentin (NEURONTIN) 400 MG capsule Take 400 mg by mouth. 1 capsule in am and 2 capsule in pm.      . HYDROcodone-acetaminophen (NORCO) 5-325 MG per tablet Take 1 tablet by mouth every 6 (six) hours as needed for pain.  10 tablet  0  . HYDROcodone-acetaminophen (NORCO/VICODIN) 5-325 MG per tablet Take 2 tablets by mouth every 12 (twelve) hours as needed for pain.  60 tablet  0  . HYDROcodone-acetaminophen (NORCO/VICODIN) 5-325 MG per tablet Take 1 tablet by mouth every 4 (four) hours as needed.  15 tablet  0  .  HYDROcodone-acetaminophen (VICODIN ES) 7.5-750 MG per tablet Take 1 tablet by mouth every 6 (six) hours as needed for pain.  30 tablet  0  . ketorolac (TORADOL) 10 MG tablet Take 1 tablet (10 mg total) by mouth every 6 (six) hours as needed for pain (max 40mg  per day).  20 tablet  0  . lisinopril-hydrochlorothiazide (PRINZIDE,ZESTORETIC) 20-12.5 MG per tablet Take 1 tablet by mouth daily.  90 tablet  4  . meloxicam (MOBIC) 15 MG tablet Take 1 tablet (15 mg total) by mouth daily.  15 tablet  0  . metroNIDAZOLE (FLAGYL) 500 MG tablet Take 1 tablet (500 mg total) by mouth 3 (three) times daily.  30 tablet  0  . nabumetone (RELAFEN) 750 MG tablet Take 1 tablet (750 mg total) by mouth 2 (two) times daily.  60 tablet  1  . penicillin v potassium (VEETID) 500 MG tablet Take 1 tablet (500 mg total) by mouth 3 (three) times daily.  30 tablet  0  . pravastatin (PRAVACHOL) 40 MG tablet Take 1 tablet (40 mg total) by mouth daily.  30 tablet  5  . QUEtiapine (SEROQUEL) 50 MG tablet Take 50 mg by mouth at bedtime.      . traMADol (ULTRAM) 50 MG tablet Take 1 tablet (50 mg total) by mouth 2 (two) times daily as needed  for pain.  60 tablet  0   No current facility-administered medications on file prior to visit.    Allergies: No Known Allergies  FH: Family History  Problem Relation Age of Onset  . Hypertension Mother     SH: History   Social History  . Marital Status: Divorced    Spouse Name: N/A    Number of Children: N/A  . Years of Education: N/A   Social History Main Topics  . Smoking status: Former Research scientist (life sciences)  . Smokeless tobacco: Never Used  . Alcohol Use: Yes     Comment: socially  . Drug Use: No  . Sexual Activity: Not Currently   Other Topics Concern  . None   Social History Narrative   Single. 9 siblings. 1 brother with HIV, 1 sister with hypertension. 4 children, 1 daughter with HTN and depression, 1 son with HTN.   [phone]- shelter's number.    Former smoker, no alcohol or drug  use.    Review of Systems: Please see problem-based charting assessment and plan note for pertinent ROS.    Objective:   Vital Signs: Filed Vitals:   03/11/13 1500  BP: 152/103  Pulse: 81  Temp: 98.1 F (36.7 C)  TempSrc: Oral  Weight: 196 lb 4.8 oz (89.041 kg)  SpO2: 100%      BP Readings from Last 3 Encounters:  03/11/13 152/103  02/24/13 142/103  12/14/12 140/93    Physical Exam: Constitutional: Vital signs reviewed.  Patient is well-developed and well-nourished in NAD and cooperative with exam.  Head: Normocephalic and atraumatic. Eyes: PERRL, EOMI, conjunctivae nl, no scleral icterus.  Neck: Supple, Trachea midline, no JVD appreciated. Cardiovascular: RRR, no MRG, pulses symmetric and intact b/l. Pulmonary/Chest: normal effort, non-tender to palpation, CTAB, no wheezes, rales, or rhonchi. Abdominal: Obese. Soft. NT/ND +BS, no CVAT.  Musculoskeletal: +Phalen's sign. +SLR and +crossed SLR.  No weakness on LE b/l.  Sensation intact. Neurological: A&O x3, cranial nerves II-XII are grossly intact, moving all extremities.  DTRs: 2+ patellar b/l. Extremities: 2+DP b/l; no pitting edema. Skin: Warm, dry and intact. No rash, cyanosis, or clubbing.   Most Recent Laboratory Results:  CMP     Component Value Date/Time   NA 140 04/06/2012 1448   K 3.7 04/06/2012 1448   CL 102 04/06/2012 1448   CO2 28 04/06/2012 1448   GLUCOSE 89 04/06/2012 1448   BUN 10 04/06/2012 1448   CREATININE 0.90 04/06/2012 1448   CREATININE 1.31* 02/08/2011 1902   CALCIUM 9.6 04/06/2012 1448   PROT 7.5 04/06/2012 1448   ALBUMIN 4.4 04/06/2012 1448   AST 24 04/06/2012 1448   ALT 19 04/06/2012 1448   ALKPHOS 78 04/06/2012 1448   BILITOT 0.3 04/06/2012 1448   GFRNONAA 44* 02/08/2011 1902   GFRAA 51* 02/08/2011 1902    CBC    Component Value Date/Time   WBC 5.1 07/23/2011 1501   RBC 4.27 07/23/2011 1501   RBC 3.68* 07/22/2008 0620   HGB 11.2* 07/23/2011 1501   HCT 35.1* 07/23/2011 1501   PLT 331  07/23/2011 1501   MCV 82.2 07/23/2011 1501   MCH 26.2 07/23/2011 1501   MCHC 31.9 07/23/2011 1501   RDW 14.8 07/23/2011 1501   LYMPHSABS 2.1 02/08/2011 1902   MONOABS 0.6 02/08/2011 1902   EOSABS 0.1 02/08/2011 1902   BASOSABS 0.0 02/08/2011 1902    Lipid Panel Lab Results  Component Value Date   CHOL 278* 04/06/2012   HDL 43 04/06/2012   LDLCALC  194* 04/06/2012   TRIG 204* 04/06/2012   CHOLHDL 6.5 04/06/2012    HA1C Lab Results  Component Value Date   HGBA1C 5.4 04/06/2012    Urinalysis    Component Value Date/Time   COLORURINE YELLOW 02/08/2011 2008   APPEARANCEUR HAZY* 02/08/2011 2008   LABSPEC 1.016 02/08/2011 2008   PHURINE 5.0 02/08/2011 2008   GLUCOSEU NEGATIVE 02/08/2011 2008   HGBUR NEGATIVE 02/08/2011 2008   BILIRUBINUR SMALL* 02/08/2011 2008   BILIRUBINUR negative 10/18/2010 [place] 02/08/2011 2008   PROTEINUR 30* 02/08/2011 2008   UROBILINOGEN 1.0 02/08/2011 2008   UROBILINOGEN 0.2 10/18/2010 1546   NITRITE NEGATIVE 02/08/2011 2008   NITRITE negative 10/18/2010 1546   LEUKOCYTESUR SMALL* 02/08/2011 2008    Urine Microalbumin No results found for this basename: MICROALBUR, [id number]    Imaging N/A   Assessment & Plan:   Assessment and plan was discussed and formulated with my attending.

## 2013-03-11 NOTE — Assessment & Plan Note (Signed)
Pt BP elevated but states she has been out of her BP medication for ~1 week.  States she will get in today.  -continue to monitor

## 2013-03-11 NOTE — Assessment & Plan Note (Signed)
Pt presents to clinic for an acute visit for lumbar radiculopathy that is long-standing.  She reports it has recently gotten worse with greater right lower back pain with radiation down the right LE.  She reports it feels like "spasms."  She denies any red flag symptoms.  She reports taking ibuprofen or other NSAIDs does not provide her with relief.  XR lumbar spine 2011 revealed DDD. According to notes she has tried flexeril in the past which has helped.  On exam, there is no spinal or paraspinal tenderness.  Paraspinal muscles do not feel tight.  +SLR and +crossed SLR with intact sensation and without weakness.  She follows-up with sports medicine for chronic knee pain.  -continue conservative management -defer imaging at this time -flexeril at bedtime -ibuprofen PRN  -f/u with PCP in 6 weeks

## 2013-03-12 ENCOUNTER — Other Ambulatory Visit: Payer: Self-pay | Admitting: Internal Medicine

## 2013-03-15 NOTE — Progress Notes (Signed)
Case discussed with Dr. Gill soon after the resident saw the patient.  We reviewed the resident's history and exam and pertinent patient test results.  I agree with the assessment, diagnosis, and plan of care documented in the resident's note. 

## 2013-03-30 ENCOUNTER — Ambulatory Visit: Payer: No Typology Code available for payment source | Admitting: Sports Medicine

## 2013-04-06 ENCOUNTER — Ambulatory Visit: Payer: No Typology Code available for payment source | Admitting: Sports Medicine

## 2013-04-21 ENCOUNTER — Ambulatory Visit (INDEPENDENT_AMBULATORY_CARE_PROVIDER_SITE_OTHER): Payer: No Typology Code available for payment source | Admitting: Sports Medicine

## 2013-04-21 ENCOUNTER — Encounter: Payer: Self-pay | Admitting: Sports Medicine

## 2013-04-21 VITALS — BP 163/89 | Ht 65.0 in | Wt 197.0 lb

## 2013-04-21 DIAGNOSIS — M542 Cervicalgia: Secondary | ICD-10-CM

## 2013-04-21 DIAGNOSIS — M25519 Pain in unspecified shoulder: Secondary | ICD-10-CM

## 2013-04-21 MED ORDER — KETOROLAC TROMETHAMINE 60 MG/2ML IM SOLN
60.0000 mg | Freq: Once | INTRAMUSCULAR | Status: AC
  Administered 2013-04-21: 60 mg via INTRAMUSCULAR

## 2013-04-21 MED ORDER — METHYLPREDNISOLONE ACETATE 80 MG/ML IJ SUSP
80.0000 mg | Freq: Once | INTRAMUSCULAR | Status: AC
  Administered 2013-04-21: 80 mg via INTRAMUSCULAR
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
## 2013-04-26 ENCOUNTER — Ambulatory Visit: Payer: No Typology Code available for payment source | Admitting: Internal Medicine

## 2013-04-26 ENCOUNTER — Telehealth: Payer: Self-pay | Admitting: Sports Medicine

## 2013-04-26 NOTE — Telephone Encounter (Signed)
I was unable to followup with this patient via telephone today to discuss the x-rays that have been ordered. As of this dictation, patient has not been seen neither at the hospital nor at [facility] for her x-rays.

## 2013-04-30 ENCOUNTER — Other Ambulatory Visit: Payer: Self-pay | Admitting: Sports Medicine

## 2013-04-30 ENCOUNTER — Ambulatory Visit
Admission: RE | Admit: 2013-04-30 | Discharge: 2013-04-30 | Disposition: A | Discharge status: Home / Self Care | Payer: No Typology Code available for payment source | Source: Ambulatory Visit | Attending: Sports Medicine | Admitting: Sports Medicine

## 2013-04-30 DIAGNOSIS — M25519 Pain in unspecified shoulder: Secondary | ICD-10-CM

## 2013-04-30 DIAGNOSIS — M542 Cervicalgia: Principal | ICD-10-CM

## 2013-05-04 ENCOUNTER — Telehealth: Payer: Self-pay | Admitting: Sports Medicine

## 2013-05-04 ENCOUNTER — Other Ambulatory Visit: Payer: Self-pay | Admitting: *Deleted

## 2013-05-04 DIAGNOSIS — M545 Low back pain, unspecified: Secondary | ICD-10-CM

## 2013-05-04 NOTE — Telephone Encounter (Signed)
I spoke with the patient on the phone today after reviewing x-rays of her cervical spine and lumbar spine. Cervical spine x-ray shows marked degenerative disc disease at C5-C6. Lumbar spine shows degenerative disc disease at L2-L3, L3-L4, and L4-L5 with minimal retrolisthesis of L3 on L4. Patient is complaining primarily of low back pain with radiating pain into the right leg. The IM injections of Depo-Medrol and Toradol were temporarily helpful but her pain has now returned. Therefore, we will proceed with an MRI scan to rule out a lumbar disc herniation which may benefit from lumbar epidural steroid injections. I've asked the patient to followup with me in the days following the MRI to go over those results and delineate further treatment.

## 2013-05-10 ENCOUNTER — Ambulatory Visit
Admission: RE | Admit: 2013-05-10 | Discharge: 2013-05-10 | Disposition: A | Discharge status: Home / Self Care | Payer: No Typology Code available for payment source | Source: Ambulatory Visit | Attending: Sports Medicine | Admitting: Sports Medicine

## 2013-05-10 DIAGNOSIS — M545 Low back pain, unspecified: Secondary | ICD-10-CM

## 2013-05-21 ENCOUNTER — Ambulatory Visit: Payer: No Typology Code available for payment source | Admitting: Sports Medicine

## 2013-05-24 ENCOUNTER — Emergency Department (INDEPENDENT_AMBULATORY_CARE_PROVIDER_SITE_OTHER)
Admission: EM | Admit: 2013-05-24 | Discharge: 2013-05-24 | Disposition: A | Discharge status: Home / Self Care | Payer: No Typology Code available for payment source | Source: Home / Self Care | Attending: Family Medicine | Admitting: Family Medicine

## 2013-05-24 ENCOUNTER — Encounter (HOSPITAL_COMMUNITY): Payer: Self-pay | Admitting: Emergency Medicine

## 2013-05-24 DIAGNOSIS — H1013 Acute atopic conjunctivitis, bilateral: Secondary | ICD-10-CM

## 2013-05-24 DIAGNOSIS — J309 Allergic rhinitis, unspecified: Secondary | ICD-10-CM

## 2013-05-24 DIAGNOSIS — H1045 Other chronic allergic conjunctivitis: Secondary | ICD-10-CM

## 2013-05-24 DIAGNOSIS — J302 Other seasonal allergic rhinitis: Secondary | ICD-10-CM

## 2013-05-24 MED ORDER — FLUTICASONE PROPIONATE 50 MCG/ACT NA SUSP: 1.0000 | Freq: Two times a day (BID) | NASAL | Status: DC

## 2013-05-24 MED ORDER — METHYLPREDNISOLONE ACETATE 80 MG/ML IJ SUSP
INTRAMUSCULAR | Status: AC
  Filled 2013-05-24: qty 1

## 2013-05-24 MED ORDER — TRIAMCINOLONE ACETONIDE 40 MG/ML IJ SUSP
40.0000 mg | Freq: Once | INTRAMUSCULAR | Status: AC
Start: 2013-05-24 — End: 2013-05-24
  Administered 2013-05-24: 40 mg via INTRAMUSCULAR

## 2013-05-24 MED ORDER — ALBUTEROL SULFATE HFA 108 (90 BASE) MCG/ACT IN AERS: 1.0000 | INHALATION_SPRAY | Freq: Four times a day (QID) | RESPIRATORY_TRACT | Status: DC | PRN

## 2013-05-24 MED ORDER — OLOPATADINE HCL 0.2 % OP SOLN: 1.0000 [drp] | Freq: Two times a day (BID) | OPHTHALMIC | Status: DC

## 2013-05-24 MED ORDER — METHYLPREDNISOLONE ACETATE 40 MG/ML IJ SUSP
80.0000 mg | Freq: Once | INTRAMUSCULAR | Status: AC
Start: 2013-05-24 — End: 2013-05-24
  Administered 2013-05-24: 80 mg via INTRAMUSCULAR

## 2013-05-24 MED ORDER — TRIAMCINOLONE ACETONIDE 40 MG/ML IJ SUSP
INTRAMUSCULAR | Status: AC
  Filled 2013-05-24: qty 1

## 2013-05-24 MED ORDER — HYDROCOD POLST-CHLORPHEN POLST 10-8 MG/5ML PO LQCR: 5.0000 mL | Freq: Two times a day (BID) | ORAL | Status: DC | PRN

## 2013-05-24 NOTE — ED Notes (Signed)
Raspy breathing, cough, sniffles

## 2013-05-24 NOTE — ED Provider Notes (Addendum)
CSN: [id number]     Arrival date & time 05/24/13  1249 History   First MD Initiated Contact with Patient 05/24/13 1504     Chief Complaint  Patient presents with  . URI   (Consider location/radiation/quality/duration/timing/severity/associated sxs/prior Treatment) Patient is a 63 y.o. female presenting with URI. The history is provided by the patient.  URI Presenting symptoms: congestion, cough and rhinorrhea   Presenting symptoms: no fever   Severity:  Moderate Duration:  1 week Progression:  Worsening Chronicity:  New (seasonal spring sx.) Associated symptoms: sneezing   Associated symptoms: no wheezing   Associated symptoms comment:  Watery eyes.   Past Medical History  Diagnosis Date  . Hyperlipidemia   . Hypertension   . GERD (gastroesophageal reflux disease)   . Depression   . Right knee pain     posterior horn medial meniscal tear MRI 2013  . Lumbar radiculopathy, chronic   . Cervicalgia    Past Surgical History  Procedure Laterality Date  . Hysterectomy other    . Bladder tack  10/2006    cystocoele  . Appendectomy     Family History  Problem Relation Age of Onset  . Hypertension Mother    History  Substance Use Topics  . Smoking status: Former Research scientist (life sciences)  . Smokeless tobacco: Never Used  . Alcohol Use: Yes     Comment: socially   OB History   Grav Para Term Preterm Abortions TAB SAB Ect Mult Living                 Review of Systems  Constitutional: Negative.  Negative for fever.  HENT: Positive for congestion, postnasal drip, rhinorrhea and sneezing.   Respiratory: Positive for cough. Negative for shortness of breath and wheezing.   Cardiovascular: Negative.  Negative for leg swelling.  Gastrointestinal: Negative.     Allergies  Review of patient's allergies indicates no known allergies.  Home Medications   Current Outpatient Rx  Name  Route  Sig  Dispense  Refill  . VENTOLIN HFA 108 (90 BASE) MCG/ACT inhaler      INAHLE 2 PUFFS EVERY 6  HOURS AS NEEDED FOR WHEEZING   54 each   1   . albuterol (PROVENTIL HFA;VENTOLIN HFA) 108 (90 BASE) MCG/ACT inhaler   Inhalation   Inhale 1-2 puffs into the lungs every 6 (six) hours as needed for wheezing or shortness of breath.   1 Inhaler   0   . amoxicillin (AMOXIL) 500 MG capsule   Oral   Take 1 capsule (500 mg total) by mouth 3 (three) times daily.   30 capsule   0     Dispense as written.   Marland Kitchen amoxicillin (AMOXIL) 500 MG capsule   Oral   Take 2 capsules (1,000 mg total) by mouth 2 (two) times daily.   28 capsule   0   . chlorpheniramine-HYDROcodone (TUSSIONEX PENNKINETIC ER) 10-8 MG/5ML LQCR   Oral   Take 5 mLs by mouth every 12 (twelve) hours as needed for cough.   115 mL   0   . cyclobenzaprine (FLEXERIL) 10 MG tablet   Oral   Take 1 tablet (10 mg total) by mouth at bedtime as needed for muscle spasms.   30 tablet   0   . FLUoxetine (PROZAC) 20 MG capsule   Oral   Take 20 mg by mouth daily.           . fluticasone (FLONASE) 50 MCG/ACT nasal spray   Each Nare  Place 1 spray into both nostrils 2 (two) times daily.   1 g   2   . gabapentin (NEURONTIN) 400 MG capsule   Oral   Take 400 mg by mouth. 1 capsule in am and 2 capsule in pm.         . HYDROcodone-acetaminophen (NORCO) 5-325 MG per tablet   Oral   Take 1 tablet by mouth every 6 (six) hours as needed for pain.   10 tablet   0   . HYDROcodone-acetaminophen (NORCO/VICODIN) 5-325 MG per tablet   Oral   Take 2 tablets by mouth every 12 (twelve) hours as needed for pain.   60 tablet   0   . HYDROcodone-acetaminophen (NORCO/VICODIN) 5-325 MG per tablet   Oral   Take 1 tablet by mouth every 4 (four) hours as needed.   15 tablet   0   . HYDROcodone-acetaminophen (VICODIN ES) 7.5-750 MG per tablet   Oral   Take 1 tablet by mouth every 6 (six) hours as needed for pain.   30 tablet   0   . ketorolac (TORADOL) 10 MG tablet   Oral   Take 1 tablet (10 mg total) by mouth every 6 (six) hours  as needed for pain (max 40mg  per day).   20 tablet   0   . lisinopril-hydrochlorothiazide (PRINZIDE,ZESTORETIC) 20-12.5 MG per tablet   Oral   Take 1 tablet by mouth daily.   90 tablet   4   . meloxicam (MOBIC) 15 MG tablet   Oral   Take 1 tablet (15 mg total) by mouth daily.   15 tablet   0   . metroNIDAZOLE (FLAGYL) 500 MG tablet   Oral   Take 1 tablet (500 mg total) by mouth 3 (three) times daily.   30 tablet   0   . nabumetone (RELAFEN) 750 MG tablet   Oral   Take 1 tablet (750 mg total) by mouth 2 (two) times daily.   60 tablet   1   . Olopatadine HCl 0.2 % SOLN   Ophthalmic   Apply 1 drop to eye 2 (two) times daily. To both eyes   1 Bottle   0   . penicillin v potassium (VEETID) 500 MG tablet   Oral   Take 1 tablet (500 mg total) by mouth 3 (three) times daily.   30 tablet   0   . pravastatin (PRAVACHOL) 40 MG tablet   Oral   Take 1 tablet (40 mg total) by mouth daily.   30 tablet   5   . QUEtiapine (SEROQUEL) 50 MG tablet   Oral   Take 50 mg by mouth at bedtime.         . traMADol (ULTRAM) 50 MG tablet   Oral   Take 1 tablet (50 mg total) by mouth 2 (two) times daily as needed for pain.   60 tablet   0    BP 152/93  Pulse 66  Temp(Src) 98.5 F (36.9 C) (Oral)  Resp 16  SpO2 100% Physical Exam  Nursing note and vitals reviewed. Constitutional: She is oriented to person, place, and time. She appears well-developed and well-nourished. No distress.  HENT:  Head: Normocephalic.  Right Ear: External ear normal.  Left Ear: External ear normal.  Nose: Mucosal edema and rhinorrhea present.  Mouth/Throat: Oropharynx is clear and moist.  Eyes: Pupils are equal, round, and reactive to light. Right eye exhibits discharge. Left eye exhibits discharge.  Neck: Normal  range of motion. Neck supple.  Cardiovascular: Regular rhythm and normal heart sounds.   Pulmonary/Chest: Effort normal and breath sounds normal. She has no wheezes.  Abdominal: Soft.  Bowel sounds are normal.  Lymphadenopathy:    She has no cervical adenopathy.  Neurological: She is alert and oriented to person, place, and time.  Skin: Skin is warm and dry.    ED Course  Procedures (including critical care time) Labs Review Labs Reviewed - No data to display Imaging Review No results found.   MDM   1. Seasonal allergic rhinitis   2. Allergic conjunctivitis of both eyes        Billy Fischer, MD 05/24/13 [place], MD 05/24/13 [phone]

## 2013-05-25 NOTE — ED Notes (Signed)
Late entry.  Patient called earlier today with concerns for how to get medicine.  This nurse spoke to dr Gwenith Spitz were written generic initially-no other cheaper medicines available.  Called pharmacist at [address] were patient took scripts initially.  Pharmacist gave suggestions of products that would be cheaper-flonase-over the counter cheapest, olopatadine-suggested zaditor, naphcon-a-pharmacist suggested changing cough med to hycodan and albuterol to ventolin/proair.  Patient called and said she decided to check with clinic for inhaler.  Eye drops and nasal spray she accepted pharmacy suggestion, cough medicine-she had talked to a different pharmacy-[facility] that suggested only purchasing a smaller amount of cough medicine.  Patient has no further requests at this time.  Patient comfortable with the plan for medications

## 2013-05-25 NOTE — ED Notes (Signed)
Accessed record for patient request

## 2013-06-03 ENCOUNTER — Ambulatory Visit: Payer: No Typology Code available for payment source | Admitting: Sports Medicine

## 2013-06-14 ENCOUNTER — Ambulatory Visit: Payer: No Typology Code available for payment source | Admitting: Sports Medicine

## 2013-06-25 ENCOUNTER — Other Ambulatory Visit: Payer: Self-pay | Admitting: Internal Medicine

## 2013-06-25 NOTE — Progress Notes (Unsigned)
Patient needs the following health maintenance at her next [facility] follow up appointment: -Pap smear -Zostavax -MAMMOGRAM (priority)  Signed: Corky Sox, MD 06/25/2013 10:41 AM

## 2013-07-02 ENCOUNTER — Ambulatory Visit (INDEPENDENT_AMBULATORY_CARE_PROVIDER_SITE_OTHER): Payer: No Typology Code available for payment source | Admitting: Sports Medicine

## 2013-07-02 ENCOUNTER — Telehealth: Payer: Self-pay | Admitting: *Deleted

## 2013-07-02 ENCOUNTER — Other Ambulatory Visit: Payer: Self-pay | Admitting: *Deleted

## 2013-07-02 ENCOUNTER — Encounter: Payer: Self-pay | Admitting: Sports Medicine

## 2013-07-02 ENCOUNTER — Other Ambulatory Visit: Payer: Self-pay | Admitting: Sports Medicine

## 2013-07-02 VITALS — BP 145/103 | Ht 65.0 in | Wt 192.0 lb

## 2013-07-02 DIAGNOSIS — M5430 Sciatica, unspecified side: Secondary | ICD-10-CM

## 2013-07-02 DIAGNOSIS — H1045 Other chronic allergic conjunctivitis: Secondary | ICD-10-CM

## 2013-07-02 DIAGNOSIS — M549 Dorsalgia, unspecified: Secondary | ICD-10-CM

## 2013-07-02 DIAGNOSIS — J309 Allergic rhinitis, unspecified: Secondary | ICD-10-CM

## 2013-07-02 DIAGNOSIS — M48061 Spinal stenosis, lumbar region without neurogenic claudication: Secondary | ICD-10-CM | POA: Insufficient documentation

## 2013-07-02 MED ORDER — CYCLOBENZAPRINE HCL 10 MG PO TABS: 10.0000 mg | ORAL_TABLET | Freq: Every evening | ORAL | Status: DC | PRN

## 2013-07-02 MED ORDER — METHYLPREDNISOLONE ACETATE 80 MG/ML IJ SUSP
80.0000 mg | Freq: Once | INTRAMUSCULAR | Status: AC
  Administered 2013-07-02: 80 mg via INTRAMUSCULAR

## 2013-07-02 MED ORDER — ALBUTEROL SULFATE HFA 108 (90 BASE) MCG/ACT IN AERS: 1.0000 | INHALATION_SPRAY | Freq: Four times a day (QID) | RESPIRATORY_TRACT | Status: DC | PRN

## 2013-07-02 MED ORDER — KETOROLAC TROMETHAMINE 60 MG/2ML IM SOLN
60.0000 mg | Freq: Once | INTRAMUSCULAR | Status: AC
  Administered 2013-07-02: 60 mg via INTRAMUSCULAR

## 2013-07-02 NOTE — Telephone Encounter (Signed)
Called to pharm 

## 2013-07-02 NOTE — Telephone Encounter (Signed)
Refill approved - nurse to call in. 

## 2013-07-02 NOTE — Addendum Note (Signed)
Addended by: Cyd Silence on: 07/02/2013 11:37 AM   Modules accepted: Orders

## 2013-07-02 NOTE — Telephone Encounter (Signed)
Pt presents requesting sample of inhaler. Found that she has orange card. Called [facility]. They will give her an inhaler today and she will have an appt next week for paperwork so she may be able to get inhalers for free

## 2013-07-02 NOTE — Addendum Note (Signed)
Addended by: Cyd Silence on: 07/02/2013 11:16 AM   Modules accepted: Orders

## 2013-07-02 NOTE — Addendum Note (Signed)
Addended by: Cyd Silence on: 07/02/2013 11:11 AM   Modules accepted: Orders

## 2013-07-02 NOTE — Progress Notes (Signed)
   Subjective:    [name], female    DOB: 21-May-1950, 63 y.o.   MRN: [id number]  HPI Patient comes in today to discuss MRI findings of her lumbar spine. MRI shows severe spinal stenosis at L4-L5. She continues to get radiating pain down the right leg into the right calf. Symptoms are worse with activity, particularly prolonged standing or walking. She is also getting intermittent cramping in the right calf. Associated numbness and tingling along the lateral thigh. No weakness. No groin pain. She did get some temporary symptom relief with IM injections of Depo-Medrol and Toradol at her last office visit. She also finds Flexeril to be effective for her spasms which occur not only in her calf but also along the right side of her lumbar spine.    Review of Systems     Objective:   Physical Exam Well-developed. No acute distress. Sitting comfortable in exam room.  Limited lumbar mobility secondary to pain. Positive straight leg raise on the right. No noticeable atrophy. Strength is 5/5 both lower extremities. Reflexes are trace but equal at the Achilles and patellar tendons bilaterally. Sensation intact to light touch grossly. Negative Babinski. No clonus. Walking with a limp.  MRI of her lumbar spine is as above       Assessment & Plan:  Low back pain and right leg radiculopathy secondary to severe L4-L5 spinal stenosis  I discussed the possibility of a referral to a neurosurgeon in [place] (she has seen an orthopedist at [facility] before and is aware of their indigent program). However, she is not in a position now to consider surgery. I'm going to order a lumbar ESI at [facility]. Hopefully it will buy her a little time. We will repeat her IM injections of Depo-Medrol and Toradol to date. I've also given her a refill on her Flexeril. She will followup with me after her lumbar ESI for a check on her progress.

## 2013-07-06 ENCOUNTER — Inpatient Hospital Stay: Admission: RE | Admit: 2013-07-06 | Payer: No Typology Code available for payment source | Source: Ambulatory Visit

## 2013-07-13 ENCOUNTER — Ambulatory Visit: Payer: No Typology Code available for payment source | Admitting: Internal Medicine

## 2013-07-14 ENCOUNTER — Ambulatory Visit: Payer: No Typology Code available for payment source | Admitting: Internal Medicine

## 2013-07-15 ENCOUNTER — Ambulatory Visit
Admission: RE | Admit: 2013-07-15 | Discharge: 2013-07-15 | Disposition: A | Discharge status: Home / Self Care | Payer: No Typology Code available for payment source | Source: Ambulatory Visit | Attending: Sports Medicine | Admitting: Sports Medicine

## 2013-07-15 DIAGNOSIS — M549 Dorsalgia, unspecified: Secondary | ICD-10-CM

## 2013-07-15 MED ORDER — METHYLPREDNISOLONE ACETATE 40 MG/ML INJ SUSP (RADIOLOG
120.0000 mg | Freq: Once | INTRAMUSCULAR | Status: AC
  Administered 2013-07-15: 120 mg via EPIDURAL

## 2013-07-15 MED ORDER — IOHEXOL 180 MG/ML  SOLN
1.0000 mL | Freq: Once | INTRAMUSCULAR | Status: AC | PRN
  Administered 2013-07-15: 1 mL via EPIDURAL

## 2013-07-15 NOTE — Discharge Instructions (Signed)

## 2013-07-21 ENCOUNTER — Other Ambulatory Visit: Payer: Self-pay | Admitting: Sports Medicine

## 2013-07-21 DIAGNOSIS — M549 Dorsalgia, unspecified: Secondary | ICD-10-CM

## 2013-08-05 ENCOUNTER — Encounter (HOSPITAL_COMMUNITY): Payer: Self-pay | Admitting: Emergency Medicine

## 2013-08-05 ENCOUNTER — Emergency Department (INDEPENDENT_AMBULATORY_CARE_PROVIDER_SITE_OTHER)
Admission: EM | Admit: 2013-08-05 | Discharge: 2013-08-05 | Disposition: A | Discharge status: Home / Self Care | Payer: No Typology Code available for payment source | Source: Home / Self Care | Attending: Family Medicine | Admitting: Family Medicine

## 2013-08-05 DIAGNOSIS — R0981 Nasal congestion: Secondary | ICD-10-CM

## 2013-08-05 DIAGNOSIS — J3489 Other specified disorders of nose and nasal sinuses: Secondary | ICD-10-CM

## 2013-08-05 MED ORDER — IPRATROPIUM BROMIDE 0.06 % NA SOLN: 2.0000 | Freq: Four times a day (QID) | NASAL | Status: DC

## 2013-08-05 NOTE — ED Notes (Signed)
Patient states she problems started yesterday. Patient states she had some nose bleeding, non productive cough and watery eyes. Patient does state she some tenderness in her sinus area. Patient stated she just started loosing her voice yesterday. Patient has tried an unknown OTC allergy medication with no relief. (Patient is requesting a note for work) Marya Amsler, Therapist, sports

## 2013-08-05 NOTE — ED Provider Notes (Signed)
CSN: [id number]     Arrival date & time 08/05/13  1120 History   First MD Initiated Contact with Patient 08/05/13 1140     Chief Complaint  Patient presents with  . Sinus Problem   (Consider location/radiation/quality/duration/timing/severity/associated sxs/prior Treatment) HPI Comments: Patient presents with reoccurrence of chronic nasal and sinus congestion. Reports she has used OTC allergy medications in the past with some relief. States that while on the bus riding to work yesterday, she had a brief nosebleed and decided not to go to work. States that when she arrived at work this morning (she works at [employer]) she was told she would need to provide "a doctor's slip" before she could return to work.  States her throat has also been scratchy and irritated from post nasal drainage. States this has given her a scratchy voice.  Denies fever/chills, or purulent nasal drainage.   The history is provided by the patient.    Past Medical History  Diagnosis Date  . Hyperlipidemia   . Hypertension   . GERD (gastroesophageal reflux disease)   . Depression   . Right knee pain     posterior horn medial meniscal tear MRI 2013  . Lumbar radiculopathy, chronic   . Cervicalgia    Past Surgical History  Procedure Laterality Date  . Hysterectomy other    . Bladder tack  10/2006    cystocoele  . Appendectomy     Family History  Problem Relation Age of Onset  . Hypertension Mother    History  Substance Use Topics  . Smoking status: Former Research scientist (life sciences)  . Smokeless tobacco: Never Used  . Alcohol Use: Yes     Comment: socially   OB History   Grav Para Term Preterm Abortions TAB SAB Ect Mult Living                 Review of Systems  Constitutional: Negative.   HENT: Positive for congestion, nosebleeds, postnasal drip, rhinorrhea, sinus pressure, sneezing and voice change. Negative for ear pain, facial swelling, mouth sores, sore throat, tinnitus and trouble swallowing.   Eyes: Negative.    Respiratory: Negative.   Cardiovascular: Negative.   Gastrointestinal: Negative.   Musculoskeletal: Negative.   Skin: Negative.   Neurological: Negative for headaches.    Allergies  Review of patient's allergies indicates no known allergies.  Home Medications   Prior to Admission medications   Medication Sig Start Date End Date Taking? Authorizing Branda Chaudhary  albuterol (PROVENTIL HFA;VENTOLIN HFA) 108 (90 BASE) MCG/ACT inhaler Inhale 1-2 puffs into the lungs every 6 (six) hours as needed for wheezing or shortness of breath. 07/02/13  Yes Axel Filler, MD  cyclobenzaprine (FLEXERIL) 10 MG tablet Take 1 tablet (10 mg total) by mouth at bedtime as needed for muscle spasms. 07/02/13  Yes Carlos Levering Draper, DO  FLUoxetine (PROZAC) 20 MG capsule Take 20 mg by mouth daily.     Yes Historical Kenniyah Sasaki, MD  gabapentin (NEURONTIN) 400 MG capsule Take 400 mg by mouth. 1 capsule in am and 2 capsule in pm.   Yes Historical Jossiah Smoak, MD  lisinopril-hydrochlorothiazide (PRINZIDE,ZESTORETIC) 20-12.5 MG per tablet Take 1 tablet by mouth daily. 03/03/13  Yes Corky Sox, MD  Olopatadine HCl 0.2 % SOLN Apply 1 drop to eye 2 (two) times daily. To both eyes 05/24/13  Yes Billy Fischer, MD  QUEtiapine (SEROQUEL) 50 MG tablet Take 50 mg by mouth at bedtime. 11/21/10 07/23/15 Yes Heinz Knuckles, MD  amoxicillin (AMOXIL) 500 MG capsule  Take 1 capsule (500 mg total) by mouth 3 (three) times daily. 12/14/12   Harden Mo, MD  amoxicillin (AMOXIL) 500 MG capsule Take 2 capsules (1,000 mg total) by mouth 2 (two) times daily. 02/24/13   Janne Napoleon, NP  chlorpheniramine-HYDROcodone [facility] PENNKINETIC ER) 10-8 MG/5ML LQCR Take 5 mLs by mouth every 12 (twelve) hours as needed for cough. 05/24/13   Billy Fischer, MD  cyclobenzaprine (FLEXERIL) 10 MG tablet Take 1 tablet (10 mg total) by mouth at bedtime as needed for muscle spasms. 07/02/13   Carlos Levering Draper, DO  fluticasone (FLONASE) 50 MCG/ACT nasal spray Place 1  spray into both nostrils 2 (two) times daily. 05/24/13   Billy Fischer, MD  HYDROcodone-acetaminophen (NORCO) 5-325 MG per tablet Take 1 tablet by mouth every 6 (six) hours as needed for pain. 11/07/12   Liam Graham, PA-C  HYDROcodone-acetaminophen (NORCO/VICODIN) 5-325 MG per tablet Take 2 tablets by mouth every 12 (twelve) hours as needed for pain. 09/02/12   Dickie La, MD  HYDROcodone-acetaminophen (NORCO/VICODIN) 5-325 MG per tablet Take 1 tablet by mouth every 4 (four) hours as needed. 02/24/13   Janne Napoleon, NP  HYDROcodone-acetaminophen (VICODIN ES) 7.5-750 MG per tablet Take 1 tablet by mouth every 6 (six) hours as needed for pain. 12/14/12   Harden Mo, MD  ipratropium (ATROVENT) 0.06 % nasal spray Place 2 sprays into both nostrils 4 (four) times daily. As needed for nasal congestion 08/05/13   Lahoma Rocker, PA  ketorolac (TORADOL) 10 MG tablet Take 1 tablet (10 mg total) by mouth every 6 (six) hours as needed for pain (max 40mg  per day). 08/19/12   Jerene Pitch, MD  meloxicam (MOBIC) 15 MG tablet Take 1 tablet (15 mg total) by mouth daily. 12/14/12   Harden Mo, MD  metroNIDAZOLE (FLAGYL) 500 MG tablet Take 1 tablet (500 mg total) by mouth 3 (three) times daily. 12/14/12   Harden Mo, MD  nabumetone (RELAFEN) 750 MG tablet Take 1 tablet (750 mg total) by mouth 2 (two) times daily. 07/08/12   Lyndal Pulley, DO  penicillin v potassium (VEETID) 500 MG tablet Take 1 tablet (500 mg total) by mouth 3 (three) times daily. 11/07/12   Freeman Caldron Baker, PA-C  pravastatin (PRAVACHOL) 40 MG tablet Take 1 tablet (40 mg total) by mouth daily. 04/07/12   Pedro Earls, MD  traMADol (ULTRAM) 50 MG tablet Take 1 tablet (50 mg total) by mouth 2 (two) times daily as needed for pain. 09/02/12   Dickie La, MD   BP 134/86  Temp(Src) 98.4 F (36.9 C) (Oral)  Resp 18  SpO2 95% Physical Exam  Nursing note and vitals reviewed. Constitutional: She is oriented to person, place, and time. She  appears well-developed and well-nourished. No distress.  HENT:  Head: Normocephalic and atraumatic.  Right Ear: Hearing, tympanic membrane, external ear and ear canal normal.  Left Ear: Hearing, tympanic membrane, external ear and ear canal normal.  Nose: Rhinorrhea present.  Mouth/Throat: Uvula is midline, oropharynx is clear and moist and mucous membranes are normal. No oral lesions. No trismus in the jaw. No uvula swelling.  +Post nasal drainage No tenderness of sinuses with percussion  Eyes: Conjunctivae are normal. No scleral icterus.  Cardiovascular: Normal rate, regular rhythm and normal heart sounds.   Pulmonary/Chest: Effort normal and breath sounds normal.  Musculoskeletal: Normal range of motion.  Neurological: She is alert and oriented to person, place, and time.  Skin:  Skin is warm and dry. No rash noted. No erythema.  Psychiatric: She has a normal mood and affect. Her behavior is normal.    ED Course  Procedures (including critical care time) Labs Review Labs Reviewed - No data to display  Imaging Review No results found.   MDM   1. Nasal congestion    Atrovent as prescribed. No clinical evidence of acute bacterial sinusitis. May return to work. Follow up with PCP if no improvement.     [place], [place] 08/05/13 1256

## 2013-08-06 NOTE — ED Provider Notes (Signed)
Medical screening examination/treatment/procedure(s) were performed by a resident physician or non-physician practitioner and as the supervising physician I was immediately available for consultation/collaboration.  Evan Corey, MD    Evan S Corey, MD 08/06/13 0735 

## 2013-08-16 ENCOUNTER — Ambulatory Visit
Admission: RE | Admit: 2013-08-16 | Discharge: 2013-08-16 | Disposition: A | Discharge status: Home / Self Care | Payer: No Typology Code available for payment source | Source: Ambulatory Visit | Attending: Sports Medicine | Admitting: Sports Medicine

## 2013-08-16 VITALS — BP 150/95 | HR 62

## 2013-08-16 DIAGNOSIS — M48061 Spinal stenosis, lumbar region without neurogenic claudication: Secondary | ICD-10-CM

## 2013-08-16 DIAGNOSIS — M549 Dorsalgia, unspecified: Secondary | ICD-10-CM

## 2013-08-16 MED ORDER — METHYLPREDNISOLONE ACETATE 40 MG/ML INJ SUSP (RADIOLOG
120.0000 mg | Freq: Once | INTRAMUSCULAR | Status: AC
  Administered 2013-08-16: 120 mg via EPIDURAL

## 2013-08-16 MED ORDER — IOHEXOL 180 MG/ML  SOLN
1.0000 mL | Freq: Once | INTRAMUSCULAR | Status: AC | PRN
  Administered 2013-08-16: 1 mL via EPIDURAL

## 2013-08-16 NOTE — Discharge Instructions (Signed)

## 2013-08-26 ENCOUNTER — Ambulatory Visit: Payer: No Typology Code available for payment source | Admitting: Internal Medicine

## 2013-09-09 ENCOUNTER — Ambulatory Visit: Payer: Self-pay | Admitting: Sports Medicine

## 2013-09-13 ENCOUNTER — Ambulatory Visit (INDEPENDENT_AMBULATORY_CARE_PROVIDER_SITE_OTHER): Payer: Self-pay | Admitting: Sports Medicine

## 2013-09-13 ENCOUNTER — Encounter: Payer: Self-pay | Admitting: Sports Medicine

## 2013-09-13 VITALS — BP 151/88 | Ht 65.0 in | Wt 180.0 lb

## 2013-09-13 DIAGNOSIS — M48061 Spinal stenosis, lumbar region without neurogenic claudication: Secondary | ICD-10-CM

## 2013-09-13 DIAGNOSIS — M5416 Radiculopathy, lumbar region: Secondary | ICD-10-CM

## 2013-09-13 DIAGNOSIS — M25562 Pain in left knee: Secondary | ICD-10-CM

## 2013-09-13 DIAGNOSIS — IMO0002 Reserved for concepts with insufficient information to code with codable children: Secondary | ICD-10-CM

## 2013-09-13 DIAGNOSIS — M25569 Pain in unspecified knee: Secondary | ICD-10-CM

## 2013-09-13 MED ORDER — METHYLPREDNISOLONE ACETATE 40 MG/ML IJ SUSP
40.0000 mg | Freq: Once | INTRAMUSCULAR | Status: AC
  Administered 2013-09-13: 40 mg via INTRA_ARTICULAR

## 2013-09-13 MED ORDER — HYDROCODONE-ACETAMINOPHEN 5-325 MG PO TABS: ORAL_TABLET | ORAL | Status: DC

## 2013-09-13 NOTE — Progress Notes (Signed)
   Subjective:    [name], female    DOB: 1950/10/05, 63 y.o.   MRN: [id number]  HPI Patient presents today with returning low back pain and left knee pain. She had a history of severe lumbar spinal stenosis. She is experiencing pain in her lumbar spine as well as pain down the right leg. She is experiencing spasm in the right leg as well. She has undergone 2 previous lumbar ESIs each with good but temporary symptom relief. Pain is identical to what she has experienced previously. She was given a prescription previously by her PCP for hydrocodone but her "orange card" has expired and she is having to reapply for it. She is also complaining of returning left knee pain and swelling. An MRI of her left knee done a year ago showed a large radial tear through the posterior horn of the medial meniscus with moderate underlying tricompartmental DJD. She denies any recent trauma. She's done well with cortisone injections in this knee in the past.  Interim medical history reviewed    Review of Systems     Objective:   Physical Exam Well-developed, well-nourished. Some mild distressed due to her low back pain.  Positive straight leg raise on the right.  Left knee: Range of motion 0-100. Trace effusion. Tender to palpation along the medial joint line with a positive McMurray's. Neurovascularly intact distally. Walking with a slight limp.       Assessment & Plan:  1. Returning low back pain with right leg radiculopathy to severe spinal stenosis 2. Returning left knee pain and swelling secondary to large medial meniscal tear  Patient's left knee is injected with cortisone today. An anterior medial approach was utilized. Patient tolerated this without difficulty. I will refer her to [facility] imaging for her third lumbar ESI. Definitive treatment for both her lumbar spine and her left knee are surgical but she is currently without insurance. I have agreed to refill her  hydrocodone 5/325 and I've given her 20 pills with no refills. She understands that this is a one time refill. Future refills will need to come from her PCP. Followup with me prn.  Consent obtained and verified. Time-out conducted. Noted no overlying erythema, induration, or other signs of local infection. Skin prepped in a sterile fashion. Topical analgesic spray: Ethyl chloride. Joint: left knee Needle: 25g 1.5 inch Completed without difficulty. Meds: 3cc 1% xylocaine, 1cc (40mg ) depomedrol  Advised to call if fevers/chills, erythema, induration, drainage, or persistent bleeding.

## 2013-09-14 ENCOUNTER — Other Ambulatory Visit: Payer: Self-pay | Admitting: Sports Medicine

## 2013-09-14 DIAGNOSIS — M545 Low back pain: Secondary | ICD-10-CM

## 2013-09-28 ENCOUNTER — Ambulatory Visit
Admission: RE | Admit: 2013-09-28 | Discharge: 2013-09-28 | Disposition: A | Discharge status: Home / Self Care | Payer: No Typology Code available for payment source | Source: Ambulatory Visit | Attending: Sports Medicine | Admitting: Sports Medicine

## 2013-09-28 VITALS — BP 140/90 | HR 62

## 2013-09-28 DIAGNOSIS — M545 Low back pain: Secondary | ICD-10-CM

## 2013-09-28 MED ORDER — IOHEXOL 180 MG/ML  SOLN
1.0000 mL | Freq: Once | INTRAMUSCULAR | Status: AC | PRN
  Administered 2013-09-28: 1 mL via EPIDURAL

## 2013-09-28 MED ORDER — METHYLPREDNISOLONE ACETATE 40 MG/ML INJ SUSP (RADIOLOG
120.0000 mg | Freq: Once | INTRAMUSCULAR | Status: AC
  Administered 2013-09-28: 120 mg via EPIDURAL

## 2013-09-29 ENCOUNTER — Telehealth: Payer: Self-pay | Admitting: *Deleted

## 2013-09-29 ENCOUNTER — Other Ambulatory Visit: Payer: Self-pay | Admitting: Internal Medicine

## 2013-09-29 NOTE — Telephone Encounter (Signed)
Pt called had epidural recently for back pain. Next injection is Dec. On feet all  day long at work -  May take early retirement - suggest to talk with social worker at appt - already sch 09/30/13 in AM. Discuss something for pain for back. Hilda Blades Eleno Weimar RN 09/29/13 4:20PM

## 2013-09-30 ENCOUNTER — Encounter: Payer: Self-pay | Admitting: Internal Medicine

## 2013-09-30 ENCOUNTER — Ambulatory Visit (INDEPENDENT_AMBULATORY_CARE_PROVIDER_SITE_OTHER): Payer: Self-pay | Admitting: Internal Medicine

## 2013-09-30 VITALS — BP 120/81 | HR 73 | Temp 98.5°F | Ht 65.0 in | Wt 186.3 lb

## 2013-09-30 DIAGNOSIS — F32A Depression, unspecified: Secondary | ICD-10-CM

## 2013-09-30 DIAGNOSIS — F3289 Other specified depressive episodes: Secondary | ICD-10-CM

## 2013-09-30 DIAGNOSIS — F1410 Cocaine abuse, uncomplicated: Secondary | ICD-10-CM

## 2013-09-30 DIAGNOSIS — K029 Dental caries, unspecified: Secondary | ICD-10-CM

## 2013-09-30 DIAGNOSIS — M48061 Spinal stenosis, lumbar region without neurogenic claudication: Secondary | ICD-10-CM

## 2013-09-30 DIAGNOSIS — F329 Major depressive disorder, single episode, unspecified: Secondary | ICD-10-CM

## 2013-09-30 MED ORDER — ACETAMINOPHEN-CODEINE #2 300-15 MG PO TABS: 1.0000 | ORAL_TABLET | Freq: Four times a day (QID) | ORAL | Status: DC | PRN

## 2013-09-30 NOTE — Assessment & Plan Note (Signed)
-  pt reports she will go to a dental clinic at the end of the month

## 2013-09-30 NOTE — Assessment & Plan Note (Addendum)
Pt requesting pain medication for multilevel spinal stenosis greater at L4-5.  She received norco from Dr. Micheline Chapman on 8/3 for #20.   However, she was told to take 1 every 8 hours and she was taking 2 which is misuse.  I discussed the importance of her taking meds appropriately and as directed.  She really needs surgery but has to postpone it until the beginning of the year for family issues she claims.  I told her that referral to a pain clinic and surgery is most appropriate in her situation but we are hampered by the fact that her orange card has expired (in July).  I urged her to get this ASAP and she states she will come back next week for this.  On exam, she has full ROM of her spine but is painful.  Reflexes/sensation intact.  Has significant lumbar paraspinal tenderness mainly on the right side.  +SLR on the right.  Gait is normal.  She is still able to work 6-7 days a week at [employer].  Importantly she denies any recreational drug use ever but UDS was positive for benzos and cocaine previously (2013).  Also sees a Licensed conveyancer at [facility].  During our conversation she became very agitated and defensive stating that she is not a drug addict.  However, this is strange because I never mentioned anything regarding this.  I was trying to assess her level of pain and functioning.  When asked if NSAIDS or any other meds work she responded "no", I've tried that and it doesn't work.  I see many red flags in this patient and do not feel she is appropriate for long-term opioids from this clinic.  I have discussed that we can refer her to a pain clinic and she is agreeable.  I also stressed the importance of surgery.   -tylenol #2 on 8/20 for #20 with no refills (only provided this based on diagnosis and the fact that we cannot get her into a pain clinic without the orange card)  -referral made to pain clinic (pending orange card completion which she states she will have next week) -needs surgery  referral  -consider PT -please see FYI  -panel 15  -return to clinic to see PCP in 4-6 weeks   ADDENDUM: 09/30/13 UDS +cocaine

## 2013-09-30 NOTE — Assessment & Plan Note (Signed)
Advised to follow up with [facility]--reports she sees a counselor although she can't remember the name once a month.  She became tearful during the exam.  States she is not depressed but is on seroquel and prozac.   -follow up with [facility] -continue meds

## 2013-09-30 NOTE — Patient Instructions (Signed)
Thank you for your visit today.  Please return to the internal medicine clinic in 4-6 weeks to see your PCP.    Please take your medicine only as prescribed.   Please be sure to bring all of your medications with you to every visit; this includes herbal supplements, vitamins, eye drops, and any over-the-counter medications.   Should you have any questions regarding your medications and/or any new or worsening symptoms, please be sure to call the clinic at [phone].   If you believe that you are suffering from a life threatening condition or one that may result in the loss of limb or function, then you should call 911 or proceed to the nearest Emergency Department.     A healthy lifestyle and preventative care can promote health and wellness.   Maintain regular health, dental, and eye exams.  Eat a healthy diet. Foods like vegetables, fruits, whole grains, low-fat dairy products, and lean protein foods contain the nutrients you need without too many calories. Decrease your intake of foods high in solid fats, added sugars, and salt. Get information about a proper diet from your caregiver, if necessary.  Regular physical exercise is one of the most important things you can do for your health. Most adults should get at least 150 minutes of moderate-intensity exercise (any activity that increases your heart rate and causes you to sweat) each week. In addition, most adults need muscle-strengthening exercises on 2 or more days a week.   Maintain a healthy weight. The body mass index (BMI) is a screening tool to identify possible weight problems. It provides an estimate of body fat based on height and weight. Your caregiver can help determine your BMI, and can help you achieve or maintain a healthy weight. For adults 20 years and older:  A BMI below 18.5 is considered underweight.  A BMI of 18.5 to 24.9 is normal.  A BMI of 25 to 29.9 is considered overweight.  A BMI of 30 and above is  considered obese.

## 2013-09-30 NOTE — Progress Notes (Signed)
[name], female   DOB: [birth year], 63 y.o.   MRN: [id number]    Subjective:   [name] female    DOB: 1950-09-22 63 y.o.    MRN: [id number] Health Maintenance Due: Health Maintenance Due  Topic Date Due  . Pap Smear  11/15/2011  . Zostavax  12/23/2011  . Mammogram  01/10/2013  . Influenza Vaccine  09/11/2013    _________________________________________________  HPI: [name] is a 63 y.o. female here for an acute visit for pain.  Pt has a PMH outlined below.  Please see problem-based charting assessment and plan note for further details of medical issues addressed at today's visit.  PMH: Past Medical History  Diagnosis Date  . Hyperlipidemia   . Hypertension   . GERD (gastroesophageal reflux disease)   . Depression   . Right knee pain     posterior horn medial meniscal tear MRI 2013  . Lumbar radiculopathy, chronic   . Cervicalgia     Medications: Current Outpatient Prescriptions on File Prior to Visit  Medication Sig Dispense Refill  . albuterol (PROVENTIL HFA;VENTOLIN HFA) 108 (90 BASE) MCG/ACT inhaler Inhale 1-2 puffs into the lungs every 6 (six) hours as needed for wheezing or shortness of breath.  1 Inhaler  0  . chlorpheniramine-HYDROcodone (TUSSIONEX PENNKINETIC ER) 10-8 MG/5ML LQCR Take 5 mLs by mouth every 12 (twelve) hours as needed for cough.  115 mL  0  . cyclobenzaprine (FLEXERIL) 10 MG tablet Take 1 tablet (10 mg total) by mouth at bedtime as needed for muscle spasms.  30 tablet  0  . cyclobenzaprine (FLEXERIL) 10 MG tablet Take 1 tablet (10 mg total) by mouth at bedtime as needed for muscle spasms.  30 tablet  0  . FLUoxetine (PROZAC) 20 MG capsule Take 20 mg by mouth daily.        . fluticasone (FLONASE) 50 MCG/ACT nasal spray Place 1 spray into both nostrils 2 (two) times daily.  1 g  2  . gabapentin (NEURONTIN) 400 MG capsule Take 400 mg by mouth. 1 capsule in am and 2 capsule in pm.      .  HYDROcodone-acetaminophen (NORCO/VICODIN) 5-325 MG per tablet Take 1 tablet every 8 hours as needed  20 tablet  0  . ipratropium (ATROVENT) 0.06 % nasal spray Place 2 sprays into both nostrils 4 (four) times daily. As needed for nasal congestion  15 mL  1  . lisinopril-hydrochlorothiazide (PRINZIDE,ZESTORETIC) 20-12.5 MG per tablet Take 1 tablet by mouth daily.  90 tablet  4  . Olopatadine HCl 0.2 % SOLN Apply 1 drop to eye 2 (two) times daily. To both eyes  1 Bottle  0  . pravastatin (PRAVACHOL) 40 MG tablet Take 1 tablet (40 mg total) by mouth daily.  30 tablet  5   No current facility-administered medications on file prior to visit.    Allergies: No Known Allergies  FH: Family History  Problem Relation Age of Onset  . Hypertension Mother     SH: History   Social History  . Marital Status: Divorced    Spouse Name: N/A    Number of Children: N/A  . Years of Education: N/A   Social History Main Topics  . Smoking status: Former Research scientist (life sciences)  . Smokeless tobacco: Never Used  . Alcohol Use: Yes     Comment: socially  . Drug Use: No  . Sexual Activity: Not Currently   Other Topics Concern  . None  Social History Narrative   Single. 9 siblings. 1 brother with HIV, 1 sister with hypertension. 4 children, 1 daughter with HTN and depression, 1 son with HTN.   [phone]- shelter's number.    Former smoker, no alcohol or drug use.    Review of Systems: Constitutional: Negative for fever, chills and weight loss.  Eyes: Negative for blurred vision.  Respiratory: Negative for cough and shortness of breath.  Cardiovascular: Negative for chest pain, palpitations and leg swelling.  Gastrointestinal: Negative for nausea, vomiting, abdominal pain, diarrhea, constipation and blood in stool.  Genitourinary: Negative for dysuria, urgency and frequency.  Musculoskeletal: Negative for myalgias and +back pain.  Neurological: Negative for dizziness, weakness and headaches.     Objective:    Vital Signs: Filed Vitals:   09/30/13 0925  BP: 120/81  Pulse: 73  Temp: 98.5 F (36.9 C)  TempSrc: Oral  Height: 5\' 5"  (1.651 m)  Weight: 186 lb 4.8 oz (84.505 kg)  SpO2: 100%      BP Readings from Last 3 Encounters:  09/30/13 120/81  09/28/13 140/90  09/13/13 151/88    Physical Exam: Constitutional: Vital signs reviewed.  Patient is well-developed and well-nourished in NAD and cooperative with exam.  Head: Normocephalic and atraumatic. Eyes: PERRL, EOMI, conjunctivae nl, no scleral icterus.  Neck: Supple. Cardiovascular: RRR, no MRG. Pulmonary/Chest: normal effort, non-tender to palpation, CTAB, no wheezes, rales, or rhonchi. Abdominal: Soft. NT/ND +BS. Musculoskeletal: Full range ofmotion with pain, +SLR right, lumbar paraspinal tenderness. Patellar reflexes 2+ b/l.  Strength 5/5 UE and LE.  Sensation intact.   Nocyanosis,clubbing,oredema. Neurological: A&O x3, cranial nerves II-XII are grossly intact, moving all extremities. Extremities: 2+DP b/l; no pitting edema. Skin: Warm, dry and intact. No rash.   Assessment & Plan:   Assessment and plan was discussed and formulated with my attending.

## 2013-10-01 DIAGNOSIS — F1410 Cocaine abuse, uncomplicated: Secondary | ICD-10-CM | POA: Insufficient documentation

## 2013-10-01 LAB — PRESCRIPTION ABUSE MONITORING 15P, URINE
Amphetamine/Meth: NEGATIVE ng/mL
BARBITURATE SCREEN, URINE: NEGATIVE ng/mL
BENZODIAZEPINE SCREEN, URINE: NEGATIVE ng/mL
BUPRENORPHINE, URINE: NEGATIVE ng/mL
CANNABINOID SCRN UR: NEGATIVE ng/mL
CARISOPRODOL, URINE: NEGATIVE ng/mL
Creatinine, Urine: 519.24 mg/dL (ref >20.0)
Fentanyl, Ur: NEGATIVE ng/mL
MEPERIDINE UR: NEGATIVE ng/mL
Methadone Screen, Urine: NEGATIVE ng/mL
Oxycodone Screen, Ur: NEGATIVE ng/mL
Propoxyphene: NEGATIVE ng/mL
TRAMADOL UR: NEGATIVE ng/mL
ZOLPIDEM, URINE: NEGATIVE ng/mL

## 2013-10-01 LAB — VITAMIN D 25 HYDROXY (VIT D DEFICIENCY, FRACTURES): Vit D, 25-Hydroxy: 26 ng/mL — ABNORMAL LOW (ref 30–89)

## 2013-10-01 NOTE — Assessment & Plan Note (Signed)
ADDENDUM: 09/30/13 UDS +cocaine

## 2013-10-04 LAB — OPIATES/OPIOIDS (LC/MS-MS)
Codeine Urine: NEGATIVE ng/mL (ref <50)
Hydrocodone: 147 ng/mL — ABNORMAL HIGH (ref <50)
Hydromorphone: NEGATIVE ng/mL (ref <50)
MORPHINE: NEGATIVE ng/mL (ref <50)
NORHYDROCODONE, UR: 449 ng/mL — AB (ref <50)
NOROXYCODONE, UR: NEGATIVE ng/mL (ref <50)
OXYMORPHONE, URINE: NEGATIVE ng/mL (ref <50)
Oxycodone, ur: NEGATIVE ng/mL (ref <50)

## 2013-10-04 LAB — COCAINE METABOLITE (GC/LC/MS), URINE: BENZOYLECGONINE GC/MS CONF: 1456 ng/mL — AB (ref <100)

## 2013-10-05 NOTE — Progress Notes (Signed)
Internal Medicine Clinic Attending Date of visit: 10/05/2013   Case discussed with Dr. Gordy Levan soon after the resident saw the patient.  We reviewed the resident's history and exam and pertinent patient test results.  I agree with the assessment, diagnosis, and plan of care documented in the resident's note.

## 2013-11-02 ENCOUNTER — Other Ambulatory Visit: Payer: Self-pay | Admitting: *Deleted

## 2013-11-02 DIAGNOSIS — M48061 Spinal stenosis, lumbar region without neurogenic claudication: Secondary | ICD-10-CM

## 2013-11-05 ENCOUNTER — Ambulatory Visit: Payer: Self-pay

## 2013-11-05 MED ORDER — CYCLOBENZAPRINE HCL 10 MG PO TABS: 10.0000 mg | ORAL_TABLET | Freq: Every evening | ORAL | Status: DC | PRN

## 2013-11-05 NOTE — Telephone Encounter (Signed)
Talked with Dr Ronnald Ramp about both Rx 11/05/13 1:30PM. Left message on [phone] per pt about meds - Dr Ronnald Ramp states he will renew Flexeril but on the pain med pt needs to make appt in clinic to be checked. Dr Ronnald Ramp states he has never seen pt. Front desk pool aware - needs appt about pain med. Hilda Blades Eliyah Bazzi RN 11/05/13 1:40PM

## 2013-11-05 NOTE — Telephone Encounter (Signed)
Rx called in to [facility] pt request instead [place] [facility]. Hilda Blades Phuc Kluttz RN 11/05/13 2:40PM

## 2013-11-16 ENCOUNTER — Encounter: Payer: Self-pay | Admitting: Internal Medicine

## 2013-11-25 ENCOUNTER — Ambulatory Visit (INDEPENDENT_AMBULATORY_CARE_PROVIDER_SITE_OTHER): Payer: Self-pay | Admitting: Internal Medicine

## 2013-11-25 ENCOUNTER — Encounter: Payer: Self-pay | Admitting: Internal Medicine

## 2013-11-25 VITALS — BP 145/98 | HR 76 | Temp 98.3°F | Resp 20 | Ht 63.5 in | Wt 190.9 lb

## 2013-11-25 DIAGNOSIS — Z1231 Encounter for screening mammogram for malignant neoplasm of breast: Secondary | ICD-10-CM

## 2013-11-25 DIAGNOSIS — Z0000 Encounter for general adult medical examination without abnormal findings: Secondary | ICD-10-CM

## 2013-11-25 DIAGNOSIS — M48061 Spinal stenosis, lumbar region without neurogenic claudication: Secondary | ICD-10-CM

## 2013-11-25 DIAGNOSIS — E559 Vitamin D deficiency, unspecified: Secondary | ICD-10-CM

## 2013-11-25 DIAGNOSIS — I10 Essential (primary) hypertension: Secondary | ICD-10-CM

## 2013-11-25 DIAGNOSIS — D649 Anemia, unspecified: Secondary | ICD-10-CM

## 2013-11-25 DIAGNOSIS — E785 Hyperlipidemia, unspecified: Secondary | ICD-10-CM

## 2013-11-25 DIAGNOSIS — M4806 Spinal stenosis, lumbar region: Secondary | ICD-10-CM

## 2013-11-25 MED ORDER — CYCLOBENZAPRINE HCL 10 MG PO TABS: 15.0000 mg | ORAL_TABLET | Freq: Two times a day (BID) | ORAL | Status: DC | PRN

## 2013-11-25 MED ORDER — CHOLECALCIFEROL 25 MCG (1000 UT) PO CHEW: 1.0000 | CHEWABLE_TABLET | Freq: Every day | ORAL | Status: DC

## 2013-11-25 MED ORDER — PRAVASTATIN SODIUM 40 MG PO TABS: 40.0000 mg | ORAL_TABLET | Freq: Every day | ORAL | Status: DC

## 2013-11-25 MED ORDER — GABAPENTIN 300 MG PO CAPS: 300.0000 mg | ORAL_CAPSULE | Freq: Three times a day (TID) | ORAL | Status: DC

## 2013-11-25 MED ORDER — KETOROLAC TROMETHAMINE 60 MG/2ML IM SOLN
60.0000 mg | Freq: Once | INTRAMUSCULAR | Status: AC
  Administered 2013-11-25: 60 mg via INTRAMUSCULAR

## 2013-11-25 MED ORDER — ACETAMINOPHEN-CODEINE #2 300-15 MG PO TABS: 1.0000 | ORAL_TABLET | Freq: Four times a day (QID) | ORAL | Status: DC | PRN

## 2013-11-25 NOTE — Patient Instructions (Addendum)
-  Take flexiril 15 mg twice a day as needed for muscle spasms -I have refilled your tylenol #2, take it every 6 hrs as needed for pain -Please make an appt with Dr. Ronnald Ramp for Tuesday at 10:15 AM for a pap smear -Will call you with appt for mammogram  -Will check your bloodwork at next visit   General Instructions:   Thank you for bringing your medicines today. This helps [name] keep you safe from mistakes.   Progress Toward Treatment Goals:  Treatment Goal 08/19/2012  Blood pressure at goal    Self Care Goals & Plans:  Self Care Goal 11/25/2013  Manage my medications take my medicines as prescribed; bring my medications to every visit  Monitor my health -  Eat healthy foods eat baked foods instead of fried foods  Be physically active take a walk every day    No flowsheet data found.   Care Management & Community Referrals:  Referral 11/25/2013  Referrals made to community resources exercise/physical therapy

## 2013-11-25 NOTE — Assessment & Plan Note (Addendum)
-  Pt due for pap smear which she is to schedule with her PCP  -Order placed for screening mammography (last one Nov 2012)

## 2013-11-25 NOTE — Assessment & Plan Note (Signed)
Assessment: Pt with last lipid panel on 04/06/12 with hypercholesteremia and hypertriglyceridemia  non-compliant with statin therapy with 10-yr ASCVD risk of 15.2 % with recommendations to continue moderate to high intensity statin therapy.   Plan:  -Obtain annual lipid panel and CMP at next visit, declined today -Refill pravastatin 40 mg daily  -Monitor for myalgias

## 2013-11-25 NOTE — Assessment & Plan Note (Signed)
Assessment: Pt with chronic normocytic anemia with history of menorrhagia and benign polyp on last colonoscopy in 2012 with last ferritin level of 32 and baseline Hg 11 who presents with no active bleeding or hemodynamic instability.   Plan:  -Last CBC 07/23/11, repeat at next visit (declined testing today) -Obtain anemia panel at next visit

## 2013-11-25 NOTE — Assessment & Plan Note (Signed)
Assessment: Pt with moderate to well-controlled hypertension compliant with two-class (ACEi & diuretic) anti-hypertensive therapy who presents with blood pressure of 145/98 in setting of uncontrolled pain.   Plan:  -BP 145/98 near goal <140/90 in setting of uncontrolled pain -Continue lisinopril-HCTZ 20-12.5 mg daily  -Last CMP on 04/06/12, obtain at next visit, pt declined testing today

## 2013-11-25 NOTE — Assessment & Plan Note (Addendum)
Assessment: Pt with vitamin D level of 26 on 09/30/13 with normal DEXA scan in 2012 who presents with no recent fall or fracture.   Plan:  -Start cholecalciferol 1000U daily

## 2013-11-25 NOTE — Assessment & Plan Note (Addendum)
Assessment: Pt with congenital and acquired multilevel spinal stenosis (last MRI in March 2015) worse at L4-5 with right sided sciatica s/p three epidural corticosteroid injections in the past year who presents with uncontrolled pain (ongoing for past year) with current medical therapy.   Plan:  -Administer IM ketorolac 60 mg once today, pt called later reporting significant pain relief -Change flexeril from 10 mg TID to 15 mg BID PRN muscle spasms  -Refill acetaminophen-codeine (tylenol #2) Q 6 hr PRN pain  -Continue gabapentin 300 mg TID for paraesthesias and neuropathic pain  -Pt needs neurosurgery referral after her orange card is approved -Pt to follow-up with Dr. Micheline Chapman for 4th epidural corticosteroid injection in December 2015 -Consider repeating UDS at next visit

## 2013-11-25 NOTE — Progress Notes (Signed)
[name], female   DOB: 1950/12/15, 63 y.o.   MRN: [id number]    Subjective:   [name] female   DOB: 1950/10/12 63 y.o.   MRN: [id number]  HPI: [name] is a 63 y.o. pleasant woman with past medical history of hypertension, hyperlipidemia, chronic low back pain due to spinal stenosis, vitamin D insufficiency, depression, and chronic normocytic anemia who presents with chief complaint of uncontrolled back pain.   She was last seen in clinic in August where she was prescribed tylenol #2 to take every 6 hrs which she reports she has been taking 6 daily to control her pain. She has also increased her flexeril usage and takes 2-4 daily. It does not make her drowsy and she is able to work drive-thru window at [employer]. She also takes gabapentin 300 mg TID which helps with tingling and burning pain. 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 She is agreeable to having a repeat UDS at next visit. She reports that norco helped her in the past. She continues to have right sided sciatica with mild weakness in her right LE that is no worse than her baseline. She denies fever, chills, weight loss, or bladder/bowel incontinence. She also denies recent fall, injury, or trauma. She is able to ambulate without difficulty. She reports that she was unable to renew her orange card due to a form from IRS that she has not been able to obtain. She does not have money to see pain clinic that was referred at last visit. She is due to see Dr. Micheline Chapman for a fourth epidural corticosteroid injection in December (reports she is unable to have it done earlier).       She is compliant with taking lisinopril-HCTZ daily for hypertension. She reports lightheadedness but denies headache, blurry vision, headache, or LE edema.   She has been not taking pravastatin for hyperlipidemia because she ran out of the medication and would like a  refill.   She has history of anemia and reports having menorrhagia in the past. She had a colonoscopy in 2011 which revealed benign polyps. She denies dark or bright red stools.   She would like to have a pap smear at next visit because she suspects her partner may have been unfaithful. She reports cervical discharge and odor without urinary symptoms.   She would like to be scheduled for a mammogram. She is agreeable to having blood work at next visit.    Past Medical History  Diagnosis Date  . Hyperlipidemia   . Hypertension   . GERD (gastroesophageal reflux disease)   . Depression   . Right knee pain     posterior horn medial meniscal tear MRI 2013  . Lumbar radiculopathy, chronic   . Cervicalgia    Current Outpatient Prescriptions  Medication Sig Dispense Refill  . acetaminophen-codeine (TYLENOL #2) 300-15 MG per tablet Take 1 tablet by mouth every 6 (six) hours as needed for moderate pain.  20 tablet  0  . albuterol (PROVENTIL HFA;VENTOLIN HFA) 108 (90 BASE) MCG/ACT inhaler Inhale 1-2 puffs into the lungs every 6 (six) hours as needed for wheezing or shortness of breath.  1 Inhaler  0  . cyclobenzaprine (FLEXERIL) 10 MG tablet Take 1 tablet (10 mg total) by mouth at bedtime as needed for muscle spasms.  30 tablet  0  . FLUoxetine (PROZAC) 20 MG capsule Take 20 mg by  mouth daily.        Marland Kitchen gabapentin (NEURONTIN) 400 MG capsule Take 400 mg by mouth. 1 capsule in am and 2 capsule in pm.      . HYDROcodone-acetaminophen (NORCO/VICODIN) 5-325 MG per tablet Take 1 tablet every 8 hours as needed  20 tablet  0  . ipratropium (ATROVENT) 0.06 % nasal spray Place 2 sprays into both nostrils 4 (four) times daily. As needed for nasal congestion  15 mL  1  . lisinopril-hydrochlorothiazide (PRINZIDE,ZESTORETIC) 20-12.5 MG per tablet Take 1 tablet by mouth daily.  90 tablet  4  . Olopatadine HCl 0.2 % SOLN Apply 1 drop to eye 2 (two) times daily. To both eyes  1 Bottle  0  . pravastatin (PRAVACHOL)  40 MG tablet Take 1 tablet (40 mg total) by mouth daily.  30 tablet  5   No current facility-administered medications for this visit.   Family History  Problem Relation Age of Onset  . Hypertension Mother    History   Social History  . Marital Status: Divorced    Spouse Name: N/A    Number of Children: N/A  . Years of Education: N/A   Social History Main Topics  . Smoking status: Former Research scientist (life sciences)  . Smokeless tobacco: Never Used  . Alcohol Use: Yes     Comment: socially  . Drug Use: No  . Sexual Activity: Not Currently   Other Topics Concern  . Not on file   Social History Narrative   Single. 9 siblings. 1 brother with HIV, 1 sister with hypertension. 4 children, 1 daughter with HTN and depression, 1 son with HTN.   [phone]- shelter's number.    Former smoker, no alcohol or drug use.   Review of Systems: Review of Systems  Constitutional: Negative for fever and chills.       Weight gain  Eyes: Negative for blurred vision.  Respiratory: Negative for cough, shortness of breath and wheezing.   Gastrointestinal: Negative for nausea, vomiting, abdominal pain, diarrhea, constipation and blood in stool.  Genitourinary: Negative for dysuria, urgency, frequency and hematuria.       Cervical discharge with odor  Musculoskeletal: Positive for back pain (chronic ). Negative for falls.  Neurological: Positive for dizziness (lightheadedness ) and sensory change (right sided sciatica ). Negative for headaches.  Psychiatric/Behavioral: Negative for depression.    Objective:  Physical Exam: Physical Exam  Constitutional: She is oriented to person, place, and time. She appears well-developed and well-nourished. No distress.  HENT:  Head: Normocephalic and atraumatic.  Eyes: EOM are normal.  Neck: Normal range of motion. Neck supple.  Cardiovascular: Normal rate, regular rhythm and normal heart sounds.   Pulmonary/Chest: Breath sounds normal. No respiratory distress. She has no  wheezes. She has no rales.  Abdominal: Soft. Bowel sounds are normal. She exhibits no distension. There is no tenderness. There is no rebound and no guarding.  Musculoskeletal: Normal range of motion. She exhibits no edema and no tenderness.  Positive right straight leg test  Neurological: She is alert and oriented to person, place, and time.  Normal sensation of extremities to light touch b/l. Normal LE reflexes.   Skin: Skin is warm and dry. No rash noted. She is not diaphoretic. No erythema. No pallor.  Psychiatric: She has a normal mood and affect. Her behavior is normal. Judgment and thought content normal.    Filed Vitals:   11/25/13 0827  BP: 145/98  Pulse: 76  Temp: 98.3 F (36.8 C)  TempSrc: Oral  Resp: 20  Height: 5' 3.5" (1.613 m)  Weight: 190 lb 14.4 oz (86.592 kg)  SpO2: 100%   Assessment & Plan:   Please see problem list for problem-based assessment and plan

## 2013-11-26 NOTE — Progress Notes (Signed)
Case discussed with Dr. Rabbani soon after the resident saw the patient.  We reviewed the resident's history and exam and pertinent patient test results.  I agree with the assessment, diagnosis and plan of care documented in the resident's note. 

## 2013-11-30 ENCOUNTER — Encounter: Payer: Self-pay | Admitting: Internal Medicine

## 2013-12-02 ENCOUNTER — Ambulatory Visit: Payer: Self-pay | Admitting: Internal Medicine

## 2013-12-03 ENCOUNTER — Encounter: Payer: Self-pay | Admitting: Internal Medicine

## 2013-12-27 ENCOUNTER — Telehealth: Payer: Self-pay | Admitting: *Deleted

## 2013-12-27 NOTE — Telephone Encounter (Signed)
Agree, thanks

## 2013-12-27 NOTE — Telephone Encounter (Signed)
Pt calls and states she needs to be seen today due to her work schedule, there is nothing available today, she states she is having a very heavy vaginal discharge for 1 week progressively becoming worse, she states it smells very bad and her panties are even sticking to her, she states she has a vaginal mesh and is concerned something is wrong with it, she is advised to be seen at [facility] maternity admissions as a gyn pt, she is agreeable and will call for f/u appt

## 2013-12-30 ENCOUNTER — Inpatient Hospital Stay (HOSPITAL_COMMUNITY): Payer: Self-pay

## 2013-12-30 ENCOUNTER — Encounter (HOSPITAL_COMMUNITY): Payer: Self-pay | Admitting: *Deleted

## 2013-12-30 ENCOUNTER — Inpatient Hospital Stay (HOSPITAL_COMMUNITY)
Admission: AD | Admit: 2013-12-30 | Discharge: 2013-12-30 | Disposition: A | Discharge status: Home / Self Care | Payer: Self-pay | Source: Ambulatory Visit | Attending: Obstetrics & Gynecology | Admitting: Obstetrics & Gynecology

## 2013-12-30 DIAGNOSIS — K5900 Constipation, unspecified: Secondary | ICD-10-CM

## 2013-12-30 DIAGNOSIS — N816 Rectocele: Secondary | ICD-10-CM

## 2013-12-30 DIAGNOSIS — R1030 Lower abdominal pain, unspecified: Secondary | ICD-10-CM

## 2013-12-30 DIAGNOSIS — Z9889 Other specified postprocedural states: Secondary | ICD-10-CM | POA: Insufficient documentation

## 2013-12-30 DIAGNOSIS — R109 Unspecified abdominal pain: Secondary | ICD-10-CM

## 2013-12-30 LAB — URINALYSIS, ROUTINE W REFLEX MICROSCOPIC
Bilirubin Urine: NEGATIVE
Glucose, UA: NEGATIVE mg/dL
HGB URINE DIPSTICK: NEGATIVE
KETONES UR: NEGATIVE mg/dL
Nitrite: NEGATIVE
PH: 6.5 (ref 5.0–8.0)
Protein, ur: NEGATIVE mg/dL
SPECIFIC GRAVITY, URINE: 1.02 (ref 1.005–1.030)
Urobilinogen, UA: 0.2 mg/dL (ref 0.0–1.0)

## 2013-12-30 LAB — WET PREP, GENITAL
CLUE CELLS WET PREP: NONE SEEN
TRICH WET PREP: NONE SEEN
Yeast Wet Prep HPF POC: NONE SEEN

## 2013-12-30 LAB — URINE MICROSCOPIC-ADD ON

## 2013-12-30 LAB — CBC
HCT: 34.7 % — ABNORMAL LOW (ref 36.0–46.0)
Hemoglobin: 10.9 g/dL — ABNORMAL LOW (ref 12.0–15.0)
MCH: 26.9 pg (ref 26.0–34.0)
MCHC: 31.4 g/dL (ref 30.0–36.0)
MCV: 85.7 fL (ref 78.0–100.0)
PLATELETS: 282 10*3/uL (ref 150–400)
RBC: 4.05 MIL/uL (ref 3.87–5.11)
RDW: 14 % (ref 11.5–15.5)
WBC: 5.9 10*3/uL (ref 4.0–10.5)

## 2013-12-30 LAB — RPR

## 2013-12-30 MED ORDER — KETOROLAC TROMETHAMINE 60 MG/2ML IM SOLN
60.0000 mg | Freq: Once | INTRAMUSCULAR | Status: AC
  Administered 2013-12-30: 60 mg via INTRAMUSCULAR
  Filled 2013-12-30: qty 2

## 2013-12-30 MED ORDER — IBUPROFEN 800 MG PO TABS: 800.0000 mg | ORAL_TABLET | Freq: Three times a day (TID) | ORAL | Status: DC

## 2013-12-30 NOTE — MAU Note (Signed)
Pt c/o pelvic pain. Has had a dull constant pain in the bottom of her stomach. Pain has been for the past week. Pt has a bladder mesh and afraid it might be coming from that.  Want STD testing (S.O. Cheated on her).

## 2013-12-30 NOTE — MAU Note (Signed)
Pelvic revealed prolapse of bowel, education about constipation , days since last BM

## 2013-12-30 NOTE — MAU Provider Note (Signed)
History     CSN: [id number]  Arrival date and time: 12/30/13 1326   None     Chief Complaint  Patient presents with  . Abdominal Pain   HPI pt is not pregnant and presents with lower abd pain and vaginal pressure. Pt has hx of mesh which has been doing well until now.  Pt's surgery done in 09/2016 by Dr. Burke Keels with hyst and BSO Pt has had this pain once before and had Toradol with relief of pain. Pt sees [facility] for PCP. Pt c/o of light brown vaginal discharge with odor. Pt started having lower abdominal pain. Pt found out partner of 5 years cheated on pt- pt is concerned about STDs Pt seen previously for this problem and was supposed to return for an ultrasound and did not follow up.   Pt has been seen in [facility]- telephone call from 12/27/2013: Pt calls and states she needs to be seen today due to her work schedule, there is nothing available today, she states she is having a very heavy vaginal discharge for 1 week progressively becoming worse, she states it smells very bad and her panties are even sticking to her, she states she has a vaginal mesh and is concerned something is wrong with it, she is advised to be seen at [facility] maternity admissions as a gyn pt, she is agreeable and will call for f/u appt       Past Medical History  Diagnosis Date  . Hyperlipidemia   . Hypertension   . GERD (gastroesophageal reflux disease)   . Depression   . Right knee pain     posterior horn medial meniscal tear MRI 2013  . Lumbar radiculopathy, chronic   . Cervicalgia     Past Surgical History  Procedure Laterality Date  . Hysterectomy other    . Bladder tack  10/2006    cystocoele  . Appendectomy      Family History  Problem Relation Age of Onset  . Hypertension Mother     History  Substance Use Topics  . Smoking status: Former Smoker    Quit date: 11/26/1983  . Smokeless tobacco: Never Used  . Alcohol Use: Yes     Comment: socially    Allergies:  No Known Allergies  Prescriptions prior to admission  Medication Sig Dispense Refill Last Dose  . acetaminophen-codeine (TYLENOL #2) 300-15 MG per tablet Take 1 tablet by mouth every 6 (six) hours as needed for moderate pain. 120 tablet 2   . albuterol (PROVENTIL HFA;VENTOLIN HFA) 108 (90 BASE) MCG/ACT inhaler Inhale 1-2 puffs into the lungs every 6 (six) hours as needed for wheezing or shortness of breath. 1 Inhaler 0 Past Month at Unknown time  . Cholecalciferol 1000 UNITS CHEW Chew 1 tablet (1,000 Units total) by mouth daily. 30 tablet 3   . cyclobenzaprine (FLEXERIL) 10 MG tablet Take 1.5 tablets (15 mg total) by mouth 2 (two) times daily as needed for muscle spasms. 60 tablet 2   . FLUoxetine (PROZAC) 20 MG capsule Take 20 mg by mouth daily.     08/05/2013 at Unknown time  . gabapentin (NEURONTIN) 300 MG capsule Take 1 capsule (300 mg total) by mouth 3 (three) times daily. 90 capsule 3   . ipratropium (ATROVENT) 0.06 % nasal spray Place 2 sprays into both nostrils 4 (four) times daily. As needed for nasal congestion 15 mL 1   . lisinopril-hydrochlorothiazide (PRINZIDE,ZESTORETIC) 20-12.5 MG per tablet Take 1 tablet by mouth daily.  90 tablet 4 08/05/2013 at Unknown time  . Olopatadine HCl 0.2 % SOLN Apply 1 drop to eye 2 (two) times daily. To both eyes 1 Bottle 0 08/05/2013 at Unknown time  . pravastatin (PRAVACHOL) 40 MG tablet Take 1 tablet (40 mg total) by mouth daily. 90 tablet 3     Review of Systems  Constitutional: Negative for fever and chills.  Gastrointestinal: Positive for abdominal pain and constipation. Negative for nausea, vomiting and diarrhea.   Physical Exam   Blood pressure 123/95, pulse 74, temperature 98.7 F (37.1 C), temperature source Oral, resp. rate 18, height 5\' 5"  (1.651 m), weight 182 lb (82.555 kg).  Physical Exam  Nursing note and vitals reviewed. Constitutional: She is oriented to person, place, and time. She appears well-developed and well-nourished. No  distress.  HENT:  Head: Normocephalic.  Eyes: Pupils are equal, round, and reactive to light.  Neck: Normal range of motion. Neck supple.  Cardiovascular: Normal rate.   Respiratory: Effort normal.  GI: Soft. She exhibits no distension. There is no tenderness. There is no rebound.  Genitourinary:  hypoestrogenic vaginal mucosa with small amount of white vaginal discharge in vault; sutures visible at cuff of vagina. Significant rectocele noted - 3rd degree to introitus. Bimanual mildly tender with palpation- no rebound  Musculoskeletal: Normal range of motion.  Neurological: She is alert and oriented to person, place, and time.  Skin: Skin is warm and dry.  Psychiatric: She has a normal mood and affect.    MAU Course  Procedures Toradol 60mg  IM given with significant relief of pain Results for orders placed or performed during the hospital encounter of 12/30/13 (from the past 24 hour(s))  Urinalysis, Routine w reflex microscopic     Status: Abnormal   Collection Time: 12/30/13  1:55 PM  Result Value Ref Range   Color, Urine YELLOW YELLOW   APPearance CLEAR CLEAR   Specific Gravity, Urine 1.020 1.005 - 1.030   pH 6.5 5.0 - 8.0   Glucose, UA NEGATIVE NEGATIVE mg/dL   Hgb urine dipstick NEGATIVE NEGATIVE   Bilirubin Urine NEGATIVE NEGATIVE   Ketones, ur NEGATIVE NEGATIVE mg/dL   Protein, ur NEGATIVE NEGATIVE mg/dL   Urobilinogen, UA 0.2 0.0 - 1.0 mg/dL   Nitrite NEGATIVE NEGATIVE   Leukocytes, UA LARGE (A) NEGATIVE  Urine microscopic-add on     Status: Abnormal   Collection Time: 12/30/13  1:55 PM  Result Value Ref Range   Squamous Epithelial / LPF MANY (A) RARE   WBC, UA 7-10 <3 WBC/hpf   RBC / HPF 0-2 <3 RBC/hpf   Bacteria, UA MANY (A) RARE   Urine-Other MUCOUS PRESENT   CBC     Status: Abnormal   Collection Time: 12/30/13  5:17 PM  Result Value Ref Range   WBC 5.9 4.0 - 10.5 K/uL   RBC 4.05 3.87 - 5.11 MIL/uL   Hemoglobin 10.9 (L) 12.0 - 15.0 g/dL   HCT 34.7 (L) 36.0  - 46.0 %   MCV 85.7 78.0 - 100.0 fL   MCH 26.9 26.0 - 34.0 pg   MCHC 31.4 30.0 - 36.0 g/dL   RDW 14.0 11.5 - 15.5 %   Platelets 282 150 - 400 K/uL  US Transvaginal Non-ob  12/30/2013   CLINICAL DATA:  Pelvic pain for 1 week. The uterus has been removed.  EXAM: TRANSABDOMINAL AND TRANSVAGINAL ULTRASOUND OF PELVIS  TECHNIQUE: Both transabdominal and transvaginal ultrasound examinations of the pelvis were performed. Transabdominal technique was performed for global imaging  of the pelvis including uterus, ovaries, adnexal regions, and pelvic cul-de-sac. It was necessary to proceed with endovaginal exam following the transabdominal exam to try to visualize ovaries.  COMPARISON:  None  FINDINGS: Uterus  Removed.  Right ovary  Not visualized.  Left ovary  Not visualized.  Other findings  No free fluid.  IMPRESSION: No visible abnormalities. The patient reports that the uterus has been removed. She is not certain whether she still has her ovaries.   Electronically Signed   By: Rozetta Nunnery M.D.   On: 12/30/2013 18:08   US Pelvis Complete  12/30/2013   CLINICAL DATA:  Pelvic pain for 1 week. The uterus has been removed.  EXAM: TRANSABDOMINAL AND TRANSVAGINAL ULTRASOUND OF PELVIS  TECHNIQUE: Both transabdominal and transvaginal ultrasound examinations of the pelvis were performed. Transabdominal technique was performed for global imaging of the pelvis including uterus, ovaries, adnexal regions, and pelvic cul-de-sac. It was necessary to proceed with endovaginal exam following the transabdominal exam to try to visualize ovaries.  COMPARISON:  None  FINDINGS: Uterus  Removed.  Right ovary  Not visualized.  Left ovary  Not visualized.  Other findings  No free fluid.  IMPRESSION: No visible abnormalities. The patient reports that the uterus has been removed. She is not certain whether she still has her ovaries.   Electronically Signed   By: Rozetta Nunnery M.D.   On: 12/30/2013 18:08  wet prep and GC/Chlamydia pending-  pt will call back for results of wet prep- specimen got delayed being sent to lab  Assessment and Plan  Pelvic pain s/p vaginal repair/cytocele repair with mesh by dr. Burke Keels- Rx Ibuprofen 800mg  every 8 hours as needed for pain- may alternate with tylenol Rectocele- follow up with Dr. Hulan Fray at [facility]- message sent to Ladoris Gene to contact pt with appointment Constipation UP to date pt education information given [facility] 12/30/2013, 3:27 PM

## 2013-12-31 ENCOUNTER — Ambulatory Visit (INDEPENDENT_AMBULATORY_CARE_PROVIDER_SITE_OTHER): Payer: Self-pay | Admitting: Family Medicine

## 2013-12-31 ENCOUNTER — Encounter: Payer: Self-pay | Admitting: Family Medicine

## 2013-12-31 VITALS — BP 129/86 | HR 92 | Ht 65.0 in | Wt 182.0 lb

## 2013-12-31 DIAGNOSIS — M1712 Unilateral primary osteoarthritis, left knee: Secondary | ICD-10-CM

## 2013-12-31 LAB — HIV ANTIBODY (ROUTINE TESTING W REFLEX): HIV 1&2 Ab, 4th Generation: NONREACTIVE

## 2013-12-31 LAB — GC/CHLAMYDIA PROBE AMP
CT PROBE, AMP APTIMA: NEGATIVE
GC Probe RNA: NEGATIVE

## 2013-12-31 MED ORDER — METHYLPREDNISOLONE ACETATE 40 MG/ML IJ SUSP
40.0000 mg | Freq: Once | INTRAMUSCULAR | Status: AC
  Administered 2013-12-31: 40 mg via INTRA_ARTICULAR

## 2014-01-01 ENCOUNTER — Encounter: Payer: Self-pay | Admitting: Family Medicine

## 2014-01-01 NOTE — Progress Notes (Signed)
   Subjective:    [name], female    DOB: 24-Jun-1950, 63 y.o.   MRN: [id number]  HPI Patient presents today with returning about her left knee pain.  She is complaining of returning left knee pain and swelling. An MRI of her left knee done 08/2012 showed a large radial tear through the posterior horn of the medial meniscus with moderate underlying tricompartmental DJD, and patellofemoral thinned and irregular. She denies any recent trauma. She's done well with cortisone injections in this knee in the past with her last injection done by Dr. Micheline Chapman in August with good clinical response.  She currently does not have the means or desire to pursue surgery. Currently pain started worsening in the past several 4-6 weeks worse at the end of the day working at [employer] on her feet for several hours. She previously had surgery of meniscectomy in the left knee many years ago. She has been treating pain advil and flexeril PRN.   Interim medical history reviewed  Review of Systems Otherwise negative except what is present in HPI    Objective:   Physical Exam KNEE EXAM:  General: well nourished Skin of LE: warm; dry, no rashes, lesions, ecchymosis or erythema. Vascular: Dorsal pedal pulses 2+ bilaterally Neurologically: Sensation to light touch lower extremities equal and intact bilaterally.  Observation: patella in place, no quad muscle atrophy Palpation:  Moderate evidence of knee effusion  Severe pain along the lateral joint line and medial joint line Range of motion:  Full knee flexion to approximately 140, full extension 0, normal patellar tracking Ligamentous testing: ligamentous stablity 5/5 strength in flex / ext  Negative patella apprehension test Meniscal evaluation: Positive McMurray's test, Positive thessaly's test, Normal gait     Assessment & Plan:  1.Returning left knee pain and swelling secondary to large medial meniscal tear  Patient's left knee is  injected with cortisone today. An anterior lateral approach was utilized. Patient tolerated this without difficulty. Recommend that treatment with steroid injection can be done every 3-6 months prn for pain control but surgical intervention will need to be considered if injections stop working.   Consent obtained and verified. Time-out conducted. Noted no overlying erythema, induration, or other signs of local infection. Skin prepped in a sterile fashion. Topical analgesic spray: Ethyl chloride. Joint: left knee Needle: 25g 1.5 inch Completed without difficulty. Meds: 3cc 1% xylocaine, 1cc (40mg ) depomedrol Advised to call if fevers/chills, erythema, induration, drainage, or persistent bleeding.

## 2014-01-14 ENCOUNTER — Telehealth: Payer: Self-pay | Admitting: General Practice

## 2014-01-14 NOTE — Telephone Encounter (Signed)
Patient called and left message stating she would like her test results.

## 2014-01-14 NOTE — Telephone Encounter (Signed)
Called patient and informed her of negative results. Patient verbalized understanding and had no other questions

## 2014-01-19 ENCOUNTER — Other Ambulatory Visit: Payer: Self-pay | Admitting: *Deleted

## 2014-01-20 ENCOUNTER — Other Ambulatory Visit: Payer: Self-pay | Admitting: *Deleted

## 2014-01-20 DIAGNOSIS — M5416 Radiculopathy, lumbar region: Secondary | ICD-10-CM

## 2014-01-20 MED ORDER — LISINOPRIL-HYDROCHLOROTHIAZIDE 20-12.5 MG PO TABS: 1.0000 | ORAL_TABLET | Freq: Every day | ORAL | Status: DC

## 2014-01-25 ENCOUNTER — Other Ambulatory Visit: Payer: Self-pay | Admitting: Sports Medicine

## 2014-01-25 DIAGNOSIS — M5416 Radiculopathy, lumbar region: Secondary | ICD-10-CM

## 2014-01-28 ENCOUNTER — Ambulatory Visit
Admission: RE | Admit: 2014-01-28 | Discharge: 2014-01-28 | Disposition: A | Discharge status: Home / Self Care | Payer: No Typology Code available for payment source | Source: Ambulatory Visit | Attending: Sports Medicine | Admitting: Sports Medicine

## 2014-01-28 DIAGNOSIS — M5416 Radiculopathy, lumbar region: Secondary | ICD-10-CM

## 2014-01-28 MED ORDER — IOHEXOL 180 MG/ML  SOLN
1.0000 mL | Freq: Once | INTRAMUSCULAR | Status: AC | PRN
  Administered 2014-01-28: 1 mL via EPIDURAL

## 2014-01-28 MED ORDER — METHYLPREDNISOLONE ACETATE 40 MG/ML INJ SUSP (RADIOLOG
120.0000 mg | Freq: Once | INTRAMUSCULAR | Status: AC
  Administered 2014-01-28: 120 mg via EPIDURAL

## 2014-01-28 NOTE — Discharge Instructions (Signed)

## 2014-03-08 ENCOUNTER — Encounter: Payer: No Typology Code available for payment source | Admitting: Internal Medicine

## 2014-04-11 ENCOUNTER — Other Ambulatory Visit: Payer: Self-pay | Admitting: Sports Medicine

## 2014-04-11 ENCOUNTER — Encounter: Payer: Self-pay | Admitting: *Deleted

## 2014-04-11 DIAGNOSIS — G8929 Other chronic pain: Secondary | ICD-10-CM

## 2014-04-11 DIAGNOSIS — M545 Low back pain: Principal | ICD-10-CM

## 2014-04-11 DIAGNOSIS — M5416 Radiculopathy, lumbar region: Secondary | ICD-10-CM

## 2014-04-14 ENCOUNTER — Other Ambulatory Visit: Payer: Self-pay | Admitting: Internal Medicine

## 2014-04-15 ENCOUNTER — Ambulatory Visit
Admission: RE | Admit: 2014-04-15 | Discharge: 2014-04-15 | Disposition: A | Discharge status: Home / Self Care | Payer: No Typology Code available for payment source | Source: Ambulatory Visit | Attending: Sports Medicine | Admitting: Sports Medicine

## 2014-04-15 DIAGNOSIS — M545 Low back pain, unspecified: Secondary | ICD-10-CM

## 2014-04-15 DIAGNOSIS — G8929 Other chronic pain: Secondary | ICD-10-CM

## 2014-04-15 MED ORDER — IOHEXOL 180 MG/ML  SOLN
1.0000 mL | Freq: Once | INTRAMUSCULAR | Status: AC | PRN
  Administered 2014-04-15: 1 mL via EPIDURAL

## 2014-04-15 MED ORDER — METHYLPREDNISOLONE ACETATE 40 MG/ML INJ SUSP (RADIOLOG
120.0000 mg | Freq: Once | INTRAMUSCULAR | Status: AC
  Administered 2014-04-15: 120 mg via EPIDURAL

## 2014-04-18 NOTE — Telephone Encounter (Signed)
Called to pharm 

## 2014-04-29 ENCOUNTER — Ambulatory Visit: Payer: Self-pay

## 2014-05-05 ENCOUNTER — Ambulatory Visit: Payer: Self-pay

## 2014-05-11 ENCOUNTER — Telehealth: Payer: Self-pay | Admitting: Internal Medicine

## 2014-05-11 NOTE — Telephone Encounter (Signed)
Call to patient to confirm appointment for 05/12/14 at 3:00 lmtcb

## 2014-05-12 ENCOUNTER — Ambulatory Visit: Payer: Self-pay

## 2014-05-20 ENCOUNTER — Ambulatory Visit: Payer: Self-pay | Admitting: Family Medicine

## 2014-05-26 ENCOUNTER — Ambulatory Visit: Payer: Self-pay | Admitting: Sports Medicine

## 2014-06-03 ENCOUNTER — Ambulatory Visit: Payer: Self-pay | Admitting: Family Medicine

## 2014-06-08 ENCOUNTER — Ambulatory Visit: Payer: Self-pay

## 2014-06-13 ENCOUNTER — Telehealth: Payer: Self-pay | Admitting: Internal Medicine

## 2014-06-13 NOTE — Telephone Encounter (Signed)
Call to patient to confirm appointment for 06/14/14 at 2:45 lmtcb

## 2014-06-14 ENCOUNTER — Ambulatory Visit (INDEPENDENT_AMBULATORY_CARE_PROVIDER_SITE_OTHER): Payer: Self-pay | Admitting: Internal Medicine

## 2014-06-14 ENCOUNTER — Encounter: Payer: Self-pay | Admitting: Internal Medicine

## 2014-06-14 VITALS — BP 105/76 | HR 86 | Temp 98.9°F | Ht 65.0 in | Wt 178.1 lb

## 2014-06-14 DIAGNOSIS — Z0000 Encounter for general adult medical examination without abnormal findings: Secondary | ICD-10-CM

## 2014-06-14 DIAGNOSIS — I10 Essential (primary) hypertension: Secondary | ICD-10-CM

## 2014-06-14 DIAGNOSIS — E785 Hyperlipidemia, unspecified: Secondary | ICD-10-CM

## 2014-06-14 DIAGNOSIS — M4806 Spinal stenosis, lumbar region: Secondary | ICD-10-CM

## 2014-06-14 DIAGNOSIS — Z1231 Encounter for screening mammogram for malignant neoplasm of breast: Secondary | ICD-10-CM | POA: Insufficient documentation

## 2014-06-14 DIAGNOSIS — M48061 Spinal stenosis, lumbar region without neurogenic claudication: Secondary | ICD-10-CM

## 2014-06-14 MED ORDER — PRAVASTATIN SODIUM 40 MG PO TABS: 40.0000 mg | ORAL_TABLET | Freq: Every day | ORAL | Status: DC

## 2014-06-14 MED ORDER — LISINOPRIL-HYDROCHLOROTHIAZIDE 20-12.5 MG PO TABS: 1.0000 | ORAL_TABLET | Freq: Every day | ORAL | Status: DC

## 2014-06-14 MED ORDER — ACETAMINOPHEN-CODEINE #2 300-15 MG PO TABS: 1.0000 | ORAL_TABLET | Freq: Four times a day (QID) | ORAL | Status: DC | PRN

## 2014-06-14 MED ORDER — IBUPROFEN 600 MG PO TABS: 600.0000 mg | ORAL_TABLET | Freq: Three times a day (TID) | ORAL | Status: DC | PRN

## 2014-06-14 MED ORDER — KETOROLAC TROMETHAMINE 30 MG/ML IJ SOLN
30.0000 mg | Freq: Once | INTRAMUSCULAR | Status: AC
  Administered 2014-06-14: 30 mg via INTRAMUSCULAR

## 2014-06-14 MED ORDER — KETOROLAC TROMETHAMINE 30 MG/ML IJ SOLN: 30.0000 mg | Freq: Once | INTRAMUSCULAR | Status: DC

## 2014-06-14 NOTE — Progress Notes (Signed)
Subjective:   [name] female   DOB: 1950/08/01 64 y.o.   MRN: [id number]  HPI: Olivia Ewing is a 64 y.o. female w/ PMHx of HTN, HLD, GERD, Depression, and back pain, presents to the clinic today for a follow-up visit regarding her back pain. Patient has had back pain for quite some time w/ MRI evidence of true spinal stenosis in the lumbar region. Patient has some mild LLE weakness, and paresthesias in the legs bilaterally. No significant difficulty w/ ambulation. Has previously not been a candidate for narcotic pain medications 2/2 cocaine abuse. She states her pain has been so bad lately she has been taking her sisters Vicodin. She had previously been taking Tylenol #2 and Flexeril, but states the Flexeril does not work unless she takes 2-4 of them at a time. She is not interested in a surgical option.   Past Medical History  Diagnosis Date  . Hyperlipidemia   . Hypertension   . GERD (gastroesophageal reflux disease)   . Depression   . Right knee pain     posterior horn medial meniscal tear MRI 2013  . Lumbar radiculopathy, chronic   . Cervicalgia    Current Outpatient Prescriptions  Medication Sig Dispense Refill  . acetaminophen-codeine (TYLENOL #2) 300-15 MG per tablet TAKE ONE TABLET EVERY SIX HOURS AS NEEDED FOR MODERATE PAIN 20 tablet 2  . albuterol (PROVENTIL HFA;VENTOLIN HFA) 108 (90 BASE) MCG/ACT inhaler Inhale 1-2 puffs into the lungs every 6 (six) hours as needed for wheezing or shortness of breath. 1 Inhaler 0  . Cholecalciferol 1000 UNITS CHEW Chew 1 tablet (1,000 Units total) by mouth daily. (Patient not taking: Reported on 12/30/2013) 30 tablet 3  . cyclobenzaprine (FLEXERIL) 10 MG tablet TAKE ONE AND ONE-HALF TABLETS TWICE DAILY AS NEEDED FOR MUSCLE SPASMS 60 tablet 1  . FLUoxetine (PROZAC) 20 MG capsule Take 20 mg by mouth daily.      Marland Kitchen gabapentin (NEURONTIN) 300 MG capsule Take 1 capsule (300 mg total) by mouth 3 (three) times daily. 90  capsule 3  . glucosamine-chondroitin 500-400 MG tablet Take 0.5 tablets by mouth daily.    Marland Kitchen ibuprofen (ADVIL,MOTRIN) 800 MG tablet Take 1 tablet (800 mg total) by mouth 3 (three) times daily. 21 tablet 0  . ipratropium (ATROVENT) 0.06 % nasal spray Place 2 sprays into both nostrils 4 (four) times daily. As needed for nasal congestion 15 mL 1  . lisinopril-hydrochlorothiazide (PRINZIDE,ZESTORETIC) 20-12.5 MG per tablet Take 1 tablet by mouth daily. 90 tablet 1  . Multiple Vitamin (MULTIVITAMIN WITH MINERALS) TABS tablet Take 1 tablet by mouth daily.    . niacin 250 MG tablet Take 250 mg by mouth daily.    . Olopatadine HCl 0.2 % SOLN Apply 1 drop to eye 2 (two) times daily. To both eyes (Patient not taking: Reported on 12/30/2013) 1 Bottle 0  . pravastatin (PRAVACHOL) 40 MG tablet Take 1 tablet (40 mg total) by mouth daily. (Patient not taking: Reported on 12/30/2013) 90 tablet 3  . QUEtiapine (SEROQUEL) 50 MG tablet Take 50 mg by mouth at bedtime.    Marland Kitchen VITAMIN E PO Take 1 capsule by mouth daily.     No current facility-administered medications for this visit.    Review of Systems  General: Denies fever, diaphoresis, appetite change, and fatigue.  Respiratory: Denies SOB, cough, and wheezing.   Cardiovascular: Denies chest pain and palpitations.  Gastrointestinal: Denies nausea, vomiting, abdominal pain, and diarrhea Musculoskeletal: Positive for  back pain. Denies myalgias, arthralgias, and gait problem.  Neurological: Denies dizziness, syncope, weakness, lightheadedness, and headaches.  Psychiatric/Behavioral: Denies mood changes, sleep disturbance, and agitation.   Objective:   Physical Exam: Filed Vitals:   06/14/14 1458  BP: 105/76  Pulse: 86  Temp: 98.9 F (37.2 C)  TempSrc: Oral  Height: 5\' 5"  (1.651 m)  Weight: 178 lb 1.6 oz (80.786 kg)  SpO2: 99%    General: AA female, alert, cooperative, NAD. HEENT: PERRL, EOMI. Moist mucus membranes Neck: Full range of motion  without pain, supple, no lymphadenopathy or carotid bruits Lungs: Clear to ascultation bilaterally, normal work of respiration, no wheezes, rales, rhonchi Heart: RRR, no murmurs, gallops, or rubs Abdomen: Soft, non-tender, non-distended, BS + Extremities: No cyanosis, clubbing, or edema. Tenderness over lumbar spine and paraspinal region.  Neurologic: Alert & oriented X3, cranial nerves II-XII intact, strength grossly intact, sensation intact to light touch. Positive straight leg raise w/ 4/5 strength in the LLE. Otherwise normal physical exam.    Assessment & Plan:   Please see problem based assessment and plan.

## 2014-06-14 NOTE — Patient Instructions (Signed)
General Instructions:  1. Please schedule follow up for 6 months.   2. Please take all medications as previously prescribed.   3. If you have worsening of your symptoms or new symptoms arise, please call the clinic [phone]), or go to the ER immediately if symptoms are severe.   Please bring your medicines with you each time you come to clinic.  Medicines may include prescription medications, over-the-counter medications, herbal remedies, eye drops, vitamins, or other pills.   Progress Toward Treatment Goals:  Treatment Goal 06/14/2014  Blood pressure at goal    Self Care Goals & Plans:  Self Care Goal 06/14/2014  Manage my medications take my medicines as prescribed; bring my medications to every visit; refill my medications on time  Monitor my health -  Eat healthy foods drink diet soda or water instead of juice or soda; eat more vegetables; eat foods that are low in salt; eat baked foods instead of fried foods; eat fruit for snacks and desserts  Be physically active -  Meeting treatment goals maintain the current self-care plan    No flowsheet data found.   Care Management & Community Referrals:  Referral 06/14/2014  Referrals made for care management support none needed  Referrals made to community resources none

## 2014-06-15 LAB — BASIC METABOLIC PANEL WITH GFR
BUN: 13 mg/dL (ref 6–23)
CHLORIDE: 106 meq/L (ref 96–112)
CO2: 25 mEq/L (ref 19–32)
Calcium: 9.7 mg/dL (ref 8.4–10.5)
Creat: 1.16 mg/dL — ABNORMAL HIGH (ref 0.50–1.10)
GFR, Est African American: 58 mL/min — ABNORMAL LOW
GFR, Est Non African American: 51 mL/min — ABNORMAL LOW
Glucose, Bld: 104 mg/dL — ABNORMAL HIGH (ref 70–99)
POTASSIUM: 4.3 meq/L (ref 3.5–5.3)
Sodium: 142 mEq/L (ref 135–145)

## 2014-06-15 NOTE — Assessment & Plan Note (Signed)
Referral sent for mammogram.

## 2014-06-15 NOTE — Assessment & Plan Note (Signed)
BP Readings from Last 3 Encounters:  06/14/14 105/76  04/15/14 161/105  01/28/14 157/98    Lab Results  Component Value Date   NA 142 06/14/2014   K 4.3 06/14/2014   CREATININE 1.16* 06/14/2014    Assessment: Blood pressure control: controlled Progress toward BP goal:  at goal Comments: Says she has been compliant w/ Lisinopril-HCTZ 20-12.5 daily  Plan: Medications:  continue current medications Educational resources provided: brochure (denies) Self management tools provided:   Other plans: RTC in 6 months

## 2014-06-15 NOTE — Assessment & Plan Note (Signed)
Patient w/ MRI suggestive of spinal stenosis. Exam significant for mild LLE weakness and mildly positive straight leg raise on the left side. Tender over lumbar spine. Asking for narcotic pain medications, however, patient has been cocaine positive on multiple previous occasions. Also states she has been taking her sister's Vicodin. Informed her that she is not to receive Vicodin or Norco from our clinic given multiple previous contract violations.  -Referral for neurosurgeon given severe spinal stenosis stated on MRI -Referral to pain specialist for adequate management of chronic pain -Refill Tylenol #2 prn #60 + Ibuprofen 600 q8h prn  -Encouraged heat and back support -Advised to her seek emergency medical attention of changes in strength or sensation in lower extremities.

## 2014-06-15 NOTE — Assessment & Plan Note (Signed)
Patient to come back in 2 weeks for PAP smear

## 2014-06-17 NOTE — Progress Notes (Signed)
INTERNAL MEDICINE TEACHING ATTENDING ADDENDUM - Yakelin Grenier, MD: I reviewed and discussed at the time of visit with the resident Dr. Jones, the patient's medical history, physical examination, diagnosis and results of pertinent tests and treatment and I agree with the patient's care as documented.  

## 2014-06-27 ENCOUNTER — Encounter: Payer: Self-pay | Admitting: Gastroenterology

## 2014-06-28 ENCOUNTER — Encounter: Payer: Self-pay | Admitting: Internal Medicine

## 2014-07-18 ENCOUNTER — Other Ambulatory Visit: Payer: Self-pay | Admitting: Internal Medicine

## 2014-07-18 DIAGNOSIS — M48061 Spinal stenosis, lumbar region without neurogenic claudication: Secondary | ICD-10-CM

## 2014-08-08 ENCOUNTER — Ambulatory Visit: Payer: Self-pay | Admitting: Family Medicine

## 2014-09-05 ENCOUNTER — Telehealth: Payer: Self-pay | Admitting: *Deleted

## 2014-09-05 NOTE — Telephone Encounter (Signed)
Received message left by patient on nurse line 09/05/14 at 1106.  Patient states she has bladder mesh and it is "poking" her significant other during intercourse.  Would like to know if there is any procedure which could fix this.  Requests a return call.  Attempted to contact patient via phone.  Got a busy signal unable to leave message.  Patient has appointment with Dr. Hulan Fray on 10/05/14 and can discuss her concerns with Dr. Hulan Fray at the visit.

## 2014-09-06 NOTE — Addendum Note (Signed)
Addended by: Hulan Fray on: 09/06/2014 06:14 PM   Modules accepted: Orders

## 2014-09-07 ENCOUNTER — Ambulatory Visit: Payer: Self-pay | Admitting: Obstetrics & Gynecology

## 2014-09-13 NOTE — Telephone Encounter (Signed)
Called patient stating I am returning her phone call, discussed with patient that we received her message and that the best time to discuss her concerns will be with Dr Hulan Fray on 8/24. Patient verbalized understanding and had no questions

## 2014-10-05 ENCOUNTER — Ambulatory Visit: Payer: Self-pay | Admitting: Obstetrics & Gynecology

## 2014-11-16 ENCOUNTER — Ambulatory Visit (INDEPENDENT_AMBULATORY_CARE_PROVIDER_SITE_OTHER): Payer: Self-pay | Admitting: Obstetrics & Gynecology

## 2014-11-16 ENCOUNTER — Encounter: Payer: Self-pay | Admitting: Obstetrics & Gynecology

## 2014-11-16 VITALS — BP 143/99 | HR 68 | Temp 98.6°F | Ht 65.0 in | Wt 174.0 lb

## 2014-11-16 DIAGNOSIS — F526 Dyspareunia not due to a substance or known physiological condition: Secondary | ICD-10-CM

## 2014-11-16 DIAGNOSIS — IMO0001 Reserved for inherently not codable concepts without codable children: Secondary | ICD-10-CM

## 2014-11-16 NOTE — Progress Notes (Signed)
   Subjective:    [name], female    DOB: 02-02-1951, 64 y.o.   MRN: [id number]  HPI  75 engaged AA lady here with the complaint that her fiance can feel something poking him during sex. She had a TVH, uterosacral vault suspension and TVT in the distant past with Dr. Burke Keels.  Review of Systems     Objective:   Physical Exam [id number] Breathing, conversing, and ambulating normally Abd- benign Vagina- 3rd degree rectocele 2 permanent sutures are palpable and visible at the edges of her vaginal cuff. I was able to remove them. She tolerated the procedure well       Assessment & Plan:  Eroded sutures causing pain with sex- Now removed RTC 1 year/prn sooner

## 2014-12-20 ENCOUNTER — Encounter: Payer: Self-pay | Admitting: Internal Medicine

## 2015-01-03 ENCOUNTER — Encounter: Payer: Self-pay | Admitting: Student

## 2015-02-07 ENCOUNTER — Encounter: Payer: Self-pay | Admitting: Internal Medicine

## 2015-02-07 ENCOUNTER — Emergency Department (HOSPITAL_COMMUNITY)
Admission: EM | Admit: 2015-02-07 | Discharge: 2015-02-07 | Disposition: A | Discharge status: Home / Self Care | Payer: Self-pay | Attending: Emergency Medicine | Admitting: Emergency Medicine

## 2015-02-07 ENCOUNTER — Emergency Department (HOSPITAL_COMMUNITY): Payer: Self-pay

## 2015-02-07 ENCOUNTER — Encounter (HOSPITAL_COMMUNITY): Payer: Self-pay | Admitting: Emergency Medicine

## 2015-02-07 DIAGNOSIS — Z8719 Personal history of other diseases of the digestive system: Secondary | ICD-10-CM | POA: Insufficient documentation

## 2015-02-07 DIAGNOSIS — Z87891 Personal history of nicotine dependence: Secondary | ICD-10-CM | POA: Insufficient documentation

## 2015-02-07 DIAGNOSIS — Z79899 Other long term (current) drug therapy: Secondary | ICD-10-CM | POA: Insufficient documentation

## 2015-02-07 DIAGNOSIS — J9801 Acute bronchospasm: Secondary | ICD-10-CM | POA: Insufficient documentation

## 2015-02-07 DIAGNOSIS — E785 Hyperlipidemia, unspecified: Secondary | ICD-10-CM | POA: Insufficient documentation

## 2015-02-07 DIAGNOSIS — F329 Major depressive disorder, single episode, unspecified: Secondary | ICD-10-CM | POA: Insufficient documentation

## 2015-02-07 DIAGNOSIS — R0602 Shortness of breath: Secondary | ICD-10-CM

## 2015-02-07 DIAGNOSIS — Z8739 Personal history of other diseases of the musculoskeletal system and connective tissue: Secondary | ICD-10-CM | POA: Insufficient documentation

## 2015-02-07 DIAGNOSIS — I10 Essential (primary) hypertension: Secondary | ICD-10-CM | POA: Insufficient documentation

## 2015-02-07 HISTORY — DX: Other allergy status, other than to drugs and biological substances: Z91.09

## 2015-02-07 LAB — CBC WITH DIFFERENTIAL/PLATELET
BASOS ABS: 0 10*3/uL (ref 0.0–0.1)
BASOS PCT: 1 %
EOS ABS: 0.2 10*3/uL (ref 0.0–0.7)
EOS PCT: 5 %
HCT: 35.3 % — ABNORMAL LOW (ref 36.0–46.0)
HEMOGLOBIN: 11 g/dL — AB (ref 12.0–15.0)
LYMPHS ABS: 2.2 10*3/uL (ref 0.7–4.0)
Lymphocytes Relative: 49 %
MCH: 26.6 pg (ref 26.0–34.0)
MCHC: 31.2 g/dL (ref 30.0–36.0)
MCV: 85.5 fL (ref 78.0–100.0)
Monocytes Absolute: 0.3 10*3/uL (ref 0.1–1.0)
Monocytes Relative: 7 %
NEUTROS PCT: 38 %
Neutro Abs: 1.7 10*3/uL (ref 1.7–7.7)
PLATELETS: 331 10*3/uL (ref 150–400)
RBC: 4.13 MIL/uL (ref 3.87–5.11)
RDW: 13.8 % (ref 11.5–15.5)
WBC: 4.4 10*3/uL (ref 4.0–10.5)

## 2015-02-07 LAB — COMPREHENSIVE METABOLIC PANEL
ALBUMIN: 4.3 g/dL (ref 3.5–5.0)
ALK PHOS: 72 U/L (ref 38–126)
ALT: 11 U/L — AB (ref 14–54)
AST: 72 U/L — AB (ref 15–41)
Anion gap: 7 (ref 5–15)
BUN: 8 mg/dL (ref 6–20)
CALCIUM: 9.3 mg/dL (ref 8.9–10.3)
CHLORIDE: 102 mmol/L (ref 101–111)
CO2: 28 mmol/L (ref 22–32)
CREATININE: 1.18 mg/dL — AB (ref 0.44–1.00)
GFR calc non Af Amer: 48 mL/min — ABNORMAL LOW (ref >60)
GFR, EST AFRICAN AMERICAN: 56 mL/min — AB (ref >60)
GLUCOSE: 86 mg/dL (ref 65–99)
Potassium: 5.9 mmol/L — ABNORMAL HIGH (ref 3.5–5.1)
SODIUM: 137 mmol/L (ref 135–145)
Total Bilirubin: 1.8 mg/dL — ABNORMAL HIGH (ref 0.3–1.2)
Total Protein: 7.4 g/dL (ref 6.5–8.1)

## 2015-02-07 LAB — POTASSIUM: Potassium: 2.9 mmol/L — ABNORMAL LOW (ref 3.5–5.1)

## 2015-02-07 MED ORDER — ALBUTEROL SULFATE (2.5 MG/3ML) 0.083% IN NEBU
5.0000 mg | INHALATION_SOLUTION | Freq: Once | RESPIRATORY_TRACT | Status: AC
  Administered 2015-02-07: 5 mg via RESPIRATORY_TRACT
  Filled 2015-02-07: qty 6

## 2015-02-07 MED ORDER — IPRATROPIUM BROMIDE 0.02 % IN SOLN
0.5000 mg | Freq: Once | RESPIRATORY_TRACT | Status: AC
  Administered 2015-02-07: 0.5 mg via RESPIRATORY_TRACT
  Filled 2015-02-07: qty 2.5

## 2015-02-07 MED ORDER — METHYLPREDNISOLONE SODIUM SUCC 125 MG IJ SOLR
125.0000 mg | Freq: Once | INTRAMUSCULAR | Status: AC
Start: 2015-02-07 — End: 2015-02-07
  Administered 2015-02-07: 125 mg via INTRAVENOUS
  Filled 2015-02-07: qty 2

## 2015-02-07 MED ORDER — KETOROLAC TROMETHAMINE 30 MG/ML IJ SOLN
30.0000 mg | Freq: Once | INTRAMUSCULAR | Status: AC
  Administered 2015-02-07: 30 mg via INTRAVENOUS
  Filled 2015-02-07: qty 1

## 2015-02-07 MED ORDER — ALBUTEROL SULFATE HFA 108 (90 BASE) MCG/ACT IN AERS: 1.0000 | INHALATION_SPRAY | Freq: Four times a day (QID) | RESPIRATORY_TRACT | Status: DC | PRN

## 2015-02-07 MED ORDER — PREDNISONE 10 MG PO TABS: 20.0000 mg | ORAL_TABLET | Freq: Every day | ORAL | Status: DC

## 2015-02-07 NOTE — ED Notes (Signed)
Awake. Verbally responsive. A/O x4. Resp even and unlabored. No audible adventitious breath sounds noted. ABC's intact.  

## 2015-02-07 NOTE — ED Notes (Signed)
md at bedside

## 2015-02-07 NOTE — ED Notes (Signed)
Pt ambulated to BR to void with steady gait. No SHOB/dyspnea noted.

## 2015-02-07 NOTE — ED Notes (Signed)
md at bedside  Pt alert and oriented x4. Respirations even and unlabored, bilateral symmetrical rise and fall of chest. Skin warm and dry. In no acute distress. Denies needs.   

## 2015-02-07 NOTE — ED Notes (Signed)
Pt has allergies to dogs and smoke. Has been around family member with both allergens. Ran out of home albuterol inhaler and is sob with audible wheezing.

## 2015-02-07 NOTE — ED Notes (Signed)
Pt back from x-ray.

## 2015-02-07 NOTE — ED Notes (Signed)
Pt given crackers and sprite upon request

## 2015-02-07 NOTE — ED Notes (Signed)
Pt to xray

## 2015-02-07 NOTE — ED Notes (Signed)
Pt awake. Verbally responsive. Resp even and unlabored. No audible adventitious breath sounds noted. No SHOB/dyspnea noted. Pt reported that she felt better.

## 2015-02-07 NOTE — Discharge Instructions (Signed)
Follow up with your md if not improving. °

## 2015-02-07 NOTE — ED Provider Notes (Signed)
CSN: [id number]     Arrival date & time 02/07/15  [id number] History   First MD Initiated Contact with Patient 02/07/15 1016     Chief Complaint  Patient presents with  . Shortness of Breath     (Consider location/radiation/quality/duration/timing/severity/associated sxs/prior Treatment) Patient is a 64 y.o. female presenting with shortness of breath. The history is provided by the patient (Patient complains of wheezing and shortness of breath. Patient has a history of asthma).  Shortness of Breath Severity:  Mild Onset quality:  Sudden Timing:  Constant Progression:  Waxing and waning Chronicity:  Recurrent Context: activity   Relieved by:  Nothing Associated symptoms: wheezing   Associated symptoms: no abdominal pain, no chest pain, no cough, no headaches and no rash     Past Medical History  Diagnosis Date  . Hyperlipidemia   . Hypertension   . GERD (gastroesophageal reflux disease)   . Depression   . Right knee pain     posterior horn medial meniscal tear MRI 2013  . Lumbar radiculopathy, chronic   . Cervicalgia   . Environmental allergies     cause SOB, uses inhaler for   Past Surgical History  Procedure Laterality Date  . Hysterectomy other    . Bladder tack  10/2006    cystocoele  . Appendectomy     Family History  Problem Relation Age of Onset  . Hypertension Mother    Social History  Substance Use Topics  . Smoking status: Former Smoker    Quit date: 11/26/1983  . Smokeless tobacco: Never Used  . Alcohol Use: 0.0 oz/week    0 Standard drinks or equivalent per week     Comment: socially   OB History    Gravida Para Term Preterm AB TAB SAB Ectopic Multiple Living   7 4 4  0 3 0 3 0 0 4     Review of Systems  Constitutional: Negative for appetite change and fatigue.  HENT: Negative for congestion, ear discharge and sinus pressure.   Eyes: Negative for discharge.  Respiratory: Positive for shortness of breath and wheezing. Negative for cough.    Cardiovascular: Negative for chest pain.  Gastrointestinal: Negative for abdominal pain and diarrhea.  Genitourinary: Negative for frequency and hematuria.  Musculoskeletal: Negative for back pain.  Skin: Negative for rash.  Neurological: Negative for seizures and headaches.  Psychiatric/Behavioral: Negative for hallucinations.      Allergies  Review of patient's allergies indicates no known allergies.  Home Medications   Prior to Admission medications   Medication Sig Start Date End Date Taking? Authorizing Provider  FLUoxetine (PROZAC) 20 MG capsule Take 20 mg by mouth daily.     Yes Historical Provider, MD  gabapentin (NEURONTIN) 300 MG capsule Take 1 capsule (300 mg total) by mouth 3 (three) times daily. 11/25/13  Yes Juluis Mire, MD  glucosamine-chondroitin 500-400 MG tablet Take 0.5 tablets by mouth daily.   Yes Historical Provider, MD  lisinopril-hydrochlorothiazide (PRINZIDE,ZESTORETIC) 20-12.5 MG per tablet Take 1 tablet by mouth daily. 06/14/14  Yes Corky Sox, MD  Multiple Vitamin (MULTIVITAMIN WITH MINERALS) TABS tablet Take 1 tablet by mouth daily.   Yes Historical Provider, MD  naproxen sodium (ANAPROX) 220 MG tablet Take 440 mg by mouth 4 (four) times daily as needed (pain).   Yes Historical Provider, MD  PE-DM-APAP & Doxylamin-DM-APAP (VICKS DAYQUIL/NYQUIL CLD & FLU) (LIQUID) MISC Take 1 Dose by mouth at bedtime as needed (cold symptoms).   Yes Historical Provider, MD  Phenyleph-CPM-DM-APAP [place]  PLUS COLD & FLU PO) Take 2 capsules by mouth 2 (two) times daily as needed (cold symptoms).   Yes Historical Provider, MD  Phenylephrine-DM-GG (MUCINEX FAST-MAX CONGEST COUGH) 2.5-5-100 MG/5ML LIQD Take 1 Dose by mouth daily as needed (cold symptoms).   Yes Historical Provider, MD  QUEtiapine (SEROQUEL) 50 MG tablet Take 50 mg by mouth at bedtime.   Yes Historical Provider, MD  albuterol (PROVENTIL HFA;VENTOLIN HFA) 108 (90 BASE) MCG/ACT inhaler Inhale 1-2 puffs into  the lungs every 6 (six) hours as needed for wheezing or shortness of breath. 02/07/15   Milton Ferguson, MD  pravastatin (PRAVACHOL) 40 MG tablet Take 1 tablet (40 mg total) by mouth daily. Patient not taking: Reported on 11/16/2014 06/14/14   Corky Sox, MD  predniSONE (DELTASONE) 10 MG tablet Take 2 tablets (20 mg total) by mouth daily. 02/07/15   Milton Ferguson, MD   BP 120/79 mmHg  Pulse 82  Temp(Src) 98.4 F (36.9 C) (Oral)  Resp 20  SpO2 100% Physical Exam  Constitutional: She is oriented to person, place, and time. She appears well-developed.  HENT:  Head: Normocephalic.  Eyes: Conjunctivae and EOM are normal. No scleral icterus.  Neck: Neck supple. No thyromegaly present.  Cardiovascular: Normal rate and regular rhythm.  Exam reveals no gallop and no friction rub.   No murmur heard. Pulmonary/Chest: No stridor. She has wheezes. She has no rales. She exhibits no tenderness.  Abdominal: She exhibits no distension. There is no tenderness. There is no rebound.  Musculoskeletal: Normal range of motion. She exhibits no edema.  Lymphadenopathy:    She has no cervical adenopathy.  Neurological: She is oriented to person, place, and time. She exhibits normal muscle tone. Coordination normal.  Skin: No rash noted. No erythema.  Psychiatric: She has a normal mood and affect. Her behavior is normal.    ED Course  Procedures (including critical care time) Labs Review Labs Reviewed  CBC WITH DIFFERENTIAL/PLATELET - Abnormal; Notable for the following:    Hemoglobin 11.0 (*)    HCT 35.3 (*)    All other components within normal limits  COMPREHENSIVE METABOLIC PANEL - Abnormal; Notable for the following:    Potassium 5.9 (*)    Creatinine, Ser 1.18 (*)    AST 72 (*)    ALT 11 (*)    Total Bilirubin 1.8 (*)    GFR calc non Af Amer 48 (*)    GFR calc Af Amer 56 (*)    All other components within normal limits  POTASSIUM - Abnormal; Notable for the following:    Potassium 2.9 (*)     All other components within normal limits    Imaging Review Dg Chest 2 View  02/07/2015  CLINICAL DATA:  64 year old female with progressive shortness of breath EXAM: CHEST  2 VIEW COMPARISON:  Prior chest x-ray 02/08/2011 FINDINGS: Cardiac and mediastinal contours are unchanged and remain within normal limits. No focal airspace consolidation, pleural effusion, pulmonary edema or pneumothorax. Minimal bibasilar atelectasis versus scarring. Central bronchitic changes are similar compared to prior. No acute osseous abnormality. IMPRESSION: 1. Mild bibasilar atelectasis. 2. Otherwise, no acute cardiopulmonary process. Electronically Signed   By: Jacqulynn Cadet M.D.   On: 02/07/2015 11:43   I have personally reviewed and evaluated these images and lab results as part of my medical decision-making.   EKG Interpretation None      MDM   Final diagnoses:  Bronchospasm    Patient improved with treatment. Diagnosis asthma exacerbation. Patient  sent home with prednisone and albuterol inhaler and will follow-up with PCP    Milton Ferguson, MD 02/07/15 1322

## 2015-02-21 ENCOUNTER — Other Ambulatory Visit: Payer: Self-pay | Admitting: Internal Medicine

## 2015-02-21 ENCOUNTER — Other Ambulatory Visit: Payer: Self-pay | Admitting: *Deleted

## 2015-02-21 DIAGNOSIS — I10 Essential (primary) hypertension: Secondary | ICD-10-CM

## 2015-02-21 DIAGNOSIS — E785 Hyperlipidemia, unspecified: Secondary | ICD-10-CM

## 2015-02-21 NOTE — Telephone Encounter (Signed)
Has not had visit since 06/2014, next visit scheduled 03/21/2015

## 2015-02-21 NOTE — Telephone Encounter (Signed)
Patient requesting her lisinopril and a cholesterol medicine to be refilled

## 2015-02-22 MED ORDER — PRAVASTATIN SODIUM 40 MG PO TABS: 40.0000 mg | ORAL_TABLET | Freq: Every day | ORAL | Status: DC

## 2015-02-22 MED ORDER — LISINOPRIL-HYDROCHLOROTHIAZIDE 20-12.5 MG PO TABS: 1.0000 | ORAL_TABLET | Freq: Every day | ORAL | Status: DC

## 2015-03-06 NOTE — Telephone Encounter (Signed)
Done previously

## 2015-03-21 ENCOUNTER — Encounter: Payer: Self-pay | Admitting: Internal Medicine

## 2015-03-22 ENCOUNTER — Other Ambulatory Visit: Payer: Self-pay | Admitting: Sports Medicine

## 2015-03-22 ENCOUNTER — Other Ambulatory Visit: Payer: Self-pay | Admitting: *Deleted

## 2015-03-22 DIAGNOSIS — M5416 Radiculopathy, lumbar region: Secondary | ICD-10-CM

## 2015-03-22 DIAGNOSIS — M545 Low back pain: Principal | ICD-10-CM

## 2015-03-22 DIAGNOSIS — G8929 Other chronic pain: Secondary | ICD-10-CM

## 2015-03-23 ENCOUNTER — Encounter: Payer: Self-pay | Admitting: Internal Medicine

## 2015-03-24 ENCOUNTER — Ambulatory Visit: Payer: Self-pay | Admitting: Family Medicine

## 2015-03-27 ENCOUNTER — Ambulatory Visit
Admission: RE | Admit: 2015-03-27 | Discharge: 2015-03-27 | Disposition: A | Discharge status: Home / Self Care | Payer: BLUE CROSS/BLUE SHIELD | Source: Ambulatory Visit | Attending: Sports Medicine | Admitting: Sports Medicine

## 2015-03-27 DIAGNOSIS — M545 Low back pain: Principal | ICD-10-CM

## 2015-03-27 DIAGNOSIS — G8929 Other chronic pain: Secondary | ICD-10-CM

## 2015-03-27 MED ORDER — IOHEXOL 180 MG/ML  SOLN
1.0000 mL | Freq: Once | INTRAMUSCULAR | Status: AC | PRN
  Administered 2015-03-27: 1 mL via EPIDURAL

## 2015-03-27 MED ORDER — METHYLPREDNISOLONE ACETATE 40 MG/ML INJ SUSP (RADIOLOG
120.0000 mg | Freq: Once | INTRAMUSCULAR | Status: AC
  Administered 2015-03-27: 120 mg via EPIDURAL

## 2015-03-27 NOTE — Discharge Instructions (Signed)

## 2015-04-28 ENCOUNTER — Ambulatory Visit: Payer: BLUE CROSS/BLUE SHIELD | Admitting: Family Medicine

## 2015-05-01 ENCOUNTER — Other Ambulatory Visit: Payer: Self-pay | Admitting: Internal Medicine

## 2015-05-02 ENCOUNTER — Ambulatory Visit (INDEPENDENT_AMBULATORY_CARE_PROVIDER_SITE_OTHER): Payer: BLUE CROSS/BLUE SHIELD | Admitting: Internal Medicine

## 2015-05-02 ENCOUNTER — Encounter: Payer: Self-pay | Admitting: Internal Medicine

## 2015-05-02 VITALS — BP 144/85 | HR 64 | Temp 98.4°F | Ht 65.0 in | Wt 172.7 lb

## 2015-05-02 DIAGNOSIS — M5416 Radiculopathy, lumbar region: Secondary | ICD-10-CM | POA: Diagnosis not present

## 2015-05-02 DIAGNOSIS — R748 Abnormal levels of other serum enzymes: Secondary | ICD-10-CM | POA: Insufficient documentation

## 2015-05-02 DIAGNOSIS — Z0000 Encounter for general adult medical examination without abnormal findings: Secondary | ICD-10-CM

## 2015-05-02 DIAGNOSIS — I10 Essential (primary) hypertension: Secondary | ICD-10-CM

## 2015-05-02 DIAGNOSIS — G8929 Other chronic pain: Secondary | ICD-10-CM

## 2015-05-02 DIAGNOSIS — D649 Anemia, unspecified: Secondary | ICD-10-CM | POA: Diagnosis not present

## 2015-05-02 DIAGNOSIS — R740 Nonspecific elevation of levels of transaminase and lactic acid dehydrogenase [LDH]: Secondary | ICD-10-CM

## 2015-05-02 DIAGNOSIS — Z23 Encounter for immunization: Secondary | ICD-10-CM

## 2015-05-02 DIAGNOSIS — Z1231 Encounter for screening mammogram for malignant neoplasm of breast: Secondary | ICD-10-CM | POA: Insufficient documentation

## 2015-05-02 MED ORDER — LISINOPRIL-HYDROCHLOROTHIAZIDE 20-12.5 MG PO TABS: 1.0000 | ORAL_TABLET | Freq: Every day | ORAL | Status: DC

## 2015-05-02 MED ORDER — CYCLOBENZAPRINE HCL 5 MG PO TABS: 5.0000 mg | ORAL_TABLET | Freq: Three times a day (TID) | ORAL | Status: DC | PRN

## 2015-05-02 NOTE — Patient Instructions (Signed)
1. Please make a follow up appointment for 3 months.   2. Please take all medications as previously prescribed with the following changes:  Continue Lisinopril-HCTZ  Take Flexeril 5 mg every 8 hours ONLY as needed for muscle spasms.   We will schedule your mammo.   3. If you have worsening of your symptoms or new symptoms arise, please call the clinic [phone]), or go to the ER immediately if symptoms are severe.

## 2015-05-02 NOTE — Progress Notes (Signed)
Subjective:   [name] female   DOB: Feb 19, 1950 65 y.o.   MRN: [id number]  HPI: [name] is a 65 y.o. female w/ PMHx of HTN, HLD, GERD, Depression, and back pain, presents to the clinic today for a follow-up visit regarding her back pain and HTN. Says she has been doing well, still dealing with her back pain. Sees sports medicine regularly, had a recent epidural injection for this. Wants 6 Vicodin for her pain today. Her pain is describes as burning in her feet, as well as muscle spasm type pain in her left thigh, originating from her back. No saddle anesthesia, LE weakness, or incontinence.   BP is stable. Has been taking her medications regularly.   Depression stable, follows with mental health for this.   Past Medical History  Diagnosis Date  . Hyperlipidemia   . Hypertension   . GERD (gastroesophageal reflux disease)   . Depression   . Right knee pain     posterior horn medial meniscal tear MRI 2013  . Lumbar radiculopathy, chronic   . Cervicalgia   . Environmental allergies     cause SOB, uses inhaler for   Current Outpatient Prescriptions  Medication Sig Dispense Refill  . albuterol (PROVENTIL HFA;VENTOLIN HFA) 108 (90 BASE) MCG/ACT inhaler Inhale 1-2 puffs into the lungs every 6 (six) hours as needed for wheezing or shortness of breath. 1 Inhaler 0  . FLUoxetine (PROZAC) 20 MG capsule Take 20 mg by mouth daily.      Marland Kitchen gabapentin (NEURONTIN) 300 MG capsule Take 1 capsule (300 mg total) by mouth 3 (three) times daily. 90 capsule 3  . glucosamine-chondroitin 500-400 MG tablet Take 0.5 tablets by mouth daily.    Marland Kitchen lisinopril-hydrochlorothiazide (PRINZIDE,ZESTORETIC) 20-12.5 MG tablet TAKE 1 TABLET BY MOUTH DAILY 90 tablet 1  . Multiple Vitamin (MULTIVITAMIN WITH MINERALS) TABS tablet Take 1 tablet by mouth daily.    . naproxen sodium (ANAPROX) 220 MG tablet Take 440 mg by mouth 4 (four) times daily as needed (pain).    Marland Kitchen PE-DM-APAP &  Doxylamin-DM-APAP (VICKS DAYQUIL/NYQUIL CLD & FLU) (LIQUID) MISC Take 1 Dose by mouth at bedtime as needed (cold symptoms).    . Phenyleph-CPM-DM-APAP (ALKA-SELTZER PLUS COLD & FLU PO) Take 2 capsules by mouth 2 (two) times daily as needed (cold symptoms).    . Phenylephrine-DM-GG (MUCINEX FAST-MAX CONGEST COUGH) 2.5-5-100 MG/5ML LIQD Take 1 Dose by mouth daily as needed (cold symptoms).    . pravastatin (PRAVACHOL) 40 MG tablet Take 1 tablet (40 mg total) by mouth daily. 90 tablet 0  . predniSONE (DELTASONE) 10 MG tablet Take 2 tablets (20 mg total) by mouth daily. 6 tablet 0  . QUEtiapine (SEROQUEL) 50 MG tablet Take 50 mg by mouth at bedtime.     No current facility-administered medications for this visit.    Review of Systems  General: Denies fever, diaphoresis, appetite change, and fatigue.  Respiratory: Denies SOB, cough, and wheezing.   Cardiovascular: Denies chest pain and palpitations.  Gastrointestinal: Denies nausea, vomiting, abdominal pain, and diarrhea Musculoskeletal: Positive for back pain. Denies myalgias, arthralgias, and gait problem.  Neurological: Denies dizziness, syncope, weakness, lightheadedness, and headaches.  Psychiatric/Behavioral: Denies mood changes, sleep disturbance, and agitation.   Objective:   Physical Exam: Filed Vitals:   05/02/15 1402  BP: 144/85  Pulse: 64  Temp: 98.4 F (36.9 C)  TempSrc: Oral  Height: 5\' 5"  (1.651 m)  Weight: 172 lb 11.2 oz (78.336 kg)  SpO2: 100%    General: AA female, alert, cooperative, NAD. HEENT: PERRL, EOMI. Moist mucus membranes Neck: Full range of motion without pain, supple, no lymphadenopathy or carotid bruits Lungs: Clear to ascultation bilaterally, normal work of respiration, no wheezes, rales, rhonchi Heart: RRR, no murmurs, gallops, or rubs Abdomen: Soft, non-tender, non-distended, BS + Extremities: No cyanosis, clubbing, or edema. Tenderness over lumbar spine and paraspinal region.  Neurologic: Alert  & oriented X3, cranial nerves II-XII intact, strength grossly intact, sensation intact to light touch. Positive straight leg raise in the RLE. Otherwise normal physical exam.    Assessment & Plan:   Please see problem based assessment and plan.

## 2015-05-03 ENCOUNTER — Telehealth: Payer: Self-pay | Admitting: *Deleted

## 2015-05-03 LAB — CBC WITH DIFFERENTIAL/PLATELET
BASOS ABS: 0 10*3/uL (ref 0.0–0.2)
Basos: 1 %
EOS (ABSOLUTE): 0.2 10*3/uL (ref 0.0–0.4)
EOS: 3 %
Hematocrit: 33.8 % — ABNORMAL LOW (ref 34.0–46.6)
Hemoglobin: 10.8 g/dL — ABNORMAL LOW (ref 11.1–15.9)
IMMATURE GRANULOCYTES: 0 %
Immature Grans (Abs): 0 10*3/uL (ref 0.0–0.1)
LYMPHS ABS: 2.3 10*3/uL (ref 0.7–3.1)
Lymphs: 44 %
MCH: 26.4 pg — ABNORMAL LOW (ref 26.6–33.0)
MCHC: 32 g/dL (ref 31.5–35.7)
MCV: 83 fL (ref 79–97)
MONOS ABS: 0.4 10*3/uL (ref 0.1–0.9)
Monocytes: 7 %
NEUTROS PCT: 45 %
Neutrophils Absolute: 2.4 10*3/uL (ref 1.4–7.0)
Platelets: 330 10*3/uL (ref 150–379)
RBC: 4.09 x10E6/uL (ref 3.77–5.28)
RDW: 15 % (ref 12.3–15.4)
WBC: 5.3 10*3/uL (ref 3.4–10.8)

## 2015-05-03 LAB — CMP14 + ANION GAP
ALBUMIN: 4.5 g/dL (ref 3.6–4.8)
ALK PHOS: 79 IU/L (ref 39–117)
ALT: 13 IU/L (ref 0–32)
AST: 23 IU/L (ref 0–40)
Albumin/Globulin Ratio: 1.7 (ref 1.2–2.2)
Anion Gap: 20 mmol/L — ABNORMAL HIGH (ref 10.0–18.0)
BUN / CREAT RATIO: 16 (ref 11–26)
BUN: 12 mg/dL (ref 8–27)
CHLORIDE: 98 mmol/L (ref 96–106)
CO2: 24 mmol/L (ref 18–29)
Calcium: 9.7 mg/dL (ref 8.7–10.3)
Creatinine, Ser: 0.76 mg/dL (ref 0.57–1.00)
GFR calc Af Amer: 97 mL/min/{1.73_m2} (ref >59)
GFR calc non Af Amer: 84 mL/min/{1.73_m2} (ref >59)
GLOBULIN, TOTAL: 2.7 g/dL (ref 1.5–4.5)
Glucose: 98 mg/dL (ref 65–99)
Potassium: 3.8 mmol/L (ref 3.5–5.2)
Sodium: 142 mmol/L (ref 134–144)
Total Protein: 7.2 g/dL (ref 6.0–8.5)

## 2015-05-03 LAB — FERRITIN: FERRITIN: 40 ng/mL (ref 15–150)

## 2015-05-03 MED ORDER — FERROUS SULFATE 325 (65 FE) MG PO TABS: 325.0000 mg | ORAL_TABLET | Freq: Every day | ORAL | Status: DC

## 2015-05-03 NOTE — Assessment & Plan Note (Signed)
BP Readings from Last 3 Encounters:  05/02/15 144/85  03/27/15 156/94  02/07/15 108/88    Lab Results  Component Value Date   NA 142 05/02/2015   K 3.8 05/02/2015   CREATININE 0.76 05/02/2015    Assessment: Blood pressure control:  Slightly elevated.  Comments: Did not take her BP medication today as she states she recently ran out.   Plan: Medications:  continue current medications; Lisinopril-HCTZ 20-12.5 mg daily.  Other plans: BMP checked today, normal renal function, electrolytes stable. RTC in 3-6 months.

## 2015-05-03 NOTE — Assessment & Plan Note (Signed)
Patient with chronic back pain, follows with sports medicine. Describes symptoms of radiculopathy as well as muscle spasm, mostly in the left thigh today. Positive straight leg raise in the right leg. Asking for Vicodin.  -Given Rx for Flexeril for muscle spasm in the left thigh.  -Encouraged continued use of Neurontin for radicular pain -Follow up with sports medicine for continued management

## 2015-05-03 NOTE — Assessment & Plan Note (Signed)
Mammo, flu shot given today.

## 2015-05-03 NOTE — Assessment & Plan Note (Signed)
Mammogram scheduled.

## 2015-05-03 NOTE — Assessment & Plan Note (Signed)
Previous LFT's slightly abnormal. Repeat with no abnormalities.

## 2015-05-03 NOTE — Assessment & Plan Note (Addendum)
Borderline normocytic, ferritin 40. Asymptomatic. Chronic. Most likely mild iron deficiency.  -Start Ferrous sulfate 325 mg daily.

## 2015-05-03 NOTE — Telephone Encounter (Signed)
Call to patient message left on voicemail that a Mammogram has been scheduled for her on 05/12/2015 at 12:10 PM at the [place].  Breast Center location given as  [address]  Number for Clinics given for patient to call if questions and to ask for [facility].  Sander Nephew, RN 05/03/2015 9:16 AM.

## 2015-05-05 NOTE — Progress Notes (Signed)
Internal Medicine Clinic Attending  Case discussed with Dr. Jones soon after the resident saw the patient.  We reviewed the resident's history and exam and pertinent patient test results.  I agree with the assessment, diagnosis, and plan of care documented in the resident's note. 

## 2015-05-12 ENCOUNTER — Encounter: Payer: Self-pay | Admitting: Family Medicine

## 2015-05-12 ENCOUNTER — Ambulatory Visit: Payer: BLUE CROSS/BLUE SHIELD

## 2015-05-12 ENCOUNTER — Ambulatory Visit (INDEPENDENT_AMBULATORY_CARE_PROVIDER_SITE_OTHER): Payer: BLUE CROSS/BLUE SHIELD | Admitting: Family Medicine

## 2015-05-12 VITALS — BP 156/92 | Ht 65.0 in | Wt 172.0 lb

## 2015-05-12 DIAGNOSIS — M4806 Spinal stenosis, lumbar region: Secondary | ICD-10-CM | POA: Diagnosis not present

## 2015-05-12 DIAGNOSIS — M48061 Spinal stenosis, lumbar region without neurogenic claudication: Secondary | ICD-10-CM

## 2015-05-12 DIAGNOSIS — M5416 Radiculopathy, lumbar region: Secondary | ICD-10-CM

## 2015-05-12 MED ORDER — PREDNISONE 10 MG PO TABS: ORAL_TABLET | ORAL | Status: DC

## 2015-05-12 MED ORDER — GABAPENTIN 300 MG PO CAPS: ORAL_CAPSULE | ORAL | Status: DC

## 2015-05-12 NOTE — Assessment & Plan Note (Signed)
Acute pain on the right leg, similar to previous pain she's had on the left but never this bad. I suspect she may have small disc fragment now on the right. She has existing severe spinal stenosis on the left. Given her symptoms am concerned that she now has new component of severe stenosis on the right. I think we need to get MRI to evaluate this.  Given her severe pain today I will do a steroid burst, also will rapidly titrate her gabapentin up. I'll see her back 2 weeks. Hopefully we can get the MRI and that time.

## 2015-05-12 NOTE — Patient Instructions (Signed)
Increase gabapentin by tapering up as follows: Day 1-4- Take -2 at night, one in AM and one at l;unch Day 5-9 -- Take 2 at night, 2 in AM and one at lunch  Starting with day 10 take 2 three times a day

## 2015-05-12 NOTE — Progress Notes (Signed)
   Subjective:    [name], female    DOB: 1950-08-28, 65 y.o.   MRN: [id number]  HPI New right leg pain over the last week. She has had problems with low back pain and occasional left-sided radiculopathy for some time. She has had several epidural steroid injections which have been fairly beneficial. She had the last one 2 weeks ago and initially it was beneficial. In the last 4 or 5 days she's had increase in low back pain with a new type of radicular right thigh pain. Pain is on the anterior lateral thigh, sort of a burning numbness. It shoots down from the low back at times. Is worse with certain positions and sometimes she cannot find a comfortable position. At the time of the office visit she's having quite a bit of difficulty finding a comfortable position.   Review of Systems No unusual weight change, fever, sweats, chills. No incontinence of bowel or bladder. No abdominal pain.    Objective:   Physical Exam  Vital signs are reviewed, elevated blood pressure noted GEN.: Well-developed female in mild to moderate distress with her low back hip and right leg pain. BACK: Nontender to palpation or percussion. There is no sign of deformity. Straight leg raise positive on the right at 90 seated position. SKIN: Skin of the low back in the right lower extremity is without any sign of erythema, no rash, no lesions. MSK: Lower extremity strength 5 on left hip flexor, right hip flexors 4-5 out of 5 although this weakness may be partly related to pain with hip flexion and extension. NEURO: DTRs 2+ at knee and ankle. Intact soft touch sensation bilateral feet.  Imaging review: Severe spinal stenosis on MRI 2015 periods is especially notable on the left.        Assessment & Plan:

## 2015-05-16 ENCOUNTER — Ambulatory Visit: Payer: BLUE CROSS/BLUE SHIELD

## 2015-05-18 ENCOUNTER — Ambulatory Visit: Payer: BLUE CROSS/BLUE SHIELD | Admitting: Obstetrics & Gynecology

## 2015-05-19 ENCOUNTER — Inpatient Hospital Stay: Admission: RE | Admit: 2015-05-19 | Payer: BLUE CROSS/BLUE SHIELD | Source: Ambulatory Visit

## 2015-05-20 ENCOUNTER — Ambulatory Visit
Admission: RE | Admit: 2015-05-20 | Discharge: 2015-05-20 | Disposition: A | Discharge status: Home / Self Care | Payer: BLUE CROSS/BLUE SHIELD | Source: Ambulatory Visit | Attending: Family Medicine | Admitting: Family Medicine

## 2015-05-20 DIAGNOSIS — M48061 Spinal stenosis, lumbar region without neurogenic claudication: Secondary | ICD-10-CM

## 2015-05-22 ENCOUNTER — Ambulatory Visit: Payer: BLUE CROSS/BLUE SHIELD | Admitting: Family Medicine

## 2015-05-23 ENCOUNTER — Encounter: Payer: Self-pay | Admitting: Family Medicine

## 2015-05-29 ENCOUNTER — Encounter: Payer: Self-pay | Admitting: *Deleted

## 2015-05-29 DIAGNOSIS — M5416 Radiculopathy, lumbar region: Secondary | ICD-10-CM

## 2015-05-30 ENCOUNTER — Other Ambulatory Visit: Payer: Self-pay | Admitting: Family Medicine

## 2015-05-30 DIAGNOSIS — M5416 Radiculopathy, lumbar region: Secondary | ICD-10-CM

## 2015-06-02 ENCOUNTER — Other Ambulatory Visit: Payer: BLUE CROSS/BLUE SHIELD

## 2015-06-05 ENCOUNTER — Other Ambulatory Visit: Payer: Self-pay | Admitting: Family Medicine

## 2015-06-05 DIAGNOSIS — M5416 Radiculopathy, lumbar region: Secondary | ICD-10-CM

## 2015-06-06 ENCOUNTER — Ambulatory Visit: Payer: BLUE CROSS/BLUE SHIELD

## 2015-06-09 ENCOUNTER — Ambulatory Visit
Admission: RE | Admit: 2015-06-09 | Discharge: 2015-06-09 | Disposition: A | Discharge status: Home / Self Care | Payer: BLUE CROSS/BLUE SHIELD | Source: Ambulatory Visit | Attending: Family Medicine | Admitting: Family Medicine

## 2015-06-09 ENCOUNTER — Ambulatory Visit: Payer: BLUE CROSS/BLUE SHIELD | Admitting: Family Medicine

## 2015-06-09 ENCOUNTER — Ambulatory Visit: Payer: BLUE CROSS/BLUE SHIELD | Admitting: Obstetrics & Gynecology

## 2015-06-09 DIAGNOSIS — M5416 Radiculopathy, lumbar region: Secondary | ICD-10-CM

## 2015-06-09 MED ORDER — IOPAMIDOL (ISOVUE-M 200) INJECTION 41%
1.0000 mL | Freq: Once | INTRAMUSCULAR | Status: AC
  Administered 2015-06-09: 1 mL via EPIDURAL

## 2015-06-09 MED ORDER — METHYLPREDNISOLONE ACETATE 40 MG/ML INJ SUSP (RADIOLOG
120.0000 mg | Freq: Once | INTRAMUSCULAR | Status: AC
Start: 2015-06-09 — End: 2015-06-09
  Administered 2015-06-09: 120 mg via EPIDURAL

## 2015-06-09 NOTE — Discharge Instructions (Signed)

## 2015-06-12 ENCOUNTER — Other Ambulatory Visit: Payer: Self-pay | Admitting: Internal Medicine

## 2015-06-12 NOTE — Telephone Encounter (Signed)
Last office visit 05/02/2015. Flexeril removed from med. List 05/12/2015.

## 2015-06-16 ENCOUNTER — Ambulatory Visit (INDEPENDENT_AMBULATORY_CARE_PROVIDER_SITE_OTHER): Payer: BLUE CROSS/BLUE SHIELD | Admitting: Obstetrics & Gynecology

## 2015-06-16 ENCOUNTER — Encounter (HOSPITAL_COMMUNITY): Payer: Self-pay | Admitting: *Deleted

## 2015-06-16 VITALS — BP 138/96 | HR 66 | Temp 98.8°F | Wt 177.3 lb

## 2015-06-16 DIAGNOSIS — T192XXA Foreign body in vulva and vagina, initial encounter: Secondary | ICD-10-CM | POA: Diagnosis not present

## 2015-06-16 MED ORDER — ALBUTEROL SULFATE HFA 108 (90 BASE) MCG/ACT IN AERS: 1.0000 | INHALATION_SPRAY | Freq: Four times a day (QID) | RESPIRATORY_TRACT | Status: DC | PRN

## 2015-06-16 NOTE — Progress Notes (Signed)
[name], female   DOB: 07-04-1950, 65 y.o.   MRN: [id number] Pt reports that when she has intercourse that her partner "feels something" and pt reports bladder spasms that are very uncomfortable rating them at 8 out of 10.

## 2015-06-16 NOTE — Progress Notes (Signed)
   Subjective:    [name], female    DOB: 11/26/1950, 65 y.o.   MRN: [id number]  HPI  65 yo AA woman here because her parner can feel something at the top of her vaginal cuff sharp with the tip of her penis. Dr. Burke Keels did a vag hyst and sling years ago and used permanent suture. I removed a piece of mesh last year but this is a new problem.  Review of Systems     Objective:   Physical Exam [id number] Breathing, conversing, and ambulating normally I can feel a sharp piece of suture at the right hand side of the vaginal cuff but I am unable to see it without causing her undue pain.       Assessment & Plan:  Retained suture of vaginal cuff I will email [place] and get her scheduled to have this removed under anesthesia

## 2015-06-19 ENCOUNTER — Encounter: Payer: Self-pay | Admitting: Obstetrics & Gynecology

## 2015-06-22 NOTE — Patient Instructions (Addendum)
Your procedure is scheduled on:  Enter through the Main Entrance of [facility] at: Thursday, 5/25  Pick up the phone at the desk and dial [phone].  Call this number if you have problems the morning of surgery: [phone].  Remember: Do NOT eat food: after midnight Wednesday 5/24 Do NOT drink clear liquids after 8am Thursday, 5/25 Take these medicines the morning of surgery with a SIP OF WATER:  Prozac, gabapentin, lisinapril-hctz.   Bring albuterol inhaler with you on day of surgery.  Do NOT wear jewelry (body piercing), metal hair clips/bobby pins, make-up, or nail polish. Do NOT wear lotions, powders, or perfumes.  You may wear deoderant. Do NOT shave for 48 hours prior to surgery. Do NOT bring valuables to the hospital. Contacts, dentures, or bridgework may not be worn into surgery.  Have a responsible adult drive you home and stay with you for 24 hours after your procedure.

## 2015-06-23 ENCOUNTER — Inpatient Hospital Stay (HOSPITAL_COMMUNITY)
Admission: RE | Admit: 2015-06-23 | Discharge: 2015-06-23 | Disposition: A | Discharge status: Home / Self Care | Payer: BLUE CROSS/BLUE SHIELD | Source: Ambulatory Visit

## 2015-06-26 ENCOUNTER — Encounter (HOSPITAL_COMMUNITY): Payer: Self-pay

## 2015-06-26 ENCOUNTER — Other Ambulatory Visit: Payer: Self-pay

## 2015-06-26 ENCOUNTER — Encounter (HOSPITAL_COMMUNITY)
Admission: RE | Admit: 2015-06-26 | Discharge: 2015-06-26 | Disposition: A | Discharge status: Home / Self Care | Payer: BLUE CROSS/BLUE SHIELD | Source: Ambulatory Visit | Attending: Obstetrics & Gynecology | Admitting: Obstetrics & Gynecology

## 2015-06-26 DIAGNOSIS — T192XXA Foreign body in vulva and vagina, initial encounter: Secondary | ICD-10-CM | POA: Insufficient documentation

## 2015-06-26 DIAGNOSIS — E785 Hyperlipidemia, unspecified: Secondary | ICD-10-CM | POA: Diagnosis not present

## 2015-06-26 DIAGNOSIS — Z79899 Other long term (current) drug therapy: Secondary | ICD-10-CM | POA: Diagnosis not present

## 2015-06-26 DIAGNOSIS — X58XXXA Exposure to other specified factors, initial encounter: Secondary | ICD-10-CM | POA: Insufficient documentation

## 2015-06-26 DIAGNOSIS — Z87891 Personal history of nicotine dependence: Secondary | ICD-10-CM | POA: Insufficient documentation

## 2015-06-26 DIAGNOSIS — Z01812 Encounter for preprocedural laboratory examination: Secondary | ICD-10-CM | POA: Insufficient documentation

## 2015-06-26 DIAGNOSIS — F329 Major depressive disorder, single episode, unspecified: Secondary | ICD-10-CM | POA: Diagnosis not present

## 2015-06-26 DIAGNOSIS — I10 Essential (primary) hypertension: Secondary | ICD-10-CM | POA: Insufficient documentation

## 2015-06-26 DIAGNOSIS — F319 Bipolar disorder, unspecified: Secondary | ICD-10-CM | POA: Diagnosis not present

## 2015-06-26 DIAGNOSIS — Z01810 Encounter for preprocedural cardiovascular examination: Secondary | ICD-10-CM | POA: Diagnosis present

## 2015-06-26 DIAGNOSIS — K219 Gastro-esophageal reflux disease without esophagitis: Secondary | ICD-10-CM | POA: Diagnosis not present

## 2015-06-26 HISTORY — DX: Other allergic rhinitis: J30.89

## 2015-06-26 HISTORY — DX: Complete loss of teeth, unspecified cause, unspecified class: K08.109

## 2015-06-26 HISTORY — DX: Spinal stenosis, site unspecified: M48.00

## 2015-06-26 HISTORY — DX: Anemia, unspecified: D64.9

## 2015-06-26 HISTORY — DX: Unspecified osteoarthritis, unspecified site: M19.90

## 2015-06-26 HISTORY — DX: Bipolar disorder, unspecified: F31.9

## 2015-06-26 HISTORY — DX: Complete loss of teeth, unspecified cause, unspecified class: Z97.2

## 2015-06-26 LAB — BASIC METABOLIC PANEL
ANION GAP: 9 (ref 5–15)
BUN: 10 mg/dL (ref 6–20)
CO2: 27 mmol/L (ref 22–32)
Calcium: 9.6 mg/dL (ref 8.9–10.3)
Chloride: 104 mmol/L (ref 101–111)
Creatinine, Ser: 0.96 mg/dL (ref 0.44–1.00)
GFR calc Af Amer: >60 mL/min (ref >60)
GFR calc non Af Amer: >60 mL/min (ref >60)
Glucose, Bld: 94 mg/dL (ref 65–99)
POTASSIUM: 4 mmol/L (ref 3.5–5.1)
Sodium: 140 mmol/L (ref 135–145)

## 2015-06-26 LAB — TYPE AND SCREEN
ABO/RH(D): A NEG
Antibody Screen: NEGATIVE

## 2015-06-26 LAB — CBC
HEMATOCRIT: 34.2 % — AB (ref 36.0–46.0)
HEMOGLOBIN: 10.8 g/dL — AB (ref 12.0–15.0)
MCH: 26.8 pg (ref 26.0–34.0)
MCHC: 31.6 g/dL (ref 30.0–36.0)
MCV: 84.9 fL (ref 78.0–100.0)
Platelets: 294 10*3/uL (ref 150–400)
RBC: 4.03 MIL/uL (ref 3.87–5.11)
RDW: 14.4 % (ref 11.5–15.5)
WBC: 5.8 10*3/uL (ref 4.0–10.5)

## 2015-06-26 NOTE — Patient Instructions (Addendum)
Your procedure is scheduled on: Thursday, 5/25 Enter through the Main Entrance of [facility] at: [place] up the phone at the desk and dial [phone].  Call this number if you have problems the morning of surgery: [phone].  Remember: Do NOT eat food after midnight Wednesday, 5/24 Do NOT drink clear liquids after 8 am Thursday, day of surgery Take these medicines the morning of surgery with a SIP OF WATER: prozac, gabapentin, lisinopril-hctz.  Bring albuterol inhaler with you on day of surgery.  Do NOT wear jewelry (body piercing), metal hair clips/bobby pins, make-up, or nail polish. Do NOT wear lotions, powders, or perfumes.  You may wear deoderant. Do NOT shave for 48 hours prior to surgery. Do NOT bring valuables to the hospital. Dentures may not be worn into surgery.  Have a responsible adult drive you home and stay with you for 24 hours after your procedure.  Home with daughter Bebe Shaggy cell [phone] or son Jenny Reichmann cell [phone]

## 2015-07-06 ENCOUNTER — Ambulatory Visit (HOSPITAL_COMMUNITY)
Admission: AD | Admit: 2015-07-06 | Discharge: 2015-07-06 | Disposition: A | Discharge status: Home / Self Care | Payer: BLUE CROSS/BLUE SHIELD | Source: Ambulatory Visit | Attending: Obstetrics & Gynecology | Admitting: Obstetrics & Gynecology

## 2015-07-06 ENCOUNTER — Ambulatory Visit (HOSPITAL_COMMUNITY): Payer: BLUE CROSS/BLUE SHIELD | Admitting: Anesthesiology

## 2015-07-06 ENCOUNTER — Encounter (HOSPITAL_COMMUNITY)
Admission: AD | Disposition: A | Discharge status: Home / Self Care | Payer: Self-pay | Source: Ambulatory Visit | Attending: Obstetrics & Gynecology

## 2015-07-06 ENCOUNTER — Encounter (HOSPITAL_COMMUNITY): Payer: Self-pay | Admitting: Emergency Medicine

## 2015-07-06 DIAGNOSIS — Z87891 Personal history of nicotine dependence: Secondary | ICD-10-CM | POA: Diagnosis not present

## 2015-07-06 DIAGNOSIS — K219 Gastro-esophageal reflux disease without esophagitis: Secondary | ICD-10-CM | POA: Diagnosis not present

## 2015-07-06 DIAGNOSIS — E785 Hyperlipidemia, unspecified: Secondary | ICD-10-CM | POA: Diagnosis not present

## 2015-07-06 DIAGNOSIS — T81590S Other complications of foreign body accidentally left in body following surgical operation, sequela: Secondary | ICD-10-CM | POA: Diagnosis not present

## 2015-07-06 DIAGNOSIS — Z4802 Encounter for removal of sutures: Secondary | ICD-10-CM | POA: Diagnosis present

## 2015-07-06 DIAGNOSIS — R102 Pelvic and perineal pain: Secondary | ICD-10-CM | POA: Diagnosis not present

## 2015-07-06 DIAGNOSIS — I10 Essential (primary) hypertension: Secondary | ICD-10-CM | POA: Insufficient documentation

## 2015-07-06 DIAGNOSIS — Z79899 Other long term (current) drug therapy: Secondary | ICD-10-CM | POA: Insufficient documentation

## 2015-07-06 DIAGNOSIS — Z981 Arthrodesis status: Secondary | ICD-10-CM | POA: Diagnosis not present

## 2015-07-06 DIAGNOSIS — Z9851 Tubal ligation status: Secondary | ICD-10-CM | POA: Insufficient documentation

## 2015-07-06 DIAGNOSIS — F319 Bipolar disorder, unspecified: Secondary | ICD-10-CM | POA: Diagnosis not present

## 2015-07-06 DIAGNOSIS — Z90710 Acquired absence of both cervix and uterus: Secondary | ICD-10-CM | POA: Diagnosis not present

## 2015-07-06 SURGERY — EXAM UNDER ANESTHESIA
Anesthesia: Monitor Anesthesia Care | Site: Vagina

## 2015-07-06 MED ORDER — MIDAZOLAM HCL 2 MG/2ML IJ SOLN
INTRAMUSCULAR | Status: AC
  Filled 2015-07-06: qty 2

## 2015-07-06 MED ORDER — LIDOCAINE HCL (CARDIAC) 20 MG/ML IV SOLN
INTRAVENOUS | Status: AC
  Filled 2015-07-06: qty 5

## 2015-07-06 MED ORDER — ONDANSETRON HCL 4 MG/2ML IJ SOLN
4.0000 mg | Freq: Once | INTRAMUSCULAR | Status: AC
  Administered 2015-07-06: 4 mg via INTRAVENOUS

## 2015-07-06 MED ORDER — PROPOFOL 10 MG/ML IV BOLUS
INTRAVENOUS | Status: DC | PRN
  Administered 2015-07-06: 170 mg via INTRAVENOUS

## 2015-07-06 MED ORDER — SCOPOLAMINE 1 MG/3DAYS TD PT72: 1.0000 | MEDICATED_PATCH | Freq: Once | TRANSDERMAL | Status: DC

## 2015-07-06 MED ORDER — KETOROLAC TROMETHAMINE 30 MG/ML IJ SOLN
INTRAMUSCULAR | Status: DC | PRN
  Administered 2015-07-06: 30 mg via INTRAVENOUS

## 2015-07-06 MED ORDER — KETOROLAC TROMETHAMINE 30 MG/ML IJ SOLN
INTRAMUSCULAR | Status: AC
  Filled 2015-07-06: qty 1

## 2015-07-06 MED ORDER — LACTATED RINGERS IV SOLN
INTRAVENOUS | Status: DC
  Administered 2015-07-06: 11:00:00 via INTRAVENOUS

## 2015-07-06 MED ORDER — FENTANYL CITRATE (PF) 100 MCG/2ML IJ SOLN
INTRAMUSCULAR | Status: AC
  Filled 2015-07-06: qty 2

## 2015-07-06 MED ORDER — PROPOFOL 10 MG/ML IV BOLUS
INTRAVENOUS | Status: AC
  Filled 2015-07-06: qty 20

## 2015-07-06 MED ORDER — ONDANSETRON HCL 4 MG/2ML IJ SOLN
INTRAMUSCULAR | Status: AC
  Filled 2015-07-06: qty 2

## 2015-07-06 MED ORDER — LACTATED RINGERS IV SOLN: INTRAVENOUS | Status: DC

## 2015-07-06 MED ORDER — LIDOCAINE HCL (CARDIAC) 20 MG/ML IV SOLN
INTRAVENOUS | Status: DC | PRN
  Administered 2015-07-06: 60 mg via INTRAVENOUS

## 2015-07-06 SURGICAL SUPPLY — 7 items
CLOTH BEACON ORANGE TIMEOUT ST (SAFETY) ×3 IMPLANT
COVER BACK TABLE 60X90IN (DRAPES) ×3 IMPLANT
GLOVE BIO SURGEON STRL SZ 6.5 (GLOVE) ×2 IMPLANT
GLOVE BIO SURGEONS STRL SZ 6.5 (GLOVE) ×1
GLOVE BIOGEL PI IND STRL 7.0 (GLOVE) ×1 IMPLANT
GLOVE BIOGEL PI INDICATOR 7.0 (GLOVE) ×2
GOWN STRL REUS W/TWL LRG LVL3 (GOWN DISPOSABLE) ×6 IMPLANT

## 2015-07-06 NOTE — H&P (Signed)
Olivia Ewing is an 65 yo AA woman here because her parner can feel something at the top of her vaginal cuff sharp with the tip of her penis. Dr. Burke Keels did a vag hyst and sling years ago and used permanent suture. I removed a piece of mesh last year but this is a new problem. I was able to feel and even see a small bit of permanent suture at the right hand side of the vaginal cuff, but was unable to remove it in the office due to patient discomfort.   No LMP recorded. Patient is postmenopausal.    Past Medical History  Diagnosis Date  . Hyperlipidemia     diet controlled, no meds  . Hypertension   . Depression   . Right knee pain     posterior horn medial meniscal tear MRI 2013  . Lumbar radiculopathy, chronic   . Cervicalgia   . Environmental allergies     cause SOB, uses inhaler for  . SVD (spontaneous vaginal delivery)     x 4  . Environmental and seasonal allergies     uses inhaler prn  . Bipolar disorder ([place])   . GERD (gastroesophageal reflux disease)     diet controlled - no meds  . Arthritis     knees hands  . Anemia   . Spinal stenosis     getting epidural injections -last one 06/12/2015  . Full dentures     Past Surgical History  Procedure Laterality Date  . Hysterectomy other    . Bladder tack  10/2006    cystocoele  . Appendectomy    . Breast surgery Right     benign cyst  . Colonoscopy    . Tubal ligation      Family History  Problem Relation Age of Onset  . Hypertension Mother     Social History:  reports that she quit smoking about 31 years ago. Her smoking use included Cigarettes. She smoked 0.10 packs per day. She has never used smokeless tobacco. She reports that she drinks alcohol. She reports that she does not use illicit drugs.  Allergies: No Known Allergies  Prescriptions prior to admission  Medication Sig Dispense Refill Last Dose  . albuterol (PROVENTIL HFA;VENTOLIN HFA) 108 (90 Base) MCG/ACT inhaler Inhale 1-2 puffs into the lungs  every 6 (six) hours as needed for wheezing or shortness of breath. (Patient taking differently: Inhale 1-2 puffs into the lungs every 6 (six) hours as needed for wheezing or shortness of breath (related to seasonal and environmental allergies). ) 1 Inhaler 12 Past Week at Unknown time  . cyclobenzaprine (FLEXERIL) 5 MG tablet TAKE 1 TABLET BY MOUTH EVERY 8 HOURS AS NEEDED FOR MUSCLE SPASM 30 tablet 1 Past Week at Unknown time  . ferrous sulfate 325 (65 FE) MG tablet Take 325 mg by mouth daily with breakfast.     . FLUoxetine (PROZAC) 20 MG capsule Take 20 mg by mouth daily.     07/06/2015 at Unknown time  . gabapentin (NEURONTIN) 300 MG capsule Take 2 tabs three times a day (Patient taking differently: Take 300 mg by mouth 3 (three) times daily. Take 2 tabs three times a day) 180 capsule 3 Past Week at Unknown time  . lisinopril-hydrochlorothiazide (PRINZIDE,ZESTORETIC) 20-12.5 MG tablet Take 1 tablet by mouth daily.   07/06/2015 at Unknown time  . Multiple Vitamin (MULTIVITAMIN WITH MINERALS) TABS tablet Take 1 tablet by mouth daily.     . QUEtiapine (SEROQUEL) 50 MG tablet  Take 50 mg by mouth at bedtime.   07/05/2015 at Unknown time  . vitamin E 400 UNIT capsule Take 400 Units by mouth daily.     . predniSONE (DELTASONE) 10 MG tablet Take 6 tabs a day for 7 days (Patient not taking: Reported on 06/22/2015) 42 tablet 0     ROS  Blood pressure 123/93, pulse 80, temperature 99 F (37.2 C), temperature source Oral, resp. rate 16. Physical Exam Heart- rrr Lungs- CTAB Abd- benign No results found for this or any previous visit (from the past 24 hour(s)).  No results found.  Assessment/Plan: Foreign body in vaginal cuff, causing discomfort Plan for removal under anesthesia  Olivia Ewing C. 07/06/2015, 10:33 AM

## 2015-07-06 NOTE — Transfer of Care (Signed)
Immediate Anesthesia Transfer of Care Note  Patient: Olivia Ewing  Procedure(s) Performed: Procedure(s): EXAM UNDER ANESTHESIA (N/A)  Patient Location: PACU  Anesthesia Type:General  Level of Consciousness: awake  Airway & Oxygen Therapy: Patient Spontanous Breathing  Post-op Assessment: Report given to PACU RN  Post vital signs: stable  Filed Vitals:   07/06/15 1017  BP: 123/93  Pulse: 80  Temp: 37.2 C  Resp: 16    Complications: No apparent anesthesia complications

## 2015-07-06 NOTE — Anesthesia Postprocedure Evaluation (Signed)
Anesthesia Post Note  Patient: Olivia Ewing  Procedure(s) Performed: Procedure(s) (LRB): EXAM UNDER ANESTHESIA (N/A)  Patient location during evaluation: PACU Anesthesia Type: General Level of consciousness: awake and alert Pain management: pain level controlled Vital Signs Assessment: post-procedure vital signs reviewed and stable Respiratory status: spontaneous breathing, nonlabored ventilation, respiratory function stable and patient connected to nasal cannula oxygen Cardiovascular status: blood pressure returned to baseline and stable Postop Assessment: no signs of nausea or vomiting Anesthetic complications: no     Last Vitals:  Filed Vitals:   07/06/15 1130 07/06/15 1145  BP: 137/90 131/94  Pulse: 65 61  Temp:    Resp: 19 15    Last Pain:  Filed Vitals:   07/06/15 1203  PainSc: 0-No pain   Pain Goal: Patients Stated Pain Goal: 8 (07/06/15 1145)               Sarrah Fiorenza L

## 2015-07-06 NOTE — Anesthesia Preprocedure Evaluation (Addendum)
Anesthesia Evaluation  Patient identified by MRN, date of birth, ID band Patient awake    Reviewed: Allergy & Precautions, H&P , NPO status , Patient's Chart, lab work & pertinent test results  Airway Mallampati: II  TM Distance: >3 FB Neck ROM: full    Dental  (+) Dental Advisory Given, Edentulous Upper, Edentulous Lower   Pulmonary neg pulmonary ROS, former smoker,    Pulmonary exam normal breath sounds clear to auscultation       Cardiovascular Exercise Tolerance: Good hypertension, negative cardio ROS Normal cardiovascular exam Rhythm:regular Rate:Normal     Neuro/Psych Depression Bipolar Disorder negative neurological ROS  negative psych ROS   GI/Hepatic negative GI ROS, Neg liver ROS,   Endo/Other  negative endocrine ROS  Renal/GU negative Renal ROS  negative genitourinary   Musculoskeletal   Abdominal   Peds  Hematology negative hematology ROS (+) anemia ,   Anesthesia Other Findings   Reproductive/Obstetrics negative OB ROS                            Anesthesia Physical Anesthesia Plan  ASA: II  Anesthesia Plan: MAC   Post-op Pain Management:    Induction:   Airway Management Planned:   Additional Equipment:   Intra-op Plan:   Post-operative Plan:   Informed Consent: I have reviewed the patients History and Physical, chart, labs and discussed the procedure including the risks, benefits and alternatives for the proposed anesthesia with the patient or authorized representative who has indicated his/her understanding and acceptance.   Dental Advisory Given  Plan Discussed with: CRNA and Surgeon  Anesthesia Plan Comments:        Anesthesia Quick Evaluation

## 2015-07-06 NOTE — Op Note (Signed)
07/06/2015  11:24 AM  PATIENT:  Olivia Ewing  65 y.o. female  PRE-OPERATIVE DIAGNOSIS:  Retained SUTURE OF VAGINAL CUFF  POST-OPERATIVE DIAGNOSIS:  Retained suture of vaginal cuff  PROCEDURE:  Procedure(s): EXAM UNDER ANESTHESIA (N/A), removal of permanent suture  SURGEON:  Surgeon(s) and Role:    * Emily Filbert, MD - Primary   ANESTHESIA:   MAC  EBL:  Total I/O In: 300 [I.V.:300] Out: 0   BLOOD ADMINISTERED:none  DRAINS: none   LOCAL MEDICATIONS USED:  NONE  SPECIMEN:  No Specimen  DISPOSITION OF SPECIMEN:  N/A  COUNTS:  YES  TOURNIQUET:  * No tourniquets in log *  DICTATION: .Dragon Dictation  PLAN OF CARE: Discharge to home after PACU  PATIENT DISPOSITION:  PACU - hemodynamically stable.   Delay start of Pharmacological VTE agent (>24hrs) due to surgical blood loss or risk of bleeding: not applicable  In the operating room, a time out procedure was done after anesthesia was given. She was placed in the dorsal lithotomy position. I placed a Graves speculum and grasped the suture. I was then able to cut and remove it. She was taken to the recovery room in stable condition.

## 2015-07-06 NOTE — Discharge Instructions (Addendum)

## 2015-07-24 ENCOUNTER — Encounter: Payer: Self-pay | Admitting: *Deleted

## 2015-07-28 ENCOUNTER — Ambulatory Visit: Payer: BLUE CROSS/BLUE SHIELD

## 2015-08-09 ENCOUNTER — Encounter: Payer: Self-pay | Admitting: *Deleted

## 2015-08-09 ENCOUNTER — Other Ambulatory Visit: Payer: Self-pay | Admitting: Family Medicine

## 2015-08-09 DIAGNOSIS — M5416 Radiculopathy, lumbar region: Secondary | ICD-10-CM

## 2015-08-17 ENCOUNTER — Other Ambulatory Visit: Payer: BLUE CROSS/BLUE SHIELD

## 2015-08-18 ENCOUNTER — Encounter: Payer: Self-pay | Admitting: Internal Medicine

## 2015-08-18 ENCOUNTER — Ambulatory Visit: Payer: BLUE CROSS/BLUE SHIELD

## 2015-08-25 ENCOUNTER — Ambulatory Visit
Admission: RE | Admit: 2015-08-25 | Discharge: 2015-08-25 | Disposition: A | Discharge status: Home / Self Care | Payer: No Typology Code available for payment source | Source: Ambulatory Visit | Attending: Family Medicine | Admitting: Family Medicine

## 2015-08-25 DIAGNOSIS — M5416 Radiculopathy, lumbar region: Secondary | ICD-10-CM

## 2015-08-25 MED ORDER — IOPAMIDOL (ISOVUE-M 200) INJECTION 41%
1.0000 mL | Freq: Once | INTRAMUSCULAR | Status: AC
  Administered 2015-08-25: 1 mL via EPIDURAL

## 2015-08-25 MED ORDER — METHYLPREDNISOLONE ACETATE 40 MG/ML INJ SUSP (RADIOLOG
120.0000 mg | Freq: Once | INTRAMUSCULAR | Status: AC
  Administered 2015-08-25: 120 mg via EPIDURAL

## 2016-01-24 ENCOUNTER — Other Ambulatory Visit: Payer: Self-pay | Admitting: Internal Medicine

## 2016-01-25 NOTE — Telephone Encounter (Signed)
Spoke to patient scheduled appt

## 2016-01-25 NOTE — Telephone Encounter (Addendum)
Called patient but no answer, left message to call back. Patients needs appt w/

## 2016-03-31 NOTE — Assessment & Plan Note (Deleted)
Followed by Sports Medicine.  Current medications:

## 2016-03-31 NOTE — Progress Notes (Deleted)
   CC: ***  HPI:  Ms.Tonja Darreld Mclean Lauinger is a 66 y.o. woman with HTN, HL, and depression who presents for management of hypertension.  Please see A&P for status of the patient's chronic medical conditions.   Past Medical History:  Diagnosis Date  . Anemia   . Arthritis    knees hands  . Bipolar disorder ([place])   . Cervicalgia   . Depression   . Environmental allergies    cause SOB, uses inhaler for  . Environmental and seasonal allergies    uses inhaler prn  . Full dentures   . GERD (gastroesophageal reflux disease)    diet controlled - no meds  . Hyperlipidemia    diet controlled, no meds  . Hypertension   . Lumbar radiculopathy, chronic   . Right knee pain    posterior horn medial meniscal tear MRI 2013  . Spinal stenosis    getting epidural injections -last one 06/12/2015  . SVD (spontaneous vaginal delivery)    x 4    Review of Systems:  ROS   Physical Exam:  There were no vitals filed for this visit. Physical Exam  Assessment & Plan:   See Encounters Tab for problem based charting.  Patient {GC/GE:[id number]::"discussed with","seen with"} Dr. {NAMES:[id number]::"Butcher","Gran[place]ortuna","E. Hoffman","Klima","Mullen","Narendra","Vincent"}

## 2016-03-31 NOTE — Assessment & Plan Note (Deleted)
BP Readings from Last 3 Encounters:  08/25/15 (!) 169/99  07/06/15 (!) 145/87  06/26/15 (!) 152/99   Lab Results  Component Value Date   CREATININE 0.96 06/26/2015   Lab Results  Component Value Date   K 4.0 06/26/2015    Current medications: lisinopril-HCTZ 20-12.5 mg daily  Assessment BP goal: <140/90 BP control:  Plan Medications: Other: -discussed diet, exercise, and weight loss

## 2016-03-31 NOTE — Assessment & Plan Note (Deleted)
  Depression screen [facility] 2/9 05/12/2015 05/02/2015 06/14/2014 11/25/2013 09/30/2013  Decreased Interest 0 2 3 0 0  Down, Depressed, Hopeless 0 2 3 0 0  PHQ - 2 Score 0 4 6 0 0  Altered sleeping 0 2 0 - -  Tired, decreased energy 0 2 3 - -  Change in appetite 0 0 0 - -  Feeling bad or failure about yourself  0 0 0 - -  Trouble concentrating 0 3 0 - -  Moving slowly or fidgety/restless 0 3 3 - -  Suicidal thoughts 0 0 0 - -  PHQ-9 Score 0 14 12 - -  Difficult doing work/chores - Not difficult at all Somewhat difficult - -   Current medications: fluoxetine 20 mg daily, seroquel 50 mg QHS  Previous medications:  Follows with [facility] for mental health care.  Assessment   Plan  Medications:  Other:

## 2016-04-01 ENCOUNTER — Encounter: Payer: Self-pay | Admitting: Internal Medicine

## 2016-07-19 ENCOUNTER — Encounter: Payer: Self-pay | Admitting: *Deleted

## 2016-08-19 ENCOUNTER — Other Ambulatory Visit: Payer: Self-pay

## 2016-08-19 NOTE — Telephone Encounter (Signed)
lisinopril-hydrochlorothiazide (PRINZIDE,ZESTORETIC) 20-12.5 MG tablet, REFILL REQUEST @ [facility].

## 2016-08-22 MED ORDER — LISINOPRIL-HYDROCHLOROTHIAZIDE 20-12.5 MG PO TABS: 1.0000 | ORAL_TABLET | Freq: Every day | ORAL | 0 refills | Status: DC

## 2016-08-22 NOTE — Telephone Encounter (Signed)
Per [facility] , pt has an appt scheduled 09/30/16.

## 2016-08-30 ENCOUNTER — Ambulatory Visit: Payer: Self-pay

## 2016-09-16 ENCOUNTER — Ambulatory Visit: Payer: Self-pay

## 2016-09-30 ENCOUNTER — Encounter: Payer: Self-pay | Admitting: Internal Medicine

## 2016-12-24 ENCOUNTER — Ambulatory Visit (INDEPENDENT_AMBULATORY_CARE_PROVIDER_SITE_OTHER): Payer: Medicare HMO | Admitting: Sports Medicine

## 2016-12-24 VITALS — BP 132/90 | Ht 65.0 in | Wt 175.0 lb

## 2016-12-24 DIAGNOSIS — M48061 Spinal stenosis, lumbar region without neurogenic claudication: Secondary | ICD-10-CM | POA: Diagnosis not present

## 2016-12-24 DIAGNOSIS — M5416 Radiculopathy, lumbar region: Secondary | ICD-10-CM

## 2016-12-24 MED ORDER — CYCLOBENZAPRINE HCL 5 MG PO TABS
5.0000 mg | ORAL_TABLET | Freq: Three times a day (TID) | ORAL | 1 refills | Status: DC | PRN
Start: 2016-12-24 — End: 2017-04-01

## 2016-12-24 MED ORDER — HYDROCODONE-ACETAMINOPHEN 5-325 MG PO TABS: 1.0000 | ORAL_TABLET | Freq: Four times a day (QID) | ORAL | 0 refills | Status: DC | PRN

## 2016-12-24 MED ORDER — CYCLOBENZAPRINE HCL 5 MG PO TABS: 5.0000 mg | ORAL_TABLET | Freq: Three times a day (TID) | ORAL | 1 refills | Status: DC | PRN

## 2016-12-24 MED FILL — HYDROCODON-APAP 5-325: 5-325 | 3 days supply | Qty: 10 | Fill #0

## 2016-12-24 NOTE — Assessment & Plan Note (Signed)
MRI of the lumbar spine performed 05/20/2015 showed mild subarticular and foraminal narrowing at L1-2, L2-3, and L3-4, as well as moderate to severe central and bilateral foraminal stenosis at L4-5. She also has multilevel disc protrusions. Patient has had success with epidural injections in the past. - Epidural injections ordered to be performed at [place] - Patient given Flexeril 5 mg tid prn and Norco 5-325mg  #10 to use for pain until injection is performed. Patient understands that she cannot get any additional narcotic refills through this clinic - Referral to pain clinic, per patient request

## 2016-12-24 NOTE — Progress Notes (Signed)
   [facility] Phone: [phone]  Subjective:  Olivia Ewing is a 66 year old female presenting to clinic for follow-up of low back pain. She has had low back pain with right-sided radiculopathy for several years. Over the last 2 months, she has developed pain shooting down the lateral and posterior left thigh to her knee. The pain feels "sharp" and feels like someone is "snatching her back when she walks". The pain is worse with walking downhill and standing straight up. The pain improves with bending forward. She also notes numbness and tingling of her left lateral thigh. She has tried Tbuprofen and Tylenol, which has not helped. She also tried taking Olivia Ewing of her sister's Vicodin and Flexeril, which helped. She was seen in clinic on 05/12/2015 with low back pain and right-sided radiculopathy. She was given a steroid burst and gabapentin at that time, which helped a little bit. She has also received epidural injections in the past, which has helped greatly. She is interested in getting epidural injections again. She denies any saddle anesthesia. She denies bowel incontinence, but endorses urinary incontinence. She has had issues with this for several years.  ROS: See HPI for pertinent positives and negatives  Objective: BP 132/90   Ht 5\' 5"  (1.651 m)   Wt 175 lb (79.4 kg)   BMI 29.12 kg/m  Gen: NAD, alert, cooperative with exam Back: No gross deformity noted. Decreased range of motion due to pain in all directions. She has midline tenderness along the entire lumbar spine. She also has tenderness of the bilateral lumbar paraspinal muscles. She has tenderness over the SI joints bilaterally. She has tenderness over the gluteus and piriformis muscles. Hips: Full range of motion bilaterally. She has pain in her back with external rotation. No pain with internal rotation. Neuro: Walks with back in the flexed position, 5/5 strength in the lower extremities bilaterally, decreased sensation  to light touch of the lateral thighs bilaterally, reflexes blunted and symmetric.  Assessment/Plan: Lumbar Spinal Stenosis with Left Sided Radiculopathy: MRI of the lumbar spine performed 05/20/2015 showed mild subarticular and foraminal narrowing at L1-2, L2-3, and L3-4, as well as moderate to severe central and bilateral foraminal stenosis at L4-5. She also has multilevel disc protrusions. Patient has had success with epidural injections in the past. - Epidural injections ordered to be performed at [place] - Patient given Flexeril 5 mg tid prn and Norco 5-325mg  #10 to use for pain until injection is performed. Patient understands that she cannot get any additional narcotic refills through this clinic - Referral to pain clinic, per patient request   Hyman Bible, MD PGY-3  Patient seen and evaluated with the resident. I agree with the above plan of care. Patient will proceed with an epidural steroid injection at [facility] imaging. She would like to be referred to a pain clinic as well. Follow-up with me as needed.

## 2016-12-25 ENCOUNTER — Other Ambulatory Visit: Payer: Self-pay | Admitting: Sports Medicine

## 2016-12-25 ENCOUNTER — Encounter: Payer: Self-pay | Admitting: Sports Medicine

## 2016-12-25 DIAGNOSIS — M5416 Radiculopathy, lumbar region: Secondary | ICD-10-CM

## 2016-12-31 ENCOUNTER — Ambulatory Visit
Admission: RE | Admit: 2016-12-31 | Discharge: 2016-12-31 | Disposition: A | Discharge status: Home / Self Care | Payer: Medicare HMO | Source: Ambulatory Visit | Attending: Sports Medicine | Admitting: Sports Medicine

## 2016-12-31 DIAGNOSIS — M5416 Radiculopathy, lumbar region: Secondary | ICD-10-CM

## 2016-12-31 MED ORDER — IOPAMIDOL (ISOVUE-M 200) INJECTION 41%
1.0000 mL | Freq: Once | INTRAMUSCULAR | Status: AC
  Administered 2016-12-31: 1 mL via EPIDURAL

## 2016-12-31 MED ORDER — METHYLPREDNISOLONE ACETATE 40 MG/ML INJ SUSP (RADIOLOG
120.0000 mg | Freq: Once | INTRAMUSCULAR | Status: AC
  Administered 2016-12-31: 120 mg via EPIDURAL

## 2016-12-31 NOTE — Discharge Instructions (Signed)

## 2017-01-30 ENCOUNTER — Other Ambulatory Visit: Payer: Self-pay | Admitting: Internal Medicine

## 2017-01-30 ENCOUNTER — Other Ambulatory Visit: Payer: Self-pay | Admitting: *Deleted

## 2017-01-30 DIAGNOSIS — M5416 Radiculopathy, lumbar region: Secondary | ICD-10-CM

## 2017-02-10 ENCOUNTER — Other Ambulatory Visit: Payer: Self-pay | Admitting: Sports Medicine

## 2017-02-10 DIAGNOSIS — M5416 Radiculopathy, lumbar region: Secondary | ICD-10-CM

## 2017-02-17 ENCOUNTER — Encounter: Payer: Self-pay | Admitting: *Deleted

## 2017-02-17 ENCOUNTER — Telehealth: Payer: Self-pay | Admitting: Sports Medicine

## 2017-02-17 NOTE — Telephone Encounter (Signed)
Patient needs you to call her back about faxing some paperwork for her, to a pain mgmt clinic.  [phone]

## 2017-02-17 NOTE — Progress Notes (Signed)
Patients mailbox was full when I returned the call.

## 2017-02-19 ENCOUNTER — Ambulatory Visit
Admission: RE | Admit: 2017-02-19 | Discharge: 2017-02-19 | Disposition: A | Discharge status: Home / Self Care | Payer: Medicare HMO | Source: Ambulatory Visit | Attending: Sports Medicine | Admitting: Sports Medicine

## 2017-02-19 DIAGNOSIS — M5416 Radiculopathy, lumbar region: Secondary | ICD-10-CM

## 2017-02-19 MED ORDER — METHYLPREDNISOLONE ACETATE 40 MG/ML INJ SUSP (RADIOLOG: 120.0000 mg | Freq: Once | INTRAMUSCULAR | Status: DC

## 2017-02-19 MED ORDER — IOPAMIDOL (ISOVUE-M 200) INJECTION 41%: 1.0000 mL | Freq: Once | INTRAMUSCULAR | Status: DC

## 2017-02-19 NOTE — Discharge Instructions (Signed)

## 2017-02-20 ENCOUNTER — Ambulatory Visit (INDEPENDENT_AMBULATORY_CARE_PROVIDER_SITE_OTHER): Payer: Medicare HMO | Admitting: Internal Medicine

## 2017-02-20 VITALS — BP 157/85 | HR 70 | Temp 98.1°F | Wt 189.7 lb

## 2017-02-20 DIAGNOSIS — M549 Dorsalgia, unspecified: Secondary | ICD-10-CM

## 2017-02-20 DIAGNOSIS — Z87891 Personal history of nicotine dependence: Secondary | ICD-10-CM

## 2017-02-20 DIAGNOSIS — I10 Essential (primary) hypertension: Secondary | ICD-10-CM

## 2017-02-20 DIAGNOSIS — H0014 Chalazion left upper eyelid: Secondary | ICD-10-CM | POA: Diagnosis not present

## 2017-02-20 DIAGNOSIS — Z79899 Other long term (current) drug therapy: Secondary | ICD-10-CM | POA: Diagnosis not present

## 2017-02-20 MED ORDER — LISINOPRIL-HYDROCHLOROTHIAZIDE 20-12.5 MG PO TABS: 1.0000 | ORAL_TABLET | Freq: Every day | ORAL | 0 refills | Status: DC

## 2017-02-20 MED ORDER — ALBUTEROL SULFATE HFA 108 (90 BASE) MCG/ACT IN AERS: 1.0000 | INHALATION_SPRAY | Freq: Four times a day (QID) | RESPIRATORY_TRACT | 1 refills | Status: DC | PRN

## 2017-02-20 MED ORDER — FERROUS SULFATE 325 (65 FE) MG PO TABS: 325.0000 mg | ORAL_TABLET | Freq: Every day | ORAL | 0 refills | Status: DC

## 2017-02-20 NOTE — Assessment & Plan Note (Signed)
She has noticed a small bump on her left upper eyelid that is somewhat tender. She has not had eye discharge. She says it has somewhat decreased in size. No vision changes.  A/P: Exam and symptoms are consistent with a small/mild chalazion. I recommended warm compresses, consistent hand washing, and clean bedsheets/pillow cases.

## 2017-02-20 NOTE — Progress Notes (Signed)
   CC: HTN  HPI:  Olivia Ewing is a 67 y.o. female with PMH as listed below including HTN and lumbar radiculopathy who presents for follow up management of her HTN and for refills.  Essential hypertension She has been taking Lisinopril-HCTZ 20-12.5 mg daily. She ran out 3 days ago and is requesting a refill. She reports tolerating her medications well. BP Readings from Last 3 Encounters:  02/20/17 (!) 157/85  02/19/17 (!) 153/103  12/31/16 122/81   A/P: Her BP is elevated off of her antihypertensives. I have refilled and will continue her Lisinopril-HCTZ 20-12.5 mg daily. She will follow up with her PCP on 05/19/17 for a recheck and will need a BMET at that time.  Chalazion left upper eyelid She has noticed a small bump on her left upper eyelid that is somewhat tender. She has not had eye discharge. She says it has somewhat decreased in size. No vision changes.  A/P: Exam and symptoms are consistent with a small/mild chalazion. I recommended warm compresses, consistent hand washing, and clean bedsheets/pillow cases.     Past Medical History:  Diagnosis Date  . Anemia   . Arthritis    knees hands  . Bipolar disorder ([place])   . Cervicalgia   . Depression   . Environmental allergies    cause SOB, uses inhaler for  . Environmental and seasonal allergies    uses inhaler prn  . Full dentures   . GERD (gastroesophageal reflux disease)    diet controlled - no meds  . Hyperlipidemia    diet controlled, no meds  . Hypertension   . Lumbar radiculopathy, chronic   . Right knee pain    posterior horn medial meniscal tear MRI 2013  . Spinal stenosis    getting epidural injections -last one 06/12/2015  . SVD (spontaneous vaginal delivery)    x 4   Review of Systems:   Review of Systems  Constitutional: Negative for diaphoresis and fever.  Eyes: Negative for discharge and redness.  Respiratory: Negative for cough and shortness of breath.   Cardiovascular: Negative for chest  pain, palpitations and leg swelling.  Musculoskeletal: Positive for back pain.  Skin:       Bump left upper eyelid     Physical Exam:  Vitals:   02/20/17 1420  BP: (!) 157/85  Pulse: 70  Temp: 98.1 F (36.7 C)  TempSrc: Oral  SpO2: 100%  Weight: 189 lb 11.2 oz (86 kg)   Physical Exam  Constitutional: She is oriented to person, place, and time. She appears well-developed and well-nourished. No distress.  HENT:  Head: Normocephalic and atraumatic.  Eyes:  Small rubbery nodule/chalazion left upper eyelid, no discharge, erythema, skin break. PERRL, EOMI.  Cardiovascular: Normal rate and regular rhythm.  Pulmonary/Chest: Effort normal. No respiratory distress. She has no wheezes. She has no rales.  Musculoskeletal: She exhibits no edema.  Neurological: She is alert and oriented to person, place, and time.  Skin: She is not diaphoretic.    Assessment & Plan:   See Encounters Tab for problem based charting.  Patient discussed with Dr. Dareen Piano

## 2017-02-20 NOTE — Progress Notes (Deleted)
   CC: HTN  HPI:  Ms.Ellenor KATERYN MARASIGAN is a 67 y.o. female with PMH as listed below including HTN and lumbar radiculopathy who presents for follow up management of her HTN and for refills.  Past Medical History:  Diagnosis Date  . Anemia   . Arthritis    knees hands  . Bipolar disorder ([place])   . Cervicalgia   . Depression   . Environmental allergies    cause SOB, uses inhaler for  . Environmental and seasonal allergies    uses inhaler prn  . Full dentures   . GERD (gastroesophageal reflux disease)    diet controlled - no meds  . Hyperlipidemia    diet controlled, no meds  . Hypertension   . Lumbar radiculopathy, chronic   . Right knee pain    posterior horn medial meniscal tear MRI 2013  . Spinal stenosis    getting epidural injections -last one 06/12/2015  . SVD (spontaneous vaginal delivery)    x 4   Review of Systems:  ***  Physical Exam:  Vitals:   02/20/17 1420  BP: (!) 157/85  Pulse: 70  Temp: 98.1 F (36.7 C)  TempSrc: Oral  SpO2: 100%  Weight: 189 lb 11.2 oz (86 kg)   ***  Assessment & Plan:   See Encounters Tab for problem based charting.  Patient {GC/GE:3044014::"discussed with","seen with"} Dr. {NAMES:3044014::"Butcher","Granfortuna","E. Hoffman","Klima","Mullen","Narendra","Raines","Vincent"}

## 2017-02-20 NOTE — Assessment & Plan Note (Signed)
She has been taking Lisinopril-HCTZ 20-12.5 mg daily. She ran out 3 days ago and is requesting a refill. She reports tolerating her medications well. BP Readings from Last 3 Encounters:  02/20/17 (!) 157/85  02/19/17 (!) 153/103  12/31/16 122/81   A/P: Her BP is elevated off of her antihypertensives. I have refilled and will continue her Lisinopril-HCTZ 20-12.5 mg daily. She will follow up with her PCP on 05/19/17 for a recheck and will need a BMET at that time.

## 2017-02-20 NOTE — Patient Instructions (Signed)
It was a pleasure to see you Olivia Ewing.  I have refilled your blood pressure medication today.  Please continue to work on healthy eating patterns and exercise as tolerated.  You likely have a small chalazion of your upper left eyelid. Use warm compresses throughout the day to reduce the inflammation. This may take up to a week or longer to improve.  Please follow up with [name] in 3 months or sooner if needed.  Chalazion A chalazion is a swelling or lump on the eyelid. It can affect the upper or lower eyelid. What are the causes? This condition may be caused by:  Long-lasting (chronic) inflammation of the eyelid glands.  A blocked oil gland in the eyelid.  What are the signs or symptoms? Symptoms of this condition include:  A swelling on the eyelid. The swelling may spread to areas around the eye.  A hard lump on the eyelid. This lump may make it hard to see out of the eye.  How is this diagnosed? This condition is diagnosed with an examination of the eye. How is this treated? This condition is treated by applying a warm compress to the eyelid. If the condition does not improve after two days, it may be treated with:  Surgery.  Medicine that is injected into the chalazion by a health care provider.  Medicine that is applied to the eye.  Follow these instructions at home:  Do not touch the chalazion.  Do not try to remove the pus, such as by squeezing the chalazion or sticking it with a pin or needle.  Do not rub your eyes.  Wash your hands often. Dry your hands with a clean towel.  Keep your face, scalp, and eyebrows clean.  Avoid wearing eye makeup.  Apply a warm, moist compress to the eyelid 4-6 times a day for 10-15 minutes at a time. This will help to open any blocked glands and help to reduce redness and swelling.  Apply over-the-counter and prescription medicines only as told by your health care provider.  If the chalazion does not break open (rupture) on  its own in a month, return to your health care provider.  Keep all follow-up appointments as told by your health care provider. This is important. Contact a health care provider if:  Your eyelid has not improved in 4 weeks.  Your eyelid is getting worse.  You have a fever.  The chalazion does not rupture on its own with home treatment in a month. Get help right away if:  You have pain in your eye.  Your vision changes.  The chalazion becomes painful or red  The chalazion gets bigger. This information is not intended to replace advice given to you by your health care provider. Make sure you discuss any questions you have with your health care provider. Document Released: 01/26/2000 Document Revised: 07/06/2015 Document Reviewed: 05/23/2014 Elsevier Interactive Patient Education  [employer].

## 2017-02-21 ENCOUNTER — Other Ambulatory Visit: Payer: Self-pay | Admitting: Internal Medicine

## 2017-02-21 NOTE — Telephone Encounter (Signed)
done

## 2017-02-21 NOTE — Progress Notes (Signed)
[facility] Medicine Clinic Attending  Case discussed with Dr. Patel at the time of the visit.  We reviewed the resident's history and exam and pertinent patient test results.  I agree with the assessment, diagnosis, and plan of care documented in the resident's note.  

## 2017-02-21 NOTE — Telephone Encounter (Signed)
Patient is requesting inhaler prescription

## 2017-03-25 ENCOUNTER — Telehealth: Payer: Self-pay | Admitting: Sports Medicine

## 2017-03-25 NOTE — Telephone Encounter (Signed)
Patient fell last week after having her first shot at [facility].  She is wanting to know if she can get another shot now, if not too early because now her back is hurting very badly.  Does she need another referral/order?

## 2017-03-26 ENCOUNTER — Ambulatory Visit: Payer: Medicare HMO | Admitting: Family Medicine

## 2017-04-01 ENCOUNTER — Ambulatory Visit (INDEPENDENT_AMBULATORY_CARE_PROVIDER_SITE_OTHER): Payer: Medicare HMO | Admitting: Sports Medicine

## 2017-04-01 ENCOUNTER — Encounter: Payer: Self-pay | Admitting: Sports Medicine

## 2017-04-01 VITALS — BP 162/83 | Ht 64.0 in | Wt 190.0 lb

## 2017-04-01 DIAGNOSIS — M5416 Radiculopathy, lumbar region: Secondary | ICD-10-CM

## 2017-04-01 MED ORDER — CYCLOBENZAPRINE HCL 5 MG PO TABS: 5.0000 mg | ORAL_TABLET | Freq: Three times a day (TID) | ORAL | 1 refills | Status: DC | PRN

## 2017-04-01 NOTE — Patient Instructions (Signed)
Call [facility] at [phone] to set up your next injection

## 2017-04-02 ENCOUNTER — Other Ambulatory Visit: Payer: Self-pay | Admitting: Sports Medicine

## 2017-04-02 DIAGNOSIS — M5416 Radiculopathy, lumbar region: Secondary | ICD-10-CM

## 2017-04-02 NOTE — Progress Notes (Signed)
   Subjective:    [name], female    DOB: 06/15/1950, 67 y.o.   MRN: [id number]  HPI chief complaint: Low back pain  Patient comes in today with returning low back pain. She has a well-documented history of lumbar spinal stenosis. Her last ESI was in January. She was feeling good up until she slipped in her kitchen, but avoided falling by bracing herself against the counter. Although she did not fall, she did have an immediate onset of low back pain identical to the pain that she has experienced previously. She is complaining of spasm diffusely as well. She is requesting a repeat ESI. She denies changes in bowel or bladder. No groin pain.   Review of Systems As above    Objective:   Physical Exam  Well-developed, well-nourished. She is in some mild distress due to her pain. Vital signs reviewed  Lumbar spine: Exam limited by pain. Limited flexion and extension. Diffuse spasm of the paraspinal musculature. No tenderness along the midline. No focal neurological deficits of either lower extremity.  Examination of both hips shows smooth painless hip range of motion with a negative logroll.      Assessment & Plan:   Returning low back pain secondary to spinal stenosis  Patient will be referred back over to [facility] for repeat lumbar ESI. If she does not experience the same relief that she has had previously then she will notify me and I will need to get updated imaging of her lumbar spine. I've also given her a refill of Flexeril to take as needed for spasm. Follow-up as needed.

## 2017-04-08 ENCOUNTER — Ambulatory Visit
Admission: RE | Admit: 2017-04-08 | Discharge: 2017-04-08 | Disposition: A | Discharge status: Home / Self Care | Payer: Medicare HMO | Source: Ambulatory Visit | Attending: Sports Medicine | Admitting: Sports Medicine

## 2017-04-08 DIAGNOSIS — M5416 Radiculopathy, lumbar region: Secondary | ICD-10-CM

## 2017-04-08 MED ORDER — IOPAMIDOL (ISOVUE-M 200) INJECTION 41%
1.0000 mL | Freq: Once | INTRAMUSCULAR | Status: AC
  Administered 2017-04-08: 1 mL via EPIDURAL

## 2017-04-08 MED ORDER — METHYLPREDNISOLONE ACETATE 40 MG/ML INJ SUSP (RADIOLOG
120.0000 mg | Freq: Once | INTRAMUSCULAR | Status: AC
  Administered 2017-04-08: 120 mg via EPIDURAL

## 2017-04-08 NOTE — Discharge Instructions (Signed)

## 2017-05-05 ENCOUNTER — Other Ambulatory Visit: Payer: Self-pay | Admitting: Neurosurgery

## 2017-05-05 DIAGNOSIS — M48062 Spinal stenosis, lumbar region with neurogenic claudication: Secondary | ICD-10-CM

## 2017-05-12 ENCOUNTER — Other Ambulatory Visit: Payer: Self-pay | Admitting: Sports Medicine

## 2017-05-12 ENCOUNTER — Other Ambulatory Visit: Payer: Self-pay | Admitting: Family Medicine

## 2017-05-12 ENCOUNTER — Telehealth: Payer: Self-pay | Admitting: *Deleted

## 2017-05-12 DIAGNOSIS — M5416 Radiculopathy, lumbar region: Secondary | ICD-10-CM

## 2017-05-12 NOTE — Telephone Encounter (Signed)
Order placed and patient will call to set up appt

## 2017-05-19 ENCOUNTER — Encounter: Payer: Medicare HMO | Admitting: Internal Medicine

## 2017-05-19 ENCOUNTER — Encounter: Payer: Self-pay | Admitting: Internal Medicine

## 2017-05-19 ENCOUNTER — Ambulatory Visit
Admission: RE | Admit: 2017-05-19 | Discharge: 2017-05-19 | Disposition: A | Discharge status: Home / Self Care | Payer: Medicare HMO | Source: Ambulatory Visit | Attending: Neurosurgery | Admitting: Neurosurgery

## 2017-05-19 DIAGNOSIS — M48062 Spinal stenosis, lumbar region with neurogenic claudication: Secondary | ICD-10-CM

## 2017-05-21 ENCOUNTER — Other Ambulatory Visit: Payer: Self-pay | Admitting: *Deleted

## 2017-05-21 ENCOUNTER — Other Ambulatory Visit: Payer: Self-pay | Admitting: Sports Medicine

## 2017-05-21 MED ORDER — HYDROCODONE-ACETAMINOPHEN 5-325 MG PO TABS: 1.0000 | ORAL_TABLET | Freq: Four times a day (QID) | ORAL | 0 refills | Status: DC | PRN

## 2017-06-12 ENCOUNTER — Ambulatory Visit
Admission: RE | Admit: 2017-06-12 | Discharge: 2017-06-12 | Disposition: A | Discharge status: Home / Self Care | Payer: Medicare HMO | Source: Ambulatory Visit | Attending: Sports Medicine | Admitting: Sports Medicine

## 2017-06-12 ENCOUNTER — Other Ambulatory Visit: Payer: Self-pay | Admitting: Internal Medicine

## 2017-06-12 DIAGNOSIS — M5416 Radiculopathy, lumbar region: Secondary | ICD-10-CM

## 2017-06-12 DIAGNOSIS — I10 Essential (primary) hypertension: Secondary | ICD-10-CM

## 2017-06-12 MED ORDER — IOPAMIDOL (ISOVUE-M 200) INJECTION 41%
1.0000 mL | Freq: Once | INTRAMUSCULAR | Status: AC
  Administered 2017-06-12: 1 mL via EPIDURAL

## 2017-06-12 MED ORDER — METHYLPREDNISOLONE ACETATE 40 MG/ML INJ SUSP (RADIOLOG
120.0000 mg | Freq: Once | INTRAMUSCULAR | Status: AC
  Administered 2017-06-12: 120 mg via EPIDURAL

## 2017-06-12 NOTE — Telephone Encounter (Signed)
Will give pt a 30 day supply to get an appointment

## 2017-06-13 NOTE — Telephone Encounter (Signed)
Spoke with the pt.  Appt sch for 07/21/2017 @ 1:15pm with her PCP.

## 2017-06-17 ENCOUNTER — Other Ambulatory Visit: Payer: Medicare HMO

## 2017-07-02 ENCOUNTER — Other Ambulatory Visit: Payer: Self-pay | Admitting: *Deleted

## 2017-07-02 ENCOUNTER — Other Ambulatory Visit: Payer: Self-pay | Admitting: Sports Medicine

## 2017-07-02 MED ORDER — CYCLOBENZAPRINE HCL 5 MG PO TABS: 5.0000 mg | ORAL_TABLET | Freq: Three times a day (TID) | ORAL | 0 refills | Status: DC | PRN

## 2017-07-04 ENCOUNTER — Ambulatory Visit: Payer: Medicare HMO | Admitting: Family Medicine

## 2017-07-21 ENCOUNTER — Encounter: Payer: Medicare HMO | Admitting: Internal Medicine

## 2017-07-21 ENCOUNTER — Encounter: Payer: Self-pay | Admitting: Internal Medicine

## 2017-07-21 ENCOUNTER — Other Ambulatory Visit: Payer: Self-pay | Admitting: Internal Medicine

## 2017-07-21 DIAGNOSIS — I10 Essential (primary) hypertension: Secondary | ICD-10-CM

## 2017-07-21 NOTE — Telephone Encounter (Signed)
Will do 30 day supply pt has not had bmet in 2 years needs a follow up appt

## 2017-08-18 ENCOUNTER — Other Ambulatory Visit: Payer: Self-pay | Admitting: Sports Medicine

## 2017-08-20 ENCOUNTER — Other Ambulatory Visit: Payer: Self-pay | Admitting: Neurosurgery

## 2017-08-20 DIAGNOSIS — M48062 Spinal stenosis, lumbar region with neurogenic claudication: Secondary | ICD-10-CM

## 2017-08-25 ENCOUNTER — Encounter: Payer: Medicare HMO | Admitting: Internal Medicine

## 2017-08-25 ENCOUNTER — Ambulatory Visit
Admission: RE | Admit: 2017-08-25 | Discharge: 2017-08-25 | Disposition: A | Discharge status: Home / Self Care | Payer: Medicare HMO | Source: Ambulatory Visit | Attending: Neurosurgery | Admitting: Neurosurgery

## 2017-08-25 DIAGNOSIS — M48062 Spinal stenosis, lumbar region with neurogenic claudication: Secondary | ICD-10-CM

## 2017-08-25 MED ORDER — IOPAMIDOL (ISOVUE-M 200) INJECTION 41%
1.0000 mL | Freq: Once | INTRAMUSCULAR | Status: AC
  Administered 2017-08-25: 1 mL via EPIDURAL

## 2017-08-25 MED ORDER — METHYLPREDNISOLONE ACETATE 40 MG/ML INJ SUSP (RADIOLOG
120.0000 mg | Freq: Once | INTRAMUSCULAR | Status: AC
  Administered 2017-08-25: 120 mg via EPIDURAL

## 2017-08-25 NOTE — Discharge Instructions (Signed)

## 2017-09-08 ENCOUNTER — Encounter: Payer: Medicare HMO | Admitting: Internal Medicine

## 2017-09-08 ENCOUNTER — Encounter: Payer: Self-pay | Admitting: Internal Medicine

## 2017-09-21 ENCOUNTER — Inpatient Hospital Stay (HOSPITAL_COMMUNITY)
Admission: AD | Admit: 2017-09-21 | Discharge: 2017-09-21 | Disposition: A | Discharge status: Home / Self Care | Payer: Medicare HMO | Source: Ambulatory Visit | Attending: Obstetrics and Gynecology | Admitting: Obstetrics and Gynecology

## 2017-09-21 ENCOUNTER — Other Ambulatory Visit: Payer: Self-pay

## 2017-09-21 ENCOUNTER — Encounter (HOSPITAL_COMMUNITY): Payer: Self-pay | Admitting: *Deleted

## 2017-09-21 DIAGNOSIS — Z818 Family history of other mental and behavioral disorders: Secondary | ICD-10-CM | POA: Insufficient documentation

## 2017-09-21 DIAGNOSIS — N3289 Other specified disorders of bladder: Secondary | ICD-10-CM | POA: Diagnosis present

## 2017-09-21 DIAGNOSIS — M4800 Spinal stenosis, site unspecified: Secondary | ICD-10-CM | POA: Diagnosis not present

## 2017-09-21 DIAGNOSIS — F319 Bipolar disorder, unspecified: Secondary | ICD-10-CM | POA: Insufficient documentation

## 2017-09-21 DIAGNOSIS — I10 Essential (primary) hypertension: Secondary | ICD-10-CM | POA: Diagnosis not present

## 2017-09-21 DIAGNOSIS — G8929 Other chronic pain: Secondary | ICD-10-CM | POA: Diagnosis not present

## 2017-09-21 DIAGNOSIS — Z79899 Other long term (current) drug therapy: Secondary | ICD-10-CM | POA: Diagnosis not present

## 2017-09-21 DIAGNOSIS — Z90710 Acquired absence of both cervix and uterus: Secondary | ICD-10-CM | POA: Insufficient documentation

## 2017-09-21 DIAGNOSIS — M5441 Lumbago with sciatica, right side: Secondary | ICD-10-CM

## 2017-09-21 DIAGNOSIS — Z87891 Personal history of nicotine dependence: Secondary | ICD-10-CM | POA: Diagnosis not present

## 2017-09-21 DIAGNOSIS — M1990 Unspecified osteoarthritis, unspecified site: Secondary | ICD-10-CM | POA: Diagnosis not present

## 2017-09-21 DIAGNOSIS — Z8249 Family history of ischemic heart disease and other diseases of the circulatory system: Secondary | ICD-10-CM | POA: Insufficient documentation

## 2017-09-21 LAB — URINALYSIS, ROUTINE W REFLEX MICROSCOPIC
BILIRUBIN URINE: NEGATIVE
Glucose, UA: NEGATIVE mg/dL
Hgb urine dipstick: NEGATIVE
KETONES UR: NEGATIVE mg/dL
Leukocytes, UA: NEGATIVE
Nitrite: NEGATIVE
PROTEIN: NEGATIVE mg/dL
Specific Gravity, Urine: 1.012 (ref 1.005–1.030)
pH: 6 (ref 5.0–8.0)

## 2017-09-21 MED ORDER — KETOROLAC TROMETHAMINE 60 MG/2ML IM SOLN
60.0000 mg | Freq: Once | INTRAMUSCULAR | Status: AC
  Administered 2017-09-21: 60 mg via INTRAMUSCULAR
  Filled 2017-09-21: qty 2

## 2017-09-21 MED ORDER — PHENAZOPYRIDINE HCL 200 MG PO TABS: 200.0000 mg | ORAL_TABLET | Freq: Three times a day (TID) | ORAL | 1 refills | Status: DC | PRN

## 2017-09-21 MED ORDER — KETOROLAC TROMETHAMINE 10 MG PO TABS: 10.0000 mg | ORAL_TABLET | Freq: Four times a day (QID) | ORAL | 0 refills | Status: DC | PRN

## 2017-09-21 NOTE — MAU Note (Signed)
Pt presents with c/o bladder spasms & pain in right leg.

## 2017-09-21 NOTE — MAU Provider Note (Signed)
Chief Complaint: Leg Pain and Bladder Spasms   First Provider Initiated Contact with Patient 09/21/17 1829     SUBJECTIVE HPI: Olivia Ewing is a 67 y.o. [id number] female who presents [facility] reporting bladder spasms x 2 days. Had bladder mesh placed and hysterectomy several years ago and has had intermittent problems w/ bladder spasms since then. Does not feel like UTI.  Location: suprapubic Quality: spasming Severity: Moderate Duration: 1 week Context: Hx bladder mesh Timing: intermittent Modifying factors: None. Hasn't tried anything for the Sx. States Toradol has hled in the past. Doesn't know if she's ever had pyridium.  Associated signs and symptoms:  Denies fever, chills, dysuria, hematuria, urgency, frequency, flank pain, vaginal bleeding, vaginal discharge or itching.   Also C/O exacerbation of right low back pain radiating about to right thigh over the past ~ 3 days. Has spinal stenosis and is under the care of Dr. Christella Noa at [facility]. Gets epidural steroid injections for the pain--last dose 08/25/17. Plans to call office tomorrow (Monday) about exacerbation of pain.   Associated Sx: neg for fever, chills, loss of bladder or bowel control. Has chronic right-sided radicular Sx.   Past Medical History:  Diagnosis Date  . Anemia   . Arthritis    knees hands  . Bipolar disorder ([place])   . Cervicalgia   . Depression   . Environmental allergies    cause SOB, uses inhaler for  . Environmental and seasonal allergies    uses inhaler prn  . Full dentures   . GERD (gastroesophageal reflux disease)    diet controlled - no meds  . Hyperlipidemia    diet controlled, no meds  . Hypertension   . Lumbar radiculopathy, chronic   . Right knee pain    posterior horn medial meniscal tear MRI 2013  . Spinal stenosis    getting epidural injections -last one 06/12/2015  . SVD (spontaneous vaginal delivery)    x 4   OB History  Gravida  Para Term Preterm AB Living  7 4 4  0 3 4  SAB TAB Ectopic Multiple Live Births  3 0 0 0      # Outcome Date GA Lbr Len/2nd Weight Sex Delivery Anes PTL Lv  7 SAB           6 SAB           5 SAB           4 Term           3 Term           2 Term           1 Term            Past Surgical History:  Procedure Laterality Date  . APPENDECTOMY    . Bladder tack  10/2006   cystocoele  . BREAST SURGERY Right    benign cyst  . COLONOSCOPY    . Hysterectomy other    . TUBAL LIGATION     Social History   Socioeconomic History  . Marital status: Divorced    Spouse name: Not on file  . Number of children: 4  . Years of education: Not on file  . Highest education level: Not on file  Occupational History  . Not on file  Social Needs  . Financial resource strain: Not on file  . Food insecurity:    Worry: Not on file    Inability: Not on  file  . Transportation needs:    Medical: Not on file    Non-medical: Not on file  Tobacco Use  . Smoking status: Never Smoker  . Smokeless tobacco: Never Used  Substance and Sexual Activity  . Alcohol use: Yes    Alcohol/week: 0.0 standard drinks    Comment: socially wine  . Drug use: Yes    Types: Marijuana    Comment: last used 2019  . Sexual activity: Not Currently    Birth control/protection: Surgical  Lifestyle  . Physical activity:    Days per week: Not on file    Minutes per session: Not on file  . Stress: Not on file  Relationships  . Social connections:    Talks on phone: Not on file    Gets together: Not on file    Attends religious service: Not on file    Active member of club or organization: Not on file    Attends meetings of clubs or organizations: Not on file    Relationship status: Not on file  . Intimate partner violence:    Fear of current or ex partner: Not on file    Emotionally abused: Not on file    Physically abused: Not on file    Forced sexual activity: Not on file  Other Topics Concern  . Not on file   Social History Narrative   Single. 9 siblings. 1 brother with HIV, 1 sister with hypertension. 4 children, 1 daughter with HTN and depression, 1 son with HTN.   [phone]- shelter's number.    Former smoker, Hx cocaine use.   Family History  Problem Relation Age of Onset  . Hypertension Mother    No current facility-administered medications on file prior to encounter.    Current Outpatient Medications on File Prior to Encounter  Medication Sig Dispense Refill  . albuterol (PROVENTIL HFA;VENTOLIN HFA) 108 (90 Base) MCG/ACT inhaler Inhale 1-2 puffs into the lungs every 6 (six) hours as needed for wheezing or shortness of breath. 1 Inhaler 1  . cyclobenzaprine (FLEXERIL) 5 MG tablet Take 1 tablet (5 mg total) by mouth 3 (three) times daily as needed for muscle spasms. 30 tablet 0  . cyclobenzaprine (FLEXERIL) 5 MG tablet TAKE 1 TABLET BY MOUTH THREE TIMES A DAY AS NEEDED FOR MUSCLE SPASMS 30 tablet 0  . ferrous sulfate 325 (65 FE) MG tablet Take 1 tablet (325 mg total) by mouth daily with breakfast. 90 tablet 0  . FLUoxetine (PROZAC) 20 MG capsule Take 20 mg by mouth daily.      Marland Kitchen gabapentin (NEURONTIN) 300 MG capsule Take 2 tabs three times a day (Patient taking differently: Take 300 mg by mouth 3 (three) times daily. Take 2 tabs three times a day) 180 capsule 3  . HYDROcodone-acetaminophen (NORCO) 5-325 MG tablet Take 1 tablet by mouth every 6 (six) hours as needed for moderate pain. 10 tablet 0  . lisinopril-hydrochlorothiazide (PRINZIDE,ZESTORETIC) 20-12.5 MG tablet TAKE 1 TABLET BY MOUTH DAILY  NEED APPOINTMENT WITH DOCTOR WINFREY FOR ADDITIONAL REFILL 30 tablet 0  . Multiple Vitamin (MULTIVITAMIN WITH MINERALS) TABS tablet Take 1 tablet by mouth daily.    . QUEtiapine (SEROQUEL) 50 MG tablet Take 50 mg by mouth at bedtime.    . vitamin E 400 UNIT capsule Take 400 Units by mouth daily.     No Known Allergies  I have reviewed patient's Past Medical Hx, Surgical Hx, Family Hx, Social Hx,  medications and allergies.   Review of Systems  Constitutional: Negative for chills and fever.  Gastrointestinal: Positive for abdominal pain. Negative for constipation, diarrhea, nausea and vomiting.  Endocrine: Negative for polydipsia and polyuria.  Genitourinary: Negative for difficulty urinating, dyspareunia, dysuria, flank pain, frequency, hematuria, urgency, vaginal bleeding, vaginal discharge and vaginal pain.       Bladder spasms  Musculoskeletal: Positive for back pain and gait problem. Negative for myalgias.    OBJECTIVE Patient Vitals for the past 24 hrs:  BP Temp Temp src Pulse Resp SpO2 Height Weight  09/21/17 1923 (!) 151/94 98.3 F (36.8 C) Oral 79 18 98 % - -  09/21/17 1526 128/83 (!) 97.4 F (36.3 C) Oral 83 20 99 % 5\' 5"  (1.651 m) 82.2 kg   Constitutional: Well-developed, well-nourished female in mild distress.  Cardiovascular: normal rate Respiratory: normal rate and effort.  GI: Abd soft, non-tender. MS: Extremities nontender, no edema, normal ROM Neurologic: Alert and oriented x 4. Difficulty w/ gait. Using cane which she has to use intermittently due to spinal stenosis.  GU: Neg CVAT.  SPECULUM EXAM: Declined  LAB RESULTS Results for orders placed or performed during the hospital encounter of 09/21/17 (from the past 24 hour(s))  Urinalysis, Routine w reflex microscopic     Status: Abnormal   Collection Time: 09/21/17  4:17 PM  Result Value Ref Range   Color, Urine YELLOW YELLOW   APPearance HAZY (A) CLEAR   Specific Gravity, Urine 1.012 1.005 - 1.030   pH 6.0 5.0 - 8.0   Glucose, UA NEGATIVE NEGATIVE mg/dL   Hgb urine dipstick NEGATIVE NEGATIVE   Bilirubin Urine NEGATIVE NEGATIVE   Ketones, ur NEGATIVE NEGATIVE mg/dL   Protein, ur NEGATIVE NEGATIVE mg/dL   Nitrite NEGATIVE NEGATIVE   Leukocytes, UA NEGATIVE NEGATIVE    IMAGING NA  MAU COURSE Orders Placed This Encounter  Procedures  . Urine Culture  . Urinalysis, Routine w reflex microscopic   . Discharge patient   Meds ordered this encounter  Medications  . ketorolac (TORADOL) injection 60 mg  . phenazopyridine (PYRIDIUM) 200 MG tablet    Sig: Take 1 tablet (200 mg total) by mouth 3 (three) times daily as needed for pain.    Dispense:  20 tablet    Refill:  1    Order Specific Question:   Supervising Provider    Answer:   Jonnie Kind [[id number]]  . ketorolac (TORADOL) 10 MG tablet    Sig: Take 1 tablet (10 mg total) by mouth every 6 (six) hours as needed.    Dispense:  20 tablet    Refill:  0    Order Specific Question:   Supervising Provider    Answer:   Jonnie Kind [[id number]]   Offered Pyridium. Declined in MAU (would like Rx for home.) States Toradol has worked well in the past. Explained it cannot be used long-term 2/2 renal; toxicity.   Leg pain and bladder spasm pain resolved w/ Toradol. Rx #20 to use sparingly. Rx Pyridium.  MDM - Bladder spasms w/out evidence of bladder infection of UA. Culture sent. Rx Pyridium, Toradol. - Chronic back, leg pain and radiculopathy. No evidence of emergent condition. Call Dr. Christella Noa in am. Go to [facility] ED for new weakness, loss of sensation or bladder or bowel control.    ASSESSMENT 1. Bladder spasms   2. Chronic right-sided low back pain with right-sided sciatica     PLAN Discharge home in stable condition. UTI precautions Follow-up Information    [facility]. Call.  Why:  Tomorrow to schedule follow-up appointment        [facility].   Specialty:  Emergency Medicine Why:  as needed in emergencies Contact information: [address] [id number] [place] [phone]         Allergies as of 09/21/2017   No Known Allergies     Medication List    TAKE these medications   albuterol 108 (90 Base) MCG/ACT inhaler Commonly known as:  PROVENTIL HFA;VENTOLIN HFA Inhale 1-2 puffs into the lungs every 6 (six) hours as needed  for wheezing or shortness of breath.   cyclobenzaprine 5 MG tablet Commonly known as:  FLEXERIL Take 1 tablet (5 mg total) by mouth 3 (three) times daily as needed for muscle spasms.   cyclobenzaprine 5 MG tablet Commonly known as:  FLEXERIL TAKE 1 TABLET BY MOUTH THREE TIMES A DAY AS NEEDED FOR MUSCLE SPASMS   ferrous sulfate 325 (65 FE) MG tablet Take 1 tablet (325 mg total) by mouth daily with breakfast.   FLUoxetine 20 MG capsule Commonly known as:  PROZAC Take 20 mg by mouth daily.   gabapentin 300 MG capsule Commonly known as:  NEURONTIN Take 2 tabs three times a day What changed:    how much to take  how to take this  when to take this   HYDROcodone-acetaminophen 5-325 MG tablet Commonly known as:  NORCO/VICODIN Take 1 tablet by mouth every 6 (six) hours as needed for moderate pain.   ketorolac 10 MG tablet Commonly known as:  TORADOL Take 1 tablet (10 mg total) by mouth every 6 (six) hours as needed.   lisinopril-hydrochlorothiazide 20-12.5 MG tablet Commonly known as:  PRINZIDE,ZESTORETIC TAKE 1 TABLET BY MOUTH DAILY  NEED APPOINTMENT WITH DOCTOR WINFREY FOR ADDITIONAL REFILL   multivitamin with minerals Tabs tablet Take 1 tablet by mouth daily.   phenazopyridine 200 MG tablet Commonly known as:  PYRIDIUM Take 1 tablet (200 mg total) by mouth 3 (three) times daily as needed for pain.   QUEtiapine 50 MG tablet Commonly known as:  SEROQUEL Take 50 mg by mouth at bedtime.   vitamin E 400 UNIT capsule Take 400 Units by mouth daily.        Tamala Julian, [place], [place] 09/21/2017  7:41 PM

## 2017-09-21 NOTE — Discharge Instructions (Signed)

## 2017-09-23 LAB — URINE CULTURE: SPECIAL REQUESTS: NORMAL

## 2017-10-20 ENCOUNTER — Other Ambulatory Visit: Payer: Self-pay | Admitting: Neurosurgery

## 2017-10-20 DIAGNOSIS — M48062 Spinal stenosis, lumbar region with neurogenic claudication: Secondary | ICD-10-CM

## 2017-10-30 ENCOUNTER — Ambulatory Visit
Admission: RE | Admit: 2017-10-30 | Discharge: 2017-10-30 | Disposition: A | Discharge status: Home / Self Care | Payer: Medicare HMO | Source: Ambulatory Visit | Attending: Neurosurgery | Admitting: Neurosurgery

## 2017-10-30 DIAGNOSIS — M48062 Spinal stenosis, lumbar region with neurogenic claudication: Secondary | ICD-10-CM

## 2017-10-30 MED ORDER — METHYLPREDNISOLONE ACETATE 40 MG/ML INJ SUSP (RADIOLOG
120.0000 mg | Freq: Once | INTRAMUSCULAR | Status: AC
  Administered 2017-10-30: 120 mg via EPIDURAL

## 2017-10-30 MED ORDER — IOPAMIDOL (ISOVUE-M 200) INJECTION 41%
1.0000 mL | Freq: Once | INTRAMUSCULAR | Status: AC
  Administered 2017-10-30: 1 mL via EPIDURAL

## 2017-10-30 NOTE — Discharge Instructions (Signed)

## 2017-11-06 ENCOUNTER — Other Ambulatory Visit: Payer: Self-pay | Admitting: Internal Medicine

## 2017-11-06 DIAGNOSIS — I10 Essential (primary) hypertension: Secondary | ICD-10-CM

## 2017-11-11 NOTE — Telephone Encounter (Signed)
Will fill short course patient needs appt and labs

## 2017-12-11 ENCOUNTER — Other Ambulatory Visit: Payer: Self-pay | Admitting: Neurosurgery

## 2017-12-11 DIAGNOSIS — M48062 Spinal stenosis, lumbar region with neurogenic claudication: Secondary | ICD-10-CM

## 2017-12-22 ENCOUNTER — Encounter: Payer: Self-pay | Admitting: Internal Medicine

## 2017-12-22 ENCOUNTER — Other Ambulatory Visit: Payer: Self-pay | Admitting: Neurosurgery

## 2017-12-22 ENCOUNTER — Encounter: Payer: Medicare HMO | Admitting: Internal Medicine

## 2017-12-22 DIAGNOSIS — M48062 Spinal stenosis, lumbar region with neurogenic claudication: Secondary | ICD-10-CM

## 2017-12-22 NOTE — Progress Notes (Deleted)
CC: HTN, HLD, health maintenance  HPI:  Olivia Ewing is a 67 y.o. female with PMH below.  Today we will address HTN, HLD, health maintenance  HTN:  Pt currently on prinzide 20-12.5mg .  Last bp showed good control.  Today bp is ***  -continue above  HLD:  2014 LDL was 194, charted at that time to start pravastatin 40mg , do not see this on her med list.    Will inquire today and ultimately switch to crestor 20mg  daily   Please see A&P for status of the patient's chronic medical conditions  Past Medical History:  Diagnosis Date  . Anemia   . Arthritis    knees hands  . Bipolar disorder ([place])   . Cervicalgia   . Depression   . Environmental allergies    cause SOB, uses inhaler for  . Environmental and seasonal allergies    uses inhaler prn  . Full dentures   . GERD (gastroesophageal reflux disease)    diet controlled - no meds  . Hyperlipidemia    diet controlled, no meds  . Hypertension   . Lumbar radiculopathy, chronic   . Right knee pain    posterior horn medial meniscal tear MRI 2013  . Spinal stenosis    getting epidural injections -last one 06/12/2015  . SVD (spontaneous vaginal delivery)    x 4   Review of Systems:  ***  Physical Exam:  There were no vitals filed for this visit. ***  Social History   Socioeconomic History  . Marital status: Divorced    Spouse name: Not on file  . Number of children: 4  . Years of education: Not on file  . Highest education level: Not on file  Occupational History  . Not on file  Social Needs  . Financial resource strain: Not on file  . Food insecurity:    Worry: Not on file    Inability: Not on file  . Transportation needs:    Medical: Not on file    Non-medical: Not on file  Tobacco Use  . Smoking status: Never Smoker  . Smokeless tobacco: Never Used  Substance and Sexual Activity  . Alcohol use: Yes    Alcohol/week: 0.0 standard drinks    Comment: socially wine  . Drug use: Yes    Types:  Marijuana    Comment: last used 2019  . Sexual activity: Not Currently    Birth control/protection: Surgical  Lifestyle  . Physical activity:    Days per week: Not on file    Minutes per session: Not on file  . Stress: Not on file  Relationships  . Social connections:    Talks on phone: Not on file    Gets together: Not on file    Attends religious service: Not on file    Active member of club or organization: Not on file    Attends meetings of clubs or organizations: Not on file    Relationship status: Not on file  . Intimate partner violence:    Fear of current or ex partner: Not on file    Emotionally abused: Not on file    Physically abused: Not on file    Forced sexual activity: Not on file  Other Topics Concern  . Not on file  Social History Narrative   Single. 9 siblings. 1 brother with HIV, 1 sister with hypertension. 4 children, 1 daughter with HTN and depression, 1 son with HTN.   [phone]- shelter's number.  Former smoker, no alcohol or drug use.   *** Family History  Problem Relation Age of Onset  . Hypertension Mother     Assessment & Plan:   See Encounters Tab for problem based charting.  Patient {GC/GE:3044014::"discussed with","seen with"} Dr. {NAMES:[id number]::"Butcher","Granfortuna","E. Hoffman","Klima","Mullen","Narendra","Raines","Vincent"}

## 2017-12-24 ENCOUNTER — Ambulatory Visit (INDEPENDENT_AMBULATORY_CARE_PROVIDER_SITE_OTHER): Payer: Medicare HMO | Admitting: Obstetrics and Gynecology

## 2017-12-24 ENCOUNTER — Encounter: Payer: Self-pay | Admitting: Internal Medicine

## 2017-12-24 ENCOUNTER — Encounter: Payer: Self-pay | Admitting: Obstetrics and Gynecology

## 2017-12-24 VITALS — BP 132/90 | HR 90 | Ht 65.0 in | Wt 179.2 lb

## 2017-12-24 DIAGNOSIS — R32 Unspecified urinary incontinence: Secondary | ICD-10-CM

## 2017-12-24 MED ORDER — OXYCODONE-ACETAMINOPHEN 5-325 MG PO TABS: 1.0000 | ORAL_TABLET | Freq: Four times a day (QID) | ORAL | 0 refills | Status: DC | PRN

## 2017-12-24 NOTE — Progress Notes (Signed)
67 yo postmenopausal who is here for the evaluation of right groin pain. Patient reports onset of the pain in the past 2 weeks. The pain is so severe that she now requires a cane for assistance. She reports that during that time, she has also been experiencing urinary incontinence. Patient is sexually active without complaints. Patient had permanent suture material removed from vaginal cuff in 2017  Past Medical History:  Diagnosis Date  . Anemia   . Arthritis    knees hands  . Bipolar disorder ([place])   . Cervicalgia   . Depression   . Environmental allergies    cause SOB, uses inhaler for  . Environmental and seasonal allergies    uses inhaler prn  . Full dentures   . GERD (gastroesophageal reflux disease)    diet controlled - no meds  . Hyperlipidemia    diet controlled, no meds  . Hypertension   . Lumbar radiculopathy, chronic   . Right knee pain    posterior horn medial meniscal tear MRI 2013  . Spinal stenosis    getting epidural injections -last one 06/12/2015  . SVD (spontaneous vaginal delivery)    x 4   Past Surgical History:  Procedure Laterality Date  . APPENDECTOMY    . Bladder tack  10/2006   cystocoele  . BREAST SURGERY Right    benign cyst  . COLONOSCOPY    . Hysterectomy other    . TUBAL LIGATION     Family History  Problem Relation Age of Onset  . Hypertension Mother    Social History   Tobacco Use  . Smoking status: Never Smoker  . Smokeless tobacco: Never Used  Substance Use Topics  . Alcohol use: Yes    Alcohol/week: 0.0 standard drinks    Comment: socially wine  . Drug use: Yes    Types: Marijuana    Comment: last used 2019   ROS See pertinent in HPI  Blood pressure 132/90, pulse 90, height 5\' 5"  (1.651 m), weight 179 lb 3.2 oz (81.3 kg). GENERAL: Well-developed, well-nourished female in no acute distress.  ABDOMEN: Soft,  nondistended. No organomegaly. Positive right lower quadrant pain  PELVIC: Normal external female genitalia. Vagina  is pink and rugated. No adnexal mass or tenderness. EXTREMITIES: No cyanosis, clubbing, or edema, 2+ distal pulses.  A/P 67 yo [id number] with history of bladder sling now with urinary incontinence and groin pain - Will refer patient to see urogynecology - Rx percocet provided to assist with pain. Patient is scheduled to receive an epidural injection later this month

## 2017-12-24 NOTE — Progress Notes (Signed)
Pt reports pain in right groin 10/10 and is concerned about her bladder mesh. Pt also reports being incontinent in the last two weeks. Pt has been having these symptoms for approx. 2 weeks.

## 2017-12-31 ENCOUNTER — Ambulatory Visit
Admission: RE | Admit: 2017-12-31 | Discharge: 2017-12-31 | Disposition: A | Discharge status: Home / Self Care | Payer: Medicare HMO | Source: Ambulatory Visit | Attending: Neurosurgery | Admitting: Neurosurgery

## 2017-12-31 DIAGNOSIS — M48062 Spinal stenosis, lumbar region with neurogenic claudication: Secondary | ICD-10-CM

## 2017-12-31 MED ORDER — IOPAMIDOL (ISOVUE-M 200) INJECTION 41%
1.0000 mL | Freq: Once | INTRAMUSCULAR | Status: AC
  Administered 2017-12-31: 1 mL via EPIDURAL

## 2017-12-31 MED ORDER — METHYLPREDNISOLONE ACETATE 40 MG/ML INJ SUSP (RADIOLOG
120.0000 mg | Freq: Once | INTRAMUSCULAR | Status: AC
  Administered 2017-12-31: 120 mg via EPIDURAL

## 2017-12-31 NOTE — Discharge Instructions (Signed)

## 2018-01-02 ENCOUNTER — Other Ambulatory Visit: Payer: Self-pay

## 2018-01-02 ENCOUNTER — Ambulatory Visit (INDEPENDENT_AMBULATORY_CARE_PROVIDER_SITE_OTHER): Payer: Medicare HMO | Admitting: Internal Medicine

## 2018-01-02 VITALS — BP 163/92 | HR 68 | Temp 98.7°F | Ht 65.0 in | Wt 183.2 lb

## 2018-01-02 DIAGNOSIS — D649 Anemia, unspecified: Secondary | ICD-10-CM

## 2018-01-02 DIAGNOSIS — Z1231 Encounter for screening mammogram for malignant neoplasm of breast: Secondary | ICD-10-CM

## 2018-01-02 DIAGNOSIS — I10 Essential (primary) hypertension: Secondary | ICD-10-CM | POA: Diagnosis not present

## 2018-01-02 DIAGNOSIS — Z79899 Other long term (current) drug therapy: Secondary | ICD-10-CM | POA: Diagnosis not present

## 2018-01-02 DIAGNOSIS — F1410 Cocaine abuse, uncomplicated: Secondary | ICD-10-CM

## 2018-01-02 DIAGNOSIS — R0602 Shortness of breath: Secondary | ICD-10-CM | POA: Diagnosis not present

## 2018-01-02 MED ORDER — AMLODIPINE BESYLATE 5 MG PO TABS: 5.0000 mg | ORAL_TABLET | Freq: Every day | ORAL | 3 refills | Status: DC

## 2018-01-02 MED ORDER — ALBUTEROL SULFATE HFA 108 (90 BASE) MCG/ACT IN AERS: 1.0000 | INHALATION_SPRAY | Freq: Four times a day (QID) | RESPIRATORY_TRACT | 11 refills | Status: DC | PRN

## 2018-01-02 NOTE — Assessment & Plan Note (Addendum)
Last hemoglobin 10.8 and ferritin 40 when checked when checked two years ago. She describes symptoms of fatigue. She denies signs of blood loss. She has stopped taking iron tablets, she has been off of them for the past few months. If ferritin is not found to be low, will need to consider other causes of anemia.  - follow up CBC and ferritin   ADDENDUM: CBC with persistence of normocytic anemia with normal RDW. Will proceed with workup for normocytic anemia including LDH, reticulocyte count and review of the blood smear.   ADDENDUM 12/5: Reticulocyte index is inapropriately low at 0.7 indicating that impaired production is contributing to this anemia. A smear was reviewed by our pathologist and had no mention of abnormal cells, the differential reflects the same. Causes of low reticulocyte index could be Vitamin B12 deficiency, pure red cell aplasia, or low erythropoetin. Further testing should be considered at follow up or if the hemoglobin becomes significantly reduced.

## 2018-01-02 NOTE — Assessment & Plan Note (Signed)
Blood pressure is uncontrolled today 150/95, 156/61 on recheck. She took antihypertensives 30 minutes prior to the visit. She describes having elevated blood pressures 170 at an outside office visit, again just prior to this visit she took her antihypertensive. She has purchased a blood pressure cuff and plans to start a blood pressure log at home. In terms of secondary causes, she is under a great deal of stress in her life. She also describes eating about half a family size bag of potatoe chips per day. She feels motivated to cut back the chips and has decided that she will start with buying smaller bags. She also purchased a [employer] recently and plans to start exercising. She describes snoring in her sleep, she does wake up with morning headaches and has excessive daytime fatigue these symptoms put her at intermediate risk for OSA.  - continue lisinopril - HCTZ 20-12.5 - start amlodipine 5 mg daily  - discussed diet modification and exercise  - referral for home sleep test - RTC in 1 month for follow up of blood pressure log

## 2018-01-02 NOTE — Progress Notes (Addendum)
CC: follow up of hypertension   HPI:  [name] is a 67 y.o. with PMH as listed below who presents for follow up of hypertension.   Please see the assessment and plans for the status of the patient chronic medical problems.    Past Medical History:  Diagnosis Date  . Anemia   . Arthritis    knees hands  . Bipolar disorder ([place])   . Cervicalgia   . Depression   . Environmental allergies    cause SOB, uses inhaler for  . Environmental and seasonal allergies    uses inhaler prn  . Full dentures   . GERD (gastroesophageal reflux disease)    diet controlled - no meds  . Hyperlipidemia    diet controlled, no meds  . Hypertension   . Lumbar radiculopathy, chronic   . Right knee pain    posterior horn medial meniscal tear MRI 2013  . Spinal stenosis    getting epidural injections -last one 06/12/2015  . SVD (spontaneous vaginal delivery)    x 4   Review of Systems:  Refer to history of present illness and assessment and plans for pertinent review of systems, all others reviewed and negative  Physical Exam:  Vitals:   01/02/18 1040 01/02/18 1050  BP: (!) 150/95 (!) 163/92  Pulse: 68   Temp: 98.7 F (37.1 C)   TempSrc: Oral   SpO2: 100%   Weight: 183 lb 3.2 oz (83.1 kg)   Height: 5\' 5"  (1.651 m)    General: well appearing, no acute distress  Cardiac: regular rate and rhythm, no murmurs, rubs or gallops, no peripheral edema  Pulm: normal work of breathing, lungs are clear to auscultation   Assessment & Plan:   Hypertension  Blood pressure is uncontrolled today 150/95, 156/61 on recheck. She took antihypertensives 30 minutes prior to the visit. She describes having elevated blood pressures 170 at an outside office visit, again just prior to this visit she took her antihypertensive. She has purchased a blood pressure cuff and plans to start a blood pressure log at home. In terms of secondary causes, she is under a great deal of stress in her life. She also  describes eating about half a family size bag of potatoe chips per day. She feels motivated to cut back the chips and has decided that she will start with buying smaller bags. She also purchased a [employer] recently and plans to start exercising. She describes snoring in her sleep, she does wake up with morning headaches and has excessive daytime fatigue these symptoms put her at intermediate risk for OSA.  - continue lisinopril - HCTZ 20-12.5 - start amlodipine 5 mg daily  - discussed diet modification and exercise  - referral for home sleep test - RTC this week for follow up BMP lab only appointment, I have asked the front desk to help with scheduling this  - RTC in 1 month for follow up of blood pressure log    ADDENDUM: insurance wouldn't cover home sleep study so this will be changed to an outpatient split night sleep study at [facility]. Creatinine is 1.15 this is increased from 0.96 when last checked two years ago. Its difficult to say if this represents CKD 3 or an acute worsening of her renal function and repeat BMP testing at follow up would help to further differentiate this.   Normocytic Anemia  Last hemoglobin 10.8 and ferritin 40 when checked when checked two years ago. She  describes symptoms of fatigue. She denies signs of blood loss. She has stopped taking iron tablets, she has been off of them for the past few months. If ferritin is not found to be low, will need to consider other causes of anemia.  - follow up CBC and ferritin   ADDENDUM: CBC with persistence of normocytic anemia with normal RDW. Will proceed with workup for normocytic anemia including LDH, reticulocyte count and review of the blood smear.   ADDENDUM 12/5: Reticulocyte index is inapropriately low at 0.7 indicating that impaired production is contributing to this anemia. A smear was reviewed by our pathologist and had no mention of abnormal cells, the differential reflects the same. Causes of low reticulocyte  index could be Vitamin B12 deficiency, pure red cell aplasia, or low erythropoetin. Further testing should be considered at follow up or if the hemoglobin becomes significantly reduced.    Intermittent shortness of breath  Notes intermittent symptoms of lungs tightening associated with shortness of breath when she is exposed to strong smells or dust. She has used albuterol to manage these symptoms, she only requires this with exposure and has not used it for the past month. Symptoms could be consistent with intermittent asthma, if this is the case proceeding with albuterol would be fine. She has no prior PFTs in our system so we should obtain these today just to be sure she doesn't have obstruction or diffusion abnormalities.  - refilled albuterol  - ordered pulmonary function testing  Preventative Health  - agreed to flu vaccine and PCV 13 but left before these could be obtained  - referral placed for mammogram  See Encounters Tab for problem based charting.  Patient discussed with Dr. Evette Doffing

## 2018-01-02 NOTE — Assessment & Plan Note (Signed)
Notes intermittent symptoms of lungs tightening associated with shortness of breath when she is exposed to strong smells or dust. She has used albuterol to manage these symptoms, she only requires this with exposure and has not used it for the past month. Symptoms could be consistent with intermittent asthma, if this is the case proceeding with albuterol would be fine. She has no prior PFTs in our system so we should obtain these today just to be sure she doesn't have obstruction or diffusion abnormalities.  - refilled albuterol  - ordered pulmonary function testing

## 2018-01-02 NOTE — Patient Instructions (Addendum)
Thank you for coming to the clinic today. It was a pleasure to see you.   For your high blood pressure  Cut back on the chips and get to the gym, try water exercises!  Start taking amlodipine daily  Continue taking the lisinopril- HCTZ   Please set up your mammogram  Today you received your flu and pneumonia   FOLLOW-UP INSTRUCTIONS When: 1-2 months with Dr. Shan Levans  For: follow up of your hypertension   Please call the internal medicine center clinic if you have any questions or concerns, we may be able to help and keep you from a long and expensive emergency room wait. Our clinic and after hours phone number is [phone], the best time to call is Monday through Friday 9 am to 4 pm but there is always someone available 24/7 if you have an emergency. If you need medication refills please notify your pharmacy one week in advance and they will send [name] a request.

## 2018-01-03 LAB — CBC
HEMATOCRIT: 32.7 % — AB (ref 34.0–46.6)
HEMOGLOBIN: 10.5 g/dL — AB (ref 11.1–15.9)
MCH: 26.4 pg — ABNORMAL LOW (ref 26.6–33.0)
MCHC: 32.1 g/dL (ref 31.5–35.7)
MCV: 82 fL (ref 79–97)
Platelets: 413 10*3/uL (ref 150–450)
RBC: 3.97 x10E6/uL (ref 3.77–5.28)
RDW: 14.3 % (ref 12.3–15.4)
WBC: 10 10*3/uL (ref 3.4–10.8)

## 2018-01-03 LAB — FERRITIN: Ferritin: 53 ng/mL (ref 15–150)

## 2018-01-05 NOTE — Progress Notes (Signed)
Internal Medicine Clinic Attending  Case discussed with Dr. Blum at the time of the visit.  We reviewed the resident's history and exam and pertinent patient test results.  I agree with the assessment, diagnosis, and plan of care documented in the resident's note. 

## 2018-01-12 ENCOUNTER — Ambulatory Visit: Payer: Medicare HMO

## 2018-01-12 ENCOUNTER — Other Ambulatory Visit: Payer: Medicare HMO

## 2018-01-12 NOTE — Addendum Note (Signed)
Addended by: Meryl Dare on: 01/12/2018 09:49 AM   Modules accepted: Orders

## 2018-01-15 ENCOUNTER — Ambulatory Visit (INDEPENDENT_AMBULATORY_CARE_PROVIDER_SITE_OTHER): Payer: Medicare HMO | Admitting: *Deleted

## 2018-01-15 ENCOUNTER — Other Ambulatory Visit (INDEPENDENT_AMBULATORY_CARE_PROVIDER_SITE_OTHER): Payer: Medicare HMO

## 2018-01-15 ENCOUNTER — Telehealth: Payer: Self-pay | Admitting: Internal Medicine

## 2018-01-15 DIAGNOSIS — I10 Essential (primary) hypertension: Secondary | ICD-10-CM | POA: Diagnosis not present

## 2018-01-15 DIAGNOSIS — Z23 Encounter for immunization: Secondary | ICD-10-CM | POA: Diagnosis not present

## 2018-01-15 DIAGNOSIS — D649 Anemia, unspecified: Secondary | ICD-10-CM | POA: Diagnosis not present

## 2018-01-15 LAB — CBC WITH DIFFERENTIAL/PLATELET
Abs Immature Granulocytes: 0.03 10*3/uL (ref 0.00–0.07)
BASOS ABS: 0.1 10*3/uL (ref 0.0–0.1)
BASOS PCT: 1 %
EOS PCT: 3 %
Eosinophils Absolute: 0.2 10*3/uL (ref 0.0–0.5)
HCT: 36.3 % (ref 36.0–46.0)
Hemoglobin: 10.9 g/dL — ABNORMAL LOW (ref 12.0–15.0)
Immature Granulocytes: 0 %
LYMPHS PCT: 27 %
Lymphs Abs: 1.9 10*3/uL (ref 0.7–4.0)
MCH: 26.1 pg (ref 26.0–34.0)
MCHC: 30 g/dL (ref 30.0–36.0)
MCV: 86.8 fL (ref 80.0–100.0)
Monocytes Absolute: 0.7 10*3/uL (ref 0.1–1.0)
Monocytes Relative: 10 %
NRBC: 0 % (ref 0.0–0.2)
Neutro Abs: 4.1 10*3/uL (ref 1.7–7.7)
Neutrophils Relative %: 59 %
PLATELETS: 353 10*3/uL (ref 150–400)
RBC: 4.18 MIL/uL (ref 3.87–5.11)
RDW: 15.4 % (ref 11.5–15.5)
WBC: 6.9 10*3/uL (ref 4.0–10.5)

## 2018-01-15 LAB — RETICULOCYTES
IMMATURE RETIC FRACT: 6 % (ref 2.3–15.9)
RBC.: 4.18 MIL/uL (ref 3.87–5.11)
Retic Count, Absolute: 56.4 10*3/uL (ref 19.0–186.0)
Retic Ct Pct: 1.4 % (ref 0.4–3.1)

## 2018-01-15 NOTE — Telephone Encounter (Signed)
rtc to terry, she states pt insurance will not approve home sleep study, needs one done at [place], you may call terry at [phone]. Sending to dr's blum, winfrey and to chilon. Please call her and assist with this study

## 2018-01-15 NOTE — Telephone Encounter (Signed)
Terry from Sleep study would like a nurse to callback, [phone]

## 2018-01-15 NOTE — Addendum Note (Signed)
Addended by: Truddie Crumble on: 01/15/2018 02:52 PM   Modules accepted: Orders

## 2018-01-15 NOTE — Addendum Note (Signed)
Addended by: Meryl Dare on: 01/15/2018 11:48 AM   Modules accepted: Orders

## 2018-01-15 NOTE — Telephone Encounter (Signed)
Thank you for letting me know. I have cancelled the home sleep study and placed an order for a split night study, sent to [facility].

## 2018-01-15 NOTE — Addendum Note (Signed)
Addended by: Truddie Crumble on: 01/15/2018 05:04 PM   Modules accepted: Orders

## 2018-01-15 NOTE — Addendum Note (Signed)
Addended by: Meryl Dare on: 01/15/2018 07:22 AM   Modules accepted: Orders

## 2018-01-16 LAB — BMP8+ANION GAP
ANION GAP: 18 mmol/L (ref 10.0–18.0)
BUN/Creatinine Ratio: 16 (ref 12–28)
BUN: 18 mg/dL (ref 8–27)
CO2: 21 mmol/L (ref 20–29)
CREATININE: 1.15 mg/dL — AB (ref 0.57–1.00)
Calcium: 10.1 mg/dL (ref 8.7–10.3)
Chloride: 100 mmol/L (ref 96–106)
GFR calc Af Amer: 57 mL/min/{1.73_m2} — ABNORMAL LOW (ref >59)
GFR calc non Af Amer: 50 mL/min/{1.73_m2} — ABNORMAL LOW (ref >59)
Glucose: 99 mg/dL (ref 65–99)
Potassium: 4.8 mmol/L (ref 3.5–5.2)
SODIUM: 139 mmol/L (ref 134–144)

## 2018-01-16 LAB — LACTATE DEHYDROGENASE: LDH: 251 IU/L — ABNORMAL HIGH (ref 119–226)

## 2018-01-16 LAB — PATHOLOGIST SMEAR REVIEW

## 2018-01-21 ENCOUNTER — Telehealth: Payer: Self-pay

## 2018-01-21 NOTE — Telephone Encounter (Signed)
Requesting to speak with Olivia Ewing. Please call pt back.  

## 2018-01-21 NOTE — Telephone Encounter (Signed)
Pt calls and states since flu shot 12/5 the side of her body that she got flu shot feels "miserable", feels weak, states other side doesn't feel that bad. Denies h/a, chest pain, short of breath, change in speech and mobility, no change in strength. Just feels bad. She is offered appt today and refuses. appt 12/12 at [address] [place] for eval. She is ask to call 911 if any above happen and come to ED

## 2018-01-21 NOTE — Telephone Encounter (Signed)
Agree. Thanks

## 2018-01-22 ENCOUNTER — Ambulatory Visit: Payer: Medicare HMO

## 2018-02-16 ENCOUNTER — Other Ambulatory Visit: Payer: Self-pay | Admitting: Internal Medicine

## 2018-02-16 DIAGNOSIS — I10 Essential (primary) hypertension: Secondary | ICD-10-CM

## 2018-02-17 ENCOUNTER — Telehealth: Payer: Self-pay | Admitting: *Deleted

## 2018-02-17 NOTE — Telephone Encounter (Signed)
Call from pt - stated she's in a lot of pain,aching all over/ crying; unable to sleep at night; this has been going on x 2 weeks. Stated she's unsure if the pain is related to the flu/pna vaccines she received last month or the spinal stenosis. Dr Cyndy Freeze will not see her until she pays $100 for an missed appt. She does not have any pain medication. We do not have any appts until Friday - which the front office has schedule in [facility]. Suggested going to UC or ER; stated if she cannot wait until Friday, she will go to UC.

## 2018-02-17 NOTE — Telephone Encounter (Signed)
refilled 

## 2018-02-17 NOTE — Telephone Encounter (Signed)
[facility] appt on Friday.

## 2018-02-19 NOTE — Telephone Encounter (Signed)
I agree, pt with vague symptoms was given flu vaccine almost one month ago when symptoms began and refused evaluation in our clinic.  Same similar vague symptoms now, no role for empiric therapy needs an in person evaluation of some kind.

## 2018-02-20 ENCOUNTER — Encounter (INDEPENDENT_AMBULATORY_CARE_PROVIDER_SITE_OTHER): Payer: Self-pay

## 2018-02-20 ENCOUNTER — Ambulatory Visit (INDEPENDENT_AMBULATORY_CARE_PROVIDER_SITE_OTHER): Payer: Medicare HMO | Admitting: Internal Medicine

## 2018-02-20 ENCOUNTER — Other Ambulatory Visit: Payer: Self-pay

## 2018-02-20 VITALS — BP 133/84 | HR 78 | Temp 98.0°F | Ht 65.0 in | Wt 182.0 lb

## 2018-02-20 DIAGNOSIS — G8929 Other chronic pain: Secondary | ICD-10-CM

## 2018-02-20 DIAGNOSIS — D649 Anemia, unspecified: Secondary | ICD-10-CM

## 2018-02-20 DIAGNOSIS — K219 Gastro-esophageal reflux disease without esophagitis: Secondary | ICD-10-CM

## 2018-02-20 DIAGNOSIS — I10 Essential (primary) hypertension: Secondary | ICD-10-CM

## 2018-02-20 DIAGNOSIS — F329 Major depressive disorder, single episode, unspecified: Secondary | ICD-10-CM

## 2018-02-20 DIAGNOSIS — M48061 Spinal stenosis, lumbar region without neurogenic claudication: Secondary | ICD-10-CM

## 2018-02-20 DIAGNOSIS — Z8742 Personal history of other diseases of the female genital tract: Secondary | ICD-10-CM

## 2018-02-20 DIAGNOSIS — N393 Stress incontinence (female) (male): Secondary | ICD-10-CM

## 2018-02-20 DIAGNOSIS — F419 Anxiety disorder, unspecified: Secondary | ICD-10-CM

## 2018-02-20 DIAGNOSIS — E785 Hyperlipidemia, unspecified: Secondary | ICD-10-CM

## 2018-02-20 MED ORDER — HYDROCODONE-ACETAMINOPHEN 5-325 MG PO TABS: 1.0000 | ORAL_TABLET | Freq: Four times a day (QID) | ORAL | 0 refills | Status: DC | PRN

## 2018-02-20 NOTE — Progress Notes (Signed)
CC: Right Hip Pain  HPI: [name] is a 68 y.o. F w/ PMH of spinal stenosis, HLD, GERD, HTN and normocytic anemia presenting with complaints of worsening back and hip pain. She was in her usual state of health until about 2 weeks ago she began to have increasing back and right hip pain.  He has known well, well-documented spinal stenosis and herniated disc of lumbar spine treated with epidural injections.  She had her last injection on 12/31/2017.  He states her pain is usually well controlled but over the last 2 weeks it has increased significantly to " 10/10 worst pain ever."  She states her pain appeared to originate near her lumbar spine and radiates down her right leg as well as right-sided groin region.  She also endorsed tingling and numbness around her groin area.  She mentions pain significant enough to interfere with mobility and has been requiring heavy cane use.  She also mentions that she is has been endorsing urinary incontinence around this time.  It is she describes it as feeling the urge to urinate but unable to hold her urine until she gets to the bathroom.  She mentions that she has had prior conversation about surgical intervention for her spinal stenosis but has had fear of complications including becoming wheel chair bound and loss of independence. She denies any left sided weakness, stool incontinence, saddle anesthesia, headache, blurry vision, or loss of consciousness.  Past Medical History:  Diagnosis Date  . Anemia   . Arthritis    knees hands  . Bipolar disorder ([place])   . Cervicalgia   . Depression   . Environmental allergies    cause SOB, uses inhaler for  . Environmental and seasonal allergies    uses inhaler prn  . Full dentures   . GERD (gastroesophageal reflux disease)    diet controlled - no meds  . Hyperlipidemia    diet controlled, no meds  . Hypertension   . Lumbar radiculopathy, chronic   . Right knee pain    posterior horn medial meniscal  tear MRI 2013  . Spinal stenosis    getting epidural injections -last one 06/12/2015  . SVD (spontaneous vaginal delivery)    x 4   Review of Systems: Review of Systems  Constitutional: Negative for chills, fever, malaise/fatigue and weight loss.  Respiratory: Negative for shortness of breath.   Cardiovascular: Negative for chest pain and palpitations.  Gastrointestinal: Negative for constipation, diarrhea, nausea and vomiting.  Genitourinary: Positive for frequency and urgency. Negative for dysuria, flank pain and hematuria.  Musculoskeletal: Positive for back pain and joint pain. Negative for falls, myalgias and neck pain.  Neurological: Positive for tingling, sensory change and focal weakness. Negative for dizziness, tremors, seizures, loss of consciousness, weakness and headaches.  Psychiatric/Behavioral: Positive for depression. The patient is nervous/anxious.      Physical Exam: Vitals:   02/20/18 0952  BP: 133/84  Pulse: 78  Temp: 98 F (36.7 C)  TempSrc: Oral  SpO2: 100%  Weight: 182 lb (82.6 kg)  Height: 5\' 5"  (1.651 m)    Physical Exam  Constitutional: She appears well-developed and well-nourished. She appears distressed (Tearful).  HENT:  Head: Atraumatic.  Mouth/Throat: Oropharynx is clear and moist.  Eyes: Conjunctivae are normal. No scleral icterus.  Neck: Normal range of motion. Neck supple.  Cardiovascular: Normal rate, regular rhythm, normal heart sounds and intact distal pulses.  No murmur heard. Respiratory: Effort normal and breath sounds normal. She has no  wheezes. She has no rales.  GI: Soft. Bowel sounds are normal. There is no abdominal tenderness.  Musculoskeletal:        General: Tenderness (Both active and passive Right sided hip and spinal ROM significantly limited due to pain. Right sided hip abduction and  flexion minimal at <20 degrees. + R straight leg test) present. No deformity or edema.  Neurological:  Neurologic exam: Mental status:  A&Ox3 Cranial Nerves: II: PERRL III, IV, VI: Extra-occular motions intact bilaterally V, VII: Face symmetric, sensation intact in all 3 divisions  VIII: hearing normal to rubbing fingers bilaterally  IX, X: palate rises symmetrically XI: Head turn and shoulder shrug normal bilaterally  XII: tongue midline  Motor: Strength 3/5 on RLE dorsiflexion and plantarflexion. 5/5 on LLE, bilateral upper extremities, Muscle spasms noted of right lower extremities during range of motion testing. Gait:Antalgic gait favoring left side Sensory: Light touch intact and symmetric bilaterally  Coordination: There is no dysmetria on finger-to-nose.    Skin: Skin is warm and dry.  Psychiatric:  Depressed mood      Assessment & Plan:   Spinal stenosis of lumbar region Olivia Ewing presents with acute on chronic back and hip pain 2/2 spinal stenosis + multilevel disc protrusions. She has significant worsening of her pain concerning for complications previously managed by epidural injection. She has refused surgical intervention in the past. Used to see neurosurgery with Dr. Christella Noa but states she would like to see Dr.Beane after recommendation from her sister. Despite worsening pain, she is able to ambulate with cane and urinary incontinence appear to be more due to stress incontinence. Does not appear to have cauda equina syndrome. She states with these recent worsening of her symptoms she is now more open to surgical intervention.  - Referral to ortho (emergortho per patient request) - Hydrocodone-acetaminophen 5-325mg  q6hr PRN for 5 days (20 tablets) for acute pain  Stress incontinence of urine Endorsing urinary incontinence exacerbated by low back/ hip pain. States when she feels signal to urinate, she has difficulty holding her urine before she gets to the bathroom. 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 Post-void residual showed volume of 107. Denies any significant urgency or frequency. States she was unable to follow up with ob/gyn due to lack of coverage. However, recently she received dual coverage after becoming 66.   - Will make referral for Ob/Gyn    Patient discussed with Dr. Evette Doffing   -Gilberto Better, PGY1

## 2018-02-20 NOTE — Assessment & Plan Note (Addendum)
Olivia Ewing presents with acute on chronic back and hip pain 2/2 spinal stenosis + multilevel disc protrusions. She has significant worsening of her pain concerning for complications previously managed by epidural injection. She has refused surgical intervention in the past. Used to see neurosurgery with Dr. Christella Noa but states she would like to see Dr.Beane after recommendation from her sister. Despite worsening pain, she is able to ambulate with cane and urinary incontinence appear to be more due to stress incontinence. Does not appear to have cauda equina syndrome. She states with these recent worsening of her symptoms she is now more open to surgical intervention.  - Referral to ortho (emergortho per patient request) - Hydrocodone-acetaminophen 5-325mg  q6hr PRN for 5 days (20 tablets) for acute pain

## 2018-02-20 NOTE — Progress Notes (Signed)
Internal Medicine Clinic Attending ° °Case discussed with Dr. Lee at the time of the visit.  We reviewed the resident’s history and exam and pertinent patient test results.  I agree with the assessment, diagnosis, and plan of care documented in the resident’s note.  °

## 2018-02-20 NOTE — Patient Instructions (Addendum)
Olivia Ewing  You came to [name] for hip pain. Here are our recommendations:  - Referral to orthopedics - Please take hydrocodone-acetaminophen 1 tablet every 6 hours for 5 days  Thank you for visiting the clinic.   Spinal Stenosis  Spinal stenosis happens when the open space (spinal canal) between the bones of your spine (vertebrae) gets smaller. It is caused by bone pushing into the open spaces of your backbone (spine). This puts pressure on your backbone and the nerves in your backbone. Treatment often focuses on managing any pain and symptoms. In some cases, surgery may be needed. Follow these instructions at home: Managing pain, stiffness, and swelling   Do all exercises and stretches as told by your doctor.  Stand and sit up straight (use good posture). If you were given a brace or a corset, wear it as told by your doctor.  Do not do any activities that cause pain. Ask your doctor what activities are safe for you.  Do not lift anything that is heavier than 10 lb (4.5 kg) or heavier than your doctor tells you.  Try to stay at a healthy weight. Talk with your doctor if you need help losing weight.  If directed, put heat on the affected area as often as told by your doctor. Use the heat source that your doctor recommends, such as a moist heat pack or a heating pad. ? Put a towel between your skin and the heat source. ? Leave the heat on for 20-30 minutes. ? Remove the heat if your skin turns bright red. This is especially important if you are not able to feel pain, heat, or cold. You may have a greater risk of getting burned. General instructions  Take over-the-counter and prescription medicines only as told by your doctor.  Do not use any products that contain nicotine or tobacco, such as cigarettes and e-cigarettes. If you need help quitting, ask your doctor.  Eat a healthy diet. This includes plenty of fruits and vegetables, whole grains, and low-fat (lean) protein.  Keep all  follow-up visits as told by your doctor. This is important. Contact a doctor if:  Your symptoms do not get better.  Your symptoms get worse.  You have a fever. Get help right away if:  You have new or worse pain in your neck or upper back.  You have very bad pain that medicine does not control.  You are dizzy.  You have vision problems, blurred vision, or double vision.  You have a very bad headache that is worse when you stand.  You feel sick to your stomach (nauseous).  You throw up (vomit).  You have new or worse numbness or tingling in your back or legs.  You have pain, redness, swelling, or warmth in your arm or leg. Summary  Spinal stenosis happens when the open space (spinal canal) between the bones of your spine gets smaller (narrow).  Contact a doctor if your symptoms get worse.  In some cases, surgery may be needed. This information is not intended to replace advice given to you by your health care provider. Make sure you discuss any questions you have with your health care provider. Document Released: 05/24/2010 Document Revised: 01/03/2016 Document Reviewed: 01/03/2016 Elsevier Interactive Patient Education  [employer].

## 2018-02-20 NOTE — Assessment & Plan Note (Signed)
Endorsing urinary incontinence exacerbated by low back/ hip pain. States when she feels signal to urinate, she has difficulty holding her urine before she gets to the bathroom. 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 Post-void residual showed volume of 107. Denies any significant urgency or frequency. States she was unable to follow up with ob/gyn due to lack of coverage. However, recently she received dual coverage after becoming 61.   - Will make referral for Ob/Gyn

## 2018-02-23 ENCOUNTER — Telehealth: Payer: Self-pay

## 2018-02-23 ENCOUNTER — Ambulatory Visit: Payer: Medicare HMO

## 2018-02-23 NOTE — Telephone Encounter (Signed)
TC received from pt, states she saw Dr. Truman Hayward in [facility] on 02/20/18 and c/o urinary "spasms".  Pt states spasms continue, requesting medication.  Pt states she has received toradol injection in the past for "urinary spasms" which provided relief.  Will forward to PCP SChaplin, RN,BSN

## 2018-02-23 NOTE — Addendum Note (Signed)
Addended by: Mosetta Anis on: 02/23/2018 08:40 AM   Modules accepted: Orders

## 2018-02-24 ENCOUNTER — Telehealth: Payer: Self-pay

## 2018-02-24 NOTE — Telephone Encounter (Signed)
Attempted to call patient on her mobile phone concerning her complaint of 'urinary spasm.' Patient did not pick up. Left voicemail regarding pending uro-gynecology referral ordered during her visit.

## 2018-02-24 NOTE — Telephone Encounter (Signed)
I will forward to Dr. Truman Ewing, in reviewing the note I do not see where any medication was discussed during his evaluation.

## 2018-02-24 NOTE — Telephone Encounter (Signed)
I'll give her a call back. She needs to go see urogynecology.

## 2018-02-24 NOTE — Telephone Encounter (Signed)
Pt called to speak with you about a referral for OBGYN. Please call pt back.

## 2018-02-25 ENCOUNTER — Ambulatory Visit (INDEPENDENT_AMBULATORY_CARE_PROVIDER_SITE_OTHER): Payer: Medicare HMO | Admitting: Internal Medicine

## 2018-02-25 ENCOUNTER — Other Ambulatory Visit: Payer: Self-pay

## 2018-02-25 VITALS — BP 149/91 | HR 95 | Temp 98.2°F | Ht 65.0 in | Wt 182.8 lb

## 2018-02-25 DIAGNOSIS — F329 Major depressive disorder, single episode, unspecified: Secondary | ICD-10-CM

## 2018-02-25 DIAGNOSIS — R350 Frequency of micturition: Secondary | ICD-10-CM

## 2018-02-25 DIAGNOSIS — Z9889 Other specified postprocedural states: Secondary | ICD-10-CM

## 2018-02-25 DIAGNOSIS — E785 Hyperlipidemia, unspecified: Secondary | ICD-10-CM

## 2018-02-25 DIAGNOSIS — N993 Prolapse of vaginal vault after hysterectomy: Secondary | ICD-10-CM

## 2018-02-25 DIAGNOSIS — K219 Gastro-esophageal reflux disease without esophagitis: Secondary | ICD-10-CM

## 2018-02-25 DIAGNOSIS — I10 Essential (primary) hypertension: Secondary | ICD-10-CM

## 2018-02-25 DIAGNOSIS — M4800 Spinal stenosis, site unspecified: Secondary | ICD-10-CM

## 2018-02-25 DIAGNOSIS — Z90710 Acquired absence of both cervix and uterus: Secondary | ICD-10-CM

## 2018-02-25 DIAGNOSIS — N3946 Mixed incontinence: Secondary | ICD-10-CM

## 2018-02-25 DIAGNOSIS — N393 Stress incontinence (female) (male): Secondary | ICD-10-CM

## 2018-02-25 DIAGNOSIS — Z8742 Personal history of other diseases of the female genital tract: Secondary | ICD-10-CM

## 2018-02-25 NOTE — Progress Notes (Signed)
CC: Urinary Incontinence  HPI: Olivia Ewing is a 68 y.o. F w/ PMH of spinal stenosis, HLD, GERD, HTN and anemia presenting with complaints of urinary incontinence and groin fullness. She was seen in clinic for the same issue and was recommended to f/u with urogyn for her stress incontinence. She states she has hx of uterovaginal prolapse and cystocele for  which she underwent vaginal hysterctomy and colporrhaphy performed in 2008. During her last visit she had post-void residual of 107. She states since last week, she has continued to endorse urinary incontinence where she is unable to hold on to urine before she gets to the bathroom. She also mentions significant feeling of pelvic fullness described as 'feels like I am pregnant again.' She wanted to know if there are any medications to help her discomfort and incontinence while awaiting gyn referral. She mentions that in the past she received IV toradol for her 'urinary spasms' and wanted to know if it is indicated again. She denies any dysuria or hematuria. States she does endorse frequency and urgency.   Past Medical History:  Diagnosis Date  . Anemia   . Arthritis    knees hands  . Bipolar disorder ([place])   . Cervicalgia   . Depression   . Environmental allergies    cause SOB, uses inhaler for  . Environmental and seasonal allergies    uses inhaler prn  . Full dentures   . GERD (gastroesophageal reflux disease)    diet controlled - no meds  . Hyperlipidemia    diet controlled, no meds  . Hypertension   . Lumbar radiculopathy, chronic   . Right knee pain    posterior horn medial meniscal tear MRI 2013  . Spinal stenosis    getting epidural injections -last one 06/12/2015  . SVD (spontaneous vaginal delivery)    x 4    Review of Systems: Review of Systems  Constitutional: Negative for chills and fever.  Respiratory: Negative for shortness of breath.   Cardiovascular: Negative for chest pain and palpitations.    Gastrointestinal: Negative for abdominal pain, constipation, diarrhea, nausea and vomiting.  Genitourinary: Positive for frequency and urgency. Negative for dysuria, flank pain and hematuria.  Musculoskeletal: Positive for back pain and joint pain.  Neurological: Positive for tingling, sensory change and focal weakness. Negative for headaches.  Psychiatric/Behavioral: Positive for depression. The patient is nervous/anxious.      Physical Exam: Vitals:   02/25/18 1503  BP: (!) 149/91  Pulse: 95  Temp: 98.2 F (36.8 C)  TempSrc: Oral  SpO2: 100%  Weight: 182 lb 12.8 oz (82.9 kg)  Height: 5\' 5"  (1.651 m)    Physical Exam  Constitutional: She is oriented to person, place, and time. She appears well-developed and well-nourished. She appears distressed (tearful and anxious).  Cardiovascular: Normal rate, regular rhythm, normal heart sounds and intact distal pulses.  No murmur heard. Respiratory: Effort normal and breath sounds normal. She has no rales.  GI: Soft. Bowel sounds are normal. She exhibits no distension. There is no abdominal tenderness.  Genitourinary:    Vagina normal.     No vaginal discharge.     Genitourinary Comments: Prolapsing mass protruding from introitus   Musculoskeletal: Normal range of motion.        General: Tenderness present. No edema.  Neurological: She is alert and oriented to person, place, and time.      Assessment & Plan:   Stress incontinence of urine Presents with complaints of urinary incontinence  and abdominal fullness. She was previously evaluated by ob/gyn in 12/2017 with referral for urogynecology made. She was came to the clinic last week with same complaint. Discussed that this is a surgical issue and medications will not be able to fix this. She specifically asks for IV toradol for her discomfort, stating that this is the only medication that has helped her in the past. Physical exam confirms the prolapsing mass and reassured her that IV  toradol will not be effective in treating her incontinence and discomfort as this needs to be surgically managed.  - F/u urogyn referral - UA    Patient discussed with Dr. Rebeca Alert   -Gilberto Better, PGY1

## 2018-02-25 NOTE — Patient Instructions (Signed)
Thank you for allowing [name] to provide your care today. Today we discussed about your urinary spasms. I examined you today and saw that you have significant vaginal prolapse which is causing your incontinence. For this disease, toradol would not be helpful and at most will only provide temporary relief. Please try to to get an appointment at the [facility] for gynecology. Thank you.  I have ordered urinary analysis labs for you. I will call if any are abnormal.    Today we made no changes to your medications.    Please follow-up after your visits with orthopedics and gynecology.    Should you have any questions or concerns please call the internal medicine clinic at [phone].     Pelvic Floor Dysfunction  Pelvic floor dysfunction (PFD) is a condition that results when the group of muscles and connective tissues that support the organs in the pelvis (pelvic floor muscles) do not work well. These muscles and their connections form a sling that supports the colon and bladder. In men, these muscles also support the prostate gland. In women, they also support the uterus. PFD causes pelvic floor muscles to be too weak, too tight, or a combination of both. In PFD, muscle movements are not coordinated. This condition may cause bowel or bladder problems. It may also cause pain. What are the causes? This condition may be caused by an injury to the pelvic area or by a weakening of pelvic muscles. This often results from pregnancy and childbirth or other types of strain. In many cases, the exact cause is not known. What increases the risk? The following factors may make you more likely to develop this condition:  Having a condition of chronic bladder tissue inflammation (interstitial cystitis).  Being an older person.  Being overweight.  Radiation treatment for cancer in the pelvic region.  Previous pelvic surgery, such as removal of the uterus (hysterectomy) or prostate gland  (prostatectomy). What are the signs or symptoms? Symptoms of this condition vary and may include:  Bladder symptoms, such as: ? Trouble starting urination and emptying the bladder. ? Frequent urinary tract infections. ? Leaking urine when coughing, laughing, or exercising (stress incontinence). ? Having to pass urine urgently or frequently. ? Pain when passing urine.  Bowel symptoms, such as: ? Constipation. ? Urgent or frequent bowel movements. ? Incomplete bowel movements. ? Painful bowel movements. ? Leaking stool or gas.  Unexplained genital or rectal pain.  Genital or rectal muscle spasms.  Low back pain. In women, symptoms of PFD may also include:  A heavy, full, or aching feeling in the vagina.  A bulge that protrudes into the vagina.  Pain during or after sexual intercourse. How is this diagnosed? This condition may be diagnosed based on:  Your symptoms and medical history.  A physical exam. During the exam, your health care provider may check your pelvic muscles for tightness, spasm, pain, or weakness. This may include a rectal exam and a pelvic exam for women. In some cases, you may have diagnostic tests, such as:  Electrical muscle function tests.  Urine flow testing.  X-ray tests of bowel function.  Ultrasound of the pelvic organs. How is this treated? Treatment for this condition depends on your symptoms. Treatment options include:  Physical therapy. This may include Kegel exercises to help relax or strengthen the pelvic floor muscles.  Biofeedback. This type of therapy provides feedback on how tight your pelvic floor muscles are so that you can learn to control them.  Internal or external massage therapy.  A treatment that involves electrical stimulation of the pelvic floor muscles to help control pain (transcutaneous electrical nerve stimulation, or TENS).  Sound wave therapy (ultrasound) to reduce muscle spasms.  Medicines, such as: ? Muscle  relaxants. ? Bladder control medicines. Surgery to reconstruct or support pelvic floor muscles may be an option if other treatments do not help. Follow these instructions at home: Activity  Do your usual activities as told by your health care provider. Ask your health care provider if you should modify any activities.  Do pelvic floor strengthening or relaxing exercises at home as told by your physical therapist. Lifestyle  Maintain a healthy weight.  Eat foods that are high in fiber, such as beans, whole grains, and fresh fruits and vegetables.  Limit foods that are high in fat and processed sugars, such as fried or sweet foods.  Manage stress with relaxation techniques such as yoga or meditation. General instructions  If you have problems with leakage: ? Use absorbable pads or wear padded underwear. ? Wash frequently with mild soap. ? Keep your genital and anal area as clean and dry as possible. ? Ask your health care provider if you should try a barrier cream to prevent skin irritation.  Take warm baths to relieve pelvic muscle tension or spasms.  Take over-the-counter and prescription medicines only as told by your health care provider.  Keep all follow-up visits as told by your health care provider. This is important. Contact a health care provider if you:  Are not improving with home care.  Have signs or symptoms of PFD that get worse at home.  Develop new signs or symptoms at home.  Have signs of a urinary tract infection, such as: ? Fever. ? Chills. ? Urinary frequency. ? A burning feeling when urinating.  Have not had a bowel movement in 3 days (constipation). Summary  Pelvic floor dysfunction results when the muscles and connective tissues in your pelvic floor do not work well.  These muscles and their connections form a sling that supports your colon and bladder. In men, these muscles also support the prostate gland. In women, they also support the  uterus.  PFD may be caused by an injury to the pelvic area or by a weakening of pelvic muscles.  PFD causes pelvic floor muscles to be too weak, too tight, or a combination of both. Symptoms may vary from person to person.  In most cases, PFD can be treated with physical therapies and medicines. Surgery may be an option if other treatments do not help. This information is not intended to replace advice given to you by your health care provider. Make sure you discuss any questions you have with your health care provider. Document Released: 08/18/2017 Document Revised: 08/18/2017 Document Reviewed: 08/18/2017 Elsevier Interactive Patient Education  2019 [employer].

## 2018-02-26 LAB — URINALYSIS, ROUTINE W REFLEX MICROSCOPIC
Bilirubin, UA: NEGATIVE
Glucose, UA: NEGATIVE
Ketones, UA: NEGATIVE
Leukocytes, UA: NEGATIVE
Nitrite, UA: NEGATIVE
PROTEIN UA: NEGATIVE
RBC, UA: NEGATIVE
Specific Gravity, UA: 1.02 (ref 1.005–1.030)
Urobilinogen, Ur: 0.2 mg/dL (ref 0.2–1.0)
pH, UA: 7 (ref 5.0–7.5)

## 2018-02-27 ENCOUNTER — Encounter: Payer: Self-pay | Admitting: Internal Medicine

## 2018-02-27 NOTE — Assessment & Plan Note (Addendum)
Presents with complaints of urinary incontinence and abdominal fullness. She was previously evaluated by ob/gyn in 12/2017 with referral for urogynecology made. She was came to the clinic last week with same complaint. Discussed that this is a surgical issue and medications will not be able to fix this. She specifically asks for IV toradol for her discomfort, stating that this is the only medication that has helped her in the past. Physical exam confirms the prolapsing mass and reassured her that IV toradol will not be effective in treating her incontinence and discomfort as this needs to be surgically managed.  - F/u urogyn referral - [facility]

## 2018-02-27 NOTE — Progress Notes (Signed)
Internal Medicine Clinic Attending  Case discussed with Dr. Lee at the time of the visit.  We reviewed the resident's history and exam and pertinent patient test results.  I agree with the assessment, diagnosis, and plan of care documented in the resident's note.  Alexander Raines, M.D., Ph.D.  

## 2018-03-02 ENCOUNTER — Encounter: Payer: Self-pay | Admitting: Obstetrics & Gynecology

## 2018-03-02 ENCOUNTER — Ambulatory Visit (INDEPENDENT_AMBULATORY_CARE_PROVIDER_SITE_OTHER): Payer: Medicare HMO | Admitting: Obstetrics & Gynecology

## 2018-03-02 ENCOUNTER — Other Ambulatory Visit (HOSPITAL_COMMUNITY)
Admission: RE | Admit: 2018-03-02 | Discharge: 2018-03-02 | Disposition: A | Discharge status: Home / Self Care | Payer: Medicare HMO | Source: Ambulatory Visit | Attending: Obstetrics & Gynecology | Admitting: Obstetrics & Gynecology

## 2018-03-02 VITALS — BP 132/94 | HR 93 | Wt 184.1 lb

## 2018-03-02 DIAGNOSIS — Z1239 Encounter for other screening for malignant neoplasm of breast: Secondary | ICD-10-CM

## 2018-03-02 DIAGNOSIS — Z124 Encounter for screening for malignant neoplasm of cervix: Secondary | ICD-10-CM | POA: Diagnosis present

## 2018-03-02 DIAGNOSIS — T859XXA Unspecified complication of internal prosthetic device, implant and graft, initial encounter: Secondary | ICD-10-CM

## 2018-03-02 DIAGNOSIS — R102 Pelvic and perineal pain: Secondary | ICD-10-CM | POA: Diagnosis not present

## 2018-03-02 MED ORDER — OXYCODONE-ACETAMINOPHEN 5-325 MG PO TABS: 1.0000 | ORAL_TABLET | Freq: Four times a day (QID) | ORAL | 0 refills | Status: DC | PRN

## 2018-03-02 NOTE — Progress Notes (Signed)
Subjective:     Olivia Ewing is a 68 y.o. female here for a routine exam.[id number] Pt presents with complaints of pelvic pain, prolapse and incontinence. Pt reports that she had a mesh placed for prolapse in 2008. Pt reports several years of pain that is now limiting her ability to walk without pain. She c/o spasms in her vagina and prolapse. She was referred from her primary care provider who attempted to perform a PAP with no success due to severe pain. Pt reports tha that she cannot lie comfortably at night due to pelvic pain. She reports that over the last 3 weeks the pain has become even more severe and she is taking pain meds every 6 hours.  Pt and daughter feel like she has been "getting the run around and referred from doctor to doctor."  She reports that she has not seen GYN or UroGYN for this issue in years.   Gynecologic History No LMP recorded. Patient is postmenopausal. Contraception: post menopausal status Last Pap: 11/2008. Results were: normal Last mammogram: 01/11/2011. Results were: normal  Obstetric History OB History  Gravida Para Term Preterm AB Living  7 4 4  0 3 4  SAB TAB Ectopic Multiple Live Births  3 0 0 0      # Outcome Date GA Lbr Len/2nd Weight Sex Delivery Anes PTL Lv  7 SAB           6 SAB           5 SAB           4 Term           3 Term           2 Term           1 Term            The following portions of the patient's history were reviewed and updated as appropriate: allergies, current medications, past family history, past medical history, past social history, past surgical history and problem list.  Review of Systems Pertinent items are noted in HPI.    Objective:  BP (!) 132/94   Pulse 93   Wt 184 lb 1.6 oz (83.5 kg)   BMI 30.64 kg/m   CONSTITUTIONAL: Well-developed, well-nourished female in no acute distress. Very pleaset pt with good sense of humor who cried during the pelvic exam   HENT:  Normocephalic, atraumatic EYES: Conjunctivae  and EOM are normal. No scleral icterus.  NECK: Normal range of motion SKIN: Skin is warm and dry. No rash noted. Not diaphoretic.No pallor. [place]: Alert and oriented to person, place, and time. Normal coordination.  GYN:  This exam was limited by pain on exam. Pt was only able to tolerate a minimal exam.  EBUS: no lesions noted.  Vagina: no blood in vault; there is a cystocele and I could palpate the mesh  however exam was limited due to pain and I am not able to determine the extent of the prolapse.. It does not appear that the uterus is prolapsed but, the exam was aborted due to pts discomfort.  Mesh is NOT visible. Cervix: no lesion; no mucopurulent d/c Uterus/Adnexa: exam limited by pts body habitus and pain.  Rectum: no masses noted. There is a rectocele   Assessment:  Cervical cancer screen Breast cancer screen Pelvic organ prolapse  Issues related to mesh     Plan:  PAP with hrHPV  Mammogram ordered.    Pelvic pain   Pelvic  US- Abd and TV (if possible)  Refilled Percocet #25. Pt and daughter infomred that this is only until she can get a formal eval.     Referral to Dr. Maryland Pink for eval and management of the mesh, prolapse and incontinence  Total face-to-face time with patient was 40 min.  Greater than 50% was spent in counseling and coordination of care with the patient.   Brayam Boeke L. Harraway-Smith, M.D., Cherlynn June

## 2018-03-03 ENCOUNTER — Other Ambulatory Visit: Payer: Self-pay | Admitting: Obstetrics & Gynecology

## 2018-03-03 ENCOUNTER — Telehealth: Payer: Self-pay

## 2018-03-03 DIAGNOSIS — N393 Stress incontinence (female) (male): Secondary | ICD-10-CM

## 2018-03-03 DIAGNOSIS — N8110 Cystocele, unspecified: Secondary | ICD-10-CM

## 2018-03-03 DIAGNOSIS — Z1231 Encounter for screening mammogram for malignant neoplasm of breast: Secondary | ICD-10-CM

## 2018-03-03 DIAGNOSIS — N816 Rectocele: Secondary | ICD-10-CM

## 2018-03-03 NOTE — Telephone Encounter (Signed)
Mammogram scheduled for 03/06/18 @ 2pm. Dr. Maryland Pink appt scheduled for March 25 @ 1030.   [name] scheduled for 03/09/18 @ 0800.  Notified pt of all appts.  Pt stated thank you with no further questions.

## 2018-03-05 ENCOUNTER — Telehealth: Payer: Self-pay | Admitting: Internal Medicine

## 2018-03-05 ENCOUNTER — Other Ambulatory Visit: Payer: Self-pay | Admitting: Sports Medicine

## 2018-03-06 ENCOUNTER — Ambulatory Visit: Payer: Medicare HMO

## 2018-03-06 LAB — CYTOLOGY - PAP
HPV 16/18/45 genotyping: NEGATIVE
HPV: DETECTED — AB

## 2018-03-09 ENCOUNTER — Ambulatory Visit (HOSPITAL_COMMUNITY): Admission: RE | Admit: 2018-03-09 | Payer: Medicare HMO | Source: Ambulatory Visit

## 2018-03-11 ENCOUNTER — Telehealth: Payer: Self-pay

## 2018-03-11 ENCOUNTER — Ambulatory Visit (HOSPITAL_COMMUNITY)
Admission: RE | Admit: 2018-03-11 | Discharge: 2018-03-11 | Disposition: A | Discharge status: Home / Self Care | Payer: Medicare HMO | Source: Ambulatory Visit | Attending: Obstetrics & Gynecology | Admitting: Obstetrics & Gynecology

## 2018-03-11 DIAGNOSIS — R102 Pelvic and perineal pain: Secondary | ICD-10-CM | POA: Insufficient documentation

## 2018-03-11 NOTE — Telephone Encounter (Addendum)
-----   Message from Lavonia Drafts, MD sent at 03/11/2018  2:43 PM EST ----- please call pt. She needs a colpo.   Thx, clh-s  Informed pt of results and explained what a colposcopy is.  I informed pt that someone from the front office will call her with an appt.  Pt stated understanding.  Message sent to the front office.

## 2018-03-12 ENCOUNTER — Telehealth: Payer: Self-pay | Admitting: Emergency Medicine

## 2018-03-12 NOTE — Telephone Encounter (Signed)
Discussed pt complaints with Dr. Ihor Dow. Per Dr. Ihor Dow there will be no refills on the pain medication. Recommends pt be seen in the emergency department for severe pain and to either keep appointment for 2/20 or reschedule for an earlier appointment if she desires. Called pt, no answer, and LVM informing pt that I was returning her phone call and advised pt to be seen in the emergency department if pain is severe. Pt was also advised to call the clinic if she had any further questions or concerns.

## 2018-03-12 NOTE — Telephone Encounter (Signed)
Pt called and left a voicemail on the nurse voicemail line stating she needed her pain medication refilled and sent to [facility] pharmacy on [address]. Pt stated she is still having real bad bladder spasms.   Per chart review, pt was prescribed oxycodone 5-325 (25 tablets) on 1/20 by Dr. Ihor Dow.

## 2018-03-17 ENCOUNTER — Ambulatory Visit (INDEPENDENT_AMBULATORY_CARE_PROVIDER_SITE_OTHER): Payer: Medicare HMO | Admitting: Family Medicine

## 2018-03-17 VITALS — BP 140/91 | Ht 62.0 in | Wt 184.0 lb

## 2018-03-17 DIAGNOSIS — M25551 Pain in right hip: Secondary | ICD-10-CM

## 2018-03-17 DIAGNOSIS — M5441 Lumbago with sciatica, right side: Secondary | ICD-10-CM

## 2018-03-17 DIAGNOSIS — G8929 Other chronic pain: Secondary | ICD-10-CM

## 2018-03-17 MED ORDER — KETOROLAC TROMETHAMINE 60 MG/2ML IM SOLN
60.0000 mg | Freq: Once | INTRAMUSCULAR | Status: AC
  Administered 2018-03-17: 60 mg via INTRAMUSCULAR

## 2018-03-17 NOTE — Patient Instructions (Addendum)
Get x-rays at [facility] imaging and return after that. You were given a toradol injection. We will check for markers of infection, your blood count. I'd recommend seeing the neurosurgery group as well - you've had 3 epidurals in a 6 month period which is usually the maximum recommended.

## 2018-03-18 ENCOUNTER — Encounter: Payer: Self-pay | Admitting: Family Medicine

## 2018-03-18 NOTE — Progress Notes (Signed)
PCP: Katherine Roan, MD  Subjective:   HPI: Patient is a 69 y.o. female here for low back, right hip pain.  Patient reports history of chronic right sided low back pain that she states over past few days has become the most severe pain she has ever had. Pain is a burning mostly in right groin at over 10/10 level. Difficulty getting comfortable. Cannot lie down flat. Pulling on right side of back into groin. No changes in bowel or bladder function but she's had chronic bladder spasms. No numbness, skin changes. She's taking gabapentin 919m once a day but states it doesn't help. Recently given oxycodone 25 tablets on 1/20 for pelvic pain. previously tried flexeril as well. She had MRI of lumbar spine 05/19/17 with multilevel degenerative changes with worst at L4-5 with mod-severe spinal stenosis and mod-severe foraminal stenosis bilaterally but stable compared to previous imaging. She had ESIs on 5/2, 7/15, 9/19, and 11/20 and has seen Dr. CChristella Noa  Past Medical History:  Diagnosis Date  . Anemia   . Arthritis    knees hands  . Bipolar disorder ([place]   . Cervicalgia   . Depression   . Environmental allergies    cause SOB, uses inhaler for  . Environmental and seasonal allergies    uses inhaler prn  . Full dentures   . GERD (gastroesophageal reflux disease)    diet controlled - no meds  . Hyperlipidemia    diet controlled, no meds  . Hypertension   . Lumbar radiculopathy, chronic   . Right knee pain    posterior horn medial meniscal tear MRI 2013  . Spinal stenosis    getting epidural injections -last one 06/12/2015  . SVD (spontaneous vaginal delivery)    x 4    Current Outpatient Medications on File Prior to Visit  Medication Sig Dispense Refill  . albuterol (PROVENTIL HFA;VENTOLIN HFA) 108 (90 Base) MCG/ACT inhaler Inhale 1-2 puffs into the lungs every 6 (six) hours as needed for wheezing or shortness of breath. 1 Inhaler 11  . amLODipine (NORVASC) 5 MG tablet Take  1 tablet (5 mg total) by mouth daily. 30 tablet 3  . cyclobenzaprine (FLEXERIL) 5 MG tablet TAKE 1 TABLET BY MOUTH THREE TIMES A DAY AS NEEDED FOR MUSCLE SPASMS 30 tablet 0  . ferrous sulfate 325 (65 FE) MG tablet Take 1 tablet (325 mg total) by mouth daily with breakfast. 90 tablet 0  . gabapentin (NEURONTIN) 300 MG capsule Take 2 tabs three times a day (Patient taking differently: Take 300 mg by mouth 3 (three) times daily. Take 2 tabs three times a day) 180 capsule 3  . lisinopril-hydrochlorothiazide (PRINZIDE,ZESTORETIC) 20-12.5 MG tablet TAKE 1 TABLET BY MOUTH DAILY 90 tablet 2  . Multiple Vitamin (MULTIVITAMIN WITH MINERALS) TABS tablet Take 1 tablet by mouth daily.    .Marland KitchenoxyCODONE-acetaminophen (PERCOCET/ROXICET) 5-325 MG tablet Take 1-2 tablets by mouth every 6 (six) hours as needed. 25 tablet 0  . phenazopyridine (PYRIDIUM) 200 MG tablet Take 1 tablet (200 mg total) by mouth 3 (three) times daily as needed for pain. 20 tablet 1  . QUEtiapine (SEROQUEL) 50 MG tablet Take 50 mg by mouth at bedtime.    . vitamin E 400 UNIT capsule Take 400 Units by mouth daily.    .Marland KitchenFLUoxetine (PROZAC) 40 MG capsule      No current facility-administered medications on file prior to visit.     Past Surgical History:  Procedure Laterality Date  . APPENDECTOMY    .  Bladder tack  10/2006   cystocoele  . BREAST SURGERY Right    benign cyst  . COLONOSCOPY    . Hysterectomy other    . TUBAL LIGATION      No Known Allergies  Social History   Socioeconomic History  . Marital status: Divorced    Spouse name: Not on file  . Number of children: 4  . Years of education: Not on file  . Highest education level: Not on file  Occupational History  . Not on file  Social Needs  . Financial resource strain: Not on file  . Food insecurity:    Worry: Never true    Inability: Never true  . Transportation needs:    Medical: Yes    Non-medical: Yes  Tobacco Use  . Smoking status: Never Smoker  . Smokeless  tobacco: Never Used  Substance and Sexual Activity  . Alcohol use: Yes    Alcohol/week: 0.0 standard drinks    Comment: socially wine  . Drug use: Yes    Types: Marijuana    Comment: last used 2019  . Sexual activity: Not Currently    Birth control/protection: Surgical  Lifestyle  . Physical activity:    Days per week: Not on file    Minutes per session: Not on file  . Stress: Not on file  Relationships  . Social connections:    Talks on phone: Not on file    Gets together: Not on file    Attends religious service: Not on file    Active member of club or organization: Not on file    Attends meetings of clubs or organizations: Not on file    Relationship status: Not on file  . Intimate partner violence:    Fear of current or ex partner: Not on file    Emotionally abused: Not on file    Physically abused: Not on file    Forced sexual activity: Not on file  Other Topics Concern  . Not on file  Social History Narrative   Single. 9 siblings. 1 brother with HIV, 1 sister with hypertension. 4 children, 1 daughter with HTN and depression, 1 son with HTN.   [phone]- shelter's number.    Former smoker, no alcohol or drug use.    Family History  Problem Relation Age of Onset  . Hypertension Mother     BP (!) 140/91   Ht _0  (1.575 m)   Wt 184 lb (83.5 kg)   BMI 33.65 kg/m   Review of Systems: See HPI above.     Objective:  Physical Exam:  Gen: tearful throughout exam but guarding, would not let provider fully examine states due to pain.  Back:   No gross deformity, scoliosis. TTP right paraspinal lumbar region less than lateral and about right hip. Markedly limited motion - leaning to left. Unable to assess strength, MSRs. Negative SLRs. Sensation intact to light touch bilaterally.  Right hip: No deformity. Guarding with apparent severe pain on passive IR and ER, limited motion. Diffuse tenderness. Could not assess her strength. NVI distally.   Assessment  & Plan:  1. Low back, right hip pain - patient reports severe pain 'worst she's ever had' but unable to fully examine.  Localizes pain more to hip with a burning in groin - stressed importance of getting radiographs of right hip given severe pain with even mild passive motion and CBC with diff, CRP, ESR but she states she will come back Friday for this despite  risks of infection, early AVN.  She verbalized understanding.  Given IM toradol injection.  Recommended seeing neurosurgery group for reevaluation as well.

## 2018-03-20 ENCOUNTER — Ambulatory Visit
Admission: RE | Admit: 2018-03-20 | Discharge: 2018-03-20 | Disposition: A | Discharge status: Home / Self Care | Payer: 59 | Source: Ambulatory Visit | Attending: Family Medicine | Admitting: Family Medicine

## 2018-03-20 DIAGNOSIS — M25551 Pain in right hip: Secondary | ICD-10-CM

## 2018-03-20 NOTE — Addendum Note (Signed)
Addended by: Francene Castle on: 03/20/2018 09:48 AM   Modules accepted: Orders

## 2018-03-21 LAB — CBC WITH DIFFERENTIAL/PLATELET
Basophils Absolute: 0 10*3/uL (ref 0.0–0.2)
Basos: 1 %
EOS (ABSOLUTE): 0.3 10*3/uL (ref 0.0–0.4)
Eos: 5 %
Hematocrit: 34.5 % (ref 34.0–46.6)
Hemoglobin: 10.8 g/dL — ABNORMAL LOW (ref 11.1–15.9)
IMMATURE GRANS (ABS): 0 10*3/uL (ref 0.0–0.1)
Immature Granulocytes: 0 %
Lymphocytes Absolute: 1.8 10*3/uL (ref 0.7–3.1)
Lymphs: 28 %
MCH: 26.2 pg — AB (ref 26.6–33.0)
MCHC: 31.3 g/dL — ABNORMAL LOW (ref 31.5–35.7)
MCV: 84 fL (ref 79–97)
Monocytes Absolute: 0.4 10*3/uL (ref 0.1–0.9)
Monocytes: 7 %
Neutrophils Absolute: 3.7 10*3/uL (ref 1.4–7.0)
Neutrophils: 59 %
Platelets: 361 10*3/uL (ref 150–450)
RBC: 4.13 x10E6/uL (ref 3.77–5.28)
RDW: 13.8 % (ref 11.7–15.4)
WBC: 6.3 10*3/uL (ref 3.4–10.8)

## 2018-03-21 LAB — SEDIMENTATION RATE: Sed Rate: 92 mm/hr — ABNORMAL HIGH (ref 0–40)

## 2018-03-21 LAB — C-REACTIVE PROTEIN: CRP: 7 mg/L (ref 0–10)

## 2018-03-24 ENCOUNTER — Other Ambulatory Visit: Payer: Self-pay | Admitting: Internal Medicine

## 2018-03-24 ENCOUNTER — Telehealth: Payer: Self-pay | Admitting: General Practice

## 2018-03-24 ENCOUNTER — Telehealth: Payer: Self-pay | Admitting: *Deleted

## 2018-03-24 DIAGNOSIS — M1610 Unilateral primary osteoarthritis, unspecified hip: Secondary | ICD-10-CM

## 2018-03-24 DIAGNOSIS — M5416 Radiculopathy, lumbar region: Secondary | ICD-10-CM

## 2018-03-24 DIAGNOSIS — R102 Pelvic and perineal pain: Secondary | ICD-10-CM

## 2018-03-24 MED ORDER — DICLOFENAC SODIUM 75 MG PO TBEC: DELAYED_RELEASE_TABLET | ORAL | 0 refills | Status: DC

## 2018-03-24 NOTE — Telephone Encounter (Signed)
Informed her that Dr Barbaraann Barthel said she does have an elevated sed rate but normal CRP and WBC count. She stated she had no documented any fevers so I asked her to take her temp while I held. Her temp per her was 99 orally. Also told her that the labs showed inflammation and her x-rays of hip show severe arthritis of the hip as well. I told her we would set her up to have ESI's at [place] [facility] and gave her Roberta's number at [phone]. Told her at somepoint she would also need to follow up with her neurosurgeon if the injections are not providing lasting relief. Informed her she will need to  see ortho about her hip given the severity. Placed an order in for [facility] to call her and set up and appointment . I offer to call in diclofenac as well 75mg  twice a day with food in addition to topical aspercreme, salon pas patches, tylenol for pain relief. She accepted and wanted it called into [facility].

## 2018-03-24 NOTE — Telephone Encounter (Signed)
Per Epic, pt has an appt on 3/4 with urogyn.

## 2018-03-24 NOTE — Telephone Encounter (Signed)
Patient called and left message on nurse voicemail line stating she [facility] sp[facility]. Patient states her appt with urology isn't until next month and the pain is unbearable.  Discussed with Dr Ihor Dow who states refill will not be provided on narcotic- patient can return to PCP in the meantime. Called patient & discussed with her. Patient verbalized understanding & asked when all her appts were. Provided appt info for mammogram, [name], & Dr Maryland Pink. Patient verbalized understanding to all & had no questions.

## 2018-03-24 NOTE — Telephone Encounter (Signed)
Needs refill on   oxyCODONE-acetaminophen (PERCOCET/ROXICET) 5-325 MG tablet   [id number] [place], [place]  ; pt contact [phone]

## 2018-03-25 ENCOUNTER — Other Ambulatory Visit: Payer: Self-pay | Admitting: Family Medicine

## 2018-03-25 DIAGNOSIS — M5416 Radiculopathy, lumbar region: Secondary | ICD-10-CM

## 2018-03-25 NOTE — Addendum Note (Signed)
Addended by: Cyd Silence on: 03/25/2018 08:50 AM   Modules accepted: Orders

## 2018-03-26 NOTE — Telephone Encounter (Signed)
I am not the prescriber, didn't evaluate pt for this issue, appears to have been meant for a short term supply.

## 2018-03-26 NOTE — Telephone Encounter (Signed)
Called pt - no answer; left message to call the office . 

## 2018-03-31 ENCOUNTER — Ambulatory Visit (INDEPENDENT_AMBULATORY_CARE_PROVIDER_SITE_OTHER): Payer: Medicare HMO | Admitting: Orthopaedic Surgery

## 2018-03-31 ENCOUNTER — Encounter (INDEPENDENT_AMBULATORY_CARE_PROVIDER_SITE_OTHER): Payer: Self-pay | Admitting: Orthopaedic Surgery

## 2018-03-31 DIAGNOSIS — M87051 Idiopathic aseptic necrosis of right femur: Secondary | ICD-10-CM

## 2018-03-31 MED ORDER — HYDROCODONE-ACETAMINOPHEN 5-325 MG PO TABS: 1.0000 | ORAL_TABLET | Freq: Every day | ORAL | 0 refills | Status: DC | PRN

## 2018-03-31 NOTE — Progress Notes (Signed)
Office Visit Note   Patient: Olivia Ewing           Date of Birth: [birth year]           MRN: [id number] Visit Date: 03/31/2018              Requested by: Dene Gentry, MD [address] [place], [place] [address] PCP: Katherine Roan, MD   Assessment & Plan: Visit Diagnoses:  1. 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  X-rays were reviewed with the patient today which demonstrates severe degenerative joint disease.  We discussed surgical versus nonsurgical treatments in their likelihood to provide meaningful pain relief.  She understands at a total hip replacement is the only likely option to provide her with good pain relief.  I did give her a small supply of hydrocodone which I stated would not be refilled prior to surgery.  Patient will call us when she is ready to proceed with surgery.  Follow-Up Instructions: Return if symptoms worsen or fail to improve.   Orders:  No orders of the defined types were placed in this encounter.  Meds ordered this encounter  Medications  . HYDROcodone-acetaminophen (NORCO) 5-325 MG tablet    Sig: Take 1-2 tablets by mouth daily as needed.    Dispense:  30 tablet    Refill:  0      Procedures: No procedures performed   Clinical Data: No additional findings.   Subjective: Chief Complaint  Patient presents with  . Right Hip - Pain    [name] is a very pleasant 68 year old female who comes in with chronic right hip pain that she reports is 9 out of 10 that is constant in her right hip and groin region.  She does have spinal stenosis but this pain is different from her usual back pain.  She ambulates with a cane as a result of the right hip pain.  She is not had any cortisone injections.  She denies any risk factors for AVN.  She takes Tylenol and diclofenac has not helped to any significant  extent.   Review of Systems  Constitutional: Negative.   HENT: Negative.   Eyes: Negative.   Respiratory: Negative.   Cardiovascular: Negative.   Endocrine: Negative.   Musculoskeletal: Negative.   Neurological: Negative.   Hematological: Negative.   Psychiatric/Behavioral: Negative.   All other systems reviewed and are negative.    Objective: Vital Signs: There were no vitals taken for this visit.  Physical Exam Vitals signs and nursing note reviewed.  Constitutional:      Appearance: She is well-developed.  HENT:     Head: Normocephalic and atraumatic.  Neck:     Musculoskeletal: Neck supple.  Pulmonary:     Effort: Pulmonary effort is normal.  Abdominal:     Palpations: Abdomen is soft.  Skin:    General: Skin is warm.     Capillary Refill: Capillary refill takes less than 2 seconds.  Neurological:     Mental Status: She is alert and oriented to person, place, and time.  Psychiatric:        Behavior: Behavior normal.        Thought Content: Thought content normal.        Judgment: Judgment normal.     Ortho Exam Right hip exam shows minimal range of motion with positive logroll  and FADIR.  Trochanteric bursa is nontender. Specialty Comments:  No specialty comments available.  Imaging: No results found.   PMFS History: Patient Active Problem List   Diagnosis Date Noted  . Shortness of breath 01/02/2018  . Chalazion left upper eyelid 02/20/2017  . Vitamin D insufficiency 11/25/2013  . Healthcare maintenance 11/25/2013  . Spinal stenosis of lumbar region 07/02/2013  . Arthritis, degenerative 11/17/2012  . Baker's cyst of knee 06/05/2012  . Rectocele 04/28/2012  . Left knee DJD, degenerative meniscus tear 04/06/2012  . Numbness and tingling in hands 03/17/2012  . Periodontitis 09/23/2011  . Depression 09/23/2011  . Right knee meniscal tear 03/14/2011  . Lumbar radiculopathy 03/14/2011  . Stress incontinence of urine 10/18/2010  . NECK PAIN,  CHRONIC 08/19/2008  . Normocytic anemia 05/12/2008  . SHOULDER PAIN, RIGHT 04/28/2008  . Hyperlipemia 06/18/2007  . Essential hypertension 05/20/2007   Past Medical History:  Diagnosis Date  . Anemia   . Arthritis    knees hands  . Bipolar disorder ([place])   . Cervicalgia   . Depression   . Environmental allergies    cause SOB, uses inhaler for  . Environmental and seasonal allergies    uses inhaler prn  . Full dentures   . GERD (gastroesophageal reflux disease)    diet controlled - no meds  . Hyperlipidemia    diet controlled, no meds  . Hypertension   . Lumbar radiculopathy, chronic   . Right knee pain    posterior horn medial meniscal tear MRI 2013  . Spinal stenosis    getting epidural injections -last one 06/12/2015  . SVD (spontaneous vaginal delivery)    x 4    Family History  Problem Relation Age of Onset  . Hypertension Mother     Past Surgical History:  Procedure Laterality Date  . APPENDECTOMY    . Bladder tack  10/2006   cystocoele  . BREAST SURGERY Right    benign cyst  . COLONOSCOPY    . Hysterectomy other    . TUBAL LIGATION     Social History   Occupational History  . Not on file  Tobacco Use  . Smoking status: Never Smoker  . Smokeless tobacco: Never Used  Substance and Sexual Activity  . Alcohol use: Yes    Alcohol/week: 0.0 standard drinks    Comment: socially wine  . Drug use: Yes    Types: Marijuana    Comment: last used 2019  . Sexual activity: Not Currently    Birth control/protection: Surgical

## 2018-04-01 ENCOUNTER — Ambulatory Visit
Admission: RE | Admit: 2018-04-01 | Discharge: 2018-04-01 | Disposition: A | Discharge status: Home / Self Care | Payer: 59 | Source: Ambulatory Visit | Attending: Family Medicine | Admitting: Family Medicine

## 2018-04-01 ENCOUNTER — Telehealth (INDEPENDENT_AMBULATORY_CARE_PROVIDER_SITE_OTHER): Payer: Self-pay | Admitting: Orthopaedic Surgery

## 2018-04-01 ENCOUNTER — Ambulatory Visit: Payer: Medicare HMO

## 2018-04-01 DIAGNOSIS — M5416 Radiculopathy, lumbar region: Secondary | ICD-10-CM

## 2018-04-01 MED ORDER — IOPAMIDOL (ISOVUE-M 200) INJECTION 41%
1.0000 mL | Freq: Once | INTRAMUSCULAR | Status: AC
  Administered 2018-04-01: 1 mL via EPIDURAL

## 2018-04-01 MED ORDER — METHYLPREDNISOLONE ACETATE 40 MG/ML INJ SUSP (RADIOLOG
120.0000 mg | Freq: Once | INTRAMUSCULAR | Status: AC
  Administered 2018-04-01: 120 mg via EPIDURAL

## 2018-04-01 NOTE — Telephone Encounter (Signed)
See message below. Patient was just seen yesterday.

## 2018-04-01 NOTE — Telephone Encounter (Signed)
Pt called in said she is ready to proceed with her hip replacement surgery and would like to get worked in asap but I did advise to the patient that we'd have to get it approved before proceeding.  [phone]

## 2018-04-01 NOTE — Telephone Encounter (Signed)
Please schedule. thanks

## 2018-04-01 NOTE — Discharge Instructions (Signed)

## 2018-04-02 ENCOUNTER — Ambulatory Visit: Payer: Medicare HMO | Admitting: Obstetrics & Gynecology

## 2018-04-02 ENCOUNTER — Telehealth: Payer: Self-pay | Admitting: General Practice

## 2018-04-02 NOTE — Telephone Encounter (Signed)
Patient no showed for appt today. Called patient, no answer- left message stating we are trying to reach you as you missed your appt today in our office. Please call our front office to reschedule this appt.

## 2018-04-03 NOTE — Telephone Encounter (Signed)
I called and left voice mail for return call to schedule. 

## 2018-04-07 NOTE — Telephone Encounter (Signed)
Spoke with patient and scheduled surgery. °

## 2018-04-10 ENCOUNTER — Ambulatory Visit: Payer: 59

## 2018-04-13 ENCOUNTER — Other Ambulatory Visit: Payer: Self-pay | Admitting: Sports Medicine

## 2018-04-16 NOTE — Pre-Procedure Instructions (Signed)
Olivia Ewing  04/16/2018      [place] [address] Phone: [phone] Fax: [place], [place] - [address] [facility]. [address] [place] [address] Phone: [phone] Fax: [place], [place] [address] [place] [address] Phone: [phone] Fax: [phone]    Your procedure is scheduled on March 16th.  Report to [facility] Entrance "A" Admitting at 8:25 A.M.  Call this number if you have problems the morning of surgery:  [phone]   Remember:  Do not eat or drink after midnight.     Take these medicines the morning of surgery with A SIP OF WATER  Albuterol Inhaler - if needed (bring with you)  Amlodipine (Norvasc)  Gabapentin (Neurontin)  Hydrocodone-Acetaminophen - if needed  Phenergan - if needed  Fluoxetine (Prozac)    7 days prior to surgery STOP taking any Aspirin (unless otherwise instructed by your surgeon), Aleve, Naproxen, Ibuprofen, Motrin, Advil, Goody's, BC's, all herbal medications, fish oil, and all vitamins.   Do not wear jewelry, make-up or nail polish.  Do not wear lotions, powders, or perfumes, or deodorant.  Do not shave 48 hours prior to surgery.   Do not bring valuables to the hospital.  [facility] is not responsible for any belongings or valuables.   [facility]- Preparing For Surgery  Before surgery, you can play an important role. Because skin is not sterile, your skin needs to be as free of germs as possible. You can reduce the number of germs on your skin by washing with CHG (chlorahexidine gluconate) Soap before surgery.  CHG is an antiseptic cleaner which kills germs and bonds with the skin to continue killing germs even after washing.    Oral Hygiene is also important to reduce your risk of infection.  Remember - BRUSH YOUR  TEETH THE MORNING OF SURGERY WITH YOUR REGULAR TOOTHPASTE  Please do not use if you have an allergy to CHG or antibacterial soaps. If your skin becomes reddened/irritated stop using the CHG.  Do not shave (including legs and underarms) for at least 48 hours prior to first CHG shower. It is OK to shave your face.  Please follow these instructions carefully.   1. Shower the NIGHT BEFORE SURGERY and the MORNING OF SURGERY with CHG.   2. If you chose to wash your hair, wash your hair first as usual with your normal shampoo.  3. After you shampoo, rinse your hair and body thoroughly to remove the shampoo.  4. Use CHG as you would any other liquid soap. You can apply CHG directly to the skin and wash gently with a scrungie or a clean washcloth.   5. Apply the CHG Soap to your body ONLY FROM THE NECK DOWN.  Do not use on open wounds or open sores. Avoid contact with your eyes, ears, mouth and genitals (private parts). Wash Face and genitals (private parts)  with your normal soap.  6. Wash thoroughly, paying special attention to the area where your surgery will be performed.  7. Thoroughly rinse your body with warm water from the neck down.  8. DO NOT shower/wash with your normal soap after using and rinsing off the CHG Soap.  9. Pat yourself dry with a CLEAN TOWEL.  10. Wear CLEAN PAJAMAS to bed the night before surgery, wear comfortable clothes the morning of  surgery  11. Place CLEAN SHEETS on your bed the night of your first shower and DO NOT SLEEP WITH PETS.   Day of Surgery:  Do not apply any deodorants/lotions.  Please wear clean clothes to the hospital/surgery center.   Remember to brush your teeth WITH YOUR REGULAR TOOTHPASTE.   Contacts, dentures or bridgework may not be worn into surgery.  Leave your suitcase in the car.  After surgery it may be brought to your room.  For patients admitted to the hospital, discharge time will be determined by your treatment team.  Patients  discharged the day of surgery will not be allowed to drive home.   Please read over the following fact sheets that you were given. Coughing and Deep Breathing and Surgical Site Infection Prevention

## 2018-04-17 ENCOUNTER — Inpatient Hospital Stay (HOSPITAL_COMMUNITY)
Admission: RE | Admit: 2018-04-17 | Discharge: 2018-04-17 | Disposition: A | Discharge status: Home / Self Care | Payer: Medicaid Other | Source: Ambulatory Visit

## 2018-04-20 ENCOUNTER — Other Ambulatory Visit: Payer: Self-pay

## 2018-04-20 ENCOUNTER — Encounter (HOSPITAL_COMMUNITY): Payer: Self-pay

## 2018-04-20 ENCOUNTER — Ambulatory Visit (HOSPITAL_COMMUNITY)
Admission: RE | Admit: 2018-04-20 | Discharge: 2018-04-20 | Disposition: A | Discharge status: Home / Self Care | Payer: Medicare HMO | Source: Ambulatory Visit | Attending: Physician Assistant | Admitting: Physician Assistant

## 2018-04-20 ENCOUNTER — Encounter (HOSPITAL_COMMUNITY)
Admission: RE | Admit: 2018-04-20 | Discharge: 2018-04-20 | Disposition: A | Discharge status: Home / Self Care | Payer: Medicare HMO | Source: Ambulatory Visit | Attending: Orthopaedic Surgery | Admitting: Orthopaedic Surgery

## 2018-04-20 DIAGNOSIS — M1611 Unilateral primary osteoarthritis, right hip: Secondary | ICD-10-CM | POA: Insufficient documentation

## 2018-04-20 DIAGNOSIS — I517 Cardiomegaly: Secondary | ICD-10-CM | POA: Insufficient documentation

## 2018-04-20 LAB — CBC WITH DIFFERENTIAL/PLATELET
Abs Immature Granulocytes: 0.01 10*3/uL (ref 0.00–0.07)
BASOS ABS: 0.1 10*3/uL (ref 0.0–0.1)
Basophils Relative: 1 %
Eosinophils Absolute: 0.2 10*3/uL (ref 0.0–0.5)
Eosinophils Relative: 4 %
HCT: 36.5 % (ref 36.0–46.0)
HEMOGLOBIN: 11.1 g/dL — AB (ref 12.0–15.0)
Immature Granulocytes: 0 %
Lymphocytes Relative: 29 %
Lymphs Abs: 1.7 10*3/uL (ref 0.7–4.0)
MCH: 26.3 pg (ref 26.0–34.0)
MCHC: 30.4 g/dL (ref 30.0–36.0)
MCV: 86.5 fL (ref 80.0–100.0)
Monocytes Absolute: 0.5 10*3/uL (ref 0.1–1.0)
Monocytes Relative: 8 %
NEUTROS ABS: 3.5 10*3/uL (ref 1.7–7.7)
Neutrophils Relative %: 58 %
Platelets: 324 10*3/uL (ref 150–400)
RBC: 4.22 MIL/uL (ref 3.87–5.11)
RDW: 14.4 % (ref 11.5–15.5)
WBC: 6 10*3/uL (ref 4.0–10.5)
nRBC: 0 % (ref 0.0–0.2)

## 2018-04-20 LAB — COMPREHENSIVE METABOLIC PANEL
ALBUMIN: 3.7 g/dL (ref 3.5–5.0)
ALT: 21 U/L (ref 0–44)
AST: 23 U/L (ref 15–41)
Alkaline Phosphatase: 76 U/L (ref 38–126)
Anion gap: 11 (ref 5–15)
BUN: 8 mg/dL (ref 8–23)
CO2: 25 mmol/L (ref 22–32)
CREATININE: 0.78 mg/dL (ref 0.44–1.00)
Calcium: 9.5 mg/dL (ref 8.9–10.3)
Chloride: 102 mmol/L (ref 98–111)
GFR calc Af Amer: >60 mL/min (ref >60)
GFR calc non Af Amer: >60 mL/min (ref >60)
Glucose, Bld: 67 mg/dL — ABNORMAL LOW (ref 70–99)
Potassium: 3.2 mmol/L — ABNORMAL LOW (ref 3.5–5.1)
Sodium: 138 mmol/L (ref 135–145)
Total Bilirubin: 0.5 mg/dL (ref 0.3–1.2)
Total Protein: 7.6 g/dL (ref 6.5–8.1)

## 2018-04-20 LAB — SURGICAL PCR SCREEN
MRSA, PCR: NEGATIVE
Staphylococcus aureus: POSITIVE — AB

## 2018-04-20 LAB — ABO/RH: ABO/RH(D): A NEG

## 2018-04-20 LAB — PROTIME-INR
INR: 0.9 (ref 0.8–1.2)
Prothrombin Time: 12.1 seconds (ref 11.4–15.2)

## 2018-04-20 LAB — TYPE AND SCREEN
ABO/RH(D): A NEG
Antibody Screen: NEGATIVE

## 2018-04-20 LAB — APTT: aPTT: 27 seconds (ref 24–36)

## 2018-04-20 NOTE — Pre-Procedure Instructions (Signed)
TAKASHA VETERE  04/20/2018      [place] [address] Phone: [phone] Fax: [place], [place] - [address] [facility]. [address] [place] [address] Phone: [phone] Fax: [place], [place] [address] [place] [address] Phone: [phone] Fax: [phone]    Your procedure is scheduled on March 16  Report to [facility] at [place].M.  Call this number if you have problems the morning of surgery:  [phone]   Remember:  Do not eat or drink after midnight.    Take these medicines the morning of surgery with A SIP OF WATER  albuterol (PROVENTIL HFA;VENTOLIN HFA) amLODipine (NORVASC) cyclobenzaprine (FLEXERIL)  FLUoxetine (PROZAC) gabapentin (NEURONTIN) hydrOXYzine (ATARAX/VISTARIL)   7 days prior to surgery STOP taking any Aspirin (unless otherwise instructed by your surgeon), Aleve, Naproxen, Ibuprofen, Motrin, Advil, Goody's, BC's, all herbal medications, fish oil, and all vitamins.     Do not wear jewelry, make-up or nail polish.  Do not wear lotions, powders, or perfumes, or deodorant.  Do not shave 48 hours prior to surgery.   Do not bring valuables to the hospital.  [facility] is not responsible for any belongings or valuables.  Contacts, dentures or bridgework may not be worn into surgery.  Leave your suitcase in the car.  After surgery it may be brought to your room.  For patients admitted to the hospital, discharge time will be determined by your treatment team.  Patients discharged the day of surgery will not be allowed to drive home.   Special instructions:   [facility]- Preparing For Surgery  Before surgery, you can play an important role. Because skin is not sterile, your skin needs to be as free of germs as possible.  You can reduce the number of germs on your skin by washing with CHG (chlorahexidine gluconate) Soap before surgery.  CHG is an antiseptic cleaner which kills germs and bonds with the skin to continue killing germs even after washing.    Oral Hygiene is also important to reduce your risk of infection.  Remember - BRUSH YOUR TEETH THE MORNING OF SURGERY WITH YOUR REGULAR TOOTHPASTE  Please do not use if you have an allergy to CHG or antibacterial soaps. If your skin becomes reddened/irritated stop using the CHG.  Do not shave (including legs and underarms) for at least 48 hours prior to first CHG shower. It is OK to shave your face.  Please follow these instructions carefully.   1. Shower the NIGHT BEFORE SURGERY and the MORNING OF SURGERY with CHG.   2. If you chose to wash your hair, wash your hair first as usual with your normal shampoo.  3. After you shampoo, rinse your hair and body thoroughly to remove the shampoo.  4. Use CHG as you would any other liquid soap. You can apply CHG directly to the skin and wash gently with a scrungie or a clean washcloth.   5. Apply the CHG Soap to your body ONLY FROM THE NECK DOWN.  Do not use on open wounds or open sores. Avoid contact with your eyes, ears, mouth and genitals (private parts). Wash Face and genitals (private parts)  with your normal soap.  6. Wash thoroughly, paying special attention to the area where your surgery will be performed.  7. Thoroughly rinse your body  with warm water from the neck down.  8. DO NOT shower/wash with your normal soap after using and rinsing off the CHG Soap.  9. Pat yourself dry with a CLEAN TOWEL.  10. Wear CLEAN PAJAMAS to bed the night before surgery, wear comfortable clothes the morning of surgery  11. Place CLEAN SHEETS on your bed the night of your first shower and DO NOT SLEEP WITH PETS.    Day of Surgery:  Do not apply any deodorants/lotions.  Please wear clean clothes to the hospital/surgery  center.   Remember to brush your teeth WITH YOUR REGULAR TOOTHPASTE.    Please read over the following fact sheets that you were given.

## 2018-04-20 NOTE — Progress Notes (Signed)
PCP - Gu[place] Spanish Cardiologist - denies  Chest x-ray - 04/20/18 EKG - 04/20/18 Stress Test - denies ECHO - denies Cardiac Cath - denies  Anesthesia review: No  Patient denies shortness of breath, fever, cough and chest pain at PAT appointment   Patient verbalized understanding of instructions that were given to them at the PAT appointment. Patient was also instructed that they will need to review over the PAT instructions again at home before surgery.

## 2018-04-21 ENCOUNTER — Other Ambulatory Visit (INDEPENDENT_AMBULATORY_CARE_PROVIDER_SITE_OTHER): Payer: Self-pay

## 2018-04-21 ENCOUNTER — Telehealth (INDEPENDENT_AMBULATORY_CARE_PROVIDER_SITE_OTHER): Payer: Self-pay | Admitting: Physician Assistant

## 2018-04-21 MED ORDER — POTASSIUM CHLORIDE ER 10 MEQ PO TBCR: EXTENDED_RELEASE_TABLET | ORAL | 0 refills | Status: DC

## 2018-04-21 NOTE — Telephone Encounter (Signed)
Called into pharm  

## 2018-04-21 NOTE — Telephone Encounter (Signed)
Can you call in kdur 64meq?  Take 89meq bid x 4 days for low potassium.   Will you call patient and let her know potassium is low and that we have called in meds?

## 2018-04-21 NOTE — Telephone Encounter (Signed)
Called patient no answer. LMOM to return call

## 2018-04-22 NOTE — Telephone Encounter (Signed)
Pt called back saying she got her medication

## 2018-04-24 MED ORDER — TRANEXAMIC ACID-NACL 1000-0.7 MG/100ML-% IV SOLN
1000.0000 mg | INTRAVENOUS | Status: AC
  Administered 2018-04-27: 1000 mg via INTRAVENOUS

## 2018-04-24 MED ORDER — TRANEXAMIC ACID 1000 MG/10ML IV SOLN
2000.0000 mg | INTRAVENOUS | Status: AC
  Administered 2018-04-27: 1000 mg via TOPICAL
  Filled 2018-04-24: qty 20

## 2018-04-24 MED ORDER — CEFAZOLIN SODIUM-DEXTROSE 2-4 GM/100ML-% IV SOLN
2.0000 g | INTRAVENOUS | Status: AC
  Administered 2018-04-27: 2 g via INTRAVENOUS
  Filled 2018-04-24: qty 100

## 2018-04-27 ENCOUNTER — Other Ambulatory Visit: Payer: Self-pay

## 2018-04-27 ENCOUNTER — Ambulatory Visit (HOSPITAL_COMMUNITY): Payer: Medicare HMO | Admitting: Anesthesiology

## 2018-04-27 ENCOUNTER — Observation Stay (HOSPITAL_COMMUNITY): Payer: Medicare HMO

## 2018-04-27 ENCOUNTER — Encounter (HOSPITAL_COMMUNITY)
Admission: RE | Disposition: A | Discharge status: Home / Self Care | Payer: Self-pay | Source: Home / Self Care | Attending: Orthopaedic Surgery

## 2018-04-27 ENCOUNTER — Observation Stay (HOSPITAL_COMMUNITY)
Admission: RE | Admit: 2018-04-27 | Discharge: 2018-04-28 | Disposition: A | Discharge status: Home / Self Care | Payer: Medicare HMO | Attending: Orthopaedic Surgery | Admitting: Orthopaedic Surgery

## 2018-04-27 ENCOUNTER — Ambulatory Visit (HOSPITAL_COMMUNITY): Payer: Medicare HMO | Admitting: Physician Assistant

## 2018-04-27 ENCOUNTER — Encounter (HOSPITAL_COMMUNITY): Payer: Self-pay

## 2018-04-27 DIAGNOSIS — K219 Gastro-esophageal reflux disease without esophagitis: Secondary | ICD-10-CM | POA: Diagnosis not present

## 2018-04-27 DIAGNOSIS — D62 Acute posthemorrhagic anemia: Secondary | ICD-10-CM | POA: Diagnosis not present

## 2018-04-27 DIAGNOSIS — E785 Hyperlipidemia, unspecified: Secondary | ICD-10-CM | POA: Insufficient documentation

## 2018-04-27 DIAGNOSIS — M6281 Muscle weakness (generalized): Secondary | ICD-10-CM | POA: Insufficient documentation

## 2018-04-27 DIAGNOSIS — M87851 Other osteonecrosis, right femur: Principal | ICD-10-CM | POA: Insufficient documentation

## 2018-04-27 DIAGNOSIS — I10 Essential (primary) hypertension: Secondary | ICD-10-CM | POA: Diagnosis not present

## 2018-04-27 DIAGNOSIS — R262 Difficulty in walking, not elsewhere classified: Secondary | ICD-10-CM | POA: Diagnosis not present

## 2018-04-27 DIAGNOSIS — Z79899 Other long term (current) drug therapy: Secondary | ICD-10-CM | POA: Insufficient documentation

## 2018-04-27 DIAGNOSIS — M1611 Unilateral primary osteoarthritis, right hip: Secondary | ICD-10-CM | POA: Diagnosis not present

## 2018-04-27 DIAGNOSIS — Z791 Long term (current) use of non-steroidal anti-inflammatories (NSAID): Secondary | ICD-10-CM | POA: Insufficient documentation

## 2018-04-27 DIAGNOSIS — Z96641 Presence of right artificial hip joint: Secondary | ICD-10-CM

## 2018-04-27 DIAGNOSIS — Z8249 Family history of ischemic heart disease and other diseases of the circulatory system: Secondary | ICD-10-CM | POA: Diagnosis not present

## 2018-04-27 DIAGNOSIS — Z87891 Personal history of nicotine dependence: Secondary | ICD-10-CM | POA: Insufficient documentation

## 2018-04-27 DIAGNOSIS — F319 Bipolar disorder, unspecified: Secondary | ICD-10-CM | POA: Insufficient documentation

## 2018-04-27 DIAGNOSIS — M1990 Unspecified osteoarthritis, unspecified site: Secondary | ICD-10-CM

## 2018-04-27 DIAGNOSIS — Z96649 Presence of unspecified artificial hip joint: Secondary | ICD-10-CM

## 2018-04-27 DIAGNOSIS — R2689 Other abnormalities of gait and mobility: Secondary | ICD-10-CM | POA: Diagnosis not present

## 2018-04-27 HISTORY — PX: TOTAL HIP ARTHROPLASTY: SHX124

## 2018-04-27 HISTORY — DX: Presence of right artificial hip joint: Z96.641

## 2018-04-27 HISTORY — DX: Unilateral primary osteoarthritis, right hip: M16.11

## 2018-04-27 SURGERY — ARTHROPLASTY, HIP, TOTAL, ANTERIOR APPROACH
Anesthesia: Spinal | Site: Hip | Laterality: Right

## 2018-04-27 MED ORDER — MUPIROCIN 2 % EX OINT
1.0000 "application " | TOPICAL_OINTMENT | Freq: Two times a day (BID) | CUTANEOUS | Status: DC
  Administered 2018-04-27 – 2018-04-28 (×3): 1 via NASAL
  Filled 2018-04-27 (×2): qty 22

## 2018-04-27 MED ORDER — MENTHOL 3 MG MT LOZG: 1.0000 | LOZENGE | OROMUCOSAL | Status: DC | PRN

## 2018-04-27 MED ORDER — TRANEXAMIC ACID-NACL 1000-0.7 MG/100ML-% IV SOLN
INTRAVENOUS | Status: AC
  Filled 2018-04-27: qty 200

## 2018-04-27 MED ORDER — 0.9 % SODIUM CHLORIDE (POUR BTL) OPTIME
TOPICAL | Status: DC | PRN
  Administered 2018-04-27: 1000 mL

## 2018-04-27 MED ORDER — ASPIRIN EC 81 MG PO TBEC: 81.0000 mg | DELAYED_RELEASE_TABLET | Freq: Two times a day (BID) | ORAL | 0 refills | Status: DC

## 2018-04-27 MED ORDER — OXYCODONE HCL 5 MG PO TABS: 10.0000 mg | ORAL_TABLET | ORAL | Status: DC | PRN

## 2018-04-27 MED ORDER — HYDROCHLOROTHIAZIDE 12.5 MG PO CAPS
12.5000 mg | ORAL_CAPSULE | Freq: Every day | ORAL | Status: DC
  Administered 2018-04-28: 12.5 mg via ORAL
  Filled 2018-04-27 (×2): qty 1

## 2018-04-27 MED ORDER — KETOROLAC TROMETHAMINE 15 MG/ML IJ SOLN
30.0000 mg | Freq: Four times a day (QID) | INTRAMUSCULAR | Status: DC
  Administered 2018-04-27: 30 mg via INTRAVENOUS
  Filled 2018-04-27: qty 2

## 2018-04-27 MED ORDER — TRANEXAMIC ACID-NACL 1000-0.7 MG/100ML-% IV SOLN
1000.0000 mg | Freq: Once | INTRAVENOUS | Status: AC
  Administered 2018-04-27: 1000 mg via INTRAVENOUS

## 2018-04-27 MED ORDER — ALBUTEROL SULFATE (2.5 MG/3ML) 0.083% IN NEBU: 3.0000 mL | INHALATION_SOLUTION | Freq: Four times a day (QID) | RESPIRATORY_TRACT | Status: DC | PRN

## 2018-04-27 MED ORDER — METOCLOPRAMIDE HCL 5 MG/ML IJ SOLN
5.0000 mg | Freq: Three times a day (TID) | INTRAMUSCULAR | Status: DC | PRN
  Administered 2018-04-27: 10 mg via INTRAVENOUS
  Filled 2018-04-27: qty 2

## 2018-04-27 MED ORDER — VANCOMYCIN HCL 1 G IV SOLR
INTRAVENOUS | Status: DC | PRN
  Administered 2018-04-27: 1000 mg

## 2018-04-27 MED ORDER — ACETAMINOPHEN 325 MG PO TABS: 325.0000 mg | ORAL_TABLET | Freq: Four times a day (QID) | ORAL | Status: DC | PRN

## 2018-04-27 MED ORDER — FENTANYL CITRATE (PF) 250 MCG/5ML IJ SOLN
INTRAMUSCULAR | Status: AC
  Filled 2018-04-27: qty 5

## 2018-04-27 MED ORDER — METOCLOPRAMIDE HCL 5 MG PO TABS: 5.0000 mg | ORAL_TABLET | Freq: Three times a day (TID) | ORAL | Status: DC | PRN

## 2018-04-27 MED ORDER — FENTANYL CITRATE (PF) 100 MCG/2ML IJ SOLN
50.0000 ug | INTRAMUSCULAR | Status: DC | PRN
  Administered 2018-04-27: 50 ug via INTRAVENOUS

## 2018-04-27 MED ORDER — POLYETHYLENE GLYCOL 3350 17 G PO PACK: 17.0000 g | PACK | Freq: Every day | ORAL | Status: DC | PRN

## 2018-04-27 MED ORDER — EPHEDRINE 5 MG/ML INJ
INTRAVENOUS | Status: AC
  Filled 2018-04-27: qty 10

## 2018-04-27 MED ORDER — CEFAZOLIN SODIUM-DEXTROSE 2-4 GM/100ML-% IV SOLN
2.0000 g | Freq: Four times a day (QID) | INTRAVENOUS | Status: DC
  Administered 2018-04-27: 2 g via INTRAVENOUS
  Filled 2018-04-27 (×2): qty 100

## 2018-04-27 MED ORDER — PHENOL 1.4 % MT LIQD: 1.0000 | OROMUCOSAL | Status: DC | PRN

## 2018-04-27 MED ORDER — MAGNESIUM CITRATE PO SOLN: 1.0000 | Freq: Once | ORAL | Status: DC | PRN

## 2018-04-27 MED ORDER — SENNOSIDES-DOCUSATE SODIUM 8.6-50 MG PO TABS: 1.0000 | ORAL_TABLET | Freq: Every evening | ORAL | 1 refills | Status: DC | PRN

## 2018-04-27 MED ORDER — ONDANSETRON HCL 4 MG/2ML IJ SOLN
INTRAMUSCULAR | Status: AC
  Filled 2018-04-27: qty 2

## 2018-04-27 MED ORDER — ONDANSETRON HCL 4 MG/2ML IJ SOLN
INTRAMUSCULAR | Status: DC | PRN
  Administered 2018-04-27: 4 mg via INTRAVENOUS

## 2018-04-27 MED ORDER — DEXAMETHASONE SODIUM PHOSPHATE 10 MG/ML IJ SOLN
10.0000 mg | Freq: Once | INTRAMUSCULAR | Status: AC
  Administered 2018-04-28: 10 mg via INTRAVENOUS
  Filled 2018-04-27: qty 1

## 2018-04-27 MED ORDER — VANCOMYCIN HCL 1000 MG IV SOLR
INTRAVENOUS | Status: AC
  Filled 2018-04-27: qty 1000

## 2018-04-27 MED ORDER — GABAPENTIN 300 MG PO CAPS: 300.0000 mg | ORAL_CAPSULE | Freq: Three times a day (TID) | ORAL | Status: DC

## 2018-04-27 MED ORDER — ONDANSETRON HCL 4 MG PO TABS: 4.0000 mg | ORAL_TABLET | Freq: Three times a day (TID) | ORAL | 0 refills | Status: DC | PRN

## 2018-04-27 MED ORDER — PROMETHAZINE HCL 25 MG/ML IJ SOLN: 6.2500 mg | INTRAMUSCULAR | Status: DC | PRN

## 2018-04-27 MED ORDER — DOCUSATE SODIUM 100 MG PO CAPS
100.0000 mg | ORAL_CAPSULE | Freq: Two times a day (BID) | ORAL | Status: DC
  Administered 2018-04-27 – 2018-04-28 (×3): 100 mg via ORAL
  Filled 2018-04-27 (×3): qty 1

## 2018-04-27 MED ORDER — DIPHENHYDRAMINE HCL 12.5 MG/5ML PO ELIX: 25.0000 mg | ORAL_SOLUTION | ORAL | Status: DC | PRN

## 2018-04-27 MED ORDER — FENTANYL CITRATE (PF) 100 MCG/2ML IJ SOLN
INTRAMUSCULAR | Status: DC | PRN
  Administered 2018-04-27: 50 ug via INTRAVENOUS
  Administered 2018-04-27: 100 ug via INTRAVENOUS

## 2018-04-27 MED ORDER — METHOCARBAMOL 500 MG PO TABS: 500.0000 mg | ORAL_TABLET | Freq: Four times a day (QID) | ORAL | Status: DC | PRN

## 2018-04-27 MED ORDER — OXYCODONE-ACETAMINOPHEN 7.5-325 MG PO TABS: 1.0000 | ORAL_TABLET | Freq: Four times a day (QID) | ORAL | 0 refills | Status: DC | PRN

## 2018-04-27 MED ORDER — LACTATED RINGERS IV SOLN: INTRAVENOUS | Status: DC

## 2018-04-27 MED ORDER — ONDANSETRON HCL 4 MG PO TABS
4.0000 mg | ORAL_TABLET | Freq: Four times a day (QID) | ORAL | Status: DC | PRN
  Administered 2018-04-28: 4 mg via ORAL
  Filled 2018-04-27: qty 1

## 2018-04-27 MED ORDER — POTASSIUM CHLORIDE CRYS ER 10 MEQ PO TBCR
10.0000 meq | EXTENDED_RELEASE_TABLET | Freq: Every day | ORAL | Status: DC
  Administered 2018-04-28: 10 meq via ORAL
  Filled 2018-04-27 (×3): qty 1

## 2018-04-27 MED ORDER — HYDROMORPHONE HCL 1 MG/ML IJ SOLN
INTRAMUSCULAR | Status: AC
  Filled 2018-04-27: qty 1

## 2018-04-27 MED ORDER — MIDAZOLAM HCL 2 MG/2ML IJ SOLN
INTRAMUSCULAR | Status: AC
  Filled 2018-04-27: qty 2

## 2018-04-27 MED ORDER — METHOCARBAMOL 750 MG PO TABS: 750.0000 mg | ORAL_TABLET | Freq: Two times a day (BID) | ORAL | 0 refills | Status: DC | PRN

## 2018-04-27 MED ORDER — CEFAZOLIN SODIUM-DEXTROSE 2-4 GM/100ML-% IV SOLN
2.0000 g | Freq: Four times a day (QID) | INTRAVENOUS | Status: AC
  Administered 2018-04-28 (×2): 2 g via INTRAVENOUS
  Filled 2018-04-27 (×2): qty 100

## 2018-04-27 MED ORDER — METHOCARBAMOL 1000 MG/10ML IJ SOLN
500.0000 mg | Freq: Four times a day (QID) | INTRAVENOUS | Status: DC | PRN
  Filled 2018-04-27: qty 5

## 2018-04-27 MED ORDER — LACTATED RINGERS IV SOLN
INTRAVENOUS | Status: DC
  Administered 2018-04-27: 08:00:00 via INTRAVENOUS

## 2018-04-27 MED ORDER — LISINOPRIL-HYDROCHLOROTHIAZIDE 20-12.5 MG PO TABS: 1.0000 | ORAL_TABLET | Freq: Every day | ORAL | Status: DC

## 2018-04-27 MED ORDER — PROPOFOL 500 MG/50ML IV EMUL
INTRAVENOUS | Status: DC | PRN
  Administered 2018-04-27: 100 ug/kg/min via INTRAVENOUS

## 2018-04-27 MED ORDER — CHLORHEXIDINE GLUCONATE CLOTH 2 % EX PADS
6.0000 | MEDICATED_PAD | Freq: Every day | CUTANEOUS | Status: DC
  Administered 2018-04-27 – 2018-04-28 (×2): 6 via TOPICAL

## 2018-04-27 MED ORDER — ACETAMINOPHEN 500 MG PO TABS
1000.0000 mg | ORAL_TABLET | Freq: Four times a day (QID) | ORAL | Status: AC
  Administered 2018-04-27 – 2018-04-28 (×3): 1000 mg via ORAL
  Filled 2018-04-27 (×3): qty 2

## 2018-04-27 MED ORDER — PROMETHAZINE HCL 25 MG PO TABS: 25.0000 mg | ORAL_TABLET | Freq: Four times a day (QID) | ORAL | 1 refills | Status: DC | PRN

## 2018-04-27 MED ORDER — FERROUS SULFATE 325 (65 FE) MG PO TABS
325.0000 mg | ORAL_TABLET | Freq: Every day | ORAL | Status: DC
  Administered 2018-04-28: 325 mg via ORAL
  Filled 2018-04-27: qty 1

## 2018-04-27 MED ORDER — GABAPENTIN 300 MG PO CAPS
300.0000 mg | ORAL_CAPSULE | Freq: Three times a day (TID) | ORAL | Status: DC
  Administered 2018-04-27 – 2018-04-28 (×3): 300 mg via ORAL
  Filled 2018-04-27 (×3): qty 1

## 2018-04-27 MED ORDER — ONDANSETRON HCL 4 MG/2ML IJ SOLN
4.0000 mg | Freq: Four times a day (QID) | INTRAMUSCULAR | Status: DC | PRN
  Administered 2018-04-27 – 2018-04-28 (×3): 4 mg via INTRAVENOUS
  Filled 2018-04-27 (×3): qty 2

## 2018-04-27 MED ORDER — BUPIVACAINE IN DEXTROSE 0.75-8.25 % IT SOLN
INTRATHECAL | Status: DC | PRN
  Administered 2018-04-27: 1.6 mg via INTRATHECAL

## 2018-04-27 MED ORDER — ASPIRIN 81 MG PO CHEW
81.0000 mg | CHEWABLE_TABLET | Freq: Two times a day (BID) | ORAL | Status: DC
  Administered 2018-04-28: 81 mg via ORAL
  Filled 2018-04-27: qty 1

## 2018-04-27 MED ORDER — MIDAZOLAM HCL 5 MG/5ML IJ SOLN
INTRAMUSCULAR | Status: DC | PRN
  Administered 2018-04-27: 2 mg via INTRAVENOUS

## 2018-04-27 MED ORDER — OXYCODONE HCL 5 MG PO TABS
5.0000 mg | ORAL_TABLET | ORAL | Status: DC | PRN
  Administered 2018-04-27: 5 mg via ORAL
  Administered 2018-04-28 (×2): 10 mg via ORAL
  Filled 2018-04-27: qty 2
  Filled 2018-04-27: qty 1
  Filled 2018-04-27: qty 2

## 2018-04-27 MED ORDER — AMLODIPINE BESYLATE 5 MG PO TABS
5.0000 mg | ORAL_TABLET | Freq: Every day | ORAL | Status: DC
  Administered 2018-04-28: 5 mg via ORAL
  Filled 2018-04-27 (×2): qty 1

## 2018-04-27 MED ORDER — PROPOFOL 10 MG/ML IV BOLUS
INTRAVENOUS | Status: AC
  Filled 2018-04-27: qty 20

## 2018-04-27 MED ORDER — SORBITOL 70 % SOLN: 30.0000 mL | Freq: Every day | Status: DC | PRN

## 2018-04-27 MED ORDER — CHLORHEXIDINE GLUCONATE 4 % EX LIQD: 60.0000 mL | Freq: Once | CUTANEOUS | Status: DC

## 2018-04-27 MED ORDER — HYDROXYZINE HCL 10 MG PO TABS
10.0000 mg | ORAL_TABLET | Freq: Three times a day (TID) | ORAL | Status: DC | PRN
  Filled 2018-04-27: qty 1

## 2018-04-27 MED ORDER — FLUOXETINE HCL 20 MG PO CAPS
40.0000 mg | ORAL_CAPSULE | Freq: Two times a day (BID) | ORAL | Status: DC
  Administered 2018-04-28: 40 mg via ORAL
  Filled 2018-04-27: qty 2

## 2018-04-27 MED ORDER — SODIUM CHLORIDE 0.9 % IV SOLN
INTRAVENOUS | Status: DC
  Administered 2018-04-27: 18:00:00 via INTRAVENOUS

## 2018-04-27 MED ORDER — HYDROMORPHONE HCL 1 MG/ML IJ SOLN
0.5000 mg | INTRAMUSCULAR | Status: DC | PRN
  Administered 2018-04-27: 1 mg via INTRAVENOUS

## 2018-04-27 MED ORDER — SODIUM CHLORIDE 0.9 % IV SOLN
INTRAVENOUS | Status: DC | PRN
  Administered 2018-04-27: 25 ug/min via INTRAVENOUS

## 2018-04-27 MED ORDER — PROPOFOL 1000 MG/100ML IV EMUL
INTRAVENOUS | Status: AC
  Filled 2018-04-27: qty 100

## 2018-04-27 MED ORDER — SODIUM CHLORIDE 0.9 % IR SOLN
Status: DC | PRN
  Administered 2018-04-27: 3000 mL

## 2018-04-27 MED ORDER — KETOROLAC TROMETHAMINE 15 MG/ML IJ SOLN
30.0000 mg | Freq: Four times a day (QID) | INTRAMUSCULAR | Status: AC
  Administered 2018-04-27 – 2018-04-28 (×3): 30 mg via INTRAVENOUS
  Filled 2018-04-27 (×3): qty 2

## 2018-04-27 MED ORDER — LISINOPRIL 20 MG PO TABS
20.0000 mg | ORAL_TABLET | Freq: Every day | ORAL | Status: DC
  Administered 2018-04-28: 20 mg via ORAL
  Filled 2018-04-27: qty 1

## 2018-04-27 MED ORDER — OXYCODONE HCL ER 10 MG PO T12A
10.0000 mg | EXTENDED_RELEASE_TABLET | Freq: Two times a day (BID) | ORAL | Status: DC
  Administered 2018-04-27 – 2018-04-28 (×3): 10 mg via ORAL
  Filled 2018-04-27 (×3): qty 1

## 2018-04-27 MED ORDER — EPHEDRINE SULFATE-NACL 50-0.9 MG/10ML-% IV SOSY
PREFILLED_SYRINGE | INTRAVENOUS | Status: DC | PRN
  Administered 2018-04-27: 10 mg via INTRAVENOUS
  Administered 2018-04-27: 5 mg via INTRAVENOUS

## 2018-04-27 MED ORDER — ALUM & MAG HYDROXIDE-SIMETH 200-200-20 MG/5ML PO SUSP: 30.0000 mL | ORAL | Status: DC | PRN

## 2018-04-27 MED ORDER — FENTANYL CITRATE (PF) 100 MCG/2ML IJ SOLN
INTRAMUSCULAR | Status: AC
  Administered 2018-04-27: 50 ug via INTRAVENOUS
  Filled 2018-04-27: qty 2

## 2018-04-27 SURGICAL SUPPLY — 64 items
BAG DECANTER FOR FLEXI CONT (MISCELLANEOUS) ×3 IMPLANT
CELLS DAT CNTRL 66122 CELL SVR (MISCELLANEOUS) IMPLANT
CLOSURE STERI-STRIP 1/2X4 (GAUZE/BANDAGES/DRESSINGS) ×1
CLSR STERI-STRIP ANTIMIC 1/2X4 (GAUZE/BANDAGES/DRESSINGS) ×1 IMPLANT
COVER PERINEAL POST (MISCELLANEOUS) ×3 IMPLANT
COVER SURGICAL LIGHT HANDLE (MISCELLANEOUS) ×3 IMPLANT
COVER WAND RF STERILE (DRAPES) ×3 IMPLANT
CUP SECTOR GRIPTON 50MM (Cup) ×2 IMPLANT
DRAPE C-ARM 42X72 X-RAY (DRAPES) ×3 IMPLANT
DRAPE POUCH INSTRU U-SHP 10X18 (DRAPES) ×3 IMPLANT
DRAPE STERI IOBAN 125X83 (DRAPES) ×3 IMPLANT
DRAPE U-SHAPE 47X51 STRL (DRAPES) ×6 IMPLANT
DRSG AQUACEL AG ADV 3.5X10 (GAUZE/BANDAGES/DRESSINGS) ×3 IMPLANT
DURAPREP 26ML APPLICATOR (WOUND CARE) ×6 IMPLANT
ELECT BLADE 4.0 EZ CLEAN MEGAD (MISCELLANEOUS) ×3
ELECT REM PT RETURN 9FT ADLT (ELECTROSURGICAL) ×3
ELECTRODE BLDE 4.0 EZ CLN MEGD (MISCELLANEOUS) ×1 IMPLANT
ELECTRODE REM PT RTRN 9FT ADLT (ELECTROSURGICAL) ×1 IMPLANT
GLOVE BIOGEL PI IND STRL 6 (GLOVE) IMPLANT
GLOVE BIOGEL PI IND STRL 7.0 (GLOVE) ×1 IMPLANT
GLOVE BIOGEL PI INDICATOR 6 (GLOVE) ×2
GLOVE BIOGEL PI INDICATOR 7.0 (GLOVE) ×4
GLOVE ECLIPSE 6.0 STRL STRAW (GLOVE) ×2 IMPLANT
GLOVE ECLIPSE 7.0 STRL STRAW (GLOVE) ×6 IMPLANT
GLOVE SKINSENSE NS SZ7.5 (GLOVE) ×2
GLOVE SKINSENSE STRL SZ7.5 (GLOVE) ×1 IMPLANT
GLOVE SURG SYN 7.5  E (GLOVE) ×8
GLOVE SURG SYN 7.5 E (GLOVE) ×4 IMPLANT
GLOVE SURG SYN 7.5 PF PI (GLOVE) ×4 IMPLANT
GOWN SRG XL XLNG 56XLVL 4 (GOWN DISPOSABLE) ×1 IMPLANT
GOWN STRL NON-REIN XL XLG LVL4 (GOWN DISPOSABLE) ×3
GOWN STRL REUS W/ TWL LRG LVL3 (GOWN DISPOSABLE) IMPLANT
GOWN STRL REUS W/ TWL XL LVL3 (GOWN DISPOSABLE) ×1 IMPLANT
GOWN STRL REUS W/TWL LRG LVL3 (GOWN DISPOSABLE)
GOWN STRL REUS W/TWL XL LVL3 (GOWN DISPOSABLE) ×3
HANDPIECE INTERPULSE COAX TIP (DISPOSABLE) ×3
HEAD FEMORAL 32 CERAMIC (Hips) ×2 IMPLANT
HOOD PEEL AWAY FLYTE STAYCOOL (MISCELLANEOUS) ×10 IMPLANT
IV NS IRRIG 3000ML ARTHROMATIC (IV SOLUTION) ×3 IMPLANT
KIT BASIN OR (CUSTOM PROCEDURE TRAY) ×3 IMPLANT
LINER ACET PNNCL PLUS4 NEUTRAL (Hips) IMPLANT
MARKER SKIN DUAL TIP RULER LAB (MISCELLANEOUS) ×3 IMPLANT
NDL SPNL 18GX3.5 QUINCKE PK (NEEDLE) ×1 IMPLANT
NEEDLE SPNL 18GX3.5 QUINCKE PK (NEEDLE) ×3 IMPLANT
PACK TOTAL JOINT (CUSTOM PROCEDURE TRAY) ×3 IMPLANT
PACK UNIVERSAL I (CUSTOM PROCEDURE TRAY) ×3 IMPLANT
PINNACLE PLUS 4 NEUTRAL (Hips) ×3 IMPLANT
RETRACTOR WND ALEXIS 18 MED (MISCELLANEOUS) IMPLANT
RTRCTR WOUND ALEXIS 18CM MED (MISCELLANEOUS)
SAW OSC TIP CART 19.5X105X1.3 (SAW) ×3 IMPLANT
SET HNDPC FAN SPRY TIP SCT (DISPOSABLE) ×1 IMPLANT
STAPLER VISISTAT 35W (STAPLE) IMPLANT
STEM FEM ACTIS STD SZ4 (Stem) ×2 IMPLANT
SUT ETHIBOND 2 V 37 (SUTURE) ×3 IMPLANT
SUT ETHILON 2 0 FS 18 (SUTURE) ×6 IMPLANT
SUT MNCRL AB 3-0 PS2 18 (SUTURE) ×2 IMPLANT
SUT VIC AB 1 CT1 27 (SUTURE) ×6
SUT VIC AB 1 CT1 27XBRD ANBCTR (SUTURE) ×1 IMPLANT
SUT VIC AB 2-0 CT1 27 (SUTURE) ×6
SUT VIC AB 2-0 CT1 TAPERPNT 27 (SUTURE) ×2 IMPLANT
SYRINGE 60CC LL (MISCELLANEOUS) ×3 IMPLANT
TOWEL OR 17X26 10 PK STRL BLUE (TOWEL DISPOSABLE) ×3 IMPLANT
TRAY CATH 16FR W/PLASTIC CATH (SET/KITS/TRAYS/PACK) IMPLANT
YANKAUER SUCT BULB TIP NO VENT (SUCTIONS) ×3 IMPLANT

## 2018-04-27 NOTE — Op Note (Signed)
RIGHT TOTAL HIP ARTHROPLASTY ANTERIOR APPROACH  Procedure Note Olivia Ewing   [id number]  Pre-op Diagnosis: avascular necrosis right hip     Post-op Diagnosis: same   Operative Procedures  1. Total hip replacement; Right hip; uncemented [id number]   Personnel  Surgeon(s): Leandrew Koyanagi, MD  Assist: Madalyn Rob, PA-C; necessary for the timely completion of procedure and due to complexity of procedure.   Anesthesia: spinal  Prosthesis: Depuy Acetabulum: Pinnacle 50 mm Femur: Actis 4 STD Head: 32 mm size: +1 Liner: +4 neutral Bearing Type: ceramic on poly  Total Hip Arthroplasty (Anterior Approach) Op Note:  After informed consent was obtained and the operative extremity marked in the holding area, the patient was brought back to the operating room and placed supine on the HANA table. Next, the operative extremity was prepped and draped in normal sterile fashion. Surgical timeout occurred verifying patient identification, surgical site, surgical procedure and administration of antibiotics.  A modified anterior Smith-Peterson approach to the hip was performed, using the interval between tensor fascia lata and sartorius.  Dissection was carried bluntly down onto the anterior hip capsule. The lateral femoral circumflex vessels were identified and coagulated. A capsulotomy was performed and the capsular flaps tagged for later repair.  Fluoroscopy was utilized to prepare for the femoral neck cut. The neck osteotomy was performed. The femoral head was removed, the acetabular rim was cleared of soft tissue and attention was turned to reaming the acetabulum.  Sequential reaming was performed under fluoroscopic guidance. We reamed to a size 49 mm, and then impacted the acetabular shell. The liner was then placed after irrigation and attention turned to the femur.  After placing the femoral hook, the leg was taken to externally rotated, extended and adducted position taking care to  perform soft tissue releases to allow for adequate mobilization of the femur. Soft tissue was cleared from the shoulder of the greater trochanter and the hook elevator used to improve exposure of the proximal femur. Sequential broaching performed up to a size 4. Trial neck and head were placed. The leg was brought back up to neutral and the construct reduced. The position and sizing of components, offset and leg lengths were checked using fluoroscopy. Stability of the  construct was checked in extension and external rotation without any subluxation or impingement of prosthesis. We dislocated the prosthesis, dropped the leg back into position, removed trial components, and irrigated copiously. The final stem and head was then placed, the leg brought back up, the system reduced and fluoroscopy used to verify positioning.  We irrigated, obtained hemostasis and closed the capsule using #2 ethibond suture.  One gram of vancomycin powder was placed in the surgical bed. The fascia was closed with #1 vicryl plus, the deep fat layer was closed with 0 vicryl, the subcutaneous layers closed with 2.0 Vicryl Plus and the skin closed with 3.0 monocryl and steri strips. A sterile dressing was applied. The patient was awakened in the operating room and taken to recovery in stable condition.  All sponge, needle, and instrument counts were correct at the end of the case.   Position: supine  Complications: none.  Time Out: performed   Drains/Packing: noen  Estimated blood loss: see anesthesia record  Returned to Recovery Room: in good condition.   Antibiotics: yes   Mechanical VTE (DVT) Prophylaxis: sequential compression devices, TED thigh-high  Chemical VTE (DVT) Prophylaxis: aspirin   Fluid Replacement: see anesthesia record  Specimens Removed: 1 to pathology  Sponge and Instrument Count Correct? yes   PACU: portable radiograph - low AP   Plan/RTC: Return in 2 weeks for staple removal. Weight  Bearing/Load Lower Extremity: full  Hip precautions: none Suture Removal: 2 weeks   N. Eduard Roux, MD [place] 11:46 AM     Implant Name Type Inv. Item Serial No. Manufacturer Lot No. LRB No. Used  CUP SECTOR GRIPTON 50MM - [id number] Cup CUP SECTOR GRIPTON 50MM  DEPUY SYNTHES [id number] Right 1  PINNACLE PLUS 4 NEUTRAL - [id number] Hips PINNACLE PLUS 4 NEUTRAL  DEPUY SYNTHES [id number] Right 1  STEM FEM ACTIS STD SZ4 - [id number] Stem STEM FEM ACTIS STD SZ4  DEPUY SYNTHES [id number] Right 1  HEAD FEMORAL 32 CERAMIC - [id number] Hips HEAD FEMORAL 32 CERAMIC  DEPUY SYNTHES [id number] Right 1

## 2018-04-27 NOTE — Anesthesia Preprocedure Evaluation (Signed)
Anesthesia Evaluation  Patient identified by MRN, date of birth, ID band Patient awake    Reviewed: Allergy & Precautions, NPO status , Patient's Chart, lab work & pertinent test results  Airway Mallampati: II  TM Distance: >3 FB Neck ROM: Full    Dental no notable dental hx. (+) Dental Advisory Given   Pulmonary neg pulmonary ROS, former smoker,    Pulmonary exam normal        Cardiovascular hypertension, negative cardio ROS Normal cardiovascular exam     Neuro/Psych PSYCHIATRIC DISORDERS Depression Bipolar Disorder negative neurological ROS     GI/Hepatic Neg liver ROS, GERD  ,  Endo/Other  negative endocrine ROS  Renal/GU negative Renal ROS  negative genitourinary   Musculoskeletal  (+) Arthritis ,   Abdominal   Peds negative pediatric ROS (+)  Hematology negative hematology ROS (+)   Anesthesia Other Findings   Reproductive/Obstetrics negative OB ROS                             Anesthesia Physical Anesthesia Plan  ASA: III  Anesthesia Plan: Spinal   Post-op Pain Management:    Induction:   PONV Risk Score and Plan: 2 and Ondansetron and Propofol infusion  Airway Management Planned: Natural Airway and Simple Face Mask  Additional Equipment:   Intra-op Plan:   Post-operative Plan:   Informed Consent: I have reviewed the patients History and Physical, chart, labs and discussed the procedure including the risks, benefits and alternatives for the proposed anesthesia with the patient or authorized representative who has indicated his/her understanding and acceptance.     Dental advisory given  Plan Discussed with: CRNA and Anesthesiologist  Anesthesia Plan Comments:         Anesthesia Quick Evaluation

## 2018-04-27 NOTE — Anesthesia Postprocedure Evaluation (Signed)
Anesthesia Post Note  Patient: Olivia Ewing  Procedure(s) Performed: RIGHT TOTAL HIP ARTHROPLASTY ANTERIOR APPROACH (Right Hip)     Patient location during evaluation: PACU Anesthesia Type: Spinal Level of consciousness: awake and alert Pain management: pain level controlled Vital Signs Assessment: post-procedure vital signs reviewed and stable Respiratory status: spontaneous breathing and respiratory function stable Cardiovascular status: blood pressure returned to baseline and stable Postop Assessment: spinal receding Anesthetic complications: no    Last Vitals:  Vitals:   04/27/18 1300 04/27/18 1331  BP: (!) 132/98 134/78  Pulse: 68 74  Resp: 14 18  Temp: 36.4 C (!) 36.4 C  SpO2: 95% (!) 68%    Last Pain:  Vitals:   04/27/18 1331  TempSrc: Oral  PainSc:                  Agnieszka Newhouse DANIEL

## 2018-04-27 NOTE — Discharge Instructions (Signed)

## 2018-04-27 NOTE — Anesthesia Procedure Notes (Signed)
Spinal  Patient location during procedure: OR Start time: 04/27/2018 10:08 AM End time: 04/27/2018 10:18 AM Staffing Anesthesiologist: Duane Boston, MD Performed: anesthesiologist  Preanesthetic Checklist Completed: patient identified, surgical consent, pre-op evaluation, timeout performed, IV checked, risks and benefits discussed and monitors and equipment checked Spinal Block Patient position: sitting Prep: DuraPrep Patient monitoring: cardiac monitor, continuous pulse ox and blood pressure Approach: midline Location: L2-3 Injection technique: single-shot Needle Needle type: Pencan  Needle gauge: 24 G Needle length: 9 cm Additional Notes Functioning IV was confirmed and monitors were applied. Sterile prep and drape, including hand hygiene and sterile gloves were used. The patient was positioned and the spine was prepped. The skin was anesthetized with lidocaine.  Free flow of clear CSF was obtained prior to injecting local anesthetic into the CSF.  The spinal needle aspirated freely following injection.  The needle was carefully withdrawn.  The patient tolerated the procedure well.

## 2018-04-27 NOTE — H&P (Signed)
PREOPERATIVE H&P  Chief Complaint: avascular necrosis right hip  HPI: AURIELLA Ewing is a 68 y.o. female who presents for surgical treatment of avascular necrosis right hip.  She denies any changes in medical history.  Past Medical History:  Diagnosis Date  . Anemia   . Arthritis    knees hands  . Bipolar disorder ([place])   . Cervicalgia   . Depression   . Environmental allergies    cause SOB, uses inhaler for  . Environmental and seasonal allergies    uses inhaler prn  . Full dentures   . GERD (gastroesophageal reflux disease)    diet controlled - no meds  . Hyperlipidemia    diet controlled, no meds  . Hypertension   . Lumbar radiculopathy, chronic   . Right knee pain    posterior horn medial meniscal tear MRI 2013  . Spinal stenosis    getting epidural injections -last one 06/12/2015  . SVD (spontaneous vaginal delivery)    x 4   Past Surgical History:  Procedure Laterality Date  . APPENDECTOMY    . Bladder tack  10/2006   cystocoele  . BREAST SURGERY Right    benign cyst  . COLONOSCOPY    . Hysterectomy other    . TUBAL LIGATION     Social History   Socioeconomic History  . Marital status: Divorced    Spouse name: Not on file  . Number of children: 4  . Years of education: Not on file  . Highest education level: Not on file  Occupational History  . Not on file  Social Needs  . Financial resource strain: Not on file  . Food insecurity:    Worry: Never true    Inability: Never true  . Transportation needs:    Medical: Yes    Non-medical: Yes  Tobacco Use  . Smoking status: Former Smoker    Packs/day: 0.10    Last attempt to quit: 11/26/1983    Years since quitting: 34.4  . Smokeless tobacco: Never Used  Substance and Sexual Activity  . Alcohol use: Yes    Alcohol/week: 0.0 standard drinks    Comment: rarely  . Drug use: Not Currently    Types: Marijuana    Comment: last used 2019  . Sexual activity: Not Currently    Birth  control/protection: Surgical  Lifestyle  . Physical activity:    Days per week: Not on file    Minutes per session: Not on file  . Stress: Not on file  Relationships  . Social connections:    Talks on phone: Not on file    Gets together: Not on file    Attends religious service: Not on file    Active member of club or organization: Not on file    Attends meetings of clubs or organizations: Not on file    Relationship status: Not on file  Other Topics Concern  . Not on file  Social History Narrative   Single. 9 siblings. 1 brother with HIV, 1 sister with hypertension. 4 children, 1 daughter with HTN and depression, 1 son with HTN.   [phone]- shelter's number.    Former smoker, no alcohol or drug use.   Family History  Problem Relation Age of Onset  . Hypertension Mother    No Known Allergies Prior to Admission medications   Medication Sig Start Date End Date Taking? Authorizing Provider  albuterol (PROVENTIL HFA;VENTOLIN HFA) 108 (90 Base) MCG/ACT inhaler Inhale 1-2 puffs into  the lungs every 6 (six) hours as needed for wheezing or shortness of breath. 01/02/18  Yes Ledell Noss, MD  amLODipine (NORVASC) 5 MG tablet Take 1 tablet (5 mg total) by mouth daily. 01/02/18  Yes Ledell Noss, MD  cyclobenzaprine (FLEXERIL) 5 MG tablet TAKE 1 TABLET BY MOUTH THREE TIMES A DAY AS NEEDED FOR MUSCLE SPASMS 04/14/18  Yes Lilia Argue R, DO  ferrous sulfate 325 (65 FE) MG tablet Take 1 tablet (325 mg total) by mouth daily with breakfast. 02/20/17  Yes Lenore Cordia, MD  FLUoxetine (PROZAC) 40 MG capsule Take 40 mg by mouth 2 (two) times daily.  03/05/18  Yes [provider]  gabapentin (NEURONTIN) 300 MG capsule Take 2 tabs three times a day Patient taking differently: Take 300 mg by mouth 3 (three) times daily.  05/12/15  Yes Dickie La, MD  hydrOXYzine (ATARAX/VISTARIL) 10 MG tablet Take 10 mg by mouth 3 (three) times daily as needed for anxiety.   Yes [provider]   lisinopril-hydrochlorothiazide (PRINZIDE,ZESTORETIC) 20-12.5 MG tablet TAKE 1 TABLET BY MOUTH DAILY 02/17/18  Yes Katherine Roan, MD  Multiple Vitamin (MULTIVITAMIN WITH MINERALS) TABS tablet Take 1 tablet by mouth daily.   Yes [provider]  potassium chloride (K-DUR) 10 MEQ tablet Take 14meq bid x 4 days for low potassium. 04/21/18  Yes Aundra Dubin, PA-C  promethazine (PHENERGAN) 25 MG tablet Take 25 mg by mouth every 6 (six) hours as needed for nausea or vomiting.   Yes [provider]  QUEtiapine (SEROQUEL) 50 MG tablet Take 50 mg by mouth at bedtime.   Yes [provider]  vitamin E 400 UNIT capsule Take 400 Units by mouth daily.   Yes [provider]  diclofenac (VOLTAREN) 75 MG EC tablet Take one tab twice daily with food Patient taking differently: Take 75 mg by mouth 2 (two) times daily as needed (pain).  03/24/18   Hudnall, Sharyn Lull, MD  HYDROcodone-acetaminophen (NORCO) 5-325 MG tablet Take 1-2 tablets by mouth daily as needed. Patient not taking: Reported on 04/13/2018 03/31/18   Leandrew Koyanagi, MD  oxyCODONE-acetaminophen (PERCOCET/ROXICET) 5-325 MG tablet Take 1-2 tablets by mouth every 6 (six) hours as needed. Patient not taking: Reported on 04/13/2018 03/02/18   Lavonia Drafts, MD  phenazopyridine (PYRIDIUM) 200 MG tablet Take 1 tablet (200 mg total) by mouth 3 (three) times daily as needed for pain. Patient not taking: Reported on 04/13/2018 09/21/17   Tamala Julian [place], CNM     Positive ROS: All other systems have been reviewed and were otherwise negative with the exception of those mentioned in the HPI and as above.  Physical Exam: General: Alert, no acute distress Cardiovascular: No pedal edema Respiratory: No cyanosis, no use of accessory musculature GI: abdomen soft Skin: No lesions in the area of chief complaint Neurologic: Sensation intact distally Psychiatric: Patient is competent for consent with normal mood and affect  Lymphatic: no lymphedema  MUSCULOSKELETAL: exam stable  Assessment: avascular necrosis right hip  Plan: Plan for Procedure(s): RIGHT TOTAL HIP ARTHROPLASTY ANTERIOR APPROACH  The risks benefits and alternatives were discussed with the patient including but not limited to the risks of nonoperative treatment, versus surgical intervention including infection, bleeding, nerve injury,  blood clots, cardiopulmonary complications, morbidity, mortality, among others, and they were willing to proceed.   Preoperative templating of the joint replacement has been completed, documented, and submitted to the Operating Room personnel in order to optimize intra-operative equipment management.  Legrand Como  Erlinda Hong, MD   04/27/2018 9:23 AM

## 2018-04-27 NOTE — Addendum Note (Signed)
Addendum  created 04/27/18 1403 by Kyung Rudd, CRNA   Intraprocedure Event edited

## 2018-04-27 NOTE — Progress Notes (Signed)
Physical Therapy Evaluation Patient Details Name: Olivia Ewing MRN: [id number] DOB: 19-Apr-1950 Today's Date: 04/27/2018   History of Present Illness  Patient is 68 y/o female s/p R THA. PMH includes anemia, bipolar disorder, HTN, HLD, GERD, and spinal stenosis.  Clinical Impression  Patient admitted to hospital secondary to problems above and with deficits below. Patient required min guard to stand and ambulate in room with use of RW. Educated and reviewed HEP with patient and family. Patient will continue to benefit from acute physical therapy to maximize independence and safety with functional mobility.     Follow Up Recommendations Follow surgeon's recommendation for DC plan and follow-up therapies    Equipment Recommendations  Rolling walker with 5" wheels;3in1 (PT)    Recommendations for Other Services       Precautions / Restrictions Precautions Precautions: Fall Restrictions Weight Bearing Restrictions: Yes RLE Weight Bearing: Weight bearing as tolerated      Mobility  Bed Mobility Overal bed mobility: Needs Assistance Bed Mobility: Supine to Sit     Supine to sit: Min assist     General bed mobility comments: Patient required minA for RLE management to sit on the EOB.    Transfers Overall transfer level: Needs assistance Equipment used: Rolling walker (2 wheeled) Transfers: Sit to/from Stand Sit to Stand: Min guard         General transfer comment: Patient required min guard to stand with use of RW for safety. Verbal cues for safe hand placement prior to standing when using RW. Reports dizziness upon standing that subsided  Ambulation/Gait Ambulation/Gait assistance: Min guard Gait Distance (Feet): 20 Feet Assistive device: Rolling walker (2 wheeled) Gait Pattern/deviations: Step-through pattern;Decreased stride length;Antalgic;Decreased step length - left;Decreased stance time - right;Decreased weight shift to right Gait velocity: decreased Gait  velocity interpretation: <1.8 ft/sec, indicate of risk for recurrent falls General Gait Details: Patient ambulated short distance in room with min guard and RW for safety. No LOB noted.   Stairs            Wheelchair Mobility    Modified Rankin (Stroke Patients Only)       Balance Overall balance assessment: Needs assistance Sitting-balance support: No upper extremity supported;Feet supported Sitting balance-Leahy Scale: Good     Standing balance support: Bilateral upper extremity supported Standing balance-Leahy Scale: Poor Standing balance comment: reliant on BUE support to maintain standing balance                             Pertinent Vitals/Pain Pain Assessment: 0-10 Pain Score: 10-Worst pain ever Pain Location: R hip Pain Descriptors / Indicators: Aching;Operative site guarding;Guarding;Discomfort Pain Intervention(s): Limited activity within patient's tolerance;Monitored during session;Premedicated before session;Ice applied    Home Living Family/patient expects to be discharged to:: Private residence Living Arrangements: Other relatives(aunt) Available Help at Discharge: Family;Available 24 hours/day Type of Home: House Home Access: Ramped entrance     Home Layout: One level Home Equipment: Shower seat;Cane - single point      Prior Function Level of Independence: Independent with assistive device(s)         Comments: Reports using a cane for mobility     Hand Dominance        Extremity/Trunk Assessment   Upper Extremity Assessment Upper Extremity Assessment: Overall WFL for tasks assessed    Lower Extremity Assessment Lower Extremity Assessment: RLE deficits/detail RLE Deficits / Details: RLE deficits consistent with post op pain and weakness  Cervical / Trunk Assessment Cervical / Trunk Assessment: Normal  Communication   Communication: No difficulties  Cognition Arousal/Alertness: Awake/alert Behavior During Therapy:  WFL for tasks assessed/performed Overall Cognitive Status: Within Functional Limits for tasks assessed                                        General Comments General comments (skin integrity, edema, etc.): Patient grandson in room during session    Exercises Total Joint Exercises Ankle Circles/Pumps: AROM;Both;20 reps;Seated Quad Sets: AROM;Right;Other reps (comment);Seated(1) Heel Slides: AROM;Right;Other reps (comment);Seated(1)   Assessment/Plan    PT Assessment Patient needs continued PT services  PT Problem List Decreased strength;Decreased range of motion;Decreased activity tolerance;Decreased balance;Decreased mobility;Decreased knowledge of use of DME;Decreased knowledge of precautions;Pain       PT Treatment Interventions DME instruction;Gait training;Stair training;Functional mobility training;Therapeutic activities;Therapeutic exercise;Balance training;Patient/family education    PT Goals (Current goals can be found in the Care Plan section)  Acute Rehab PT Goals Patient Stated Goal: go home PT Goal Formulation: With patient Time For Goal Achievement: 05/11/18 Potential to Achieve Goals: Good    Frequency 7X/week   Barriers to discharge        Co-evaluation               AM-PAC PT "6 Clicks" Mobility  Outcome Measure Help needed turning from your back to your side while in a flat bed without using bedrails?: None Help needed moving from lying on your back to sitting on the side of a flat bed without using bedrails?: A Little Help needed moving to and from a bed to a chair (including a wheelchair)?: A Little Help needed standing up from a chair using your arms (e.g., wheelchair or bedside chair)?: A Little Help needed to walk in hospital room?: A Little Help needed climbing 3-5 steps with a railing? : A Lot 6 Click Score: 18    End of Session Equipment Utilized During Treatment: Gait belt Activity Tolerance: Patient tolerated  treatment well Patient left: in chair;with call bell/phone within reach;with family/visitor present Nurse Communication: Mobility status PT Visit Diagnosis: Other abnormalities of gait and mobility (R26.89);Muscle weakness (generalized) (M62.81);Difficulty in walking, not elsewhere classified (R26.2);Pain Pain - Right/Left: Right Pain - part of body: Hip    Time: 3491-7915 PT Time Calculation (min) (ACUTE ONLY): 20 min   Charges:   PT Evaluation $PT Eval Low Complexity: 1 Low          Erick Blinks, SPT  Erick Blinks 04/27/2018, 4:22 PM

## 2018-04-27 NOTE — Transfer of Care (Signed)
Immediate Anesthesia Transfer of Care Note  Patient: Olivia Ewing  Procedure(s) Performed: RIGHT TOTAL HIP ARTHROPLASTY ANTERIOR APPROACH (Right Hip)  Patient Location: PACU  Anesthesia Type:Spinal  Level of Consciousness: awake, alert  and oriented  Airway & Oxygen Therapy: Patient Spontanous Breathing and Patient connected to face mask oxygen  Post-op Assessment: Report given to RN and Post -op Vital signs reviewed and stable  Post vital signs: Reviewed and stable  Last Vitals:  Vitals Value Taken Time  BP 111/68 04/27/2018 12:28 PM  Temp 36.3 C 04/27/2018 12:27 PM  Pulse 64 04/27/2018 12:30 PM  Resp 19 04/27/2018 12:30 PM  SpO2 98 % 04/27/2018 12:30 PM  Vitals shown include unvalidated device data.  Last Pain:  Vitals:   04/27/18 1227  TempSrc:   PainSc: 0-No pain         Complications: No apparent anesthesia complications

## 2018-04-28 ENCOUNTER — Encounter (HOSPITAL_COMMUNITY): Payer: Self-pay | Admitting: Orthopaedic Surgery

## 2018-04-28 DIAGNOSIS — M87851 Other osteonecrosis, right femur: Secondary | ICD-10-CM | POA: Diagnosis not present

## 2018-04-28 LAB — CBC
HCT: 28 % — ABNORMAL LOW (ref 36.0–46.0)
Hemoglobin: 8.4 g/dL — ABNORMAL LOW (ref 12.0–15.0)
MCH: 25.6 pg — ABNORMAL LOW (ref 26.0–34.0)
MCHC: 30 g/dL (ref 30.0–36.0)
MCV: 85.4 fL (ref 80.0–100.0)
Platelets: 250 10*3/uL (ref 150–400)
RBC: 3.28 MIL/uL — ABNORMAL LOW (ref 3.87–5.11)
RDW: 14.4 % (ref 11.5–15.5)
WBC: 8.1 10*3/uL (ref 4.0–10.5)
nRBC: 0 % (ref 0.0–0.2)

## 2018-04-28 LAB — BASIC METABOLIC PANEL
ANION GAP: 7 (ref 5–15)
BUN: 12 mg/dL (ref 8–23)
CO2: 27 mmol/L (ref 22–32)
Calcium: 8.6 mg/dL — ABNORMAL LOW (ref 8.9–10.3)
Chloride: 101 mmol/L (ref 98–111)
Creatinine, Ser: 1.06 mg/dL — ABNORMAL HIGH (ref 0.44–1.00)
GFR calc Af Amer: >60 mL/min (ref >60)
GFR calc non Af Amer: 55 mL/min — ABNORMAL LOW (ref >60)
Glucose, Bld: 118 mg/dL — ABNORMAL HIGH (ref 70–99)
POTASSIUM: 4.3 mmol/L (ref 3.5–5.1)
Sodium: 135 mmol/L (ref 135–145)

## 2018-04-28 MED FILL — OXYCODON-ACETAMINOPHEN 7.5-: 7.5-325 | 8 days supply | Qty: 30 | Fill #0

## 2018-04-28 MED FILL — ASPIRIN ADULT LOW STRENGTH: 81 | 42 days supply | Qty: 84 | Fill #0

## 2018-04-28 MED FILL — PROMETHAZINE 25 MG TABLET: 25 | 8 days supply | Qty: 30 | Fill #0

## 2018-04-28 MED FILL — METHOCARBAMOL 750 MG TABS: 750 | 30 days supply | Qty: 60 | Fill #0

## 2018-04-28 MED FILL — ONDANSETRON HCL 4 MG TABLET: 4 | 7 days supply | Qty: 40 | Fill #0

## 2018-04-28 NOTE — Progress Notes (Addendum)
Subjective:  1 Day Post-Op Procedure(s) (LRB): RIGHT TOTAL HIP ARTHROPLASTY ANTERIOR APPROACH (Right) Patient reports pain as mild.  Doing well this am.  Became a little nauseous after taking am dose of oxy (had not eaten anything prior).  Feeling ok now.  Ready to go home asap!  Objective: Vital signs in last 24 hours: Temp:  [97.3 F (36.3 C)-98.9 F (37.2 C)] 98.9 F (37.2 C) (03/17 0356) Pulse Rate:  [61-88] 88 (03/17 0356) Resp:  [14-29] 18 (03/17 0356) BP: (111-148)/(68-98) 134/70 (03/17 0356) SpO2:  [68 %-99 %] 95 % (03/17 0356) Weight:  [82.6 kg] 82.6 kg (03/16 1331)  Intake/Output from previous day: 03/16 0701 - 03/17 0700 In: 1095.2 [P.O.:360; I.V.:735.2] Out: 1650 [Urine:1450; Blood:200] Intake/Output this shift: No intake/output data recorded.  Recent Labs    04/28/18 0515  HGB 8.4*   Recent Labs    04/28/18 0515  WBC 8.1  RBC 3.28*  HCT 28.0*  PLT 250   Recent Labs    04/28/18 0515  NA 135  K 4.3  CL 101  CO2 27  BUN 12  CREATININE 1.06*  GLUCOSE 118*  CALCIUM 8.6*   No results for input(s): LABPT, INR in the last 72 hours.  Neurologically intact Neurovascular intact Sensation intact distally Intact pulses distally Dorsiflexion/Plantar flexion intact Incision: dressing C/D/I No cellulitis present Compartment soft   Assessment/Plan: 1 Day Post-Op Procedure(s) (LRB): RIGHT TOTAL HIP ARTHROPLASTY ANTERIOR APPROACH (Right) Advance diet Up with therapy D/C IV fluids Discharge home with home health  WBAT RLE PLEASE APPLY TED HOSE PRIOR TO D/C ABLA on top of chronic anemia- patient asymptomatic this am   Anticipated LOS equal to or greater than 2 midnights due to - Age 67 and older with one or more of the following:  - Obesity  - Expected need for hospital services (PT, OT, Nursing) required for safe  discharge  - Anticipated need for postoperative skilled nursing care or inpatient rehab  - Active co-morbidities: anemia OR   -  Unanticipated findings during/Post Surgery: Slow post-op progression: GI, pain control, mobility  - Patient is a high risk of re-admission due to: None    Aundra Dubin 04/28/2018, 7:42 AM

## 2018-04-28 NOTE — Progress Notes (Signed)
Physical Therapy Treatment Patient Details Name: Olivia Ewing MRN: [id number] DOB: 1950/04/07 Today's Date: 04/28/2018    History of Present Illness Patient is 68 y/o female s/p R THA. PMH includes anemia, bipolar disorder, HTN, HLD, GERD, and spinal stenosis.    PT Comments    Patient seen for mobility progression. Pt is making progress toward PT goals and tolerated mobility well. HEP next session.    Follow Up Recommendations  Follow surgeon's recommendation for DC plan and follow-up therapies     Equipment Recommendations  Rolling walker with 5" wheels;3in1 (PT)    Recommendations for Other Services       Precautions / Restrictions Precautions Precautions: Fall Restrictions Weight Bearing Restrictions: Yes RLE Weight Bearing: Weight bearing as tolerated    Mobility  Bed Mobility Overal bed mobility: Modified Independent Bed Mobility: Supine to Sit;Sit to Supine           General bed mobility comments: increased time and effort  Transfers Overall transfer level: Modified independent Equipment used: Rolling walker (2 wheeled) Transfers: Sit to/from Stand           General transfer comment: use of RW upon standing  Ambulation/Gait Ambulation/Gait assistance: Min Gaffer (Feet): 300 Feet Assistive device: Rolling walker (2 wheeled) Gait Pattern/deviations: Step-through pattern;Decreased stride length;Decreased weight shift to right;Antalgic Gait velocity: decreased   General Gait Details: mildly antalgic but good step through pattern   Stairs Stairs: Yes Stairs assistance: Min guard;Min assist Stair Management: No rails;Step to pattern;Forwards;With walker;Two rails   General stair comments: cues for sequencing and technique; single step with RW and min A and 5 steps with bilat hand rails with min guard for safety   Wheelchair Mobility    Modified Rankin (Stroke Patients Only)       Balance Overall balance  assessment: Needs assistance Sitting-balance support: No upper extremity supported;Feet supported Sitting balance-Leahy Scale: Good     Standing balance support: Bilateral upper extremity supported Standing balance-Leahy Scale: Poor Standing balance comment: UE support for ambulation and able to static stand without UE support                             Cognition Arousal/Alertness: Awake/alert Behavior During Therapy: WFL for tasks assessed/performed Overall Cognitive Status: Within Functional Limits for tasks assessed                                        Exercises      General Comments        Pertinent Vitals/Pain Pain Assessment: 0-10 Pain Score: 7  Pain Location: R hip Pain Descriptors / Indicators: Guarding;Discomfort;Sore Pain Intervention(s): Limited activity within patient's tolerance;Monitored during session;Premedicated before session;Repositioned    Home Living                      Prior Function            PT Goals (current goals can now be found in the care plan section) Acute Rehab PT Goals Patient Stated Goal: go home Progress towards PT goals: Progressing toward goals    Frequency    7X/week      PT Plan Current plan remains appropriate    Co-evaluation              AM-PAC PT "6 Clicks" Mobility   Outcome Measure  Help needed turning from your back to your side while in a flat bed without using bedrails?: None Help needed moving from lying on your back to sitting on the side of a flat bed without using bedrails?: None Help needed moving to and from a bed to a chair (including a wheelchair)?: None Help needed standing up from a chair using your arms (e.g., wheelchair or bedside chair)?: None Help needed to walk in hospital room?: A Little Help needed climbing 3-5 steps with a railing? : A Little 6 Click Score: 22    End of Session Equipment Utilized During Treatment: Gait belt Activity  Tolerance: Patient tolerated treatment well Patient left: with call bell/phone within reach;with family/visitor present;in bed Nurse Communication: Mobility status PT Visit Diagnosis: Other abnormalities of gait and mobility (R26.89);Muscle weakness (generalized) (M62.81);Difficulty in walking, not elsewhere classified (R26.2);Pain Pain - Right/Left: Right Pain - part of body: Hip     Time: 7035-0093 PT Time Calculation (min) (ACUTE ONLY): 33 min  Charges:  $Gait Training: 23-37 mins                     Earney Navy, PTA Acute Rehabilitation Services Pager: [phone] Office: [phone]     Darliss Cheney 04/28/2018, 9:25 AM

## 2018-04-28 NOTE — Progress Notes (Addendum)
CSW acknowledges SNF consult. Patient appears to be excelling with PT and MD discharge plan states home with self care per last note.   CSW signing off no needs at this time.   [place], [place]

## 2018-04-28 NOTE — Progress Notes (Signed)
   04/28/18 1300  Clinical Encounter Type  Visited With Patient and family together;Health care provider  Visit Type Initial;Other (Comment) (AD request)  Referral From Physician  Spiritual Encounters  Spiritual Needs Brochure  Stress Factors  Patient Stress Factors Exhausted   AD request, pt set to d/c today, doesn't have expected procedures.  Gave AD packet to pt, explained it can be completed at any time and with any notary, not just at hospital.  A visitor is rm was coughing a lot and sniffling, chaplain let charge RN know when exited the rm.  Myra Gianotti resident, [phone]

## 2018-04-28 NOTE — Care Management Obs Status (Signed)
[facility] NOTIFICATION   Patient Details  Name: Olivia Ewing MRN: [id number] Date of Birth: [birth year]   Medicare Observation Status Notification Given:       Ninfa Meeker, RN 04/28/2018, 1:08 PM

## 2018-04-28 NOTE — Progress Notes (Signed)
Discharge instructions and paperwork given to pt. Pt is not in distress and tolerated well.

## 2018-04-28 NOTE — TOC Initial Note (Signed)
Transition of Care [facility]) - Initial/Assessment Note    Patient Details  Name: Olivia Ewing MRN: [id number] Date of Birth: Mar 31, 1950  Transition of Care [facility]) CM/SW Contact:    Ninfa Meeker, RN Phone Number: 04/28/2018, 1:18 PM  Clinical Narrative:  68 yr old female s/p right total hip arthroplasty. CM discussed discharge plan and DME. Patient was preoperatively setup with [facility], no changes. Will have family support at discharge. DME has been requested.                       Patient Goals and CMS Choice        Expected Discharge Plan and Services         Expected Discharge Date: 04/28/18                        Prior Living Arrangements/Services                       Activities of Daily Living Home Assistive Devices/Equipment: Kasandra Knudsen (specify quad or straight) ADL Screening (condition at time of admission) Patient's cognitive ability adequate to safely complete daily activities?: Yes Is the patient deaf or have difficulty hearing?: No Does the patient have difficulty seeing, even when wearing glasses/contacts?: No Does the patient have difficulty concentrating, remembering, or making decisions?: No Patient able to express need for assistance with ADLs?: Yes Does the patient have difficulty dressing or bathing?: No Independently performs ADLs?: Yes (appropriate for developmental age) Does the patient have difficulty walking or climbing stairs?: Yes Weakness of Legs: Right Weakness of Arms/Hands: None  Permission Sought/Granted                  Emotional Assessment              Admission diagnosis:  avascular necrosis right hip Patient Active Problem List   Diagnosis Date Noted  . Primary osteoarthritis of right hip 04/27/2018  . History of total hip replacement 04/27/2018  . Shortness of breath 01/02/2018  . Chalazion left upper eyelid 02/20/2017  . Vitamin D insufficiency 11/25/2013  . Healthcare maintenance 11/25/2013   . Spinal stenosis of lumbar region 07/02/2013  . Arthritis, degenerative 11/17/2012  . Baker's cyst of knee 06/05/2012  . Rectocele 04/28/2012  . Left knee DJD, degenerative meniscus tear 04/06/2012  . Numbness and tingling in hands 03/17/2012  . Periodontitis 09/23/2011  . Depression 09/23/2011  . Right knee meniscal tear 03/14/2011  . Lumbar radiculopathy 03/14/2011  . Stress incontinence of urine 10/18/2010  . NECK PAIN, CHRONIC 08/19/2008  . Normocytic anemia 05/12/2008  . SHOULDER PAIN, RIGHT 04/28/2008  . Hyperlipemia 06/18/2007  . Essential hypertension 05/20/2007   PCP:  Katherine Roan, MD Pharmacy:   [facility] [place] [address] Phone: [phone] Fax: [place], [place] [address] [place] [address] Phone: [phone] Fax: [phone]     Social Determinants of Health (SDOH) Interventions    Readmission Risk Interventions  No flowsheet data found.

## 2018-04-28 NOTE — Plan of Care (Signed)

## 2018-04-28 NOTE — Discharge Summary (Signed)
[name] MRN: [id number] DOB/AGE: 67-Oct-1952 67 y.o.  Admit date: 04/27/2018 Discharge date: 04/28/2018  Admission Diagnoses:  Principal Problem:   Primary osteoarthritis of right hip Active Problems:   History of total hip replacement   Discharge Diagnoses:  Same  Past Medical History:  Diagnosis Date  . Anemia   . Arthritis    knees hands  . Bipolar disorder ([place])   . Cervicalgia   . Depression   . Environmental allergies    cause SOB, uses inhaler for  . Environmental and seasonal allergies    uses inhaler prn  . Full dentures   . GERD (gastroesophageal reflux disease)    diet controlled - no meds  . Hyperlipidemia    diet controlled, no meds  . Hypertension   . Lumbar radiculopathy, chronic   . Right knee pain    posterior horn medial meniscal tear MRI 2013  . Spinal stenosis    getting epidural injections -last one 06/12/2015  . SVD (spontaneous vaginal delivery)    x 4    Surgeries: Procedure(s): RIGHT TOTAL HIP ARTHROPLASTY ANTERIOR APPROACH on 04/27/2018   Consultants:   Discharged Condition: Improved  Hospital Course: Olivia Ewing is an 68 y.o. female who was admitted 04/27/2018 for operative treatment ofPrimary osteoarthritis of right hip. Patient has severe unremitting pain that affects sleep, daily activities, and work/hobbies. After pre-op clearance the patient was taken to the operating room on 04/27/2018 and underwent  Procedure(s): RIGHT TOTAL HIP ARTHROPLASTY ANTERIOR APPROACH.    Patient was given perioperative antibiotics:  Anti-infectives (From admission, onward)   Start     Dose/Rate Route Frequency Ordered Stop   04/27/18 2359  ceFAZolin (ANCEF) IVPB 2g/100 mL premix     2 g 200 mL/hr over 30 Minutes Intravenous Every 6 hours 04/27/18 1754 04/28/18 0634   04/27/18 1330  ceFAZolin (ANCEF) IVPB 2g/100 mL premix  Status:  Discontinued     2 g 200 mL/hr over 30 Minutes Intravenous Every 6 hours 04/27/18 1327 04/27/18  1754   04/27/18 1115  vancomycin (VANCOCIN) powder  Status:  Discontinued       As needed 04/27/18 1148 04/27/18 1223   04/27/18 0930  ceFAZolin (ANCEF) IVPB 2g/100 mL premix     2 g 200 mL/hr over 30 Minutes Intravenous To ShortStay Surgical 04/24/18 1331 04/27/18 1030       Patient was given sequential compression devices, early ambulation, and chemoprophylaxis to prevent DVT.  Patient benefited maximally from hospital stay and there were no complications.    Recent vital signs:  Patient Vitals for the past 24 hrs:  BP Temp Temp src Pulse Resp SpO2 Height Weight  04/28/18 0356 134/70 98.9 F (37.2 C) Oral 88 18 95 % - -  04/28/18 0026 126/79 98.4 F (36.9 C) Oral 85 16 96 % - -  04/27/18 2041 112/89 98.4 F (36.9 C) Oral 83 16 99 % - -  04/27/18 1331 134/78 (!) 97.5 F (36.4 C) Oral 74 18 (!) 68 % 5\' 5"  (1.651 m) 82.6 kg  04/27/18 1300 (!) 132/98 97.6 F (36.4 C) - 68 14 95 % - -  04/27/18 1242 128/83 - - 61 (!) 29 96 % - -  04/27/18 1228 111/68 - - - 14 - - -  04/27/18 1227 - (!) 97.3 F (36.3 C) - - - - - -  04/27/18 0801 (!) 148/90 97.7 F (36.5 C) Oral 77 20 99 % 5\' 5"  (1.651 m) 82.6 kg  Recent laboratory studies:  Recent Labs    04/28/18 0515  WBC 8.1  HGB 8.4*  HCT 28.0*  PLT 250  NA 135  K 4.3  CL 101  CO2 27  BUN 12  CREATININE 1.06*  GLUCOSE 118*  CALCIUM 8.6*     Discharge Medications:   Allergies as of 04/28/2018   No Known Allergies     Medication List    STOP taking these medications   cyclobenzaprine 5 MG tablet Commonly known as:  FLEXERIL   diclofenac 75 MG EC tablet Commonly known as:  VOLTAREN   HYDROcodone-acetaminophen 5-325 MG tablet Commonly known as:  Norco   oxyCODONE-acetaminophen 5-325 MG tablet Commonly known as:  PERCOCET/ROXICET Replaced by:  oxyCODONE-acetaminophen 7.5-325 MG tablet   potassium chloride 10 MEQ tablet Commonly known as:  K-DUR     TAKE these medications   albuterol 108 (90 Base) MCG/ACT  inhaler Commonly known as:  PROVENTIL HFA;VENTOLIN HFA Inhale 1-2 puffs into the lungs every 6 (six) hours as needed for wheezing or shortness of breath.   amLODipine 5 MG tablet Commonly known as:  NORVASC Take 1 tablet (5 mg total) by mouth daily.   aspirin EC 81 MG tablet Take 1 tablet (81 mg total) by mouth 2 (two) times daily.   ferrous sulfate 325 (65 FE) MG tablet Take 1 tablet (325 mg total) by mouth daily with breakfast.   FLUoxetine 40 MG capsule Commonly known as:  PROZAC Take 40 mg by mouth 2 (two) times daily.   gabapentin 300 MG capsule Commonly known as:  NEURONTIN Take 2 tabs three times a day What changed:    how much to take  how to take this  when to take this  additional instructions   hydrOXYzine 10 MG tablet Commonly known as:  ATARAX/VISTARIL Take 10 mg by mouth 3 (three) times daily as needed for anxiety.   lisinopril-hydrochlorothiazide 20-12.5 MG tablet Commonly known as:  PRINZIDE,ZESTORETIC TAKE 1 TABLET BY MOUTH DAILY   methocarbamol 750 MG tablet Commonly known as:  ROBAXIN Take 1 tablet (750 mg total) by mouth 2 (two) times daily as needed for muscle spasms.   multivitamin with minerals Tabs tablet Take 1 tablet by mouth daily.   ondansetron 4 MG tablet Commonly known as:  ZOFRAN Take 1-2 tablets (4-8 mg total) by mouth every 8 (eight) hours as needed for nausea or vomiting.   oxyCODONE-acetaminophen 7.5-325 MG tablet Commonly known as:  Percocet Take 1-2 tablets by mouth every 6 (six) hours as needed for severe pain. Replaces:  oxyCODONE-acetaminophen 5-325 MG tablet   phenazopyridine 200 MG tablet Commonly known as:  Pyridium Take 1 tablet (200 mg total) by mouth 3 (three) times daily as needed for pain.   promethazine 25 MG tablet Commonly known as:  PHENERGAN Take 1 tablet (25 mg total) by mouth every 6 (six) hours as needed for nausea. What changed:  reasons to take this   QUEtiapine 50 MG tablet Commonly known as:   SEROQUEL Take 50 mg by mouth at bedtime.   senna-docusate 8.6-50 MG tablet Commonly known as:  Senokot S Take 1-2 tablets by mouth at bedtime as needed.   vitamin E 400 UNIT capsule Take 400 Units by mouth daily.            Durable Medical Equipment  (From admission, onward)         Start     Ordered   04/27/18 1328  DME Walker rolling  Once  Question:  Patient needs a walker to treat with the following condition  Answer:  History of hip replacement   04/27/18 1327   04/27/18 1328  DME 3 n 1  Once     04/27/18 1327   04/27/18 1328  DME Bedside commode  Once    Question:  Patient needs a bedside commode to treat with the following condition  Answer:  History of hip replacement   04/27/18 1327          Diagnostic Studies: Dg Chest 2 View  Result Date: 04/20/2018 CLINICAL DATA:  Pre operative respiratory exam. Osteoarthritis of the right hip. EXAM: CHEST - 2 VIEW COMPARISON:  02/07/2015 FINDINGS: The heart size and mediastinal contours are within normal limits. Both lungs are clear. The visualized skeletal structures are unremarkable. IMPRESSION: No active cardiopulmonary disease. Electronically Signed   By: Lorriane Shire M.D.   On: 04/20/2018 13:55   Dg Pelvis Portable  Result Date: 04/27/2018 CLINICAL DATA:  Status post right hip replacement. EXAM: PORTABLE PELVIS 1-2 VIEWS COMPARISON:  Right hip radiographs 03/20/2018 FINDINGS: Sequelae of interval right total hip arthroplasty are identified. No fracture or dislocation is identified on this single projection. Postoperative gas is noted in the soft tissues about the right hip and right thigh. IMPRESSION: Interval right total hip arthroplasty without evidence of acute complication. Electronically Signed   By: Logan Bores M.D.   On: 04/27/2018 13:00   Dg C-arm 1-60 Min  Result Date: 04/27/2018 CLINICAL DATA:  68 year old female for right hip replacement. Subsequent encounter. EXAM: DG C-ARM 61-120 MIN; OPERATIVE RIGHT HIP  WITH PELVIS Fluoroscopic time: 26 seconds. COMPARISON:  03/20/2018 plain film exam. FINDINGS: Two intraoperative C-arm views submitted for review after surgery. This reveals placement of right total hip prosthesis which appears in satisfactory position without complication noted on frontal imaging. IMPRESSION: Post total right hip replacement. Electronically Signed   By: Genia Del M.D.   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 Good relief from prior right-sided L3-L4 interlaminar injections. Symptoms have since recurred. FLUOROSCOPY TIME:  Radiation Exposure Index (as provided by the fluoroscopic device): 4.4 mGy Number of Acquired Images:  0 PROCEDURE: The procedure, risks, benefits, and alternatives were explained to the patient. Questions regarding the procedure were encouraged and answered. The patient understands and consents to the procedure. LUMBAR EPIDURAL INJECTION: An interlaminar approach was performed on the right at L3-L4. The overlying skin was cleansed and anesthetized. A 3.5 inch 20 gauge epidural needle was advanced using loss-of-resistance technique. DIAGNOSTIC EPIDURAL INJECTION: Injection of Isovue-M 200 shows a good epidural pattern with spread above and below the level of needle placement, primarily on the right. No vascular opacification is seen. THERAPEUTIC EPIDURAL INJECTION: 120 mg of Depo-Medrol mixed with 3 mL of 1% lidocaine were instilled. The procedure was well-tolerated, and the patient was discharged thirty minutes following the injection in good condition. COMPLICATIONS: None immediate. IMPRESSION: Technically successful epidural injection on the right at L3-L4 #2. Electronically Signed   By: Titus Dubin M.D.   On: 04/01/2018 12:25   Dg Hip Operative Unilat W Or W/o Pelvis Right  Result Date: 04/27/2018 CLINICAL DATA:   68 year old female for right hip replacement. Subsequent encounter. EXAM: DG C-ARM 61-120 MIN; OPERATIVE RIGHT HIP WITH PELVIS Fluoroscopic time: 26 seconds. COMPARISON:  03/20/2018 plain film exam. FINDINGS: Two intraoperative C-arm views submitted for review after surgery. This reveals placement  of right total hip prosthesis which appears in satisfactory position without complication noted on frontal imaging. IMPRESSION: Post total right hip replacement. Electronically Signed   By: Genia Del M.D.   On: 04/27/2018 13:07    Disposition: Discharge disposition: 01-Home or Self Care         Follow-up Information    Leandrew Koyanagi, MD In 2 weeks.   Specialty:  Orthopedic Surgery Why:  For suture removal, For wound re-check Contact information: [place] [phone]            Signed: Aundra Dubin 04/28/2018, 7:46 AM

## 2018-04-28 NOTE — Progress Notes (Signed)
Physical Therapy Treatment Patient Details Name: Olivia Ewing MRN: [id number] DOB: 04/17/1950 Today's Date: 04/28/2018    History of Present Illness Patient is 68 y/o female s/p R THA. PMH includes anemia, bipolar disorder, HTN, HLD, GERD, and spinal stenosis.    PT Comments    Patient is making good progress with PT.  From a mobility standpoint anticipate patient will be ready for DC home when medically ready.    Follow Up Recommendations  Follow surgeon's recommendation for DC plan and follow-up therapies     Equipment Recommendations  Rolling walker with 5" wheels;3in1 (PT)    Recommendations for Other Services       Precautions / Restrictions Precautions Precautions: Fall Restrictions Weight Bearing Restrictions: Yes RLE Weight Bearing: Weight bearing as tolerated    Mobility  Bed Mobility Overal bed mobility: Independent                Transfers Overall transfer level: Modified independent Equipment used: Rolling walker (2 wheeled)                Ambulation/Gait Ambulation/Gait assistance: Modified independent (Device/Increase time) Gait Distance (Feet): 400 Feet Assistive device: Rolling walker (2 wheeled) Gait Pattern/deviations: Step-through pattern;Decreased stride length;Decreased weight shift to right;Antalgic Gait velocity: decreased   General Gait Details: cues for safety; antalgic   Stairs             Wheelchair Mobility    Modified Rankin (Stroke Patients Only)       Balance Overall balance assessment: Needs assistance Sitting-balance support: No upper extremity supported;Feet supported Sitting balance-Leahy Scale: Good       Standing balance-Leahy Scale: Fair                              Cognition Arousal/Alertness: Awake/alert Behavior During Therapy: WFL for tasks assessed/performed Overall Cognitive Status: Impaired/Different from baseline Area of Impairment: Safety/judgement                         Safety/Judgement: Decreased awareness of safety            Exercises Total Joint Exercises Ankle Circles/Pumps: AROM;Both Quad Sets: AROM;Right Short Arc Quad: AROM;Right Heel Slides: AROM;Right Hip ABduction/ADduction: AROM;Right;Supine;Standing Marching in Standing: AROM;Right;Standing Standing Hip Extension: AROM;Right;Standing    General Comments        Pertinent Vitals/Pain Pain Assessment: No/denies pain Pain Location: R hip Pain Descriptors / Indicators: Tightness Pain Intervention(s): Premedicated before session    Home Living                      Prior Function            PT Goals (current goals can now be found in the care plan section) Acute Rehab PT Goals Patient Stated Goal: go home Progress towards PT goals: Progressing toward goals    Frequency    7X/week      PT Plan Current plan remains appropriate    Co-evaluation              AM-PAC PT "6 Clicks" Mobility   Outcome Measure  Help needed turning from your back to your side while in a flat bed without using bedrails?: None Help needed moving from lying on your back to sitting on the side of a flat bed without using bedrails?: None Help needed moving to and from a bed to a chair (including a wheelchair)?:  None Help needed standing up from a chair using your arms (e.g., wheelchair or bedside chair)?: None Help needed to walk in hospital room?: None Help needed climbing 3-5 steps with a railing? : A Little 6 Click Score: 23    End of Session Equipment Utilized During Treatment: Gait belt Activity Tolerance: Patient tolerated treatment well Patient left: with call bell/phone within reach;with family/visitor present;in bed Nurse Communication: Mobility status PT Visit Diagnosis: Other abnormalities of gait and mobility (R26.89);Muscle weakness (generalized) (M62.81);Difficulty in walking, not elsewhere classified (R26.2);Pain Pain - Right/Left: Right Pain  - part of body: Hip     Time: 7903-8333 PT Time Calculation (min) (ACUTE ONLY): 32 min  Charges:  $Gait Training: 8-22 mins $Therapeutic Exercise: 8-22 mins                     Earney Navy, PTA Acute Rehabilitation Services Pager: [phone] Office: [phone]     Darliss Cheney 04/28/2018, 5:00 PM

## 2018-05-01 ENCOUNTER — Telehealth (INDEPENDENT_AMBULATORY_CARE_PROVIDER_SITE_OTHER): Payer: Self-pay

## 2018-05-01 MED ORDER — OXYCODONE-ACETAMINOPHEN 5-325 MG PO TABS: 1.0000 | ORAL_TABLET | Freq: Three times a day (TID) | ORAL | 0 refills | Status: DC | PRN

## 2018-05-01 NOTE — Telephone Encounter (Signed)
Called patient and [facility] Thanks.

## 2018-05-01 NOTE — Telephone Encounter (Signed)
Refill #10

## 2018-05-01 NOTE — Addendum Note (Signed)
Addended by: Azucena Cecil on: 05/01/2018 07:57 PM   Modules accepted: Orders

## 2018-05-01 NOTE — Telephone Encounter (Signed)
Olivia Ewing came to our office. States patient told therapist that grandbaby bumped her arm and dumped her pills (Oxycodone) down drain. Would like to know if she can get another Rx. Would like CB. Had SU 04/27/2018- R THA

## 2018-05-06 ENCOUNTER — Telehealth (INDEPENDENT_AMBULATORY_CARE_PROVIDER_SITE_OTHER): Payer: Self-pay

## 2018-05-06 NOTE — Telephone Encounter (Signed)
Ok thanks 

## 2018-05-06 NOTE — Telephone Encounter (Signed)
FYI:  Sonia Side called and stated that patients dressing was coming off and just replaced it with dry dressing.

## 2018-05-09 ENCOUNTER — Other Ambulatory Visit: Payer: Self-pay | Admitting: Physician Assistant

## 2018-05-09 MED ORDER — OXYCODONE-ACETAMINOPHEN 5-325 MG PO TABS: 1.0000 | ORAL_TABLET | Freq: Three times a day (TID) | ORAL | 0 refills | Status: DC | PRN

## 2018-05-11 ENCOUNTER — Telehealth (INDEPENDENT_AMBULATORY_CARE_PROVIDER_SITE_OTHER): Payer: Self-pay | Admitting: Orthopaedic Surgery

## 2018-05-11 ENCOUNTER — Telehealth (INDEPENDENT_AMBULATORY_CARE_PROVIDER_SITE_OTHER): Payer: Self-pay

## 2018-05-11 NOTE — Telephone Encounter (Signed)
Patient called requesting that something stronger be sent in for her.  She stated that the Percocet does not touch her pain and that she has to take too many of them for it to work.  Patient wanted to know if Dr. Erlinda Hong or Mendel Ryder would prescribe her the 7/325 that she has received before.  [phone].  Thank you.

## 2018-05-11 NOTE — Telephone Encounter (Signed)
Called patient no answer LMOMto return call.   Do you have now or have you had in the past 7 days a fever and/or chills?   Do you have now or have you had in the past 7 days a cough?   Do you have now or have you had in the last 7 days nausea, vomiting or abdominal pain?   Have you been exposed to anyone who has tested positive for COVID-19?   Have you or anyone who lives with you traveled within the last month?

## 2018-05-11 NOTE — Telephone Encounter (Signed)
See message below. Previous message sent regarding Rx.

## 2018-05-11 NOTE — Telephone Encounter (Signed)
See message below °

## 2018-05-11 NOTE — Telephone Encounter (Signed)
Patient called stated had bad weekend and pain medication is not working and requesting a stronger Rx Oxycodone.  Please call patient to advise.

## 2018-05-12 ENCOUNTER — Other Ambulatory Visit: Payer: Self-pay

## 2018-05-12 ENCOUNTER — Encounter (INDEPENDENT_AMBULATORY_CARE_PROVIDER_SITE_OTHER): Payer: Self-pay | Admitting: Physician Assistant

## 2018-05-12 ENCOUNTER — Ambulatory Visit (INDEPENDENT_AMBULATORY_CARE_PROVIDER_SITE_OTHER): Payer: Medicare HMO | Admitting: Physician Assistant

## 2018-05-12 DIAGNOSIS — Z96641 Presence of right artificial hip joint: Secondary | ICD-10-CM

## 2018-05-12 MED ORDER — OXYCODONE-ACETAMINOPHEN 7.5-325 MG PO TABS: ORAL_TABLET | ORAL | 0 refills | Status: DC

## 2018-05-12 NOTE — Progress Notes (Signed)
Post-Op Visit Note   Patient: Olivia Ewing           Date of Birth: 01-07-1951           MRN: [id number] Visit Date: 05/12/2018 PCP: Katherine Roan, MD   Assessment & Plan:  Chief Complaint: No chief complaint on file.  Visit Diagnoses:  1. Status post total hip replacement, right     Plan: Patient is a pleasant 68 year old female presents our clinic today 15 days status post right total hip replacement, date of surgery 04/27/2018.  She has been doing fairly well since surgery.  No fevers or chills.  She has been working with home health physical therapy making great progress.  Examination of her right hip reveals a well-healing surgical incision with nylon sutures in place.  No evidence of infection.  Calf is soft and nontender.  She is neurovascular intact distally.  Today, nylon sutures were removed and Steri-Strips applied.  She will continue with home health physical therapy for another 3 weeks and then transition to outpatient physical therapy.  A prescription for outpatient physical therapy was provided today to the patient.  I have also refilled her narcotic pain medicine.  She will follow-up with [name] in 4 weeks time for repeat evaluation and x-rays.  Follow-Up Instructions: Return in about 4 weeks (around 06/09/2018).   Orders:  No orders of the defined types were placed in this encounter.  Meds ordered this encounter  Medications  . oxyCODONE-acetaminophen (PERCOCET) 7.5-325 MG tablet    Sig: Take 1 tab q6-8 hours prn pain    Dispense:  30 tablet    Refill:  0    Imaging: No new imaging  PMFS History: Patient Active Problem List   Diagnosis Date Noted  . Primary osteoarthritis of right hip 04/27/2018  . Status post total hip replacement, 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  . Shortness of breath 01/02/2018  . Chalazion left upper eyelid 02/20/2017  . Vitamin D insufficiency 11/25/2013  . Healthcare maintenance 11/25/2013  . Spinal stenosis of lumbar region 07/02/2013  .  Arthritis, degenerative 11/17/2012  . Baker's cyst of knee 06/05/2012  . Rectocele 04/28/2012  . Left knee DJD, degenerative meniscus tear 04/06/2012  . Numbness and tingling in hands 03/17/2012  . Periodontitis 09/23/2011  . Depression 09/23/2011  . Right knee meniscal tear 03/14/2011  . Lumbar radiculopathy 03/14/2011  . Stress incontinence of urine 10/18/2010  . NECK PAIN, CHRONIC 08/19/2008  . Normocytic anemia 05/12/2008  . SHOULDER PAIN, RIGHT 04/28/2008  . Hyperlipemia 06/18/2007  . Essential hypertension 05/20/2007   Past Medical History:  Diagnosis Date  . Anemia   . Arthritis    knees hands  . Bipolar disorder ([place])   . Cervicalgia   . Depression   . Environmental allergies    cause SOB, uses inhaler for  . Environmental and seasonal allergies    uses inhaler prn  . Full dentures   . GERD (gastroesophageal reflux disease)    diet controlled - no meds  . Hyperlipidemia    diet controlled, no meds  . Hypertension   . Lumbar radiculopathy, chronic   . Right knee pain    posterior horn medial meniscal tear MRI 2013  . Spinal stenosis    getting epidural injections -last one 06/12/2015  . SVD (spontaneous vaginal delivery)    x 4    Family History  Problem Relation Age of Onset  . Hypertension Mother     Past Surgical History:  Procedure Laterality  Date  . APPENDECTOMY    . Bladder tack  10/2006   cystocoele  . BREAST SURGERY Right    benign cyst  . COLONOSCOPY    . Hysterectomy other    . TOTAL HIP ARTHROPLASTY Right 04/27/2018   Procedure: RIGHT TOTAL HIP ARTHROPLASTY ANTERIOR APPROACH;  Surgeon: Leandrew Koyanagi, MD;  Location: [facility];  Service: Orthopedics;  Laterality: Right;  . TUBAL LIGATION     Social History   Occupational History  . Not on file  Tobacco Use  . Smoking status: Former Smoker    Packs/day: 0.10    Last attempt to quit: 11/26/1983    Years since quitting: 34.4  . Smokeless tobacco: Never Used  Substance and Sexual Activity  .  Alcohol use: Yes    Alcohol/week: 0.0 standard drinks    Comment: rarely  . Drug use: Not Currently    Types: Marijuana    Comment: last used 2019  . Sexual activity: Not Currently    Birth control/protection: Surgical

## 2018-05-13 ENCOUNTER — Telehealth (INDEPENDENT_AMBULATORY_CARE_PROVIDER_SITE_OTHER): Payer: Self-pay | Admitting: Physician Assistant

## 2018-05-13 NOTE — Telephone Encounter (Signed)
SEE MESSAGE--FYI

## 2018-05-13 NOTE — Telephone Encounter (Signed)
Ok.  Thanks.  I believe it is her hhpt to extend her visits

## 2018-05-13 NOTE — Telephone Encounter (Signed)
Patient needs to bring a paper from her PT for Mendel Ryder to fill out, she will bring it tomorrow and needs to wait on it so that she can give it back to her PT.  Thank you.

## 2018-05-28 ENCOUNTER — Other Ambulatory Visit: Payer: Self-pay | Admitting: Family Medicine

## 2018-06-03 ENCOUNTER — Telehealth: Payer: Self-pay | Admitting: *Deleted

## 2018-06-03 NOTE — Telephone Encounter (Signed)
4/10 4/15 Visits done by [facility] PT, visits are 2x week at this time 4/15 124/92 4/10 134/92 both at rest Pt stated to PT that she is not taking any medication at this time I called pt, she states she has been taking her meds, the PT was present and I spoke to him, he states that there were a couple of days that pt had not taken her meds until after he got to her home and also when BP has been elevated pt was having pain or just general"i dont feel good today", Pt denies any h/a, weakness, chest pain, short breath, changes in thoughts, vision, speech. Pt is encouraged to make a f/u appt in [facility] when she is stable with hip surgery, she is agreeable

## 2018-06-04 ENCOUNTER — Telehealth: Payer: Self-pay | Admitting: Family Medicine

## 2018-06-08 ENCOUNTER — Telehealth (INDEPENDENT_AMBULATORY_CARE_PROVIDER_SITE_OTHER): Payer: Self-pay | Admitting: Orthopaedic Surgery

## 2018-06-08 MED ORDER — HYDROCODONE-ACETAMINOPHEN 5-325 MG PO TABS: 1.0000 | ORAL_TABLET | Freq: Every day | ORAL | 0 refills | Status: DC | PRN

## 2018-06-08 NOTE — Telephone Encounter (Signed)
Pt called in said she's wondering if Dr.Xu could refill her prescription for hydrocodone but only send in like 10-15 pills to help her with her pain.  [phone]

## 2018-06-08 NOTE — Telephone Encounter (Signed)
Please advise. Thanks.  

## 2018-06-11 ENCOUNTER — Telehealth (INDEPENDENT_AMBULATORY_CARE_PROVIDER_SITE_OTHER): Payer: Self-pay | Admitting: Orthopaedic Surgery

## 2018-06-11 NOTE — Telephone Encounter (Signed)
Patient states she had right hip replacement 6 weeks ago and is still in pain and is having spasms.  She had requested a pain prescription several days ago and has not heard from anyone.  Please call patient and advise her what she is to do. Does not want to go through the weekend without anything.   Patient did not have a return visit to see Dr. Erlinda Hong although notes indicate that she should follow up 4 weeks and have repeat xrays from her last visit. She is now scheduled for 06-16-18

## 2018-06-11 NOTE — Telephone Encounter (Signed)
pls advise

## 2018-06-12 ENCOUNTER — Other Ambulatory Visit (INDEPENDENT_AMBULATORY_CARE_PROVIDER_SITE_OTHER): Payer: Self-pay | Admitting: Physician Assistant

## 2018-06-12 MED ORDER — OXYCODONE-ACETAMINOPHEN 5-325 MG PO TABS: 1.0000 | ORAL_TABLET | Freq: Three times a day (TID) | ORAL | 0 refills | Status: DC | PRN

## 2018-06-12 NOTE — Telephone Encounter (Signed)
Called patient to advise Rx sent to the pharm.

## 2018-06-12 NOTE — Telephone Encounter (Signed)
Just sent in to [facility]

## 2018-06-12 NOTE — Telephone Encounter (Signed)
What is [facility]??

## 2018-06-16 ENCOUNTER — Other Ambulatory Visit: Payer: Self-pay

## 2018-06-16 ENCOUNTER — Ambulatory Visit (INDEPENDENT_AMBULATORY_CARE_PROVIDER_SITE_OTHER): Payer: Medicare HMO

## 2018-06-16 ENCOUNTER — Encounter: Payer: Self-pay | Admitting: Orthopaedic Surgery

## 2018-06-16 ENCOUNTER — Ambulatory Visit (INDEPENDENT_AMBULATORY_CARE_PROVIDER_SITE_OTHER): Payer: Medicare HMO | Admitting: Orthopaedic Surgery

## 2018-06-16 DIAGNOSIS — M5441 Lumbago with sciatica, right side: Secondary | ICD-10-CM

## 2018-06-16 DIAGNOSIS — Z96641 Presence of right artificial hip joint: Secondary | ICD-10-CM

## 2018-06-16 DIAGNOSIS — M48062 Spinal stenosis, lumbar region with neurogenic claudication: Secondary | ICD-10-CM

## 2018-06-16 DIAGNOSIS — G8929 Other chronic pain: Secondary | ICD-10-CM

## 2018-06-16 DIAGNOSIS — M5442 Lumbago with sciatica, left side: Secondary | ICD-10-CM

## 2018-06-16 NOTE — Progress Notes (Signed)
Office Visit Note   Patient: Olivia Ewing           Date of Birth: Oct 20, 1950           MRN: [id number] Visit Date: 06/16/2018              Requested by: Katherine Roan, MD [place], [place] [address] PCP: Katherine Roan, MD   Assessment & Plan: Visit Diagnoses:  1. Status post total hip replacement, right   2. Chronic bilateral low back pain with bilateral sciatica   3. Spinal stenosis of lumbar region with neurogenic claudication     Plan: Impression is 6 weeks status post right total hip replacement and unrelated issue of lumbar spinal stenosis.  I reviewed her MRI of her lumbar spine which shows multilevel spinal stenosis.  We will refer her to Dr. Ernestina Patches for lumbar spine ESI.  In terms of her hip replacement she is doing very well and we plan on seeing her back in another 6 weeks for her 60-month checkup.  Questions encouraged and answered.  Follow-Up Instructions: Return in about 6 weeks (around 07/28/2018).   Orders:  Orders Placed This Encounter  Procedures  . XR HIP UNILAT W OR W/O PELVIS 2-3 VIEWS RIGHT   No orders of the defined types were placed in this encounter.     Procedures: No procedures performed   Clinical Data: No additional findings.   Subjective: Chief Complaint  Patient presents with  . Right Hip - Pain    Olivia Ewing is 50 days status post right total hip replacement from which she is doing very well.  She has a little bit of home health physical therapy left.  She is ambulating really well.  I watched the video of her at home dancing and she is doing so well from this.  She is very happy.  She is also complaining of low back pain from her congenital and acquired spinal stenosis for which she has received ESI's at [facility] before.  She would like to see Dr. Ernestina Patches for this as her back is starting to bother her again.  She has no complaints regarding her hip replacement.   Review of Systems   Objective: Vital Signs:  There were no vitals taken for this visit.  Physical Exam  Ortho Exam Right hip exam shows a fully healed surgical scar.  She has full painless range of motion of the hip.  Leg lengths are equal. Specialty Comments:  No specialty comments available.  Imaging: Xr Hip Unilat W Or W/o Pelvis 2-3 Views Right  Result Date: 06/16/2018 Stable right total hip replacement without complication    PMFS History: Patient Active Problem List   Diagnosis Date Noted  . Primary osteoarthritis of right hip 04/27/2018  . Status post total hip replacement, 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  . Shortness of breath 01/02/2018  . Chalazion left upper eyelid 02/20/2017  . Vitamin D insufficiency 11/25/2013  . Healthcare maintenance 11/25/2013  . Spinal stenosis of lumbar region 07/02/2013  . Arthritis, degenerative 11/17/2012  . Baker's cyst of knee 06/05/2012  . Rectocele 04/28/2012  . Left knee DJD, degenerative meniscus tear 04/06/2012  . Numbness and tingling in hands 03/17/2012  . Periodontitis 09/23/2011  . Depression 09/23/2011  . Right knee meniscal tear 03/14/2011  . Lumbar radiculopathy 03/14/2011  . Stress incontinence of urine 10/18/2010  . NECK PAIN, CHRONIC 08/19/2008  . Normocytic anemia 05/12/2008  . SHOULDER PAIN, RIGHT 04/28/2008  . Hyperlipemia  06/18/2007  . Essential hypertension 05/20/2007   Past Medical History:  Diagnosis Date  . Anemia   . Arthritis    knees hands  . Bipolar disorder ([place])   . Cervicalgia   . Depression   . Environmental allergies    cause SOB, uses inhaler for  . Environmental and seasonal allergies    uses inhaler prn  . Full dentures   . GERD (gastroesophageal reflux disease)    diet controlled - no meds  . Hyperlipidemia    diet controlled, no meds  . Hypertension   . Lumbar radiculopathy, chronic   . Right knee pain    posterior horn medial meniscal tear MRI 2013  . Spinal stenosis    getting epidural injections -last one 06/12/2015  . SVD  (spontaneous vaginal delivery)    x 4    Family History  Problem Relation Age of Onset  . Hypertension Mother     Past Surgical History:  Procedure Laterality Date  . APPENDECTOMY    . Bladder tack  10/2006   cystocoele  . BREAST SURGERY Right    benign cyst  . COLONOSCOPY    . Hysterectomy other    . TOTAL HIP ARTHROPLASTY Right 04/27/2018   Procedure: RIGHT TOTAL HIP ARTHROPLASTY ANTERIOR APPROACH;  Surgeon: Leandrew Koyanagi, MD;  Location: [facility];  Service: Orthopedics;  Laterality: Right;  . TUBAL LIGATION     Social History   Occupational History  . Not on file  Tobacco Use  . Smoking status: Former Smoker    Packs/day: 0.10    Last attempt to quit: 11/26/1983    Years since quitting: 34.5  . Smokeless tobacco: Never Used  Substance and Sexual Activity  . Alcohol use: Yes    Alcohol/week: 0.0 standard drinks    Comment: rarely  . Drug use: Not Currently    Types: Marijuana    Comment: last used 2019  . Sexual activity: Not Currently    Birth control/protection: Surgical

## 2018-06-25 ENCOUNTER — Telehealth: Payer: Self-pay

## 2018-06-25 NOTE — Telephone Encounter (Signed)
Left message for patient to see if she went for Uro-Gynecology appointment. The patient had appointment with DR. Lavonia Drafts but no showed the office left a message for the the patient to call the[facility]ine patient also no showed for [facility]. [address] [place] C5/14/202010:32 AM

## 2018-06-29 ENCOUNTER — Other Ambulatory Visit: Payer: Self-pay

## 2018-06-29 ENCOUNTER — Telehealth: Payer: Self-pay | Admitting: Physical Medicine and Rehabilitation

## 2018-06-29 DIAGNOSIS — M48062 Spinal stenosis, lumbar region with neurogenic claudication: Secondary | ICD-10-CM

## 2018-06-29 DIAGNOSIS — G8929 Other chronic pain: Secondary | ICD-10-CM

## 2018-06-29 NOTE — Telephone Encounter (Signed)
Referral made. Thank you.

## 2018-07-07 ENCOUNTER — Telehealth: Payer: Self-pay | Admitting: Physical Medicine and Rehabilitation

## 2018-07-07 NOTE — Telephone Encounter (Signed)
Will you call in tramadol 50 mg 1 tab q 6 hrs prn pain #30 as long as no hx of seizures?

## 2018-07-07 NOTE — Telephone Encounter (Signed)
Please advise 

## 2018-07-07 NOTE — Telephone Encounter (Signed)
Rx's were from [place] [facility] and [facility], see what they think

## 2018-07-08 ENCOUNTER — Telehealth: Payer: Self-pay | Admitting: Orthopaedic Surgery

## 2018-07-08 NOTE — Telephone Encounter (Signed)
Called patient to see if she wanted Tramadol no answer LMOM to return call. If she does want Tramadol and she has not had no history of seizures we need to know what pharm she uses so we can call this in.

## 2018-07-09 ENCOUNTER — Telehealth: Payer: Self-pay

## 2018-07-09 MED ORDER — OXYCODONE-ACETAMINOPHEN 5-325 MG PO TABS: 1.0000 | ORAL_TABLET | Freq: Every day | ORAL | 0 refills | Status: DC | PRN

## 2018-07-09 NOTE — Telephone Encounter (Signed)
Called in very small rx for oxy.  Needs to use sparingly

## 2018-07-09 NOTE — Telephone Encounter (Signed)
See other message

## 2018-07-09 NOTE — Telephone Encounter (Signed)
Sherre Scarlet, RT  You 17 hours ago (4:59 PM)    Patient called back to Dr. Romona Curls area regarding a prescription. She states that she cannot take tramadol because it causes nightmares and sweating. She states that the oxycodone (she aid 7.5) works best. She states that with hydrocodone she has to take 3 to get the same effect.

## 2018-07-09 NOTE — Telephone Encounter (Signed)
See message.

## 2018-07-09 NOTE — Telephone Encounter (Signed)
Patient called back stating that she called the pharmacy and was advised that her Rx was ready.  Stated to disregard previous message in chart.  Thank you.

## 2018-07-15 ENCOUNTER — Telehealth: Payer: Self-pay | Admitting: *Deleted

## 2018-07-15 NOTE — Telephone Encounter (Signed)
Pt called stating that we had prescribed an ointment that you apply with a Q tip into each nostril twice a day around the time of her surgery back in March. She is having issues with a stuffy,bloody,scabbed nose again and is wondering if we can send in another prescription for her. She feels like it really helped with these issues before. Pt is unaware of the name of the medication. Pharmacy is [facility] on [address] in [place]. Pt is requesting a return call from [place].

## 2018-07-15 NOTE — Telephone Encounter (Signed)
This is not for what she has.  She should contact her pcp to see if there is something that they would recommend for her issue

## 2018-07-15 NOTE — Telephone Encounter (Signed)
See message below °

## 2018-07-16 NOTE — Telephone Encounter (Signed)
Called pt and let her know to call PCP

## 2018-07-27 ENCOUNTER — Encounter: Payer: Self-pay | Admitting: Physical Medicine and Rehabilitation

## 2018-07-27 ENCOUNTER — Ambulatory Visit: Payer: Self-pay

## 2018-07-27 ENCOUNTER — Other Ambulatory Visit: Payer: Self-pay

## 2018-07-27 ENCOUNTER — Ambulatory Visit (INDEPENDENT_AMBULATORY_CARE_PROVIDER_SITE_OTHER): Payer: Medicare HMO | Admitting: Physical Medicine and Rehabilitation

## 2018-07-27 ENCOUNTER — Telehealth: Payer: Self-pay | Admitting: *Deleted

## 2018-07-27 VITALS — BP 151/104 | HR 87

## 2018-07-27 DIAGNOSIS — M5416 Radiculopathy, lumbar region: Secondary | ICD-10-CM | POA: Diagnosis not present

## 2018-07-27 MED ORDER — BETAMETHASONE SOD PHOS & ACET 6 (3-3) MG/ML IJ SUSP
12.0000 mg | Freq: Once | INTRAMUSCULAR | Status: AC
  Administered 2018-07-27: 12 mg

## 2018-07-27 NOTE — Telephone Encounter (Signed)
Patient states she needs hydrocodone to be refilled, patient states she had her shot today but she knows it will take a bit for the shot to work. Please advise [phone]

## 2018-07-27 NOTE — Telephone Encounter (Signed)
Please advise on medication refill.  

## 2018-07-27 NOTE — Progress Notes (Signed)
 .  Numeric Pain Rating Scale and Functional Assessment Average Pain 8   In the last MONTH (on 0-10 scale) has pain interfered with the following?  1. General activity like being  able to carry out your everyday physical activities such as walking, climbing stairs, carrying groceries, or moving a chair?  Rating(7)   +Driver, -BT, -Dye Allergies.  

## 2018-07-28 ENCOUNTER — Other Ambulatory Visit: Payer: Self-pay | Admitting: Physician Assistant

## 2018-07-28 ENCOUNTER — Ambulatory Visit: Payer: Medicaid Other | Admitting: Orthopaedic Surgery

## 2018-07-28 MED ORDER — HYDROCODONE-ACETAMINOPHEN 5-325 MG PO TABS: 1.0000 | ORAL_TABLET | Freq: Every day | ORAL | 0 refills | Status: DC | PRN

## 2018-07-28 NOTE — Telephone Encounter (Signed)
I left voicemail for patient advising. 

## 2018-07-28 NOTE — Telephone Encounter (Signed)
Just sent in to pharmacy.  Please use sparingly

## 2018-07-29 ENCOUNTER — Other Ambulatory Visit: Payer: Self-pay | Admitting: Internal Medicine

## 2018-07-29 NOTE — Telephone Encounter (Signed)
refilled 

## 2018-08-11 ENCOUNTER — Ambulatory Visit (INDEPENDENT_AMBULATORY_CARE_PROVIDER_SITE_OTHER): Payer: Medicare HMO | Admitting: Orthopaedic Surgery

## 2018-08-11 ENCOUNTER — Other Ambulatory Visit: Payer: Self-pay

## 2018-08-11 DIAGNOSIS — Z96641 Presence of right artificial hip joint: Secondary | ICD-10-CM | POA: Diagnosis not present

## 2018-08-11 NOTE — Progress Notes (Signed)
Post-Op Visit Note   Patient: Olivia Ewing           Date of Birth: 1950/09/04           MRN: [id number] Visit Date: 08/11/2018 PCP: Katherine Roan, MD   Assessment & Plan:  Chief Complaint:  Chief Complaint  Patient presents with  . Lower Back - Follow-up   Visit Diagnoses:  1. Status post total hip replacement, right     Plan: [place] is 3 months status post right total hip replacement and 2-week status post lumbar ESI injection.  She is doing great.  She is very happy she states that she is no longer in pain.  Her surgical scar is fully healed.  Good range of motion of the hip without pain.  From my standpoint she is doing really well and she is very happy.  We will see her back in 3 months for her 31-month visit.  She will need stand up AP pelvis x-rays at that time.  Follow-Up Instructions: Return in about 3 months (around 11/11/2018).   Orders:  No orders of the defined types were placed in this encounter.  No orders of the defined types were placed in this encounter.   Imaging: No results found.  PMFS History: Patient Active Problem List   Diagnosis Date Noted  . Primary osteoarthritis of right hip 04/27/2018  . Status post total hip replacement, 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  . Shortness of breath 01/02/2018  . Chalazion left upper eyelid 02/20/2017  . Vitamin D insufficiency 11/25/2013  . Healthcare maintenance 11/25/2013  . Spinal stenosis of lumbar region 07/02/2013  . Arthritis, degenerative 11/17/2012  . Baker's cyst of knee 06/05/2012  . Rectocele 04/28/2012  . Left knee DJD, degenerative meniscus tear 04/06/2012  . Numbness and tingling in hands 03/17/2012  . Periodontitis 09/23/2011  . Depression 09/23/2011  . Right knee meniscal tear 03/14/2011  . Lumbar radiculopathy 03/14/2011  . Stress incontinence of urine 10/18/2010  . NECK PAIN, CHRONIC 08/19/2008  . Normocytic anemia 05/12/2008  . SHOULDER PAIN, RIGHT 04/28/2008  . Hyperlipemia 06/18/2007  .  Essential hypertension 05/20/2007   Past Medical History:  Diagnosis Date  . Anemia   . Arthritis    knees hands  . Bipolar disorder ([place])   . Cervicalgia   . Depression   . Environmental allergies    cause SOB, uses inhaler for  . Environmental and seasonal allergies    uses inhaler prn  . Full dentures   . GERD (gastroesophageal reflux disease)    diet controlled - no meds  . Hyperlipidemia    diet controlled, no meds  . Hypertension   . Lumbar radiculopathy, chronic   . Right knee pain    posterior horn medial meniscal tear MRI 2013  . Spinal stenosis    getting epidural injections -last one 06/12/2015  . SVD (spontaneous vaginal delivery)    x 4    Family History  Problem Relation Age of Onset  . Hypertension Mother     Past Surgical History:  Procedure Laterality Date  . APPENDECTOMY    . Bladder tack  10/2006   cystocoele  . BREAST SURGERY Right    benign cyst  . COLONOSCOPY    . Hysterectomy other    . TOTAL HIP ARTHROPLASTY Right 04/27/2018   Procedure: RIGHT TOTAL HIP ARTHROPLASTY ANTERIOR APPROACH;  Surgeon: Leandrew Koyanagi, MD;  Location: [facility];  Service: Orthopedics;  Laterality: Right;  . TUBAL LIGATION  Social History   Occupational History  . Not on file  Tobacco Use  . Smoking status: Former Smoker    Packs/day: 0.10    Quit date: 11/26/1983    Years since quitting: 34.7  . Smokeless tobacco: Never Used  Substance and Sexual Activity  . Alcohol use: Yes    Alcohol/week: 0.0 standard drinks    Comment: rarely  . Drug use: Not Currently    Types: Marijuana    Comment: last used 2019  . Sexual activity: Not Currently    Birth control/protection: Surgical

## 2018-08-13 ENCOUNTER — Encounter: Payer: Self-pay | Admitting: Internal Medicine

## 2018-08-13 ENCOUNTER — Ambulatory Visit: Payer: Medicaid Other

## 2018-09-11 NOTE — Procedures (Signed)
Lumbar Epidural Steroid Injection - Interlaminar Approach with Fluoroscopic Guidance  Patient: Olivia Ewing      Date of Birth: Jan 27, 1951 MRN: [id number] PCP: Katherine Roan, MD      Visit Date: 07/27/2018   Universal Protocol:     Consent Given By: the patient  Position: PRONE  Additional Comments: Vital signs were monitored before and after the procedure. Patient was prepped and draped in the usual sterile fashion. The correct patient, procedure, and site was verified.   Injection Procedure Details:  Procedure Site One Meds Administered:  Meds ordered this encounter  Medications  . betamethasone acetate-betamethasone sodium phosphate (CELESTONE) injection 12 mg     Laterality: Right  Location/Site:  L3-L4  Needle size: 20 G  Needle type: Tuohy  Needle Placement: Paramedian epidural  Findings:   -Comments: Excellent flow of contrast into the epidural space.  Procedure Details: Using a paramedian approach from the side mentioned above, the region overlying the inferior lamina was localized under fluoroscopic visualization and the soft tissues overlying this structure were infiltrated with 4 ml. of 1% Lidocaine without Epinephrine. The Tuohy needle was inserted into the epidural space using a paramedian approach.   The epidural space was localized using loss of resistance along with lateral and bi-planar fluoroscopic views.  After negative aspirate for air, blood, and CSF, a 2 ml. volume of Isovue-250 was injected into the epidural space and the flow of contrast was observed. Radiographs were obtained for documentation purposes.    The injectate was administered into the level noted above.   Additional Comments:  The patient tolerated the procedure well Dressing: 2 x 2 sterile gauze and Band-Aid    Post-procedure details: Patient was observed during the procedure. Post-procedure instructions were reviewed.  Patient left the clinic in stable  condition.

## 2018-09-11 NOTE — Progress Notes (Signed)
Olivia Ewing - 68 y.o. female MRN [id number]  Date of birth: [birth year]  Office Visit Note: Visit Date: 07/27/2018 PCP: Katherine Roan, MD Referred by: Katherine Roan, MD  Subjective: Chief Complaint  Patient presents with  . Lower Back - Pain  . Right Leg - Pain   HPI:  Olivia Ewing is a 68 y.o. female who comes in today For planned interlaminar epidural steroid injection on the right of 3-4 as requested by Dr. Eduard Roux.  Patient has pretty significant stenosis multifactorial L4-5 as well.  Depending on relief would look at transforaminal approach at L4.  Patient may need surgical referral for decompression at L4-5.  ROS Otherwise per HPI.  Assessment & Plan: Visit Diagnoses:  1. Lumbar radiculopathy     Plan: No additional findings.   Meds & Orders:  Meds ordered this encounter  Medications  . betamethasone acetate-betamethasone sodium phosphate (CELESTONE) injection 12 mg    Orders Placed This Encounter  Procedures  . XR C-ARM NO REPORT  . Epidural Steroid injection    Follow-up: No follow-ups on file.   Procedures: No procedures performed  Lumbar Epidural Steroid Injection - Interlaminar Approach with Fluoroscopic Guidance  Patient: Olivia Ewing      Date of Birth: [birth year] MRN: [id number] PCP: Katherine Roan, MD      Visit Date: 07/27/2018   Universal Protocol:     Consent Given By: the patient  Position: PRONE  Additional Comments: Vital signs were monitored before and after the procedure. Patient was prepped and draped in the usual sterile fashion. The correct patient, procedure, and site was verified.   Injection Procedure Details:  Procedure Site One Meds Administered:  Meds ordered this encounter  Medications  . betamethasone acetate-betamethasone sodium phosphate (CELESTONE) injection 12 mg     Laterality: Right  Location/Site:  L3-L4  Needle size: 20 G  Needle type: Tuohy  Needle Placement: Paramedian  epidural  Findings:   -Comments: Excellent flow of contrast into the epidural space.  Procedure Details: Using a paramedian approach from the side mentioned above, the region overlying the inferior lamina was localized under fluoroscopic visualization and the soft tissues overlying this structure were infiltrated with 4 ml. of 1% Lidocaine without Epinephrine. The Tuohy needle was inserted into the epidural space using a paramedian approach.   The epidural space was localized using loss of resistance along with lateral and bi-planar fluoroscopic views.  After negative aspirate for air, blood, and CSF, a 2 ml. volume of Isovue-250 was injected into the epidural space and the flow of contrast was observed. Radiographs were obtained for documentation purposes.    The injectate was administered into the level noted above.   Additional Comments:  The patient tolerated the procedure well Dressing: 2 x 2 sterile gauze and Band-Aid    Post-procedure details: Patient was observed during the procedure. Post-procedure instructions were reviewed.  Patient left the clinic in stable condition.   Clinical History: MRI LUMBAR SPINE WITHOUT CONTRAST  TECHNIQUE: Multiplanar, multisequence MR imaging of the lumbar spine was performed. No intravenous contrast was administered.  COMPARISON:  MRI lumbar spine 05/20/2015  FINDINGS: Segmentation: 5 non rib-bearing lumbar type vertebral bodies are present. The lowest fully formed vertebral body is labeled L5.  Alignment: Slight retrolisthesis at L3-4 and L4-5 is stable. Levoconvex curvature is centered at L4.  Vertebrae: Chronic endplate marrow changes are evident on the right at L4-5 and to lesser extent at L3-4. An inferior endplate Schmorl's  node at L2 has progressed. Marrow signal and vertebral body heights are otherwise normal.  Conus medullaris and cauda equina: Conus extends to the L1-2 level. Conus and cauda equina appear normal.   Paraspinal and other soft tissues: A prominent left renal cyst is only partially imaged. No other focal lesions are present. No significant adenopathy is present.  Disc levels:  L1-2: A broad-based disc protrusion and moderate bilateral facet hypertrophy is stable. Mild left subarticular narrowing is stable. The foramina are patent.  L2-3: A broad-based disc protrusion is present. Moderate facet hypertrophy is present bilaterally. Mild subarticular and foraminal narrowing is present bilaterally, left greater than right.  L3-4: A broad-based disc protrusion is present. Moderate right and mild left subarticular narrowing is again seen. Moderate foraminal stenosis bilaterally is stable.  L4-5: Broad-based disc protrusion is again seen. Moderate to severe central canal stenosis is unchanged. Moderate to severe foraminal stenosis is stable is well, slightly worse on the right.  L5-S1: A rightward disc protrusion is again seen. Mild right subarticular narrowing is evident. The foramina are patent bilaterally.  IMPRESSION: 1. 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   Electronically Signed   By: San Morelle M.D.   On: 05/19/2017 13:39     Objective:  VS:  HT:    WT:   BMI:     BP:(!) 151/104  HR:87bpm  TEMP: ( )  RESP:  Physical Exam  Ortho Exam Imaging: No results found.

## 2018-09-14 ENCOUNTER — Other Ambulatory Visit: Payer: Self-pay

## 2018-09-14 ENCOUNTER — Ambulatory Visit (INDEPENDENT_AMBULATORY_CARE_PROVIDER_SITE_OTHER): Payer: Self-pay | Admitting: Family Medicine

## 2018-09-14 VITALS — BP 130/80 | Ht 65.0 in | Wt 160.0 lb

## 2018-09-14 DIAGNOSIS — M25511 Pain in right shoulder: Secondary | ICD-10-CM

## 2018-09-14 MED ORDER — KETOROLAC TROMETHAMINE 60 MG/2ML IM SOLN
60.0000 mg | Freq: Once | INTRAMUSCULAR | Status: AC
  Administered 2018-09-14: 15:00:00 60 mg via INTRAMUSCULAR

## 2018-09-14 MED ORDER — CYCLOBENZAPRINE HCL 10 MG PO TABS: 10.0000 mg | ORAL_TABLET | Freq: Three times a day (TID) | ORAL | 1 refills | Status: DC | PRN

## 2018-09-14 NOTE — Patient Instructions (Signed)
You were given a toradol injection today. Get x-rays of your shoulder and scapula at your convenience - we will call you with the results and next steps. Take flexeril as needed for spasms. Tylenol 500mg  1-2 tabs three times a day as needed for pain. Icing 15 minutes at a time 3-4 times a day.

## 2018-09-14 NOTE — Progress Notes (Signed)
PCP: Katherine Roan, MD  Subjective:   HPI: Patient is a 68 y.o. female here for right shoulder pain after trauma (pieces of ceilling came off and fell on her right shoulder when she was walking in the mall on Thursday (5 days ago).)  Since then she has had constant sharp pain around her right shoulder and scapula. That gets worse when she moves her arm or when she sleeps on right side. She has taken some Tylenol for the pain and used ice with mild relief. She has not been seen in urgent care of ED for this issue and came to clinic today for further evaluation.  No skin changes.  Mild swelling.  Past Medical History:  Diagnosis Date  . Anemia   . Arthritis    knees hands  . Bipolar disorder ([place])   . Cervicalgia   . Depression   . Environmental allergies    cause SOB, uses inhaler for  . Environmental and seasonal allergies    uses inhaler prn  . Full dentures   . GERD (gastroesophageal reflux disease)    diet controlled - no meds  . Hyperlipidemia    diet controlled, no meds  . Hypertension   . Lumbar radiculopathy, chronic   . Right knee pain    posterior horn medial meniscal tear MRI 2013  . Spinal stenosis    getting epidural injections -last one 06/12/2015  . SVD (spontaneous vaginal delivery)    x 4    Current Outpatient Medications on File Prior to Visit  Medication Sig Dispense Refill  . albuterol (PROVENTIL HFA;VENTOLIN HFA) 108 (90 Base) MCG/ACT inhaler Inhale 1-2 puffs into the lungs every 6 (six) hours as needed for wheezing or shortness of breath. 1 Inhaler 11  . amLODipine (NORVASC) 5 MG tablet TAKE 1 TABLET (5 MG TOTAL) BY MOUTH DAILY. 90 tablet 1  . aspirin EC 81 MG tablet Take 1 tablet (81 mg total) by mouth 2 (two) times daily. 84 tablet 0  . ferrous sulfate 325 (65 FE) MG tablet Take 1 tablet (325 mg total) by mouth daily with breakfast. 90 tablet 0  . FLUoxetine (PROZAC) 40 MG capsule Take 40 mg by mouth 2 (two) times daily.     Marland Kitchen gabapentin  (NEURONTIN) 300 MG capsule Take 2 tabs three times a day (Patient taking differently: Take 300 mg by mouth 3 (three) times daily. ) 180 capsule 3  . HYDROcodone-acetaminophen (NORCO) 5-325 MG tablet Take 1-2 tablets by mouth daily as needed for moderate pain. 20 tablet 0  . hydrOXYzine (ATARAX/VISTARIL) 10 MG tablet Take 10 mg by mouth 3 (three) times daily as needed for anxiety.    Marland Kitchen lisinopril-hydrochlorothiazide (PRINZIDE,ZESTORETIC) 20-12.5 MG tablet TAKE 1 TABLET BY MOUTH DAILY 90 tablet 2  . methocarbamol (ROBAXIN) 750 MG tablet Take 1 tablet (750 mg total) by mouth 2 (two) times daily as needed for muscle spasms. 60 tablet 0  . Multiple Vitamin (MULTIVITAMIN WITH MINERALS) TABS tablet Take 1 tablet by mouth daily.    . ondansetron (ZOFRAN) 4 MG tablet Take 1-2 tablets (4-8 mg total) by mouth every 8 (eight) hours as needed for nausea or vomiting. 40 tablet 0  . oxyCODONE-acetaminophen (PERCOCET) 5-325 MG tablet Take 1-2 tablets by mouth daily as needed for severe pain. 10 tablet 0  . phenazopyridine (PYRIDIUM) 200 MG tablet Take 1 tablet (200 mg total) by mouth 3 (three) times daily as needed for pain. 20 tablet 1  . promethazine (PHENERGAN) 25 MG tablet  Take 1 tablet (25 mg total) by mouth every 6 (six) hours as needed for nausea. 30 tablet 1  . QUEtiapine (SEROQUEL) 50 MG tablet Take 50 mg by mouth at bedtime.    . senna-docusate (SENOKOT S) 8.6-50 MG tablet Take 1-2 tablets by mouth at bedtime as needed. 30 tablet 1  . vitamin E 400 UNIT capsule Take 400 Units by mouth daily.     No current facility-administered medications on file prior to visit.     Past Surgical History:  Procedure Laterality Date  . APPENDECTOMY    . Bladder tack  10/2006   cystocoele  . BREAST SURGERY Right    benign cyst  . COLONOSCOPY    . Hysterectomy other    . TOTAL HIP ARTHROPLASTY Right 04/27/2018   Procedure: RIGHT TOTAL HIP ARTHROPLASTY ANTERIOR APPROACH;  Surgeon: Leandrew Koyanagi, MD;  Location: [facility];   Service: Orthopedics;  Laterality: Right;  . TUBAL LIGATION      No Known Allergies  Social History   Socioeconomic History  . Marital status: Divorced    Spouse name: Not on file  . Number of children: 4  . Years of education: Not on file  . Highest education level: Not on file  Occupational History  . Not on file  Social Needs  . Financial resource strain: Not on file  . Food insecurity    Worry: Never true    Inability: Never true  . Transportation needs    Medical: Yes    Non-medical: Yes  Tobacco Use  . Smoking status: Former Smoker    Packs/day: 0.10    Quit date: 11/26/1983    Years since quitting: 34.8  . Smokeless tobacco: Never Used  Substance and Sexual Activity  . Alcohol use: Yes    Alcohol/week: 0.0 standard drinks    Comment: rarely  . Drug use: Not Currently    Types: Marijuana    Comment: last used 2019  . Sexual activity: Not Currently    Birth control/protection: Surgical  Lifestyle  . Physical activity    Days per week: Not on file    Minutes per session: Not on file  . Stress: Not on file  Relationships  . Social Herbalist on phone: Not on file    Gets together: Not on file    Attends religious service: Not on file    Active member of club or organization: Not on file    Attends meetings of clubs or organizations: Not on file    Relationship status: Not on file  . Intimate partner violence    Fear of current or ex partner: Not on file    Emotionally abused: Not on file    Physically abused: Not on file    Forced sexual activity: Not on file  Other Topics Concern  . Not on file  Social History Narrative   Single. 9 siblings. 1 brother with HIV, 1 sister with hypertension. 4 children, 1 daughter with HTN and depression, 1 son with HTN.   [phone]- shelter's number.    Former smoker, no alcohol or drug use.    Family History  Problem Relation Age of Onset  . Hypertension Mother     BP 130/80   Ht 5\' 5"  (1.651 m)    Wt 160 lb (72.6 kg)   BMI 26.63 kg/m   Review of Systems: See HPI above.     Objective:  Physical Exam:  Gen: NAD, comfortable in exam  room  Musculoskeletal exam: No swelling, ecchymoses.  No gross deformity. TTP trapezius, scapular spine, scapular body, rhomboids.  No clavicle, other tenderness. Full IR and ER.  Flexion and abduction limited to 90 degrees, painful. Negative Hawkins, Neers. Negative Yergasons. Strength 5/5 with empty can and resisted internal/external rotation.  Pain empty can but posterior shoulder. NV intact distally.  Left shoulder: No deformity. FROM with 5/5 strength. No tenderness to palpation. NVI distally.   Assessment & Plan:  1. Acute right shoulder pain 2/2 trauma:  Will go ahead with radiographs of right shoulder and scapula to rule out fracture.  IM toradol with flexeril as needed.  Icing as needed.  Tylenol as needed.  Will call with results and next steps.

## 2018-09-15 ENCOUNTER — Encounter: Payer: Self-pay | Admitting: Family Medicine

## 2018-09-15 ENCOUNTER — Telehealth: Payer: Self-pay | Admitting: Physical Medicine and Rehabilitation

## 2018-09-15 ENCOUNTER — Ambulatory Visit
Admission: RE | Admit: 2018-09-15 | Discharge: 2018-09-15 | Disposition: A | Discharge status: Home / Self Care | Payer: Medicare HMO | Source: Ambulatory Visit | Attending: Family Medicine | Admitting: Family Medicine

## 2018-09-15 ENCOUNTER — Ambulatory Visit (INDEPENDENT_AMBULATORY_CARE_PROVIDER_SITE_OTHER): Payer: Medicare HMO | Admitting: Orthopaedic Surgery

## 2018-09-15 ENCOUNTER — Encounter: Payer: Self-pay | Admitting: Orthopaedic Surgery

## 2018-09-15 ENCOUNTER — Ambulatory Visit (INDEPENDENT_AMBULATORY_CARE_PROVIDER_SITE_OTHER): Payer: Medicare HMO

## 2018-09-15 DIAGNOSIS — Z96641 Presence of right artificial hip joint: Secondary | ICD-10-CM

## 2018-09-15 DIAGNOSIS — M25551 Pain in right hip: Secondary | ICD-10-CM

## 2018-09-15 DIAGNOSIS — M25511 Pain in right shoulder: Secondary | ICD-10-CM

## 2018-09-15 DIAGNOSIS — M5416 Radiculopathy, lumbar region: Secondary | ICD-10-CM

## 2018-09-15 MED ORDER — PREDNISONE 10 MG (21) PO TBPK: ORAL_TABLET | ORAL | 0 refills | Status: DC

## 2018-09-15 MED ORDER — METHOCARBAMOL 500 MG PO TABS: 500.0000 mg | ORAL_TABLET | Freq: Two times a day (BID) | ORAL | 0 refills | Status: DC | PRN

## 2018-09-15 NOTE — Telephone Encounter (Signed)
She has severe stenosis, ok L4 TF esi, if no help after that then referral to [facility] or whoever Dr. Erlinda Hong recommends. If trauma from tiles then see Dr. Eileen Stanford for follow up. I can send in Baclofen for muscle relaxer but no narcotics from me.

## 2018-09-15 NOTE — Telephone Encounter (Signed)
Pt came into the office requesting an injection and a prescription for pain until she has the injection done due to some ceiling tiles falling on top of her.  [phone]

## 2018-09-15 NOTE — Progress Notes (Signed)
Office Visit Note   Patient: Olivia Ewing           Date of Birth: December 14, 1950           MRN: [id number] Visit Date: 09/15/2018              Requested by: Katherine Roan, MD [place],  [place] [address] PCP: Katherine Roan, MD   Assessment & Plan: Visit Diagnoses:  1. Pain in right hip   2. Status post total hip replacement, right   3. Lumbar radiculopathy     Plan: Impression is right lumbar strain and right shoulder contusion.  We will start the patient on a Sterapred taper muscle relaxer.  She already has a scheduled follow-up in 2 months for her right total hip replacement, so we will reassess her shoulder and lumbar strain at that appointment.  Call with concerns or questions the meantime.  Follow-Up Instructions: Return for f/u for regularly scheduled appointment for her right total hip replacement.   Orders:  Orders Placed This Encounter  Procedures  . XR Lumbar Spine 2-3 Views  . XR HIP UNILAT W OR W/O PELVIS 2-3 VIEWS RIGHT   Meds ordered this encounter  Medications  . predniSONE (STERAPRED UNI-PAK 21 TAB) 10 MG (21) TBPK tablet    Sig: Take as directed    Dispense:  21 tablet    Refill:  0  . methocarbamol (ROBAXIN) 500 MG tablet    Sig: Take 1 tablet (500 mg total) by mouth 2 (two) times daily as needed for muscle spasms.    Dispense:  20 tablet    Refill:  0      Procedures: No procedures performed   Clinical Data: No additional findings.   Subjective: Chief Complaint  Patient presents with  . Lower Back - Pain    HPI patient is a pleasant 68 year old female who presents our clinic today with an injury to her right hip and shoulder.  She is approximately 4 months status post right anterior total hip replacement and 6 weeks status post lumbar ESI injection with Dr. Ernestina Patches.  She was doing excellent at her follow-up appointment 1 month ago.  This past Friday, 09/11/2018, she was walking in the mall on the first floor when a ceiling  tile from the third floor fell with part of it landing on her right lower back/hip and part of it landing on her right shoulder.  She has had pain since.  She was seen by the sports medicine docs at [facility] where she was sent to [facility] imaging for x-rays of her right shoulder.  These were negative.  The pain she has is more of a soreness to the top of her shoulder.  Limited range of motion secondary to the pain.  In regards to her right lower back and hip, she is having pain to the right lower back and radiating down the posterior leg.  No pain into the groin or top of the thigh.  Pain is worse with ambulation.  No bowel or bladder change and no saddle paresthesias.  Review of Systems as detailed in HPI.  All others reviewed and are negative.   Objective: Vital Signs: There were no vitals taken for this visit.  Physical Exam well-developed and well-nourished female no acute distress.  Alert and oriented x3.  Ortho Exam examination of her right shoulder reveals about 50% active range of motion all planes.  This appears to be pain mediated.  Examination of her lumbar spine reveals mild paraspinous tenderness on the right.  No spinous tenderness.  Positive straight leg raise.  Negative logroll.  No focal weakness.  She is neurovascular intact distally.  Specialty Comments:  No specialty comments available.  Imaging: Xr Hip Unilat W Or W/o Pelvis 2-3 Views Right  Result Date: 09/15/2018 X-rays demonstrate a well-seated prosthesis without complication  Xr Lumbar Spine 2-3 Views  Result Date: 09/15/2018 X-rays demonstrate multilevel degenerative disc disease and facet arthropathy.  No acute findings.    PMFS History: Patient Active Problem List   Diagnosis Date Noted  . Primary osteoarthritis of right hip 04/27/2018  . Status post total hip replacement, 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  . Shortness of breath 01/02/2018  . Chalazion left upper eyelid 02/20/2017  . Vitamin D insufficiency 11/25/2013  .  Healthcare maintenance 11/25/2013  . Spinal stenosis of lumbar region 07/02/2013  . Arthritis, degenerative 11/17/2012  . Baker's cyst of knee 06/05/2012  . Rectocele 04/28/2012  . Left knee DJD, degenerative meniscus tear 04/06/2012  . Numbness and tingling in hands 03/17/2012  . Periodontitis 09/23/2011  . Depression 09/23/2011  . Right knee meniscal tear 03/14/2011  . Lumbar radiculopathy 03/14/2011  . Stress incontinence of urine 10/18/2010  . NECK PAIN, CHRONIC 08/19/2008  . Normocytic anemia 05/12/2008  . SHOULDER PAIN, RIGHT 04/28/2008  . Hyperlipemia 06/18/2007  . Essential hypertension 05/20/2007   Past Medical History:  Diagnosis Date  . Anemia   . Arthritis    knees hands  . Bipolar disorder ([place])   . Cervicalgia   . Depression   . Environmental allergies    cause SOB, uses inhaler for  . Environmental and seasonal allergies    uses inhaler prn  . Full dentures   . GERD (gastroesophageal reflux disease)    diet controlled - no meds  . Hyperlipidemia    diet controlled, no meds  . Hypertension   . Lumbar radiculopathy, chronic   . Right knee pain    posterior horn medial meniscal tear MRI 2013  . Spinal stenosis    getting epidural injections -last one 06/12/2015  . SVD (spontaneous vaginal delivery)    x 4    Family History  Problem Relation Age of Onset  . Hypertension Mother     Past Surgical History:  Procedure Laterality Date  . APPENDECTOMY    . Bladder tack  10/2006   cystocoele  . BREAST SURGERY Right    benign cyst  . COLONOSCOPY    . Hysterectomy other    . TOTAL HIP ARTHROPLASTY Right 04/27/2018   Procedure: RIGHT TOTAL HIP ARTHROPLASTY ANTERIOR APPROACH;  Surgeon: Leandrew Koyanagi, MD;  Location: [place];  Service: Orthopedics;  Laterality: Right;  . TUBAL LIGATION     Social History   Occupational History  . Not on file  Tobacco Use  . Smoking status: Former Smoker    Packs/day: 0.10    Quit date: 11/26/1983    Years since quitting:  34.8  . Smokeless tobacco: Never Used  Substance and Sexual Activity  . Alcohol use: Yes    Alcohol/week: 0.0 standard drinks    Comment: rarely  . Drug use: Not Currently    Types: Marijuana    Comment: last used 2019  . Sexual activity: Not Currently    Birth control/protection: Surgical

## 2018-09-15 NOTE — Telephone Encounter (Signed)
Please see message below, Pt had Rt L3-4 IL 07/27/2018. Please Advise.

## 2018-09-16 NOTE — Telephone Encounter (Signed)
Your interim reference number for this approval is: [id number] Auth No / Request ID  Status Auto-Approved Decision Approved Effective Date 09/16/2018 Expiration Date 10/31/2018

## 2018-09-16 NOTE — Telephone Encounter (Signed)
Called pt and lvm #1 

## 2018-09-16 NOTE — Telephone Encounter (Signed)
Scheduled for 8/20 with driver.

## 2018-09-25 ENCOUNTER — Other Ambulatory Visit: Payer: Self-pay

## 2018-09-25 ENCOUNTER — Telehealth: Payer: Self-pay | Admitting: *Deleted

## 2018-09-25 ENCOUNTER — Ambulatory Visit (INDEPENDENT_AMBULATORY_CARE_PROVIDER_SITE_OTHER): Payer: Medicare HMO | Admitting: Family Medicine

## 2018-09-25 VITALS — BP 131/83

## 2018-09-25 DIAGNOSIS — M48062 Spinal stenosis, lumbar region with neurogenic claudication: Secondary | ICD-10-CM

## 2018-09-25 DIAGNOSIS — Z659 Problem related to unspecified psychosocial circumstances: Secondary | ICD-10-CM

## 2018-09-25 DIAGNOSIS — M5416 Radiculopathy, lumbar region: Secondary | ICD-10-CM

## 2018-09-25 DIAGNOSIS — F430 Acute stress reaction: Secondary | ICD-10-CM | POA: Insufficient documentation

## 2018-09-25 MED ORDER — HYDROCODONE-ACETAMINOPHEN 7.5-325 MG PO TABS: 1.0000 | ORAL_TABLET | Freq: Four times a day (QID) | ORAL | 0 refills | Status: DC | PRN

## 2018-09-25 MED ORDER — KETOROLAC TROMETHAMINE 60 MG/2ML IM SOLN
60.0000 mg | Freq: Once | INTRAMUSCULAR | Status: AC
  Administered 2018-09-25: 60 mg via INTRAMUSCULAR

## 2018-09-25 NOTE — Telephone Encounter (Signed)
Yes, referral placed to [facility]. Sounds like an acute stress reaction or adjustment disorder depending on the timing.
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
## 2018-09-25 NOTE — Patient Instructions (Signed)
I am giving you a short course of narcotic pain meds for your acute back issues. Please follow up with your epidural steroid injection next week. We have also given you a toradol shot which hopefully helps.  I hope things get beeter for you. After your social issues calm down, please come back so we can look at your shoulder.

## 2018-09-25 NOTE — Telephone Encounter (Signed)
Received call from [facility] - to let us know they had spoken to Olivia Ewing; she will call them when she finished with her appt and someone will go to see her.
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
## 2018-09-26 NOTE — Telephone Encounter (Signed)
Sad to hear, thank you all for your help

## 2018-09-29 NOTE — Telephone Encounter (Signed)
We do not have any openings before the 20th at this time.

## 2018-09-30 ENCOUNTER — Encounter: Payer: Self-pay | Admitting: Family Medicine

## 2018-09-30 ENCOUNTER — Telehealth: Payer: Self-pay | Admitting: Licensed Clinical Social Worker

## 2018-09-30 ENCOUNTER — Ambulatory Visit (INDEPENDENT_AMBULATORY_CARE_PROVIDER_SITE_OTHER): Payer: Self-pay | Admitting: Family Medicine

## 2018-09-30 ENCOUNTER — Other Ambulatory Visit: Payer: Self-pay

## 2018-09-30 VITALS — BP 146/92 | Wt 170.0 lb

## 2018-09-30 DIAGNOSIS — M25511 Pain in right shoulder: Secondary | ICD-10-CM
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
## 2018-09-30 NOTE — Patient Instructions (Signed)
Get the shot tomorrow morning - I think this will help you a lot. Take the flexeril up to 3 times a day for spasms if it doesn't make you too sleepy. Use the norco sparingly for severe pain. Heat for spasms also up to 15 minutes at a time. Call you primary as we discussed to work with someone to help with everything that's going on right now.

## 2018-09-30 NOTE — Telephone Encounter (Signed)
Is [facility] a counseling service?  If so, are they going to contact her today?

## 2018-09-30 NOTE — Telephone Encounter (Signed)
Yes they are a mobile crisis type team that makes housecalls, they were calling as I hung up with her and dispatching a team to visit her, I think possibly a csw and therapist

## 2018-09-30 NOTE — Telephone Encounter (Signed)
Mobile crisis made 2 attempts to reach pt today pt not responding, the same team did speak w/ her last week but she avoided any other communication with them.
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
## 2018-09-30 NOTE — Telephone Encounter (Signed)
Pt requesting a call back. 

## 2018-09-30 NOTE — Telephone Encounter (Signed)
Thanks

## 2018-10-01 ENCOUNTER — Ambulatory Visit (INDEPENDENT_AMBULATORY_CARE_PROVIDER_SITE_OTHER): Payer: Medicare HMO | Admitting: Physical Medicine and Rehabilitation

## 2018-10-01 ENCOUNTER — Telehealth: Payer: Self-pay | Admitting: Licensed Clinical Social Worker

## 2018-10-01 ENCOUNTER — Ambulatory Visit: Payer: Medicare HMO | Admitting: Licensed Clinical Social Worker

## 2018-10-01 ENCOUNTER — Encounter: Payer: Self-pay | Admitting: Physical Medicine and Rehabilitation

## 2018-10-01 ENCOUNTER — Ambulatory Visit: Payer: Self-pay

## 2018-10-01 VITALS — BP 149/92 | HR 64

## 2018-10-01 DIAGNOSIS — M5416 Radiculopathy, lumbar region: Secondary | ICD-10-CM

## 2018-10-01 MED ORDER — METHYLPREDNISOLONE ACETATE 80 MG/ML IJ SUSP
80.0000 mg | Freq: Once | INTRAMUSCULAR | Status: AC
  Administered 2018-10-01: 80 mg

## 2018-10-01 NOTE — Procedures (Signed)
Lumbar Epidural Steroid Injection - Interlaminar Approach with Fluoroscopic Guidance  Patient: Olivia Ewing      Date of Birth: 29-Mar-1950 MRN: [id number] PCP: Katherine Roan, MD      Visit Date: 10/01/2018   Universal Protocol:     Consent Given By: the patient  Position: PRONE  Additional Comments: Vital signs were monitored before and after the procedure. Patient was prepped and draped in the usual sterile fashion. The correct patient, procedure, and site was verified.   Injection Procedure Details:  Procedure Site One Meds Administered:  Meds ordered this encounter  Medications  . methylPREDNISolone acetate (DEPO-MEDROL) injection 80 mg     Laterality: Right  Location/Site:  L3-L4  Needle size: 20 G  Needle type: Tuohy  Needle Placement: Paramedian epidural  Findings:   -Comments: Excellent flow of contrast into the epidural space.  Procedure Details: Using a paramedian approach from the side mentioned above, the region overlying the inferior lamina was localized under fluoroscopic visualization and the soft tissues overlying this structure were infiltrated with 4 ml. of 1% Lidocaine without Epinephrine. The Tuohy needle was inserted into the epidural space using a paramedian approach.   The epidural space was localized using loss of resistance along with lateral and bi-planar fluoroscopic views.  After negative aspirate for air, blood, and CSF, a 2 ml. volume of Isovue-250 was injected into the epidural space and the flow of contrast was observed. Radiographs were obtained for documentation purposes.    The injectate was administered into the level noted above.   Additional Comments:  The patient tolerated the procedure well Dressing: 2 x 2 sterile gauze and Band-Aid    Post-procedure details: Patient was observed during the procedure. Post-procedure instructions were reviewed.  Patient left the clinic in stable condition.

## 2018-10-01 NOTE — Progress Notes (Signed)
   Numeric Pain Rating Scale and Functional Assessment Average Pain (8)   In the last MONTH (on 0-10 scale) has pain interfered with the following?  1. General activity like being  able to carry out your everyday physical activities such as walking, climbing stairs, carrying groceries, or moving a chair?  Rating(9)   +Driver, -BT, -Dye Allergies.  

## 2018-10-01 NOTE — Telephone Encounter (Signed)
Patient was contacted for her scheduled appointment. Patient did not answer, and a vm was left for the patient.
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
## 2018-10-02 ENCOUNTER — Telehealth: Payer: Self-pay | Admitting: Obstetrics and Gynecology

## 2018-10-02 ENCOUNTER — Encounter: Payer: Self-pay | Admitting: Licensed Clinical Social Worker

## 2018-10-02 ENCOUNTER — Telehealth: Payer: Self-pay | Admitting: Licensed Clinical Social Worker

## 2018-10-02 NOTE — Telephone Encounter (Signed)
Attempted to reach patient about rescheduling her appointment. Left a voicemail message for her to give [name] a call.

## 2018-10-02 NOTE — Telephone Encounter (Signed)
Patient was contacted (2nd attempt) to establish care and reschedule her appointment from yesterday. Patient did not answer, and a vm was left for the patient to contact our office.

## 2018-10-06 ENCOUNTER — Telehealth: Payer: Self-pay | Admitting: Licensed Clinical Social Worker

## 2018-10-06 NOTE — Telephone Encounter (Signed)
Patient returned the office phone call due to missing her initial appointment. Pt will be R/S to 8/27 @ 10:30 for an in person visit.

## 2018-10-08 ENCOUNTER — Telehealth: Payer: Self-pay | Admitting: Internal Medicine

## 2018-10-08 ENCOUNTER — Ambulatory Visit (INDEPENDENT_AMBULATORY_CARE_PROVIDER_SITE_OTHER): Payer: Medicare HMO | Admitting: Licensed Clinical Social Worker

## 2018-10-08 ENCOUNTER — Other Ambulatory Visit: Payer: Self-pay

## 2018-10-08 DIAGNOSIS — F430 Acute stress reaction: Secondary | ICD-10-CM

## 2018-10-08 NOTE — Telephone Encounter (Signed)
Patient is on the way, she in traffic for her appt [phone]

## 2018-10-08 NOTE — BH Specialist Note (Signed)
Integrated Behavioral Health Initial Visit  MRN: [id number] Name: Olivia Ewing  Number of [place] Clinician visits:: 1/6 Session Start time: 10:45  Session End time: 11:15 Total time: 30 minutes  Type of Service: Integrated Behavioral Health- Individual Interpretor:No.          SUBJECTIVE: Olivia Ewing is a 68 y.o. female  whom attended the session individually.  Patient was referred by Dr. Shan Levans for grief and anxiety. Patient reports the following symptoms/concerns: acute stress, grief, anger, sleep issues, fleeting SI, and financial challenges.   Duration of problem: past 6 weeks; Severity of problem: severe  OBJECTIVE: Mood: Angry and Depressed and Affect: Depressed and Tearful Risk of harm to self or others: No plan to harm self or others. However, the patient reported having fleeting thoughts of not being here anymore, but has no plan or intent.   LIFE CONTEXT: Family and Social: Patient has one son that is currently incarcerated, and her other son murdered his girlfriend around two weeks ago. Patient reported that her son has not been caught by the police at this time. Patient also had a brother pass away around one month ago, and is angry at her family for not sharing the severity of his condition with her.  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  Self-Care: Poor sleep hygiene.  Life Changes: Increase stress, recent family losses, and endured an injury while at the mall shopping.   GOALS ADDRESSED: Patient will: 1. Reduce symptoms of: agitation, anxiety, depression, stress and grief. 2. Increase knowledge and/or ability of: coping skills, healthy habits and stress reduction  3. Demonstrate ability to: Increase healthy adjustment to current life circumstances, Increase adequate support systems for patient/family and  Begin healthy grieving over loss  INTERVENTIONS: Interventions utilized: Mindfulness or Relaxation Training, Behavioral Activation, Brief CBT and Supportive Counseling  Standardized Assessments completed: assessed for SI, HI, and self-harm.  ASSESSMENT: Patient currently experiencing severe levels of grief and acute stress. Patient has experienced two unexpected family losses in the past month. Patient is angry with her family for not sharing the severity of her brother's health, and feels she was unable to say goodbye to him. Patient's son murdered his girlfriend, and is currently on the run from the police. Patient is overwhelmed with anger and fear. Patient reported frequent panic attacks over the past month, and she had one while in the office. Patient was able to calm herself down after about ten minutes. Patient reported minimal sleep over the past month, and this has increased in severity over the past two weeks.   Patient identified recent thoughts of not being here anymore, but she was able to challenge these thoughts by identifying her purpose/reason for living.    Patient may benefit from counseling.  PLAN: 1. Follow up with behavioral health clinician on : one week.   Dessie Coma, [facility], [place]

## 2018-10-14 ENCOUNTER — Encounter: Payer: Self-pay | Admitting: Licensed Clinical Social Worker

## 2018-10-15 ENCOUNTER — Ambulatory Visit: Payer: Medicare HMO | Admitting: Licensed Clinical Social Worker

## 2018-10-20 ENCOUNTER — Ambulatory Visit: Payer: Medicare HMO | Admitting: Licensed Clinical Social Worker

## 2018-10-28 ENCOUNTER — Ambulatory Visit (INDEPENDENT_AMBULATORY_CARE_PROVIDER_SITE_OTHER): Payer: Medicare HMO

## 2018-10-28 ENCOUNTER — Encounter: Payer: Self-pay | Admitting: Orthopaedic Surgery

## 2018-10-28 ENCOUNTER — Other Ambulatory Visit: Payer: Self-pay | Admitting: Physician Assistant

## 2018-10-28 ENCOUNTER — Other Ambulatory Visit: Payer: Self-pay

## 2018-10-28 ENCOUNTER — Ambulatory Visit (INDEPENDENT_AMBULATORY_CARE_PROVIDER_SITE_OTHER): Payer: Medicare HMO | Admitting: Orthopaedic Surgery

## 2018-10-28 VITALS — Ht 65.0 in | Wt 170.0 lb

## 2018-10-28 DIAGNOSIS — M5416 Radiculopathy, lumbar region: Secondary | ICD-10-CM

## 2018-10-28 MED ORDER — OXYCODONE-ACETAMINOPHEN 7.5-325 MG PO TABS: 1.0000 | ORAL_TABLET | Freq: Every day | ORAL | 0 refills | Status: DC | PRN

## 2018-10-28 MED ORDER — PREDNISONE 10 MG (21) PO TBPK: ORAL_TABLET | ORAL | 0 refills | Status: DC

## 2018-10-28 MED ORDER — METHOCARBAMOL 500 MG PO TABS: 500.0000 mg | ORAL_TABLET | Freq: Two times a day (BID) | ORAL | 0 refills | Status: DC | PRN

## 2018-10-28 NOTE — Progress Notes (Signed)
Office Visit Note   Patient: Olivia Ewing           Date of Birth: 05-Apr-1950           MRN: [id number] Visit Date: 10/28/2018              Requested by: Katherine Roan, MD [place],  [place] [id number] PCP: Katherine Roan, MD   Assessment & Plan: Visit Diagnoses:  1. Lumbar radiculopathy     Plan: Impression is right-sided lumbar radiculopathy.  We will start the patient with a Sterapred taper, muscle relaxer and 1 small prescription of Percocet which she requested to help her get through her car ride.  We will also go ahead and refer her to Dr. Ernestina Patches for repeat [facility].  She will follow-up with [name] in 6 months time for repeat evaluation and AP pelvis lateral x-rays from her right total hip replacement.  Call with concerns or questions in the meantime.  Follow-Up Instructions: Return in about 6 months (around 04/27/2019) for for f/u of right tha.   Orders:  Orders Placed This Encounter  Procedures  . XR HIP UNILAT W OR W/O PELVIS 2-3 VIEWS RIGHT   Meds ordered this encounter  Medications  . oxyCODONE-acetaminophen (PERCOCET) 7.5-325 MG tablet    Sig: Take 1 tablet by mouth daily as needed for severe pain.    Dispense:  10 tablet    Refill:  0  . predniSONE (STERAPRED UNI-PAK 21 TAB) 10 MG (21) TBPK tablet    Sig: Take as directed    Dispense:  21 tablet    Refill:  0  . methocarbamol (ROBAXIN) 500 MG tablet    Sig: Take 1 tablet (500 mg total) by mouth 2 (two) times daily as needed for muscle spasms.    Dispense:  20 tablet    Refill:  0      Procedures: No procedures performed   Clinical Data: No additional findings.   Subjective: Chief Complaint  Patient presents with  . Right Hip - Pain  . Lower Back - Pain    HPI patient is a 68 year old female who comes in today for follow-up of her right lower extremity.  She is approximately 6 months status post right anterior total hip replacement 04/27/2018 and approximately 3 months out from an [facility]  from Dr. Ernestina Patches due to multilevel degenerative disc disease and facet arthropathy.  She was doing well with both until 09/11/2018.  She was walking through the mall on the first floor when a ceiling tile fell from the third floor landing on her right extremity.  She was seen in our office a few days after with pain to the right lower extremity.  She was given a steroid taper muscle relaxer which seemed to dull her pain.  She comes in today with recurrent pain.  This is primarily to the right lower back and into her buttocks which radiates down the back of her leg and occasionally into her calf.  No radicular symptoms.  No bowel or bladder incontinence and no saddle paresthesias.  She is having trouble ambulating secondary to the pain.  She is driving to [facility] next week for her brother's funeral and is worried about the trip in the car due to this issue.  Review of Systems as detailed in HPI.  All others reviewed and are negative.   Objective: Vital Signs: Ht 5\' 5"  (1.651 m)   Wt 170 lb (77.1 kg)  BMI 28.29 kg/m   Physical Exam well-developed and well-nourished female no acute distress.  Alert and oriented x3.  Ortho Exam examination of her right lower extremity reveals a well-healing surgical incision to the right hip.  no evidence of infection or cellulitis.  Negative logroll she has markedly positive straight leg raise.  No focal weakness.  She is neurovascular intact distally.  Specialty Comments:  No specialty comments available.  Imaging: Xr Hip Unilat W Or W/o Pelvis 2-3 Views Right  Result Date: 10/28/2018 Well-seated prosthesis without complication    PMFS History: Patient Active Problem List   Diagnosis Date Noted  . Acute stress reaction 09/25/2018  . Primary osteoarthritis of right hip 04/27/2018  . Status post total hip replacement, 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  . Shortness of breath 01/02/2018  . Chalazion left upper eyelid 02/20/2017  . Vitamin D insufficiency 11/25/2013  .  Healthcare maintenance 11/25/2013  . Spinal stenosis of lumbar region 07/02/2013  . Arthritis, degenerative 11/17/2012  . Baker's cyst of knee 06/05/2012  . Rectocele 04/28/2012  . Left knee DJD, degenerative meniscus tear 04/06/2012  . Numbness and tingling in hands 03/17/2012  . Periodontitis 09/23/2011  . Depression 09/23/2011  . Right knee meniscal tear 03/14/2011  . Lumbar radiculopathy 03/14/2011  . Stress incontinence of urine 10/18/2010  . NECK PAIN, CHRONIC 08/19/2008  . Normocytic anemia 05/12/2008  . SHOULDER PAIN, RIGHT 04/28/2008  . Hyperlipemia 06/18/2007  . Essential hypertension 05/20/2007   Past Medical History:  Diagnosis Date  . Anemia   . Arthritis    knees hands  . Bipolar disorder ([place])   . Cervicalgia   . Depression   . Environmental allergies    cause SOB, uses inhaler for  . Environmental and seasonal allergies    uses inhaler prn  . Full dentures   . GERD (gastroesophageal reflux disease)    diet controlled - no meds  . Hyperlipidemia    diet controlled, no meds  . Hypertension   . Lumbar radiculopathy, chronic   . Right knee pain    posterior horn medial meniscal tear MRI 2013  . Spinal stenosis    getting epidural injections -last one 06/12/2015  . SVD (spontaneous vaginal delivery)    x 4    Family History  Problem Relation Age of Onset  . Hypertension Mother     Past Surgical History:  Procedure Laterality Date  . APPENDECTOMY    . Bladder tack  10/2006   cystocoele  . BREAST SURGERY Right    benign cyst  . COLONOSCOPY    . Hysterectomy other    . TOTAL HIP ARTHROPLASTY Right 04/27/2018   Procedure: RIGHT TOTAL HIP ARTHROPLASTY ANTERIOR APPROACH;  Surgeon: Leandrew Koyanagi, MD;  Location: [facility];  Service: Orthopedics;  Laterality: Right;  . TUBAL LIGATION     Social History   Occupational History  . Not on file  Tobacco Use  . Smoking status: Former Smoker    Packs/day: 0.10    Quit date: 11/26/1983    Years since quitting:  34.9  . Smokeless tobacco: Never Used  Substance and Sexual Activity  . Alcohol use: Yes    Alcohol/week: 0.0 standard drinks    Comment: rarely  . Drug use: Not Currently    Types: Marijuana    Comment: last used 2019  . Sexual activity: Not Currently    Birth control/protection: Surgical

## 2018-10-30 ENCOUNTER — Ambulatory Visit: Payer: Medicare HMO | Admitting: Family Medicine

## 2018-11-06 ENCOUNTER — Other Ambulatory Visit: Payer: Self-pay

## 2018-11-06 ENCOUNTER — Ambulatory Visit (INDEPENDENT_AMBULATORY_CARE_PROVIDER_SITE_OTHER): Payer: Medicare HMO | Admitting: Family Medicine

## 2018-11-06 VITALS — BP 149/94 | Ht 65.0 in | Wt 170.0 lb

## 2018-11-06 DIAGNOSIS — M48062 Spinal stenosis, lumbar region with neurogenic claudication: Secondary | ICD-10-CM | POA: Diagnosis not present

## 2018-11-06 DIAGNOSIS — M5416 Radiculopathy, lumbar region: Secondary | ICD-10-CM

## 2018-11-06 MED ORDER — KETOROLAC TROMETHAMINE 60 MG/2ML IM SOLN
60.0000 mg | Freq: Once | INTRAMUSCULAR | Status: AC
  Administered 2018-11-06: 10:00:00 60 mg via INTRAMUSCULAR

## 2018-11-06 NOTE — Progress Notes (Signed)
[facility]: Attending Note: I have reviewed the chart, discussed wit the Sports Medicine Fellow. I agree with assessment and treatment plan as detailed in the [place] note. She has had good improvement with epidural steroid injections.  She has another one scheduled.  We will give her some Toradol today to see if we can improve her pain for today.  Also gave her home exercise program consisting of quadruped exercise only.  Like to start her gradually.  I would like to see her back after her next ESI.

## 2018-11-06 NOTE — Progress Notes (Signed)
PCP: Katherine Roan, MD  Subjective:   HPI: Patient is a 68 y.o. female here for right shoulder pain.  8/14: 68 yo female presenting with lower back pain and right shoulder pain. Two weeks ago, she had an incident where a ceiling tile fell from 3rd floor and landed on her right shoulder and back.  Since that time, she has had pain in her lower back as well as shoulder. Unable to get in a comfortable position. She was given sterapred taper and a muscle relaxer by ortho clinic 10 days ago but reports that she does not have any more medicine and is in incredible pain. X-rays on 09/15/2018 show degenerative disc disease and facet arthropathy with no acute fracture, shoulder imaging with AC and GH arthrosis but no fracture. Denies numbness/tingling down legs, urinary or stool incontinence.   She has chronic back pain with lumbar radiculopathy. She last had lumbar epidural steroid injection with Dr Ernestina Patches on 6/15. Scheduled for repeat injection on 8/19 but is requesting if we can move it sooner as she is in unbearable pain.  Sees Dr. Erlinda Hong s/p total hip replacement in March 2020. She has also seen Dr. Erlinda Hong regarding back and shoulder pain after this accident.   She is in distress today given a distraught family circumstance. She is planning on going to the crisis center after leaving the office today.   8/19: Patient presented today requesting toradol injection to tide her over to visit tomorrow for [facility] - was given one on 8/14 and prescribed norco. She states she still has some of the norco left and taking flexeril just in evenings. Pain more in low back going into both legs but also still having pain lateral right shoulder. A lot of spasms in low back. Right shoulder pain mildly improved but still present, states having numbness into thumb through middle digits at times. Very emotional today given loss of two brothers and issues surrounding her son.  9/25: Last lumbar epidural steroid injection was  8/20 (L3-L4), next scheduled for 10/5 with Dr. Ernestina Patches Followed by Dr. Erlinda Hong as well (hip replacement) Shoulder has improved Has been more functional- able to walk around. Alternates between sitting and standing frequently due to back/buttocks. Currently, gluteal muscles are very tense.  She is doing better overall, crying less. She is excited that her sibling bought her a flight to [facility] in October- first time flying in her life.    Review of Systems: See HPI above.  PMHx - Updated and reviewed. Contributory factors include: Negative PSHx - Updated and reviewed. Contributory factors include: Negative FHx - Updated and reviewed. Contributory factors include: Negative Social Hx - Updated and reviewed. Contributory factors include: Negative Medications - reviewed   DATA REVIEWED: Prior records   There were no vitals taken for this visit.  PHYSICAL EXAM: Gen: NAD, alert, cooperative with exam, Appears much better than prior exam. Sitting up in chair. HEENT: clear conjunctiva,  CV:  no edema, capillary refill brisk, normal rate Resp: non-labored Skin: no rashes, normal turgor  Neuro: no gross deficits.  Psych:  alert and oriented  Lower back- noted spasms. TTP in paraspinal muscles. No TTP along spinous processes. Gluteal muscles- noted spasms Full ROM in spine Good strength in lower extremities and upper extremities. Shoulder- improved ROM.        Assessment & Plan:   Low back pain - followed by Dr. Ernestina Patches - due for [facility] on 10/5. Will provide toradol today. Encouraged to use heat, gentle  exercises. Provided back extension exercises.  Mental health seems improved-- she reports that she is doing much better despite the circumstances surrounding her son.

## 2018-11-11 ENCOUNTER — Ambulatory Visit (INDEPENDENT_AMBULATORY_CARE_PROVIDER_SITE_OTHER): Payer: Medicare HMO | Admitting: Licensed Clinical Social Worker

## 2018-11-11 ENCOUNTER — Other Ambulatory Visit: Payer: Self-pay

## 2018-11-11 ENCOUNTER — Telehealth: Payer: Self-pay | Admitting: Orthopaedic Surgery

## 2018-11-11 DIAGNOSIS — F430 Acute stress reaction: Secondary | ICD-10-CM

## 2018-11-11 NOTE — Telephone Encounter (Signed)
Patient states she would like Rf on [facility]   [phone]  [facility] [address]

## 2018-11-11 NOTE — BH Specialist Note (Signed)
Integrated Behavioral Health Follow Up Visit  MRN: [id number] Name: Olivia Ewing  Number of [place] Clinician visits: 2/6 Session Start time: 10:10  Session End time: 10:55 Total time: 45 minutes  Type of Service: Integrated Behavioral Health- Individual Interpretor:No.  SUBJECTIVE: Olivia Ewing is a 68 y.o. female  whom attended the session individually.  Patient was referred by Dr. Shan Levans for grief and anxiety. Patient reports the following symptoms/concerns: panic, acute stress, grief, anger, sleep issues, and financial challenges.  Duration of problem: past two months; Severity of problem: moderate to severe.  OBJECTIVE: Mood: Anxious and Depressed and Affect: Tearful Risk of harm to self or others: No plan to harm self or others  LIFE CONTEXT: Family and Social: Patient processed feelings towards her son's recent choices, and how that has impacted her stress levels. Patient had to cooperate with the police to locate her son.  School/Work: Still unemployed due to [employer]. Self-Care: Poor sleep issues. Life Changes: Son was arrested.   GOALS ADDRESSED: Patient will: 1.  Reduce symptoms of: agitation, anxiety, depression, insomnia and stress  2.  Increase knowledge and/or ability of: coping skills, healthy habits, self-management skills and stress reduction  3.  Demonstrate ability to: Increase healthy adjustment to current life circumstances, Increase adequate support systems for patient/family and Begin healthy grieving over loss  INTERVENTIONS: Interventions utilized:  Mindfulness or Relaxation Training, Behavioral Activation, Brief CBT and Supportive Counseling Standardized Assessments completed: assessed for SI, HI, and self-harm.  ASSESSMENT: Patient currently experiencing ongoing stress and adjustment to major life circumstances. Patient continues to report sleep issues (staying and falling asleep). Patient's anxiety has decreased slightly  since the previous session, but she continues to report feeling anxious daily. Patient is continuing to have panic attacks and somatic symptoms related to stress. Patient was tearful throughout the session due to discussing the family challenges she is having with her son's. Patient has decided she cannot carry the choices her son's are making.    Patient may benefit from counseling.  PLAN: 1. Follow up with behavioral health clinician on : two weeks.   Dessie Coma, [facility], [place]

## 2018-11-11 NOTE — Telephone Encounter (Signed)
Will you see what for?

## 2018-11-11 NOTE — Telephone Encounter (Signed)
For her nose. She said she used it before and it helped her a lot. She states she feels like a scab in her nose and that usually helps.

## 2018-11-11 NOTE — Telephone Encounter (Signed)
Patient left a message on the voicemail stating that she needs to talk to a nurse. No reason given. Her call back number is [phone]

## 2018-11-11 NOTE — Telephone Encounter (Signed)
Once more, but if still having issues, should probably contact pcp

## 2018-11-12 ENCOUNTER — Encounter: Payer: Self-pay | Admitting: Licensed Clinical Social Worker

## 2018-11-12 ENCOUNTER — Ambulatory Visit: Payer: Medicare HMO | Admitting: Licensed Clinical Social Worker

## 2018-11-13 ENCOUNTER — Other Ambulatory Visit: Payer: Self-pay | Admitting: Physician Assistant

## 2018-11-13 ENCOUNTER — Other Ambulatory Visit: Payer: Self-pay

## 2018-11-13 MED ORDER — MUPIROCIN CALCIUM 2 % EX CREA: 1.0000 "application " | TOPICAL_CREAM | Freq: Two times a day (BID) | CUTANEOUS | 0 refills | Status: DC

## 2018-11-13 NOTE — Telephone Encounter (Signed)
Sent in to pharm.

## 2018-11-16 ENCOUNTER — Ambulatory Visit (INDEPENDENT_AMBULATORY_CARE_PROVIDER_SITE_OTHER): Payer: Medicare HMO | Admitting: Physical Medicine and Rehabilitation

## 2018-11-16 ENCOUNTER — Encounter: Payer: Self-pay | Admitting: Physical Medicine and Rehabilitation

## 2018-11-16 ENCOUNTER — Ambulatory Visit: Payer: Self-pay

## 2018-11-16 VITALS — BP 131/87 | HR 90

## 2018-11-16 DIAGNOSIS — M5416 Radiculopathy, lumbar region: Secondary | ICD-10-CM | POA: Diagnosis not present

## 2018-11-16 MED ORDER — BETAMETHASONE SOD PHOS & ACET 6 (3-3) MG/ML IJ SUSP
12.0000 mg | Freq: Once | INTRAMUSCULAR | Status: AC
  Administered 2018-11-16: 12 mg

## 2018-11-16 NOTE — Progress Notes (Signed)
 .  Numeric Pain Rating Scale and Functional Assessment Average Pain 9   In the last MONTH (on 0-10 scale) has pain interfered with the following?  1. General activity like being  able to carry out your everyday physical activities such as walking, climbing stairs, carrying groceries, or moving a chair?  Rating(9)   +Driver, -BT, -Dye Allergies.  

## 2018-11-19 ENCOUNTER — Ambulatory Visit: Payer: Medicare HMO | Admitting: Licensed Clinical Social Worker

## 2018-12-11 ENCOUNTER — Ambulatory Visit: Payer: Medicare HMO | Admitting: Family Medicine

## 2018-12-16 ENCOUNTER — Encounter: Payer: Self-pay | Admitting: Licensed Clinical Social Worker

## 2018-12-16 ENCOUNTER — Other Ambulatory Visit: Payer: Self-pay

## 2018-12-16 ENCOUNTER — Ambulatory Visit (INDEPENDENT_AMBULATORY_CARE_PROVIDER_SITE_OTHER): Payer: Medicare HMO | Admitting: Licensed Clinical Social Worker

## 2018-12-16 DIAGNOSIS — F430 Acute stress reaction: Secondary | ICD-10-CM

## 2018-12-16 NOTE — BH Specialist Note (Signed)
Integrated Behavioral Health Visit via Telemedicine (Telephone)  12/16/2018 TRANESE DELAWDER [id number]   Session Start time: 11;07  Session End time: 11;27 Total time: 20 minutes  Referring Provider: Dr. Shan Levans Type of Visit: Telephonic Patient location: [facility] Provider location: Office All persons participating in visit: Patient and IBH  Confirmed patient's address: Yes  Confirmed patient's phone number: Yes  Any changes to demographics: No   Discussed confidentiality: Yes    The following statements were read to the patient and/or legal guardian that are established with the [facility] Provider.  "The purpose of this phone visit is to provide behavioral health care while limiting exposure to the coronavirus (COVID19).  There is a possibility of technology failure and discussed alternative modes of communication if that failure occurs."  "By engaging in this telephone visit, you consent to the provision of healthcare.  Additionally, you authorize for your insurance to be billed for the services provided during this telephone visit."   Patient and/or legal guardian consented to telephone visit: Yes   PRESENTING CONCERNS: Patient and/or family reports the following symptoms/concerns: panic, acute stress, grief, anger, sleep issues, and financial challenges.  Duration of problem: past two months; Severity of problem: moderate  GOALS ADDRESSED: Patient will: 1.  Reduce symptoms of: anxiety, depression, insomnia and stress  2.  Increase knowledge and/or ability of: coping skills, healthy habits, self-management skills and stress reduction  3.  Demonstrate ability to: Increase healthy adjustment to current life circumstances, Increase adequate support systems for patient/family and Begin healthy grieving over loss  INTERVENTIONS: Interventions utilized:  Supportive Counseling Standardized Assessments completed: Not Needed  ASSESSMENT: Patient currently experiencing  ongoing stress and depression. Patient recently visited her brother in [place], and reported this was a helpful trip for her mental health. Patient processed how powerful this trip was for giving her a break from her reality her at home. Patient continues to have stress related to her son's legal situation, but did not want to process this during this session. Patient reported increased positivity, and is she is hopeful that she can continue with integrating positivity into her day.   Patient may benefit from counseling.  PLAN: 1. Follow up with behavioral health clinician on : two weeks.   Dessie Coma, [facility], [place]

## 2018-12-17 NOTE — Progress Notes (Signed)
Olivia Ewing - 68 y.o. female MRN [id number]  Date of birth: [birth year]  Office Visit Note: Visit Date: 11/16/2018 PCP: Katherine Roan, MD Referred by: Katherine Roan, MD  Subjective: Chief Complaint  Patient presents with  . Right Thigh - Pain  . Left Thigh - Pain   HPI:  Olivia Ewing is a 68 y.o. female who comes in today At the request of Dr. Eduard Roux for interventional spine procedure.  Patient's been having spasms and pain in both buttocks and the back of both thighs.  This started several months ago and is really failed conservative care and medication management.  She had Toradol injections at some point with some relief.  Average pain is 9 out of 10.  MRI is reviewed shows moderate to severe multifactorial stenosis at L4-5 with lateral recess narrowing at L3-4.  We will complete interlaminar injection at L3-4.  Patient is very anxious about the injection.  In the future may look at giving her some Valium preprocedure.  She may need bilateral transforaminal injections at L4.  Could likely be surgical candidate.  ROS Otherwise per HPI.  Assessment & Plan: Visit Diagnoses:  1. Lumbar radiculopathy     Plan: No additional findings.   Meds & Orders:  Meds ordered this encounter  Medications  . betamethasone acetate-betamethasone sodium phosphate (CELESTONE) injection 12 mg    Orders Placed This Encounter  Procedures  . XR C-ARM NO REPORT  . Epidural Steroid injection    Follow-up: Return if symptoms worsen or fail to improve.   Procedures: No procedures performed  Lumbar Epidural Steroid Injection - Interlaminar Approach with Fluoroscopic Guidance  Patient: Olivia Ewing      Date of Birth: 1950-05-23 MRN: [id number] PCP: Katherine Roan, MD      Visit Date: 11/16/2018   Universal Protocol:     Consent Given By: the patient  Position: PRONE  Additional Comments: Vital signs were monitored before and after the procedure. Patient was  prepped and draped in the usual sterile fashion. The correct patient, procedure, and site was verified.   Injection Procedure Details:  Procedure Site One Meds Administered:  Meds ordered this encounter  Medications  . betamethasone acetate-betamethasone sodium phosphate (CELESTONE) injection 12 mg     Laterality: Left  Location/Site:  L3-L4  Needle size: 20 G  Needle type: Tuohy  Needle Placement: Paramedian epidural  Findings:   -Comments: Excellent flow of contrast into the epidural space.  Procedure Details: Using a paramedian approach from the side mentioned above, the region overlying the inferior lamina was localized under fluoroscopic visualization and the soft tissues overlying this structure were infiltrated with 4 ml. of 1% Lidocaine without Epinephrine. The Tuohy needle was inserted into the epidural space using a paramedian approach.   The epidural space was localized using loss of resistance along with lateral and bi-planar fluoroscopic views.  After negative aspirate for air, blood, and CSF, a 2 ml. volume of Isovue-250 was injected into the epidural space and the flow of contrast was observed. Radiographs were obtained for documentation purposes.    The injectate was administered into the level noted above.   Additional Comments:  The patient tolerated the procedure well Dressing: 2 x 2 sterile gauze and Band-Aid    Post-procedure details: Patient was observed during the procedure. Post-procedure instructions were reviewed.  Patient left the clinic in stable condition.   Clinical History: MRI LUMBAR SPINE WITHOUT CONTRAST  TECHNIQUE: Multiplanar, multisequence MR imaging  of the lumbar spine was performed. No intravenous contrast was administered.  COMPARISON:  MRI lumbar spine 05/20/2015  FINDINGS: Segmentation: 5 non rib-bearing lumbar type vertebral bodies are present. The lowest fully formed vertebral body is labeled L5.  Alignment:  Slight retrolisthesis at L3-4 and L4-5 is stable. Levoconvex curvature is centered at L4.  Vertebrae: Chronic endplate marrow changes are evident on the right at L4-5 and to lesser extent at L3-4. An inferior endplate Schmorl's node at L2 has progressed. Marrow signal and vertebral body heights are otherwise normal.  Conus medullaris and cauda equina: Conus extends to the L1-2 level. Conus and cauda equina appear normal.  Paraspinal and other soft tissues: A prominent left renal cyst is only partially imaged. No other focal lesions are present. No significant adenopathy is present.  Disc levels:  L1-2: A broad-based disc protrusion and moderate bilateral facet hypertrophy is stable. Mild left subarticular narrowing is stable. The foramina are patent.  L2-3: A broad-based disc protrusion is present. Moderate facet hypertrophy is present bilaterally. Mild subarticular and foraminal narrowing is present bilaterally, left greater than right.  L3-4: A broad-based disc protrusion is present. Moderate right and mild left subarticular narrowing is again seen. Moderate foraminal stenosis bilaterally is stable.  L4-5: Broad-based disc protrusion is again seen. Moderate to severe central canal stenosis is unchanged. Moderate to severe foraminal stenosis is stable is well, slightly worse on the right.  L5-S1: A rightward disc protrusion is again seen. Mild right subarticular narrowing is evident. The foramina are patent bilaterally.  IMPRESSION: 1. 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   Electronically Signed   By: San Morelle M.D.   On: 05/19/2017 13:39     Objective:  VS:  HT:    WT:   BMI:     BP:131/87  HR:90bpm  TEMP: ( )  RESP:  Physical Exam  Ortho Exam Imaging: No results found.

## 2018-12-17 NOTE — Procedures (Signed)
Lumbar Epidural Steroid Injection - Interlaminar Approach with Fluoroscopic Guidance  Patient: Olivia Ewing      Date of Birth: Aug 09, 1950 MRN: [id number] PCP: Katherine Roan, MD      Visit Date: 11/16/2018   Universal Protocol:     Consent Given By: the patient  Position: PRONE  Additional Comments: Vital signs were monitored before and after the procedure. Patient was prepped and draped in the usual sterile fashion. The correct patient, procedure, and site was verified.   Injection Procedure Details:  Procedure Site One Meds Administered:  Meds ordered this encounter  Medications  . betamethasone acetate-betamethasone sodium phosphate (CELESTONE) injection 12 mg     Laterality: Left  Location/Site:  L3-L4  Needle size: 20 G  Needle type: Tuohy  Needle Placement: Paramedian epidural  Findings:   -Comments: Excellent flow of contrast into the epidural space.  Procedure Details: Using a paramedian approach from the side mentioned above, the region overlying the inferior lamina was localized under fluoroscopic visualization and the soft tissues overlying this structure were infiltrated with 4 ml. of 1% Lidocaine without Epinephrine. The Tuohy needle was inserted into the epidural space using a paramedian approach.   The epidural space was localized using loss of resistance along with lateral and bi-planar fluoroscopic views.  After negative aspirate for air, blood, and CSF, a 2 ml. volume of Isovue-250 was injected into the epidural space and the flow of contrast was observed. Radiographs were obtained for documentation purposes.    The injectate was administered into the level noted above.   Additional Comments:  The patient tolerated the procedure well Dressing: 2 x 2 sterile gauze and Band-Aid    Post-procedure details: Patient was observed during the procedure. Post-procedure instructions were reviewed.  Patient left the clinic in stable  condition.

## 2018-12-18 ENCOUNTER — Telehealth: Payer: Self-pay | Admitting: Orthopaedic Surgery

## 2018-12-18 ENCOUNTER — Ambulatory Visit (INDEPENDENT_AMBULATORY_CARE_PROVIDER_SITE_OTHER): Payer: Self-pay | Admitting: Family Medicine

## 2018-12-18 ENCOUNTER — Other Ambulatory Visit: Payer: Self-pay

## 2018-12-18 VITALS — BP 156/93 | Ht 65.0 in | Wt 170.0 lb

## 2018-12-18 DIAGNOSIS — M48062 Spinal stenosis, lumbar region with neurogenic claudication: Secondary | ICD-10-CM

## 2018-12-18 MED ORDER — HYDROCODONE-ACETAMINOPHEN 10-325 MG PO TABS: 1.0000 | ORAL_TABLET | Freq: Three times a day (TID) | ORAL | 0 refills | Status: AC | PRN

## 2018-12-18 MED ORDER — DIAZEPAM 5 MG PO TABS: ORAL_TABLET | ORAL | 0 refills | Status: DC

## 2018-12-18 NOTE — Telephone Encounter (Signed)
Returned call to patient left message to call back concerning scheduling an appointment with Dr Erlinda Hong per her request
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
## 2018-12-29 ENCOUNTER — Other Ambulatory Visit: Payer: Medicare HMO

## 2018-12-29 ENCOUNTER — Telehealth: Payer: Self-pay | Admitting: Physical Medicine and Rehabilitation

## 2018-12-29 DIAGNOSIS — M48061 Spinal stenosis, lumbar region without neurogenic claudication: Secondary | ICD-10-CM

## 2018-12-29 DIAGNOSIS — M5416 Radiculopathy, lumbar region: Secondary | ICD-10-CM

## 2018-12-29 NOTE — Telephone Encounter (Signed)
Please look at note from Dr. Nori Riis on 11/6.  Appears that she was ordering a stat MRI of the lumbar spine but I cannot see if this is been completed or not.  The patient has severe lumbar stenosis at L4-5.  She could try an injection at L4 from a transforaminal approach but she really probably needs to see her neurosurgeon Dr. Christella Noa or Dr. Merrilee Seashore and our office for decompression if possible.  Dr. Milta Deiters did increase her pain medication as she ordered the MRI.

## 2018-12-29 NOTE — Telephone Encounter (Signed)
Placed referral to Dr. Louanne Skye at patient's request. She states that she is waiting on the [facility] to call her back to schedule the MRI. Ok to schedule injection also?

## 2018-12-29 NOTE — Telephone Encounter (Signed)
Patient wants to know if she can get an injection asap, due to the fact  Dr Otho Ket first available new back appointment is not until 02/25/19 @ 10:30. She was placed on the cancellation list for something sooner if possible.

## 2018-12-29 NOTE — Telephone Encounter (Signed)
Pt scheduled with driver for [id number]. Pt has been added to the waitlist.

## 2018-12-29 NOTE — Telephone Encounter (Signed)
Left message #1

## 2018-12-29 NOTE — Telephone Encounter (Signed)
Needs auth and scheduling for [id number]. 

## 2018-12-29 NOTE — Telephone Encounter (Signed)
[id number] Auth No / Request ID  Status Auto-Approved Decision Approved Effective Date 12/29/2018 Expiration Date 02/12/2019

## 2018-12-29 NOTE — Telephone Encounter (Signed)
yes

## 2018-12-30 ENCOUNTER — Other Ambulatory Visit: Payer: Self-pay

## 2018-12-30 ENCOUNTER — Ambulatory Visit: Payer: Medicare HMO | Admitting: Licensed Clinical Social Worker

## 2018-12-30 ENCOUNTER — Telehealth: Payer: Self-pay | Admitting: Licensed Clinical Social Worker

## 2018-12-30 ENCOUNTER — Ambulatory Visit
Admission: RE | Admit: 2018-12-30 | Discharge: 2018-12-30 | Disposition: A | Discharge status: Home / Self Care | Payer: Medicare HMO | Source: Ambulatory Visit | Attending: Family Medicine | Admitting: Family Medicine

## 2018-12-30 DIAGNOSIS — M48062 Spinal stenosis, lumbar region with neurogenic claudication: Secondary | ICD-10-CM

## 2018-12-30 NOTE — Telephone Encounter (Signed)
Patient was called for her scheduled appointment. Patient could not complete the appointment at that time, and requested to be R/S to 11/19 @ 11:30.

## 2018-12-31 ENCOUNTER — Ambulatory Visit (INDEPENDENT_AMBULATORY_CARE_PROVIDER_SITE_OTHER): Payer: Self-pay | Admitting: Sports Medicine

## 2018-12-31 ENCOUNTER — Telehealth: Payer: Self-pay | Admitting: Licensed Clinical Social Worker

## 2018-12-31 ENCOUNTER — Ambulatory Visit: Payer: Medicare HMO | Admitting: Licensed Clinical Social Worker

## 2018-12-31 ENCOUNTER — Encounter: Payer: Self-pay | Admitting: Sports Medicine

## 2018-12-31 VITALS — BP 160/106 | Ht 65.0 in | Wt 170.0 lb

## 2018-12-31 DIAGNOSIS — M48062 Spinal stenosis, lumbar region with neurogenic claudication: Secondary | ICD-10-CM

## 2018-12-31 MED ORDER — HYDROCODONE-ACETAMINOPHEN 10-325 MG PO TABS: 1.0000 | ORAL_TABLET | Freq: Three times a day (TID) | ORAL | 0 refills | Status: DC | PRN

## 2018-12-31 NOTE — Telephone Encounter (Signed)
Patient was called twice for her scheduled visit. Patient did not answer either call, and a vm was left for the patient.  

## 2019-01-01 ENCOUNTER — Ambulatory Visit: Payer: Medicare HMO

## 2019-01-01 NOTE — Progress Notes (Signed)
[name], female   DOB: Nov 03, 1950, 68 y.o.   MRN: [id number]  Olivia Ewing comes in today to discuss MRI findings of her lumbar spine.  MRI was ordered by Dr. Nori Riis.  Dominant finding is significant central and foraminal stenosis at L4-L5.  Foraminal stenosis is quite severe.  This MRI was compared to an MRI done in April 2019 and while there has been only some slight progression since that exam the patient's pain is becoming more and more intractable.  She has an appointment for a repeat lumbar ESI with Dr. Ernestina Patches in mid December and has a surgical consultation with Dr. Louanne Skye at the beginning of January.  I explained to Olivia Ewing that I think she is going to ultimately need a surgical decompression.  She understands this.  She is asking for a refill on her hydrocodone to get her to December 14 which is when she is scheduled for her ESI.  I have agreed to give her 10 additional pills of her hydrocodone and she understands that she will need to use these sparingly.  She will see Dr. Louanne Skye as scheduled and will follow up with [name] as needed.

## 2019-01-04 ENCOUNTER — Ambulatory Visit (INDEPENDENT_AMBULATORY_CARE_PROVIDER_SITE_OTHER): Payer: Medicare HMO

## 2019-01-04 ENCOUNTER — Ambulatory Visit: Payer: Medicare HMO

## 2019-01-04 ENCOUNTER — Encounter: Payer: Self-pay | Admitting: Specialist

## 2019-01-04 ENCOUNTER — Encounter: Payer: Self-pay | Admitting: Internal Medicine

## 2019-01-04 ENCOUNTER — Other Ambulatory Visit: Payer: Self-pay

## 2019-01-04 ENCOUNTER — Ambulatory Visit (INDEPENDENT_AMBULATORY_CARE_PROVIDER_SITE_OTHER): Payer: Medicare HMO | Admitting: Specialist

## 2019-01-04 VITALS — BP 163/105 | HR 87 | Ht 65.0 in | Wt 189.0 lb

## 2019-01-04 DIAGNOSIS — M5416 Radiculopathy, lumbar region: Secondary | ICD-10-CM

## 2019-01-04 DIAGNOSIS — M5136 Other intervertebral disc degeneration, lumbar region: Secondary | ICD-10-CM | POA: Diagnosis not present

## 2019-01-04 DIAGNOSIS — M48062 Spinal stenosis, lumbar region with neurogenic claudication: Secondary | ICD-10-CM

## 2019-01-04 DIAGNOSIS — G5603 Carpal tunnel syndrome, bilateral upper limbs: Secondary | ICD-10-CM | POA: Diagnosis not present

## 2019-01-04 MED ORDER — HYDROCODONE-ACETAMINOPHEN 7.5-325 MG PO TABS: 1.0000 | ORAL_TABLET | Freq: Four times a day (QID) | ORAL | 0 refills | Status: DC | PRN

## 2019-01-04 MED ORDER — DICLOFENAC SODIUM 75 MG PO TBEC: 75.0000 mg | DELAYED_RELEASE_TABLET | Freq: Two times a day (BID) | ORAL | 2 refills | Status: DC
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
## 2019-01-04 NOTE — Patient Instructions (Addendum)
Avoid bending, stooping and avoid lifting weights greater than 10 lbs. Avoid prolong standing and walking. Order for a new walker with wheels. Surgery scheduling secretary [name], will call you in the next week to schedule for surgery.  Surgery recommended is a three level lumbar laminectomies L2-3, L3-4 and L4-5, this would be done with microscope. Take hydrocodone for for pain. Risk of surgery includes risk of infection 1 in 200 patients, bleeding 1/2% chance you would need a transfusion.   Risk to the nerves is one in 10,000. Expect improved walking and standing tolerance. Expect relief of leg pain but numbness may persist depending on the length and degree of pressure that has been present.  Carpal Tunnel Syndrome  Carpal tunnel syndrome is a condition that causes pain in your hand and arm. The carpal tunnel is a narrow area located on the palm side of your wrist. Repeated wrist motion or certain diseases may cause swelling within the tunnel. This swelling pinches the main nerve in the wrist (median nerve). What are the causes? This condition may be caused by:  Repeated wrist motions.  Wrist injuries.  Arthritis.  A cyst or tumor in the carpal tunnel.  Fluid buildup during pregnancy. Sometimes the cause of this condition is not known. What increases the risk? This condition is more likely to develop in:  People who have jobs that cause them to repeatedly move their wrists in the same motion, such as Art gallery manager.  Women.  People with certain conditions, such as: ? Diabetes. ? Obesity. ? An underactive thyroid (hypothyroidism). ? Kidney failure. What are the signs or symptoms? Symptoms of this condition include:  A tingling feeling in your fingers, especially in your thumb, index, and middle fingers.  Tingling or numbness in your hand.  An aching feeling in your entire arm, especially when your wrist and elbow are bent for long periods of  time.  Wrist pain that goes up your arm to your shoulder.  Pain that goes down into your palm or fingers.  A weak feeling in your hands. You may have trouble grabbing and holding items. Your symptoms may feel worse during the night. How is this diagnosed? This condition is diagnosed with a medical history and physical exam. You may also have tests, including:  An electromyogram (EMG). This test measures electrical signals sent by your nerves into the muscles.  X-rays. How is this treated? Treatment for this condition includes:  Lifestyle changes. It is important to stop doing or modify the activity that caused your condition.  Physical or occupational therapy.  Medicines for pain and inflammation. This may include medicine that is injected into your wrist.  A wrist splint.  Surgery. Follow these instructions at home: If you have a splint:   Wear it as told by your health care provider. Remove it only as told by your health care provider.  Loosen the splint if your fingers become numb and tingle, or if they turn cold and blue.  Keep the splint clean and dry. General instructions   Take over-the-counter and prescription medicines only as told by your health care provider.  Rest your wrist from any activity that may be causing your pain. If your condition is work related, talk to your employer about changes that can be made, such as getting a wrist pad to use while typing.  If directed, apply ice to the painful area: ? Put ice in a plastic bag. ? Place a towel between your skin and  the bag. ? Leave the ice on for 20 minutes, 2-3 times per day.  Keep all follow-up visits as told by your health care provider. This is important.  Do any exercises as told by your health care provider, physical therapist, or occupational therapist. Contact a health care provider if:  You have new symptoms.  Your pain is not controlled with medicines.  Your symptoms get worse. This  information is not intended to replace advice given to you by your health care provider. Make sure you discuss any questions you have with your health care provider. Document Released: 01/26/2000 Document Revised: 06/08/2015 Document Reviewed: 10/09/2016 Elsevier Interactive Patient Education  2017 [employer].

## 2019-01-14 ENCOUNTER — Ambulatory Visit: Payer: Medicare HMO

## 2019-01-14 ENCOUNTER — Encounter: Payer: Self-pay | Admitting: Internal Medicine

## 2019-01-15 ENCOUNTER — Encounter: Payer: Self-pay | Admitting: Internal Medicine

## 2019-01-25 ENCOUNTER — Encounter: Payer: Medicare HMO | Admitting: Physical Medicine and Rehabilitation

## 2019-01-26 ENCOUNTER — Ambulatory Visit: Payer: Medicare HMO

## 2019-01-26 ENCOUNTER — Ambulatory Visit (INDEPENDENT_AMBULATORY_CARE_PROVIDER_SITE_OTHER): Payer: Medicare HMO | Admitting: Physical Medicine and Rehabilitation

## 2019-01-26 ENCOUNTER — Other Ambulatory Visit: Payer: Self-pay

## 2019-01-26 ENCOUNTER — Encounter: Payer: Self-pay | Admitting: Physical Medicine and Rehabilitation

## 2019-01-26 ENCOUNTER — Ambulatory Visit: Payer: Self-pay

## 2019-01-26 VITALS — BP 149/97 | HR 78

## 2019-01-26 DIAGNOSIS — M48061 Spinal stenosis, lumbar region without neurogenic claudication: Secondary | ICD-10-CM | POA: Diagnosis not present

## 2019-01-26 DIAGNOSIS — M5416 Radiculopathy, lumbar region: Secondary | ICD-10-CM | POA: Diagnosis not present

## 2019-01-26 MED ORDER — BETAMETHASONE SOD PHOS & ACET 6 (3-3) MG/ML IJ SUSP
12.0000 mg | Freq: Once | INTRAMUSCULAR | Status: AC
  Administered 2019-01-26: 11:00:00 12 mg

## 2019-01-26 NOTE — Progress Notes (Signed)
Numeric Pain Rating Scale and Functional Assessment Average Pain 10   In the last MONTH (on 0-10 scale) has pain interfered with the following?  1. General activity like being  able to carry out your everyday physical activities such as walking, climbing stairs, carrying groceries, or moving a chair?  Rating(10)   +Driver, -BT, -Dye Allergies. 

## 2019-01-27 ENCOUNTER — Encounter: Payer: Self-pay | Admitting: Internal Medicine

## 2019-01-27 ENCOUNTER — Ambulatory Visit (INDEPENDENT_AMBULATORY_CARE_PROVIDER_SITE_OTHER): Payer: Medicare HMO | Admitting: Internal Medicine

## 2019-01-27 VITALS — BP 147/96 | HR 107 | Temp 98.6°F | Ht 65.0 in | Wt 205.5 lb

## 2019-01-27 DIAGNOSIS — I10 Essential (primary) hypertension: Secondary | ICD-10-CM

## 2019-01-27 DIAGNOSIS — Z23 Encounter for immunization: Secondary | ICD-10-CM

## 2019-01-27 DIAGNOSIS — Z79899 Other long term (current) drug therapy: Secondary | ICD-10-CM | POA: Diagnosis not present

## 2019-01-27 DIAGNOSIS — E785 Hyperlipidemia, unspecified: Secondary | ICD-10-CM | POA: Diagnosis not present

## 2019-01-27 DIAGNOSIS — D649 Anemia, unspecified: Secondary | ICD-10-CM

## 2019-01-27 NOTE — Assessment & Plan Note (Signed)
Patient's blood pressure during this visit was 147/96. The patient is currently being prescribed amlodipine 5mg  qd and lisinopril 20-12.5mg  qd. She states that she misses doses occasionally. Patient's last bmp March 2020 which did not show any electrolyte or kidney dysfunction. Patient has gained 16lbs in the past 1 month.   Assessment and plan  Recommended patient be more adherent to her blood pressure regimen.   -counseled regarding healthy lifestyle  -continue current regimen

## 2019-01-27 NOTE — Patient Instructions (Signed)
It was a pleasure to see you today Olivia Ewing. Please make the following changes:  -please continue taking all of your medication indicated on this form  -please follow up with gynecology regarding your colposcopy  If you have any questions or concerns, please call our clinic at [phone] between 9am-5pm and after hours call [phone] and ask for the internal medicine resident on call. If you feel you are having a medical emergency please call 911.   Thank you, we look forward to help you remain healthy!  Lars Mage, MD Internal Medicine PGY3

## 2019-01-27 NOTE — Assessment & Plan Note (Signed)
The patient has a low hemoglobin over the past couple of years. Last CBC in March 2020 showed normocytic anemia without any elevation in rdw. Patient was given iron repletion previously.   Will recheck iron panel and cbc. Patient had to leave, but said she will return tomorrow 12/17 for lab draw.

## 2019-01-27 NOTE — Progress Notes (Signed)
   CC: Hypertension follow up  HPI:  [name] is a 68 y.o. with essential htn, depression who is here for blood pressure follow up. Please see problem based charting for evaluation, assessment, and plan.  Past Medical History:  Diagnosis Date  . Anemia   . Arthritis    knees hands  . Bipolar disorder ([place])   . Cervicalgia   . Depression   . Environmental allergies    cause SOB, uses inhaler for  . Environmental and seasonal allergies    uses inhaler prn  . Full dentures   . GERD (gastroesophageal reflux disease)    diet controlled - no meds  . Hyperlipidemia    diet controlled, no meds  . Hypertension   . Lumbar radiculopathy, chronic   . Right knee pain    posterior horn medial meniscal tear MRI 2013  . Spinal stenosis    getting epidural injections -last one 06/12/2015  . SVD (spontaneous vaginal delivery)    x 4   Review of Systems:    Dypsnea due to aunt smoking and her pets   Denies nausea, vomiting, chest pain   Physical Exam:  Vitals:   01/27/19 1459  BP: (!) 147/96  Pulse: (!) 107  Temp: 98.6 F (37 C)  TempSrc: Oral  SpO2: 100%  Weight: 205 lb 8 oz (93.2 kg)  Height: 5\' 5"  (1.651 m)   Physical Exam  Constitutional: Appears well-developed and well-nourished. No distress.  HENT:  Head: Normocephalic and atraumatic.  Eyes: Conjunctivae are normal.  Cardiovascular: Normal rate, regular rhythm and normal heart sounds.  Respiratory: Effort normal and breath sounds normal. No respiratory distress. No wheezes.  GI: Soft. Bowel sounds are normal. No distension. There is no tenderness.  Musculoskeletal: No edema.  Neurological: Is alert.  Skin: Not diaphoretic. No erythema.  Psychiatric: Normal mood and affect. Behavior is normal. Judgment and thought content normal.   Assessment & Plan:   See Encounters Tab for problem based charting.  Patient discussed with Dr. Heber McLaughlin

## 2019-01-27 NOTE — Assessment & Plan Note (Signed)
The patient's last lipid panel February 2014 shows tcholes 278, ldl 194. Will repeat lipid panel.

## 2019-01-28 ENCOUNTER — Telehealth: Payer: Self-pay | Admitting: Licensed Clinical Social Worker

## 2019-01-28 ENCOUNTER — Ambulatory Visit: Payer: Medicare HMO | Admitting: Sports Medicine

## 2019-01-28 ENCOUNTER — Ambulatory Visit: Payer: Medicare HMO | Admitting: Licensed Clinical Social Worker

## 2019-01-28 ENCOUNTER — Other Ambulatory Visit: Payer: Self-pay | Admitting: Internal Medicine

## 2019-01-28 NOTE — Telephone Encounter (Signed)
Pt is calling back, please contact pt

## 2019-01-28 NOTE — Telephone Encounter (Signed)
Patient was called twice for her session. Patient did not answer either call. A vm was left for the patient to reschedule.

## 2019-01-28 NOTE — Telephone Encounter (Signed)
Pls contact pt [phone], pt was told yesterday that she had some refills on albuterol (PROVENTIL HFA;VENTOLIN HFA) 108 (90 Base) MCG/ACT inhaler

## 2019-01-28 NOTE — Telephone Encounter (Signed)
Patient called back. Requesting refill at [facility] and all other pharmacies to be removed. States she is completely out of albuterol and hopes refill can be sent today. Hubbard Hartshorn, BSN, RN-BC

## 2019-01-28 NOTE — Telephone Encounter (Signed)
Returned call to patient. No answer and no VM set up. Albuterol last written 01/02/2018 and No Print option was selected. Will forward refill request to PCP once patient returns call to verify correct pharmacy; there are 4 listed. Hubbard Hartshorn, BSN, RN-BC

## 2019-01-29 MED ORDER — ALBUTEROL SULFATE HFA 108 (90 BASE) MCG/ACT IN AERS: 1.0000 | INHALATION_SPRAY | Freq: Four times a day (QID) | RESPIRATORY_TRACT | 8 refills | Status: DC | PRN

## 2019-01-29 NOTE — Progress Notes (Signed)
Internal Medicine Clinic Attending  Case discussed with Dr. Chundi at the time of the visit.  We reviewed the resident's history and exam and pertinent patient test results.  I agree with the assessment, diagnosis, and plan of care documented in the resident's note. 

## 2019-02-02 ENCOUNTER — Other Ambulatory Visit: Payer: Self-pay

## 2019-02-02 ENCOUNTER — Ambulatory Visit (INDEPENDENT_AMBULATORY_CARE_PROVIDER_SITE_OTHER): Payer: Medicare HMO | Admitting: Sports Medicine

## 2019-02-02 ENCOUNTER — Other Ambulatory Visit: Payer: Medicare HMO

## 2019-02-02 ENCOUNTER — Other Ambulatory Visit: Payer: Self-pay | Admitting: Internal Medicine

## 2019-02-02 VITALS — BP 148/88 | Ht 65.0 in | Wt 180.0 lb

## 2019-02-02 DIAGNOSIS — I10 Essential (primary) hypertension: Secondary | ICD-10-CM

## 2019-02-02 DIAGNOSIS — M79601 Pain in right arm: Secondary | ICD-10-CM

## 2019-02-02 MED ORDER — DIAZEPAM 5 MG PO TABS: ORAL_TABLET | ORAL | 0 refills | Status: DC

## 2019-02-02 NOTE — Progress Notes (Signed)
   Subjective:    [name], female    DOB: [birth year], 68 y.o.   MRN: [id number]  HPI   Patient comes in with persistent right shoulder pain.  She was injured several weeks ago when a ceiling tile hit her right shoulder.  She has persistent in the scapular area with radiating pain into her arm.  Associated numbness and tingling along the radial aspect of her hand.  She does note diffuse shoulder pain with range of motion.  She also endorses weakness.  Previous x-rays of her shoulder and scapula showed no obvious fracture but showed some degenerative changes.   Review of Systems As above    Objective:   Physical Exam  Well-developed, well-nourished.  No acute distress.  Awake alert and oriented x3.  Vital signs reviewed.  Cervical spine: Good cervical range of motion.  She is tender to palpation along the right paraspinal musculature.  Right shoulder: Diffuse tenderness to palpation around the right scapula and rhomboids.  No obvious deformity.  Neurological exam: Patient does demonstrate some diffuse weakness of the right upper extremity but nothing focal.  No atrophy.       Assessment & Plan:   Persistent right-sided neck and shoulder pain  Is difficult to determine whether or not her pain is in her right shoulder or referred pain from her cervical spine.  Her weakness and complaint of numbness in her hand make me think it is probably more her cervical spine she certainly has pain and weakness with rotator cuff testing.  I would like to start with x-rays of her right scapula just to rule out an occult fracture that may not have been appreciated on her initial imaging.  We will also get a cervical spine series.  I think she will ultimately need an MRI of both the cervical spine and her right shoulder to better delineate pathology.  Once those studies have been completed then we can delineate a more definitive treatment plan.   Of note, patient was requesting some  medicine to help her deal with some anxiety that she is experiencing secondary to some family issues.  She has been prescribed Valium in the past and has done well with that.  I agreed to give her 4 pills total to take sparingly.  She understands that this is not a medication that we will continue to refill.

## 2019-02-03 ENCOUNTER — Telehealth: Payer: Self-pay | Admitting: Physical Medicine and Rehabilitation

## 2019-02-03 NOTE — Procedures (Signed)
Lumbosacral Transforaminal Epidural Steroid Injection - Sub-Pedicular Approach with Fluoroscopic Guidance  Patient: Olivia Ewing      Date of Birth: Jun 23, 1950 MRN: [id number] PCP: Katherine Roan, MD      Visit Date: 01/26/2019   Universal Protocol:    Date/Time: 01/26/2019  Consent Given By: the patient  Position: PRONE  Additional Comments: Vital signs were monitored before and after the procedure. Patient was prepped and draped in the usual sterile fashion. The correct patient, procedure, and site was verified.   Injection Procedure Details:  Procedure Site One Meds Administered:  Meds ordered this encounter  Medications  . betamethasone acetate-betamethasone sodium phosphate (CELESTONE) injection 12 mg    Laterality: Bilateral  Location/Site:  L4-L5  Needle size: 22 G  Needle type: Spinal  Needle Placement: Transforaminal  Findings:    -Comments: Excellent flow of contrast along the nerve and into the epidural space.  Procedure Details: After squaring off the end-plates to get a true AP view, the C-arm was positioned so that an oblique view of the foramen as noted above was visualized. The target area is just inferior to the "nose of the scotty dog" or sub pedicular. The soft tissues overlying this structure were infiltrated with 2-3 ml. of 1% Lidocaine without Epinephrine.  The spinal needle was inserted toward the target using a "trajectory" view along the fluoroscope beam.  Under AP and lateral visualization, the needle was advanced so it did not puncture dura and was located close the 6 O'Clock position of the pedical in AP tracterory. Biplanar projections were used to confirm position. Aspiration was confirmed to be negative for CSF and/or blood. A 1-2 ml. volume of Isovue-250 was injected and flow of contrast was noted at each level. Radiographs were obtained for documentation purposes.   After attaining the desired flow of contrast documented above,  a 0.5 to 1.0 ml test dose of 0.25% Marcaine was injected into each respective transforaminal space.  The patient was observed for 90 seconds post injection.  After no sensory deficits were reported, and normal lower extremity motor function was noted,   the above injectate was administered so that equal amounts of the injectate were placed at each foramen (level) into the transforaminal epidural space.   Additional Comments:  The patient tolerated the procedure well Dressing: 2 x 2 sterile gauze and Band-Aid    Post-procedure details: Patient was observed during the procedure. Post-procedure instructions were reviewed.  Patient left the clinic in stable condition.

## 2019-02-03 NOTE — Progress Notes (Signed)
Olivia Ewing - 68 y.o. female MRN [id number]  Date of birth: Jan 17, 1951  Office Visit Note: Visit Date: 01/26/2019 PCP: Katherine Roan, MD Referred by: Katherine Roan, MD  Subjective: Chief Complaint  Patient presents with  . Lower Back - Pain  . Right Leg - Pain  . Left Thigh - Pain   HPI:  Olivia Ewing is a 68 y.o. female who comes in today At the request of Dr. Zachery Dakins for bilateral L4 transforaminal epidural steroid injection.  Patient's had prior injection 1 time by myself which is an interlaminar approach that helped a little bit of a degree.  She also has had epidural injections in the past with interlaminar approach at [facility].  Since have seen her last she has had updated MRI of the lumbar spine which is reviewed with the patient reviewed below.  Continued problems at L4-5 with multifactorial stenosis with central canal measuring only 4 mm with disc protrusion and facet arthropathy.  We will complete bilateral L4 transforaminal injection and see how she does.  She will continue follow-up with Dr. Louanne Skye.  ROS Otherwise per HPI.  Assessment & Plan: Visit Diagnoses:  1. Lumbar radiculopathy   2. Spinal stenosis of lumbar region, unspecified whether neurogenic claudication present     Plan: No additional findings.   Meds & Orders:  Meds ordered this encounter  Medications  . betamethasone acetate-betamethasone sodium phosphate (CELESTONE) injection 12 mg    Orders Placed This Encounter  Procedures  . XR C-ARM NO REPORT  . Epidural Steroid injection    Follow-up: Return for visit to requesting physician as needed.   Procedures: No procedures performed  Lumbosacral Transforaminal Epidural Steroid Injection - Sub-Pedicular Approach with Fluoroscopic Guidance  Patient: Olivia Ewing      Date of Birth: September 08, 1950 MRN: [id number] PCP: Katherine Roan, MD      Visit Date: 01/26/2019   Universal Protocol:    Date/Time:  01/26/2019  Consent Given By: the patient  Position: PRONE  Additional Comments: Vital signs were monitored before and after the procedure. Patient was prepped and draped in the usual sterile fashion. The correct patient, procedure, and site was verified.   Injection Procedure Details:  Procedure Site One Meds Administered:  Meds ordered this encounter  Medications  . betamethasone acetate-betamethasone sodium phosphate (CELESTONE) injection 12 mg    Laterality: Bilateral  Location/Site:  L4-L5  Needle size: 22 G  Needle type: Spinal  Needle Placement: Transforaminal  Findings:    -Comments: Excellent flow of contrast along the nerve and into the epidural space.  Procedure Details: After squaring off the end-plates to get a true AP view, the C-arm was positioned so that an oblique view of the foramen as noted above was visualized. The target area is just inferior to the "nose of the scotty dog" or sub pedicular. The soft tissues overlying this structure were infiltrated with 2-3 ml. of 1% Lidocaine without Epinephrine.  The spinal needle was inserted toward the target using a "trajectory" view along the fluoroscope beam.  Under AP and lateral visualization, the needle was advanced so it did not puncture dura and was located close the 6 O'Clock position of the pedical in AP tracterory. Biplanar projections were used to confirm position. Aspiration was confirmed to be negative for CSF and/or blood. A 1-2 ml. volume of Isovue-250 was injected and flow of contrast was noted at each level. Radiographs were obtained for documentation purposes.   After attaining  the desired flow of contrast documented above, a 0.5 to 1.0 ml test dose of 0.25% Marcaine was injected into each respective transforaminal space.  The patient was observed for 90 seconds post injection.  After no sensory deficits were reported, and normal lower extremity motor function was noted,   the above injectate was  administered so that equal amounts of the injectate were placed at each foramen (level) into the transforaminal epidural space.   Additional Comments:  The patient tolerated the procedure well Dressing: 2 x 2 sterile gauze and Band-Aid    Post-procedure details: Patient was observed during the procedure. Post-procedure instructions were reviewed.  Patient left the clinic in stable condition.     Clinical History: [facility] LUMBAR SPINE WITHOUT CONTRAST  TECHNIQUE: Multiplanar, multisequence MR imaging of the lumbar spine was performed. No intravenous contrast was administered.  COMPARISON:  May 19, 2017  FINDINGS: Segmentation: There are 5 non-rib bearing lumbar type vertebral bodies with the last intervertebral disc space labeled as L5-S1.  Alignment: There is straightening of the normal lumbar lordosis. Again noted is a mild rightward curvature of the lumbar spine. There is a minimal retrolisthesis of L3 on L4 and L4 on L5.  Vertebrae: The vertebral body heights are well maintained. No fracture, marrow edema,or pathologic marrow infiltration. Increased STIR signal is seen at the endplates of [id number] there is minimally increased STIR signal seen at L2-L3.  Conus medullaris and cauda equina: Conus extends to the L1 level. Conus and cauda equina appear normal.  Paraspinal and other soft tissues: T2 bright cystic lesion is again noted within the left kidney. The paraspinal soft tissues and visualized retroperitoneal structures are unremarkable. The sacroiliac joints are intact.  Disc levels:  T12-L1:  No significant canal or neural foraminal narrowing.  L1-L2: There is a broad-based disc bulge with a left paracentral disc protrusion, facet arthrosis and ligamentum flavum hypertrophy. The disc protrusion contacts the descending left L2 nerve root with impingement. There is moderate to severe left and moderate right neural foraminal narrowing. The central thecal  sac measures 8 mm in AP diameter.  L2-L3: There is a broad-based disc bulge with ligamentum flavum hypertrophy facet arthrosis which causes moderate bilateral neural foraminal narrowing. The central thecal measures 8 mm in AP diameter.  L3-L4: There is a broad-based disc bulge with a right far lateral disc protrusion. Ligamentum flavum hypertrophy and facet arthrosis is present. Moderate to severe neural foraminal narrowing. The central thecal measures 7 mm in AP diameter.  L4-L5: There is a broad-based disc bulge with a central disc protrusion which contacts and impinges the bilateral descending L5 nerve roots. There is also ligamentum flavum hypertrophy and facet arthrosis. There is severe bilateral neural foraminal narrowing. Central thecal sac effaced measuring 4 mm in AP diameter.  L5-S1: There is a broad-based disc bulge with a central disc protrusion facet arthrosis and ligamentum flavum hypertrophy. Moderate bilateral neural foraminal narrowing is seen, right greater than left. There is mild effacement anterior thecal.  IMPRESSION: 1. Minimal retrolisthesis of L3 on L4 and L4 on L5. 2. 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 There is also severe bilateral neural foraminal narrowing and central canal stenosis. This is slightly progressed since the prior exam.   Electronically Signed   By: Prudencio Pair M.D.   On: 12/30/2018 20:20     Objective:  VS:  HT:    WT:   BMI:  BP:(!) 149/97  HR:78bpm  TEMP: ( )  RESP:  Physical Exam  Ortho Exam Imaging: No results found.

## 2019-02-08 ENCOUNTER — Other Ambulatory Visit: Payer: Medicare HMO

## 2019-02-09 ENCOUNTER — Other Ambulatory Visit: Payer: Medicare HMO

## 2019-02-23 ENCOUNTER — Other Ambulatory Visit: Payer: Self-pay

## 2019-02-23 ENCOUNTER — Ambulatory Visit (INDEPENDENT_AMBULATORY_CARE_PROVIDER_SITE_OTHER): Payer: Medicare HMO | Admitting: Licensed Clinical Social Worker

## 2019-02-23 DIAGNOSIS — F430 Acute stress reaction: Secondary | ICD-10-CM

## 2019-02-23 NOTE — BH Specialist Note (Signed)
Integrated Behavioral Health Follow Up Visit  MRN: [id number] Name: Olivia Ewing  Session Start time: 2:10  Session End time: 3:00 Total time: 50  minutes  Type of Service: [place]- Individual Interpretor:No.   SUBJECTIVE: Olivia Ewing is a 69 y.o. female  whom attended the session individually.  Patient was referred by Dr. Shan Levans for acute stress. Patient reports the following symptoms/concerns: panic, acute stress that has transitioned to anxiety, grief, anger, sleep issues, and financial challenges.  Duration of problem: Since July 2020; Severity of problem: moderate  OBJECTIVE: Mood: Anxious and Depressed and Affect: Tearful Risk of harm to self or others: No plan to harm self or others  GOALS ADDRESSED: Patient will: 1.  Reduce symptoms of: agitation, anxiety, insomnia, stress and grief.  2.  Increase knowledge and/or ability of: coping skills, healthy habits and stress reduction  3.  Demonstrate ability to: Increase healthy adjustment to current life circumstances, Increase adequate support systems for patient/family and Begin healthy grieving over loss  INTERVENTIONS: Interventions utilized:  Mindfulness or Psychologist, educational, Brief CBT, Supportive Counseling, Medication Monitoring, Sleep Hygiene and Link to [employer] Standardized Assessments completed: assessed for SI, HI, and self-harm.  ASSESSMENT: Patient currently experiencing moderate levels of anxiety and grief reactions. Patient is reporting having ongoing challenges with difficulty falling asleep and remaining asleep for more than three hours. Patient was provided with a psychiatrist that accepts her insurance. Patient also knows that she can go back to [place] as another option, but does not feel this is an appropriate fit for her. Patient processed interpersonal issues that lead to increased anxiety for her. Patient identified ways that she can set healthy limits with others to  reduce negative feelings.    Patient may benefit from counseling.  PLAN: 1. Follow up with behavioral health clinician on : two weeks.  2. Referral(s): Psychiatrist. Patient was provided with local psychiatrist information.   Dessie Coma, [facility], [place]

## 2019-02-23 NOTE — Telephone Encounter (Signed)
Closing encounter

## 2019-02-24 ENCOUNTER — Encounter: Payer: Self-pay | Admitting: Licensed Clinical Social Worker

## 2019-02-25 ENCOUNTER — Ambulatory Visit: Payer: Medicare HMO | Admitting: Specialist

## 2019-02-25 ENCOUNTER — Other Ambulatory Visit: Payer: Self-pay

## 2019-02-25 ENCOUNTER — Ambulatory Visit (INDEPENDENT_AMBULATORY_CARE_PROVIDER_SITE_OTHER): Payer: Medicare HMO | Admitting: Specialist

## 2019-02-25 ENCOUNTER — Encounter: Payer: Self-pay | Admitting: Specialist

## 2019-02-25 VITALS — BP 142/93 | HR 76 | Ht 65.0 in | Wt 189.0 lb

## 2019-02-25 DIAGNOSIS — M48062 Spinal stenosis, lumbar region with neurogenic claudication: Secondary | ICD-10-CM | POA: Diagnosis not present

## 2019-02-25 DIAGNOSIS — M5136 Other intervertebral disc degeneration, lumbar region: Secondary | ICD-10-CM | POA: Diagnosis not present

## 2019-02-25 DIAGNOSIS — M5126 Other intervertebral disc displacement, lumbar region: Secondary | ICD-10-CM

## 2019-02-25 DIAGNOSIS — M5416 Radiculopathy, lumbar region: Secondary | ICD-10-CM | POA: Diagnosis not present

## 2019-02-25 MED ORDER — HYDROCODONE-ACETAMINOPHEN 7.5-325 MG PO TABS: 1.0000 | ORAL_TABLET | Freq: Four times a day (QID) | ORAL | 0 refills | Status: DC | PRN

## 2019-02-25 NOTE — Patient Instructions (Signed)
Avoid bending, stooping and avoid lifting weights greater than 10 lbs. Avoid prolong standing and walking. Avoid frequent bending and stooping  No lifting greater than 10 lbs. May use ice or moist heat for pain. Weight loss is of benefit. Handicap license is approved. Avoid bending, stooping and avoid lifting weights greater than 10 lbs. Avoid prolong standing and walking. Order for a new walker with wheels. Surgery scheduling secretary [name], will call you in the next week to schedule for surgery.   Surgery recommended is a two level lumbar laminectomies L3-4 and L4-5 this would be done with the microscope. Take hydrocodone for for pain. Risk of surgery includes risk of infection 1 in 200 patients, bleeding 1/2% chance you would need a transfusion.   Risk to the nerves is one in 10,000.  Expect improved walking and standing tolerance. Expect relief of leg pain but numbness may persist depending on the length and degree of pressure that has been present.

## 2019-02-25 NOTE — Progress Notes (Signed)
Office Visit Note   Patient: Olivia Ewing           Date of Birth: Nov 19, 1950           MRN: [id number] Visit Date: 02/25/2019              Requested by: Katherine Roan, MD [place],  [place] [address] PCP: Katherine Roan, MD   Assessment & Plan: Visit Diagnoses:  1. Spinal stenosis of lumbar region with neurogenic claudication   2. Lumbar radiculopathy   3. Degenerative disc disease, lumbar   4. Herniated lumbar intervertebral disc     Plan: Avoid bending, stooping and avoid lifting weights greater than 10 lbs. Avoid prolong standing and walking. Avoid frequent bending and stooping  No lifting greater than 10 lbs. May use ice or moist heat for pain. Weight loss is of benefit. Handicap license is approved. Avoid bending, stooping and avoid lifting weights greater than 10 lbs. Avoid prolong standing and walking. Order for a new walker with wheels. Surgery scheduling secretary [name], will call you in the next week to schedule for surgery.   Surgery recommended is a two level lumbar laminectomies L3-4 and L4-5 this would be done with the microscope. Take hydrocodone for for pain. Risk of surgery includes risk of infection 1 in 200 patients, bleeding 1/2% chance you would need a transfusion.   Risk to the nerves is one in 10,000.  Expect improved walking and standing tolerance. Expect relief of leg pain but numbness may persist depending on the length and degree of pressure that has been present.   Follow-Up Instructions: Return in about 4 weeks (around 03/25/2019).   Orders:  No orders of the defined types were placed in this encounter.  Meds ordered this encounter  Medications  . HYDROcodone-acetaminophen (NORCO) 7.5-325 MG tablet    Sig: Take 1 tablet by mouth every 6 (six) hours as needed for moderate pain.    Dispense:  30 tablet    Refill:  0      Procedures: No procedures performed   Clinical Data: No additional  findings.   Subjective: Chief Complaint  Patient presents with  . Lower Back - Follow-up    She had Bilat L4-5 TF with Dr. Ernestina Patches on 01/26/2019, she states that she had a lot of relief with this injection, able to stand and do things longer, pain returend x3 days ago.    69 year old female with history of back pain and has been seen by Dr. Ernestina Patches. Underwent recent ESIs bilateral L4-5 with improvement the back pain up until about 2-3 days ago. The injection was done 01/26/2019, 11/2018 and 09/2018. The most recent injections bilateral transforamenal L4-5 the previous were at L3-4 above the area of Most of the stenosis which is central to 4 mm at L4-5. No bowel or bladder difficulty. She is using a cane. She has weakness that has been present since before her  Hip replacement surgery.    Review of Systems  Constitutional: Negative.   HENT: Negative.   Eyes: Negative.   Respiratory: Negative.   Cardiovascular: Negative.   Gastrointestinal: Negative.   Endocrine: Negative.   Genitourinary: Negative.   Musculoskeletal: Negative.   Skin: Negative.   Allergic/Immunologic: Negative.   Neurological: Negative.   Hematological: Negative.   Psychiatric/Behavioral: Negative.      Objective: Vital Signs: BP (!) 142/93 (BP Location: Left Arm, Patient Position: Sitting)   Pulse 76   Ht 5'  5" (1.651 m)   Wt 189 lb (85.7 kg)   BMI 31.45 kg/m   Physical Exam Constitutional:      Appearance: She is well-developed.  HENT:     Head: Normocephalic and atraumatic.  Eyes:     Pupils: Pupils are equal, round, and reactive to light.  Pulmonary:     Effort: Pulmonary effort is normal.     Breath sounds: Normal breath sounds.  Abdominal:     General: Bowel sounds are normal.     Palpations: Abdomen is soft.  Musculoskeletal:     Cervical back: Normal range of motion and neck supple.     Lumbar back: Negative right straight leg raise test and negative left straight leg raise test.  Skin:     General: Skin is warm and dry.  Neurological:     Mental Status: She is alert and oriented to person, place, and time.  Psychiatric:        Behavior: Behavior normal.        Thought Content: Thought content normal.        Judgment: Judgment normal.     Back Exam   Tenderness  The patient is experiencing tenderness in the lumbar.  Range of Motion  Extension: abnormal  Flexion: abnormal  Lateral bend right: abnormal  Lateral bend left: abnormal  Rotation right: abnormal  Rotation left: abnormal   Muscle Strength  Right Quadriceps:  4/5  Left Quadriceps:  4/5  Right Hamstrings:  5/5  Left Hamstrings:  5/5   Tests  Straight leg raise right: negative Straight leg raise left: negative  Reflexes  Patellar: abnormal Achilles: abnormal  Other  Toe walk: normal Heel walk: normal Sensation: normal Gait: normal  Erythema: no back redness Scars: absent  Comments:  Bilateral knee extension weakness 4/5 and bilateral foot DF weakness 4/5      Specialty Comments:  No specialty comments available.  Imaging: No results found.   PMFS History: Patient Active Problem List   Diagnosis Date Noted  . Acute stress reaction 09/25/2018  . Primary osteoarthritis of right hip 04/27/2018  . Status post total hip replacement, 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  . Shortness of breath 01/02/2018  . Chalazion left upper eyelid 02/20/2017  . Vitamin D insufficiency 11/25/2013  . Healthcare maintenance 11/25/2013  . Spinal stenosis of lumbar region 07/02/2013  . Arthritis, degenerative 11/17/2012  . Baker's cyst of knee 06/05/2012  . Rectocele 04/28/2012  . Left knee DJD, degenerative meniscus tear 04/06/2012  . Numbness and tingling in hands 03/17/2012  . Periodontitis 09/23/2011  . Depression 09/23/2011  . Right knee meniscal tear 03/14/2011  . Lumbar radiculopathy 03/14/2011  . Stress incontinence of urine 10/18/2010  . NECK PAIN, CHRONIC 08/19/2008  . Normocytic anemia 05/12/2008  .  SHOULDER PAIN, RIGHT 04/28/2008  . Hyperlipemia 06/18/2007  . Essential hypertension 05/20/2007   Past Medical History:  Diagnosis Date  . Anemia   . Arthritis    knees hands  . Bipolar disorder ([place])   . Cervicalgia   . Depression   . Environmental allergies    cause SOB, uses inhaler for  . Environmental and seasonal allergies    uses inhaler prn  . Full dentures   . GERD (gastroesophageal reflux disease)    diet controlled - no meds  . Hyperlipidemia    diet controlled, no meds  . Hypertension   . Lumbar radiculopathy, chronic   . Right knee pain    posterior horn medial meniscal tear MRI 2013  .  Spinal stenosis    getting epidural injections -last one 06/12/2015  . SVD (spontaneous vaginal delivery)    x 4    Family History  Problem Relation Age of Onset  . Hypertension Mother     Past Surgical History:  Procedure Laterality Date  . APPENDECTOMY    . Bladder tack  10/2006   cystocoele  . BREAST SURGERY Right    benign cyst  . COLONOSCOPY    . Hysterectomy other    . TOTAL HIP ARTHROPLASTY Right 04/27/2018   Procedure: RIGHT TOTAL HIP ARTHROPLASTY ANTERIOR APPROACH;  Surgeon: Leandrew Koyanagi, MD;  Location: [facility];  Service: Orthopedics;  Laterality: Right;  . TUBAL LIGATION     Social History   Occupational History  . Not on file  Tobacco Use  . Smoking status: Former Smoker    Packs/day: 0.10    Quit date: 11/26/1983    Years since quitting: 35.2  . Smokeless tobacco: Never Used  Substance and Sexual Activity  . Alcohol use: Yes    Alcohol/week: 0.0 standard drinks    Comment: rarely  . Drug use: Not Currently    Types: Marijuana    Comment: last used 2019  . Sexual activity: Not Currently    Birth control/protection: Surgical

## 2019-03-04 ENCOUNTER — Ambulatory Visit
Admission: RE | Admit: 2019-03-04 | Discharge: 2019-03-04 | Disposition: A | Discharge status: Home / Self Care | Payer: Medicare HMO | Source: Ambulatory Visit | Attending: Sports Medicine | Admitting: Sports Medicine

## 2019-03-04 ENCOUNTER — Ambulatory Visit
Admission: RE | Admit: 2019-03-04 | Discharge: 2019-03-04 | Disposition: A | Discharge status: Home / Self Care | Payer: Self-pay | Source: Ambulatory Visit | Attending: Sports Medicine | Admitting: Sports Medicine

## 2019-03-04 ENCOUNTER — Other Ambulatory Visit: Payer: Self-pay

## 2019-03-04 DIAGNOSIS — M79601 Pain in right arm: Secondary | ICD-10-CM

## 2019-03-05 ENCOUNTER — Other Ambulatory Visit: Payer: Self-pay

## 2019-03-05 ENCOUNTER — Ambulatory Visit (INDEPENDENT_AMBULATORY_CARE_PROVIDER_SITE_OTHER): Payer: Medicare HMO | Admitting: Internal Medicine

## 2019-03-05 ENCOUNTER — Encounter: Payer: Self-pay | Admitting: Internal Medicine

## 2019-03-05 VITALS — BP 156/97 | HR 94 | Temp 98.5°F | Ht 65.0 in | Wt 203.5 lb

## 2019-03-05 DIAGNOSIS — F430 Acute stress reaction: Secondary | ICD-10-CM | POA: Diagnosis not present

## 2019-03-05 DIAGNOSIS — F329 Major depressive disorder, single episode, unspecified: Secondary | ICD-10-CM | POA: Diagnosis not present

## 2019-03-05 DIAGNOSIS — F32A Depression, unspecified: Secondary | ICD-10-CM

## 2019-03-05 MED ORDER — SERTRALINE HCL 50 MG PO TABS: 50.0000 mg | ORAL_TABLET | Freq: Every day | ORAL | 1 refills | Status: DC

## 2019-03-05 MED ORDER — DIAZEPAM 5 MG PO TABS: ORAL_TABLET | ORAL | 0 refills | Status: DC

## 2019-03-05 MED ORDER — BUSPIRONE HCL 10 MG PO TABS: 10.0000 mg | ORAL_TABLET | Freq: Two times a day (BID) | ORAL | 1 refills | Status: DC

## 2019-03-05 NOTE — Assessment & Plan Note (Signed)
Patient with known depression anxiety. Multiple life stressors over the past six months including the death of one of her brother, traumatic injury at the mall, and her son being on trial for 1st murder. She has started to have more panic attacks. She is afraid that they can occur at any point. She describes palpitations, shortness of breath, diaphoresis, and a feeling that she is gonna die. She stopped her fluoxetine approximately two months ago because she felt that it was not working. She called her psychiatrist to [facility] who sent her out Valium. She felt that she got great benefit out of this medication. She called back to get a refill and was told that her primary care doctor would need to fill it moving forward. She is not been started on any new chronic medications.  GAD-7 and PHQ-9 are both 16 today.  A/P: - Given five tablets of Valium 5 mg PRN for anxiety. Discussed that this is not a chronic medication and that we would not refill it again. - Start sertraline 50 mg once daily and buspirone 10 mg twice daily for chronic therapy of her anxiety/depression. - Follow-up in six weeks with her PCP for telehealth visit. - She will continue to follow with Miquel Dunn for counseling.

## 2019-03-05 NOTE — Progress Notes (Signed)
   CC: anxiety/depression  HPI:  [name] is a 69 y.o. female with PMHx listed below presenting for anxiety/depression. Please see the A&P for the status of the patient's chronic medical problems.  Past Medical History:  Diagnosis Date  . Anemia   . Arthritis    knees hands  . Bipolar disorder ([place])   . Cervicalgia   . Depression   . Environmental allergies    cause SOB, uses inhaler for  . Environmental and seasonal allergies    uses inhaler prn  . Full dentures   . GERD (gastroesophageal reflux disease)    diet controlled - no meds  . Hyperlipidemia    diet controlled, no meds  . Hypertension   . Lumbar radiculopathy, chronic   . Right knee pain    posterior horn medial meniscal tear MRI 2013  . Spinal stenosis    getting epidural injections -last one 06/12/2015  . SVD (spontaneous vaginal delivery)    x 4   Review of Systems:  Performed and all others negative.  Physical Exam: Vitals:   03/05/19 1046  BP: (!) 156/97  Pulse: 94  Temp: 98.5 F (36.9 C)  TempSrc: Oral  SpO2: 100%  Weight: 203 lb 8 oz (92.3 kg)  Height: 5\' 5"  (1.651 m)   General: Well nourished female in no acute distress Pulm: Good air movement with no wheezing or crackles  CV: RRR, no murmurs, no rubs   Assessment & Plan:   See Encounters Tab for problem based charting.  Patient discussed with Dr. Philipp Ovens

## 2019-03-05 NOTE — Patient Instructions (Signed)
Thank you for allowing [name] to provide your care. Today we are starting a couple new medications.  Please only take the Valium as needed for panic attacks. Understand that this is not a medication that we will continue to refill.  To treat your underlying depression/anxiety we are starting you on BuSpar 10 mg twice daily and sertraline 50 mg once daily. Please take these medications every day. They will be the ones that should continue for several months to help with her anxiety/depression.  Continue to follow up with Miquel Dunn for counseling. Please schedule a telehealth visit in 4 to 6 weeks with her primary care doctor.

## 2019-03-08 ENCOUNTER — Other Ambulatory Visit: Payer: Self-pay | Admitting: *Deleted

## 2019-03-08 DIAGNOSIS — M75111 Incomplete rotator cuff tear or rupture of right shoulder, not specified as traumatic: Secondary | ICD-10-CM

## 2019-03-08 DIAGNOSIS — M542 Cervicalgia: Secondary | ICD-10-CM

## 2019-03-09 ENCOUNTER — Other Ambulatory Visit: Payer: Self-pay

## 2019-03-09 ENCOUNTER — Telehealth: Payer: Self-pay | Admitting: Sports Medicine

## 2019-03-09 ENCOUNTER — Encounter: Payer: Self-pay | Admitting: Licensed Clinical Social Worker

## 2019-03-09 ENCOUNTER — Ambulatory Visit (INDEPENDENT_AMBULATORY_CARE_PROVIDER_SITE_OTHER): Payer: Medicare HMO | Admitting: Licensed Clinical Social Worker

## 2019-03-09 DIAGNOSIS — F430 Acute stress reaction: Secondary | ICD-10-CM

## 2019-03-09 NOTE — Telephone Encounter (Signed)
  Olivia Ewing was notified yesterday via telephone of MRI results of both the cervical spine and right shoulder.  Right shoulder shows significant rotator cuff tendinosis and a fairly extensive tear of the supraspinatus tendon.  Also a complete tear involving the upper fibers of the subscapularis tendon.  This will likely need surgical repair.  She is currently [facility] with [facility] Louanne Ewing where she is scheduled for an upcoming lumbar spine fusion.  I recommended that she continue her orthopedic surgical care for her shoulder at that office, perhaps with Dr. Marlou Ewing.  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  This may also be explaining some of her symptoms.  I have advised her to discuss these findings with Dr. Louanne Ewing. Follow-up with [name] as needed.

## 2019-03-09 NOTE — BH Specialist Note (Signed)
Integrated Behavioral Health Follow Up Visit  MRN: [id number] Name: Olivia Ewing  Session Start time: 2:10  Session End time: 2:40 Total time: 30 minutes  Type of Service: [place]- Individual Interpretor:No.   SUBJECTIVE: Olivia Ewing is a 69 y.o. female  whom attended the session individually. Patient was referred by Dr. Shan Levans for acute stress disorder. Patient reports the following symptoms/concerns: panic, acute stress that has transitioned to anxiety, history of trauma, mood instability, grief, anger, sleep challenges, and financial issues.  Duration of problem: Since July 2010; Severity of problem: moderate  OBJECTIVE: Mood: Depressed and Affect: Depressed and Tearful Risk of harm to self or others: No plan to harm self or others  LIFE CONTEXT: Self-Care: Patient is trying to improve her overall health by eating healthier, and has a goal to lose weight after her surgery.  Life Changes: Patient is having back surgery next week.   GOALS ADDRESSED: Patient will: 1.  Reduce symptoms of: agitation, anxiety, depression, insomnia, mood instability and stress  2.  Increase knowledge and/or ability of: coping skills, healthy habits and stress reduction  3.  Demonstrate ability to: Increase healthy adjustment to current life circumstances, Increase adequate support systems for patient/family and Begin healthy grieving over loss  INTERVENTIONS: Interventions utilized:  Brief CBT, Supportive Counseling, Medication Monitoring and Link to [employer] Standardized Assessments completed: assessed for SI, HI, and self-harm.  ASSESSMENT: Patient currently experiencing moderate levels of mood instability due to acute stress, depression, and history of trauma. Patient is having an upcoming surgery, and processed her struggles with chronic pain. Patient is hopeful about the surgery. Patient's medication was recently changed to reduce anxiety and depression  symptoms. Patient does plan on following up with the psychiatrist provided to her after the surgery. Patient was provided multiple psychiatrist options.  Patient processed life challenges that have caused her to be guarded, and increase sadness presently. Patient is continue to process the legal troubles her son is facing, and how this has impacted her.   Patient may benefit from counseling and medication managment.  PLAN: 1. Follow up with behavioral health clinician on : one week.  2. Referral(s): Provided psychiatrist information again to patient.    Olivia Ewing, [facility], [place]

## 2019-03-10 ENCOUNTER — Other Ambulatory Visit: Payer: Self-pay

## 2019-03-10 ENCOUNTER — Ambulatory Visit (INDEPENDENT_AMBULATORY_CARE_PROVIDER_SITE_OTHER): Payer: Medicare HMO | Admitting: Internal Medicine

## 2019-03-10 ENCOUNTER — Telehealth: Payer: Self-pay | Admitting: *Deleted

## 2019-03-10 ENCOUNTER — Ambulatory Visit (INDEPENDENT_AMBULATORY_CARE_PROVIDER_SITE_OTHER): Payer: Medicare HMO | Admitting: Surgery

## 2019-03-10 ENCOUNTER — Encounter: Payer: Self-pay | Admitting: Surgery

## 2019-03-10 VITALS — BP 130/87 | HR 81 | Temp 97.0°F | Ht 65.0 in | Wt 203.5 lb

## 2019-03-10 DIAGNOSIS — N3946 Mixed incontinence: Secondary | ICD-10-CM | POA: Diagnosis not present

## 2019-03-10 DIAGNOSIS — N393 Stress incontinence (female) (male): Secondary | ICD-10-CM

## 2019-03-10 DIAGNOSIS — M4800 Spinal stenosis, site unspecified: Secondary | ICD-10-CM | POA: Diagnosis not present

## 2019-03-10 DIAGNOSIS — M48062 Spinal stenosis, lumbar region with neurogenic claudication: Secondary | ICD-10-CM

## 2019-03-10 NOTE — Assessment & Plan Note (Signed)
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 She uses adult pull-ups and pads to protect from incontinence and a function out public. She states that she uses them multiple times a day. She is planning to have surgery for her spinal stenosis on February 2.  A/P: - Will refill incontinence supplies.

## 2019-03-10 NOTE — Progress Notes (Signed)
Internal Medicine Clinic Attending  Case discussed with Dr. Helberg at the time of the visit.  We reviewed the resident's history and exam and pertinent patient test results.  I agree with the assessment, diagnosis, and plan of care documented in the resident's note.    

## 2019-03-10 NOTE — Progress Notes (Signed)
   CC: Mixed urinary incontinence   This is a telephone encounter between Olivia Ewing and Olivia Ewing on 03/10/2019 for Mixed urinary incontinence. The visit was conducted with the patient located at home and [facility]. The patient's identity was confirmed using their DOB and current address. The patient has consented to being evaluated through a telephone encounter and understands the associated risks (an examination cannot be done and the patient may need to come in for an appointment) / benefits (allows the patient to remain at home, decreasing exposure to coronavirus). I personally spent 5 minutes on medical discussion.   HPI:  [name] is a 69 y.o. with PMH as below.   Please see A&P for assessment of the patient's acute and chronic medical conditions.   Past Medical History:  Diagnosis Date  . Anemia   . Arthritis    knees hands  . Bipolar disorder ([place])   . Cervicalgia   . Depression   . Environmental allergies    cause SOB, uses inhaler for  . Environmental and seasonal allergies    uses inhaler prn  . Full dentures   . GERD (gastroesophageal reflux disease)    diet controlled - no meds  . Hyperlipidemia    diet controlled, no meds  . Hypertension   . Lumbar radiculopathy, chronic   . Right knee pain    posterior horn medial meniscal tear MRI 2013  . Spinal stenosis    getting epidural injections -last one 06/12/2015  . SVD (spontaneous vaginal delivery)    x 4   Review of Systems:  Performed and all others negative.  Assessment & Plan:   See Encounters Tab for problem based charting.  Patient discussed with Dr. Dareen Piano

## 2019-03-10 NOTE — Progress Notes (Signed)
69 year old black female history of L2-L5 stenosis comes in for preop evaluation.  She continues to have ongoing symptoms that are unchanged from previous visit.  She is wanting to proceed with L2-L5 decompression as scheduled.  Today history and physical performed.  Review of systems negative.  Surgical procedure along with potential hospital stay discussed.  All questions answered.

## 2019-03-10 NOTE — Telephone Encounter (Signed)
Completed and signed order form and CMN faxed to [employer] at [phone]. Confirmation receipt received. Hubbard Hartshorn, BSN, RN-BC

## 2019-03-10 NOTE — Telephone Encounter (Signed)
Received order and CMN from [facility] for Incontinence Supplies. Patient last seen for this 02/25/2018. Appt made tfor today in [facility] for telehealth visit to discuss need for these supplies. Hubbard Hartshorn, BSN, RN-BC

## 2019-03-11 NOTE — Progress Notes (Signed)
[facility] [place] [address] Phone: [phone] Fax: [place], [place] [address] [place] [address] Phone: [phone] Fax: [phone]  [id number] [address], [place] [address] Phone: [phone] Fax: [phone]      Your procedure is scheduled on Tuesday, February 2nd, 2021.  Report to [facility] Main Entrance "A" at 7:30 A.M., and check in at the Admitting office.  Call this number if you have problems the morning of surgery:  [phone]  Call [phone] if you have any questions prior to your surgery date Monday-Friday 8am-4pm    Remember:  Do not eat or drink after midnight the night before your surgery    Take these medicines the morning of surgery with A SIP OF WATER :  Amlodipine (Norvasc) Buspirone (Buspar) Fluoxetine (Prozac) Gabapentin (Neurontin) Sertraline (Zoloft)  If needed, you may take: Albuterol (Ventolin) inhaler - bring with you the day of surgery Cyclobenzaprine (Flexeril) Diazepam (Valium) Hydrocodone-Acetaminophen (Norco) 7.5/325mg    Promethazine (Phenergan)  From this point forward, STOP taking any Aspirin (unless otherwise instructed by your surgeon), Aleve, Naproxen, Ibuprofen, Motrin, Advil, Goody's, BC's, all herbal medications, fish oil, and all vitamins.    The Morning of Surgery  Do not wear jewelry, make-up or nail polish.  Do not wear lotions, powders, or perfumes/colognes, or deodorant  Do not shave 48 hours prior to surgery.    Do not bring valuables to the hospital.  [facility] is not responsible for any belongings or valuables.  If you are a smoker, DO NOT Smoke 24 hours prior to surgery  If you wear a CPAP at night please bring your mask the morning of surgery   Remember that you must have someone to transport  you home after your surgery, and remain with you for 24 hours if you are discharged the same day.   Please bring cases for contacts, glasses, hearing aids, dentures or bridgework because it cannot be worn into surgery.    Leave your suitcase in the car.  After surgery it may be brought to your room.  For patients admitted to the hospital, discharge time will be determined by your treatment team.  Patients discharged the day of surgery will not be allowed to drive home.    Special instructions:   [facility]- Preparing For Surgery  Before surgery, you can play an important role. Because skin is not sterile, your skin needs to be as free of germs as possible. You can reduce the number of germs on your skin by washing with CHG (chlorahexidine gluconate) Soap before surgery.  CHG is an antiseptic cleaner which kills germs and bonds with the skin to continue killing germs even after washing.    Oral Hygiene is also important to reduce your risk of infection.  Remember - BRUSH YOUR TEETH THE MORNING OF SURGERY WITH YOUR REGULAR TOOTHPASTE  Please do not use if you have an allergy to CHG or antibacterial soaps. If your skin becomes reddened/irritated stop using the CHG.  Do not shave (including legs and underarms) for at least 48 hours prior to first CHG shower. It is OK to shave your face.  Please follow these instructions carefully.   1. Shower the NIGHT BEFORE SURGERY and the MORNING OF SURGERY with CHG Soap.   2. If you chose to wash your hair, wash your hair first as  usual with your normal shampoo.  3. After you shampoo, rinse your hair and body thoroughly to remove the shampoo.  4. Use CHG as you would any other liquid soap. You can apply CHG directly to the skin and wash gently with a scrungie or a clean washcloth.   5. Apply the CHG Soap to your body ONLY FROM THE NECK DOWN.  Do not use on open wounds or open sores. Avoid contact with your eyes, ears, mouth and genitals (private  parts). Wash Face and genitals (private parts)  with your normal soap.   6. Wash thoroughly, paying special attention to the area where your surgery will be performed.  7. Thoroughly rinse your body with warm water from the neck down.  8. DO NOT shower/wash with your normal soap after using and rinsing off the CHG Soap.  9. Pat yourself dry with a CLEAN TOWEL.  10. Wear CLEAN PAJAMAS to bed the night before surgery, wear comfortable clothes the morning of surgery  11. Place CLEAN SHEETS on your bed the night of your first shower and DO NOT SLEEP WITH PETS.    Day of Surgery:  Please shower the morning of surgery with the CHG soap Do not apply any deodorants/lotions. Please wear clean clothes to the hospital/surgery center.   Remember to brush your teeth WITH YOUR REGULAR TOOTHPASTE.   Please read over the following fact sheets that you were given.

## 2019-03-11 NOTE — Progress Notes (Signed)
Internal Medicine Clinic Attending  Case discussed with Dr. Helberg at the time of the visit.  We reviewed the resident's history and exam and pertinent patient test results.  I agree with the assessment, diagnosis, and plan of care documented in the resident's note.    

## 2019-03-12 ENCOUNTER — Encounter (HOSPITAL_COMMUNITY)
Admission: RE | Admit: 2019-03-12 | Discharge: 2019-03-12 | Disposition: A | Discharge status: Home / Self Care | Payer: Medicare HMO | Source: Ambulatory Visit | Attending: Specialist | Admitting: Specialist

## 2019-03-12 ENCOUNTER — Other Ambulatory Visit: Payer: Self-pay

## 2019-03-12 ENCOUNTER — Inpatient Hospital Stay (HOSPITAL_COMMUNITY): Admission: RE | Admit: 2019-03-12 | Payer: Medicare HMO | Source: Ambulatory Visit

## 2019-03-12 DIAGNOSIS — Z01818 Encounter for other preprocedural examination: Secondary | ICD-10-CM | POA: Diagnosis present

## 2019-03-12 DIAGNOSIS — M48061 Spinal stenosis, lumbar region without neurogenic claudication: Secondary | ICD-10-CM | POA: Diagnosis not present

## 2019-03-12 DIAGNOSIS — M5126 Other intervertebral disc displacement, lumbar region: Secondary | ICD-10-CM | POA: Insufficient documentation

## 2019-03-12 LAB — COMPREHENSIVE METABOLIC PANEL
ALT: 25 U/L (ref 0–44)
AST: 27 U/L (ref 15–41)
Albumin: 3.8 g/dL (ref 3.5–5.0)
Alkaline Phosphatase: 85 U/L (ref 38–126)
Anion gap: 10 (ref 5–15)
BUN: 5 mg/dL — ABNORMAL LOW (ref 8–23)
CO2: 28 mmol/L (ref 22–32)
Calcium: 9.7 mg/dL (ref 8.9–10.3)
Chloride: 103 mmol/L (ref 98–111)
Creatinine, Ser: 0.81 mg/dL (ref 0.44–1.00)
GFR calc Af Amer: >60 mL/min (ref >60)
GFR calc non Af Amer: >60 mL/min (ref >60)
Glucose, Bld: 95 mg/dL (ref 70–99)
Potassium: 3.5 mmol/L (ref 3.5–5.1)
Sodium: 141 mmol/L (ref 135–145)
Total Bilirubin: 0.1 mg/dL — ABNORMAL LOW (ref 0.3–1.2)
Total Protein: 7.9 g/dL (ref 6.5–8.1)

## 2019-03-12 LAB — CBC
HCT: 35.1 % — ABNORMAL LOW (ref 36.0–46.0)
Hemoglobin: 10.4 g/dL — ABNORMAL LOW (ref 12.0–15.0)
MCH: 25.7 pg — ABNORMAL LOW (ref 26.0–34.0)
MCHC: 29.6 g/dL — ABNORMAL LOW (ref 30.0–36.0)
MCV: 86.9 fL (ref 80.0–100.0)
Platelets: 380 10*3/uL (ref 150–400)
RBC: 4.04 MIL/uL (ref 3.87–5.11)
RDW: 14.6 % (ref 11.5–15.5)
WBC: 6.9 10*3/uL (ref 4.0–10.5)
nRBC: 0 % (ref 0.0–0.2)

## 2019-03-12 LAB — URINALYSIS, ROUTINE W REFLEX MICROSCOPIC
Bilirubin Urine: NEGATIVE
Glucose, UA: NEGATIVE mg/dL
Hgb urine dipstick: NEGATIVE
Ketones, ur: NEGATIVE mg/dL
Leukocytes,Ua: NEGATIVE
Nitrite: NEGATIVE
Protein, ur: NEGATIVE mg/dL
Specific Gravity, Urine: 1.01 (ref 1.005–1.030)
pH: 7 (ref 5.0–8.0)

## 2019-03-12 LAB — SURGICAL PCR SCREEN
MRSA, PCR: NEGATIVE
Staphylococcus aureus: POSITIVE — AB

## 2019-03-12 LAB — PROTIME-INR
INR: 0.9 (ref 0.8–1.2)
Prothrombin Time: 12.1 seconds (ref 11.4–15.2)

## 2019-03-12 NOTE — Progress Notes (Signed)
[facility] [place] [address] Phone: [phone] Fax: [place], [place] [address] [place] [address] Phone: [phone] Fax: [phone]  [id number] [address], [place] [address] Phone: [phone] Fax: [phone]      Your procedure is scheduled on Tuesday, February 2nd, 2021.  Report to [facility] Main Entrance "A" at 5:30 A.M., and check in at the Admitting office.  Call this number if you have problems the morning of surgery:  [phone]  Call [phone] if you have any questions prior to your surgery date Monday-Friday 8am-4pm    Remember:  Do not eat or drink after midnight the night before your surgery    Take these medicines the morning of surgery with A SIP OF WATER :  Amlodipine (Norvasc) Buspirone (Buspar) Fluoxetine (Prozac) Gabapentin (Neurontin) Sertraline (Zoloft)  If needed, you may take: Albuterol (Ventolin) inhaler - bring with you the day of surgery Cyclobenzaprine (Flexeril) Diazepam (Valium) Hydrocodone-Acetaminophen (Norco) 7.5/325mg    Promethazine (Phenergan)  From this point forward, STOP taking any Aspirin (unless otherwise instructed by your surgeon), Aleve, Naproxen, Ibuprofen, Motrin, Advil, Goody's, BC's, all herbal medications, fish oil, and all vitamins.    The Morning of Surgery  Do not wear jewelry, make-up or nail polish.  Do not wear lotions, powders, or perfumes/colognes, or deodorant  Do not shave 48 hours prior to surgery.    Do not bring valuables to the hospital.  [facility] is not responsible for any belongings or valuables.  If you are a smoker, DO NOT Smoke 24 hours prior to surgery  If you wear a CPAP at night please bring your mask the morning of surgery   Remember that you must have someone to transport  you home after your surgery, and remain with you for 24 hours if you are discharged the same day.   Please bring cases for contacts, glasses, hearing aids, dentures or bridgework because it cannot be worn into surgery.    Leave your suitcase in the car.  After surgery it may be brought to your room.  For patients admitted to the hospital, discharge time will be determined by your treatment team.  Patients discharged the day of surgery will not be allowed to drive home.    Special instructions:   [place]- Preparing For Surgery  Before surgery, you can play an important role. Because skin is not sterile, your skin needs to be as free of germs as possible. You can reduce the number of germs on your skin by washing with CHG (chlorahexidine gluconate) Soap before surgery.  CHG is an antiseptic cleaner which kills germs and bonds with the skin to continue killing germs even after washing.    Oral Hygiene is also important to reduce your risk of infection.  Remember - BRUSH YOUR TEETH THE MORNING OF SURGERY WITH YOUR REGULAR TOOTHPASTE  Please do not use if you have an allergy to CHG or antibacterial soaps. If your skin becomes reddened/irritated stop using the CHG.  Do not shave (including legs and underarms) for at least 48 hours prior to first CHG shower. It is OK to shave your face.  Please follow these instructions carefully.   1. Shower the NIGHT BEFORE SURGERY and the MORNING OF SURGERY with CHG Soap.   2. If you chose to wash your hair, wash your hair first as  usual with your normal shampoo.  3. After you shampoo, rinse your hair and body thoroughly to remove the shampoo.  4. Use CHG as you would any other liquid soap. You can apply CHG directly to the skin and wash gently with a scrungie or a clean washcloth.   5. Apply the CHG Soap to your body ONLY FROM THE NECK DOWN.  Do not use on open wounds or open sores. Avoid contact with your eyes, ears, mouth and genitals (private  parts). Wash Face and genitals (private parts)  with your normal soap.   6. Wash thoroughly, paying special attention to the area where your surgery will be performed.  7. Thoroughly rinse your body with warm water from the neck down.  8. DO NOT shower/wash with your normal soap after using and rinsing off the CHG Soap.  9. Pat yourself dry with a CLEAN TOWEL.  10. Wear CLEAN PAJAMAS to bed the night before surgery, wear comfortable clothes the morning of surgery  11. Place CLEAN SHEETS on your bed the night of your first shower and DO NOT SLEEP WITH PETS.    Day of Surgery:  Please shower the morning of surgery with the CHG soap Do not apply any deodorants/lotions. Please wear clean clothes to the hospital/surgery center.   Remember to brush your teeth WITH YOUR REGULAR TOOTHPASTE.   Please read over the following fact sheets that you were given.

## 2019-03-12 NOTE — Progress Notes (Signed)
PCP:  Dr. Guadlupe Spanish Cardiologist:  Denies  EKG:  03/12/19 CXR: 03/12/19 ECHO:  denies Stress Test:  denies Cardiac Cath:  denies  Covid test 03/12/19  Patient denies shortness of breath, fever, cough, and chest pain at PAT appointment.  Patient verbalized understanding of instructions provided today at the PAT appointment.  Patient asked to review instructions at home and day of surgery.

## 2019-03-15 ENCOUNTER — Other Ambulatory Visit (HOSPITAL_COMMUNITY)
Admission: RE | Admit: 2019-03-15 | Discharge: 2019-03-15 | Disposition: A | Discharge status: Home / Self Care | Payer: Medicare HMO | Source: Ambulatory Visit | Attending: Specialist | Admitting: Specialist

## 2019-03-15 NOTE — Anesthesia Preprocedure Evaluation (Addendum)
Anesthesia Evaluation  Patient identified by MRN, date of birth, ID band Patient awake    Reviewed: Allergy & Precautions, NPO status , Patient's Chart, lab work & pertinent test results  Airway Mallampati: II  TM Distance: >3 FB     Dental   Pulmonary former smoker,    breath sounds clear to auscultation       Cardiovascular hypertension,  Rhythm:Regular Rate:Normal     Neuro/Psych    GI/Hepatic Neg liver ROS, GERD  ,  Endo/Other  negative endocrine ROS  Renal/GU negative Renal ROS     Musculoskeletal   Abdominal   Peds  Hematology   Anesthesia Other Findings   Reproductive/Obstetrics                            Anesthesia Physical Anesthesia Plan  ASA: III  Anesthesia Plan: General   Post-op Pain Management:    Induction: Intravenous  PONV Risk Score and Plan: 3 and Ondansetron, Dexamethasone and Midazolam  Airway Management Planned: Oral ETT  Additional Equipment:   Intra-op Plan:   Post-operative Plan: Possible Post-op intubation/ventilation  Informed Consent: I have reviewed the patients History and Physical, chart, labs and discussed the procedure including the risks, benefits and alternatives for the proposed anesthesia with the patient or authorized representative who has indicated his/her understanding and acceptance.     Dental advisory given  Plan Discussed with: CRNA and Anesthesiologist  Anesthesia Plan Comments:         Anesthesia Quick Evaluation

## 2019-03-16 ENCOUNTER — Inpatient Hospital Stay (HOSPITAL_COMMUNITY): Payer: Medicare HMO

## 2019-03-16 ENCOUNTER — Inpatient Hospital Stay (HOSPITAL_COMMUNITY)
Admission: RE | Admit: 2019-03-16 | Discharge: 2019-03-17 | DRG: 520 | Disposition: A | Discharge status: Home Health | Payer: Medicare HMO | Attending: Specialist | Admitting: Specialist

## 2019-03-16 ENCOUNTER — Encounter (HOSPITAL_COMMUNITY): Payer: Self-pay | Admitting: Specialist

## 2019-03-16 ENCOUNTER — Other Ambulatory Visit: Payer: Self-pay

## 2019-03-16 ENCOUNTER — Encounter (HOSPITAL_COMMUNITY)
Admission: RE | Disposition: A | Discharge status: Home / Self Care | Payer: Self-pay | Source: Home / Self Care | Attending: Specialist

## 2019-03-16 DIAGNOSIS — Z79899 Other long term (current) drug therapy: Secondary | ICD-10-CM | POA: Diagnosis not present

## 2019-03-16 DIAGNOSIS — Z87891 Personal history of nicotine dependence: Secondary | ICD-10-CM

## 2019-03-16 DIAGNOSIS — J302 Other seasonal allergic rhinitis: Secondary | ICD-10-CM | POA: Diagnosis present

## 2019-03-16 DIAGNOSIS — Z7982 Long term (current) use of aspirin: Secondary | ICD-10-CM

## 2019-03-16 DIAGNOSIS — K219 Gastro-esophageal reflux disease without esophagitis: Secondary | ICD-10-CM | POA: Diagnosis present

## 2019-03-16 DIAGNOSIS — M170 Bilateral primary osteoarthritis of knee: Secondary | ICD-10-CM | POA: Diagnosis present

## 2019-03-16 DIAGNOSIS — Z419 Encounter for procedure for purposes other than remedying health state, unspecified: Secondary | ICD-10-CM

## 2019-03-16 DIAGNOSIS — Z96641 Presence of right artificial hip joint: Secondary | ICD-10-CM | POA: Diagnosis present

## 2019-03-16 DIAGNOSIS — M5416 Radiculopathy, lumbar region: Secondary | ICD-10-CM

## 2019-03-16 DIAGNOSIS — Z20822 Contact with and (suspected) exposure to covid-19: Secondary | ICD-10-CM | POA: Diagnosis present

## 2019-03-16 DIAGNOSIS — Z9889 Other specified postprocedural states: Secondary | ICD-10-CM

## 2019-03-16 DIAGNOSIS — M48062 Spinal stenosis, lumbar region with neurogenic claudication: Secondary | ICD-10-CM | POA: Diagnosis present

## 2019-03-16 DIAGNOSIS — I10 Essential (primary) hypertension: Secondary | ICD-10-CM | POA: Diagnosis present

## 2019-03-16 DIAGNOSIS — M19041 Primary osteoarthritis, right hand: Secondary | ICD-10-CM | POA: Diagnosis present

## 2019-03-16 DIAGNOSIS — E785 Hyperlipidemia, unspecified: Secondary | ICD-10-CM | POA: Diagnosis present

## 2019-03-16 DIAGNOSIS — M5116 Intervertebral disc disorders with radiculopathy, lumbar region: Secondary | ICD-10-CM | POA: Diagnosis present

## 2019-03-16 DIAGNOSIS — F319 Bipolar disorder, unspecified: Secondary | ICD-10-CM | POA: Diagnosis present

## 2019-03-16 DIAGNOSIS — M19042 Primary osteoarthritis, left hand: Secondary | ICD-10-CM | POA: Diagnosis present

## 2019-03-16 DIAGNOSIS — Z8249 Family history of ischemic heart disease and other diseases of the circulatory system: Secondary | ICD-10-CM | POA: Diagnosis not present

## 2019-03-16 DIAGNOSIS — Z90710 Acquired absence of both cervix and uterus: Secondary | ICD-10-CM

## 2019-03-16 HISTORY — DX: Other specified postprocedural states: Z98.890

## 2019-03-16 HISTORY — PX: LUMBAR LAMINECTOMY: SHX95

## 2019-03-16 LAB — SARS CORONAVIRUS 2 (TAT 6-24 HRS): SARS Coronavirus 2: NEGATIVE

## 2019-03-16 SURGERY — MICRODISCECTOMY LUMBAR LAMINECTOMY
Anesthesia: General

## 2019-03-16 MED ORDER — SUCCINYLCHOLINE CHLORIDE 20 MG/ML IJ SOLN
INTRAMUSCULAR | Status: DC | PRN
  Administered 2019-03-16: 120 mg via INTRAVENOUS

## 2019-03-16 MED ORDER — SODIUM CHLORIDE 0.9% FLUSH: 3.0000 mL | Freq: Two times a day (BID) | INTRAVENOUS | Status: DC

## 2019-03-16 MED ORDER — FENTANYL CITRATE (PF) 100 MCG/2ML IJ SOLN
25.0000 ug | INTRAMUSCULAR | Status: DC | PRN
  Administered 2019-03-16: 25 ug via INTRAVENOUS
  Administered 2019-03-16: 50 ug via INTRAVENOUS

## 2019-03-16 MED ORDER — KETOROLAC TROMETHAMINE 15 MG/ML IJ SOLN
7.5000 mg | Freq: Four times a day (QID) | INTRAMUSCULAR | Status: AC
  Administered 2019-03-16 – 2019-03-17 (×4): 7.5 mg via INTRAVENOUS
  Filled 2019-03-16 (×3): qty 1

## 2019-03-16 MED ORDER — LISINOPRIL-HYDROCHLOROTHIAZIDE 20-12.5 MG PO TABS: 1.0000 | ORAL_TABLET | Freq: Every day | ORAL | Status: DC

## 2019-03-16 MED ORDER — CEFAZOLIN SODIUM-DEXTROSE 2-4 GM/100ML-% IV SOLN
2.0000 g | Freq: Three times a day (TID) | INTRAVENOUS | Status: AC
  Administered 2019-03-16 (×2): 2 g via INTRAVENOUS
  Filled 2019-03-16 (×2): qty 100

## 2019-03-16 MED ORDER — MIDAZOLAM HCL 2 MG/2ML IJ SOLN
INTRAMUSCULAR | Status: DC | PRN
  Administered 2019-03-16: 2 mg via INTRAVENOUS

## 2019-03-16 MED ORDER — SODIUM CHLORIDE 0.9% FLUSH: 3.0000 mL | INTRAVENOUS | Status: DC | PRN

## 2019-03-16 MED ORDER — CEFAZOLIN SODIUM-DEXTROSE 2-4 GM/100ML-% IV SOLN
2.0000 g | INTRAVENOUS | Status: AC
  Administered 2019-03-16: 08:00:00 2 g via INTRAVENOUS

## 2019-03-16 MED ORDER — 0.9 % SODIUM CHLORIDE (POUR BTL) OPTIME
TOPICAL | Status: DC | PRN
  Administered 2019-03-16: 08:00:00 1000 mL

## 2019-03-16 MED ORDER — CYCLOBENZAPRINE HCL 10 MG PO TABS
ORAL_TABLET | ORAL | Status: AC
  Filled 2019-03-16: qty 1

## 2019-03-16 MED ORDER — FENTANYL CITRATE (PF) 100 MCG/2ML IJ SOLN
INTRAMUSCULAR | Status: DC | PRN
  Administered 2019-03-16: 100 ug via INTRAVENOUS
  Administered 2019-03-16: 50 ug via INTRAVENOUS

## 2019-03-16 MED ORDER — ACETAMINOPHEN 650 MG RE SUPP: 650.0000 mg | RECTAL | Status: DC | PRN

## 2019-03-16 MED ORDER — KETOROLAC TROMETHAMINE 15 MG/ML IJ SOLN
INTRAMUSCULAR | Status: AC
  Filled 2019-03-16: qty 1

## 2019-03-16 MED ORDER — LACTATED RINGERS IV SOLN: INTRAVENOUS | Status: DC | PRN

## 2019-03-16 MED ORDER — TRANEXAMIC ACID-NACL 1000-0.7 MG/100ML-% IV SOLN
INTRAVENOUS | Status: DC | PRN
  Administered 2019-03-16: 1000 mg via INTRAVENOUS

## 2019-03-16 MED ORDER — MORPHINE SULFATE (PF) 2 MG/ML IV SOLN
1.0000 mg | INTRAVENOUS | Status: DC | PRN
  Administered 2019-03-16: 1 mg via INTRAVENOUS
  Filled 2019-03-16: qty 1

## 2019-03-16 MED ORDER — ROCURONIUM BROMIDE 10 MG/ML (PF) SYRINGE
PREFILLED_SYRINGE | INTRAVENOUS | Status: AC
  Filled 2019-03-16: qty 10

## 2019-03-16 MED ORDER — LIDOCAINE 2% (20 MG/ML) 5 ML SYRINGE
INTRAMUSCULAR | Status: AC
  Filled 2019-03-16: qty 5

## 2019-03-16 MED ORDER — BUPIVACAINE LIPOSOME 1.3 % IJ SUSP
20.0000 mL | Freq: Once | INTRAMUSCULAR | Status: AC
  Administered 2019-03-16: 11:00:00 20 mL
  Filled 2019-03-16: qty 20

## 2019-03-16 MED ORDER — ONDANSETRON HCL 4 MG/2ML IJ SOLN
INTRAMUSCULAR | Status: DC | PRN
  Administered 2019-03-16: 4 mg via INTRAVENOUS

## 2019-03-16 MED ORDER — FENTANYL CITRATE (PF) 100 MCG/2ML IJ SOLN
INTRAMUSCULAR | Status: AC
  Filled 2019-03-16: qty 2

## 2019-03-16 MED ORDER — FENTANYL CITRATE (PF) 250 MCG/5ML IJ SOLN
INTRAMUSCULAR | Status: AC
  Filled 2019-03-16: qty 5

## 2019-03-16 MED ORDER — THROMBIN 20000 UNITS EX SOLR: CUTANEOUS | Status: DC | PRN

## 2019-03-16 MED ORDER — ALBUTEROL SULFATE (2.5 MG/3ML) 0.083% IN NEBU: 3.0000 mL | INHALATION_SOLUTION | Freq: Four times a day (QID) | RESPIRATORY_TRACT | Status: DC | PRN

## 2019-03-16 MED ORDER — BUPIVACAINE HCL 0.5 % IJ SOLN
INTRAMUSCULAR | Status: DC | PRN
  Administered 2019-03-16: 20 mL

## 2019-03-16 MED ORDER — LACTATED RINGERS IV SOLN: INTRAVENOUS | Status: DC

## 2019-03-16 MED ORDER — PROPOFOL 10 MG/ML IV BOLUS
INTRAVENOUS | Status: AC
  Filled 2019-03-16: qty 20

## 2019-03-16 MED ORDER — LIDOCAINE HCL (CARDIAC) PF 100 MG/5ML IV SOSY
PREFILLED_SYRINGE | INTRAVENOUS | Status: DC | PRN
  Administered 2019-03-16: 60 mg via INTRAVENOUS

## 2019-03-16 MED ORDER — ONDANSETRON HCL 4 MG/2ML IJ SOLN
INTRAMUSCULAR | Status: AC
  Filled 2019-03-16: qty 2

## 2019-03-16 MED ORDER — BUPIVACAINE HCL (PF) 0.5 % IJ SOLN
INTRAMUSCULAR | Status: AC
  Filled 2019-03-16: qty 30

## 2019-03-16 MED ORDER — SODIUM CHLORIDE 0.9 % IV SOLN: 250.0000 mL | INTRAVENOUS | Status: DC

## 2019-03-16 MED ORDER — PHENAZOPYRIDINE HCL 200 MG PO TABS
200.0000 mg | ORAL_TABLET | Freq: Three times a day (TID) | ORAL | Status: DC
  Filled 2019-03-16 (×4): qty 1

## 2019-03-16 MED ORDER — MIDAZOLAM HCL 2 MG/2ML IJ SOLN
INTRAMUSCULAR | Status: AC
  Filled 2019-03-16: qty 2

## 2019-03-16 MED ORDER — OXYCODONE HCL 5 MG PO TABS
ORAL_TABLET | ORAL | Status: AC
  Filled 2019-03-16: qty 1

## 2019-03-16 MED ORDER — ALUM & MAG HYDROXIDE-SIMETH 200-200-20 MG/5ML PO SUSP: 30.0000 mL | Freq: Four times a day (QID) | ORAL | Status: DC | PRN

## 2019-03-16 MED ORDER — SODIUM CHLORIDE 0.9 % IV SOLN: INTRAVENOUS | Status: DC

## 2019-03-16 MED ORDER — POLYETHYLENE GLYCOL 3350 17 G PO PACK: 17.0000 g | PACK | Freq: Every day | ORAL | Status: DC | PRN

## 2019-03-16 MED ORDER — ADULT MULTIVITAMIN W/MINERALS CH: 1.0000 | ORAL_TABLET | Freq: Every day | ORAL | Status: DC

## 2019-03-16 MED ORDER — SERTRALINE HCL 50 MG PO TABS
50.0000 mg | ORAL_TABLET | Freq: Every day | ORAL | Status: DC
  Administered 2019-03-17: 50 mg via ORAL
  Filled 2019-03-16: qty 1

## 2019-03-16 MED ORDER — VITAMIN E 180 MG (400 UNIT) PO CAPS
400.0000 [IU] | ORAL_CAPSULE | Freq: Every day | ORAL | Status: DC
  Administered 2019-03-17: 400 [IU] via ORAL
  Filled 2019-03-16 (×2): qty 1

## 2019-03-16 MED ORDER — AMLODIPINE BESYLATE 5 MG PO TABS
5.0000 mg | ORAL_TABLET | Freq: Every day | ORAL | Status: DC
  Filled 2019-03-16: qty 1

## 2019-03-16 MED ORDER — ROCURONIUM BROMIDE 100 MG/10ML IV SOLN
INTRAVENOUS | Status: DC | PRN
  Administered 2019-03-16: 100 mg via INTRAVENOUS
  Administered 2019-03-16: 20 mg via INTRAVENOUS

## 2019-03-16 MED ORDER — THROMBIN 20000 UNITS EX KIT
PACK | CUTANEOUS | Status: DC | PRN
  Administered 2019-03-16: 20 mL via TOPICAL

## 2019-03-16 MED ORDER — CYCLOBENZAPRINE HCL 10 MG PO TABS
10.0000 mg | ORAL_TABLET | Freq: Three times a day (TID) | ORAL | Status: DC | PRN
  Administered 2019-03-16 – 2019-03-17 (×3): 10 mg via ORAL
  Filled 2019-03-16 (×2): qty 1

## 2019-03-16 MED ORDER — DEXAMETHASONE SODIUM PHOSPHATE 10 MG/ML IJ SOLN
INTRAMUSCULAR | Status: AC
  Filled 2019-03-16: qty 1

## 2019-03-16 MED ORDER — PHENOL 1.4 % MT LIQD: 1.0000 | OROMUCOSAL | Status: DC | PRN

## 2019-03-16 MED ORDER — PHENYLEPHRINE 40 MCG/ML (10ML) SYRINGE FOR IV PUSH (FOR BLOOD PRESSURE SUPPORT)
PREFILLED_SYRINGE | INTRAVENOUS | Status: AC
  Filled 2019-03-16: qty 10

## 2019-03-16 MED ORDER — LACTATED RINGERS IV BOLUS: 1000.0000 mL | Freq: Once | INTRAVENOUS | Status: DC

## 2019-03-16 MED ORDER — OXYCODONE HCL 5 MG PO TABS
10.0000 mg | ORAL_TABLET | ORAL | Status: DC | PRN
  Administered 2019-03-16 – 2019-03-17 (×5): 10 mg via ORAL
  Filled 2019-03-16 (×5): qty 2

## 2019-03-16 MED ORDER — MENTHOL 3 MG MT LOZG: 1.0000 | LOZENGE | OROMUCOSAL | Status: DC | PRN

## 2019-03-16 MED ORDER — THROMBIN 20000 UNITS EX SOLR
CUTANEOUS | Status: AC
  Filled 2019-03-16: qty 20000

## 2019-03-16 MED ORDER — DEXAMETHASONE SODIUM PHOSPHATE 10 MG/ML IJ SOLN
INTRAMUSCULAR | Status: DC | PRN
  Administered 2019-03-16: 10 mg via INTRAVENOUS

## 2019-03-16 MED ORDER — SODIUM CHLORIDE 0.9 % IV SOLN: INTRAVENOUS | Status: DC | PRN

## 2019-03-16 MED ORDER — LISINOPRIL 20 MG PO TABS
20.0000 mg | ORAL_TABLET | Freq: Every day | ORAL | Status: DC
  Administered 2019-03-17: 20 mg via ORAL
  Filled 2019-03-16: qty 1

## 2019-03-16 MED ORDER — PHENYLEPHRINE HCL-NACL 10-0.9 MG/250ML-% IV SOLN
INTRAVENOUS | Status: DC | PRN
  Administered 2019-03-16: 30 ug/min via INTRAVENOUS

## 2019-03-16 MED ORDER — CEFAZOLIN SODIUM-DEXTROSE 2-4 GM/100ML-% IV SOLN
INTRAVENOUS | Status: AC
  Filled 2019-03-16: qty 100

## 2019-03-16 MED ORDER — SENNOSIDES-DOCUSATE SODIUM 8.6-50 MG PO TABS: 1.0000 | ORAL_TABLET | Freq: Every evening | ORAL | Status: DC | PRN

## 2019-03-16 MED ORDER — ONDANSETRON HCL 4 MG PO TABS
4.0000 mg | ORAL_TABLET | Freq: Three times a day (TID) | ORAL | Status: DC | PRN
  Administered 2019-03-16 – 2019-03-17 (×2): 8 mg via ORAL
  Filled 2019-03-16 (×2): qty 2

## 2019-03-16 MED ORDER — BUSPIRONE HCL 10 MG PO TABS
10.0000 mg | ORAL_TABLET | Freq: Two times a day (BID) | ORAL | Status: DC
  Administered 2019-03-17: 10 mg via ORAL
  Filled 2019-03-16 (×3): qty 1

## 2019-03-16 MED ORDER — GABAPENTIN 100 MG PO CAPS: 200.0000 mg | ORAL_CAPSULE | Freq: Two times a day (BID) | ORAL | Status: DC

## 2019-03-16 MED ORDER — ALBUTEROL SULFATE HFA 108 (90 BASE) MCG/ACT IN AERS
INHALATION_SPRAY | RESPIRATORY_TRACT | Status: AC
  Filled 2019-03-16: qty 6.7

## 2019-03-16 MED ORDER — SUGAMMADEX SODIUM 200 MG/2ML IV SOLN
INTRAVENOUS | Status: DC | PRN
  Administered 2019-03-16: 200 mg via INTRAVENOUS

## 2019-03-16 MED ORDER — FLEET ENEMA 7-19 GM/118ML RE ENEM: 1.0000 | ENEMA | Freq: Once | RECTAL | Status: DC | PRN

## 2019-03-16 MED ORDER — ACETAMINOPHEN 325 MG PO TABS: 650.0000 mg | ORAL_TABLET | ORAL | Status: DC | PRN

## 2019-03-16 MED ORDER — CHLORHEXIDINE GLUCONATE 4 % EX LIQD: 60.0000 mL | Freq: Once | CUTANEOUS | Status: DC

## 2019-03-16 MED ORDER — OXYCODONE HCL 5 MG PO TABS
5.0000 mg | ORAL_TABLET | ORAL | Status: DC | PRN
  Administered 2019-03-16: 5 mg via ORAL

## 2019-03-16 MED ORDER — HYDROCHLOROTHIAZIDE 12.5 MG PO CAPS
12.5000 mg | ORAL_CAPSULE | Freq: Every day | ORAL | Status: DC
  Administered 2019-03-17: 12.5 mg via ORAL
  Filled 2019-03-16: qty 1

## 2019-03-16 MED ORDER — PROPOFOL 10 MG/ML IV BOLUS
INTRAVENOUS | Status: DC | PRN
  Administered 2019-03-16: 150 mg via INTRAVENOUS

## 2019-03-16 MED ORDER — QUETIAPINE FUMARATE 50 MG PO TABS
50.0000 mg | ORAL_TABLET | Freq: Every day | ORAL | Status: DC
  Administered 2019-03-16: 50 mg via ORAL
  Filled 2019-03-16 (×3): qty 1

## 2019-03-16 MED ORDER — PHENYLEPHRINE 40 MCG/ML (10ML) SYRINGE FOR IV PUSH (FOR BLOOD PRESSURE SUPPORT)
PREFILLED_SYRINGE | INTRAVENOUS | Status: DC | PRN
  Administered 2019-03-16 (×2): 40 ug via INTRAVENOUS
  Administered 2019-03-16 (×2): 80 ug via INTRAVENOUS
  Administered 2019-03-16: 120 ug via INTRAVENOUS
  Administered 2019-03-16: 40 ug via INTRAVENOUS

## 2019-03-16 MED ORDER — HYDROCODONE-ACETAMINOPHEN 7.5-325 MG PO TABS
1.0000 | ORAL_TABLET | Freq: Four times a day (QID) | ORAL | Status: DC
  Administered 2019-03-17: 1 via ORAL
  Filled 2019-03-16: qty 1

## 2019-03-16 MED ORDER — DOCUSATE SODIUM 100 MG PO CAPS
100.0000 mg | ORAL_CAPSULE | Freq: Two times a day (BID) | ORAL | Status: DC
  Administered 2019-03-16 – 2019-03-17 (×2): 100 mg via ORAL
  Filled 2019-03-16 (×2): qty 1

## 2019-03-16 MED ORDER — ALBUMIN HUMAN 5 % IV SOLN: INTRAVENOUS | Status: DC | PRN

## 2019-03-16 MED ORDER — FERROUS SULFATE 325 (65 FE) MG PO TABS
325.0000 mg | ORAL_TABLET | Freq: Every day | ORAL | Status: DC
  Administered 2019-03-17: 325 mg via ORAL
  Filled 2019-03-16: qty 1

## 2019-03-16 SURGICAL SUPPLY — 58 items
APL SKNCLS STERI-STRIP NONHPOA (GAUZE/BANDAGES/DRESSINGS) ×1
BENZOIN TINCTURE PRP APPL 2/3 (GAUZE/BANDAGES/DRESSINGS) ×3 IMPLANT
BUR SABER RD CUTTING 3.0 (BURR) ×2 IMPLANT
BUR SABER RD CUTTING 3.0MM (BURR) ×1
CANISTER SUCT 3000ML PPV (MISCELLANEOUS) ×3 IMPLANT
CLOSURE STERI-STRIP 1/2X4 (GAUZE/BANDAGES/DRESSINGS) ×1
CLOSURE WOUND 1/2 X4 (GAUZE/BANDAGES/DRESSINGS) ×1
CLSR STERI-STRIP ANTIMIC 1/2X4 (GAUZE/BANDAGES/DRESSINGS) ×1 IMPLANT
COVER MAYO STAND STRL (DRAPES) ×3 IMPLANT
COVER SURGICAL LIGHT HANDLE (MISCELLANEOUS) ×3 IMPLANT
COVER WAND RF STERILE (DRAPES) ×3 IMPLANT
DRAPE C-ARM 42X72 X-RAY (DRAPES) IMPLANT
DRAPE HALF SHEET 40X57 (DRAPES) IMPLANT
DRAPE MICROSCOPE LEICA (MISCELLANEOUS) ×3 IMPLANT
DRAPE SURG 17X23 STRL (DRAPES) ×12 IMPLANT
DRSG MEPILEX BORDER 4X4 (GAUZE/BANDAGES/DRESSINGS) IMPLANT
DRSG MEPILEX BORDER 4X8 (GAUZE/BANDAGES/DRESSINGS) ×2 IMPLANT
DURAPREP 26ML APPLICATOR (WOUND CARE) ×3 IMPLANT
ELECT BLADE 4.0 EZ CLEAN MEGAD (MISCELLANEOUS) ×3
ELECT CAUTERY BLADE 6.4 (BLADE) ×3 IMPLANT
ELECT REM PT RETURN 9FT ADLT (ELECTROSURGICAL) ×3
ELECTRODE BLDE 4.0 EZ CLN MEGD (MISCELLANEOUS) ×1 IMPLANT
ELECTRODE REM PT RTRN 9FT ADLT (ELECTROSURGICAL) ×1 IMPLANT
GLOVE BIOGEL PI IND STRL 8 (GLOVE) ×1 IMPLANT
GLOVE BIOGEL PI INDICATOR 8 (GLOVE) ×2
GLOVE ECLIPSE 8.5 STRL (GLOVE) ×3 IMPLANT
GLOVE ORTHO TXT STRL SZ7.5 (GLOVE) ×3 IMPLANT
GLOVE SURG 8.5 LATEX PF (GLOVE) ×3 IMPLANT
GOWN STRL REUS W/ TWL LRG LVL3 (GOWN DISPOSABLE) ×1 IMPLANT
GOWN STRL REUS W/TWL 2XL LVL3 (GOWN DISPOSABLE) ×6 IMPLANT
GOWN STRL REUS W/TWL LRG LVL3 (GOWN DISPOSABLE) ×3
KIT BASIN OR (CUSTOM PROCEDURE TRAY) ×3 IMPLANT
KIT TURNOVER KIT B (KITS) ×3 IMPLANT
NDL SPNL 18GX3.5 QUINCKE PK (NEEDLE) ×2 IMPLANT
NEEDLE SPNL 18GX3.5 QUINCKE PK (NEEDLE) ×6 IMPLANT
NS IRRIG 1000ML POUR BTL (IV SOLUTION) ×3 IMPLANT
PACK LAMINECTOMY ORTHO (CUSTOM PROCEDURE TRAY) ×3 IMPLANT
PAD ARMBOARD 7.5X6 YLW CONV (MISCELLANEOUS) ×6 IMPLANT
PATTIES SURGICAL .5 X.5 (GAUZE/BANDAGES/DRESSINGS) ×2 IMPLANT
PATTIES SURGICAL .75X.75 (GAUZE/BANDAGES/DRESSINGS) ×4 IMPLANT
SPONGE LAP 4X18 RFD (DISPOSABLE) IMPLANT
SPONGE SURGIFOAM ABS GEL 100 (HEMOSTASIS) ×3 IMPLANT
STAPLER VISISTAT 35W (STAPLE) ×2 IMPLANT
STRIP CLOSURE SKIN 1/2X4 (GAUZE/BANDAGES/DRESSINGS) ×2 IMPLANT
SUT VIC AB 0 CT1 27 (SUTURE) ×3
SUT VIC AB 0 CT1 27XBRD ANBCTR (SUTURE) IMPLANT
SUT VIC AB 1 CTX 36 (SUTURE) ×3
SUT VIC AB 1 CTX36XBRD ANBCTR (SUTURE) ×1 IMPLANT
SUT VIC AB 2-0 CT1 27 (SUTURE) ×3
SUT VIC AB 2-0 CT1 TAPERPNT 27 (SUTURE) ×1 IMPLANT
SUT VIC AB 3-0 X1 27 (SUTURE) ×3 IMPLANT
SUT VICRYL 0 UR6 27IN ABS (SUTURE) ×3 IMPLANT
SYR 20ML LL LF (SYRINGE) ×3 IMPLANT
SYR CONTROL 10ML LL (SYRINGE) ×3 IMPLANT
TOWEL GREEN STERILE (TOWEL DISPOSABLE) ×3 IMPLANT
TOWEL GREEN STERILE FF (TOWEL DISPOSABLE) ×3 IMPLANT
TRAY FOLEY W/BAG SLVR 14FR (SET/KITS/TRAYS/PACK) ×2 IMPLANT
WATER STERILE IRR 1000ML POUR (IV SOLUTION) ×3 IMPLANT

## 2019-03-16 NOTE — Transfer of Care (Signed)
Immediate Anesthesia Transfer of Care Note  Patient: Olivia Ewing  Procedure(s) Performed: CENTRAL LAMINECTOMIES L2-3, L3-4 AND L4-5 (N/A )  Patient Location: PACU  Anesthesia Type:General  Level of Consciousness: drowsy and patient cooperative  Airway & Oxygen Therapy: Patient Spontanous Breathing and Patient connected to face mask oxygen  Post-op Assessment: Report given to RN and Patient moving all extremities X 4  Post vital signs: Reviewed and stable  Last Vitals:  Vitals Value Taken Time  BP 117/74 03/16/19 1148  Temp    Pulse 91 03/16/19 1151  Resp 18 03/16/19 1151  SpO2 97 % 03/16/19 1151  Vitals shown include unvalidated device data.  Last Pain:  Vitals:   03/16/19 0609  PainSc: 9       Patients Stated Pain Goal: 5 ([id number])  Complications: No apparent anesthesia complications

## 2019-03-16 NOTE — Anesthesia Postprocedure Evaluation (Signed)
Anesthesia Post Note  Patient: Olivia Ewing  Procedure(s) Performed: CENTRAL LAMINECTOMIES L2-3, L3-4 AND L4-5 (N/A )     Patient location during evaluation: PACU Anesthesia Type: General Level of consciousness: awake Pain management: pain level controlled Vital Signs Assessment: post-procedure vital signs reviewed and stable Respiratory status: spontaneous breathing Cardiovascular status: stable Postop Assessment: no apparent nausea or vomiting Anesthetic complications: no    Last Vitals:  Vitals:   03/16/19 1305 03/16/19 1334  BP: 119/80 125/83  Pulse: 95 90  Resp: 19 20  Temp: 36.7 C 36.6 C  SpO2: 92% 100%    Last Pain:  Vitals:   03/16/19 1334  TempSrc: Oral  PainSc:                  Yasmin Dibello

## 2019-03-16 NOTE — Progress Notes (Addendum)
Blood pressure 87/66.  Pt. Is not symptomatic. Rechecked 94/60. Pt. Took amlodipine and lisinopril-htcz this am.Notified Dr. Conrad Forsyth. No new orders. Stated they will assess pt. when making rounds.  Notified Dr. Nyoka Cowden. New orders received for 1000 mg  LR bolus.Olivia Ewing

## 2019-03-16 NOTE — Anesthesia Procedure Notes (Signed)
Procedure Name: Intubation Date/Time: 03/16/2019 8:04 AM Performed by: Barrington Ellison, CRNA Pre-anesthesia Checklist: Patient identified, Emergency Drugs available, Suction available and Patient being monitored Patient Re-evaluated:Patient Re-evaluated prior to induction Oxygen Delivery Method: Circle System Utilized Preoxygenation: Pre-oxygenation with 100% oxygen Induction Type: IV induction and Rapid sequence Ventilation: Mask ventilation without difficulty and Oral airway inserted - appropriate to patient size Laryngoscope Size: Mac and 4 Grade View: Grade I Tube type: Oral Tube size: 7.0 mm Number of attempts: 1 Airway Equipment and Method: Stylet and Oral airway Placement Confirmation: ETT inserted through vocal cords under direct vision,  positive ETCO2 and breath sounds checked- equal and bilateral Secured at: 22 cm Tube secured with: Tape Dental Injury: Teeth and Oropharynx as per pre-operative assessment  Comments: Performed by Doretha Sou, SRNA

## 2019-03-16 NOTE — Evaluation (Signed)
Physical Therapy Evaluation Patient Details Name: Olivia Ewing MRN: [id number] DOB: 1950-07-06 Today's Date: 03/16/2019   History of Present Illness  69 yo female s/p L2-L5 central laminectomies 03/16/19. PMH includes R THA 2020, anemia, bipolar disorder, HTN, HLD, GERD, and spinal stenosis.  Clinical Impression   Pt presents with moderate to severe back pain, generalized weakness, decreased knowledge and application of back precautions, unsteadiness in standing, and decreased activity tolerance post-operatively. Pt to benefit from acute PT to address deficits. Pt ambulated hallway distance with no AD and occasional use of hallway railing to steady self. Pt lacking some safety awareness, but with PT cuing pt states "girl, I got this". Pt's aunt, grandchildren, and daughter to assist her with mobility at home as needed. PT to progress mobility as tolerated, and will continue to follow acutely.      Follow Up Recommendations Follow surgeon's recommendation for DC plan and follow-up therapies;Supervision for mobility/OOB    Equipment Recommendations  None recommended by PT    Recommendations for Other Services       Precautions / Restrictions Precautions Precautions: Fall;Back Precaution Booklet Issued: Yes (comment) Precaution Comments: handout administered, reviewed, and practiced with pt during mobility; pt able to state no bending, lifting, twisting with PT cuing for no arching Restrictions Weight Bearing Restrictions: No      Mobility  Bed Mobility Overal bed mobility: Needs Assistance Bed Mobility: Rolling;Sidelying to Sit;Sit to Sidelying Rolling: Supervision Sidelying to sit: Supervision     Sit to sidelying: Supervision General bed mobility comments: supervision for safety, verbal cuing for sequencing and log roll technique. No physical assist needed.  Transfers Overall transfer level: Needs assistance Equipment used: None Transfers: Sit to/from Stand Sit to  Stand: From elevated surface;Supervision         General transfer comment: supervision for safety, sit to stand x2 from EOB and from toilet with VC for bending from hips/knees.  Ambulation/Gait Ambulation/Gait assistance: Min guard;Supervision Gait Distance (Feet): 300 Feet Assistive device: None(hallway railing as needed) Gait Pattern/deviations: Step-through pattern;Decreased stride length Gait velocity: decr   General Gait Details: min guard to supervision for safety, verbal cuing for upright posture. Pt reaching for hallway equipment and railing to steady self as needed, pt did not bring cane with her to practice ambulation.  Stairs            Wheelchair Mobility    Modified Rankin (Stroke Patients Only)       Balance Overall balance assessment: Needs assistance Sitting-balance support: No upper extremity supported;Feet supported Sitting balance-Leahy Scale: Good     Standing balance support: No upper extremity supported Standing balance-Leahy Scale: Fair Standing balance comment: reaches for environment to self-steady                             Pertinent Vitals/Pain Pain Assessment: 0-10 Pain Score: 7  Pain Location: back, post-ambulation Pain Descriptors / Indicators: Sore;Aching Pain Intervention(s): Limited activity within patient's tolerance;Monitored during session;Repositioned;Patient requesting pain meds-RN notified    Home Living Family/patient expects to be discharged to:: Private residence Living Arrangements: Other relatives Available Help at Discharge: Family;Available 24 hours/day(auntie, 68 y.o. great auntie) Type of Home: House Home Access: Ramped entrance     Home Layout: One level Home Equipment: [place] - single point;Walker - 2 wheels;Bedside commode      Prior Function Level of Independence: Independent with assistive device(s)         Comments: pt reports using  cane as needed PTA     Hand Dominance   Dominant  Hand: Right    Extremity/Trunk Assessment   Upper Extremity Assessment Upper Extremity Assessment: Defer to OT evaluation    Lower Extremity Assessment Lower Extremity Assessment: Generalized weakness    Cervical / Trunk Assessment Cervical / Trunk Assessment: Normal  Communication   Communication: No difficulties  Cognition Arousal/Alertness: Awake/alert Behavior During Therapy: WFL for tasks assessed/performed Overall Cognitive Status: Within Functional Limits for tasks assessed Area of Impairment: Safety/judgement                         Safety/Judgement: Decreased awareness of safety;Decreased awareness of deficits     General Comments: Pt with unsafe practices with ambulation at times, with reaching for environment and hallway surfaces to steady self.      General Comments      Exercises     Assessment/Plan    PT Assessment Patient needs continued PT services  PT Problem List Decreased strength;Decreased mobility;Decreased safety awareness;Decreased activity tolerance;Decreased balance;Pain       PT Treatment Interventions DME instruction;Therapeutic activities;Gait training;Therapeutic exercise;Patient/family education;Balance training;Functional mobility training;Neuromuscular re-education    PT Goals (Current goals can be found in the Care Plan section)  Acute Rehab PT Goals Patient Stated Goal: go home tomorrow PT Goal Formulation: With patient Time For Goal Achievement: 03/23/19 Potential to Achieve Goals: Good    Frequency Min 5X/week   Barriers to discharge        Co-evaluation               AM-PAC PT "6 Clicks" Mobility  Outcome Measure Help needed turning from your back to your side while in a flat bed without using bedrails?: None Help needed moving from lying on your back to sitting on the side of a flat bed without using bedrails?: None Help needed moving to and from a bed to a chair (including a wheelchair)?: A  Little Help needed standing up from a chair using your arms (e.g., wheelchair or bedside chair)?: A Little Help needed to walk in hospital room?: A Little Help needed climbing 3-5 steps with a railing? : A Lot 6 Click Score: 19    End of Session Equipment Utilized During Treatment: Gait belt Activity Tolerance: Patient tolerated treatment well;Patient limited by fatigue;Patient limited by pain Patient left: in bed;with call bell/phone within reach;with bed alarm set;with SCD's reapplied Nurse Communication: Mobility status;Patient requests pain meds PT Visit Diagnosis: Other abnormalities of gait and mobility (R26.89);Muscle weakness (generalized) (M62.81)    Time: [id number] PT Time Calculation (min) (ACUTE ONLY): 19 min   Charges:   PT Evaluation $PT Eval Low Complexity: 1 Low         Shayan Bramhall E, PT Acute Rehabilitation Services Pager [phone]  Office [phone]  Esmond Hinch D Elonda Husky 03/16/2019, 5:52 PM

## 2019-03-16 NOTE — Discharge Instructions (Signed)
    No lifting greater than 10 lbs. Avoid bending, stooping and twisting. Walk in house for first week them may start to get out slowly increasing distance up to one mile by 3 weeks post op. Keep incision dry for 3 days, may use tegaderm or similar water impervious dressing.  

## 2019-03-16 NOTE — H&P (Signed)
Olivia Ewing is an 69 y.o. female.   Chief Complaint: back pain, LE radiculopathy HPI: 69 year old black female history of L2-L5 stenosis comes in for preop evaluation.  She continues to have ongoing symptoms that are unchanged from previous visit.  She is wanting to proceed with L2-L5 decompression as scheduled.    Past Medical History:  Diagnosis Date  . Anemia   . Arthritis    knees hands  . Bipolar disorder ([place])   . Cervicalgia   . Depression   . Environmental allergies    cause SOB, uses inhaler for  . Environmental and seasonal allergies    uses inhaler prn  . Full dentures   . GERD (gastroesophageal reflux disease)    diet controlled - no meds  . Hyperlipidemia    diet controlled, no meds  . Hypertension   . Lumbar radiculopathy, chronic   . Right knee pain    posterior horn medial meniscal tear MRI 2013  . Spinal stenosis    getting epidural injections -last one 06/12/2015  . SVD (spontaneous vaginal delivery)    x 4    Past Surgical History:  Procedure Laterality Date  . APPENDECTOMY    . Bladder tack  10/2006   cystocoele  . BREAST SURGERY Right    benign cyst  . COLONOSCOPY    . Hysterectomy other    . TOTAL HIP ARTHROPLASTY Right 04/27/2018   Procedure: RIGHT TOTAL HIP ARTHROPLASTY ANTERIOR APPROACH;  Surgeon: Leandrew Koyanagi, MD;  Location: [facility];  Service: Orthopedics;  Laterality: Right;  . TUBAL LIGATION      Family History  Problem Relation Age of Onset  . Hypertension Mother    Social History:  reports that she quit smoking about 35 years ago. She smoked 0.10 packs per day. She has never used smokeless tobacco. She reports current alcohol use. She reports previous drug use. Drug: Marijuana.  Allergies: No Known Allergies  Medications Prior to Admission  Medication Sig Dispense Refill  . albuterol (VENTOLIN HFA) 108 (90 Base) MCG/ACT inhaler Inhale 1-2 puffs into the lungs every 6 (six) hours as needed for wheezing or shortness of breath. 6.7 g 8   . amLODipine (NORVASC) 5 MG tablet TAKE 1 TABLET (5 MG TOTAL) BY MOUTH DAILY. 90 tablet 1  . aspirin EC 81 MG tablet Take 1 tablet (81 mg total) by mouth 2 (two) times daily. (Patient taking differently: Take 81 mg by mouth daily. ) 84 tablet 0  . busPIRone (BUSPAR) 10 MG tablet Take 1 tablet (10 mg total) by mouth 2 (two) times daily. 60 tablet 1  . cyclobenzaprine (FLEXERIL) 10 MG tablet Take 1 tablet (10 mg total) by mouth 3 (three) times daily as needed for muscle spasms. 60 tablet 1  . diclofenac (VOLTAREN) 75 MG EC tablet Take 1 tablet (75 mg total) by mouth 2 (two) times daily with a meal. 30 tablet 1  . ferrous sulfate 325 (65 FE) MG tablet Take 1 tablet (325 mg total) by mouth daily with breakfast. 90 tablet 0  . lisinopril-hydrochlorothiazide (ZESTORETIC) 20-12.5 MG tablet TAKE 1 TABLET BY MOUTH DAILY (Patient taking differently: Take 1 tablet by mouth daily. ) 90 tablet 2  . Multiple Vitamin (MULTIVITAMIN WITH MINERALS) TABS tablet Take 1 tablet by mouth daily.    . promethazine (PHENERGAN) 25 MG tablet Take 1 tablet (25 mg total) by mouth every 6 (six) hours as needed for nausea. 30 tablet 1  . QUEtiapine (SEROQUEL) 50 MG tablet Take  50 mg by mouth at bedtime.    . sertraline (ZOLOFT) 50 MG tablet Take 1 tablet (50 mg total) by mouth daily. 90 tablet 1  . vitamin E 400 UNIT capsule Take 400 Units by mouth daily.    . diazepam (VALIUM) 5 MG tablet Take one tab prn anxiety (Patient not taking: Reported on 03/12/2019) 5 tablet 0  . diclofenac (VOLTAREN) 75 MG EC tablet Take 1 tablet (75 mg total) by mouth 2 (two) times daily. (Patient not taking: Reported on 03/04/2019) 60 tablet 2  . gabapentin (NEURONTIN) 300 MG capsule Take 2 tabs three times a day (Patient not taking: Reported on 03/12/2019) 180 capsule 3  . HYDROcodone-acetaminophen (NORCO) 5-325 MG tablet Take 1-2 tablets by mouth daily as needed for moderate pain. (Patient not taking: Reported on 03/04/2019) 20 tablet 0  .  HYDROcodone-acetaminophen (NORCO) 7.5-325 MG tablet Take 1 tablet by mouth every 6 (six) hours as needed for moderate pain. (Patient not taking: Reported on 03/12/2019) 30 tablet 0  . methocarbamol (ROBAXIN) 500 MG tablet Take 1 tablet (500 mg total) by mouth 2 (two) times daily as needed for muscle spasms. (Patient not taking: Reported on 03/04/2019) 20 tablet 0  . mupirocin cream (BACTROBAN) 2 % Apply 1 application topically 2 (two) times daily. (Patient not taking: Reported on 03/04/2019) 15 g 0  . ondansetron (ZOFRAN) 4 MG tablet Take 1-2 tablets (4-8 mg total) by mouth every 8 (eight) hours as needed for nausea or vomiting. (Patient not taking: Reported on 03/04/2019) 40 tablet 0  . oxyCODONE-acetaminophen (PERCOCET) 7.5-325 MG tablet Take 1 tablet by mouth daily as needed for severe pain. (Patient not taking: Reported on 03/04/2019) 10 tablet 0  . phenazopyridine (PYRIDIUM) 200 MG tablet Take 1 tablet (200 mg total) by mouth 3 (three) times daily as needed for pain. (Patient not taking: Reported on 03/04/2019) 20 tablet 1  . predniSONE (STERAPRED UNI-PAK 21 TAB) 10 MG (21) TBPK tablet Take as directed (Patient not taking: Reported on 03/04/2019) 21 tablet 0  . senna-docusate (SENOKOT S) 8.6-50 MG tablet Take 1-2 tablets by mouth at bedtime as needed. (Patient not taking: Reported on 03/04/2019) 30 tablet 1    Results for orders placed or performed during the hospital encounter of 03/15/19 (from the past 48 hour(s))  SARS CORONAVIRUS 2 (TAT 6-24 HRS) Nasopharyngeal Nasopharyngeal Swab     Status: None   Collection Time: 03/15/19  2:07 PM   Specimen: Nasopharyngeal Swab  Result Value Ref Range   SARS Coronavirus 2 NEGATIVE NEGATIVE    Comment: (NOTE) SARS-CoV-2 target nucleic acids are NOT DETECTED. The SARS-CoV-2 RNA is generally detectable in upper and lower respiratory specimens during the acute phase of infection. Negative results do not preclude SARS-CoV-2 infection, do not rule  out co-infections with other pathogens, and should not be used as the sole basis for treatment or other patient management decisions. Negative results must be combined with clinical observations, patient history, and epidemiological information. The expected result is Negative. Fact Sheet for Patients: SugarRoll.be Fact Sheet for Healthcare Providers: https://www.woods-mathews.com/ This test is not yet approved or cleared by the [place] FDA and  has been authorized for detection and/or diagnosis of SARS-CoV-2 by FDA under an Emergency Use Authorization (EUA). This EUA will remain  in effect (meaning this test can be used) for the duration of the COVID-19 declaration under Section 56 4(b)(1) of the Act, 21 U.S.C. section 360bbb-3(b)(1), unless the authorization is terminated or revoked sooner. Performed at [facility]  Hospital Lab, [place] [address] [place], [place] [address]    No results found.  Review of Systems  Constitutional: Positive for activity change. Negative for fever.  HENT: Negative.   Respiratory: Negative.   Cardiovascular: Negative.   Gastrointestinal: Negative.   Genitourinary: Negative.   Musculoskeletal: Positive for back pain.  Psychiatric/Behavioral: Negative.     Blood pressure 94/60, pulse 67, temperature 97.8 F (36.6 C), resp. rate 18, height 5\' 5"  (1.651 m), SpO2 100 %. Physical Exam  Constitutional: She is oriented to person, place, and time. She appears well-developed.  HENT:  Head: Normocephalic and atraumatic.  Eyes: Pupils are equal, round, and reactive to light. EOM are normal.  Cardiovascular: Normal heart sounds.  Respiratory: Effort normal. No respiratory distress. She has no wheezes.  GI: Bowel sounds are normal. She exhibits no distension. There is no abdominal tenderness.  Musculoskeletal:        General: Tenderness present.     Cervical back: Normal range of motion.  Neurological: She is alert  and oriented to person, place, and time.  Skin: Skin is warm and dry.  Psychiatric: She has a normal mood and affect.     Assessment/Plan L2-L5 stenosis  We will proceed with L2-L5 decompression as scheduled  Surgical procedure discussed in detail.  All questions answered and she wishes to proceed    Benjiman Core, PA-C 03/16/2019, 7:15 AM

## 2019-03-16 NOTE — Interval H&P Note (Signed)
History and Physical Interval Note:  03/16/2019 7:42 AM  Olivia Ewing  has presented today for surgery, with the diagnosis of severe critical L2-3, L3-4 and L4-5 spinal stenosis with left L3-4 herniated disc.  The various methods of treatment have been discussed with the patient and family. After consideration of risks, benefits and other options for treatment, the patient has consented to  Procedure(s): CENTRAL LAMINECTOMIES L2-3, L3-4 AND L4-5 (N/A) as a surgical intervention.  The patient's history has been reviewed, patient examined, no change in status, stable for surgery.  I have reviewed the patient's chart and labs.  Questions were answered to the patient's satisfaction.     Basil Dess

## 2019-03-16 NOTE — Brief Op Note (Signed)
03/16/2019  11:14 AM  PATIENT:  [name]  69 y.o. female  PRE-OPERATIVE DIAGNOSIS:  severe critical L2-3, L3-4 and L4-5 spinal stenosis with left L3-4 herniated disc  POST-OPERATIVE DIAGNOSIS:  severe critical L2-3, L3-4 and L4-5 spinal stenosis with left L3-4   PROCEDURE:  Procedure(s): CENTRAL LAMINECTOMIES L2-3, L3-4 AND L4-5 (N/A)  SURGEON:  Surgeon(s) and Role:    * Jessy Oto, MD - Primary  PHYSICIAN ASSISTANT: Benjiman Core, PA-C  ANESTHESIA:   local and general, Dr. Nyoka Cowden.   EBL:  150 mL   DRAINS: Urinary Catheter (Foley)   LOCAL MEDICATIONS USED:  MARCAINE 0.5% 1:1 EXPAREL 1.3% and Amount: 30 ml  DISPOSITION OF SPECIMEN:  N/A  COUNTS:  YES  TOURNIQUET:  * No tourniquets in log *  DICTATION: .Dragon Dictation  PLAN OF CARE: Admit for overnight observation  PATIENT DISPOSITION:  PACU - hemodynamically stable.   Delay start of Pharmacological VTE agent (>24hrs) due to surgical blood loss or risk of bleeding: yes

## 2019-03-16 NOTE — Care Management (Signed)
Noted HH/DME orders; will hold on setting up [facility] and DME until after therapies have made their recommendations.  TOC will follow.   Reinaldo Raddle, RN, BSN  Trauma/Neuro ICU Case Manager [phone]

## 2019-03-16 NOTE — Op Note (Signed)
03/16/2019  11:32 AM  PATIENT:  Olivia Ewing  69 y.o. female  MRN: [id number]  OPERATIVE REPORT  PRE-OPERATIVE DIAGNOSIS:  severe critical L2-3, L3-4 and L4-5 spinal stenosis with left L3-4 herniated disc  POST-OPERATIVE DIAGNOSIS:  severe critical L2-3, L3-4 and L4-5 spinal stenosis with left L3-4   PROCEDURE:  Procedure(s): CENTRAL LAMINECTOMIES L2-3, L3-4 AND L4-5    SURGEON:  Jessy Oto, MD     ASSISTANT:  Benjiman Core, PA-C  (Present throughout the entire procedure and necessary for completion of procedure in a timely manner)     ANESTHESIA:  General, supplemented with local marcaine 0.5% 1:1 exparel 1.3% total 30 cc.  Dr. Belinda Block.  EBL: 150CC  DRAINS: Foley to SD.     COMPLICATIONS:  None.     PROCEDURE: The patient was met in the holding area, and the appropriate L2-3, L3-4 and  L4-5 levels identified and marked with an "X" and my initials. The patient was then transported to OR and was placed under general anesthesia without difficulty. Then placed on the operative table in a prone position. The [facility] spine OR table was used. The patient received appropriate preoperative antibiotic prophylaxis. Nursing staff inserted a Foley catheter under sterile conditions prior to turning. Standard prep with DuraPrep solution from the mid dorsal spine to the mid sacral level.Time-out procedure was called and correct .  Patient was draped in the usual manner. Iodine Vi-Drape was used.Initial incisions were made at the expected level of L4-5 approximately 2 1/2 - 3 inches in length. The incision were infiltrated with marcaine 0.5% 1:1 exparel 1.3%. Total of 20 cc were used. Skin subcutaneous layers divide down to the lumbodorsal fascia this was incised in both L2 spinous process and lumbosacral spinous process and clamps placed at the interspinous process at L3-4 and L4-5. A lateral radiograph demonstrated the upper clamp at the L3-4 interspinous space. Lower clamp was at the L4-5  interspinous space. The L2-3 to L4-5 levels were then exposed using Cobb elevators and electrocautery. These areas were then packed. Boss McCollough retractor was inserted at the incision site. Leksell rongeur used to remove a small portion of the inferior aspect is process of L2 and each of the spinous processes of L3 and L4. Leksell rongeur was then used to further remove bone down to the ligamentum flavum and the central portions of the lamina and base of the spinous process were thinned. This facets were then exposed out laterally and were hypertrophic.Osteotomes then used medial aspect of the L3-4 facet and L4-5 facet bilaterally resecting approximately 10-15% of the facet bilaterally. Kerrisons were then used to resect central portions of the lamina of L4 and L3. Ligamentum flavum at the L2-3 L3-4 and L4-5 levels were then carefully resected centrally preserving at least 7-8 mm with at the pars level L4 and L3. The central laminectomy was further widened performing more resection of the medial aspect of facet at both L4-5 and L3-4. Note that loupe magnification and headlight was used for this portion procedure.the central portions of the ligamentum flavum at the L2-3 level were resected and the medial aspect of the L2-3 facet resected over about 5-10%. Hypertrophic flava was found to be present. The operating room microscope sterilely draped brought into the field and then carefully the right side decompressed at each level beginning at the right L5 neuroforamen resect bone over the superior lamina of L5 and decompressing the right L5 foramen and reflected portion ligamentum flavum off the medial aspect  of the right L4-5 facet. Hockey-stick nerve probe could be passed out both the L4 and L5 neuroforamen. The medial aspect of facet at the L3-4 level was then carefully evaluated and hypertrophic ligamentum flavum resected using Kerrisons decompressing the lateral recess on this right side. This was done such  that hockey-stick nerve probe could be used to pass the outer recess demonstrating patency and decompression of the L4 nerve root. The left hisc at L3-4 was examined and did not show signs of extrusion or significant mass effect on the left L4 nerve root or L3 either. Attention then turned to the L2-3 were further debridement of the lateral recess and hypertrophic ligamentum flavum and reflected portions of ligamentum flavum were carried out. Changing sides of the OR table in decompression was carried out in similar fashion on the left side from the left L5 nerve root to the L2-3 level. At the left L4-5 level severe lateral recess stenosis was noted lateral recess was decompressed and the L5 nerve root freed up this protrusion was noted into the left lateral recess and posterior aspect disc on the left. Under the OR microscope Derricho retractor used to retract the thecal sac on the left side and the left L3-4 disc exposed over the posterior aspect of the L3-4 disc. Retracting carefully then the disc examined with nerve hook and found not to require excision. Further decompression was then carried out the left side of the spinal canal decompressing the L 4, the L3 and the L2 and L5 nerve roots. Following this then a hockey-stick nerve probe could be passed out easily bilateral L2-L3 and L4 and 5 neuroforamen. Following decompression bilaterally thrombin-soaked Gelfoam was applied to the laminotomy defects and these areas packed with small sponges. Adequate hemostasis was obtained at all levels. The thrombin-soaked Gelfoam was then removed and a medium Hemovac drain placed in the depths the incision the tip of the drain above the L2 lamina on the right side. Exiting on the right lower lumbar spine. Boss McCollough retractor was then removed. The end of the case a approximately 15 mm the central laminotomy was present with normal pulsation of the thecal sac present. Following additional irrigation and with no active  bleeding present at any level level, the incision was then closed by first approximating her lumbar muscles the midline with interrupted #1 Vicryl sutures loosely the lumbodorsal fascia then approximated with interrupted #1 Vicryl sutures. Deep subcutaneous layers approximated with interrupted #1-0 Vicryl suture more superficial layers with interrupted 2-0 Vicryl suture the skin closed with stainless steel staples levels. A Mepilex bandage was applied. All instrument and sponge counts were correct. Patient was then reactivated returned to supine position and extubated. He was then returned to recovery room in satisfactory condition.  Benjiman Core, PA-C for the duties of assistant surgeon throughout this case He assisted loupe magnification retracting delicate neural structures suctioning over the counter neural structures throughout the cane bilaterally at the lower bar segment and superiorly. Is present from the beginning of the case to the end of the case. Assisted in positioning the patient having in positioning the arm and legs. He also participated in removal the patient from the operating table.            Basil Dess  03/16/2019, 11:32 AM

## 2019-03-17 DIAGNOSIS — M48062 Spinal stenosis, lumbar region with neurogenic claudication: Principal | ICD-10-CM

## 2019-03-17 LAB — BASIC METABOLIC PANEL
Anion gap: 12 (ref 5–15)
BUN: 11 mg/dL (ref 8–23)
CO2: 23 mmol/L (ref 22–32)
Calcium: 8.4 mg/dL — ABNORMAL LOW (ref 8.9–10.3)
Chloride: 103 mmol/L (ref 98–111)
Creatinine, Ser: 0.92 mg/dL (ref 0.44–1.00)
GFR calc Af Amer: >60 mL/min (ref >60)
GFR calc non Af Amer: >60 mL/min (ref >60)
Glucose, Bld: 136 mg/dL — ABNORMAL HIGH (ref 70–99)
Potassium: 3.9 mmol/L (ref 3.5–5.1)
Sodium: 138 mmol/L (ref 135–145)

## 2019-03-17 LAB — CBC
HCT: 25.8 % — ABNORMAL LOW (ref 36.0–46.0)
Hemoglobin: 7.7 g/dL — ABNORMAL LOW (ref 12.0–15.0)
MCH: 25.8 pg — ABNORMAL LOW (ref 26.0–34.0)
MCHC: 29.8 g/dL — ABNORMAL LOW (ref 30.0–36.0)
MCV: 86.6 fL (ref 80.0–100.0)
Platelets: 287 10*3/uL (ref 150–400)
RBC: 2.98 MIL/uL — ABNORMAL LOW (ref 3.87–5.11)
RDW: 14.6 % (ref 11.5–15.5)
WBC: 12.1 10*3/uL — ABNORMAL HIGH (ref 4.0–10.5)
nRBC: 0 % (ref 0.0–0.2)

## 2019-03-17 MED ORDER — OXYCODONE-ACETAMINOPHEN 7.5-325 MG PO TABS: 2.0000 | ORAL_TABLET | Freq: Every day | ORAL | 0 refills | Status: DC | PRN

## 2019-03-17 NOTE — Plan of Care (Signed)
Patient alert and oriented, mae's well, voiding adequate amount of urine, swallowing without difficulty, no c/o pain at time of discharge. Patient discharged home with family. Script and discharged instructions given to patient. Patient and family stated understanding of instructions given. Patient has an appointment with Dr. Nitka in 2 weeks 

## 2019-03-17 NOTE — Progress Notes (Signed)
     Subjective: 1 Day Post-Op Procedure(s) (LRB): CENTRAL LAMINECTOMIES L2-3, L3-4 AND L4-5 (N/A)Awake, alert and oriented x 4. Walking in the hallway last evening 3 times and this AM. Demonstrates that she is able to get out of bed and walk independently. Voiding without difficulty  Patient reports pain as moderate.    Objective:   VITALS:  Temp:  [97.8 F (36.6 C)-98.7 F (37.1 C)] 98.3 F (36.8 C) (02/03 1142) Pulse Rate:  [81-109] 94 (02/03 1142) Resp:  [13-20] 20 (02/03 1142) BP: (102-161)/(58-89) 130/76 (02/03 1142) SpO2:  [92 %-100 %] 100 % (02/03 1142)  Neurologically intact ABD soft Neurovascular intact Sensation intact distally Intact pulses distally Dorsiflexion/Plantar flexion intact Incision: dressing C/D/I and no drainage No cellulitis present   LABS Recent Labs    03/17/19 0635  HGB 7.7*  WBC 12.1*  PLT 287   Recent Labs    03/17/19 0635  NA 138  K 3.9  CL 103  CO2 23  BUN 11  CREATININE 0.92  GLUCOSE 136*   No results for input(s): LABPT, INR in the last 72 hours.   Assessment/Plan: 1 Day Post-Op Procedure(s) (LRB): CENTRAL LAMINECTOMIES L2-3, L3-4 AND L4-5 (N/A)  Advance diet Up with therapy D/C IV fluids  Basil Dess 03/17/2019, 12:47 PMPatient ID: Olivia Ewing, female   DOB: 1950/11/23, 69 y.o.   MRN: [id number]

## 2019-03-17 NOTE — Progress Notes (Signed)
Physical Therapy Treatment and Discharge Patient Details Name: Olivia Ewing MRN: [id number] DOB: Oct 20, 1950 Today's Date: 03/17/2019    History of Present Illness 69 yo female s/p L2-L5 central laminectomies 03/16/19. PMH includes R THA 2020, anemia, bipolar disorder, HTN, HLD, GERD, and spinal stenosis.    PT Comments    Pt progressing well with post-op mobility. She was able to demonstrate transfers and ambulation with gross modified independence and SPC for support. Occasional cues for safety and awareness of maintaining back precautions. Overall pt appears to feel good and reports minimal pain. Pt was educated on precautions, appropriate activity progression, and car transfer. Pt has met all acute PT goals at this time and we will sign off. If needs change, please reconsult.    Follow Up Recommendations  No PT follow up;Supervision - Intermittent     Equipment Recommendations  Single point cane  Recommendations for Other Services       Precautions / Restrictions Precautions Precautions: Fall;Back Precaution Booklet Issued: Yes (comment)(OT provided) Precaution Comments: Pt was cued for precautions during functional mobility.  Restrictions Weight Bearing Restrictions: No    Mobility  Bed Mobility Overal bed mobility: Modified Independent Bed Mobility: Rolling;Sit to Sidelying         Sit to sidelying: Modified independent (Device/Increase time) General bed mobility comments: Good demonstration of log roll technique. Min cues to optimize maintenance of precautions.   Transfers Overall transfer level: Modified independent Equipment used: Straight cane Transfers: Sit to/from Stand Sit to Stand: Supervision         General transfer comment: No assist for controlled descent to EOB. Pt with proper hand placement on seated surface for safety.   Ambulation/Gait Ambulation/Gait assistance: Modified independent (Device/Increase time) Gait Distance (Feet): 350  Feet Assistive device: Straight cane Gait Pattern/deviations: Step-through pattern;Decreased stride length Gait velocity: Decreased Gait velocity interpretation: 1.31 - 2.62 ft/sec, indicative of limited community ambulator General Gait Details: Pt was able to negotiate hallway ambulation without difficulty. Noted pt with quick direction changes and even taking a few steps backwards at times without unsteadiness or LOB.    Stairs             Wheelchair Mobility    Modified Rankin (Stroke Patients Only)       Balance Overall balance assessment: Mild deficits observed, not formally tested Sitting-balance support: No upper extremity supported;Feet supported Sitting balance-Leahy Scale: Good     Standing balance support: No upper extremity supported Standing balance-Leahy Scale: Fair Standing balance comment: SPC for balance                            Cognition Arousal/Alertness: Awake/alert Behavior During Therapy: WFL for tasks assessed/performed Overall Cognitive Status: Within Functional Limits for tasks assessed Area of Impairment: Safety/judgement                         Safety/Judgement: Decreased awareness of safety;Decreased awareness of deficits     General Comments: Pt following commands and using SPC appropriately.      Exercises      General Comments        Pertinent Vitals/Pain Pain Assessment: Faces Pain Score: 3  Faces Pain Scale: Hurts a little bit Pain Location: Incision site/low back Pain Descriptors / Indicators: Sore;Operative site guarding Pain Intervention(s): Limited activity within patient's tolerance;Monitored during session;Repositioned    Home Living Family/patient expects to be discharged to:: Private residence Living Arrangements: Other  relatives(great auntie 30 yo, aunt) Available Help at Discharge: Family;Available 24 hours/day Type of Home: House Home Access: Ramped entrance   Home Layout: One  level Home Equipment: Cane - single point;Walker - 2 wheels;Bedside commode      Prior Function Level of Independence: Independent with assistive device(s)      Comments: pt reports using cane as needed PTA   PT Goals (current goals can now be found in the care plan section) Acute Rehab PT Goals Patient Stated Goal: go home today PT Goal Formulation: With patient Time For Goal Achievement: 03/23/19 Potential to Achieve Goals: Good Progress towards PT goals: Goals met/education completed, patient discharged from PT    Frequency    Min 5X/week      PT Plan Current plan remains appropriate    Co-evaluation              AM-PAC PT "6 Clicks" Mobility   Outcome Measure  Help needed turning from your back to your side while in a flat bed without using bedrails?: None Help needed moving from lying on your back to sitting on the side of a flat bed without using bedrails?: None Help needed moving to and from a bed to a chair (including a wheelchair)?: A Little Help needed standing up from a chair using your arms (e.g., wheelchair or bedside chair)?: A Little Help needed to walk in hospital room?: A Little Help needed climbing 3-5 steps with a railing? : A Lot 6 Click Score: 19    End of Session Equipment Utilized During Treatment: Gait belt Activity Tolerance: Patient tolerated treatment well;Patient limited by fatigue;Patient limited by pain Patient left: in bed;with call bell/phone within reach;with bed alarm set;with SCD's reapplied Nurse Communication: Mobility status PT Visit Diagnosis: Other abnormalities of gait and mobility (R26.89);Muscle weakness (generalized) (M62.81)     Time: 8333-8329 PT Time Calculation (min) (ACUTE ONLY): 18 min  Charges:  $Gait Training: 8-22 mins                     Rolinda Roan, PT, DPT Acute Rehabilitation Services Pager: [phone] Office: [phone]    Thelma Comp 03/17/2019, 11:51 AM

## 2019-03-17 NOTE — Evaluation (Signed)
Occupational Therapy Evaluation Patient Details Name: Olivia Ewing MRN: [id number] DOB: [birth year] Today's Date: 03/17/2019    History of Present Illness 69 yo female s/p L2-L5 central laminectomies 03/16/19. PMH includes R THA 2020, anemia, bipolar disorder, HTN, HLD, GERD, and spinal stenosis.   Clinical Impression   Pt PTA: Pt lindependent and living with family. Pt currently completing lower body dress with figure 4 technique and plans to perform cleansing LB this way in shower at home. Pt education on toileting to weight shift rather than bend, and for hygiene. Pt transferring in/out of walk in shower with supervisionA. Pt has [place] for task at home. Back handout provided and reviewed adls in detail. Pt educated on:  set an alarm at night for medication, avoid sitting for long periods of time, correct bed positioning for sleeping, correct sequence for bed mobility, avoiding lifting more than 5 pounds and never wash directly over incision. All education is complete and patient indicates understanding. Pt does not require continued OT skilled services as pt is nearly independent. OT signing off.  BSC for low commodes and to be used as proper shower chair.      Follow Up Recommendations  No OT follow up;Supervision - Intermittent    Equipment Recommendations  3 in 1 bedside commode    Recommendations for Other Services       Precautions / Restrictions Precautions Precautions: Fall;Back Precaution Booklet Issued: Yes (comment) Precaution Comments: handout verbally discussed. Pt able to state 3/3 precautions. Added arching with cues Restrictions Weight Bearing Restrictions: No      Mobility Bed Mobility Overal bed mobility: Modified Independent             General bed mobility comments: Pt sitting EOB; pt verbally able to recall log roll technique  Transfers Overall transfer level: Modified independent Equipment used: Straight cane Transfers: Sit to/from  Stand Sit to Stand: Supervision         General transfer comment: supervision for safety, sit to stand x2 from EOB and from toilet with VC for bending from hips/knees.    Balance Overall balance assessment: Mild deficits observed, not formally tested             Standing balance comment: SPC for balance                           ADL either performed or assessed with clinical judgement   ADL Overall ADL's : At baseline;Modified independent                                       General ADL Comments: Pt completing lower body dress with figure 4 technique and plans to perform cleansing LB this way in shower at home. Pt education on toileting to weight shift rather than bend, and for hygiene. Pt transferring in/out of walk in shower with supervisionA. Pt has [place] for task at home.     Vision Baseline Vision/History: No visual deficits Patient Visual Report: No change from baseline Vision Assessment?: No apparent visual deficits     Perception     Praxis      Pertinent Vitals/Pain Pain Assessment: 0-10 Pain Score: 3  Pain Location: back, post movement Pain Descriptors / Indicators: Sore;Aching Pain Intervention(s): Monitored during session;Premedicated before session     Hand Dominance Right   Extremity/Trunk Assessment Upper Extremity Assessment Upper Extremity Assessment:  Overall WFL for tasks assessed   Lower Extremity Assessment Lower Extremity Assessment: Defer to PT evaluation;Generalized weakness   Cervical / Trunk Assessment Cervical / Trunk Assessment: Other exceptions Cervical / Trunk Exceptions: s/p lumbar sx   Communication Communication Communication: No difficulties   Cognition Arousal/Alertness: Awake/alert Behavior During Therapy: WFL for tasks assessed/performed Overall Cognitive Status: Within Functional Limits for tasks assessed                                 General Comments: Pt following  commands and using SPC appropriately.   General Comments       Exercises     Shoulder Instructions      Home Living Family/patient expects to be discharged to:: Private residence Living Arrangements: Other relatives(great auntie 19 yo, aunt) Available Help at Discharge: Family;Available 24 hours/day Type of Home: House Home Access: Ramped entrance     Home Layout: One level     Bathroom Shower/Tub: Occupational psychologist: Standard     Home Equipment: Cane - single point;Walker - 2 wheels;Bedside commode          Prior Functioning/Environment Level of Independence: Independent with assistive device(s)        Comments: pt reports using cane as needed PTA        OT Problem List: Decreased activity tolerance      OT Treatment/Interventions:      OT Goals(Current goals can be found in the care plan section) Acute Rehab OT Goals Patient Stated Goal: go home today OT Goal Formulation: With patient  OT Frequency:     Barriers to D/C:            Co-evaluation              AM-PAC OT "6 Clicks" Daily Activity     Outcome Measure Help from another person eating meals?: None Help from another person taking care of personal grooming?: None Help from another person toileting, which includes using toliet, bedpan, or urinal?: None Help from another person bathing (including washing, rinsing, drying)?: A Little Help from another person to put on and taking off regular upper body clothing?: None Help from another person to put on and taking off regular lower body clothing?: None 6 Click Score: 23   End of Session Nurse Communication: Mobility status  Activity Tolerance: Patient tolerated treatment well Patient left: Other (comment)(In hallway with PT after shower transfer)  OT Visit Diagnosis: Pain;Unsteadiness on feet (R26.81) Pain - part of body: (back)                Time: [id number] OT Time Calculation (min): 25 min Charges:  OT General  Charges $OT Visit: 1 Visit OT Evaluation $OT Eval Moderate Complexity: 1 Mod OT Treatments $Self Care/Home Management : 8-22 mins  Ebony Hail Harold Hedge) Marsa Aris OTR/L Acute Rehabilitation Services Pager: [phone] Office: [place] 03/17/2019, 9:41 AM

## 2019-03-17 NOTE — TOC Transition Note (Signed)
Transition of Care [facility]) - CM/SW Discharge Note   Patient Details  Name: MUNIBA CISNEY MRN: [id number] Date of Birth: 30-Jun-1950  [facility] of [facility]) CM/SW Contact:  Ella Bodo, RN Phone Number: 03/17/2019, 11:53 AM   Clinical Narrative: 69 yo female s/p L2-L5 central laminectomies 03/16/19. PTA, pt independent, lives at home with family.  PT recommending [place] follow up, and pt agreeable to [facility] services; she prefers [facility] for [facility] needs.  Referral to [facility]; spoke with Joen Laura.  Pt has been given cane and 3 in 1 for home use.       Final next level of care: [place] Barriers to Discharge: Barriers Resolved   Patient Goals and CMS Choice Patient states their goals for this hospitalization and ongoing recovery are:: to feel better CMS Medicare.gov Compare Post Acute Care list provided to:: Patient                        Discharge Plan and Services   Discharge Planning Services: CM Consult Post Acute Care Choice: Home Health          DME Arranged: Kasandra Knudsen, 3-N-1 DME Agency: [employer] Date DME Agency Contacted: 03/17/19 Time DME Agency Contacted: 1000 Representative spoke with at DME Agency: Floor has equipment [place] Arranged: PT [place]: [facility] Date [place]: 03/17/19 Time [place]: 49 Representative spoke with at [place]: [place] ([place]) Interventions     Readmission Risk Interventions No flowsheet data found.  Reinaldo Raddle, RN, BSN  Trauma/Neuro ICU Case Manager [phone]

## 2019-03-18 ENCOUNTER — Other Ambulatory Visit: Payer: Self-pay

## 2019-03-18 ENCOUNTER — Ambulatory Visit: Payer: Medicare HMO | Admitting: Licensed Clinical Social Worker

## 2019-03-18 ENCOUNTER — Encounter (HOSPITAL_COMMUNITY): Payer: Self-pay | Admitting: *Deleted

## 2019-03-18 ENCOUNTER — Telehealth: Payer: Self-pay | Admitting: Specialist

## 2019-03-18 ENCOUNTER — Emergency Department (HOSPITAL_COMMUNITY)
Admission: EM | Admit: 2019-03-18 | Discharge: 2019-03-18 | Disposition: A | Discharge status: Home / Self Care | Payer: Medicare HMO | Attending: Emergency Medicine | Admitting: Emergency Medicine

## 2019-03-18 DIAGNOSIS — Z87891 Personal history of nicotine dependence: Secondary | ICD-10-CM | POA: Insufficient documentation

## 2019-03-18 DIAGNOSIS — M545 Low back pain, unspecified: Secondary | ICD-10-CM

## 2019-03-18 DIAGNOSIS — I10 Essential (primary) hypertension: Secondary | ICD-10-CM | POA: Diagnosis not present

## 2019-03-18 DIAGNOSIS — Z96641 Presence of right artificial hip joint: Secondary | ICD-10-CM | POA: Diagnosis not present

## 2019-03-18 DIAGNOSIS — Z79899 Other long term (current) drug therapy: Secondary | ICD-10-CM | POA: Insufficient documentation

## 2019-03-18 DIAGNOSIS — M4800 Spinal stenosis, site unspecified: Secondary | ICD-10-CM | POA: Diagnosis not present

## 2019-03-18 DIAGNOSIS — Z7982 Long term (current) use of aspirin: Secondary | ICD-10-CM | POA: Diagnosis not present

## 2019-03-18 MED ORDER — KETOROLAC TROMETHAMINE 60 MG/2ML IM SOLN
30.0000 mg | Freq: Once | INTRAMUSCULAR | Status: AC
  Administered 2019-03-18: 17:00:00 30 mg via INTRAMUSCULAR
  Filled 2019-03-18: qty 2

## 2019-03-18 MED ORDER — FENTANYL CITRATE (PF) 100 MCG/2ML IJ SOLN: 50.0000 ug | Freq: Once | INTRAMUSCULAR | Status: DC

## 2019-03-18 MED ORDER — ONDANSETRON HCL 4 MG/2ML IJ SOLN
4.0000 mg | Freq: Once | INTRAMUSCULAR | Status: AC
  Administered 2019-03-18: 16:00:00 4 mg via INTRAVENOUS
  Filled 2019-03-18: qty 2

## 2019-03-18 MED ORDER — MORPHINE SULFATE (PF) 4 MG/ML IV SOLN
4.0000 mg | Freq: Once | INTRAVENOUS | Status: AC
  Administered 2019-03-18: 16:00:00 4 mg via INTRAVENOUS
  Filled 2019-03-18: qty 1

## 2019-03-18 NOTE — ED Provider Notes (Signed)
Medical screening examination/treatment/procedure(s) were conducted as a shared visit with non-physician practitioner(s) and myself.  I personally evaluated the patient during the encounter.    Patient status post laminectomies from 2 days ago.  Patient for she has pain which is severe.  She denies any radiation of pain into her legs.  She denies weakness or numbness into the legs.  She has not any fevers or chills.  She did get behind on her oxycodone.  Patient is alert and appropriate.  No respiratory distress.  Mental status clear.  Movements coordinated and purposeful.  Agree with plan of management.   Charlesetta Shanks, MD 03/18/19 [phone]

## 2019-03-18 NOTE — Discharge Instructions (Addendum)
You have been seen today for back pain. Please read and follow all provided instructions. Return to the emergency room for worsening condition or new concerning symptoms.    1. Medications:  It is important you take your pain medication as prescribed for severe pain Continue usual home medications Take medications as prescribed. Please review all of the medicines and only take them if you do not have an allergy to them.   2. Treatment: rest, drink plenty of fluids  3. Follow Up:  Please follow up with your surgeon in 2 to 5 days for symptom recheck.   It is also a possibility that you have an allergic reaction to any of the medicines that you have been prescribed - Everybody reacts differently to medications and while MOST people have no trouble with most medicines, you may have a reaction such as nausea, vomiting, rash, swelling, shortness of breath. If this is the case, please stop taking the medicine immediately and contact your physician.  ?

## 2019-03-18 NOTE — Telephone Encounter (Signed)
Daughter Bebe Shaggy called stated she had missed a call from this [phone]. Advised per notes on chart Lis had called patient and did acknowledge patient was in a lot of pain and could hear beeping noise in background. Patient had went to ED. Advised Tamu of message below from Dr. Louanne Skye in regard to hospital bed and told [place] had spoken to patient. She stated "no way...she has gone to ER" I put her on hold to reach out to [facility] and she was aware that patient had gone to ED and she did in fact have a conversation with the patient. Tamu was in disbelief of Lis speaking with her mother while she was in ED but said ok and I read entire message from [place]. She understood and disconnected call.

## 2019-03-18 NOTE — Telephone Encounter (Signed)
I don't think hospital bed is necessary. She would be the first in 30 years for this surgery. Unfortunately expect some discomfort for up to 6 weeks as the muscles are healing. Should use ice locally and take meds for pain. Can take motrin or naproxyn for pain between narcotics.

## 2019-03-18 NOTE — ED Provider Notes (Signed)
[facility] EMERGENCY DEPARTMENT Provider Note   CSN: [id number] Arrival date & time: 03/18/19  1447     History Chief Complaint  Patient presents with  . Back Pain    Olivia Ewing is a 69 y.o. female past medical history significant for spinal stenosis presenting to emergency department today via EMS with chief complaint of constant back pain x 2 days. Patient had central laminectomies L2-3, L3-4 and L4-5 with Dr. Louanne Skye on 03/16/2019. She was discharged from the hospital yesterday. She states since being discharge home she has had progressively worsening back pain located on the sides of her incision that she describes as punching sensation, she rates pain 10/10. She is also endorsing intermittent spasms in her buttocks. She was prescribed oxycodone for pain and last dose was 2am this morning. She was unable to get to her pain medications throughout the day because her son had rearranged her bedroom and moved the pills.  Patient was given 100 mcg fentanyl for pain in route by EMS. Patient denies any fever, chills, chest pain, dyspnea, abdominal pain, saddle anesthesia, weakness or numbness in legs, bowel/bladder incontinence, urinary retention, saddle anesthesia, nausea, emesis. She denies any recent, fall, injury, or trauma since being discharged from the hospital.   Chart review shows patient's daughter called surgeon's office requesting a hospital bed be ordered due to patient's pain. Patient is unsure if daughter was able to speak with the surgeon or just left a message.   History provided by patient with additional history obtained from chart review.    Past Medical History:  Diagnosis Date  . Anemia   . Arthritis    knees hands  . Bipolar disorder ([place])   . Cervicalgia   . Depression   . Environmental allergies    cause SOB, uses inhaler for  . Environmental and seasonal allergies    uses inhaler prn  . Full dentures   . GERD (gastroesophageal reflux  disease)    diet controlled - no meds  . Hyperlipidemia    diet controlled, no meds  . Hypertension   . Lumbar radiculopathy, chronic   . Right knee pain    posterior horn medial meniscal tear MRI 2013  . Spinal stenosis    getting epidural injections -last one 06/12/2015  . SVD (spontaneous vaginal delivery)    x 4    Patient Active Problem List   Diagnosis Date Noted  . Spinal stenosis, lumbar region, with neurogenic claudication 03/16/2019    Class: Chronic  . Status post lumbar laminectomy 03/16/2019  . Acute stress reaction 09/25/2018  . Primary osteoarthritis of right hip 04/27/2018  . Status post total hip replacement, 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  . Shortness of breath 01/02/2018  . Chalazion left upper eyelid 02/20/2017  . Vitamin D insufficiency 11/25/2013  . Healthcare maintenance 11/25/2013  . Spinal stenosis of lumbar region 07/02/2013  . Arthritis, degenerative 11/17/2012  . Baker's cyst of knee 06/05/2012  . Rectocele 04/28/2012  . Left knee DJD, degenerative meniscus tear 04/06/2012  . Numbness and tingling in hands 03/17/2012  . Periodontitis 09/23/2011  . Depression 09/23/2011  . Right knee meniscal tear 03/14/2011  . Lumbar radiculopathy 03/14/2011  . Stress incontinence of urine 10/18/2010  . NECK PAIN, CHRONIC 08/19/2008  . Normocytic anemia 05/12/2008  . SHOULDER PAIN, RIGHT 04/28/2008  . Hyperlipemia 06/18/2007  . Essential hypertension 05/20/2007    Past Surgical History:  Procedure Laterality Date  . APPENDECTOMY    . Bladder tack  10/2006  cystocoele  . BREAST SURGERY Right    benign cyst  . COLONOSCOPY    . Hysterectomy other    . LUMBAR LAMINECTOMY N/A 03/16/2019   Procedure: CENTRAL LAMINECTOMIES L2-3, L3-4 AND L4-5;  Surgeon: Jessy Oto, MD;  Location: [facility];  Service: Orthopedics;  Laterality: N/A;  . TOTAL HIP ARTHROPLASTY Right 04/27/2018   Procedure: RIGHT TOTAL HIP ARTHROPLASTY ANTERIOR APPROACH;  Surgeon: Leandrew Koyanagi, MD;  Location: [facility];  Service: Orthopedics;  Laterality: Right;  . TUBAL LIGATION       OB History    Gravida  7   Para  4   Term  4   Preterm  0   AB  3   Living  4     SAB  3   TAB  0   Ectopic  0   Multiple  0   Live Births              Family History  Problem Relation Age of Onset  . Hypertension Mother     Social History   Tobacco Use  . Smoking status: Former Smoker    Packs/day: 0.10    Quit date: 11/26/1983    Years since quitting: 35.3  . Smokeless tobacco: Never Used  Substance Use Topics  . Alcohol use: Yes    Alcohol/week: 0.0 standard drinks    Comment: rarely  . Drug use: Not Currently    Types: Marijuana    Comment: last used 2019    Home Medications Prior to Admission medications   Medication Sig Start Date End Date Taking? Authorizing Provider  albuterol (VENTOLIN HFA) 108 (90 Base) MCG/ACT inhaler Inhale 1-2 puffs into the lungs every 6 (six) hours as needed for wheezing or shortness of breath. 01/29/19   Katherine Roan, MD  amLODipine (NORVASC) 5 MG tablet TAKE 1 TABLET (5 MG TOTAL) BY MOUTH DAILY. 07/29/18   Katherine Roan, MD  aspirin EC 81 MG tablet Take 1 tablet (81 mg total) by mouth 2 (two) times daily. Patient taking differently: Take 81 mg by mouth daily.  04/27/18   Leandrew Koyanagi, MD  busPIRone (BUSPAR) 10 MG tablet Take 1 tablet (10 mg total) by mouth 2 (two) times daily. 03/05/19   Ina Homes, MD  cyclobenzaprine (FLEXERIL) 10 MG tablet Take 1 tablet (10 mg total) by mouth 3 (three) times daily as needed for muscle spasms. 09/14/18   Dene Gentry, MD  diazepam (VALIUM) 5 MG tablet Take one tab prn anxiety Patient not taking: Reported on 03/12/2019 03/05/19   Ina Homes, MD  diclofenac (VOLTAREN) 75 MG EC tablet Take 1 tablet (75 mg total) by mouth 2 (two) times daily with a meal. 12/27/10 01/04/20  Sherren Kerns, MD  diclofenac (VOLTAREN) 75 MG EC tablet Take 1 tablet (75 mg total) by mouth 2 (two) times daily. Patient  not taking: Reported on 03/04/2019 01/04/19   Jessy Oto, MD  ferrous sulfate 325 (65 FE) MG tablet Take 1 tablet (325 mg total) by mouth daily with breakfast. 02/20/17   Lenore Cordia, MD  gabapentin (NEURONTIN) 300 MG capsule Take 2 tabs three times a day Patient not taking: Reported on 03/12/2019 05/12/15   Dickie La, MD  HYDROcodone-acetaminophen (NORCO) 5-325 MG tablet Take 1-2 tablets by mouth daily as needed for moderate pain. Patient not taking: Reported on 03/04/2019 07/28/18   Aundra Dubin, PA-C  lisinopril-hydrochlorothiazide (ZESTORETIC) 20-12.5 MG tablet TAKE 1 TABLET  BY MOUTH DAILY Patient taking differently: Take 1 tablet by mouth daily.  02/03/19   Katherine Roan, MD  Multiple Vitamin (MULTIVITAMIN WITH MINERALS) TABS tablet Take 1 tablet by mouth daily.    [provider]  ondansetron (ZOFRAN) 4 MG tablet Take 1-2 tablets (4-8 mg total) by mouth every 8 (eight) hours as needed for nausea or vomiting. Patient not taking: Reported on 03/04/2019 04/27/18   Leandrew Koyanagi, MD  oxyCODONE-acetaminophen (PERCOCET) 7.5-325 MG tablet Take 2 tablets by mouth daily as needed for severe pain. 03/17/19   Jessy Oto, MD  phenazopyridine (PYRIDIUM) 200 MG tablet Take 1 tablet (200 mg total) by mouth 3 (three) times daily as needed for pain. Patient not taking: Reported on 03/04/2019 09/21/17   Tamala Julian, [place], CNM  predniSONE (STERAPRED UNI-PAK 21 TAB) 10 MG (21) TBPK tablet Take as directed Patient not taking: Reported on 03/04/2019 10/28/18   Aundra Dubin, PA-C  promethazine (PHENERGAN) 25 MG tablet Take 1 tablet (25 mg total) by mouth every 6 (six) hours as needed for nausea. 04/27/18   Leandrew Koyanagi, MD  QUEtiapine (SEROQUEL) 50 MG tablet Take 50 mg by mouth at bedtime.    [provider]  senna-docusate (SENOKOT S) 8.6-50 MG tablet Take 1-2 tablets by mouth at bedtime as needed. Patient not taking: Reported on 03/04/2019 04/27/18   Leandrew Koyanagi, MD  sertraline  (ZOLOFT) 50 MG tablet Take 1 tablet (50 mg total) by mouth daily. 03/05/19   Ina Homes, MD  vitamin E 400 UNIT capsule Take 400 Units by mouth daily.    [provider]    Allergies    Patient has no known allergies.  Review of Systems   Review of Systems  All other systems are reviewed and are negative for acute change except as noted in the HPI.   Physical Exam Updated Vital Signs BP 129/75 (BP Location: Right Arm)   Pulse 87   Temp 98.7 F (37.1 C) (Oral)   Resp 20   SpO2 98%   Physical Exam Vitals and nursing note reviewed.  Constitutional:      General: She is in acute distress.     Appearance: She is not ill-appearing.     Comments: Patient is writhing around on the bed moaning in pain  HENT:     Head: Normocephalic and atraumatic.     Right Ear: Tympanic membrane and external ear normal.     Left Ear: Tympanic membrane and external ear normal.     Nose: Nose normal.     Mouth/Throat:     Mouth: Mucous membranes are moist.     Pharynx: Oropharynx is clear.  Eyes:     General: No scleral icterus.       Right eye: No discharge.        Left eye: No discharge.     Extraocular Movements: Extraocular movements intact.     Conjunctiva/sclera: Conjunctivae normal.     Pupils: Pupils are equal, round, and reactive to light.  Neck:     Vascular: No JVD.  Cardiovascular:     Rate and Rhythm: Normal rate and regular rhythm.     Pulses: Normal pulses.          Radial pulses are 2+ on the right side and 2+ on the left side.       Dorsalis pedis pulses are 2+ on the right side and 2+ on the left side.     Heart sounds:  Normal heart sounds.  Pulmonary:     Comments: Lungs clear to auscultation in all fields. Symmetric chest rise. No wheezing, rales, or rhonchi. Abdominal:     Comments: Abdomen is soft, non-distended, and non-tender in all quadrants. No rigidity, no guarding. No peritoneal signs.  Musculoskeletal:        General: Normal range of motion.      Cervical back: Normal range of motion.       Back:     Comments: Moving all extremities without signs of injury  Skin:    General: Skin is warm and dry.     Capillary Refill: Capillary refill takes less than 2 seconds.  Neurological:     Mental Status: She is oriented to person, place, and time.     GCS: GCS eye subscore is 4. GCS verbal subscore is 5. GCS motor subscore is 6.     Comments: Fluent speech, no facial droop.  Psychiatric:        Behavior: Behavior normal.       ED Results / Procedures / Treatments   Labs (all labs ordered are listed, but only abnormal results are displayed) Labs Reviewed - No data to display  EKG None  Radiology No results found.  Procedures Procedures (including critical care time)  Medications Ordered in ED Medications - No data to display  ED Course  I have reviewed the triage vital signs and the nursing notes.  Pertinent labs & imaging results that were available during my care of the patient were reviewed by me and considered in my medical decision making (see chart for details).    MDM Rules/Calculators/A&P                      Patient seen and examined. Patient presents awake, alert, hemodynamically stable, afebrile, non toxic.  Patient is willing to bed writhing in pain.  During exam I am slightly able to distract her and she is able to engage in normal conversation without crying out.  Dressing over lumbar incision is clean dry and intact, no signs of infection.  She is also reporting intermittent muscle spasms in her buttock, I do not appreciate any spasms on exam.  Given 100 mcg of fentanyl by EMS, also given 4 mg of morphine here in the ED.  On reassessment she reports pain has significantly improved.  She now rates the pain 2 /10 in severity.  She is resting comfortably.   Given her reassuring vitals and exams, ability to control pain here in the ED feel that patient can be discharged home with symptomatic care. No infectious  symptoms, incision is well appearing and no numbness, weakness in legs.  Patient's severe pain on arrival was likely from getting behind on her pain medication.   The patient appears reasonably screened and/or stabilized for discharge and I doubt any other medical condition or other [facility] requiring further screening, evaluation, or treatment in the ED at this time prior to discharge. The patient is safe for discharge with strict return precautions discussed. Recommend neurosurgery follow up if symptoms persist. Patient has prescription for oxycodone at home and does not need further pain medication at discharge. Findings and plan of care discussed with supervising physician Dr. Johnney Killian.    Portions of this note were generated with [employer]. Dictation errors may occur despite best attempts at proofreading.   Final Clinical Impression(s) / ED Diagnoses Final diagnoses:  None    Rx / DC Orders ED  Discharge Orders    None       Flint Melter 03/18/19 1638    Charlesetta Shanks, MD 03/18/19 [phone]

## 2019-03-18 NOTE — ED Notes (Signed)
[facility]AR Called/Canceled @ 1749-per [place], RN called by Levada Dy

## 2019-03-18 NOTE — Telephone Encounter (Signed)
Patient's daughter Bebe Shaggy called requesting a hospital bed be order from Dr. Otho Ket office for mother due to pain. Please give daughter a call about this script. Tamu will speak to Dr. Otho Ket nurse if not Dr. Louanne Skye himself. Tamu phone number is [phone].

## 2019-03-18 NOTE — ED Notes (Signed)
Patient verbalized understanding of DC instructions. Patient awaiting transport home with [facility]

## 2019-03-18 NOTE — Telephone Encounter (Signed)
Called Patient to advise on message below. She is aware. Also advised her I would call her Daughter to advise on message below. I called daughter no answer & could not leave VM since mailbox was full.

## 2019-03-18 NOTE — ED Triage Notes (Signed)
Patient presents to ed via [facility] states she had back surgery 2/2 and was sent home, states she has been hurting every since however hasn't called her doctor, states she is taking pain medication without relief.. denies numbness or tingling in legs. Patient was given Fentanyl 100 mcg per ems.

## 2019-03-19 ENCOUNTER — Telehealth: Payer: Self-pay | Admitting: Specialist

## 2019-03-19 NOTE — Telephone Encounter (Signed)
FYI

## 2019-03-19 NOTE — Telephone Encounter (Signed)
Darlene from [facility] called.   She wanted to make [name] aware that they have been calling the patient and her emergency contacts several time with no response. They have not been able to set her PT up.  Call back number: [phone]

## 2019-03-24 MED FILL — Sodium Chloride IV Soln 0.9%: INTRAVENOUS | Qty: 1000 | Status: AC

## 2019-03-24 MED FILL — Heparin Sodium (Porcine) Inj 1000 Unit/ML: INTRAMUSCULAR | Qty: 30 | Status: AC

## 2019-03-30 ENCOUNTER — Other Ambulatory Visit: Payer: Self-pay | Admitting: Specialist

## 2019-03-30 NOTE — Telephone Encounter (Signed)
Pt called in stating her oxycodone was knocked over and spilt and she was wondering if she could get a refill sent to her [facility] pharmacy on [address].   [phone]

## 2019-03-30 NOTE — Discharge Summary (Signed)
[name] MRN: [id number] DOB/AGE: 68/24/1952 68 y.o.  Admit date: 03/16/2019 Discharge date:03/17/2019 Admission Diagnoses:  Principal Problem:   Spinal stenosis, lumbar region, with neurogenic claudication Active Problems:   Status post lumbar laminectomy   Discharge Diagnoses:  Principal Problem:   Spinal stenosis, lumbar region, with neurogenic claudication Active Problems:   Status post lumbar laminectomy  status post Procedure(s): CENTRAL LAMINECTOMIES L2-3, L3-4 AND L4-5  Past Medical History:  Diagnosis Date  . Anemia   . Arthritis    knees hands  . Bipolar disorder ([place])   . Cervicalgia   . Depression   . Environmental allergies    cause SOB, uses inhaler for  . Environmental and seasonal allergies    uses inhaler prn  . Full dentures   . GERD (gastroesophageal reflux disease)    diet controlled - no meds  . Hyperlipidemia    diet controlled, no meds  . Hypertension   . Lumbar radiculopathy, chronic   . Right knee pain    posterior horn medial meniscal tear MRI 2013  . Spinal stenosis    getting epidural injections -last one 06/12/2015  . SVD (spontaneous vaginal delivery)    x 4    Surgeries: Procedure(s): CENTRAL LAMINECTOMIES L2-3, L3-4 AND L4-5 on 03/16/2019   Consultants:   Discharged Condition: Improved  Hospital Course: Olivia Ewing is an 69 y.o. female who was admitted 03/16/2019 for operative treatment of Spinal stenosis, lumbar region, with neurogenic claudication. Patient failed conservative treatments (please see the history and physical for the specifics) and had severe unremitting pain that affects sleep, daily activities and work/hobbies. After pre-op clearance, the patient was taken to the operating room on 03/16/2019 and underwent  Procedure(s): CENTRAL LAMINECTOMIES L2-3, L3-4 AND L4-5.    Patient was given perioperative antibiotics:  Anti-infectives (From admission, onward)   Start     Dose/Rate Route Frequency  Ordered Stop   03/16/19 1345  ceFAZolin (ANCEF) IVPB 2g/100 mL premix     2 g 200 mL/hr over 30 Minutes Intravenous Every 8 hours 03/16/19 1338 03/16/19 2032   03/16/19 0600  ceFAZolin (ANCEF) IVPB 2g/100 mL premix     2 g 200 mL/hr over 30 Minutes Intravenous On call to O.R. 03/16/19 0550 03/16/19 0851   03/16/19 0558  ceFAZolin (ANCEF) 2-4 GM/100ML-% IVPB    Note to Pharmacy: Nyoka Cowden   : cabinet override      03/16/19 0558 03/16/19 [id number]       Patient was given sequential compression devices and early ambulation to prevent DVT.   Patient benefited maximally from hospital stay and there were no complications. At the time of discharge, the patient was urinating/moving their bowels without difficulty, tolerating a regular diet, pain is controlled with oral pain medications and they have been cleared by PT/OT.   Recent vital signs: No data found.   Recent laboratory studies: No results for input(s): WBC, HGB, HCT, PLT, NA, K, CL, CO2, BUN, CREATININE, GLUCOSE, INR, CALCIUM in the last 72 hours.  Invalid input(s): PT, 2   Discharge Medications:   Allergies as of 03/17/2019   No Known Allergies     Medication List    STOP taking these medications   methocarbamol 500 MG tablet Commonly known as: Robaxin   mupirocin cream 2 % Commonly known as: BACTROBAN     TAKE these medications   albuterol 108 (90 Base) MCG/ACT inhaler Commonly known as: VENTOLIN HFA Inhale 1-2 puffs into the lungs every  6 (six) hours as needed for wheezing or shortness of breath.   amLODipine 5 MG tablet Commonly known as: NORVASC TAKE 1 TABLET (5 MG TOTAL) BY MOUTH DAILY.   aspirin EC 81 MG tablet Take 1 tablet (81 mg total) by mouth 2 (two) times daily. What changed: when to take this   busPIRone 10 MG tablet Commonly known as: BUSPAR Take 1 tablet (10 mg total) by mouth 2 (two) times daily.   cyclobenzaprine 10 MG tablet Commonly known as: FLEXERIL Take 1 tablet (10 mg total) by mouth  3 (three) times daily as needed for muscle spasms.   diazepam 5 MG tablet Commonly known as: VALIUM Take one tab prn anxiety   diclofenac 75 MG EC tablet Commonly known as: Voltaren Take 1 tablet (75 mg total) by mouth 2 (two) times daily with a meal.   diclofenac 75 MG EC tablet Commonly known as: VOLTAREN Take 1 tablet (75 mg total) by mouth 2 (two) times daily.   ferrous sulfate 325 (65 FE) MG tablet Take 1 tablet (325 mg total) by mouth daily with breakfast.   gabapentin 300 MG capsule Commonly known as: NEURONTIN Take 2 tabs three times a day   HYDROcodone-acetaminophen 5-325 MG tablet Commonly known as: Norco Take 1-2 tablets by mouth daily as needed for moderate pain. What changed: Another medication with the same name was removed. Continue taking this medication, and follow the directions you see here.   lisinopril-hydrochlorothiazide 20-12.5 MG tablet Commonly known as: ZESTORETIC TAKE 1 TABLET BY MOUTH DAILY   multivitamin with minerals Tabs tablet Take 1 tablet by mouth daily.   ondansetron 4 MG tablet Commonly known as: ZOFRAN Take 1-2 tablets (4-8 mg total) by mouth every 8 (eight) hours as needed for nausea or vomiting.   oxyCODONE-acetaminophen 7.5-325 MG tablet Commonly known as: Percocet Take 2 tablets by mouth daily as needed for severe pain. What changed: how much to take   phenazopyridine 200 MG tablet Commonly known as: Pyridium Take 1 tablet (200 mg total) by mouth 3 (three) times daily as needed for pain.   predniSONE 10 MG (21) Tbpk tablet Commonly known as: STERAPRED UNI-PAK 21 TAB Take as directed   promethazine 25 MG tablet Commonly known as: PHENERGAN Take 1 tablet (25 mg total) by mouth every 6 (six) hours as needed for nausea.   QUEtiapine 50 MG tablet Commonly known as: SEROQUEL Take 50 mg by mouth at bedtime.   senna-docusate 8.6-50 MG tablet Commonly known as: Senokot S Take 1-2 tablets by mouth at bedtime as needed.    sertraline 50 MG tablet Commonly known as: ZOLOFT Take 1 tablet (50 mg total) by mouth daily.   vitamin E 180 MG (400 UNITS) capsule Take 400 Units by mouth daily.       Diagnostic Studies: DG Chest 2 View  Result Date: 03/12/2019 CLINICAL DATA:  69 year old female undergoing preoperative evaluation prior to lumbar spine surgery EXAM: CHEST - 2 VIEW COMPARISON:  Prior chest x-ray 04/20/2018 FINDINGS: The lungs are clear and negative for focal airspace consolidation, pulmonary edema or suspicious pulmonary nodule. No pleural effusion or pneumothorax. Cardiac and mediastinal contours are within normal limits. No acute fracture or lytic or blastic osseous lesions. The visualized upper abdominal bowel gas pattern is unremarkable. Degenerative osteoarthritis of both acromioclavicular joints. IMPRESSION: No active cardiopulmonary disease. Electronically Signed   By: Jacqulynn Cadet M.D.   On: 03/12/2019 12:05   MR CERVICAL SPINE WO CONTRAST  Result Date: 03/04/2019 CLINICAL DATA:  Upper arm pain, stress fracture suspected EXAM: MRI CERVICAL SPINE WITHOUT CONTRAST TECHNIQUE: Multiplanar, multisequence MR imaging of the cervical spine was performed. No intravenous contrast was administered. COMPARISON:  04/09/2007 FINDINGS: Alignment: 1-2 mm anterolisthesis of C7 on T1. Vertebrae: No fracture, evidence of discitis, or bone lesion. Cord: Normal signal and morphology. Posterior Fossa, vertebral arteries, paraspinal tissues: Posterior fossa demonstrates no focal abnormality. Vertebral artery flow voids are maintained. Paraspinal soft tissues are unremarkable. Disc levels: Disc spaces: Degenerative disc disease with disc height loss at C5-6 C2-3: No significant disc bulge. No neural foraminal stenosis. No central canal stenosis. C3-4: Small central disc protrusion. Mild bilateral facet arthropathy. Mild bilateral foraminal stenosis. No central canal stenosis. C4-5: Broad-based disc bulge. Mild bilateral  foraminal stenosis. Mild bilateral facet arthropathy. No central canal stenosis. C5-6: Broad-based disc osteophyte complex. Bilateral uncovertebral degenerative changes. Severe bilateral foraminal stenosis. Mild spinal stenosis. C6-7: No significant disc bulge. No neural foraminal stenosis. No central canal stenosis. C7-T1: Broad-based disc bulge. Severe right and moderate left facet arthropathy. Severe right foraminal stenosis. Moderate-severe left foraminal stenosis. Mild spinal stenosis. IMPRESSION: 1. Diffuse cervical spine spondylosis as described above. 2.  No acute osseous injury of the cervical spine. Electronically Signed   By: Kathreen Devoid   On: 03/04/2019 15:58   DG Lumbar Spine 1 View  Result Date: 03/16/2019 CLINICAL DATA:  Localization image in operating room EXAM: LUMBAR SPINE - 1 VIEW COMPARISON:  01/04/2019 FINDINGS: Lateral view of the lumbar spine demonstrates surgical instrumentation posterior to the L4/L5 disc space. There is diffuse lumbar spondylosis from L2 through L5. IMPRESSION: 1. Surgical instrumentation posterior to the L4/L5 level as above. Electronically Signed   By: Randa Ngo M.D.   On: 03/16/2019 11:27   MR SHOULDER RIGHT WO CONTRAST  Result Date: 03/04/2019 CLINICAL DATA:  Right shoulder pain. Ceiling tile fell on shoulder 5 months ago. EXAM: MRI OF THE RIGHT SHOULDER WITHOUT CONTRAST TECHNIQUE: Multiplanar, multisequence MR imaging of the shoulder was performed. No intravenous contrast was administered. COMPARISON:  None. FINDINGS: Rotator cuff: Significant rotator cuff tendinopathy/tendinosis. This most notably involves the supraspinatus and subscapularis tendons. There is a fairly extensive articular surface tear involving the supraspinatus tendon in its mid and anterior portions. Maximum laminar retraction of the articular fibers is 16.5 mm. I do not see any definite full-thickness tear. Suspect complete tear involving the upper fibers of the subscapularis tendon  with maximum retraction of 16 mm. Muscles:  No significant findings. Biceps long head: Intact but severe tendinopathy involving the intra-articular portion. Acromioclavicular Joint: Moderate degenerative changes. Type 2-3 acromion. No lateral downsloping or undersurface spurring. Glenohumeral Joint: Mild degenerative changes. Small joint effusion and mild synovitis. Labrum:  Labral degenerative changes without definite tear. Bones:  No acute bony findings. Other: Expected fluid in the subacromial/subdeltoid bursa. IMPRESSION: 1. Significant rotator cuff tendinopathy/tendinosis. 2. Fairly extensive articular surface tear involving the mid and anterior portions of the supraspinatus tendon. 3. Suspect complete tear involving the upper fibers of the subscapularis tendon. 4. Intact long head biceps but significant tendinopathy. 5. Labral degenerative changes without definite tear. Electronically Signed   By: Marijo Sanes M.D.   On: 03/04/2019 15:43    Discharge Instructions    Call MD / Call 911   Complete by: As directed    If you experience chest pain or shortness of breath, CALL 911 and be transported to the hospital emergency room.  If you develope a fever above 101 F, pus (white drainage) or increased drainage  or redness at the wound, or calf pain, call your surgeon's office.   Constipation Prevention   Complete by: As directed    Drink plenty of fluids.  Prune juice may be helpful.  You may use a stool softener, such as Colace (over the counter) 100 mg twice a day.  Use MiraLax (over the counter) for constipation as needed.   Diet - low sodium heart healthy   Complete by: As directed    Discharge instructions   Complete by: As directed    No lifting greater than 10 lbs. Avoid bending, stooping and twisting. Walk in house for first week them may start to get out slowly increasing distance up to one mile by 3 weeks post op. Keep incision dry for 3 days, may use tegaderm or similar water impervious  dressing.   Driving restrictions   Complete by: As directed    No driving for 6 weeks   Increase activity slowly as tolerated   Complete by: As directed    Lifting restrictions   Complete by: As directed    No lifting for 8 weeks      Follow-up Information    Jessy Oto, MD In 2 weeks.   Specialty: Orthopedic Surgery Why: For wound re-check Contact information: [address] [place] [address] [phone]           Discharge Plan:  discharge to home  Disposition:     Signed: Benjiman Core  03/30/2019, 10:14 AM

## 2019-03-31 ENCOUNTER — Telehealth: Payer: Self-pay | Admitting: Specialist

## 2019-03-31 ENCOUNTER — Other Ambulatory Visit: Payer: Self-pay | Admitting: Specialist

## 2019-03-31 MED ORDER — OXYCODONE-ACETAMINOPHEN 7.5-325 MG PO TABS: 2.0000 | ORAL_TABLET | Freq: Every day | ORAL | 0 refills | Status: DC | PRN

## 2019-03-31 NOTE — Telephone Encounter (Signed)
I called and advised meds were sent to [facility]

## 2019-03-31 NOTE — Telephone Encounter (Signed)
Patient called in for updates for refill of hydrocodone. Please give patient a call back. Please send refill to pharmacy on file. Patient phone number is [phone].

## 2019-04-01 ENCOUNTER — Inpatient Hospital Stay: Payer: Medicare HMO | Admitting: Specialist

## 2019-04-05 ENCOUNTER — Ambulatory Visit (INDEPENDENT_AMBULATORY_CARE_PROVIDER_SITE_OTHER): Payer: Medicare HMO | Admitting: Specialist

## 2019-04-05 ENCOUNTER — Encounter: Payer: Self-pay | Admitting: Specialist

## 2019-04-05 ENCOUNTER — Other Ambulatory Visit: Payer: Self-pay

## 2019-04-05 VITALS — BP 135/81 | HR 97 | Ht 65.0 in | Wt 206.0 lb

## 2019-04-05 DIAGNOSIS — Z9889 Other specified postprocedural states: Secondary | ICD-10-CM

## 2019-04-05 NOTE — Progress Notes (Signed)
Post-Op Visit Note   Patient: Olivia Ewing           Date of Birth: 02-28-1950           MRN: [id number] Visit Date: 04/05/2019 PCP: Katherine Roan, MD   Assessment & Plan:2 1/2 weeks post multilevel lumbar central laminectomies for severe lumbar spinal stenosis.  Chief Complaint:  Chief Complaint  Patient presents with  . Lower Back - Routine Post Op  Incision is healed. No erythrema or drainage. Mild swelling  SLR is negative. Motor in both legs.  Using a walker with wheels    Visit Diagnoses: No diagnosis found.  Plan: Avoid frequent bending and stooping  No lifting greater than 10 lbs. May use ice or moist heat for pain. Weight loss is of benefit. Best medication for lumbar disc disease is arthritis medications like motrin, celebrex and naprosyn. Exercise is important to improve your indurance and does allow people to function better inspite of back pain.    Follow-Up Instructions: No follow-ups on file.   Orders:  No orders of the defined types were placed in this encounter.  No orders of the defined types were placed in this encounter.   Imaging: No results found.  PMFS History: Patient Active Problem List   Diagnosis Date Noted  . Spinal stenosis, lumbar region, with neurogenic claudication 03/16/2019    Priority: High    Class: Chronic  . Status post lumbar laminectomy 03/16/2019  . Acute stress reaction 09/25/2018  . Primary osteoarthritis of right hip 04/27/2018  . Status post total hip replacement, 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  . Shortness of breath 01/02/2018  . Chalazion left upper eyelid 02/20/2017  . Vitamin D insufficiency 11/25/2013  . Healthcare maintenance 11/25/2013  . Spinal stenosis of lumbar region 07/02/2013  . Arthritis, degenerative 11/17/2012  . Baker's cyst of knee 06/05/2012  . Rectocele 04/28/2012  . Left knee DJD, degenerative meniscus tear 04/06/2012  . Numbness and tingling in hands 03/17/2012  . Periodontitis 09/23/2011    . Depression 09/23/2011  . Right knee meniscal tear 03/14/2011  . Lumbar radiculopathy 03/14/2011  . Stress incontinence of urine 10/18/2010  . NECK PAIN, CHRONIC 08/19/2008  . Normocytic anemia 05/12/2008  . SHOULDER PAIN, RIGHT 04/28/2008  . Hyperlipemia 06/18/2007  . Essential hypertension 05/20/2007   Past Medical History:  Diagnosis Date  . Anemia   . Arthritis    knees hands  . Bipolar disorder ([place])   . Cervicalgia   . Depression   . Environmental allergies    cause SOB, uses inhaler for  . Environmental and seasonal allergies    uses inhaler prn  . Full dentures   . GERD (gastroesophageal reflux disease)    diet controlled - no meds  . Hyperlipidemia    diet controlled, no meds  . Hypertension   . Lumbar radiculopathy, chronic   . Right knee pain    posterior horn medial meniscal tear MRI 2013  . Spinal stenosis    getting epidural injections -last one 06/12/2015  . SVD (spontaneous vaginal delivery)    x 4    Family History  Problem Relation Age of Onset  . Hypertension Mother     Past Surgical History:  Procedure Laterality Date  . APPENDECTOMY    . Bladder tack  10/2006   cystocoele  . BREAST SURGERY Right    benign cyst  . COLONOSCOPY    . Hysterectomy other    . LUMBAR LAMINECTOMY N/A 03/16/2019   Procedure:  CENTRAL LAMINECTOMIES L2-3, L3-4 AND L4-5;  Surgeon: Jessy Oto, MD;  Location: [facility];  Service: Orthopedics;  Laterality: N/A;  . TOTAL HIP ARTHROPLASTY Right 04/27/2018   Procedure: RIGHT TOTAL HIP ARTHROPLASTY ANTERIOR APPROACH;  Surgeon: Leandrew Koyanagi, MD;  Location: [place];  Service: Orthopedics;  Laterality: Right;  . TUBAL LIGATION     Social History   Occupational History  . Not on file  Tobacco Use  . Smoking status: Former Smoker    Packs/day: 0.10    Quit date: 11/26/1983    Years since quitting: 35.3  . Smokeless tobacco: Never Used  Substance and Sexual Activity  . Alcohol use: Yes    Alcohol/week: 0.0 standard drinks     Comment: rarely  . Drug use: Not Currently    Types: Marijuana    Comment: last used 2019  . Sexual activity: Not Currently    Birth control/protection: Surgical

## 2019-04-06 ENCOUNTER — Telehealth: Payer: Self-pay | Admitting: Specialist

## 2019-04-06 NOTE — Telephone Encounter (Signed)
Margaretha Sheffield, PT working with patient, called. She says patient refused today.

## 2019-04-06 NOTE — Telephone Encounter (Signed)
noted 

## 2019-04-07 ENCOUNTER — Ambulatory Visit: Payer: Medicare HMO | Admitting: Orthopedic Surgery

## 2019-04-12 ENCOUNTER — Ambulatory Visit: Payer: Medicare HMO | Admitting: Orthopedic Surgery

## 2019-04-15 ENCOUNTER — Telehealth: Payer: Self-pay | Admitting: Specialist

## 2019-04-15 NOTE — Telephone Encounter (Signed)
Katina Degree from [facility] called. Mrs. [name] called to report patient missed Pt visit today 04/15/2019. Olivia Ewing stated patient stated to be sick. Katina Degree phone number is [phone] if any questions arise.

## 2019-04-19 ENCOUNTER — Other Ambulatory Visit: Payer: Self-pay | Admitting: Specialist

## 2019-04-19 MED ORDER — OXYCODONE-ACETAMINOPHEN 7.5-325 MG PO TABS: 1.0000 | ORAL_TABLET | Freq: Four times a day (QID) | ORAL | 0 refills | Status: DC | PRN

## 2019-04-19 NOTE — Telephone Encounter (Signed)
Patient called and requested Oxycodone refill.  Please call patient [phone]

## 2019-04-20 ENCOUNTER — Telehealth: Payer: Self-pay | Admitting: Specialist

## 2019-04-20 NOTE — Telephone Encounter (Signed)
Lmom for pt to call us back to get her in to be seen for the "leaking" from her incision. We can discuss the brace at the appt, she would have to be fitted for it so she would have to be present if they decide to order it.

## 2019-04-20 NOTE — Telephone Encounter (Signed)
Pt called in stating she had surgery on 03-16-19 with Dr. Louanne Skye and sometimes her wound still leaks. The pt was asking if we order a backbrace for her and if so would her aunt be able to pick it up?  [phone]

## 2019-04-23 ENCOUNTER — Telehealth: Payer: Self-pay | Admitting: Specialist

## 2019-04-23 NOTE — Telephone Encounter (Signed)
Patient called back and stated she received a call from [facility] about making an appt for a leak. Patient states that message was taken wrong. She call about getting a brace.  States she has appts coming up and to disregard the message she will see Erlinda Hong & Flavia Shipper this month.

## 2019-04-23 NOTE — Telephone Encounter (Signed)
I called and left another message for her to call back to schedule an appt for her back next week for the "leaking" she is saying she has.

## 2019-04-23 NOTE — Telephone Encounter (Signed)
Can you try to call her later today and see if she can come in with Olivia Ewing next week to have her wound checked?

## 2019-04-23 NOTE — Telephone Encounter (Signed)
Kisha,  Please disregard previous message on this patient

## 2019-04-27 ENCOUNTER — Ambulatory Visit: Payer: Self-pay

## 2019-04-27 ENCOUNTER — Ambulatory Visit (INDEPENDENT_AMBULATORY_CARE_PROVIDER_SITE_OTHER): Payer: Medicare HMO | Admitting: Orthopaedic Surgery

## 2019-04-27 ENCOUNTER — Encounter: Payer: Self-pay | Admitting: Orthopaedic Surgery

## 2019-04-27 ENCOUNTER — Other Ambulatory Visit: Payer: Self-pay

## 2019-04-27 VITALS — Ht 65.0 in | Wt 200.0 lb

## 2019-04-27 DIAGNOSIS — M5412 Radiculopathy, cervical region: Secondary | ICD-10-CM | POA: Diagnosis not present

## 2019-04-27 DIAGNOSIS — M25511 Pain in right shoulder: Secondary | ICD-10-CM | POA: Diagnosis not present

## 2019-04-27 DIAGNOSIS — Z96641 Presence of right artificial hip joint: Secondary | ICD-10-CM | POA: Diagnosis not present

## 2019-04-27 MED ORDER — OXYCODONE-ACETAMINOPHEN 7.5-325 MG PO TABS: ORAL_TABLET | ORAL | 0 refills | Status: DC

## 2019-04-27 NOTE — Progress Notes (Signed)
Office Visit Note   Patient: Olivia Ewing           Date of Birth: 1950/11/30           MRN: [id number] Visit Date: 04/27/2019              Requested by: Katherine Roan, MD [place],  [place] [id number] PCP: Katherine Roan, MD   Assessment & Plan: Visit Diagnoses:  1. Status post total hip replacement, right   2. Right shoulder pain, unspecified chronicity   3. Radiculopathy of cervical spine     Plan: Impression is chronic right upper extremity pain likely from underlying shoulder and cervical spine pathology.  We will first refer the patient to Dr. Junius Roads for an ultrasound-guided cortisone injection to the glenohumeral joint.  She does note that she has an upcoming appointment with Dr. Donavan Burnet for her back and she will discuss recent cervical spine MRI findings with him at that appointment.  Follow-up with [name] as needed.  Follow-Up Instructions: No follow-ups on file.   Orders:  Orders Placed This Encounter  Procedures  . XR HIP UNILAT W OR W/O PELVIS 2-3 VIEWS RIGHT  . US Guided Needle Placement   No orders of the defined types were placed in this encounter.     Procedures: No procedures performed   Clinical Data: No additional findings.   Subjective: Chief Complaint  Patient presents with  . Right Shoulder - Pain  . Right Hip - Follow-up    Right THA DOS 04/27/2018    HPI patient is a pleasant 69 year old female who comes in today 1 year out right total hip replacement 04/27/2018.  She has been doing excellent.  No complaints.  She was having some right-sided radicular pain following an injury after Tylenol her right hip and right shoulder back in July.  She has since had surgery on her back by Dr. Louanne Skye.  This has relieved all of her pain to the right lower extremity.  Other issue she brings up today is her right shoulder and right sided neck pain.  She has had pain to the top of the right shoulder following the injury in July.  She has recently  been seen by Dr. Micheline Chapman with an MRI of the neck and shoulder were ordered.  The pain is that she has are primarily to the right parascapular and anterior shoulder.  She has tingling in all 5 fingers as well as occasional numbness to the entire arm.  She does note some weakness into her hand.  She has increased pain lifting her arm as well as moving it behind her back.  No previous cortisone injection in her shoulder or neck.   Review of Systems as detailed in HPI.  All other reviewed and are negative.   Objective: Vital Signs: Ht 5\' 5"  (1.651 m)   Wt 200 lb (90.7 kg)   BMI 33.28 kg/m   Physical Exam well-developed well-nourished female no acute distress.  Alert and oriented x3.  Ortho Exam examination of her cervical spine reveals increased pain with range of motion in all planes.  She has increased pain to the right-sided parascapular region.  Right shoulder exam shows only about 50% active range of motion.  She can internally rotate to her back pocket.  Positive empty can and cross body abduction.  Positive speeds and Gerber liftoff test..  She does have moderate weakness throughout.  Decreased sensation to the right hand.  Fingers are  warm and well-perfused.  Specialty Comments:  No specialty comments available.  Imaging: MRI of the right shoulder from January 2021 shows supraspinatus and subscapularis tendon tears as well as significant biceps tendinopathy.  MRI of the cervical spine shows diffuse spondylosis.   PMFS History: Patient Active Problem List   Diagnosis Date Noted  . Spinal stenosis, lumbar region, with neurogenic claudication 03/16/2019    Class: Chronic  . Status post lumbar laminectomy 03/16/2019  . Acute stress reaction 09/25/2018  . Primary osteoarthritis of right hip 04/27/2018  . Status post total hip replacement, 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  . Shortness of breath 01/02/2018  . Chalazion left upper eyelid 02/20/2017  . Vitamin D insufficiency 11/25/2013  . Healthcare  maintenance 11/25/2013  . Spinal stenosis of lumbar region 07/02/2013  . Arthritis, degenerative 11/17/2012  . Baker's cyst of knee 06/05/2012  . Rectocele 04/28/2012  . Left knee DJD, degenerative meniscus tear 04/06/2012  . Numbness and tingling in hands 03/17/2012  . Periodontitis 09/23/2011  . Depression 09/23/2011  . Right knee meniscal tear 03/14/2011  . Lumbar radiculopathy 03/14/2011  . Stress incontinence of urine 10/18/2010  . NECK PAIN, CHRONIC 08/19/2008  . Normocytic anemia 05/12/2008  . SHOULDER PAIN, RIGHT 04/28/2008  . Hyperlipemia 06/18/2007  . Essential hypertension 05/20/2007   Past Medical History:  Diagnosis Date  . Anemia   . Arthritis    knees hands  . Bipolar disorder ([place])   . Cervicalgia   . Depression   . Environmental allergies    cause SOB, uses inhaler for  . Environmental and seasonal allergies    uses inhaler prn  . Full dentures   . GERD (gastroesophageal reflux disease)    diet controlled - no meds  . Hyperlipidemia    diet controlled, no meds  . Hypertension   . Lumbar radiculopathy, chronic   . Right knee pain    posterior horn medial meniscal tear MRI 2013  . Spinal stenosis    getting epidural injections -last one 06/12/2015  . SVD (spontaneous vaginal delivery)    x 4    Family History  Problem Relation Age of Onset  . Hypertension Mother     Past Surgical History:  Procedure Laterality Date  . APPENDECTOMY    . Bladder tack  10/2006   cystocoele  . BREAST SURGERY Right    benign cyst  . COLONOSCOPY    . Hysterectomy other    . LUMBAR LAMINECTOMY N/A 03/16/2019   Procedure: CENTRAL LAMINECTOMIES L2-3, L3-4 AND L4-5;  Surgeon: Jessy Oto, MD;  Location: [facility];  Service: Orthopedics;  Laterality: N/A;  . TOTAL HIP ARTHROPLASTY Right 04/27/2018   Procedure: RIGHT TOTAL HIP ARTHROPLASTY ANTERIOR APPROACH;  Surgeon: Leandrew Koyanagi, MD;  Location: [facility];  Service: Orthopedics;  Laterality: Right;  . TUBAL LIGATION     Social  History   Occupational History  . Not on file  Tobacco Use  . Smoking status: Former Smoker    Packs/day: 0.10    Quit date: 11/26/1983    Years since quitting: 35.4  . Smokeless tobacco: Never Used  Substance and Sexual Activity  . Alcohol use: Yes    Alcohol/week: 0.0 standard drinks    Comment: rarely  . Drug use: Not Currently    Types: Marijuana    Comment: last used 2019  . Sexual activity: Not Currently    Birth control/protection: Surgical

## 2019-04-27 NOTE — Progress Notes (Signed)
Subjective: Patient is here for ultrasound-guided intra-articular right glenohumeral injection.  Pain since having a ceiling tile land on her.  Objective:  Pain with overhead reach.  Procedure: Ultrasound-guided right glenohumeral injection: After sterile prep with Betadine, injected 8 cc 1% lidocaine without epinephrine and 40 mg methylprednisolone using a 22-gauge spinal needle, passing the needle through approach into the glenohumeral joint.  Injectate seen filling joint capsule.

## 2019-04-28 ENCOUNTER — Telehealth: Payer: Self-pay | Admitting: Physician Assistant

## 2019-04-28 NOTE — Telephone Encounter (Signed)
Please call patient. Thanks.

## 2019-04-28 NOTE — Telephone Encounter (Signed)
Patient called and requesting that Ria Comment call her. She has questions in regards to left shoulder.  Please call patient to advise.  [phone]

## 2019-05-03 ENCOUNTER — Ambulatory Visit (INDEPENDENT_AMBULATORY_CARE_PROVIDER_SITE_OTHER): Payer: Medicare HMO | Admitting: Specialist

## 2019-05-03 ENCOUNTER — Encounter: Payer: Self-pay | Admitting: Specialist

## 2019-05-03 ENCOUNTER — Other Ambulatory Visit: Payer: Self-pay

## 2019-05-03 VITALS — BP 125/87 | HR 89 | Ht 65.0 in | Wt 200.0 lb

## 2019-05-03 DIAGNOSIS — Z9889 Other specified postprocedural states: Secondary | ICD-10-CM

## 2019-05-03 DIAGNOSIS — M48062 Spinal stenosis, lumbar region with neurogenic claudication: Secondary | ICD-10-CM

## 2019-05-03 DIAGNOSIS — M4722 Other spondylosis with radiculopathy, cervical region: Secondary | ICD-10-CM

## 2019-05-03 DIAGNOSIS — G5603 Carpal tunnel syndrome, bilateral upper limbs: Secondary | ICD-10-CM

## 2019-05-03 DIAGNOSIS — G5621 Lesion of ulnar nerve, right upper limb: Secondary | ICD-10-CM

## 2019-05-03 MED ORDER — OXYCODONE-ACETAMINOPHEN 7.5-325 MG PO TABS: ORAL_TABLET | ORAL | 0 refills | Status: DC

## 2019-05-03 NOTE — Telephone Encounter (Signed)
Will you let her know we are in surgery all day and see if you can take a message?

## 2019-05-03 NOTE — Patient Instructions (Addendum)
Avoid frequent bending and stooping  No lifting greater than 10 lbs. May use ice or moist heat for pain. Weight loss is of benefit. Best medication for lumbar disc disease is arthritis medications like motrin, celebrex and naprosyn. Exercise is important to improve your indurance and does allow people to function better inspite of back pain.  Hemp CBD capsules, amazon.com 5,000-7,000 mg per bottle, 60 capsules per bottle, take one capsule twice a day. Rx for pain med renewed one last time. EMG/NCV bilateral UE to assess for nerve compression vs radiculopathy/ Follow-Up Instructions: No follow-ups on file.

## 2019-05-03 NOTE — Progress Notes (Signed)
Post-Op Visit Note   Patient: Olivia Ewing           Date of Birth: 1950/09/03           MRN: [id number] Visit Date: 05/03/2019 PCP: Katherine Roan, MD   Assessment & Plan:7 weeks post op L2 to L5 bilateral partial hemilaminectomies  Chief Complaint:  Chief Complaint  Patient presents with  . Lower Back - Follow-up  Motor is normal. Incision is healed. SLR is negative.  EXAM: MRI CERVICAL SPINE WITHOUT CONTRAST  TECHNIQUE: Multiplanar, multisequence MR imaging of the cervical spine was performed. No intravenous contrast was administered.  COMPARISON:  04/09/2007  FINDINGS: Alignment: 1-2 mm anterolisthesis of C7 on T1.  Vertebrae: No fracture, evidence of discitis, or bone lesion.  Cord: Normal signal and morphology.  Posterior Fossa, vertebral arteries, paraspinal tissues: Posterior fossa demonstrates no focal abnormality. Vertebral artery flow voids are maintained. Paraspinal soft tissues are unremarkable.  Disc levels:  Disc spaces: Degenerative disc disease with disc height loss at C5-6  C2-3: No significant disc bulge. No neural foraminal stenosis. No central canal stenosis.  C3-4: Small central disc protrusion. Mild bilateral facet arthropathy. Mild bilateral foraminal stenosis. No central canal stenosis.  C4-5: Broad-based disc bulge. Mild bilateral foraminal stenosis. Mild bilateral facet arthropathy. No central canal stenosis.  C5-6: Broad-based disc osteophyte complex. Bilateral uncovertebral degenerative changes. Severe bilateral foraminal stenosis. Mild spinal stenosis.  C6-7: No significant disc bulge. No neural foraminal stenosis. No central canal stenosis.  C7-T1: Broad-based disc bulge. Severe right and moderate left facet arthropathy. Severe right foraminal stenosis. Moderate-severe left foraminal stenosis. Mild spinal stenosis.  IMPRESSION: 1. Diffuse cervical spine spondylosis as described above. 2.  No  acute osseous injury of the cervical spine.   Electronically Signed   By: Kathreen Devoid   On: 03/04/2019 15:58  Visit Diagnoses: No diagnosis found.  Plan: Avoid frequent bending and stooping  No lifting greater than 10 lbs. May use ice or moist heat for pain. Weight loss is of benefit. Best medication for lumbar disc disease is arthritis medications like motrin, celebrex and naprosyn. Exercise is important to improve your indurance and does allow people to function better inspite of back pain.  Hemp CBD capsules, amazon.com 5,000-7,000 mg per bottle, 60 capsules per bottle, take one capsule twice a day. Rx for pain med renewed one last time.    Follow-Up Instructions: No follow-ups on file.   Orders:  No orders of the defined types were placed in this encounter.  No orders of the defined types were placed in this encounter.   Imaging: No results found.  PMFS History: Patient Active Problem List   Diagnosis Date Noted  . Spinal stenosis, lumbar region, with neurogenic claudication 03/16/2019    Priority: High    Class: Chronic  . Status post lumbar laminectomy 03/16/2019  . Acute stress reaction 09/25/2018  . Primary osteoarthritis of right hip 04/27/2018  . Status post total hip replacement, 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  . Shortness of breath 01/02/2018  . Chalazion left upper eyelid 02/20/2017  . Vitamin D insufficiency 11/25/2013  . Healthcare maintenance 11/25/2013  . Spinal stenosis of lumbar region 07/02/2013  . Arthritis, degenerative 11/17/2012  . Baker's cyst of knee 06/05/2012  . Rectocele 04/28/2012  . Left knee DJD, degenerative meniscus tear 04/06/2012  . Numbness and tingling in hands 03/17/2012  . Periodontitis 09/23/2011  . Depression 09/23/2011  . Right knee meniscal tear 03/14/2011  . Lumbar radiculopathy 03/14/2011  . Stress  incontinence of urine 10/18/2010  . NECK PAIN, CHRONIC 08/19/2008  . Normocytic anemia 05/12/2008  . SHOULDER PAIN, RIGHT  04/28/2008  . Hyperlipemia 06/18/2007  . Essential hypertension 05/20/2007   Past Medical History:  Diagnosis Date  . Anemia   . Arthritis    knees hands  . Bipolar disorder ([place])   . Cervicalgia   . Depression   . Environmental allergies    cause SOB, uses inhaler for  . Environmental and seasonal allergies    uses inhaler prn  . Full dentures   . GERD (gastroesophageal reflux disease)    diet controlled - no meds  . Hyperlipidemia    diet controlled, no meds  . Hypertension   . Lumbar radiculopathy, chronic   . Right knee pain    posterior horn medial meniscal tear MRI 2013  . Spinal stenosis    getting epidural injections -last one 06/12/2015  . SVD (spontaneous vaginal delivery)    x 4    Family History  Problem Relation Age of Onset  . Hypertension Mother     Past Surgical History:  Procedure Laterality Date  . APPENDECTOMY    . Bladder tack  10/2006   cystocoele  . BREAST SURGERY Right    benign cyst  . COLONOSCOPY    . Hysterectomy other    . LUMBAR LAMINECTOMY N/A 03/16/2019   Procedure: CENTRAL LAMINECTOMIES L2-3, L3-4 AND L4-5;  Surgeon: Jessy Oto, MD;  Location: [facility];  Service: Orthopedics;  Laterality: N/A;  . TOTAL HIP ARTHROPLASTY Right 04/27/2018   Procedure: RIGHT TOTAL HIP ARTHROPLASTY ANTERIOR APPROACH;  Surgeon: Leandrew Koyanagi, MD;  Location: [facility];  Service: Orthopedics;  Laterality: Right;  . TUBAL LIGATION     Social History   Occupational History  . Not on file  Tobacco Use  . Smoking status: Former Smoker    Packs/day: 0.10    Quit date: 11/26/1983    Years since quitting: 35.4  . Smokeless tobacco: Never Used  Substance and Sexual Activity  . Alcohol use: Yes    Alcohol/week: 0.0 standard drinks    Comment: rarely  . Drug use: Not Currently    Types: Marijuana    Comment: last used 2019  . Sexual activity: Not Currently    Birth control/protection: Surgical

## 2019-05-06 NOTE — Telephone Encounter (Signed)
Called patient twice no answer LMOM to return our call.

## 2019-05-12 ENCOUNTER — Encounter: Payer: Self-pay | Admitting: Licensed Clinical Social Worker

## 2019-05-12 ENCOUNTER — Ambulatory Visit (INDEPENDENT_AMBULATORY_CARE_PROVIDER_SITE_OTHER): Payer: Medicare HMO | Admitting: Licensed Clinical Social Worker

## 2019-05-12 ENCOUNTER — Other Ambulatory Visit: Payer: Self-pay

## 2019-05-12 DIAGNOSIS — F430 Acute stress reaction: Secondary | ICD-10-CM

## 2019-05-12 NOTE — BH Specialist Note (Signed)
Integrated Behavioral Health Visit via Telemedicine (Telephone)  05/12/2019 CAYLE CORDOBA [id number]   Session Start time: 1:25  Session End time: 1:45 Total time: 20 minutes  Referring Provider: Dr. Shan Levans Type of Visit: Telephonic Patient location: Home [facility] Provider location: office All persons participating in visit: patient and [facility]  Confirmed patient's address: Yes  Confirmed patient's phone number: Yes  Any changes to demographics: No   Discussed confidentiality: Yes    The following statements were read to the patient and/or legal guardian that are established with the [facility] Provider.  "The purpose of this phone visit is to provide behavioral health care while limiting exposure to the coronavirus (COVID19).  There is a possibility of technology failure and discussed alternative modes of communication if that failure occurs."  "By engaging in this telephone visit, you consent to the provision of healthcare.  Additionally, you authorize for your insurance to be billed for the services provided during this telephone visit."   Patient and/or legal guardian consented to telephone visit: Yes   PRESENTING CONCERNS: Patient and/or family reports the following symptoms/concerns:  panic, acute stress that has transitioned to anxiety, history of trauma, mood instability, grief, anger, sleep challenges, and financial issues.  Duration of problem: since July 2010; Severity of problem: mild- moderate GOALS ADDRESSED: Patient will: 1.  Reduce symptoms of: anxiety, depression and stress  2.  Increase knowledge and/or ability of: coping skills, healthy habits and stress reduction  3.  Demonstrate ability to: Increase healthy adjustment to current life circumstances and Increase adequate support systems for patient/family  INTERVENTIONS: Interventions utilized:  Brief CBT and Supportive Counseling Standardized Assessments completed: assessed for SI, HI, and  self-harm.  ASSESSMENT: Patient currently experiencing mild stress and depression. Patient had back surgery, and her pain has decreased significantly. Patient's mood has improved since having the surgery. Patient reported she is much calmer now than when we previously met. Patient is still having challenges with her family, but she is coping much better. Patient reports a decrease in the amount of crying spells.  Patient may benefit from counseling.  PLAN: 1. Follow up with behavioral health clinician on : three weeks.  Dessie Coma, [facility], [place]

## 2019-05-17 ENCOUNTER — Other Ambulatory Visit: Payer: Self-pay | Admitting: Specialist

## 2019-05-17 NOTE — Telephone Encounter (Signed)
Patient called. She would like oxycodone called in for pain. Her call back number is [phone]. Also would like for Dr. Louanne Skye to know that the pain is worst.

## 2019-05-18 ENCOUNTER — Other Ambulatory Visit: Payer: Self-pay | Admitting: Specialist

## 2019-05-18 MED ORDER — HYDROCODONE-ACETAMINOPHEN 5-325 MG PO TABS: 1.0000 | ORAL_TABLET | Freq: Every day | ORAL | 0 refills | Status: DC | PRN

## 2019-05-21 ENCOUNTER — Encounter: Payer: Medicare HMO | Admitting: Physical Medicine and Rehabilitation

## 2019-05-28 ENCOUNTER — Encounter: Payer: Self-pay | Admitting: Specialist

## 2019-05-28 ENCOUNTER — Ambulatory Visit (INDEPENDENT_AMBULATORY_CARE_PROVIDER_SITE_OTHER): Payer: Medicare HMO | Admitting: Specialist

## 2019-05-28 ENCOUNTER — Other Ambulatory Visit: Payer: Self-pay

## 2019-05-28 VITALS — BP 128/79 | HR 92 | Ht 65.0 in | Wt 200.0 lb

## 2019-05-28 DIAGNOSIS — M5416 Radiculopathy, lumbar region: Secondary | ICD-10-CM

## 2019-05-28 DIAGNOSIS — G5603 Carpal tunnel syndrome, bilateral upper limbs: Secondary | ICD-10-CM

## 2019-05-28 DIAGNOSIS — M4722 Other spondylosis with radiculopathy, cervical region: Secondary | ICD-10-CM

## 2019-05-28 DIAGNOSIS — M5136 Other intervertebral disc degeneration, lumbar region: Secondary | ICD-10-CM

## 2019-05-28 DIAGNOSIS — G5621 Lesion of ulnar nerve, right upper limb: Secondary | ICD-10-CM

## 2019-05-28 DIAGNOSIS — Z96641 Presence of right artificial hip joint: Secondary | ICD-10-CM

## 2019-05-28 NOTE — Patient Instructions (Addendum)
Avoid bending, stooping and avoid lifting weights greater than 5-10 lbs. Avoid prolong standing and walking. Avoid frequent bending and stooping  No lifting greater than 15-10 lbs. May use ice or moist heat for pain. Weight loss is of benefit. Handicap license is approved. Avoid frequent bending and stooping  No lifting greater than 5-10 lbs. May use ice or moist heat for pain. Weight loss is of benefit. Best medication for lumbar disc disease is arthritis medications like motrin, celebrex and naprosyn. Exercise is important to improve your indurance and does allow people to function better inspite of back pain.  Hemp CBD capsules, amazon.com 5,000-7,000 mg per bottle, 60 capsules per bottle, take one capsule twice a day. Rx for pain med renewed one last time. EMG/NCV bilateral UE to assess for nerve compression vs radiculopathy/ Follow-Up Instructions: No follow-ups on file.

## 2019-05-28 NOTE — Progress Notes (Signed)
Office Visit Note   Patient: Olivia Ewing           Date of Birth: Jan 27, 1951           MRN: [id number] Visit Date: 05/28/2019              Requested by: Katherine Roan, MD [place],  [place] [id number] PCP: Katherine Roan, MD   Assessment & Plan: Visit Diagnoses: No diagnosis found.  Plan: Avoid bending, stooping and avoid lifting weights greater than 5-10 lbs. Avoid prolong standing and walking. Avoid frequent bending and stooping  No lifting greater than 15-10 lbs. May use ice or moist heat for pain. Weight loss is of benefit. Handicap license is approved. Avoid frequent bending and stooping  No lifting greater than 5-10 lbs. May use ice or moist heat for pain. Weight loss is of benefit. Best medication for lumbar disc disease is arthritis medications like motrin, celebrex and naprosyn. Exercise is important to improve your indurance and does allow people to function better inspite of back pain.  Hemp CBD capsules, amazon.com 5,000-7,000 mg per bottle, 60 capsules per bottle, take one capsule twice a day. Rx for pain med renewed one last time. EMG/NCV bilateral UE to assess for nerve compression vs radiculopathy/ Follow-Up Instructions: No follow-ups on file.   Follow-Up Instructions: No follow-ups on file.   Orders:  No orders of the defined types were placed in this encounter.  No orders of the defined types were placed in this encounter.     Procedures: No procedures performed   Clinical Data: No additional findings.   Subjective: Chief Complaint  Patient presents with  . Lower Back - Follow-up    69 year old female right handed. She is experiencing weakness and numbness into both hands. Recent central laminectomy, L2-3, L3-4 and L4-5. She missed her EMG/NCV study appointment with Dr. Ernestina Patches and is planning to reschedule the appointment. 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 She needs a note with restrictions. No bowel or bladder dysfunction.    Review of Systems  Constitutional: Positive for activity change and unexpected weight change. Negative for appetite change, chills, diaphoresis, fatigue and fever.  HENT: Negative.  Negative for congestion, dental problem, drooling, ear discharge, ear pain, facial swelling, hearing loss, mouth sores, nosebleeds, postnasal drip, rhinorrhea, sinus pressure, sinus pain, sneezing, sore throat, tinnitus, trouble swallowing and voice change.   Eyes: Negative for photophobia, pain, discharge, redness, itching and visual disturbance.  Respiratory: Negative.  Negative for apnea, cough, choking, chest tightness, shortness of breath, wheezing and stridor.   Cardiovascular: Negative.  Negative for chest pain, palpitations and leg swelling.  Gastrointestinal: Negative.  Negative for abdominal distention, abdominal pain, anal bleeding, blood in stool, constipation, diarrhea, nausea, rectal pain and vomiting.  Endocrine: Negative for cold intolerance, heat intolerance, polydipsia, polyphagia and polyuria.  Genitourinary: Negative.  Negative for decreased urine volume, difficulty urinating, dyspareunia, dysuria, enuresis, flank pain, frequency, genital sores, hematuria, menstrual problem, pelvic pain, urgency, vaginal bleeding, vaginal discharge and vaginal pain.  Musculoskeletal: Positive for back pain, gait problem, neck pain and neck stiffness.  Skin: Negative.  Negative for color change, pallor, rash and wound.  Allergic/Immunologic: Negative for environmental allergies, food allergies and immunocompromised state.  Neurological: Positive for weakness and numbness. Negative for dizziness, tremors, seizures, syncope, facial asymmetry, speech difficulty, light-headedness and headaches.  Hematological: Negative for adenopathy. Does not bruise/bleed easily.  Psychiatric/Behavioral:  Negative.      Objective: Vital Signs: BP 128/79   Pulse 92   Ht 5\' 5"  (1.651 m)   Wt 200 lb (90.7 kg)   BMI 33.28 kg/m   Physical Exam Constitutional:      Appearance: She is well-developed.  HENT:     Head: Normocephalic and atraumatic.  Eyes:     Pupils: Pupils are equal, round, and reactive to light.  Pulmonary:     Effort: Pulmonary effort is normal.     Breath sounds: Normal breath sounds.  Abdominal:     General: Bowel sounds are normal.     Palpations: Abdomen is soft.  Musculoskeletal:     Cervical back: Normal range of motion and neck supple.     Lumbar back: Negative right straight leg raise test and negative left straight leg raise test.  Skin:    General: Skin is warm and dry.  Neurological:     Mental Status: She is alert and oriented to person, place, and time.  Psychiatric:        Behavior: Behavior normal.        Thought Content: Thought content normal.        Judgment: Judgment normal.     Back Exam   Tenderness  The patient is experiencing tenderness in the lumbar.  Range of Motion  Extension: abnormal  Flexion: normal  Lateral bend right: abnormal  Lateral bend left: abnormal  Rotation right: abnormal  Rotation left: abnormal   Muscle Strength  Right Quadriceps:  5/5  Left Quadriceps:  5/5  Right Hamstrings:  5/5  Left Hamstrings:  5/5   Tests  Straight leg raise right: negative Straight leg raise left: negative  Reflexes  Patellar: 0/4 Achilles: 0/4 Babinski's sign: normal   Other  Toe walk: normal Heel walk: normal Gait: normal  Erythema: no back redness Scars: absent      Specialty Comments:  No specialty comments available.  Imaging: No results found.   PMFS History: Patient Active Problem List   Diagnosis Date Noted  . Spinal stenosis, lumbar region, with neurogenic claudication 03/16/2019    Priority: High    Class: Chronic  . Status post lumbar laminectomy 03/16/2019  . Acute stress reaction  09/25/2018  . Primary osteoarthritis of right hip 04/27/2018  . Status post total hip replacement, 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  . Shortness of breath 01/02/2018  . Chalazion left upper eyelid 02/20/2017  . Vitamin D insufficiency 11/25/2013  . Healthcare maintenance 11/25/2013  . Spinal stenosis of lumbar region 07/02/2013  . Arthritis, degenerative 11/17/2012  . Baker's cyst of knee 06/05/2012  . Rectocele 04/28/2012  . Left knee DJD, degenerative meniscus tear 04/06/2012  . Numbness and tingling in hands 03/17/2012  . Periodontitis 09/23/2011  . Depression 09/23/2011  . Right knee meniscal tear 03/14/2011  . Lumbar radiculopathy 03/14/2011  . Stress incontinence of urine 10/18/2010  . NECK PAIN, CHRONIC 08/19/2008  . Normocytic anemia 05/12/2008  . SHOULDER PAIN, RIGHT 04/28/2008  . Hyperlipemia 06/18/2007  . Essential hypertension 05/20/2007   Past Medical History:  Diagnosis Date  . Anemia   . Arthritis    knees hands  . Bipolar disorder ([place])   . Cervicalgia   . Depression   . Environmental allergies    cause SOB, uses inhaler for  . Environmental and seasonal allergies    uses inhaler prn  . Full dentures   . GERD (gastroesophageal reflux  disease)    diet controlled - no meds  . Hyperlipidemia    diet controlled, no meds  . Hypertension   . Lumbar radiculopathy, chronic   . Right knee pain    posterior horn medial meniscal tear MRI 2013  . Spinal stenosis    getting epidural injections -last one 06/12/2015  . SVD (spontaneous vaginal delivery)    x 4    Family History  Problem Relation Age of Onset  . Hypertension Mother     Past Surgical History:  Procedure Laterality Date  . APPENDECTOMY    . Bladder tack  10/2006   cystocoele  . BREAST SURGERY Right    benign cyst  . COLONOSCOPY    . Hysterectomy other    . LUMBAR LAMINECTOMY N/A 03/16/2019   Procedure: CENTRAL LAMINECTOMIES L2-3, L3-4 AND L4-5;  Surgeon: Jessy Oto, MD;  Location: [facility];  Service:  Orthopedics;  Laterality: N/A;  . TOTAL HIP ARTHROPLASTY Right 04/27/2018   Procedure: RIGHT TOTAL HIP ARTHROPLASTY ANTERIOR APPROACH;  Surgeon: Leandrew Koyanagi, MD;  Location: [facility];  Service: Orthopedics;  Laterality: Right;  . TUBAL LIGATION     Social History   Occupational History  . Not on file  Tobacco Use  . Smoking status: Former Smoker    Packs/day: 0.10    Quit date: 11/26/1983    Years since quitting: 35.5  . Smokeless tobacco: Never Used  Substance and Sexual Activity  . Alcohol use: Yes    Alcohol/week: 0.0 standard drinks    Comment: rarely  . Drug use: Not Currently    Types: Marijuana    Comment: last used 2019  . Sexual activity: Not Currently    Birth control/protection: Surgical

## 2019-05-31 ENCOUNTER — Encounter: Payer: Medicare HMO | Admitting: Internal Medicine

## 2019-06-03 ENCOUNTER — Encounter: Payer: Self-pay | Admitting: *Deleted

## 2019-06-07 ENCOUNTER — Ambulatory Visit (INDEPENDENT_AMBULATORY_CARE_PROVIDER_SITE_OTHER): Payer: Medicare HMO | Admitting: Internal Medicine

## 2019-06-07 ENCOUNTER — Encounter: Payer: Self-pay | Admitting: Internal Medicine

## 2019-06-07 VITALS — BP 135/80 | HR 89 | Temp 98.2°F | Wt 199.6 lb

## 2019-06-07 DIAGNOSIS — J301 Allergic rhinitis due to pollen: Secondary | ICD-10-CM | POA: Insufficient documentation

## 2019-06-07 DIAGNOSIS — E785 Hyperlipidemia, unspecified: Secondary | ICD-10-CM | POA: Diagnosis not present

## 2019-06-07 DIAGNOSIS — Z1211 Encounter for screening for malignant neoplasm of colon: Secondary | ICD-10-CM | POA: Insufficient documentation

## 2019-06-07 DIAGNOSIS — D649 Anemia, unspecified: Secondary | ICD-10-CM

## 2019-06-07 DIAGNOSIS — Z1231 Encounter for screening mammogram for malignant neoplasm of breast: Secondary | ICD-10-CM | POA: Insufficient documentation

## 2019-06-07 MED ORDER — FLUTICASONE PROPIONATE 50 MCG/ACT NA SUSP: 1.0000 | Freq: Every day | NASAL | 2 refills | Status: DC

## 2019-06-07 MED ORDER — LORATADINE 10 MG PO TABS: 10.0000 mg | ORAL_TABLET | Freq: Every day | ORAL | 0 refills | Status: DC

## 2019-06-07 NOTE — Assessment & Plan Note (Signed)
Pt due for mammogram.    -placed referral

## 2019-06-07 NOTE — Assessment & Plan Note (Signed)
Currently using nasal saline as monotherapy.  Worsened by pollen, feels particularly bad this year.    -add flonase and short course of claritin -continue nasal irrigation can get more extensive equipment such as netty pot.  She will make sure to separate irrigation from flonase treatments by at least 8 hours

## 2019-06-07 NOTE — Assessment & Plan Note (Signed)
Lab Results  Component Value Date   CHOL 278 (H) 04/06/2012   HDL 43 04/06/2012   LDLCALC 194 (H) 04/06/2012   TRIG 204 (H) 04/06/2012   CHOLHDL 6.5 04/06/2012   Not currently on cholesterol medication.  She reports being on statin therapy in the past.  -Repeat lipid panel today

## 2019-06-07 NOTE — Patient Instructions (Signed)
Ms. Sylvia, you can continue the saline nasal spray.  Additionally you can purchase a nasal irrigation system.  I have started you on flonase and claritin for your allergic rhinitis.

## 2019-06-07 NOTE — Progress Notes (Signed)
CC: allergic rhinitis, HTN, Colon cancer screening, Breast Cancer Screening Mammogram, HLD, Normocytic Anemia  HPI:  [name] is a 69 y.o. female with PMH below.  Today we will address allergic rhinitis, HTN, Colon cancer screening, Breast Cancer Screening Mammogram, HLD, Normocytic Anemia  Please see A&P for status of the patient's chronic medical conditions  Past Medical History:  Diagnosis Date  . Anemia   . Arthritis    knees hands  . Bipolar disorder ([place])   . Cervicalgia   . Depression   . Environmental allergies    cause SOB, uses inhaler for  . Environmental and seasonal allergies    uses inhaler prn  . Full dentures   . GERD (gastroesophageal reflux disease)    diet controlled - no meds  . Hyperlipidemia    diet controlled, no meds  . Hypertension   . Lumbar radiculopathy, chronic   . Right knee pain    posterior horn medial meniscal tear MRI 2013  . Spinal stenosis    getting epidural injections -last one 06/12/2015  . SVD (spontaneous vaginal delivery)    x 4   Review of Systems:  ROS: Pulmonary: pt denies increased work of breathing, shortness of breath,  Cardiac: pt denies palpitations, chest pain,  Abdominal: pt denies abdominal pain, nausea, vomiting, or diarrhea   Physical Exam:  Vitals:   06/07/19 1337  BP: 135/80  Pulse: 89  Temp: 98.2 F (36.8 C)  TempSrc: Oral  SpO2: 100%  Weight: 199 lb 9.6 oz (90.5 kg)   Cardiac: JVD flat, normal rate and rhythm, clear s1 and s2, no murmurs, rubs or gallops, no LE edema Pulmonary: CTAB, not in distress Abdominal: non distended abdomen, soft and nontender Psych: Alert, conversant, in good spirits   Social History   Socioeconomic History  . Marital status: Divorced    Spouse name: Not on file  . Number of children: 4  . Years of education: Not on file  . Highest education level: Not on file  Occupational History  . Not on file  Tobacco Use  . Smoking status: Former Smoker   Packs/day: 0.10    Quit date: 11/26/1983    Years since quitting: 35.5  . Smokeless tobacco: Never Used  Substance and Sexual Activity  . Alcohol use: Yes    Alcohol/week: 0.0 standard drinks    Comment: rarely  . Drug use: Not Currently    Types: Marijuana    Comment: last used 2019  . Sexual activity: Not Currently    Birth control/protection: Surgical  Other Topics Concern  . Not on file  Social History Narrative   Single. 9 siblings. 1 brother with HIV, 1 sister with hypertension. 4 children, 1 daughter with HTN and depression, 1 son with HTN.   [phone]- shelter's number.    Former smoker, no alcohol or drug use.   Social Determinants of Health   Financial Resource Strain:   . Difficulty of Paying Living Expenses:   Food Insecurity:   . Worried About Charity fundraiser in the Last Year:   . Arboriculturist in the Last Year:   Transportation Needs:   . Film/video editor (Medical):   Marland Kitchen Lack of Transportation (Non-Medical):   Physical Activity:   . Days of Exercise per Week:   . Minutes of Exercise per Session:   Stress:   . Feeling of Stress :   Social Connections:   . Frequency of Communication with Friends and Family:   .  Frequency of Social Gatherings with Friends and Family:   . Attends Religious Services:   . Active Member of Clubs or Organizations:   . Attends Archivist Meetings:   Marland Kitchen Marital Status:   Intimate Partner Violence:   . Fear of Current or Ex-Partner:   . Emotionally Abused:   Marland Kitchen Physically Abused:   . Sexually Abused:     Family History  Problem Relation Age of Onset  . Hypertension Mother     Assessment & Plan:   See Encounters Tab for problem based charting.  Patient discussed with Dr. Philipp Ovens

## 2019-06-07 NOTE — Assessment & Plan Note (Signed)
Normocytic anemia history.  She has had worsening with orthopedic surgeries requiring transfusion, suspect iron deficiency.    -repeat iron studies and cbc she reports she will return tomorrow for labs -colonoscopy

## 2019-06-07 NOTE — Assessment & Plan Note (Signed)
Pt due for colonoscopy since January.    -refer pt for colonoscopy.

## 2019-06-11 NOTE — Progress Notes (Signed)
Internal Medicine Clinic Attending  Case discussed with Dr. Winfrey  at the time of the visit.  We reviewed the resident's history and exam and pertinent patient test results.  I agree with the assessment, diagnosis, and plan of care documented in the resident's note.  

## 2019-06-16 ENCOUNTER — Ambulatory Visit: Payer: Medicare HMO | Admitting: Licensed Clinical Social Worker

## 2019-06-16 ENCOUNTER — Telehealth: Payer: Self-pay | Admitting: Licensed Clinical Social Worker

## 2019-06-16 NOTE — Telephone Encounter (Signed)
Patient was called twice for her scheduled visit. Patient did not answer, and a vm was left for the patient.

## 2019-06-18 ENCOUNTER — Encounter: Payer: Medicare HMO | Admitting: Physical Medicine and Rehabilitation

## 2019-06-23 ENCOUNTER — Telehealth: Payer: Self-pay | Admitting: Physical Medicine and Rehabilitation

## 2019-06-23 NOTE — Telephone Encounter (Signed)
Pt is r/s for 08/06/19 at [facility]

## 2019-06-23 NOTE — Telephone Encounter (Signed)
Patient called asked for a call back to schedule an NCS. The number to contact patient is [phone]

## 2019-06-25 ENCOUNTER — Ambulatory Visit: Payer: Self-pay

## 2019-06-25 ENCOUNTER — Other Ambulatory Visit: Payer: Self-pay

## 2019-06-25 ENCOUNTER — Ambulatory Visit (INDEPENDENT_AMBULATORY_CARE_PROVIDER_SITE_OTHER): Payer: Medicare HMO | Admitting: Specialist

## 2019-06-25 ENCOUNTER — Encounter: Payer: Self-pay | Admitting: Specialist

## 2019-06-25 VITALS — BP 132/91 | HR 79 | Ht 65.0 in | Wt 200.0 lb

## 2019-06-25 DIAGNOSIS — M5441 Lumbago with sciatica, right side: Secondary | ICD-10-CM | POA: Diagnosis not present

## 2019-06-25 DIAGNOSIS — M5416 Radiculopathy, lumbar region: Secondary | ICD-10-CM | POA: Diagnosis not present

## 2019-06-25 DIAGNOSIS — Z9889 Other specified postprocedural states: Secondary | ICD-10-CM | POA: Diagnosis not present

## 2019-06-25 MED ORDER — HYDROCODONE-ACETAMINOPHEN 5-325 MG PO TABS: 1.0000 | ORAL_TABLET | Freq: Four times a day (QID) | ORAL | 0 refills | Status: DC | PRN

## 2019-06-25 MED ORDER — TIZANIDINE HCL 4 MG PO TABS
4.0000 mg | ORAL_TABLET | Freq: Four times a day (QID) | ORAL | 0 refills | Status: DC | PRN
Start: 2019-06-25 — End: 2019-10-14

## 2019-06-25 NOTE — Patient Instructions (Addendum)
Plan: Avoid bending, stooping and avoid lifting weights greater than 5-10 lbs. Avoid prolong standing and walking. Avoid frequent bending and stooping  No lifting greater than 15-10 lbs. May use ice or moist heat for pain. Weight loss is of benefit. Handicap license is approved. Avoid frequent bending and stooping  No lifting greater than 5-10 lbs. May use ice or moist heat for pain. Weight loss is of benefit. Best medication for lumbar disc disease is arthritis medications like motrin, celebrex and naprosyn. Exercise is important to improve your indurance and does allow people to function better inspite of back pain.  Hemp CBD capsules, amazon.com 5,000-7,000 mg per bottle, 60 capsules per bottle, take one capsule twice a day. Rx for pain med renewed one last time. EMG/NCV bilateral UE to assess for nerve compression vs radiculopathy/ Follow-Up Instructions: No follow-ups on file. Stop flexeril, start tizanidine( muscle relaxer) every 6 hours. No work for 4 days and return to a lighter schedule 1/2 days or single games no double Lexicographer.  Hot shower in AM and ice after games to back to decrease spasm and decrease muscle pain.

## 2019-06-25 NOTE — Progress Notes (Signed)
Office Visit Note   Patient: Olivia Ewing           Date of Birth: [birth year]           MRN: [id number] Visit Date: 06/25/2019              Requested by: Katherine Roan, MD [place],   [id number] PCP: Katherine Roan, MD   Assessment & Plan: Visit Diagnoses:  1. Acute midline low back pain with right-sided sciatica   2. Status post lumbar laminectomy   3. Lumbar radiculopathy     Plan: Plan: Avoid bending, stooping and avoid lifting weights greater than 5-10 lbs. Avoid prolong standing and walking. Avoid frequent bending and stooping  No lifting greater than 15-10 lbs. May use ice or moist heat for pain. Weight loss is of benefit. Handicap license is approved. Avoid frequent bending and stooping  No lifting greater than 5-10 lbs. May use ice or moist heat for pain. Weight loss is of benefit. Best medication for lumbar disc disease is arthritis medications like motrin, celebrex and naprosyn. Exercise is important to improve your indurance and does allow people to function better inspite of back pain.  Hemp CBD capsules, amazon.com 5,000-7,000 mg per bottle, 60 capsules per bottle, take one capsule twice a day. Rx for pain med renewed one last time. EMG/NCV bilateral UE to assess for nerve compression vs radiculopathy/ Follow-Up Instructions: No follow-ups on file. Stop flexeril, start tizanidine( muscle relaxer) every 6 hours. No work for 4 days and return to a lighter schedule 1/2 days or single games no double Lexicographer.  Hot shower in AM and ice after games to back to decrease spasm and decrease muscle pain.   Follow-Up Instructions: No follow-ups on file.   Orders:  Orders Placed This Encounter  Procedures  . XR Lumbar Spine 2-3 Views   No orders of the defined types were placed in this encounter.     Procedures: No procedures performed   Clinical Data: No additional findings.   Subjective: Chief Complaint  Patient presents  with  . Neck - Follow-up  . Lower Back - Follow-up    69 year old female with history of lumbar central laminectomy L2-3, L3-4 and L4-5. 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 She is having pins and needles sensation into both hands the radial 3 fingers. She is painful with standing and walking and she reports she was doing well till she restarted working her job. 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 Other times it is 6 hours for a single game. Pain she is experiencing is into the right anterolateral thigh. She reports the staff is new and many are having to be taught their jobs. She reports that the workers are not showing up.    Review of Systems   Objective: Vital Signs: BP (!) 132/91 (BP Location: Left Arm, Patient Position: Sitting)   Pulse 79   Ht 5\' 5"  (1.651 m)   Wt 200 lb (90.7 kg)   BMI 33.28 kg/m   Physical Exam Constitutional:      Appearance: She is well-developed.  HENT:     Head: Normocephalic and atraumatic.  Eyes:     Pupils: Pupils are equal, round, and reactive to light.  Pulmonary:     Effort: Pulmonary effort is normal.     Breath sounds: Normal breath sounds.  Abdominal:     General: Bowel sounds are normal.  Palpations: Abdomen is soft.  Musculoskeletal:     Cervical back: Normal range of motion and neck supple.     Lumbar back: Negative right straight leg raise test and negative left straight leg raise test.  Skin:    General: Skin is warm and dry.  Neurological:     Mental Status: She is alert and oriented to person, place, and time.  Psychiatric:        Behavior: Behavior normal.        Thought Content: Thought content normal.        Judgment: Judgment normal.     Back Exam   Tenderness  The patient is experiencing tenderness in the lumbar.  Range of Motion  Extension:  80 normal  Flexion:  60 abnormal  Lateral bend right: abnormal  Lateral bend left: abnormal  Rotation right: abnormal  Rotation  left: abnormal   Muscle Strength  Right Quadriceps:  5/5  Left Quadriceps:  5/5  Right Hamstrings:  5/5  Left Hamstrings:  5/5   Tests  Straight leg raise right: negative Straight leg raise left: negative  Reflexes  Patellar: 0/4 Achilles: 0/4 Babinski's sign: normal   Other  Toe walk: normal Heel walk: normal Sensation: normal Gait: antalgic  Erythema: no back redness Scars: present  Comments:  Right quaad strength is normal Bilateral paralumbar muscle spasm. L2-L5      Specialty Comments:  No specialty comments available.  Imaging: No results found.   PMFS History: Patient Active Problem List   Diagnosis Date Noted  . Spinal stenosis, lumbar region, with neurogenic claudication 03/16/2019    Priority: High    Class: Chronic  . Breast cancer screening by mammogram 06/07/2019  . Seasonal allergic rhinitis due to pollen 06/07/2019  . Colon cancer screening 06/07/2019  . Status post lumbar laminectomy 03/16/2019  . Acute stress reaction 09/25/2018  . Primary osteoarthritis of right hip 04/27/2018  . Status post total hip replacement, 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  . Shortness of breath 01/02/2018  . Chalazion left upper eyelid 02/20/2017  . Vitamin D insufficiency 11/25/2013  . Healthcare maintenance 11/25/2013  . Spinal stenosis of lumbar region 07/02/2013  . Arthritis, degenerative 11/17/2012  . Baker's cyst of knee 06/05/2012  . Rectocele 04/28/2012  . Left knee DJD, degenerative meniscus tear 04/06/2012  . Numbness and tingling in hands 03/17/2012  . Periodontitis 09/23/2011  . Depression 09/23/2011  . Right knee meniscal tear 03/14/2011  . Lumbar radiculopathy 03/14/2011  . Stress incontinence of urine 10/18/2010  . NECK PAIN, CHRONIC 08/19/2008  . Normocytic anemia 05/12/2008  . SHOULDER PAIN, RIGHT 04/28/2008  . Hyperlipemia 06/18/2007  . Essential hypertension 05/20/2007   Past Medical History:  Diagnosis Date  . Anemia   . Arthritis    knees  hands  . Bipolar disorder ([place])   . Cervicalgia   . Depression   . Environmental allergies    cause SOB, uses inhaler for  . Environmental and seasonal allergies    uses inhaler prn  . Full dentures   . GERD (gastroesophageal reflux disease)    diet controlled - no meds  . Hyperlipidemia    diet controlled, no meds  . Hypertension   . Lumbar radiculopathy, chronic   . Right knee pain    posterior horn medial meniscal tear MRI 2013  . Spinal stenosis    getting epidural injections -last one 06/12/2015  . SVD (spontaneous vaginal delivery)    x 4    Family History  Problem Relation  Age of Onset  . Hypertension Mother     Past Surgical History:  Procedure Laterality Date  . APPENDECTOMY    . Bladder tack  10/2006   cystocoele  . BREAST SURGERY Right    benign cyst  . COLONOSCOPY    . Hysterectomy other    . LUMBAR LAMINECTOMY N/A 03/16/2019   Procedure: CENTRAL LAMINECTOMIES L2-3, L3-4 AND L4-5;  Surgeon: Jessy Oto, MD;  Location: [facility];  Service: Orthopedics;  Laterality: N/A;  . TOTAL HIP ARTHROPLASTY Right 04/27/2018   Procedure: RIGHT TOTAL HIP ARTHROPLASTY ANTERIOR APPROACH;  Surgeon: Leandrew Koyanagi, MD;  Location: [facility];  Service: Orthopedics;  Laterality: Right;  . TUBAL LIGATION     Social History   Occupational History  . Not on file  Tobacco Use  . Smoking status: Former Smoker    Packs/day: 0.10    Quit date: 11/26/1983    Years since quitting: 35.6  . Smokeless tobacco: Never Used  Substance and Sexual Activity  . Alcohol use: Yes    Alcohol/week: 0.0 standard drinks    Comment: rarely  . Drug use: Not Currently    Types: Marijuana    Comment: last used 2019  . Sexual activity: Not Currently    Birth control/protection: Surgical

## 2019-06-28 ENCOUNTER — Other Ambulatory Visit: Payer: Self-pay | Admitting: Internal Medicine

## 2019-06-28 DIAGNOSIS — J301 Allergic rhinitis due to pollen: Secondary | ICD-10-CM

## 2019-07-01 ENCOUNTER — Other Ambulatory Visit: Payer: Self-pay | Admitting: Specialist

## 2019-07-13 ENCOUNTER — Telehealth: Payer: Self-pay | Admitting: Physical Medicine and Rehabilitation

## 2019-07-13 ENCOUNTER — Telehealth: Payer: Self-pay | Admitting: Orthopaedic Surgery

## 2019-07-13 ENCOUNTER — Other Ambulatory Visit: Payer: Self-pay | Admitting: Specialist

## 2019-07-13 MED ORDER — CYCLOBENZAPRINE HCL 10 MG PO TABS: 10.0000 mg | ORAL_TABLET | Freq: Three times a day (TID) | ORAL | 1 refills | Status: DC | PRN

## 2019-07-13 NOTE — Telephone Encounter (Signed)
Pt called asking for a call back from [facility]; pt believes she gave her an injection in her shoulder about a month ago.   [phone]

## 2019-07-13 NOTE — Telephone Encounter (Signed)
Patient called this morning requesting an RX refill on her muscle relaxer.  Patient would like the RX to be sent to [facility] on [address]  [phone].  Thank you.

## 2019-07-13 NOTE — Telephone Encounter (Signed)
Patient wants me to call her to discuss getting injections for her shoulder and neck pain. She is scheduled for NCV/ EMG on 6/25. She was scheduled for an earlier date but cancelled that appointment- cervical radic. No record of Dr. Romona Curls having done cervical injections in Epic. Please advise if I need to call her back to schedule something.----Please advsie

## 2019-07-13 NOTE — Telephone Encounter (Signed)
Request has been sent to Dr. Nitka 

## 2019-07-13 NOTE — Telephone Encounter (Signed)
Patient wants me to call her to discuss getting injections for her shoulder and neck pain. She is scheduled for NCV/ EMG on 6/25. She was scheduled for an earlier date but cancelled that appointment- cervical radic. No record of Dr. Romona Curls having done cervical injections in Epic. Please advise if I need to call her back to schedule something.

## 2019-07-13 NOTE — Telephone Encounter (Signed)
Returned patient's call and left message #1. 

## 2019-07-13 NOTE — Telephone Encounter (Signed)
Patient called.   Did not share why but she is requesting a call back from [employer].   Call back: [phone]

## 2019-07-16 ENCOUNTER — Ambulatory Visit: Payer: Medicare HMO | Admitting: Specialist

## 2019-07-16 ENCOUNTER — Encounter: Payer: Self-pay | Admitting: Orthopaedic Surgery

## 2019-07-16 ENCOUNTER — Telehealth: Payer: Self-pay | Admitting: Orthopaedic Surgery

## 2019-07-16 MED ORDER — TRAMADOL HCL 50 MG PO TABS: 50.0000 mg | ORAL_TABLET | Freq: Every day | ORAL | 0 refills | Status: DC | PRN

## 2019-07-16 NOTE — Telephone Encounter (Signed)
Pt called in stating she is still in pain and would like something to help relieve her.   [phone]

## 2019-07-16 NOTE — Telephone Encounter (Signed)
Yes that's fine for hilts to reinject

## 2019-07-16 NOTE — Telephone Encounter (Signed)
Actually she should come back in to see me first before we do another injection

## 2019-07-16 NOTE — Telephone Encounter (Signed)
Appt made to see you. States she Would like something for pain in the mean time. Pharm [facility] [address]

## 2019-07-16 NOTE — Telephone Encounter (Signed)
See other msg

## 2019-07-16 NOTE — Telephone Encounter (Signed)
04/27/2019 had injection with Dr Junius Roads- can she get another one?  Would like something for pain. Pharm [facility] [address].

## 2019-07-20 ENCOUNTER — Telehealth: Payer: Self-pay | Admitting: Orthopaedic Surgery

## 2019-07-20 ENCOUNTER — Ambulatory Visit: Payer: Medicare HMO | Admitting: Orthopaedic Surgery

## 2019-07-20 MED ORDER — ACETAMINOPHEN-CODEINE #3 300-30 MG PO TABS: 1.0000 | ORAL_TABLET | Freq: Every day | ORAL | 0 refills | Status: AC | PRN

## 2019-07-20 NOTE — Telephone Encounter (Signed)
Pt called wanting to let us know that the tramadol makes her sick and she would like to try something else.   [phone]

## 2019-07-21 ENCOUNTER — Ambulatory Visit: Payer: Medicare HMO | Admitting: Licensed Clinical Social Worker

## 2019-07-21 ENCOUNTER — Telehealth: Payer: Self-pay | Admitting: Licensed Clinical Social Worker

## 2019-07-21 NOTE — Telephone Encounter (Signed)
Patient called for her scheduled visit. Patient did not answer, and a vm was left for the patient.

## 2019-07-22 ENCOUNTER — Ambulatory Visit: Payer: Medicare HMO | Admitting: Family Medicine

## 2019-07-22 ENCOUNTER — Other Ambulatory Visit: Payer: Self-pay | Admitting: Internal Medicine

## 2019-07-22 DIAGNOSIS — J301 Allergic rhinitis due to pollen: Secondary | ICD-10-CM

## 2019-07-26 ENCOUNTER — Ambulatory Visit (INDEPENDENT_AMBULATORY_CARE_PROVIDER_SITE_OTHER): Payer: Medicare HMO | Admitting: Internal Medicine

## 2019-07-26 DIAGNOSIS — D649 Anemia, unspecified: Secondary | ICD-10-CM | POA: Diagnosis not present

## 2019-07-26 DIAGNOSIS — E785 Hyperlipidemia, unspecified: Secondary | ICD-10-CM

## 2019-07-27 ENCOUNTER — Ambulatory Visit (INDEPENDENT_AMBULATORY_CARE_PROVIDER_SITE_OTHER): Payer: Medicare HMO | Admitting: Orthopaedic Surgery

## 2019-07-27 ENCOUNTER — Encounter: Payer: Self-pay | Admitting: Internal Medicine

## 2019-07-27 DIAGNOSIS — M19011 Primary osteoarthritis, right shoulder: Secondary | ICD-10-CM

## 2019-07-27 DIAGNOSIS — M67921 Unspecified disorder of synovium and tendon, right upper arm: Secondary | ICD-10-CM | POA: Insufficient documentation

## 2019-07-27 DIAGNOSIS — M75111 Incomplete rotator cuff tear or rupture of right shoulder, not specified as traumatic: Secondary | ICD-10-CM | POA: Insufficient documentation

## 2019-07-27 HISTORY — DX: Incomplete rotator cuff tear or rupture of right shoulder, not specified as traumatic: M75.111

## 2019-07-27 LAB — IRON AND TIBC
Iron Saturation: 9 % — CL (ref 15–55)
Iron: 35 ug/dL (ref 27–139)
Total Iron Binding Capacity: 391 ug/dL (ref 250–450)
UIBC: 356 ug/dL (ref 118–369)

## 2019-07-27 LAB — LIPID PANEL
Chol/HDL Ratio: 6.2 ratio — ABNORMAL HIGH (ref 0.0–4.4)
Cholesterol, Total: 203 mg/dL — ABNORMAL HIGH (ref 100–199)
HDL: 33 mg/dL — ABNORMAL LOW (ref >39)
LDL Chol Calc (NIH): 119 mg/dL — ABNORMAL HIGH (ref 0–99)
Triglycerides: 287 mg/dL — ABNORMAL HIGH (ref 0–149)
VLDL Cholesterol Cal: 51 mg/dL — ABNORMAL HIGH (ref 5–40)

## 2019-07-27 LAB — CBC WITH DIFFERENTIAL/PLATELET
Basophils Absolute: 0.1 10*3/uL (ref 0.0–0.2)
Basos: 1 %
EOS (ABSOLUTE): 0.3 10*3/uL (ref 0.0–0.4)
Eos: 4 %
Hematocrit: 29.6 % — ABNORMAL LOW (ref 34.0–46.6)
Hemoglobin: 9.3 g/dL — ABNORMAL LOW (ref 11.1–15.9)
Immature Grans (Abs): 0.1 10*3/uL (ref 0.0–0.1)
Immature Granulocytes: 1 %
Lymphocytes Absolute: 1.7 10*3/uL (ref 0.7–3.1)
Lymphs: 22 %
MCH: 23.9 pg — ABNORMAL LOW (ref 26.6–33.0)
MCHC: 31.4 g/dL — ABNORMAL LOW (ref 31.5–35.7)
MCV: 76 fL — ABNORMAL LOW (ref 79–97)
Monocytes Absolute: 0.6 10*3/uL (ref 0.1–0.9)
Monocytes: 7 %
Neutrophils Absolute: 5.2 10*3/uL (ref 1.4–7.0)
Neutrophils: 65 %
Platelets: 404 10*3/uL (ref 150–450)
RBC: 3.89 x10E6/uL (ref 3.77–5.28)
RDW: 16.3 % — ABNORMAL HIGH (ref 11.7–15.4)
WBC: 7.8 10*3/uL (ref 3.4–10.8)

## 2019-07-27 LAB — FERRITIN: Ferritin: 20 ng/mL (ref 15–150)

## 2019-07-27 NOTE — Progress Notes (Signed)
Office Visit Note   Patient: Olivia Ewing           Date of Birth: 03-Jun-1950           MRN: [id number] Visit Date: 07/27/2019              Requested by: Katherine Roan, MD [place],  [place] [address] PCP: Katherine Roan, MD   Assessment & Plan: Visit Diagnoses:  1. Nontraumatic incomplete tear of right rotator cuff   2. Arthrosis of right acromioclavicular joint   3. Tendinopathy of right biceps tendon     Plan: Impression is right shoulder partial thickness supraspinatus and subscapularis tear with severe biceps tendinosis and AC joint arthrosis and impingement.  She has received only temporary relief from cortisone injection and physical therapy and conservative treatment.  At this time I have recommended arthroscopic debridement with distal clavicle excision and biceps tenotomy as well as debridement of the rotator cuff with possible repair.  Risk benefits rehab recovery were discussed in detail and the patient has elected to proceed with scheduling for the surgery sometime after July 4.  Questions encouraged and answered.  Follow-Up Instructions: No follow-ups on file.   Orders:  No orders of the defined types were placed in this encounter.  No orders of the defined types were placed in this encounter.     Procedures: No procedures performed   Clinical Data: No additional findings.   Subjective: Chief Complaint  Patient presents with  . Right Shoulder - Pain, Follow-up    Olivia Ewing is here for chronic right shoulder pain.  She has a history of a white ceiling tile that fell down to her right shoulder last year.  She has trouble sleeping at night due to the pain.  She has a nerve conduction study scheduled for next week to look for carpal tunnel syndrome.  She has had a prior cortisone injection with temporary relief but not complete.   Review of Systems  Constitutional: Negative.   HENT: Negative.   Eyes: Negative.   Respiratory: Negative.     Cardiovascular: Negative.   Endocrine: Negative.   Musculoskeletal: Negative.   Neurological: Negative.   Hematological: Negative.   Psychiatric/Behavioral: Negative.   All other systems reviewed and are negative.    Objective: Vital Signs: There were no vitals taken for this visit.  Physical Exam Vitals and nursing note reviewed.  Constitutional:      Appearance: She is well-developed.  Pulmonary:     Effort: Pulmonary effort is normal.  Skin:    General: Skin is warm.     Capillary Refill: Capillary refill takes less than 2 seconds.  Neurological:     Mental Status: She is alert and oriented to person, place, and time.  Psychiatric:        Behavior: Behavior normal.        Thought Content: Thought content normal.        Judgment: Judgment normal.     Ortho Exam Right shoulder shows pain with forward elevation.  Manual muscle testing reveals 4/5 strength due to pain and guarding.  Positive cross body adduction.  Positive Hawkins sign.  Biceps tendon is tender to palpation.  Positive Speed sign. Specialty Comments:  No specialty comments available.  Imaging: No results found.   PMFS History: Patient Active Problem List   Diagnosis Date Noted  . Nontraumatic incomplete tear of right rotator cuff 07/27/2019  . Tendinopathy of right biceps tendon 07/27/2019  .  Breast cancer screening by mammogram 06/07/2019  . Seasonal allergic rhinitis due to pollen 06/07/2019  . Colon cancer screening 06/07/2019  . Spinal stenosis, lumbar region, with neurogenic claudication 03/16/2019    Class: Chronic  . Status post lumbar laminectomy 03/16/2019  . Acute stress reaction 09/25/2018  . Primary osteoarthritis of right hip 04/27/2018  . Status post total hip replacement, 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  . Shortness of breath 01/02/2018  . Chalazion left upper eyelid 02/20/2017  . Vitamin D insufficiency 11/25/2013  . Healthcare maintenance 11/25/2013  . Spinal stenosis of lumbar region  07/02/2013  . Arthritis, degenerative 11/17/2012  . Arthrosis of right acromioclavicular joint 06/05/2012  . Rectocele 04/28/2012  . Left knee DJD, degenerative meniscus tear 04/06/2012  . Numbness and tingling in hands 03/17/2012  . Periodontitis 09/23/2011  . Depression 09/23/2011  . Right knee meniscal tear 03/14/2011  . Lumbar radiculopathy 03/14/2011  . Stress incontinence of urine 10/18/2010  . NECK PAIN, CHRONIC 08/19/2008  . Normocytic anemia 05/12/2008  . SHOULDER PAIN, RIGHT 04/28/2008  . Hyperlipemia 06/18/2007  . Essential hypertension 05/20/2007   Past Medical History:  Diagnosis Date  . Anemia   . Arthritis    knees hands  . Bipolar disorder ([place])   . Cervicalgia   . Depression   . Environmental allergies    cause SOB, uses inhaler for  . Environmental and seasonal allergies    uses inhaler prn  . Full dentures   . GERD (gastroesophageal reflux disease)    diet controlled - no meds  . Hyperlipidemia    diet controlled, no meds  . Hypertension   . Lumbar radiculopathy, chronic   . Right knee pain    posterior horn medial meniscal tear MRI 2013  . Spinal stenosis    getting epidural injections -last one 06/12/2015  . SVD (spontaneous vaginal delivery)    x 4    Family History  Problem Relation Age of Onset  . Hypertension Mother     Past Surgical History:  Procedure Laterality Date  . APPENDECTOMY    . Bladder tack  10/2006   cystocoele  . BREAST SURGERY Right    benign cyst  . COLONOSCOPY    . Hysterectomy other    . LUMBAR LAMINECTOMY N/A 03/16/2019   Procedure: CENTRAL LAMINECTOMIES L2-3, L3-4 AND L4-5;  Surgeon: Jessy Oto, MD;  Location: [facility];  Service: Orthopedics;  Laterality: N/A;  . TOTAL HIP ARTHROPLASTY Right 04/27/2018   Procedure: RIGHT TOTAL HIP ARTHROPLASTY ANTERIOR APPROACH;  Surgeon: Leandrew Koyanagi, MD;  Location: [facility];  Service: Orthopedics;  Laterality: Right;  . TUBAL LIGATION     Social History   Occupational History  .  Not on file  Tobacco Use  . Smoking status: Former Smoker    Packs/day: 0.10    Quit date: 11/26/1983    Years since quitting: 35.6  . Smokeless tobacco: Never Used  Vaping Use  . Vaping Use: Never used  Substance and Sexual Activity  . Alcohol use: Yes    Alcohol/week: 0.0 standard drinks    Comment: rarely  . Drug use: Not Currently    Types: Marijuana    Comment: last used 2019  . Sexual activity: Not Currently    Birth control/protection: Surgical

## 2019-07-29 ENCOUNTER — Telehealth: Payer: Self-pay | Admitting: *Deleted

## 2019-07-29 ENCOUNTER — Telehealth: Payer: Self-pay | Admitting: Internal Medicine

## 2019-07-29 DIAGNOSIS — E782 Mixed hyperlipidemia: Secondary | ICD-10-CM

## 2019-07-29 DIAGNOSIS — D508 Other iron deficiency anemias: Secondary | ICD-10-CM

## 2019-07-29 MED ORDER — FERROUS GLUCONATE 324 (38 FE) MG PO TABS: 324.0000 mg | ORAL_TABLET | Freq: Every day | ORAL | 0 refills | Status: DC

## 2019-07-29 MED ORDER — ROSUVASTATIN CALCIUM 20 MG PO TABS: 20.0000 mg | ORAL_TABLET | Freq: Every day | ORAL | 2 refills | Status: DC

## 2019-07-29 NOTE — Telephone Encounter (Signed)
Much appreciated, thank you

## 2019-07-29 NOTE — Telephone Encounter (Signed)
Pt rtc from dr Shan Levans, read his message to her, gave name of pharm, ask her to call for questions concerns

## 2019-07-29 NOTE — Telephone Encounter (Signed)
Patient has a firewall system on her phone called multiple times but could not get through as it sends you straight to voicemail.  I decided to leave a message outlining her lab results specifically the iron deficiency anemia secondary to surgery and also her  lipid panel results.  I outlined the need for iron replacement therapy at least temporarily and will order oral iron for now.  Additionally, I spoke about her cardiovascular risk and the benefits of statin therapy and possible side effects.  I will go ahead and order rosuvastatin 20 mg daily for the patient.  Patient was instructed to call with any concerns or questions and what to do if side effects do occur specifically stopping the medication calling me so I can address this issue.

## 2019-07-30 ENCOUNTER — Other Ambulatory Visit: Payer: Self-pay | Admitting: Internal Medicine

## 2019-07-30 DIAGNOSIS — E2839 Other primary ovarian failure: Secondary | ICD-10-CM

## 2019-07-30 DIAGNOSIS — Z1231 Encounter for screening mammogram for malignant neoplasm of breast: Secondary | ICD-10-CM

## 2019-08-04 ENCOUNTER — Other Ambulatory Visit: Payer: Self-pay

## 2019-08-06 ENCOUNTER — Other Ambulatory Visit: Payer: Self-pay | Admitting: Specialist

## 2019-08-06 ENCOUNTER — Ambulatory Visit (INDEPENDENT_AMBULATORY_CARE_PROVIDER_SITE_OTHER): Payer: Medicare HMO | Admitting: Physical Medicine and Rehabilitation

## 2019-08-06 ENCOUNTER — Other Ambulatory Visit: Payer: Self-pay

## 2019-08-06 ENCOUNTER — Encounter: Payer: Self-pay | Admitting: Physical Medicine and Rehabilitation

## 2019-08-06 DIAGNOSIS — R202 Paresthesia of skin: Secondary | ICD-10-CM | POA: Diagnosis not present

## 2019-08-06 NOTE — Telephone Encounter (Signed)
Patient called requesting pain medication for her hands. Patient states pain is also keeping her up at night. Patient ask for a call back when medication has been called in. Patient phone number is [phone].

## 2019-08-06 NOTE — Progress Notes (Signed)
Tingling in all fingers of both hands. Worse at night. Right hand dominant. Numeric Pain Rating Scale and Functional Assessment Average Pain 10   In the last MONTH (on 0-10 scale) has pain interfered with the following?  1. General activity like being  able to carry out your everyday physical activities such as walking, climbing stairs, carrying groceries, or moving a chair?  Rating(7)

## 2019-08-09 ENCOUNTER — Other Ambulatory Visit: Payer: Self-pay | Admitting: Specialist

## 2019-08-09 ENCOUNTER — Encounter: Payer: Self-pay | Admitting: Physical Medicine and Rehabilitation

## 2019-08-09 MED ORDER — TRAMADOL HCL 50 MG PO TABS: 50.0000 mg | ORAL_TABLET | Freq: Every day | ORAL | 0 refills | Status: DC | PRN

## 2019-08-09 NOTE — Progress Notes (Signed)
Olivia Ewing - 69 y.o. female MRN [id number]  Date of birth: 29-Oct-1950  Office Visit Note: Visit Date: 08/06/2019 PCP: [name], MD Referred by: Katherine Roan, MD  Subjective: Chief Complaint  Patient presents with  . Right Hand - Tingling  . Left Hand - Tingling   HPI:  Olivia Ewing is a 69 y.o. female who comes in today At the request of Dr. Basil Dess for electrodiagnostic study of both upper limbs. Patient is right-hand dominant reporting 10 out of 10 pain with numbness and tingling. She reports tingling in all of the fingers of both hands worse at night. She does not report any dermatomal or peripheral nerve pattern but somewhat global. She has not had prior electrodiagnostic studies. She does not carry a diagnosis of diabetes. She did have recent MRI of the cervical spine which was reviewed. She has had prior lumbar injections in our office and has undergone lumbar spine surgery since I have seen her and she is doing quite well. She is also followed by Dr. Eduard Roux for her shoulder and is tells me today she is expected to have shoulder surgery at some point. She denies any frank radicular symptoms down the arms. She does have some neck pain.  ROS Otherwise per HPI.  Assessment & Plan: Visit Diagnoses:  1. Paresthesia of skin     Plan: Impression: The above electrodiagnostic study is ABNORMAL and reveals evidence of:  1.  A severe left median nerve entrapment at the wrist (carpal tunnel syndrome) affecting sensory and motor components. The lesion is characterized by sensory and motor demyelination with evidence of significant axonal injury.   2. A moderate to severe right median nerve entrapment at the wrist (carpal tunnel syndrome) affecting sensory and motor components.   There is no significant electrodiagnostic evidence of any other focal nerve entrapment, brachial plexopathy or cervical radiculopathy.   Recommendations: 1.  Follow-up with referring  physician. 2.  Continue current management of symptoms. 3.  Suggest surgical evaluation.  Meds & Orders: No orders of the defined types were placed in this encounter.   Orders Placed This Encounter  Procedures  . NCV with EMG (electromyography)    Follow-up: Return for Basil Dess, MD.   Procedures: No procedures performed  EMG & NCV Findings: Evaluation of the left median motor nerve showed prolonged distal onset latency (6.9 ms), reduced amplitude (0.1 mV), and decreased conduction velocity (Elbow-Wrist, 45 m/s).  The right median motor nerve showed prolonged distal onset latency (8.1 ms) and decreased conduction velocity (Elbow-Wrist, 43 m/s).  The left median (across palm) sensory nerve showed no response (Wrist) and no response (Palm).  The right median (across palm) sensory nerve showed prolonged distal peak latency (Wrist, 8.3 ms), reduced amplitude (5.4 V), and prolonged distal peak latency (Palm, 5.9 ms).  All remaining nerves (as indicated in the following tables) were within normal limits.  Left vs. Right side comparison data for the median motor nerve indicates abnormal L-R latency difference (1.2 ms) and abnormal L-R amplitude difference (98.3 %).    All examined muscles (as indicated in the following table) showed no evidence of electrical instability.    Impression: The above electrodiagnostic study is ABNORMAL and reveals evidence of:  1.  A severe left median nerve entrapment at the wrist (carpal tunnel syndrome) affecting sensory and motor components. The lesion is characterized by sensory and motor demyelination with evidence of significant axonal injury.   2. A moderate to severe right  median nerve entrapment at the wrist (carpal tunnel syndrome) affecting sensory and motor components.   There is no significant electrodiagnostic evidence of any other focal nerve entrapment, brachial plexopathy or cervical radiculopathy.   Recommendations: 1.  Follow-up with referring  physician. 2.  Continue current management of symptoms. 3.  Suggest surgical evaluation.  ___________________________ Laurence Spates FAAPMR Board Certified, American Board of Physical Medicine and Rehabilitation    Nerve Conduction Studies Anti Sensory Summary Table   Stim Site NR Peak (ms) Norm Peak (ms) P-T Amp (V) Norm P-T Amp Site1 Site2 Delta-P (ms) Dist (cm) Vel (m/s) Norm Vel (m/s)  Left Median Acr Palm Anti Sensory (2nd Digit)  31.3C  Wrist *NR  <3.6  >10 Wrist Palm  0.0    Palm *NR  <2.0          Right Median Acr Palm Anti Sensory (2nd Digit)  32C  Wrist    *8.3 <3.6 *5.4 >10 Wrist Palm 2.4 0.0    Palm    *5.9 <2.0 2.4         Right Radial Anti Sensory (Base 1st Digit)  31.7C  Wrist    2.0 <3.1 25.4  Wrist Base 1st Digit 2.0 0.0    Right Ulnar Anti Sensory (5th Digit)  32.2C  Wrist    3.2 <3.7 24.4 >15.0 Wrist 5th Digit 3.2 14.0 44 >38   Motor Summary Table   Stim Site NR Onset (ms) Norm Onset (ms) O-P Amp (mV) Norm O-P Amp Site1 Site2 Delta-0 (ms) Dist (cm) Vel (m/s) Norm Vel (m/s)  Left Median Motor (Abd Poll Brev)  31.7C  Wrist    *6.9 <4.2 *0.1 >5 Elbow Wrist 4.9 22.0 *45 >50  Elbow    11.8  0.1         Right Median Motor (Abd Poll Brev)  31.7C    Martin-Gruber  Wrist    *8.1 <4.2 6.0 >5 Elbow Wrist 4.9 21.0 *43 >50  Elbow    13.0  6.0         Right Ulnar Motor (Abd Dig Min)  31.7C  Wrist    3.0 <4.2 7.2 >3 B Elbow Wrist 3.8 21.0 55 >53  B Elbow    6.8  5.9  A Elbow B Elbow 1.6 10.0 62 >53  A Elbow    8.4  6.2          EMG   Side Muscle Nerve Root Ins Act Fibs Psw Amp Dur Poly Recrt Int Fraser Din Comment  Right Abd Poll Brev Median C8-T1 Nml Nml Nml Nml Nml 0 Nml Nml   Right 1stDorInt Ulnar C8-T1 Nml Nml Nml Nml Nml 0 Nml Nml   Right PronatorTeres Median C6-7 Nml Nml Nml Nml Nml 0 Nml Nml   Right Biceps Musculocut C5-6 Nml Nml Nml Nml Nml 0 Nml Nml   Right Deltoid Axillary C5-6 Nml Nml Nml Nml Nml 0 Nml Nml     Nerve Conduction Studies Anti Sensory  Left/Right Comparison   Stim Site L Lat (ms) R Lat (ms) L-R Lat (ms) L Amp (V) R Amp (V) L-R Amp (%) Site1 Site2 L Vel (m/s) R Vel (m/s) L-R Vel (m/s)  Median Acr Palm Anti Sensory (2nd Digit)  31.3C  Wrist  *8.3   *5.4  Wrist Palm     Palm  *5.9   2.4        Radial Anti Sensory (Base 1st Digit)  31.7C  Wrist  2.0   25.4  Wrist Base  1st Digit     Ulnar Anti Sensory (5th Digit)  32.2C  Wrist  3.2   24.4  Wrist 5th Digit  44    Motor Left/Right Comparison   Stim Site L Lat (ms) R Lat (ms) L-R Lat (ms) L Amp (mV) R Amp (mV) L-R Amp (%) Site1 Site2 L Vel (m/s) R Vel (m/s) L-R Vel (m/s)  Median Motor (Abd Poll Brev)  31.7C  Wrist *6.9 *8.1 *1.2 *0.1 6.0 *98.3 Elbow Wrist *45 *43 2  Elbow 11.8 13.0 1.2 0.1 6.0 98.3       Ulnar Motor (Abd Dig Min)  31.7C  Wrist  3.0   7.2  B Elbow Wrist  55   B Elbow  6.8   5.9  A Elbow B Elbow  62   A Elbow  8.4   6.2           Waveforms:                 Clinical History: CLINICAL DATA:  Upper arm pain, stress fracture suspected  EXAM: MRI CERVICAL SPINE WITHOUT CONTRAST  TECHNIQUE: Multiplanar, multisequence MR imaging of the cervical spine was performed. No intravenous contrast was administered.  COMPARISON:  04/09/2007  FINDINGS: Alignment: 1-2 mm anterolisthesis of C7 on T1.  Vertebrae: No fracture, evidence of discitis, or bone lesion.  Cord: Normal signal and morphology.  Posterior Fossa, vertebral arteries, paraspinal tissues: Posterior fossa demonstrates no focal abnormality. Vertebral artery flow voids are maintained. Paraspinal soft tissues are unremarkable.  Disc levels:  Disc spaces: Degenerative disc disease with disc height loss at C5-6  C2-3: No significant disc bulge. No neural foraminal stenosis. No central canal stenosis.  C3-4: Small central disc protrusion. Mild bilateral facet arthropathy. Mild bilateral foraminal stenosis. No central canal stenosis.  C4-5: Broad-based disc bulge.  Mild bilateral foraminal stenosis. Mild bilateral facet arthropathy. No central canal stenosis.  C5-6: Broad-based disc osteophyte complex. Bilateral uncovertebral degenerative changes. Severe bilateral foraminal stenosis. Mild spinal stenosis.  C6-7: No significant disc bulge. No neural foraminal stenosis. No central canal stenosis.  C7-T1: Broad-based disc bulge. Severe right and moderate left facet arthropathy. Severe right foraminal stenosis. Moderate-severe left foraminal stenosis. Mild spinal stenosis.  IMPRESSION: 1. Diffuse cervical spine spondylosis as described above. 2.  No acute osseous injury of the cervical spine.   Electronically Signed   By: Kathreen Devoid   On: 03/04/2019 15:58     Objective:  VS:  HT:    WT:   BMI:     BP:   HR: bpm  TEMP: ( )  RESP:  Physical Exam Musculoskeletal:        General: No swelling, tenderness or deformity.     Comments: Inspection reveals atrophy of the left APB but not the right APB and no atrophy of the bilateral FDI or hand intrinsics. There is no swelling, color changes, allodynia or dystrophic changes. There is 5 out of 5 strength in the bilateral wrist extension, finger abduction and long finger flexion. There is decreased sensation in the median nerve distribution on the left. There is a negative Froment's test bilaterally.  There is a negative Hoffmann's test bilaterally.  Skin:    General: Skin is warm and dry.     Findings: No erythema or rash.  Neurological:     General: No focal deficit present.     Mental Status: She is alert and oriented to person, place, and time.     Motor: No weakness or  abnormal muscle tone.     Coordination: Coordination normal.  Psychiatric:        Mood and Affect: Mood normal.        Behavior: Behavior normal.      Imaging: No results found.

## 2019-08-09 NOTE — Procedures (Signed)
EMG & NCV Findings: Evaluation of the left median motor nerve showed prolonged distal onset latency (6.9 ms), reduced amplitude (0.1 mV), and decreased conduction velocity (Elbow-Wrist, 45 m/s).  The right median motor nerve showed prolonged distal onset latency (8.1 ms) and decreased conduction velocity (Elbow-Wrist, 43 m/s).  The left median (across palm) sensory nerve showed no response (Wrist) and no response (Palm).  The right median (across palm) sensory nerve showed prolonged distal peak latency (Wrist, 8.3 ms), reduced amplitude (5.4 V), and prolonged distal peak latency (Palm, 5.9 ms).  All remaining nerves (as indicated in the following tables) were within normal limits.  Left vs. Right side comparison data for the median motor nerve indicates abnormal L-R latency difference (1.2 ms) and abnormal L-R amplitude difference (98.3 %).    All examined muscles (as indicated in the following table) showed no evidence of electrical instability.    Impression: The above electrodiagnostic study is ABNORMAL and reveals evidence of:  1.  A severe left median nerve entrapment at the wrist (carpal tunnel syndrome) affecting sensory and motor components. The lesion is characterized by sensory and motor demyelination with evidence of significant axonal injury.   2. A moderate to severe right median nerve entrapment at the wrist (carpal tunnel syndrome) affecting sensory and motor components.   There is no significant electrodiagnostic evidence of any other focal nerve entrapment, brachial plexopathy or cervical radiculopathy.   Recommendations: 1.  Follow-up with referring physician. 2.  Continue current management of symptoms. 3.  Suggest surgical evaluation.  ___________________________ Laurence Spates FAAPMR Board Certified, American Board of Physical Medicine and Rehabilitation    Nerve Conduction Studies Anti Sensory Summary Table   Stim Site NR Peak (ms) Norm Peak (ms) P-T Amp (V) Norm P-T  Amp Site1 Site2 Delta-P (ms) Dist (cm) Vel (m/s) Norm Vel (m/s)  Left Median Acr Palm Anti Sensory (2nd Digit)  31.3C  Wrist *NR  <3.6  >10 Wrist Palm  0.0    Palm *NR  <2.0          Right Median Acr Palm Anti Sensory (2nd Digit)  32C  Wrist    *8.3 <3.6 *5.4 >10 Wrist Palm 2.4 0.0    Palm    *5.9 <2.0 2.4         Right Radial Anti Sensory (Base 1st Digit)  31.7C  Wrist    2.0 <3.1 25.4  Wrist Base 1st Digit 2.0 0.0    Right Ulnar Anti Sensory (5th Digit)  32.2C  Wrist    3.2 <3.7 24.4 >15.0 Wrist 5th Digit 3.2 14.0 44 >38   Motor Summary Table   Stim Site NR Onset (ms) Norm Onset (ms) O-P Amp (mV) Norm O-P Amp Site1 Site2 Delta-0 (ms) Dist (cm) Vel (m/s) Norm Vel (m/s)  Left Median Motor (Abd Poll Brev)  31.7C  Wrist    *6.9 <4.2 *0.1 >5 Elbow Wrist 4.9 22.0 *45 >50  Elbow    11.8  0.1         Right Median Motor (Abd Poll Brev)  31.7C    Martin-Gruber  Wrist    *8.1 <4.2 6.0 >5 Elbow Wrist 4.9 21.0 *43 >50  Elbow    13.0  6.0         Right Ulnar Motor (Abd Dig Min)  31.7C  Wrist    3.0 <4.2 7.2 >3 B Elbow Wrist 3.8 21.0 55 >53  B Elbow    6.8  5.9  A Elbow B Elbow 1.6 10.0 62 >  53  A Elbow    8.4  6.2          EMG   Side Muscle Nerve Root Ins Act Fibs Psw Amp Dur Poly Recrt Int Fraser Din Comment  Right Abd Poll Brev Median C8-T1 Nml Nml Nml Nml Nml 0 Nml Nml   Right 1stDorInt Ulnar C8-T1 Nml Nml Nml Nml Nml 0 Nml Nml   Right PronatorTeres Median C6-7 Nml Nml Nml Nml Nml 0 Nml Nml   Right Biceps Musculocut C5-6 Nml Nml Nml Nml Nml 0 Nml Nml   Right Deltoid Axillary C5-6 Nml Nml Nml Nml Nml 0 Nml Nml     Nerve Conduction Studies Anti Sensory Left/Right Comparison   Stim Site L Lat (ms) R Lat (ms) L-R Lat (ms) L Amp (V) R Amp (V) L-R Amp (%) Site1 Site2 L Vel (m/s) R Vel (m/s) L-R Vel (m/s)  Median Acr Palm Anti Sensory (2nd Digit)  31.3C  Wrist  *8.3   *5.4  Wrist Palm     Palm  *5.9   2.4        Radial Anti Sensory (Base 1st Digit)  31.7C  Wrist  2.0   25.4  Wrist Base  1st Digit     Ulnar Anti Sensory (5th Digit)  32.2C  Wrist  3.2   24.4  Wrist 5th Digit  44    Motor Left/Right Comparison   Stim Site L Lat (ms) R Lat (ms) L-R Lat (ms) L Amp (mV) R Amp (mV) L-R Amp (%) Site1 Site2 L Vel (m/s) R Vel (m/s) L-R Vel (m/s)  Median Motor (Abd Poll Brev)  31.7C  Wrist *6.9 *8.1 *1.2 *0.1 6.0 *98.3 Elbow Wrist *45 *43 2  Elbow 11.8 13.0 1.2 0.1 6.0 98.3       Ulnar Motor (Abd Dig Min)  31.7C  Wrist  3.0   7.2  B Elbow Wrist  55   B Elbow  6.8   5.9  A Elbow B Elbow  62   A Elbow  8.4   6.2           Waveforms:

## 2019-08-09 NOTE — Telephone Encounter (Signed)
Receiving meds from Dr. Erlinda Hong also, I wrote in my last note not to continue to prescribe beyond last visit. EMG/NCV with severe CTS.

## 2019-08-09 NOTE — Telephone Encounter (Signed)
Patient called again   Repeat message. Requesting pain medicine be sent to the [facility] on [address].   Call back: [phone]

## 2019-08-10 ENCOUNTER — Telehealth: Payer: Self-pay

## 2019-08-10 NOTE — Telephone Encounter (Signed)
Patient called in saying she cannot take tramdol says it makes her hallucinate and doesn't do her well. Wants to know if she can get small dose of  Hydrocodone.

## 2019-08-10 NOTE — Telephone Encounter (Signed)
I tried to call but she was not accepting calls at this time

## 2019-08-11 ENCOUNTER — Ambulatory Visit (INDEPENDENT_AMBULATORY_CARE_PROVIDER_SITE_OTHER): Payer: Medicare HMO | Admitting: Licensed Clinical Social Worker

## 2019-08-11 ENCOUNTER — Other Ambulatory Visit: Payer: Self-pay

## 2019-08-11 ENCOUNTER — Other Ambulatory Visit: Payer: Self-pay | Admitting: Internal Medicine

## 2019-08-11 DIAGNOSIS — F32A Depression, unspecified: Secondary | ICD-10-CM

## 2019-08-11 DIAGNOSIS — F419 Anxiety disorder, unspecified: Secondary | ICD-10-CM

## 2019-08-11 DIAGNOSIS — F430 Acute stress reaction: Secondary | ICD-10-CM

## 2019-08-11 DIAGNOSIS — F329 Major depressive disorder, single episode, unspecified: Secondary | ICD-10-CM

## 2019-08-11 NOTE — Telephone Encounter (Signed)
tried calling again, states that the call could not be completed at this time

## 2019-08-12 ENCOUNTER — Telehealth: Payer: Self-pay | Admitting: Orthopaedic Surgery

## 2019-08-12 MED ORDER — ACETAMINOPHEN-CODEINE #3 300-30 MG PO TABS: 1.0000 | ORAL_TABLET | Freq: Every day | ORAL | 0 refills | Status: DC | PRN

## 2019-08-12 NOTE — Telephone Encounter (Signed)
Patient called requesting new medication. Patient states she is allergic to tramadol and haven't picked it up. Please pharmacy [facility] [address]. Patient phone number is [phone].

## 2019-08-12 NOTE — Telephone Encounter (Signed)
Tried to call for 3rd time, says the call can not be completed at this time---if she calls back we can address, Dr. Louanne Skye states that he will not give her any pain meds at this time.

## 2019-08-13 ENCOUNTER — Other Ambulatory Visit: Payer: Self-pay | Admitting: Family

## 2019-08-13 NOTE — BH Specialist Note (Signed)
Integrated Behavioral Health Follow Up Visit  MRN: [id number] Name: Olivia Ewing  Session Start time: 9:25  Session End time: 10:00 Total time: 35 minutes  Type of Service: [address]- Individual Interpretor:No.   SUBJECTIVE: Olivia Ewing is a 69 y.o. female accompanied by whom attended the session individually. Patient was referred by Dr. Shan Levans for stress. Patient reports the following symptoms/concerns: panic, acute stress that has transitioned to anxiety, history of trauma, mood instability, grief, anger, sleep challenges, and financial issues.Interpersonal challenges and depression.  Duration of problem: since July 2010; Severity of problem: moderate  OBJECTIVE: Mood: Depressed and Affect: Depressed and Tearful Risk of harm to self or others: No plan to harm self or others  LIFE CONTEXT: Family and Social: Patient reported that her daughter has rejected her, and has brought up past issues.  Self-Care: Continue to work on building healthy supports.  Life Changes: Pt has not spoken to her daughter in one month.   GOALS ADDRESSED: Patient will: 1.  Reduce symptoms of: agitation, anxiety, depression, mood instability and stress  2.  Increase knowledge and/or ability of: coping skills, healthy habits, self-management skills and stress reduction  3.  Demonstrate ability to: Increase healthy adjustment to current life circumstances, Increase adequate support systems for patient/family and improve emotional regulation and communicaiton skills.  INTERVENTIONS: Interventions utilized:  Optician, dispensing, Brief CBT and Supportive Counseling Standardized Assessments completed: assessed for SI, HI, and self-har.  ASSESSMENT: Patient currently experiencing moderate levels of anxiety and depression. Patient processed a recent situation with her daughter that has increased her anxiety and sadness due to feeling she has not been forgiven for her  past. Patient had difficulty regulating her emotions, and can work on focusing on the present. Patient was tearful throughout the session, and processed how she wants a connection with her daughter. Patient processed how she has made significant changes in her lifestyle over the years.   Patient does continue to stress about her two son's that are currently incarcerated.    Patient may benefit from counseling and continue with medication.  PLAN: 1. Follow up with behavioral health clinician on : two weeks.   Dessie Coma, [facility], [place]

## 2019-08-17 ENCOUNTER — Encounter (HOSPITAL_BASED_OUTPATIENT_CLINIC_OR_DEPARTMENT_OTHER): Payer: Self-pay | Admitting: Orthopaedic Surgery

## 2019-08-17 ENCOUNTER — Other Ambulatory Visit: Payer: Self-pay

## 2019-08-21 ENCOUNTER — Other Ambulatory Visit: Payer: Self-pay | Admitting: Internal Medicine

## 2019-08-21 DIAGNOSIS — J301 Allergic rhinitis due to pollen: Secondary | ICD-10-CM

## 2019-08-23 ENCOUNTER — Other Ambulatory Visit (HOSPITAL_COMMUNITY): Admission: RE | Admit: 2019-08-23 | Payer: Medicare HMO | Source: Ambulatory Visit

## 2019-08-24 ENCOUNTER — Encounter (HOSPITAL_BASED_OUTPATIENT_CLINIC_OR_DEPARTMENT_OTHER): Payer: Self-pay | Admitting: Orthopaedic Surgery

## 2019-08-24 ENCOUNTER — Other Ambulatory Visit (HOSPITAL_COMMUNITY)
Admission: RE | Admit: 2019-08-24 | Discharge: 2019-08-24 | Disposition: A | Discharge status: Home / Self Care | Payer: Medicare HMO | Source: Ambulatory Visit | Attending: Orthopaedic Surgery | Admitting: Orthopaedic Surgery

## 2019-08-24 ENCOUNTER — Encounter (HOSPITAL_BASED_OUTPATIENT_CLINIC_OR_DEPARTMENT_OTHER)
Admission: RE | Admit: 2019-08-24 | Discharge: 2019-08-24 | Disposition: A | Discharge status: Home / Self Care | Payer: Medicare HMO | Source: Ambulatory Visit | Attending: Orthopaedic Surgery | Admitting: Orthopaedic Surgery

## 2019-08-24 DIAGNOSIS — M75111 Incomplete rotator cuff tear or rupture of right shoulder, not specified as traumatic: Secondary | ICD-10-CM | POA: Diagnosis present

## 2019-08-24 DIAGNOSIS — Z01812 Encounter for preprocedural laboratory examination: Secondary | ICD-10-CM | POA: Insufficient documentation

## 2019-08-24 DIAGNOSIS — M170 Bilateral primary osteoarthritis of knee: Secondary | ICD-10-CM | POA: Diagnosis not present

## 2019-08-24 DIAGNOSIS — Z79899 Other long term (current) drug therapy: Secondary | ICD-10-CM | POA: Diagnosis not present

## 2019-08-24 DIAGNOSIS — F319 Bipolar disorder, unspecified: Secondary | ICD-10-CM | POA: Diagnosis not present

## 2019-08-24 DIAGNOSIS — M7551 Bursitis of right shoulder: Secondary | ICD-10-CM | POA: Diagnosis not present

## 2019-08-24 DIAGNOSIS — M19041 Primary osteoarthritis, right hand: Secondary | ICD-10-CM | POA: Diagnosis not present

## 2019-08-24 DIAGNOSIS — Z20822 Contact with and (suspected) exposure to covid-19: Secondary | ICD-10-CM | POA: Diagnosis not present

## 2019-08-24 DIAGNOSIS — Z8249 Family history of ischemic heart disease and other diseases of the circulatory system: Secondary | ICD-10-CM | POA: Diagnosis not present

## 2019-08-24 DIAGNOSIS — M65811 Other synovitis and tenosynovitis, right shoulder: Secondary | ICD-10-CM | POA: Diagnosis not present

## 2019-08-24 DIAGNOSIS — E785 Hyperlipidemia, unspecified: Secondary | ICD-10-CM | POA: Diagnosis not present

## 2019-08-24 DIAGNOSIS — Z791 Long term (current) use of non-steroidal anti-inflammatories (NSAID): Secondary | ICD-10-CM | POA: Diagnosis not present

## 2019-08-24 DIAGNOSIS — Z885 Allergy status to narcotic agent status: Secondary | ICD-10-CM | POA: Diagnosis not present

## 2019-08-24 DIAGNOSIS — M94211 Chondromalacia, right shoulder: Secondary | ICD-10-CM | POA: Diagnosis not present

## 2019-08-24 DIAGNOSIS — M19011 Primary osteoarthritis, right shoulder: Secondary | ICD-10-CM | POA: Diagnosis not present

## 2019-08-24 DIAGNOSIS — Z87891 Personal history of nicotine dependence: Secondary | ICD-10-CM | POA: Diagnosis not present

## 2019-08-24 DIAGNOSIS — M19042 Primary osteoarthritis, left hand: Secondary | ICD-10-CM | POA: Diagnosis not present

## 2019-08-24 DIAGNOSIS — I10 Essential (primary) hypertension: Secondary | ICD-10-CM | POA: Diagnosis not present

## 2019-08-24 DIAGNOSIS — M25811 Other specified joint disorders, right shoulder: Secondary | ICD-10-CM | POA: Diagnosis not present

## 2019-08-24 LAB — BASIC METABOLIC PANEL
Anion gap: 10 (ref 5–15)
BUN: 8 mg/dL (ref 8–23)
CO2: 25 mmol/L (ref 22–32)
Calcium: 9 mg/dL (ref 8.9–10.3)
Chloride: 105 mmol/L (ref 98–111)
Creatinine, Ser: 0.85 mg/dL (ref 0.44–1.00)
GFR calc Af Amer: >60 mL/min (ref >60)
GFR calc non Af Amer: >60 mL/min (ref >60)
Glucose, Bld: 110 mg/dL — ABNORMAL HIGH (ref 70–99)
Potassium: 3.8 mmol/L (ref 3.5–5.1)
Sodium: 140 mmol/L (ref 135–145)

## 2019-08-24 LAB — CBC
HCT: 31.3 % — ABNORMAL LOW (ref 36.0–46.0)
Hemoglobin: 9.1 g/dL — ABNORMAL LOW (ref 12.0–15.0)
MCH: 23.5 pg — ABNORMAL LOW (ref 26.0–34.0)
MCHC: 29.1 g/dL — ABNORMAL LOW (ref 30.0–36.0)
MCV: 80.7 fL (ref 80.0–100.0)
Platelets: 332 10*3/uL (ref 150–400)
RBC: 3.88 MIL/uL (ref 3.87–5.11)
RDW: 16.9 % — ABNORMAL HIGH (ref 11.5–15.5)
WBC: 7.7 10*3/uL (ref 4.0–10.5)
nRBC: 0 % (ref 0.0–0.2)

## 2019-08-24 LAB — SARS CORONAVIRUS 2 (TAT 6-24 HRS): SARS Coronavirus 2: NEGATIVE

## 2019-08-24 NOTE — Progress Notes (Signed)

## 2019-08-25 ENCOUNTER — Ambulatory Visit (HOSPITAL_BASED_OUTPATIENT_CLINIC_OR_DEPARTMENT_OTHER): Payer: Medicare HMO | Admitting: Certified Registered Nurse Anesthetist

## 2019-08-25 ENCOUNTER — Encounter (HOSPITAL_BASED_OUTPATIENT_CLINIC_OR_DEPARTMENT_OTHER): Payer: Self-pay | Admitting: Orthopaedic Surgery

## 2019-08-25 ENCOUNTER — Encounter (HOSPITAL_BASED_OUTPATIENT_CLINIC_OR_DEPARTMENT_OTHER)
Admission: RE | Disposition: A | Discharge status: Home / Self Care | Payer: Self-pay | Source: Home / Self Care | Attending: Orthopaedic Surgery

## 2019-08-25 ENCOUNTER — Ambulatory Visit (HOSPITAL_BASED_OUTPATIENT_CLINIC_OR_DEPARTMENT_OTHER)
Admission: RE | Admit: 2019-08-25 | Discharge: 2019-08-25 | Disposition: A | Discharge status: Home / Self Care | Payer: Medicare HMO | Attending: Orthopaedic Surgery | Admitting: Orthopaedic Surgery

## 2019-08-25 ENCOUNTER — Encounter: Payer: Self-pay | Admitting: Orthopaedic Surgery

## 2019-08-25 DIAGNOSIS — M65811 Other synovitis and tenosynovitis, right shoulder: Secondary | ICD-10-CM | POA: Diagnosis not present

## 2019-08-25 DIAGNOSIS — M67921 Unspecified disorder of synovium and tendon, right upper arm: Secondary | ICD-10-CM | POA: Diagnosis not present

## 2019-08-25 DIAGNOSIS — Z791 Long term (current) use of non-steroidal anti-inflammatories (NSAID): Secondary | ICD-10-CM | POA: Insufficient documentation

## 2019-08-25 DIAGNOSIS — M25811 Other specified joint disorders, right shoulder: Secondary | ICD-10-CM | POA: Insufficient documentation

## 2019-08-25 DIAGNOSIS — M19041 Primary osteoarthritis, right hand: Secondary | ICD-10-CM | POA: Insufficient documentation

## 2019-08-25 DIAGNOSIS — M75111 Incomplete rotator cuff tear or rupture of right shoulder, not specified as traumatic: Secondary | ICD-10-CM | POA: Diagnosis not present

## 2019-08-25 DIAGNOSIS — M170 Bilateral primary osteoarthritis of knee: Secondary | ICD-10-CM | POA: Insufficient documentation

## 2019-08-25 DIAGNOSIS — E785 Hyperlipidemia, unspecified: Secondary | ICD-10-CM | POA: Insufficient documentation

## 2019-08-25 DIAGNOSIS — M7551 Bursitis of right shoulder: Secondary | ICD-10-CM | POA: Insufficient documentation

## 2019-08-25 DIAGNOSIS — M19042 Primary osteoarthritis, left hand: Secondary | ICD-10-CM | POA: Insufficient documentation

## 2019-08-25 DIAGNOSIS — Z8249 Family history of ischemic heart disease and other diseases of the circulatory system: Secondary | ICD-10-CM | POA: Insufficient documentation

## 2019-08-25 DIAGNOSIS — F319 Bipolar disorder, unspecified: Secondary | ICD-10-CM | POA: Insufficient documentation

## 2019-08-25 DIAGNOSIS — M94211 Chondromalacia, right shoulder: Secondary | ICD-10-CM | POA: Insufficient documentation

## 2019-08-25 DIAGNOSIS — M19011 Primary osteoarthritis, right shoulder: Secondary | ICD-10-CM | POA: Insufficient documentation

## 2019-08-25 DIAGNOSIS — I10 Essential (primary) hypertension: Secondary | ICD-10-CM | POA: Insufficient documentation

## 2019-08-25 DIAGNOSIS — Z885 Allergy status to narcotic agent status: Secondary | ICD-10-CM | POA: Insufficient documentation

## 2019-08-25 DIAGNOSIS — Z87891 Personal history of nicotine dependence: Secondary | ICD-10-CM | POA: Insufficient documentation

## 2019-08-25 DIAGNOSIS — Z79899 Other long term (current) drug therapy: Secondary | ICD-10-CM | POA: Insufficient documentation

## 2019-08-25 SURGERY — SHOULDER ARTHROSCOPY WITH SUBACROMIAL DECOMPRESSION AND DISTAL CLAVICLE EXCISION
Anesthesia: Regional | Site: Shoulder | Laterality: Right

## 2019-08-25 MED ORDER — PROMETHAZINE HCL 25 MG/ML IJ SOLN
12.5000 mg | Freq: Once | INTRAMUSCULAR | Status: AC
  Administered 2019-08-25: 6.25 mg via INTRAVENOUS

## 2019-08-25 MED ORDER — EPHEDRINE SULFATE 50 MG/ML IJ SOLN
INTRAMUSCULAR | Status: DC | PRN
  Administered 2019-08-25 (×3): 15 mg via INTRAVENOUS
  Administered 2019-08-25: 5 mg via INTRAVENOUS

## 2019-08-25 MED ORDER — PROMETHAZINE HCL 25 MG/ML IJ SOLN
INTRAMUSCULAR | Status: AC
  Filled 2019-08-25: qty 1

## 2019-08-25 MED ORDER — FENTANYL CITRATE (PF) 100 MCG/2ML IJ SOLN: 25.0000 ug | INTRAMUSCULAR | Status: DC | PRN

## 2019-08-25 MED ORDER — SODIUM CHLORIDE 0.9 % IR SOLN
Status: DC | PRN
  Administered 2019-08-25: 6000 mL

## 2019-08-25 MED ORDER — GLYCOPYRROLATE 0.2 MG/ML IJ SOLN
INTRAMUSCULAR | Status: AC
  Filled 2019-08-25: qty 3

## 2019-08-25 MED ORDER — LIDOCAINE-EPINEPHRINE 1 %-1:100000 IJ SOLN
INTRAMUSCULAR | Status: AC
  Filled 2019-08-25: qty 1

## 2019-08-25 MED ORDER — SODIUM CHLORIDE (PF) 0.9 % IJ SOLN
INTRAMUSCULAR | Status: AC
  Filled 2019-08-25: qty 10

## 2019-08-25 MED ORDER — ARTIFICIAL TEARS OPHTHALMIC OINT
TOPICAL_OINTMENT | OPHTHALMIC | Status: DC | PRN
  Administered 2019-08-25: 1 via OPHTHALMIC

## 2019-08-25 MED ORDER — PHENYLEPHRINE HCL (PRESSORS) 10 MG/ML IV SOLN
INTRAVENOUS | Status: DC | PRN
  Administered 2019-08-25: 80 ug via INTRAVENOUS
  Administered 2019-08-25: 120 ug via INTRAVENOUS
  Administered 2019-08-25 (×2): 80 ug via INTRAVENOUS
  Administered 2019-08-25: 120 ug via INTRAVENOUS
  Administered 2019-08-25: 80 ug via INTRAVENOUS

## 2019-08-25 MED ORDER — ONDANSETRON HCL 4 MG/2ML IJ SOLN
INTRAMUSCULAR | Status: DC | PRN
  Administered 2019-08-25: 4 mg via INTRAVENOUS

## 2019-08-25 MED ORDER — MIDAZOLAM HCL 2 MG/2ML IJ SOLN
INTRAMUSCULAR | Status: AC
  Filled 2019-08-25: qty 2

## 2019-08-25 MED ORDER — MIDAZOLAM HCL 2 MG/2ML IJ SOLN: 1.0000 mg | Freq: Once | INTRAMUSCULAR | Status: DC

## 2019-08-25 MED ORDER — FENTANYL CITRATE (PF) 100 MCG/2ML IJ SOLN
50.0000 ug | Freq: Once | INTRAMUSCULAR | Status: AC
  Administered 2019-08-25: 50 ug via INTRAVENOUS

## 2019-08-25 MED ORDER — CEFAZOLIN SODIUM-DEXTROSE 2-4 GM/100ML-% IV SOLN
2.0000 g | INTRAVENOUS | Status: AC
  Administered 2019-08-25: 2 g via INTRAVENOUS

## 2019-08-25 MED ORDER — LIDOCAINE HCL (CARDIAC) PF 100 MG/5ML IV SOSY
PREFILLED_SYRINGE | INTRAVENOUS | Status: DC | PRN
  Administered 2019-08-25: 40 mg via INTRATRACHEAL

## 2019-08-25 MED ORDER — DEXAMETHASONE SODIUM PHOSPHATE 10 MG/ML IJ SOLN
INTRAMUSCULAR | Status: DC | PRN
Start: 2019-08-25 — End: 2019-08-25
  Administered 2019-08-25: 5 mg via INTRAVENOUS

## 2019-08-25 MED ORDER — ONDANSETRON HCL 4 MG PO TABS: 4.0000 mg | ORAL_TABLET | Freq: Three times a day (TID) | ORAL | 0 refills | Status: DC | PRN

## 2019-08-25 MED ORDER — BUPIVACAINE HCL (PF) 0.25 % IJ SOLN
INTRAMUSCULAR | Status: AC
  Filled 2019-08-25: qty 30

## 2019-08-25 MED ORDER — MIDAZOLAM HCL 2 MG/2ML IJ SOLN
INTRAMUSCULAR | Status: DC | PRN
  Administered 2019-08-25 (×2): 1 mg via INTRAVENOUS

## 2019-08-25 MED ORDER — LACTATED RINGERS IV SOLN: INTRAVENOUS | Status: DC

## 2019-08-25 MED ORDER — PROPOFOL 10 MG/ML IV BOLUS
INTRAVENOUS | Status: AC
  Filled 2019-08-25: qty 20

## 2019-08-25 MED ORDER — MIDAZOLAM HCL 2 MG/2ML IJ SOLN
1.0000 mg | Freq: Once | INTRAMUSCULAR | Status: AC
  Administered 2019-08-25: 1 mg via INTRAVENOUS

## 2019-08-25 MED ORDER — LIDOCAINE 2% (20 MG/ML) 5 ML SYRINGE
INTRAMUSCULAR | Status: AC
  Filled 2019-08-25: qty 5

## 2019-08-25 MED ORDER — SUGAMMADEX SODIUM 200 MG/2ML IV SOLN
INTRAVENOUS | Status: DC | PRN
  Administered 2019-08-25: 175 mg via INTRAVENOUS

## 2019-08-25 MED ORDER — PHENYLEPHRINE 40 MCG/ML (10ML) SYRINGE FOR IV PUSH (FOR BLOOD PRESSURE SUPPORT)
PREFILLED_SYRINGE | INTRAVENOUS | Status: AC
  Filled 2019-08-25: qty 10

## 2019-08-25 MED ORDER — DEXAMETHASONE SODIUM PHOSPHATE 10 MG/ML IJ SOLN
INTRAMUSCULAR | Status: AC
  Filled 2019-08-25: qty 1

## 2019-08-25 MED ORDER — CEFAZOLIN SODIUM-DEXTROSE 2-4 GM/100ML-% IV SOLN
INTRAVENOUS | Status: AC
  Filled 2019-08-25: qty 100

## 2019-08-25 MED ORDER — ROCURONIUM BROMIDE 10 MG/ML (PF) SYRINGE
PREFILLED_SYRINGE | INTRAVENOUS | Status: AC
  Filled 2019-08-25: qty 10

## 2019-08-25 MED ORDER — ONDANSETRON HCL 4 MG/2ML IJ SOLN
4.0000 mg | Freq: Once | INTRAMUSCULAR | Status: AC | PRN
  Administered 2019-08-25: 4 mg via INTRAVENOUS

## 2019-08-25 MED ORDER — GLYCOPYRROLATE 0.2 MG/ML IJ SOLN
INTRAMUSCULAR | Status: DC | PRN
  Administered 2019-08-25: .2 mg via INTRAVENOUS

## 2019-08-25 MED ORDER — EPINEPHRINE PF 1 MG/ML IJ SOLN
INTRAMUSCULAR | Status: AC
  Filled 2019-08-25: qty 1

## 2019-08-25 MED ORDER — ROCURONIUM BROMIDE 100 MG/10ML IV SOLN
INTRAVENOUS | Status: DC | PRN
Start: 2019-08-25 — End: 2019-08-25
  Administered 2019-08-25: 50 mg via INTRAVENOUS

## 2019-08-25 MED ORDER — ONDANSETRON HCL 4 MG/2ML IJ SOLN
INTRAMUSCULAR | Status: AC
  Filled 2019-08-25: qty 2

## 2019-08-25 MED ORDER — OXYCODONE-ACETAMINOPHEN 5-325 MG PO TABS: 1.0000 | ORAL_TABLET | Freq: Two times a day (BID) | ORAL | 0 refills | Status: DC | PRN

## 2019-08-25 MED ORDER — FENTANYL CITRATE (PF) 100 MCG/2ML IJ SOLN
INTRAMUSCULAR | Status: AC
  Filled 2019-08-25: qty 2

## 2019-08-25 MED ORDER — FENTANYL CITRATE (PF) 100 MCG/2ML IJ SOLN
INTRAMUSCULAR | Status: DC | PRN
  Administered 2019-08-25 (×2): 50 ug via INTRAVENOUS

## 2019-08-25 MED ORDER — PROPOFOL 10 MG/ML IV BOLUS
INTRAVENOUS | Status: DC | PRN
Start: 2019-08-25 — End: 2019-08-25
  Administered 2019-08-25: 50 mg via INTRAVENOUS
  Administered 2019-08-25: 150 mg via INTRAVENOUS

## 2019-08-25 MED ORDER — EPHEDRINE 5 MG/ML INJ
INTRAVENOUS | Status: AC
  Filled 2019-08-25: qty 10

## 2019-08-25 SURGICAL SUPPLY — 78 items
ADH SKN CLS APL DERMABOND .7 (GAUZE/BANDAGES/DRESSINGS)
APL SKNCLS STERI-STRIP NONHPOA (GAUZE/BANDAGES/DRESSINGS)
BENZOIN TINCTURE PRP APPL 2/3 (GAUZE/BANDAGES/DRESSINGS) IMPLANT
BLADE EXCALIBUR 4.0MM X 13CM (MISCELLANEOUS)
BLADE EXCALIBUR 4.0X13 (MISCELLANEOUS) IMPLANT
BLADE SURG 15 STRL LF DISP TIS (BLADE) IMPLANT
BLADE SURG 15 STRL SS (BLADE)
BNDG COHESIVE 4X5 TAN STRL (GAUZE/BANDAGES/DRESSINGS) ×3 IMPLANT
BURR OVAL 8 FLU 4.0MM X 13CM (MISCELLANEOUS) ×1
BURR OVAL 8 FLU 4.0X13 (MISCELLANEOUS) ×2 IMPLANT
CANNULA 5.75X71 LONG (CANNULA) ×3 IMPLANT
CANNULA SHOULDER 7CM (CANNULA) ×3 IMPLANT
CANNULA TWIST IN 8.25X7CM (CANNULA) IMPLANT
CLOSURE WOUND 1/2 X4 (GAUZE/BANDAGES/DRESSINGS)
COVER WAND RF STERILE (DRAPES) IMPLANT
DECANTER SPIKE VIAL GLASS SM (MISCELLANEOUS) IMPLANT
DERMABOND ADVANCED (GAUZE/BANDAGES/DRESSINGS)
DERMABOND ADVANCED .7 DNX12 (GAUZE/BANDAGES/DRESSINGS) IMPLANT
DISSECTOR  3.8MM X 13CM (MISCELLANEOUS)
DISSECTOR 3.8MM X 13CM (MISCELLANEOUS) IMPLANT
DRAPE HALF SHEET 70X43 (DRAPES) ×3 IMPLANT
DRAPE IMP U-DRAPE 54X76 (DRAPES) ×3 IMPLANT
DRAPE INCISE IOBAN 66X45 STRL (DRAPES) IMPLANT
DRAPE STERI 35X30 U-POUCH (DRAPES) ×3 IMPLANT
DRAPE SURG 17X23 STRL (DRAPES) ×3 IMPLANT
DRAPE U-SHAPE 47X51 STRL (DRAPES) ×3 IMPLANT
DRAPE U-SHAPE 76X120 STRL (DRAPES) ×6 IMPLANT
DRSG PAD ABDOMINAL 8X10 ST (GAUZE/BANDAGES/DRESSINGS) ×3 IMPLANT
DURAPREP 26ML APPLICATOR (WOUND CARE) ×3 IMPLANT
DW OUTFLOW CASSETTE/TUBE SET (MISCELLANEOUS) IMPLANT
ELECT REM PT RETURN 9FT ADLT (ELECTROSURGICAL) ×3
ELECTRODE REM PT RTRN 9FT ADLT (ELECTROSURGICAL) ×1 IMPLANT
FIBER TAPE 2MM (SUTURE) IMPLANT
GAUZE SPONGE 4X4 12PLY STRL (GAUZE/BANDAGES/DRESSINGS) ×3 IMPLANT
GAUZE XEROFORM 1X8 LF (GAUZE/BANDAGES/DRESSINGS) ×3 IMPLANT
GLOVE BIOGEL PI IND STRL 7.0 (GLOVE) ×1 IMPLANT
GLOVE BIOGEL PI INDICATOR 7.0 (GLOVE) ×2
GLOVE ECLIPSE 7.0 STRL STRAW (GLOVE) ×3 IMPLANT
GLOVE SKINSENSE NS SZ7.5 (GLOVE) ×2
GLOVE SKINSENSE STRL SZ7.5 (GLOVE) ×1 IMPLANT
GLOVE SURG SYN 7.5  E (GLOVE) ×6
GLOVE SURG SYN 7.5 E (GLOVE) ×2 IMPLANT
GLOVE SURG SYN 7.5 PF PI (GLOVE) ×2 IMPLANT
GOWN STRL REIN XL XLG (GOWN DISPOSABLE) ×3 IMPLANT
GOWN STRL REUS W/ TWL LRG LVL3 (GOWN DISPOSABLE) ×1 IMPLANT
GOWN STRL REUS W/ TWL XL LVL3 (GOWN DISPOSABLE) ×1 IMPLANT
GOWN STRL REUS W/TWL LRG LVL3 (GOWN DISPOSABLE) ×3
GOWN STRL REUS W/TWL XL LVL3 (GOWN DISPOSABLE) ×3
MANIFOLD NEPTUNE II (INSTRUMENTS) ×3 IMPLANT
NDL SCORPION MULTI FIRE (NEEDLE) IMPLANT
NEEDLE SCORPION MULTI FIRE (NEEDLE) IMPLANT
PACK BASIN DAY SURGERY FS (CUSTOM PROCEDURE TRAY) ×3 IMPLANT
PACK DSU ARTHROSCOPY (CUSTOM PROCEDURE TRAY) ×3 IMPLANT
PORT APPOLLO RF 90DEGREE MULTI (SURGICAL WAND) IMPLANT
SHEET MEDIUM DRAPE 40X70 STRL (DRAPES) ×3 IMPLANT
SLEEVE SCD COMPRESS KNEE MED (MISCELLANEOUS) ×3 IMPLANT
SLING ARM FOAM STRAP LRG (SOFTGOODS) IMPLANT
SLING ARM FOAM STRAP MED (SOFTGOODS) ×2 IMPLANT
STRIP CLOSURE SKIN 1/2X4 (GAUZE/BANDAGES/DRESSINGS) IMPLANT
SUT ETHILON 3 0 PS 1 (SUTURE) ×3 IMPLANT
SUT FIBERWIRE #2 38 T-5 BLUE (SUTURE)
SUT MNCRL AB 4-0 PS2 18 (SUTURE) ×3 IMPLANT
SUT PDS AB 1 CT  36 (SUTURE)
SUT PDS AB 1 CT 36 (SUTURE) IMPLANT
SUT TIGER TAPE 7 IN WHITE (SUTURE) IMPLANT
SUT VIC AB 2-0 CT1 27 (SUTURE) ×3
SUT VIC AB 2-0 CT1 TAPERPNT 27 (SUTURE) ×1 IMPLANT
SUTURE FIBERWR #2 38 T-5 BLUE (SUTURE) IMPLANT
SUTURE TAPE 1.3 40 TPR END (SUTURE) IMPLANT
SUTURE TAPE TIGERLINK 1.3MM BL (SUTURE) IMPLANT
SUTURETAPE 1.3 40 TPR END (SUTURE)
SUTURETAPE TIGERLINK 1.3MM BL (SUTURE)
SYR 50ML LL SCALE MARK (SYRINGE) ×3 IMPLANT
TOWEL GREEN STERILE FF (TOWEL DISPOSABLE) ×3 IMPLANT
TUBE CONNECTING 20'X1/4 (TUBING)
TUBE CONNECTING 20X1/4 (TUBING) IMPLANT
TUBING ARTHROSCOPY IRRIG 16FT (MISCELLANEOUS) IMPLANT
WATER STERILE IRR 1000ML POUR (IV SOLUTION) ×3 IMPLANT

## 2019-08-25 NOTE — Anesthesia Preprocedure Evaluation (Addendum)
Anesthesia Evaluation  Patient identified by MRN, date of birth, ID band Patient awake    Reviewed: Allergy & Precautions, NPO status , Patient's Chart, lab work & pertinent test results  Airway Mallampati: II  TM Distance: >3 FB Neck ROM: Full    Dental  (+) Edentulous Upper, Edentulous Lower   Pulmonary former smoker,    breath sounds clear to auscultation       Cardiovascular hypertension,  Rhythm:Regular Rate:Normal     Neuro/Psych    GI/Hepatic   Endo/Other    Renal/GU      Musculoskeletal   Abdominal   Peds  Hematology   Anesthesia Other Findings   Reproductive/Obstetrics                            Anesthesia Physical Anesthesia Plan  ASA: III  Anesthesia Plan: General   Post-op Pain Management:  Regional for Post-op pain   Induction: Intravenous  PONV Risk Score and Plan: Ondansetron and Dexamethasone  Airway Management Planned: Oral ETT  Additional Equipment:   Intra-op Plan:   Post-operative Plan: Extubation in OR  Informed Consent: I have reviewed the patients History and Physical, chart, labs and discussed the procedure including the risks, benefits and alternatives for the proposed anesthesia with the patient or authorized representative who has indicated his/her understanding and acceptance.       Plan Discussed with: CRNA and Anesthesiologist  Anesthesia Plan Comments:         Anesthesia Quick Evaluation

## 2019-08-25 NOTE — Anesthesia Procedure Notes (Signed)
Anesthesia Regional Block: Interscalene brachial plexus block   Pre-Anesthetic Checklist: ,, timeout performed, Correct Patient, Correct Site, Correct Laterality, Correct Procedure, Correct Position, site marked, Risks and benefits discussed,  Surgical consent,  Pre-op evaluation,  At surgeon's request and post-op pain management  Laterality: Right  Prep: chloraprep       Needles:  Injection technique: Single-shot  Needle Type: Stimulator Needle - 40      Needle Gauge: 22     Additional Needles:   Procedures:, nerve stimulator,,,,,,,  Narrative:  Start time: 08/25/2019 8:00 AM End time: 08/25/2019 8:10 AM Injection made incrementally with aspirations every 5 mL.  Performed by: Personally  Anesthesiologist: Roberts Gaudy, MD  Additional Notes: 20 cc 0.5% Bupivacaine with 1:200 epi 10 cc 1.3% Exparel

## 2019-08-25 NOTE — Anesthesia Postprocedure Evaluation (Signed)
Anesthesia Post Note  Patient: Olivia Ewing  Procedure(s) Performed: RIGHT SHOULDER ARTHROSCOPY WITH DEBRIDEMENT, SUBACROMIAL DECOMPRESSION AND DISTAL CLAVICLE EXCISION (Right Shoulder)     Patient location during evaluation: PACU Anesthesia Type: Regional and General Level of consciousness: awake and alert Pain management: pain level controlled Vital Signs Assessment: post-procedure vital signs reviewed and stable Respiratory status: spontaneous breathing, nonlabored ventilation, respiratory function stable and patient connected to nasal cannula oxygen Cardiovascular status: blood pressure returned to baseline and stable Postop Assessment: no apparent nausea or vomiting Anesthetic complications: no   No complications documented.  Last Vitals:  Vitals:   08/25/19 1100 08/25/19 1115  BP: (!) 132/92 118/87  Pulse: 76 76  Resp: 19 (!) 21  Temp:    SpO2: 98% 99%    Last Pain:  Vitals:   08/25/19 1115  TempSrc:   PainSc: 0-No pain                 Christo Hain COKER

## 2019-08-25 NOTE — Op Note (Signed)
   Date of Surgery: 08/25/2019  INDICATIONS: The patient is a 69 year old female with right shoulder pain that has failed conservative treatment;  The patient did consent to the procedure after discussion of the risks and benefits.  PREOPERATIVE DIAGNOSIS:  1.  Partial articular surface supraspinatus tear 2.  Severe tendinosis of infraspinatus without full-thickness tear 3.  Degenerative proximal biceps and superior labral tear 4.  Right shoulder synovitis 5.  Right shoulder impingement and AC joint arthrosis 6.  Upper subscapularis tendon tear  POSTOPERATIVE DIAGNOSIS: Same.  PROCEDURE:  1.  Arthroscopic right shoulder distal clavicle excision 2.  Arthroscopic right shoulder extensive debridement of supraspinatus, infraspinatus, labrum, biceps, synovitis, subacromial bursitis 3.  Arthroscopic right shoulder subacromial decompression with acromioplasty and CA ligament release  SURGEON: N. Eduard Roux, M.D.  ASSIST: Ciro Backer [place], [place]; necessary for the timely completion of procedure and due to complexity of procedure..  ANESTHESIA:  general, regional  IV FLUIDS AND URINE: See anesthesia.  ESTIMATED BLOOD LOSS: minimal mL.  IMPLANTS: None  COMPLICATIONS: None.  DESCRIPTION OF PROCEDURE: The patient was brought to the operating room and placed supine on the operating table.  The patient had been signed prior to the procedure and this was documented. The patient had the anesthesia placed by the anesthesiologist.  A time-out was performed to confirm that this was the correct patient, site, side and location. The patient did receive antibiotics prior to the incision and was re-dosed during the procedure as needed at indicated intervals.  The patient was then positioned into the beach chair position with all bony prominences well padded and neutral C spine. The patient had the operative extremity prepped and draped in the standard surgical fashion.    Incisions were made for  shoulder arthroscopy portals.  Diagnostic shoulder arthroscopy was first performed.  Debridement of the torn upper subscapularis tendon and proximal biceps and superior labrum was carried out with an oscillating shaver.  A biceps tenotomy was then performed using ArthroCare wand.  The stump was then debrided back to a smooth and stable surface.  She did demonstrate some mild synovitis in the rotator interval.  The glenohumeral joint had mild chondromalacia without any focal defects.  The articular surface of the rotator cuff showed mainly partial articular surface tear of the anterior portion of the supraspinatus.  The arthroscope was then repositioned into the subacromial space.  Subacromial decompression, subacromial bursectomy, acromioplasty, CA ligament release were carried out with an oscillating shaver.  Hemostasis was obtained along the way.  The distal clavicle was identified and a distal clavicle excision was performed taking approximately 8 cm distal clavicle.  We then took the humeral head through full range of motion to evaluate the superior rotator cuff which demonstrated no evidence of full-thickness tear.  There was moderate tendinosis of the supraspinatus and infraspinatus on the bursal side.  Excess fluid was then removed from the shoulder joint.  Incisions were closed with interrupted nylon sutures.  Sterile dressings were applied.  Shoulder sling placed.  Patient tolerated procedure well had no immediate complications.  POSTOPERATIVE PLAN: Discharge home and follow-up in 1 week.  Azucena Cecil, MD [facility], [facility] [phone] 9:38 AM

## 2019-08-25 NOTE — Transfer of Care (Signed)
Immediate Anesthesia Transfer of Care Note  Patient: ROME SCHLAUCH  Procedure(s) Performed: RIGHT SHOULDER ARTHROSCOPY WITH DEBRIDEMENT, SUBACROMIAL DECOMPRESSION AND DISTAL CLAVICLE EXCISION (Right Shoulder)  Patient Location: PACU  Anesthesia Type:General and Regional  Level of Consciousness: awake and drowsy  Airway & Oxygen Therapy: Patient connected to face mask oxygen  Post-op Assessment: Report given to RN and Post -op Vital signs reviewed and stable  Post vital signs: Reviewed and stable  Last Vitals:  Vitals Value Taken Time  BP 139/85 08/25/19 0951  Temp    Pulse 82 08/25/19 0954  Resp 29 08/25/19 0954  SpO2 91 % 08/25/19 0954  Vitals shown include unvalidated device data.  Last Pain:  Vitals:   08/25/19 0659  TempSrc: Oral  PainSc: 0-No pain         Complications: No complications documented.

## 2019-08-25 NOTE — Progress Notes (Signed)
Assisted Dr. Joslin with right, ultrasound guided, interscalene  block. Side rails up, monitors on throughout procedure. See vital signs in flow sheet. Tolerated Procedure well. 

## 2019-08-25 NOTE — H&P (Signed)
PREOPERATIVE H&P  Chief Complaint: right biceps tendinosis, partial rotator cuff tear  HPI: Olivia Ewing is a 69 y.o. female who presents for surgical treatment of right biceps tendinosis, partial rotator cuff tear.  She denies any changes in medical history.  Past Medical History:  Diagnosis Date  . Anemia   . Arthritis    knees hands  . Bipolar disorder ([place])   . Cervicalgia   . Depression   . Environmental allergies    cause SOB, uses inhaler for  . Environmental and seasonal allergies    uses inhaler prn  . Full dentures   . GERD (gastroesophageal reflux disease)    diet controlled - no meds  . Hyperlipidemia    diet controlled, no meds  . Hypertension   . Lumbar radiculopathy, chronic   . Right knee pain    posterior horn medial meniscal tear MRI 2013  . Spinal stenosis    getting epidural injections -last one 06/12/2015  . SVD (spontaneous vaginal delivery)    x 4   Past Surgical History:  Procedure Laterality Date  . APPENDECTOMY    . Bladder tack  10/2006   cystocoele  . BREAST SURGERY Right    benign cyst  . COLONOSCOPY    . Hysterectomy other    . LUMBAR LAMINECTOMY N/A 03/16/2019   Procedure: CENTRAL LAMINECTOMIES L2-3, L3-4 AND L4-5;  Surgeon: Jessy Oto, MD;  Location: [place];  Service: Orthopedics;  Laterality: N/A;  . TOTAL HIP ARTHROPLASTY Right 04/27/2018   Procedure: RIGHT TOTAL HIP ARTHROPLASTY ANTERIOR APPROACH;  Surgeon: Leandrew Koyanagi, MD;  Location: [place];  Service: Orthopedics;  Laterality: Right;  . TUBAL LIGATION     Social History   Socioeconomic History  . Marital status: Divorced    Spouse name: Not on file  . Number of children: 4  . Years of education: Not on file  . Highest education level: Not on file  Occupational History  . Not on file  Tobacco Use  . Smoking status: Former Smoker    Packs/day: 0.10    Quit date: 11/26/1983    Years since quitting: 35.7  . Smokeless tobacco: Never Used  Vaping Use  . Vaping Use:  Never used  Substance and Sexual Activity  . Alcohol use: Yes    Alcohol/week: 0.0 standard drinks    Comment: rarely  . Drug use: Not Currently    Types: Marijuana    Comment: last used 2019  . Sexual activity: Not Currently    Birth control/protection: Surgical  Other Topics Concern  . Not on file  Social History Narrative   Single. 9 siblings. 1 brother with HIV, 1 sister with hypertension. 4 children, 1 daughter with HTN and depression, 1 son with HTN.   [phone]- shelter's number.    Former smoker, no alcohol or drug use.   Social Determinants of Health   Financial Resource Strain:   . Difficulty of Paying Living Expenses:   Food Insecurity:   . Worried About Charity fundraiser in the Last Year:   . Arboriculturist in the Last Year:   Transportation Needs:   . Film/video editor (Medical):   Marland Kitchen Lack of Transportation (Non-Medical):   Physical Activity:   . Days of Exercise per Week:   . Minutes of Exercise per Session:   Stress:   . Feeling of Stress :   Social Connections:   . Frequency of Communication with Friends and  Family:   . Frequency of Social Gatherings with Friends and Family:   . Attends Religious Services:   . Active Member of Clubs or Organizations:   . Attends Archivist Meetings:   Marland Kitchen Marital Status:    Family History  Problem Relation Age of Onset  . Hypertension Mother    Allergies  Allergen Reactions  . Tramadol     Hallucinate    Prior to Admission medications   Medication Sig Start Date End Date Taking? Authorizing Provider  albuterol (VENTOLIN HFA) 108 (90 Base) MCG/ACT inhaler Inhale 1-2 puffs into the lungs every 6 (six) hours as needed for wheezing or shortness of breath. 01/29/19  Yes Katherine Roan, MD  amLODipine (NORVASC) 5 MG tablet TAKE 1 TABLET (5 MG TOTAL) BY MOUTH DAILY. 08/11/19  Yes Oda Kilts, MD  busPIRone (BUSPAR) 10 MG tablet TAKE 1 TABLET BY MOUTH TWICE A DAY 08/11/19  Yes Oda Kilts,  MD  cyclobenzaprine (FLEXERIL) 10 MG tablet Take 1 tablet (10 mg total) by mouth 3 (three) times daily as needed for muscle spasms. 07/13/19  Yes Jessy Oto, MD  diclofenac (VOLTAREN) 75 MG EC tablet Take 1 tablet (75 mg total) by mouth 2 (two) times daily with a meal. 12/27/10 01/04/20 Yes Sherren Kerns, MD  ferrous gluconate (FERGON) 324 MG tablet Take 1 tablet (324 mg total) by mouth daily with breakfast. 07/29/19  Yes Winfrey, Jenne Pane, MD  fluticasone (FLONASE) 50 MCG/ACT nasal spray PLACE 1-2 SPRAYS INTO BOTH NOSTRILS DAILY. 08/23/19 08/22/20 Yes Marianna Payment, MD  lisinopril-hydrochlorothiazide (ZESTORETIC) 20-12.5 MG tablet TAKE 1 TABLET BY MOUTH DAILY Patient taking differently: Take 1 tablet by mouth daily.  02/03/19  Yes Katherine Roan, MD  loratadine (CLARITIN) 10 MG tablet TAKE 1 TABLET BY MOUTH EVERY DAY 07/22/19  Yes Katherine Roan, MD  Multiple Vitamin (MULTIVITAMIN WITH MINERALS) TABS tablet Take 1 tablet by mouth daily.   Yes [provider]  promethazine (PHENERGAN) 25 MG tablet Take 1 tablet (25 mg total) by mouth every 6 (six) hours as needed for nausea. 04/27/18  Yes Leandrew Koyanagi, MD  QUEtiapine (SEROQUEL) 50 MG tablet Take 50 mg by mouth at bedtime.   Yes [provider]  rosuvastatin (CRESTOR) 20 MG tablet Take 1 tablet (20 mg total) by mouth daily. 07/29/19 04/24/20 Yes Katherine Roan, MD  sertraline (ZOLOFT) 50 MG tablet TAKE 1 TABLET (50 MG TOTAL) BY MOUTH DAILY. 08/11/19  Yes Oda Kilts, MD  vitamin E 400 UNIT capsule Take 400 Units by mouth daily.   Yes [provider]  diclofenac (VOLTAREN) 75 MG EC tablet TAKE 1 TABLET BY MOUTH TWICE A DAY 07/01/19   Jessy Oto, MD  tiZANidine (ZANAFLEX) 4 MG tablet Take 1 tablet (4 mg total) by mouth every 6 (six) hours as needed for muscle spasms. 06/25/19   Jessy Oto, MD     Positive ROS: All other systems have been reviewed and were otherwise negative with the exception of those  mentioned in the HPI and as above.  Physical Exam: General: Alert, no acute distress Cardiovascular: No pedal edema Respiratory: No cyanosis, no use of accessory musculature GI: abdomen soft Skin: No lesions in the area of chief complaint Neurologic: Sensation intact distally Psychiatric: Patient is competent for consent with normal mood and affect Lymphatic: no lymphedema  MUSCULOSKELETAL: exam stable  Assessment: right biceps tendinosis, partial rotator cuff tear  Plan: Plan for Procedure(s): RIGHT SHOULDER  ARTHROSCOPY WITH DEBRIDEMENT, SUBACROMIAL DECOMPRESSION AND DISTAL CLAVICLE EXCISION  The risks benefits and alternatives were discussed with the patient including but not limited to the risks of nonoperative treatment, versus surgical intervention including infection, bleeding, nerve injury,  blood clots, cardiopulmonary complications, morbidity, mortality, among others, and they were willing to proceed.   Preoperative templating of the joint replacement has been completed, documented, and submitted to the Operating Room personnel in order to optimize intra-operative equipment management.   Eduard Roux, MD 08/25/2019 6:54 AM

## 2019-08-25 NOTE — Anesthesia Procedure Notes (Signed)
Procedure Name: Intubation Date/Time: 08/25/2019 8:43 AM Performed by: Collier Bullock, CRNA Pre-anesthesia Checklist: Patient identified, Emergency Drugs available, Suction available and Patient being monitored Patient Re-evaluated:Patient Re-evaluated prior to induction Oxygen Delivery Method: Circle system utilized Preoxygenation: Pre-oxygenation with 100% oxygen Induction Type: IV induction Ventilation: Oral airway inserted - appropriate to patient size and Nasal airway inserted- appropriate to patient size Laryngoscope Size: Mac and 4 Grade View: Grade III Tube type: Oral Tube size: 7.0 mm Number of attempts: 1 Airway Equipment and Method: Stylet Placement Confirmation: ETT inserted through vocal cords under direct vision,  positive ETCO2 and breath sounds checked- equal and bilateral Secured at: 23 cm Tube secured with: Tape Dental Injury: Teeth and Oropharynx as per pre-operative assessment

## 2019-08-25 NOTE — Discharge Instructions (Signed)
  Post-operative patient instructions  Shoulder Arthroscopy   . Ice:  Place intermittent ice or cooler pack over your shoulder, 30 minutes on and 30 minutes off.  Continue this for the first 72 hours after surgery, then save ice for use after therapy sessions or on more active days.   . Weight:  You may bear weight on your arm as your symptoms allow. . Motion:  Perform gentle shoulder motion as tolerated . Dressing:  Perform 1st dressing change at 2 days postoperative. A moderate amount of blood tinged drainage is to be expected.  So if you bleed through the dressing on the first or second day or if you have fevers, it is fine to change the dressing/check the wounds early and redress wound.  If it bleeds through again, or if the incisions are leaking frank blood, please call the office. May change dressing every 1-2 days thereafter to help watch wounds. Can purchase Tegaderm (or 3M Nexcare) water resistant dressings at local pharmacy / [employer]. . Shower:  Light shower is ok after 2 days.  Please take shower, NO bath. Recover with gauze and ace wrap to help keep wounds protected.   . Pain medication:  A narcotic pain medication has been prescribed.  Take as directed.  Typically you need narcotic pain medication more regularly during the first 3 to 5 days after surgery.  Decrease your use of the medication as the pain improves.  Narcotics can sometimes cause constipation, even after a few doses.  If you have problems with constipation, you can take an over the counter stool softener or light laxative.  If you have persistent problems, please notify your physician's office. . Physical therapy: Additional activity guidelines to be provided by your physician or physical therapist at follow-up visits.  . Driving: Do not recommend driving x 2 weeks post surgical, especially if surgery performed on right side. Should not drive while taking narcotic pain medications. It typically takes at least 2 weeks to  restore sufficient neuromuscular function for normal reaction times for driving safety.  . Call [phone] for questions or problems. Evenings you will be forwarded to the hospital operator.  Ask for the orthopaedic physician on call. Please call if you experience:    o Redness, foul smelling, or persistent drainage from the surgical site  o worsening shoulder pain and swelling not responsive to medication  o any calf pain and or swelling of the lower leg  o temperatures greater than 101.5 F o other questions or concerns   Thank you for allowing us to be a part of your care.    Post Anesthesia Home Care Instructions  Activity: Get plenty of rest for the remainder of the day. A responsible individual must stay with you for 24 hours following the procedure.  For the next 24 hours, DO NOT: -Drive a car -Operate machinery -Drink alcoholic beverages -Take any medication unless instructed by your physician -Make any legal decisions or sign important papers.  Meals: Start with liquid foods such as gelatin or soup. Progress to regular foods as tolerated. Avoid greasy, spicy, heavy foods. If nausea and/or vomiting occur, drink only clear liquids until the nausea and/or vomiting subsides. Call your physician if vomiting continues.  Special Instructions/Symptoms: Your throat may feel dry or sore from the anesthesia or the breathing tube placed in your throat during surgery. If this causes discomfort, gargle with warm salt water. The discomfort should disappear within 24 hours.  If you had a scopolamine patch   placed behind your ear for the management of post- operative nausea and/or vomiting:  1. The medication in the patch is effective for 72 hours, after which it should be removed.  Wrap patch in a tissue and discard in the trash. Wash hands thoroughly with soap and water. 2. You may remove the patch earlier than 72 hours if you experience unpleasant side effects which may include dry  mouth, dizziness or visual disturbances. 3. Avoid touching the patch. Wash your hands with soap and water after contact with the patch.    Regional Anesthesia Blocks  1. Numbness or the inability to move the "blocked" extremity may last from 3-48 hours after placement. The length of time depends on the medication injected and your individual response to the medication. If the numbness is not going away after 48 hours, call your surgeon.  2. The extremity that is blocked will need to be protected until the numbness is gone and the  Strength has returned. Because you cannot feel it, you will need to take extra care to avoid injury. Because it may be weak, you may have difficulty moving it or using it. You may not know what position it is in without looking at it while the block is in effect.  3. For blocks in the legs and feet, returning to weight bearing and walking needs to be done carefully. You will need to wait until the numbness is entirely gone and the strength has returned. You should be able to move your leg and foot normally before you try and bear weight or walk. You will need someone to be with you when you first try to ensure you do not fall and possibly risk injury.  4. Bruising and tenderness at the needle site are common side effects and will resolve in a few days.  5. Persistent numbness or new problems with movement should be communicated to the surgeon or the [facility] ([phone])/ [facility] ([phone]).Information for Discharge Teaching: EXPAREL (bupivacaine liposome injectable suspension)   Your surgeon or anesthesiologist gave you EXPAREL(bupivacaine) to help control your pain after surgery.   EXPAREL is a local anesthetic that provides pain relief by numbing the tissue around the surgical site.  EXPAREL is designed to release pain medication over time and can control pain for up to 72 hours.  Depending on how you respond to EXPAREL, you  may require less pain medication during your recovery.  Possible side effects:  Temporary loss of sensation or ability to move in the area where bupivacaine was injected.  Nausea, vomiting, constipation  Rarely, numbness and tingling in your mouth or lips, lightheadedness, or anxiety may occur.  Call your doctor right away if you think you may be experiencing any of these sensations, or if you have other questions regarding possible side effects.  Follow all other discharge instructions given to you by your surgeon or nurse. Eat a healthy diet and drink plenty of water or other fluids.  If you return to the hospital for any reason within 96 hours following the administration of EXPAREL, it is important for health care providers to know that you have received this anesthetic. A teal colored band has been placed on your arm with the date, time and amount of EXPAREL you have received in order to alert and inform your health care providers. Please leave this armband in place for the full 96 hours following administration, and then you may remove the band. 

## 2019-08-27 ENCOUNTER — Other Ambulatory Visit: Payer: Self-pay | Admitting: Physician Assistant

## 2019-08-27 ENCOUNTER — Telehealth: Payer: Self-pay | Admitting: Orthopaedic Surgery

## 2019-08-27 MED ORDER — METHOCARBAMOL 500 MG PO TABS: 500.0000 mg | ORAL_TABLET | Freq: Two times a day (BID) | ORAL | 0 refills | Status: DC | PRN

## 2019-08-27 NOTE — Addendum Note (Signed)
Addendum  created 08/27/19 0758 by Michelangelo Rindfleisch, Ernesta Amble, CRNA   Charge Capture section accepted, Visit diagnoses modified

## 2019-08-27 NOTE — Telephone Encounter (Signed)
Patient called needing Rx refilled (Hydrocodone)  Patient asked if she can get Hydrocodone 10's because the pain is really bad. Patient said she has surgery on Wednesday (Right Shoulder)  The number to contact patient is [phone]

## 2019-08-27 NOTE — Telephone Encounter (Signed)
Patient called. She would like a muscle relaxer called in for her. Her call back number is [phone]

## 2019-08-27 NOTE — Telephone Encounter (Signed)
I sent in robaxin.  

## 2019-08-27 NOTE — Telephone Encounter (Signed)
Can you please call in muscle relaxer into pharm. See other messages.

## 2019-08-27 NOTE — Telephone Encounter (Signed)
Patient aware.

## 2019-08-27 NOTE — Telephone Encounter (Signed)
Called patient no answer LMOM. Just need to advise on message below. Thanks.

## 2019-08-27 NOTE — Telephone Encounter (Signed)
She was given oxycodone which is actually stronger than norco 10.  Also, it is too early to refill.  She should have enough to get her through Sunday if taking how it was prescribed.  She can add ibuprofen 800 mg tid.  I can also call in a muscle relaxer if she would like to try that

## 2019-09-01 ENCOUNTER — Ambulatory Visit: Payer: Medicare HMO | Admitting: Licensed Clinical Social Worker

## 2019-09-01 ENCOUNTER — Ambulatory Visit (INDEPENDENT_AMBULATORY_CARE_PROVIDER_SITE_OTHER): Payer: Medicare HMO | Admitting: Orthopaedic Surgery

## 2019-09-01 ENCOUNTER — Encounter: Payer: Self-pay | Admitting: Orthopaedic Surgery

## 2019-09-01 DIAGNOSIS — M19011 Primary osteoarthritis, right shoulder: Secondary | ICD-10-CM

## 2019-09-01 DIAGNOSIS — M67921 Unspecified disorder of synovium and tendon, right upper arm: Secondary | ICD-10-CM

## 2019-09-01 DIAGNOSIS — M75111 Incomplete rotator cuff tear or rupture of right shoulder, not specified as traumatic: Secondary | ICD-10-CM

## 2019-09-01 MED ORDER — HYDROCODONE-ACETAMINOPHEN 7.5-325 MG PO TABS: 1.0000 | ORAL_TABLET | Freq: Two times a day (BID) | ORAL | 0 refills | Status: DC | PRN

## 2019-09-01 NOTE — Progress Notes (Signed)
Post-Op Visit Note   Patient: Olivia Ewing           Date of Birth: [birth year]           MRN: [id number] Visit Date: 09/01/2019 PCP: [name], MD   Assessment & Plan:  Chief Complaint:  Chief Complaint  Patient presents with  . Right Shoulder - Pain   Visit Diagnoses:  1. Nontraumatic incomplete tear of right rotator cuff   2. Tendinopathy of right biceps tendon   3. Arthrosis of right acromioclavicular joint     Plan: Delila is 1 week status post right shoulder scope and debridement and subacromial decompression with biceps tenotomy.  Overall doing well.  Reports 8 out of 10 pain at worst.  Surgical incisions are healed.  Neurovascular intact.  Sutures removed today.  Formal PT referral is made.  Wean sling as tolerated.  Follow-up in 4 weeks for recheck.  Follow-Up Instructions: Return in about 4 weeks (around 09/29/2019).   Orders:  Orders Placed This Encounter  Procedures  . Ambulatory referral to Physical Therapy   Meds ordered this encounter  Medications  . HYDROcodone-acetaminophen (NORCO) 7.5-325 MG tablet    Sig: Take 1-2 tablets by mouth 2 (two) times daily as needed for moderate pain.    Dispense:  30 tablet    Refill:  0    Imaging: No results found.  PMFS History: Patient Active Problem List   Diagnosis Date Noted  . Nontraumatic incomplete tear of right rotator cuff 07/27/2019  . Tendinopathy of right biceps tendon 07/27/2019  . Breast cancer screening by mammogram 06/07/2019  . Seasonal allergic rhinitis due to pollen 06/07/2019  . Colon cancer screening 06/07/2019  . Spinal stenosis, lumbar region, with neurogenic claudication 03/16/2019    Class: Chronic  . Status post lumbar laminectomy 03/16/2019  . Acute stress reaction 09/25/2018  . Primary osteoarthritis of right hip 04/27/2018  . Status post total hip replacement, 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  . Shortness of breath 01/02/2018  . Chalazion left upper eyelid 02/20/2017  . Vitamin D  insufficiency 11/25/2013  . Healthcare maintenance 11/25/2013  . Spinal stenosis of lumbar region 07/02/2013  . Arthritis, degenerative 11/17/2012  . Arthrosis of right acromioclavicular joint 06/05/2012  . Rectocele 04/28/2012  . Left knee DJD, degenerative meniscus tear 04/06/2012  . Numbness and tingling in hands 03/17/2012  . Periodontitis 09/23/2011  . Depression 09/23/2011  . Right knee meniscal tear 03/14/2011  . Lumbar radiculopathy 03/14/2011  . Stress incontinence of urine 10/18/2010  . NECK PAIN, CHRONIC 08/19/2008  . Normocytic anemia 05/12/2008  . SHOULDER PAIN, RIGHT 04/28/2008  . Hyperlipemia 06/18/2007  . Essential hypertension 05/20/2007   Past Medical History:  Diagnosis Date  . Anemia   . Arthritis    knees hands  . Bipolar disorder ([place])   . Cervicalgia   . Depression   . Environmental allergies    cause SOB, uses inhaler for  . Environmental and seasonal allergies    uses inhaler prn  . Full dentures   . GERD (gastroesophageal reflux disease)    diet controlled - no meds  . Hyperlipidemia    diet controlled, no meds  . Hypertension   . Lumbar radiculopathy, chronic   . Right knee pain    posterior horn medial meniscal tear MRI 2013  . Spinal stenosis    getting epidural injections -last one 06/12/2015  . SVD (spontaneous vaginal delivery)    x 4    Family History  Problem Relation  Age of Onset  . Hypertension Mother     Past Surgical History:  Procedure Laterality Date  . APPENDECTOMY    . Bladder tack  10/2006   cystocoele  . BREAST SURGERY Right    benign cyst  . COLONOSCOPY    . Hysterectomy other    . LUMBAR LAMINECTOMY N/A 03/16/2019   Procedure: CENTRAL LAMINECTOMIES L2-3, L3-4 AND L4-5;  Surgeon: Jessy Oto, MD;  Location: [facility];  Service: Orthopedics;  Laterality: N/A;  . TOTAL HIP ARTHROPLASTY Right 04/27/2018   Procedure: RIGHT TOTAL HIP ARTHROPLASTY ANTERIOR APPROACH;  Surgeon: Leandrew Koyanagi, MD;  Location: [facility];  Service:  Orthopedics;  Laterality: Right;  . TUBAL LIGATION     Social History   Occupational History  . Not on file  Tobacco Use  . Smoking status: Former Smoker    Packs/day: 0.10    Quit date: 11/26/1983    Years since quitting: 35.7  . Smokeless tobacco: Never Used  Vaping Use  . Vaping Use: Never used  Substance and Sexual Activity  . Alcohol use: Yes    Alcohol/week: 0.0 standard drinks    Comment: rarely  . Drug use: Not Currently    Types: Marijuana    Comment: last used 2019  . Sexual activity: Not Currently    Birth control/protection: Surgical

## 2019-09-07 ENCOUNTER — Ambulatory Visit: Payer: Medicare HMO | Admitting: Physical Therapy

## 2019-09-13 ENCOUNTER — Ambulatory Visit (INDEPENDENT_AMBULATORY_CARE_PROVIDER_SITE_OTHER): Payer: Medicare HMO | Admitting: Specialist

## 2019-09-13 ENCOUNTER — Encounter: Payer: Self-pay | Admitting: Orthopaedic Surgery

## 2019-09-13 ENCOUNTER — Other Ambulatory Visit: Payer: Self-pay

## 2019-09-13 ENCOUNTER — Encounter: Payer: Self-pay | Admitting: Specialist

## 2019-09-13 VITALS — BP 145/91 | HR 80 | Ht 65.0 in | Wt 193.0 lb

## 2019-09-13 DIAGNOSIS — G5603 Carpal tunnel syndrome, bilateral upper limbs: Secondary | ICD-10-CM

## 2019-09-13 MED ORDER — HYDROCODONE-ACETAMINOPHEN 7.5-325 MG PO TABS: 1.0000 | ORAL_TABLET | Freq: Two times a day (BID) | ORAL | 0 refills | Status: DC | PRN

## 2019-09-13 NOTE — Patient Instructions (Signed)
Carpal Tunnel Syndrome  Carpal tunnel syndrome is a condition that causes pain in your hand and arm. The carpal tunnel is a narrow area located on the palm side of your wrist. Repeated wrist motion or certain diseases may cause swelling within the tunnel. This swelling pinches the main nerve in the wrist (median nerve). What are the causes? This condition may be caused by:  Repeated wrist motions.  Wrist injuries.  Arthritis.  A cyst or tumor in the carpal tunnel.  Fluid buildup during pregnancy. Sometimes the cause of this condition is not known. What increases the risk? This condition is more likely to develop in:  People who have jobs that cause them to repeatedly move their wrists in the same motion, such as Art gallery manager.  Women.  People with certain conditions, such as: ? Diabetes. ? Obesity. ? An underactive thyroid (hypothyroidism). ? Kidney failure. What are the signs or symptoms? Symptoms of this condition include:  A tingling feeling in your fingers, especially in your thumb, index, and middle fingers.  Tingling or numbness in your hand.  An aching feeling in your entire arm, especially when your wrist and elbow are bent for long periods of time.  Wrist pain that goes up your arm to your shoulder.  Pain that goes down into your palm or fingers.  A weak feeling in your hands. You may have trouble grabbing and holding items. Your symptoms may feel worse during the night. How is this diagnosed? This condition is diagnosed with a medical history and physical exam. You may also have tests, including:  An electromyogram (EMG). This test measures electrical signals sent by your nerves into the muscles.  X-rays. How is this treated? Treatment for this condition includes:  Lifestyle changes. It is important to stop doing or modify the activity that caused your condition.  Physical or occupational therapy.  Medicines for pain and inflammation. This may  include medicine that is injected into your wrist.  A wrist splint.  Surgery. Follow these instructions at home: If you have a splint:   Wear it as told by your health care provider. Remove it only as told by your health care provider.  Loosen the splint if your fingers become numb and tingle, or if they turn cold and blue.  Keep the splint clean and dry. General instructions   Take over-the-counter and prescription medicines only as told by your health care provider.  Rest your wrist from any activity that may be causing your pain. If your condition is work related, talk to your employer about changes that can be made, such as getting a wrist pad to use while typing.  If directed, apply ice to the painful area: ? Put ice in a plastic bag. ? Place a towel between your skin and the bag. ? Leave the ice on for 20 minutes, 2-3 times per day.  Keep all follow-up visits as told by your health care provider. This is important.  Do any exercises as told by your health care provider, physical therapist, or occupational therapist. Contact a health care provider if:  You have new symptoms.  Your pain is not controlled with medicines.  Your symptoms get worse. This information is not intended to replace advice given to you by your health care provider. Make sure you discuss any questions you have with your health care provider.  May use the wrist splints as needed. Our office will contact you to schedule for a left open carpal tunnel release.  [name] is the surgery scheduler and she will get approval from your insurance and call you. Risk of surgery includes risk infection 1:300, bleeding is not usual, 1 in 1,000,000 risk of blood loss that is significant. Risk to the nerve is 1:30,000.  Document Released: 01/26/2000 Document Revised: 06/08/2015 Document Reviewed: 10/09/2016 Elsevier Interactive Patient Education  2017 [employer].

## 2019-09-13 NOTE — Progress Notes (Signed)
Office Visit Note   Patient: Olivia Ewing           Date of Birth: 02-May-1950           MRN: [id number] Visit Date: 09/13/2019              Requested by: Katherine Roan, MD No address on file PCP: [name], MD   Assessment & Plan: Visit Diagnoses:  1. Bilateral carpal tunnel syndrome     Plan: Carpal Tunnel Syndrome  Carpal tunnel syndrome is a condition that causes pain in your hand and arm. The carpal tunnel is a narrow area located on the palm side of your wrist. Repeated wrist motion or certain diseases may cause swelling within the tunnel. This swelling pinches the main nerve in the wrist (median nerve). What are the causes? This condition may be caused by:  Repeated wrist motions.  Wrist injuries.  Arthritis.  A cyst or tumor in the carpal tunnel.  Fluid buildup during pregnancy. Sometimes the cause of this condition is not known. What increases the risk? This condition is more likely to develop in:  People who have jobs that cause them to repeatedly move their wrists in the same motion, such as Art gallery manager.  Women.  People with certain conditions, such as: ? Diabetes. ? Obesity. ? An underactive thyroid (hypothyroidism). ? Kidney failure. What are the signs or symptoms? Symptoms of this condition include:  A tingling feeling in your fingers, especially in your thumb, index, and middle fingers.  Tingling or numbness in your hand.  An aching feeling in your entire arm, especially when your wrist and elbow are bent for long periods of time.  Wrist pain that goes up your arm to your shoulder.  Pain that goes down into your palm or fingers.  A weak feeling in your hands. You may have trouble grabbing and holding items. Your symptoms may feel worse during the night. How is this diagnosed? This condition is diagnosed with a medical history and physical exam. You may also have tests, including:  An electromyogram (EMG). This  test measures electrical signals sent by your nerves into the muscles.  X-rays. How is this treated? Treatment for this condition includes:  Lifestyle changes. It is important to stop doing or modify the activity that caused your condition.  Physical or occupational therapy.  Medicines for pain and inflammation. This may include medicine that is injected into your wrist.  A wrist splint.  Surgery. Follow these instructions at home: If you have a splint:   Wear it as told by your health care provider. Remove it only as told by your health care provider.  Loosen the splint if your fingers become numb and tingle, or if they turn cold and blue.  Keep the splint clean and dry. General instructions   Take over-the-counter and prescription medicines only as told by your health care provider.  Rest your wrist from any activity that may be causing your pain. If your condition is work related, talk to your employer about changes that can be made, such as getting a wrist pad to use while typing.  If directed, apply ice to the painful area: ? Put ice in a plastic bag. ? Place a towel between your skin and the bag. ? Leave the ice on for 20 minutes, 2-3 times per day.  Keep all follow-up visits as told by your health care provider. This is important.  Do any exercises as told by  your health care provider, physical therapist, or occupational therapist. Contact a health care provider if:  You have new symptoms.  Your pain is not controlled with medicines.  Your symptoms get worse. This information is not intended to replace advice given to you by your health care provider. Make sure you discuss any questions you have with your health care provider.  May use the wrist splints as needed. Our office will contact you to schedule for a left open carpal tunnel release. [name] is the surgery scheduler and she will get approval from your insurance and call you. Risk of surgery  includes risk infection 1:300, bleeding is not usual, 1 in 1,000,000 risk of blood loss that is significant. Risk to the nerve is 1:30,000.  Document Released: 01/26/2000 Document Revised: 06/08/2015 Document Reviewed: 10/09/2016  Follow-Up Instructions: No follow-ups on file.   Orders:  No orders of the defined types were placed in this encounter.  Meds ordered this encounter  Medications  . HYDROcodone-acetaminophen (NORCO) 7.5-325 MG tablet    Sig: Take 1-2 tablets by mouth 2 (two) times daily as needed for moderate pain.    Dispense:  30 tablet    Refill:  0      Procedures: No procedures performed   Clinical Data: No additional findings.   Subjective: Chief Complaint  Patient presents with  . Left Arm - Follow-up    EMG/NCS Review  . Right Arm - Follow-up    EMG/NCS Review    69 year old right handed female with 6 months history of severe left hand numbness and tingling into the left hand. Pain at night right hand that is severe with radiation into the right thumb throught long finger. No low back pain or neck pain. History of right rotator cuff repain. Underwent left and right hand EMG/NCV which returns demonstrating severe right carpal tunnel syndrome and moderately severe left carpal tunnel syndrome.    Review of Systems  Constitutional: Negative.   HENT: Negative.   Eyes: Negative.   Respiratory: Negative.   Cardiovascular: Negative.   Gastrointestinal: Negative.   Endocrine: Negative.   Genitourinary: Negative.   Musculoskeletal: Negative.   Skin: Negative.   Allergic/Immunologic: Negative.   Neurological: Negative.   Hematological: Negative.   Psychiatric/Behavioral: Negative.      Objective: Vital Signs: BP (!) 145/91 (BP Location: Left Arm, Patient Position: Sitting)   Pulse 80   Ht 5\' 5"  (1.651 m)   Wt 193 lb (87.5 kg)   BMI 32.12 kg/m   Physical Exam Constitutional:      Appearance: She is well-developed.  HENT:     Head:  Normocephalic and atraumatic.  Eyes:     Pupils: Pupils are equal, round, and reactive to light.  Pulmonary:     Effort: Pulmonary effort is normal.     Breath sounds: Normal breath sounds.  Abdominal:     General: Bowel sounds are normal.     Palpations: Abdomen is soft.  Musculoskeletal:     Cervical back: Normal range of motion and neck supple.  Skin:    General: Skin is warm and dry.  Neurological:     Mental Status: She is alert and oriented to person, place, and time.  Psychiatric:        Behavior: Behavior normal.        Thought Content: Thought content normal.        Judgment: Judgment normal.     Left Hand Exam   Tenderness  The patient  is experiencing tenderness in the palmar area.   Range of Motion  Wrist  Extension: abnormal  Flexion: abnormal  Pronation: abnormal  Supination: abnormal   Muscle Strength  Wrist extension: 5/5  Wrist flexion: 5/5  Grip:  5/5   Tests  Phalen's sign: positive Tinel's sign (median nerve): positive  Other  Erythema: absent Scars: absent Sensation: normal Pulse: present      Specialty Comments:  No specialty comments available.  Imaging: No results found.   PMFS History: Patient Active Problem List   Diagnosis Date Noted  . Spinal stenosis, lumbar region, with neurogenic claudication 03/16/2019    Priority: High    Class: Chronic  . Nontraumatic incomplete tear of right rotator cuff 07/27/2019  . Tendinopathy of right biceps tendon 07/27/2019  . Breast cancer screening by mammogram 06/07/2019  . Seasonal allergic rhinitis due to pollen 06/07/2019  . Colon cancer screening 06/07/2019  . Status post lumbar laminectomy 03/16/2019  . Acute stress reaction 09/25/2018  . Primary osteoarthritis of right hip 04/27/2018  . Status post total hip replacement, 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  . Shortness of breath 01/02/2018  . Chalazion left upper eyelid 02/20/2017  . Vitamin D insufficiency 11/25/2013  . Healthcare  maintenance 11/25/2013  . Spinal stenosis of lumbar region 07/02/2013  . Arthritis, degenerative 11/17/2012  . Arthrosis of right acromioclavicular joint 06/05/2012  . Rectocele 04/28/2012  . Left knee DJD, degenerative meniscus tear 04/06/2012  . Numbness and tingling in hands 03/17/2012  . Periodontitis 09/23/2011  . Depression 09/23/2011  . Right knee meniscal tear 03/14/2011  . Lumbar radiculopathy 03/14/2011  . Stress incontinence of urine 10/18/2010  . NECK PAIN, CHRONIC 08/19/2008  . Normocytic anemia 05/12/2008  . SHOULDER PAIN, RIGHT 04/28/2008  . Hyperlipemia 06/18/2007  . Essential hypertension 05/20/2007   Past Medical History:  Diagnosis Date  . Anemia   . Arthritis    knees hands  . Bipolar disorder ([place])   . Cervicalgia   . Depression   . Environmental allergies    cause SOB, uses inhaler for  . Environmental and seasonal allergies    uses inhaler prn  . Full dentures   . GERD (gastroesophageal reflux disease)    diet controlled - no meds  . Hyperlipidemia    diet controlled, no meds  . Hypertension   . Lumbar radiculopathy, chronic   . Right knee pain    posterior horn medial meniscal tear MRI 2013  . Spinal stenosis    getting epidural injections -last one 06/12/2015  . SVD (spontaneous vaginal delivery)    x 4    Family History  Problem Relation Age of Onset  . Hypertension Mother     Past Surgical History:  Procedure Laterality Date  . APPENDECTOMY    . Bladder tack  10/2006   cystocoele  . BREAST SURGERY Right    benign cyst  . COLONOSCOPY    . Hysterectomy other    . LUMBAR LAMINECTOMY N/A 03/16/2019   Procedure: CENTRAL LAMINECTOMIES L2-3, L3-4 AND L4-5;  Surgeon: Jessy Oto, MD;  Location: [place];  Service: Orthopedics;  Laterality: N/A;  . TOTAL HIP ARTHROPLASTY Right 04/27/2018   Procedure: RIGHT TOTAL HIP ARTHROPLASTY ANTERIOR APPROACH;  Surgeon: Leandrew Koyanagi, MD;  Location: [place];  Service: Orthopedics;  Laterality: Right;  . TUBAL  LIGATION     Social History   Occupational History  . Not on file  Tobacco Use  . Smoking status: Former Smoker    Packs/day: 0.10  Quit date: 11/26/1983    Years since quitting: 35.8  . Smokeless tobacco: Never Used  Vaping Use  . Vaping Use: Never used  Substance and Sexual Activity  . Alcohol use: Yes    Alcohol/week: 0.0 standard drinks    Comment: rarely  . Drug use: Not Currently    Types: Marijuana    Comment: last used 2019  . Sexual activity: Not Currently    Birth control/protection: Surgical

## 2019-09-14 ENCOUNTER — Other Ambulatory Visit: Payer: Self-pay | Admitting: Physician Assistant

## 2019-09-14 NOTE — Telephone Encounter (Signed)
It looks like dr.[email or web address] sent this in yesterday

## 2019-09-15 ENCOUNTER — Ambulatory Visit: Payer: Medicare HMO | Attending: Orthopaedic Surgery

## 2019-09-15 DIAGNOSIS — M25511 Pain in right shoulder: Secondary | ICD-10-CM | POA: Insufficient documentation

## 2019-09-15 DIAGNOSIS — M25611 Stiffness of right shoulder, not elsewhere classified: Secondary | ICD-10-CM | POA: Insufficient documentation

## 2019-09-18 ENCOUNTER — Other Ambulatory Visit: Payer: Self-pay | Admitting: Specialist

## 2019-09-18 DIAGNOSIS — M1711 Unilateral primary osteoarthritis, right knee: Secondary | ICD-10-CM

## 2019-09-18 DIAGNOSIS — M25562 Pain in left knee: Secondary | ICD-10-CM

## 2019-09-22 ENCOUNTER — Ambulatory Visit: Payer: Medicare HMO | Admitting: Licensed Clinical Social Worker

## 2019-09-24 ENCOUNTER — Other Ambulatory Visit: Payer: Self-pay

## 2019-09-24 ENCOUNTER — Ambulatory Visit: Payer: Medicare HMO | Admitting: Physical Therapy

## 2019-09-24 DIAGNOSIS — M25611 Stiffness of right shoulder, not elsewhere classified: Secondary | ICD-10-CM

## 2019-09-24 DIAGNOSIS — M25511 Pain in right shoulder: Secondary | ICD-10-CM | POA: Diagnosis present

## 2019-09-24 NOTE — Patient Instructions (Signed)
Access Code: [facility] URL: [email or web address] Date: 09/24/2019 Prepared by: Estill Bamberg April Thurnell Garbe  Exercises Seated Gentle Upper Trapezius Stretch - 1 x daily - 7 x weekly - 3 sets - 20-30 sec hold Gentle Levator Scapulae Stretch - 1 x daily - 7 x weekly - 3 sets - 20-30 sec hold Doorway Pec Stretch at 60 Elevation - 1 x daily - 7 x weekly - 3 sets - 20-30 sec hold Seated Scapular Retraction - 1 x daily - 7 x weekly - 3 sets - 10 reps Seated Shoulder Flexion Towel Slide at Table Top - 1 x daily - 7 x weekly - 3 sets - 10 reps Seated Shoulder Abduction Towel Slide at Table Top - 1 x daily - 7 x weekly - 3 sets - 10 reps

## 2019-09-24 NOTE — Therapy (Signed)
[place], [place], [address] Phone: [phone]   Fax:  [phone]  Physical Therapy Evaluation  Patient Details  Name: Olivia Ewing MRN: [id number] Date of Birth: 1950-09-14 Referring Provider (PT): Leandrew Koyanagi, MD   Encounter Date: 09/24/2019   PT End of Session - 09/24/19 0740    Visit Number 1    Number of Visits 16    Date for PT Re-Evaluation 11/19/19    Authorization Type [employer] MCR    Progress Note Due on Visit 10    PT Start Time 0745    PT Stop Time 0830    PT Time Calculation (min) 45 min    Activity Tolerance Patient tolerated treatment well    Behavior During Therapy [facility] for tasks assessed/performed           Past Medical History:  Diagnosis Date  . Anemia   . Arthritis    knees hands  . Bipolar disorder ([place])   . Cervicalgia   . Depression   . Environmental allergies    cause SOB, uses inhaler for  . Environmental and seasonal allergies    uses inhaler prn  . Full dentures   . GERD (gastroesophageal reflux disease)    diet controlled - no meds  . Hyperlipidemia    diet controlled, no meds  . Hypertension   . Lumbar radiculopathy, chronic   . Right knee pain    posterior horn medial meniscal tear MRI 2013  . Spinal stenosis    getting epidural injections -last one 06/12/2015  . SVD (spontaneous vaginal delivery)    x 4    Past Surgical History:  Procedure Laterality Date  . APPENDECTOMY    . Bladder tack  10/2006   cystocoele  . BREAST SURGERY Right    benign cyst  . COLONOSCOPY    . Hysterectomy other    . LUMBAR LAMINECTOMY N/A 03/16/2019   Procedure: CENTRAL LAMINECTOMIES L2-3, L3-4 AND L4-5;  Surgeon: Jessy Oto, MD;  Location: [place];  Service: Orthopedics;  Laterality: N/A;  . TOTAL HIP ARTHROPLASTY Right 04/27/2018   Procedure: RIGHT TOTAL HIP ARTHROPLASTY ANTERIOR APPROACH;  Surgeon: Leandrew Koyanagi, MD;  Location: [place];  Service: Orthopedics;   Laterality: Right;  . TUBAL LIGATION      There were no vitals filed for this visit.    Subjective Assessment - 09/24/19 0747    Subjective Pt reports she had surgery for her RTC on 7/14. Pt reports increased pain since surgery especially if she moves it the wrong way. 8/10 pain at rest, 10/10 pain at worst with movement. Pt reports no restrictions.    Limitations Lifting;House hold activities    Patient Stated Goals Putting on shirt without pain, lifting and carrying, sleep better    Currently in Pain? Yes    Pain Score 8     Pain Location Shoulder    Pain Orientation Right    Pain Onset More than a month ago    Pain Frequency Intermittent    Aggravating Factors  Certain movements    Pain Relieving Factors Heating pad and pillows that prop her up              [facility] PT Assessment - 09/24/19 0001      Assessment   Medical Diagnosis S/p right shoulder scope biceps tenotomy    Referring Provider (PT) Leandrew Koyanagi, MD    Onset Date/Surgical Date 08/25/19  Hand Dominance Right      Precautions   Precautions None      Restrictions   Weight Bearing Restrictions No      Balance Screen   Has the patient fallen in the past 6 months No      [place] residence      Prior Function   Level of Independence Independent      Observation/Other Assessments   Focus on Therapeutic Outcomes (FOTO)  62%      AROM   Right Shoulder Flexion 55 Degrees    Right Shoulder ABduction 45 Degrees   only able to complete scaption; abduction too painful   Right Shoulder Internal Rotation --   WFL   Right Shoulder External Rotation 25 Degrees    Right Shoulder Horizontal ABduction 125 Degrees    Right Shoulder Horizontal  ADduction 10 Degrees      PROM   Overall PROM Comments Difficulty performing due to pt guarding      Strength   Right Shoulder Flexion 2+/5    Right Shoulder ABduction 2+/5    Right Shoulder Internal Rotation 3+/5    Right  Shoulder External Rotation 3+/5    Right Shoulder Horizontal ABduction 3-/5    Right Elbow Flexion 3-/5    Right Elbow Extension 3/5    Right Forearm Pronation 3+/5    Right Forearm Supination 3+/5    Right Wrist Flexion 3+/5    Right Wrist Extension 3+/5    Right Hand Grip (lbs) 20-25                      Objective measurements completed on examination: See above findings.               PT Education - 09/24/19 1205    Education Details Discussed self trigger point release and massage with tennis ball. Discussed exam results and FOTO.    Person(s) Educated Patient    Methods Explanation;Demonstration;Verbal cues;Tactile cues;Handout    Comprehension Verbalized understanding;Returned demonstration;Verbal cues required;Tactile cues required;Need further instruction            PT Short Term Goals - 09/24/19 1205      PT SHORT TERM GOAL #1   Title Pt will be independent with initial HEP    Baseline Newly provided    Time 4    Period Weeks    Status New    Target Date 10/22/19      PT SHORT TERM GOAL #2   Title Pt will be able to perform full shoulder ROM without pain    Baseline Pain in flexion and abduction to ~50 to 60 deg    Time 4    Period Weeks    Status New    Target Date 10/22/19      PT SHORT TERM GOAL #3   Title Pt will be able to self correct posture without PT cueing    Baseline Requires cueing to decrease rounded shoulder    Time 4    Period Weeks    Status New    Target Date 10/22/19             PT Long Term Goals - 09/24/19 1207      PT LONG TERM GOAL #1   Title Pt will be independent with advanced HEP    Time 8    Period Weeks    Status New    Target Date 10/22/19  PT LONG TERM GOAL #2   Title Pt will improve shoulder strength to be able to lift at least 5#s overhead    Baseline Unable    Time 8    Period Weeks    Status New    Target Date 11/19/19      PT LONG TERM GOAL #3   Title Pt will be able to  carry at least 10 lbs without pain    Baseline Unable    Time 8    Period Weeks    Status New    Target Date 11/19/19      PT LONG TERM GOAL #4   Title Pt will be able to dress herself without pain    Time 8    Period Weeks    Status New    Target Date 11/19/19      PT LONG TERM GOAL #5   Title Pt will improve FOTO score to at least 39%    Baseline FOTO with 62% limitation    Time 8    Period Weeks    Status New    Target Date 11/19/19                  Plan - 09/24/19 4970    Clinical Impression Statement Pt is a 69 y/o F presenting to clinic S/p right shoulder scope biceps tenotomy on 7/14. Pt with history of multiple surgeries this past year including back and R hip surgery; pt to get L wrist surgery for advanced carpal tunnel later this year. Pt demonstrates limited shoulder ROM, UE strength, pain, guarding, and muscle tightness in upper traps, pecs and rhomboids affecting pt's ability to perform overhead activities and lifting. Pt would benefit from therapy to address these deficits and return her to her optimal level of function.    Personal Factors and Comorbidities Age;Comorbidity 1;Comorbidity 2;Time since onset of injury/illness/exacerbation;Comorbidity 3+    Comorbidities arthritis, HTN, back pain    Examination-Activity Limitations Reach Overhead;Caring for Others;Carry;Dressing;Lift    Examination-Participation Restrictions Community Activity;Cleaning;Occupation;Interpersonal Relationship;Meal Prep    Stability/Clinical Decision Making Evolving/Moderate complexity    Clinical Decision Making Moderate    Rehab Potential Good    PT Frequency 2x / week    PT Duration 8 weeks    PT Treatment/Interventions ADLs/Self Care Home Management;Cryotherapy;Electrical Stimulation;Iontophoresis 4mg /ml Dexamethasone;Moist Heat;Ultrasound;Functional mobility training;Therapeutic activities;Therapeutic exercise;Neuromuscular re-education;Patient/family education;Manual  techniques;Passive range of motion;Dry needling;Taping;Vasopneumatic Device    PT Next Visit Plan Assess response to HEP. Continue AAROM and AROM for shoulder as able. Progress pt's scapular strengthening and mobility. Manual therapy if indicated. Check thoracic spine.    PT Home Exercise Plan Access Code [email or web address]    [email or web address] and Agree with Plan of Care Patient           Patient will benefit from skilled therapeutic intervention in order to improve the following deficits and impairments:  Decreased coordination, Decreased range of motion, Increased fascial restricitons, Impaired UE functional use, Impaired perceived functional ability, Pain, Improper body mechanics, Decreased mobility, Decreased strength, Postural dysfunction  Visit Diagnosis: Right shoulder pain, unspecified chronicity  Stiffness of right shoulder, not elsewhere classified     Problem List Patient Active Problem List   Diagnosis Date Noted  . Nontraumatic incomplete tear of right rotator cuff 07/27/2019  . Tendinopathy of right biceps tendon 07/27/2019  . Breast cancer screening by mammogram 06/07/2019  . Seasonal allergic rhinitis due to pollen 06/07/2019  . Colon cancer screening 06/07/2019  . Spinal stenosis, lumbar region, with  neurogenic claudication 03/16/2019    Class: Chronic  . Status post lumbar laminectomy 03/16/2019  . Acute stress reaction 09/25/2018  . Primary osteoarthritis of right hip 04/27/2018  . Status post total hip replacement, 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  . Shortness of breath 01/02/2018  . Chalazion left upper eyelid 02/20/2017  . Vitamin D insufficiency 11/25/2013  . Healthcare maintenance 11/25/2013  . Spinal stenosis of lumbar region 07/02/2013  . Arthritis, degenerative 11/17/2012  . Arthrosis of right acromioclavicular joint 06/05/2012  . Rectocele 04/28/2012  . Left knee DJD, degenerative meniscus tear 04/06/2012  . Numbness and tingling in hands 03/17/2012  . Periodontitis 09/23/2011    . Depression 09/23/2011  . Right knee meniscal tear 03/14/2011  . Lumbar radiculopathy 03/14/2011  . Stress incontinence of urine 10/18/2010  . NECK PAIN, CHRONIC 08/19/2008  . Normocytic anemia 05/12/2008  . SHOULDER PAIN, RIGHT 04/28/2008  . Hyperlipemia 06/18/2007  . Essential hypertension 05/20/2007    [facility] April Ma L Jeweldean Drohan PT, DPT 09/24/2019, 12:13 PM  [facility] [address] [place], [place], [address] Phone: [phone]   Fax:  [phone]  Name: Olivia Ewing MRN: [id number] Date of Birth: 1950/03/12

## 2019-09-27 ENCOUNTER — Encounter: Payer: Self-pay | Admitting: Orthopaedic Surgery

## 2019-09-28 NOTE — Telephone Encounter (Signed)
Hard to tell if coming from hip, but could very well be coming from back.  Happy to have her come in to be evaluated

## 2019-09-29 ENCOUNTER — Ambulatory Visit (INDEPENDENT_AMBULATORY_CARE_PROVIDER_SITE_OTHER): Payer: Medicare HMO | Admitting: Orthopaedic Surgery

## 2019-09-29 ENCOUNTER — Encounter: Payer: Self-pay | Admitting: Physician Assistant

## 2019-09-29 ENCOUNTER — Other Ambulatory Visit: Payer: Self-pay | Admitting: Surgical

## 2019-09-29 DIAGNOSIS — M5416 Radiculopathy, lumbar region: Secondary | ICD-10-CM

## 2019-09-29 DIAGNOSIS — Z9889 Other specified postprocedural states: Secondary | ICD-10-CM

## 2019-09-29 MED ORDER — METHOCARBAMOL 500 MG PO TABS: 500.0000 mg | ORAL_TABLET | Freq: Two times a day (BID) | ORAL | 0 refills | Status: DC | PRN

## 2019-09-29 MED ORDER — PREDNISONE 10 MG (21) PO TBPK: ORAL_TABLET | ORAL | 0 refills | Status: DC

## 2019-09-29 MED ORDER — HYDROCODONE-ACETAMINOPHEN 7.5-325 MG PO TABS: 1.0000 | ORAL_TABLET | Freq: Three times a day (TID) | ORAL | 0 refills | Status: DC | PRN

## 2019-09-29 NOTE — Progress Notes (Signed)
Office Visit Note   Patient: Olivia Ewing           Date of Birth: [birth year]           MRN: [id number] Visit Date: 09/29/2019              Requested by: [name], MD [address] [place],  [place] [address] PCP: [name], MD   Assessment & Plan: Visit Diagnoses:  1. S/P arthroscopy of right shoulder   2. Radiculopathy, lumbar region     Plan: Impression is #1 status post right shoulder arthroscopic debridement and #2 right lower extremity lumbar radiculopathy.  In regards to the shoulder, she will decrease activity at home as I think she is overdone things.  She will continue with physical therapy.  She will follow up with [name] in 7 weeks time for repeat evaluation of the right shoulder.  In regards to the right leg, we have started her on a steroid taper and muscle relaxer.  We will add modalities and strengthening activities for her back to her current physical therapy regimen.  Follow-Up Instructions: Return in about 7 weeks (around 11/17/2019).   Orders:  No orders of the defined types were placed in this encounter.  Meds ordered this encounter  Medications  . methocarbamol (ROBAXIN) 500 MG tablet    Sig: Take 1 tablet (500 mg total) by mouth 2 (two) times daily as needed.    Dispense:  20 tablet    Refill:  0  . predniSONE (STERAPRED UNI-PAK 21 TAB) 10 MG (21) TBPK tablet    Sig: Take as directed    Dispense:  21 tablet    Refill:  0      Procedures: No procedures performed   Clinical Data: No additional findings.   Subjective: Chief Complaint  Patient presents with  . Right Shoulder - Pain  . Right Hip - Pain  . Lower Back - Pain    HPI patient is a very pleasant 69 year old female who comes in today 5 weeks out right shoulder arthroscopic debridement decompression as well as burning to the right lateral hip.  In regards to her shoulder, she has recently started physical therapy where she is making slow but steady progress.  She admits to  moderate pain which is somewhat relieved with Norco.  She has been doing quite a lot at home for which she attributes some of her pain.  In regards to her right leg, she is having burning to the lateral aspect.  No groin or anterior thigh pain.  No pain to her lower leg or into the foot.  She does have a history of degenerative disc disease L5-S1.  She is also status post right total hip replacement.  The bowel bladder change and no saddle paresthesias.  Review of Systems as detailed in HPI.  All others reviewed and are negative.   Objective: Vital Signs: There were no vitals taken for this visit.  Physical Exam well-developed well-nourished female no acute distress.  Alert and oriented x3.  Ortho Exam examination of her right shoulder reveals approximately 50% active range of motion.  4 out of 5 strength throughout.  She is neurovascular intact distally.  Right lower extremity exam shows a markedly positive straight leg raise.  Negative logroll negative FADIR.  She is neurovascular intact distally.  Specialty Comments:  No specialty comments available.  Imaging: No new imaging   PMFS History: Patient Active Problem List   Diagnosis Date Noted  .  Nontraumatic incomplete tear of right rotator cuff 07/27/2019  . Tendinopathy of right biceps tendon 07/27/2019  . Breast cancer screening by mammogram 06/07/2019  . Seasonal allergic rhinitis due to pollen 06/07/2019  . Colon cancer screening 06/07/2019  . Spinal stenosis, lumbar region, with neurogenic claudication 03/16/2019    Class: Chronic  . Status post lumbar laminectomy 03/16/2019  . Acute stress reaction 09/25/2018  . Primary osteoarthritis of right hip 04/27/2018  . Status post total hip replacement, 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  . Shortness of breath 01/02/2018  . Chalazion left upper eyelid 02/20/2017  . Vitamin D insufficiency 11/25/2013  . Healthcare maintenance 11/25/2013  . Spinal stenosis of lumbar region 07/02/2013  . Arthritis,  degenerative 11/17/2012  . Arthrosis of right acromioclavicular joint 06/05/2012  . Rectocele 04/28/2012  . Left knee DJD, degenerative meniscus tear 04/06/2012  . Numbness and tingling in hands 03/17/2012  . Periodontitis 09/23/2011  . Depression 09/23/2011  . Right knee meniscal tear 03/14/2011  . Lumbar radiculopathy 03/14/2011  . Stress incontinence of urine 10/18/2010  . NECK PAIN, CHRONIC 08/19/2008  . Normocytic anemia 05/12/2008  . SHOULDER PAIN, RIGHT 04/28/2008  . Hyperlipemia 06/18/2007  . Essential hypertension 05/20/2007   Past Medical History:  Diagnosis Date  . Anemia   . Arthritis    knees hands  . Bipolar disorder ([place])   . Cervicalgia   . Depression   . Environmental allergies    cause SOB, uses inhaler for  . Environmental and seasonal allergies    uses inhaler prn  . Full dentures   . GERD (gastroesophageal reflux disease)    diet controlled - no meds  . Hyperlipidemia    diet controlled, no meds  . Hypertension   . Lumbar radiculopathy, chronic   . Right knee pain    posterior horn medial meniscal tear MRI 2013  . Spinal stenosis    getting epidural injections -last one 06/12/2015  . SVD (spontaneous vaginal delivery)    x 4    Family History  Problem Relation Age of Onset  . Hypertension Mother     Past Surgical History:  Procedure Laterality Date  . APPENDECTOMY    . Bladder tack  10/2006   cystocoele  . BREAST SURGERY Right    benign cyst  . COLONOSCOPY    . Hysterectomy other    . LUMBAR LAMINECTOMY N/A 03/16/2019   Procedure: CENTRAL LAMINECTOMIES L2-3, L3-4 AND L4-5;  Surgeon: Jessy Oto, MD;  Location: [facility];  Service: Orthopedics;  Laterality: N/A;  . TOTAL HIP ARTHROPLASTY Right 04/27/2018   Procedure: RIGHT TOTAL HIP ARTHROPLASTY ANTERIOR APPROACH;  Surgeon: Leandrew Koyanagi, MD;  Location: [facility];  Service: Orthopedics;  Laterality: Right;  . TUBAL LIGATION     Social History   Occupational History  . Not on file  Tobacco Use    . Smoking status: Former Smoker    Packs/day: 0.10    Quit date: 11/26/1983    Years since quitting: 35.8  . Smokeless tobacco: Never Used  Vaping Use  . Vaping Use: Never used  Substance and Sexual Activity  . Alcohol use: Yes    Alcohol/week: 0.0 standard drinks    Comment: rarely  . Drug use: Not Currently    Types: Marijuana    Comment: last used 2019  . Sexual activity: Not Currently    Birth control/protection: Surgical

## 2019-10-01 ENCOUNTER — Other Ambulatory Visit: Payer: Self-pay

## 2019-10-01 ENCOUNTER — Ambulatory Visit: Payer: Medicare HMO | Admitting: Physical Therapy

## 2019-10-01 DIAGNOSIS — M25611 Stiffness of right shoulder, not elsewhere classified: Secondary | ICD-10-CM

## 2019-10-01 DIAGNOSIS — M25511 Pain in right shoulder: Secondary | ICD-10-CM | POA: Diagnosis not present

## 2019-10-01 NOTE — Patient Instructions (Signed)
Access Code: [facility]: [email or web address]: 08/20/2021Prepared by: Anderson Malta PaaExercises  Seated Gentle Upper Trapezius Stretch - 1 x daily - 7 x weekly - 3 sets - 20-30 sec hold  Gentle Levator Scapulae Stretch - 1 x daily - 7 x weekly - 3 sets - 20-30 sec hold  Doorway Pec Stretch at 60 Elevation - 1 x daily - 7 x weekly - 3 sets - 20-30 sec hold  Seated Scapular Retraction - 1 x daily - 7 x weekly - 3 sets - 10 reps  Seated Shoulder Flexion Towel Slide at Table Top - 1 x daily - 7 x weekly - 3 sets - 10 reps  Seated Shoulder Abduction Towel Slide at Table Top - 1 x daily - 7 x weekly - 3 sets - 10 reps  Standing Bilateral Low Shoulder Row with Anchored Resistance - 1 x daily - 7 x weekly - 2 sets - 10 reps - 5 hold  Shoulder External Rotation with Anchored Resistance - 1 x daily - 7 x weekly - 2 sets - 10 reps - 5 hold  Standing Shoulder Internal Rotation with Anchored Resistance - 1 x daily - 7 x weekly - 2 sets - 10 reps - 5 hold  Supine Shoulder Flexion Extension AAROM with Dowel - 1 x daily - 7 x weekly - 2 sets - 10 reps - 10 hold

## 2019-10-01 NOTE — Therapy (Signed)
[place], [place], [address] Phone: [phone]   Fax:  [phone]  Physical Therapy Treatment  Patient Details  Name: Olivia Ewing MRN: [id number] Date of Birth: 1950-05-15 Referring Provider (PT): Leandrew Koyanagi, MD   Encounter Date: 10/01/2019   PT End of Session - 10/01/19 1329    Visit Number 2    Number of Visits 16    Date for PT Re-Evaluation 11/19/19    Authorization Type [employer] MCR    PT Start Time 1000    PT Stop Time 1055    PT Time Calculation (min) 55 min    Activity Tolerance Patient tolerated treatment well    Behavior During Therapy [facility] for tasks assessed/performed           Past Medical History:  Diagnosis Date  . Anemia   . Arthritis    knees hands  . Bipolar disorder ([place])   . Cervicalgia   . Depression   . Environmental allergies    cause SOB, uses inhaler for  . Environmental and seasonal allergies    uses inhaler prn  . Full dentures   . GERD (gastroesophageal reflux disease)    diet controlled - no meds  . Hyperlipidemia    diet controlled, no meds  . Hypertension   . Lumbar radiculopathy, chronic   . Right knee pain    posterior horn medial meniscal tear MRI 2013  . Spinal stenosis    getting epidural injections -last one 06/12/2015  . SVD (spontaneous vaginal delivery)    x 4    Past Surgical History:  Procedure Laterality Date  . APPENDECTOMY    . Bladder tack  10/2006   cystocoele  . BREAST SURGERY Right    benign cyst  . COLONOSCOPY    . Hysterectomy other    . LUMBAR LAMINECTOMY N/A 03/16/2019   Procedure: CENTRAL LAMINECTOMIES L2-3, L3-4 AND L4-5;  Surgeon: Jessy Oto, MD;  Location: [place];  Service: Orthopedics;  Laterality: N/A;  . TOTAL HIP ARTHROPLASTY Right 04/27/2018   Procedure: RIGHT TOTAL HIP ARTHROPLASTY ANTERIOR APPROACH;  Surgeon: Leandrew Koyanagi, MD;  Location: [place];  Service: Orthopedics;  Laterality: Right;  . TUBAL LIGATION       There were no vitals filed for this visit.   Subjective Assessment - 10/01/19 1011    Subjective About 4-5 weeks out.  She has pain 7/10 in Rt shoulder .  Dr. gave her a new referral for her back but she did not have it.    Currently in Pain? Yes    Pain Score 7     Pain Location Shoulder    Pain Orientation Right;Anterior    Pain Type Surgical pain    Pain Onset More than a month ago    Pain Frequency Intermittent    Aggravating Factors  Sleeping , using arm    Pain Relieving Factors heating pad, pillows             [facility] Adult PT Treatment/Exercise - 10/01/19 0001      Shoulder Exercises: Supine   External Rotation AAROM    External Rotation Limitations cane x 10     Other Supine Exercises supine cane overhead press  x10, chest press x 10       Shoulder Exercises: Seated   Retraction Weight (lbs) x 10       Shoulder Exercises: Standing   External Rotation Strengthening;Right;15 reps    Theraband  Level (Shoulder External Rotation) Level 2 (Red)    Internal Rotation Strengthening;Right;15 reps    Theraband Level (Shoulder Internal Rotation) Level 2 (Red)    Row Strengthening;Both;20 reps    Theraband Level (Shoulder Row) Level 2 (Red)      Shoulder Exercises: Pulleys   Flexion 2 minutes    Scaption 2 minutes      Shoulder Exercises: Stretch   Table Stretch - Flexion 10 seconds    Table Stretch -Flexion Limitations x 10     Table Stretch - Abduction 10 seconds    Table Stretch - ABduction Limitations x 10       Manual Therapy   Manual Therapy Passive ROM    Passive ROM all planes to tolerance, limited by pain not stiffness       Neck Exercises: Stretches   Upper Trapezius Stretch 2 reps;30 seconds           MHP 10 min rt shoulder seated post session.        PT Education - 10/01/19 1157    Education Details --    Person(s) Educated Patient    Methods Explanation;Demonstration;Handout    Comprehension Verbalized understanding;Need further  instruction;Returned demonstration            PT Short Term Goals - 09/24/19 1205      PT SHORT TERM GOAL #1   Title Pt will be independent with initial HEP    Baseline Newly provided    Time 4    Period Weeks    Status New    Target Date 10/22/19      PT SHORT TERM GOAL #2   Title Pt will be able to perform full shoulder ROM without pain    Baseline Pain in flexion and abduction to ~50 to 60 deg    Time 4    Period Weeks    Status New    Target Date 10/22/19      PT SHORT TERM GOAL #3   Title Pt will be able to self correct posture without PT cueing    Baseline Requires cueing to decrease rounded shoulder    Time 4    Period Weeks    Status New    Target Date 10/22/19             PT Long Term Goals - 09/24/19 1207      PT LONG TERM GOAL #1   Title Pt will be independent with advanced HEP    Time 8    Period Weeks    Status New    Target Date 10/22/19      PT LONG TERM GOAL #2   Title Pt will improve shoulder strength to be able to lift at least 5#s overhead    Baseline Unable    Time 8    Period Weeks    Status New    Target Date 11/19/19      PT LONG TERM GOAL #3   Title Pt will be able to carry at least 10 lbs without pain    Baseline Unable    Time 8    Period Weeks    Status New    Target Date 11/19/19      PT LONG TERM GOAL #4   Title Pt will be able to dress herself without pain    Time 8    Period Weeks    Status New    Target Date 11/19/19      PT LONG  TERM GOAL #5   Title Pt will improve FOTO score to at least 39%    Baseline FOTO with 62% limitation    Time 8    Period Weeks    Status New    Target Date 11/19/19                 Plan - 10/01/19 1008    Clinical Impression Statement Patient tolerated PROM, AAROM well.  She needs cues to relax neck with exercises.  Advanced her HEP to include light bands per her request. She has good PROM, min pain increase with red band IR . Appreciative of MHP post session.highly  motivated and given band for light resistance.    PT Treatment/Interventions ADLs/Self Care Home Management;Cryotherapy;Electrical Stimulation;Iontophoresis 4mg /ml Dexamethasone;Moist Heat;Ultrasound;Functional mobility training;Therapeutic activities;Therapeutic exercise;Neuromuscular re-education;Patient/family education;Manual techniques;Passive range of motion;Dry needling;Taping;Vasopneumatic Device    PT Next Visit Plan Assess response to HEP. Continue AAROM and AROM for shoulder as able. Progress pt's scapular strengthening and mobility. Manual therapy if indicated. Check thoracic spine.    PT Home Exercise Plan [facility]    Consulted and Agree with Plan of Care Patient           Patient will benefit from skilled therapeutic intervention in order to improve the following deficits and impairments:  Decreased coordination, Decreased range of motion, Increased fascial restricitons, Impaired UE functional use, Impaired perceived functional ability, Pain, Improper body mechanics, Decreased mobility, Decreased strength, Postural dysfunction  Visit Diagnosis: Right shoulder pain, unspecified chronicity  Stiffness of right shoulder, not elsewhere classified     Problem List Patient Active Problem List   Diagnosis Date Noted  . Nontraumatic incomplete tear of right rotator cuff 07/27/2019  . Tendinopathy of right biceps tendon 07/27/2019  . Breast cancer screening by mammogram 06/07/2019  . Seasonal allergic rhinitis due to pollen 06/07/2019  . Colon cancer screening 06/07/2019  . Spinal stenosis, lumbar region, with neurogenic claudication 03/16/2019    Class: Chronic  . Status post lumbar laminectomy 03/16/2019  . Acute stress reaction 09/25/2018  . Primary osteoarthritis of right hip 04/27/2018  . Status post total hip replacement, 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  . Shortness of breath 01/02/2018  . Chalazion left upper eyelid 02/20/2017  . Vitamin D insufficiency 11/25/2013  . Healthcare  maintenance 11/25/2013  . Spinal stenosis of lumbar region 07/02/2013  . Arthritis, degenerative 11/17/2012  . Arthrosis of right acromioclavicular joint 06/05/2012  . Rectocele 04/28/2012  . Left knee DJD, degenerative meniscus tear 04/06/2012  . Numbness and tingling in hands 03/17/2012  . Periodontitis 09/23/2011  . Depression 09/23/2011  . Right knee meniscal tear 03/14/2011  . Lumbar radiculopathy 03/14/2011  . Stress incontinence of urine 10/18/2010  . NECK PAIN, CHRONIC 08/19/2008  . Normocytic anemia 05/12/2008  . SHOULDER PAIN, RIGHT 04/28/2008  . Hyperlipemia 06/18/2007  . Essential hypertension 05/20/2007    Kwesi Sangha 10/01/2019, 1:33 PM  [facility] [address] [place], [place], [address] Phone: [phone]   Fax:  [phone]  Name: Olivia Ewing MRN: [id number] Date of Birth: 01/23/1951  Raeford Razor, PT 10/01/19 1:34 PM Phone: [phone] Fax: [phone]

## 2019-10-05 ENCOUNTER — Other Ambulatory Visit: Payer: Self-pay

## 2019-10-05 ENCOUNTER — Ambulatory Visit (INDEPENDENT_AMBULATORY_CARE_PROVIDER_SITE_OTHER): Payer: Medicare HMO | Admitting: Licensed Clinical Social Worker

## 2019-10-05 DIAGNOSIS — F419 Anxiety disorder, unspecified: Secondary | ICD-10-CM

## 2019-10-05 DIAGNOSIS — F32A Depression, unspecified: Secondary | ICD-10-CM

## 2019-10-05 DIAGNOSIS — F329 Major depressive disorder, single episode, unspecified: Secondary | ICD-10-CM

## 2019-10-05 NOTE — BH Specialist Note (Signed)
Integrated Behavioral Health Follow Up Visit  MRN: [id number] Name: Olivia Ewing  Session Start time: 10:45  Session End time: 11;05 Total time: 20 minutes  Type of Service: Integrated Behavioral Health- Individual Interpretor:No.  SUBJECTIVE: Olivia Ewing is a 69 y.o. female whom attended the session individually. Patient was referred by Dr. Wynetta Emery for stress. Patient reports the following symptoms/concerns:  panic, acute stress that has transitioned to anxiety, history of trauma, mood instability, grief, anger, sleep challenges, and financial issues.Interpersonal challenges and depression.  Duration of problem: since July 2020; Severity of problem: moderate  OBJECTIVE: Mood: Anxious and Affect: Constricted Risk of harm to self or others: No plan to harm self or others  LIFE CONTEXT: Family and Social: Patient reported limited contact with her daughter, and processed how this has caused her sadness, worry, and anger.  School/Work: Unable to work.  GOALS ADDRESSED: Patient will: 1.  Reduce symptoms of: agitation, anxiety, depression, mood instability and stress  2.  Increase knowledge and/or ability of: coping skills, healthy habits and stress reduction  3.  Demonstrate ability to: Increase healthy adjustment to current life circumstances and Increase adequate support systems for patient/family  INTERVENTIONS: Interventions utilized:  Mindfulness or Relaxation Training, Brief CBT and Supportive Counseling Standardized Assessments completed: assessed for SI, HI, and self-harm.  ASSESSMENT: Patient currently experiencing moderate levels of anxiety and depression. Patient reported an increase in irritability, but she is not lashing out on others. Patient reported that she is "angry" with the fact that her daughter will not spend time with her, and is continuing to hold past events against her. Patient wants to spend quality time with her daughter and grandchildren.    Patient may benefit from counseling and medication management services.  PLAN: 1. Follow up with behavioral health clinician on : two to three weeks.  Dessie Coma, [facility], [place]

## 2019-10-06 ENCOUNTER — Encounter: Payer: Self-pay | Admitting: Licensed Clinical Social Worker

## 2019-10-07 ENCOUNTER — Encounter: Payer: Self-pay | Admitting: Specialist

## 2019-10-08 ENCOUNTER — Encounter: Payer: Self-pay | Admitting: Physical Therapy

## 2019-10-08 ENCOUNTER — Ambulatory Visit: Payer: Medicare HMO | Admitting: Physical Therapy

## 2019-10-12 ENCOUNTER — Ambulatory Visit: Payer: Medicare HMO | Admitting: Physical Therapy

## 2019-10-13 ENCOUNTER — Encounter: Payer: Medicare HMO | Admitting: Licensed Clinical Social Worker

## 2019-10-14 ENCOUNTER — Encounter (HOSPITAL_BASED_OUTPATIENT_CLINIC_OR_DEPARTMENT_OTHER): Payer: Self-pay | Admitting: Specialist

## 2019-10-14 ENCOUNTER — Ambulatory Visit (INDEPENDENT_AMBULATORY_CARE_PROVIDER_SITE_OTHER): Payer: Medicare HMO | Admitting: Surgery

## 2019-10-14 ENCOUNTER — Other Ambulatory Visit: Payer: Self-pay

## 2019-10-14 ENCOUNTER — Encounter: Payer: Self-pay | Admitting: Surgery

## 2019-10-14 VITALS — BP 132/87 | HR 75 | Temp 98.8°F

## 2019-10-14 DIAGNOSIS — G5602 Carpal tunnel syndrome, left upper limb: Secondary | ICD-10-CM

## 2019-10-15 ENCOUNTER — Ambulatory Visit: Payer: Medicare HMO | Admitting: Physical Therapy

## 2019-10-15 ENCOUNTER — Other Ambulatory Visit: Payer: Medicare HMO

## 2019-10-15 ENCOUNTER — Inpatient Hospital Stay (HOSPITAL_COMMUNITY): Admission: RE | Admit: 2019-10-15 | Payer: Medicare HMO | Source: Ambulatory Visit

## 2019-10-15 ENCOUNTER — Ambulatory Visit: Payer: Medicare HMO

## 2019-10-18 ENCOUNTER — Encounter: Payer: Self-pay | Admitting: Specialist

## 2019-10-19 ENCOUNTER — Telehealth: Payer: Self-pay | Admitting: *Deleted

## 2019-10-19 ENCOUNTER — Ambulatory Visit: Payer: Medicare HMO | Admitting: Physical Therapy

## 2019-10-19 NOTE — Progress Notes (Signed)
69 year old black female history of left carpal tunnel syndrome comes in for preop evaluation.  Symptoms unchanged from previous visit.  She is wanting to proceed with left carpal tunnel release as scheduled.  Today history physical performed.  Review of systems negative.  Surgical procedure discussed along with potential rehab.  All questions answered

## 2019-10-19 NOTE — Telephone Encounter (Signed)
Unsure if pt is requesting a refill on Flonase or another med; called pt, no answer, mailbox full - unable to leave a message.

## 2019-10-19 NOTE — Telephone Encounter (Signed)
Seen, will wait for patients call to insure we are prescribing the correct medication.

## 2019-10-19 NOTE — Telephone Encounter (Signed)
Patient requesting nasal spray that Dr. Shan Levans prescribed called into [facility].

## 2019-10-20 ENCOUNTER — Other Ambulatory Visit: Payer: Self-pay | Admitting: Specialist

## 2019-10-20 ENCOUNTER — Other Ambulatory Visit: Payer: Self-pay

## 2019-10-20 ENCOUNTER — Encounter (HOSPITAL_BASED_OUTPATIENT_CLINIC_OR_DEPARTMENT_OTHER): Payer: Self-pay | Admitting: Specialist

## 2019-10-20 MED ORDER — HYDROCODONE-ACETAMINOPHEN 7.5-325 MG PO TABS
1.0000 | ORAL_TABLET | Freq: Four times a day (QID) | ORAL | 0 refills | Status: DC | PRN
Start: 2019-10-20 — End: 2019-10-25

## 2019-10-21 ENCOUNTER — Encounter (HOSPITAL_BASED_OUTPATIENT_CLINIC_OR_DEPARTMENT_OTHER)
Admission: RE | Admit: 2019-10-21 | Discharge: 2019-10-21 | Disposition: A | Discharge status: Home / Self Care | Payer: Medicare HMO | Source: Ambulatory Visit | Attending: Specialist | Admitting: Specialist

## 2019-10-21 ENCOUNTER — Other Ambulatory Visit (HOSPITAL_COMMUNITY)
Admission: RE | Admit: 2019-10-21 | Discharge: 2019-10-21 | Disposition: A | Discharge status: Home / Self Care | Payer: Medicare HMO | Source: Ambulatory Visit | Attending: Specialist | Admitting: Specialist

## 2019-10-21 ENCOUNTER — Ambulatory Visit: Payer: Medicare HMO | Admitting: Specialist

## 2019-10-21 DIAGNOSIS — Z20822 Contact with and (suspected) exposure to covid-19: Secondary | ICD-10-CM | POA: Insufficient documentation

## 2019-10-21 DIAGNOSIS — Z01812 Encounter for preprocedural laboratory examination: Secondary | ICD-10-CM | POA: Insufficient documentation

## 2019-10-21 LAB — COMPREHENSIVE METABOLIC PANEL
ALT: 20 U/L (ref 0–44)
AST: 24 U/L (ref 15–41)
Albumin: 3.4 g/dL — ABNORMAL LOW (ref 3.5–5.0)
Alkaline Phosphatase: 81 U/L (ref 38–126)
Anion gap: 9 (ref 5–15)
BUN: 5 mg/dL — ABNORMAL LOW (ref 8–23)
CO2: 26 mmol/L (ref 22–32)
Calcium: 9.3 mg/dL (ref 8.9–10.3)
Chloride: 104 mmol/L (ref 98–111)
Creatinine, Ser: 0.72 mg/dL (ref 0.44–1.00)
GFR calc Af Amer: >60 mL/min (ref >60)
GFR calc non Af Amer: >60 mL/min (ref >60)
Glucose, Bld: 94 mg/dL (ref 70–99)
Potassium: 4.3 mmol/L (ref 3.5–5.1)
Sodium: 139 mmol/L (ref 135–145)
Total Bilirubin: 0.3 mg/dL (ref 0.3–1.2)
Total Protein: 6.7 g/dL (ref 6.5–8.1)

## 2019-10-21 LAB — CBC
HCT: 32.3 % — ABNORMAL LOW (ref 36.0–46.0)
Hemoglobin: 9.4 g/dL — ABNORMAL LOW (ref 12.0–15.0)
MCH: 23.9 pg — ABNORMAL LOW (ref 26.0–34.0)
MCHC: 29.1 g/dL — ABNORMAL LOW (ref 30.0–36.0)
MCV: 82.2 fL (ref 80.0–100.0)
Platelets: 354 10*3/uL (ref 150–400)
RBC: 3.93 MIL/uL (ref 3.87–5.11)
RDW: 15.9 % — ABNORMAL HIGH (ref 11.5–15.5)
WBC: 5.5 10*3/uL (ref 4.0–10.5)
nRBC: 0 % (ref 0.0–0.2)

## 2019-10-21 LAB — SARS CORONAVIRUS 2 (TAT 6-24 HRS): SARS Coronavirus 2: NEGATIVE

## 2019-10-21 NOTE — Progress Notes (Signed)

## 2019-10-22 ENCOUNTER — Ambulatory Visit: Payer: Medicare HMO | Admitting: Physical Therapy

## 2019-10-22 ENCOUNTER — Ambulatory Visit: Payer: Medicare HMO

## 2019-10-22 ENCOUNTER — Encounter: Payer: Medicare HMO | Admitting: Student

## 2019-10-24 ENCOUNTER — Encounter: Payer: Self-pay | Admitting: Orthopaedic Surgery

## 2019-10-24 NOTE — H&P (Signed)
Olivia Ewing is an 69 y.o. female.   Chief Complaint: left hand pain, numbness and tingling HPI: 69 year old black female history of left carpal tunnel syndrome comes in for preop evaluation.  Symptoms unchanged from previous visit.  She is wanting to proceed with left carpal tunnel release as scheduled.  Past Medical History:  Diagnosis Date  . Anemia   . Arthritis    knees hands  . Bipolar disorder ([place])   . Cervicalgia   . Depression   . Environmental allergies    cause SOB, uses inhaler for  . Environmental and seasonal allergies    uses inhaler prn  . Full dentures   . GERD (gastroesophageal reflux disease)    diet controlled - no meds  . Hyperlipidemia    diet controlled, no meds  . Hypertension   . Lumbar radiculopathy, chronic   . Right knee pain    posterior horn medial meniscal tear MRI 2013  . Spinal stenosis    getting epidural injections -last one 06/12/2015  . SVD (spontaneous vaginal delivery)    x 4    Past Surgical History:  Procedure Laterality Date  . APPENDECTOMY    . Bladder tack  10/2006   cystocoele  . BREAST SURGERY Right    benign cyst  . COLONOSCOPY    . Hysterectomy other    . LUMBAR LAMINECTOMY N/A 03/16/2019   Procedure: CENTRAL LAMINECTOMIES L2-3, L3-4 AND L4-5;  Surgeon: Jessy Oto, MD;  Location: [place];  Service: Orthopedics;  Laterality: N/A;  . TOTAL HIP ARTHROPLASTY Right 04/27/2018   Procedure: RIGHT TOTAL HIP ARTHROPLASTY ANTERIOR APPROACH;  Surgeon: Leandrew Koyanagi, MD;  Location: [facility];  Service: Orthopedics;  Laterality: Right;  . TUBAL LIGATION      Family History  Problem Relation Age of Onset  . Hypertension Mother    Social History:  reports that she quit smoking about 35 years ago. She smoked 0.10 packs per day. She has never used smokeless tobacco. She reports current alcohol use. She reports previous drug use. Drug: Marijuana.  Allergies:  Allergies  Allergen Reactions  . Tramadol     Hallucinate     No  medications prior to admission.    No results found for this or any previous visit (from the past 48 hour(s)). No results found.  Review of Systems  Constitutional: Negative.   HENT: Negative.   Respiratory: Negative.   Cardiovascular: Negative.   Gastrointestinal: Negative.   Genitourinary: Negative.   Musculoskeletal: Negative.   Neurological: Positive for numbness.  Psychiatric/Behavioral: Negative.     Height 5\' 5"  (1.651 m), weight 83.9 kg. Physical Exam HENT:     Head: Normocephalic and atraumatic.     Nose: Nose normal.  Cardiovascular:     Rate and Rhythm: Regular rhythm.  Pulmonary:     Effort: Pulmonary effort is normal. No respiratory distress.     Breath sounds: Normal breath sounds.  Abdominal:     General: There is no distension.  Musculoskeletal:        General: Tenderness present.     Comments: Positive tinel's.    Neurological:     Mental Status: She is alert and oriented to person, place, and time.  Psychiatric:        Mood and Affect: Mood normal.      Assessment/Plan Left carpal tunnel syndrome  Will proceed with left carpal tunnel release as scheduled.  Surgical procedure discussed.  All questions answered.    Jeneen Rinks  Ricard Dillon, PA-C 10/24/2019, 12:01 PM

## 2019-10-25 ENCOUNTER — Encounter (HOSPITAL_BASED_OUTPATIENT_CLINIC_OR_DEPARTMENT_OTHER)
Admission: RE | Disposition: A | Discharge status: Home / Self Care | Payer: Self-pay | Source: Home / Self Care | Attending: Specialist

## 2019-10-25 ENCOUNTER — Ambulatory Visit (HOSPITAL_BASED_OUTPATIENT_CLINIC_OR_DEPARTMENT_OTHER): Payer: Medicare HMO | Admitting: Anesthesiology

## 2019-10-25 ENCOUNTER — Encounter: Payer: Self-pay | Admitting: Specialist

## 2019-10-25 ENCOUNTER — Encounter (HOSPITAL_BASED_OUTPATIENT_CLINIC_OR_DEPARTMENT_OTHER): Payer: Self-pay | Admitting: Specialist

## 2019-10-25 ENCOUNTER — Ambulatory Visit (HOSPITAL_BASED_OUTPATIENT_CLINIC_OR_DEPARTMENT_OTHER)
Admission: RE | Admit: 2019-10-25 | Discharge: 2019-10-25 | Disposition: A | Discharge status: Home / Self Care | Payer: Medicare HMO | Attending: Specialist | Admitting: Specialist

## 2019-10-25 ENCOUNTER — Other Ambulatory Visit: Payer: Self-pay

## 2019-10-25 DIAGNOSIS — M1990 Unspecified osteoarthritis, unspecified site: Secondary | ICD-10-CM | POA: Diagnosis not present

## 2019-10-25 DIAGNOSIS — I10 Essential (primary) hypertension: Secondary | ICD-10-CM | POA: Insufficient documentation

## 2019-10-25 DIAGNOSIS — G5602 Carpal tunnel syndrome, left upper limb: Secondary | ICD-10-CM | POA: Diagnosis present

## 2019-10-25 DIAGNOSIS — F17210 Nicotine dependence, cigarettes, uncomplicated: Secondary | ICD-10-CM | POA: Insufficient documentation

## 2019-10-25 DIAGNOSIS — G5601 Carpal tunnel syndrome, right upper limb: Secondary | ICD-10-CM | POA: Diagnosis present

## 2019-10-25 HISTORY — PX: CARPAL TUNNEL RELEASE: SHX101

## 2019-10-25 SURGERY — CARPAL TUNNEL RELEASE
Anesthesia: General | Site: Hand | Laterality: Left

## 2019-10-25 MED ORDER — FENTANYL CITRATE (PF) 100 MCG/2ML IJ SOLN
INTRAMUSCULAR | Status: AC
  Filled 2019-10-25: qty 2

## 2019-10-25 MED ORDER — PHENYLEPHRINE 40 MCG/ML (10ML) SYRINGE FOR IV PUSH (FOR BLOOD PRESSURE SUPPORT)
PREFILLED_SYRINGE | INTRAVENOUS | Status: AC
  Filled 2019-10-25: qty 10

## 2019-10-25 MED ORDER — PROMETHAZINE HCL 25 MG/ML IJ SOLN: 6.2500 mg | INTRAMUSCULAR | Status: DC | PRN

## 2019-10-25 MED ORDER — MEPERIDINE HCL 25 MG/ML IJ SOLN: 6.2500 mg | INTRAMUSCULAR | Status: DC | PRN

## 2019-10-25 MED ORDER — AMISULPRIDE (ANTIEMETIC) 5 MG/2ML IV SOLN: 10.0000 mg | Freq: Once | INTRAVENOUS | Status: DC | PRN

## 2019-10-25 MED ORDER — EPHEDRINE SULFATE 50 MG/ML IJ SOLN
INTRAMUSCULAR | Status: DC | PRN
  Administered 2019-10-25 (×2): 10 mg via INTRAVENOUS

## 2019-10-25 MED ORDER — LIDOCAINE HCL (PF) 1 % IJ SOLN
INTRAMUSCULAR | Status: AC
  Filled 2019-10-25: qty 30

## 2019-10-25 MED ORDER — HYDROMORPHONE HCL 1 MG/ML IJ SOLN: 0.2500 mg | INTRAMUSCULAR | Status: DC | PRN

## 2019-10-25 MED ORDER — DEXAMETHASONE SODIUM PHOSPHATE 10 MG/ML IJ SOLN
INTRAMUSCULAR | Status: DC | PRN
  Administered 2019-10-25: 10 mg via INTRAVENOUS

## 2019-10-25 MED ORDER — DEXAMETHASONE SODIUM PHOSPHATE 10 MG/ML IJ SOLN
INTRAMUSCULAR | Status: AC
  Filled 2019-10-25: qty 1

## 2019-10-25 MED ORDER — SUCCINYLCHOLINE CHLORIDE 200 MG/10ML IV SOSY
PREFILLED_SYRINGE | INTRAVENOUS | Status: AC
  Filled 2019-10-25: qty 10

## 2019-10-25 MED ORDER — EPHEDRINE 5 MG/ML INJ
INTRAVENOUS | Status: AC
  Filled 2019-10-25: qty 10

## 2019-10-25 MED ORDER — LIDOCAINE HCL (CARDIAC) PF 100 MG/5ML IV SOSY
PREFILLED_SYRINGE | INTRAVENOUS | Status: DC | PRN
  Administered 2019-10-25: 80 mg via INTRAVENOUS

## 2019-10-25 MED ORDER — MIDAZOLAM HCL 5 MG/5ML IJ SOLN
INTRAMUSCULAR | Status: DC | PRN
  Administered 2019-10-25: 1 mg via INTRAVENOUS

## 2019-10-25 MED ORDER — MIDAZOLAM HCL 2 MG/2ML IJ SOLN
INTRAMUSCULAR | Status: AC
  Filled 2019-10-25: qty 2

## 2019-10-25 MED ORDER — HYDROCODONE-ACETAMINOPHEN 7.5-325 MG PO TABS: 1.0000 | ORAL_TABLET | Freq: Four times a day (QID) | ORAL | 0 refills | Status: DC | PRN

## 2019-10-25 MED ORDER — OXYCODONE HCL 5 MG/5ML PO SOLN: 5.0000 mg | Freq: Once | ORAL | Status: DC | PRN

## 2019-10-25 MED ORDER — BUPIVACAINE HCL (PF) 0.25 % IJ SOLN
INTRAMUSCULAR | Status: AC
  Filled 2019-10-25: qty 30

## 2019-10-25 MED ORDER — LIDOCAINE-EPINEPHRINE 1 %-1:100000 IJ SOLN
INTRAMUSCULAR | Status: AC
  Filled 2019-10-25: qty 1

## 2019-10-25 MED ORDER — FENTANYL CITRATE (PF) 100 MCG/2ML IJ SOLN
INTRAMUSCULAR | Status: DC | PRN
Start: 2019-10-25 — End: 2019-10-25
  Administered 2019-10-25: 50 ug via INTRAVENOUS

## 2019-10-25 MED ORDER — ONDANSETRON HCL 4 MG/2ML IJ SOLN
INTRAMUSCULAR | Status: DC | PRN
  Administered 2019-10-25: 4 mg via INTRAVENOUS

## 2019-10-25 MED ORDER — LIDOCAINE-EPINEPHRINE (PF) 1 %-1:200000 IJ SOLN
INTRAMUSCULAR | Status: AC
  Filled 2019-10-25: qty 30

## 2019-10-25 MED ORDER — BUPIVACAINE HCL (PF) 0.5 % IJ SOLN
INTRAMUSCULAR | Status: DC | PRN
  Administered 2019-10-25: 8 mL

## 2019-10-25 MED ORDER — ONDANSETRON HCL 4 MG/2ML IJ SOLN
INTRAMUSCULAR | Status: AC
  Filled 2019-10-25: qty 2

## 2019-10-25 MED ORDER — LACTATED RINGERS IV SOLN: INTRAVENOUS | Status: DC

## 2019-10-25 MED ORDER — CEFAZOLIN SODIUM-DEXTROSE 2-4 GM/100ML-% IV SOLN
2.0000 g | INTRAVENOUS | Status: AC
  Administered 2019-10-25: 2 g via INTRAVENOUS

## 2019-10-25 MED ORDER — CEFAZOLIN SODIUM-DEXTROSE 2-4 GM/100ML-% IV SOLN
INTRAVENOUS | Status: AC
  Filled 2019-10-25: qty 100

## 2019-10-25 MED ORDER — LIDOCAINE 2% (20 MG/ML) 5 ML SYRINGE
INTRAMUSCULAR | Status: AC
  Filled 2019-10-25: qty 5

## 2019-10-25 MED ORDER — BUPIVACAINE HCL (PF) 0.5 % IJ SOLN
INTRAMUSCULAR | Status: AC
  Filled 2019-10-25: qty 30

## 2019-10-25 MED ORDER — OXYCODONE HCL 5 MG PO TABS: 5.0000 mg | ORAL_TABLET | Freq: Once | ORAL | Status: DC | PRN

## 2019-10-25 SURGICAL SUPPLY — 55 items
ADH SKN CLS APL DERMABOND .7 (GAUZE/BANDAGES/DRESSINGS) ×1
APL SKNCLS STERI-STRIP NONHPOA (GAUZE/BANDAGES/DRESSINGS)
BAND INSRT 18 STRL LF DISP RB (MISCELLANEOUS)
BAND RUBBER #18 3X1/16 STRL (MISCELLANEOUS) IMPLANT
BENZOIN TINCTURE PRP APPL 2/3 (GAUZE/BANDAGES/DRESSINGS) IMPLANT
BLADE SURG 15 STRL LF DISP TIS (BLADE) ×1 IMPLANT
BLADE SURG 15 STRL SS (BLADE) ×3
BNDG CMPR 9X4 STRL LF SNTH (GAUZE/BANDAGES/DRESSINGS)
BNDG ELASTIC 3X5.8 VLCR STR LF (GAUZE/BANDAGES/DRESSINGS) ×3 IMPLANT
BNDG ESMARK 4X9 LF (GAUZE/BANDAGES/DRESSINGS) IMPLANT
CLOSURE WOUND 1/2 X4 (GAUZE/BANDAGES/DRESSINGS)
CORD BIPOLAR FORCEPS 12FT (ELECTRODE) ×3 IMPLANT
COVER BACK TABLE 60X90IN (DRAPES) ×3 IMPLANT
COVER MAYO STAND STRL (DRAPES) ×3 IMPLANT
COVER WAND RF STERILE (DRAPES) IMPLANT
CUFF TOURN SGL QUICK 18X4 (TOURNIQUET CUFF) ×3 IMPLANT
DERMABOND ADVANCED (GAUZE/BANDAGES/DRESSINGS) ×2
DERMABOND ADVANCED .7 DNX12 (GAUZE/BANDAGES/DRESSINGS) ×1 IMPLANT
DRAPE EXTREMITY T 121X128X90 (DISPOSABLE) ×3 IMPLANT
DRAPE SURG 17X23 STRL (DRAPES) ×3 IMPLANT
DRSG EMULSION OIL 3X3 NADH (GAUZE/BANDAGES/DRESSINGS) ×3 IMPLANT
DRSG PAD ABDOMINAL 8X10 ST (GAUZE/BANDAGES/DRESSINGS) IMPLANT
DURAPREP 26ML APPLICATOR (WOUND CARE) ×3 IMPLANT
ELECT REM PT RETURN 9FT ADLT (ELECTROSURGICAL)
ELECTRODE REM PT RTRN 9FT ADLT (ELECTROSURGICAL) IMPLANT
GAUZE SPONGE 4X4 12PLY STRL (GAUZE/BANDAGES/DRESSINGS) ×3 IMPLANT
GAUZE SPONGE 4X4 12PLY STRL LF (GAUZE/BANDAGES/DRESSINGS) IMPLANT
GLOVE BIOGEL PI IND STRL 7.0 (GLOVE) IMPLANT
GLOVE BIOGEL PI IND STRL 8 (GLOVE) ×1 IMPLANT
GLOVE BIOGEL PI IND STRL 9 (GLOVE) ×1 IMPLANT
GLOVE BIOGEL PI INDICATOR 7.0 (GLOVE) ×4
GLOVE BIOGEL PI INDICATOR 8 (GLOVE) ×2
GLOVE BIOGEL PI INDICATOR 9 (GLOVE) ×2
GLOVE ORTHO TXT STRL SZ7.5 (GLOVE) ×3 IMPLANT
GLOVE SURG SS PI 7.0 STRL IVOR (GLOVE) ×2 IMPLANT
GOWN STRL REUS W/ TWL LRG LVL3 (GOWN DISPOSABLE) ×1 IMPLANT
GOWN STRL REUS W/TWL 2XL LVL3 (GOWN DISPOSABLE) ×3 IMPLANT
GOWN STRL REUS W/TWL LRG LVL3 (GOWN DISPOSABLE) ×3
NEEDLE HYPO 22GX1.5 SAFETY (NEEDLE) IMPLANT
PACK BASIN DAY SURGERY FS (CUSTOM PROCEDURE TRAY) ×3 IMPLANT
PAD CAST 3X4 CTTN HI CHSV (CAST SUPPLIES) ×1 IMPLANT
PADDING CAST ABS 4INX4YD NS (CAST SUPPLIES) ×2
PADDING CAST ABS COTTON 4X4 ST (CAST SUPPLIES) ×1 IMPLANT
PADDING CAST COTTON 3X4 STRL (CAST SUPPLIES) ×3
PENCIL SMOKE EVACUATOR (MISCELLANEOUS) IMPLANT
SPLINT FIBERGLASS 3X35 (CAST SUPPLIES) IMPLANT
SPLINT PLASTER CAST XFAST 3X15 (CAST SUPPLIES) IMPLANT
SPLINT PLASTER XTRA FASTSET 3X (CAST SUPPLIES)
STOCKINETTE 4X48 STRL (DRAPES) ×3 IMPLANT
STRIP CLOSURE SKIN 1/2X4 (GAUZE/BANDAGES/DRESSINGS) IMPLANT
SUT ETHILON 4 0 PS 2 18 (SUTURE) ×3 IMPLANT
SUT VIC AB 3-0 FS2 27 (SUTURE) IMPLANT
SYR BULB EAR ULCER 3OZ GRN STR (SYRINGE) IMPLANT
SYR CONTROL 10ML LL (SYRINGE) IMPLANT
TOWEL GREEN STERILE FF (TOWEL DISPOSABLE) ×3 IMPLANT

## 2019-10-25 NOTE — Anesthesia Postprocedure Evaluation (Signed)
Anesthesia Post Note  Patient: Olivia Ewing  Procedure(s) Performed: LEFT CARPAL TUNNEL RELEASE (Left Hand)     Patient location during evaluation: PACU Anesthesia Type: General Level of consciousness: awake and alert Pain management: pain level controlled Vital Signs Assessment: post-procedure vital signs reviewed and stable Respiratory status: spontaneous breathing, nonlabored ventilation and respiratory function stable Cardiovascular status: blood pressure returned to baseline and stable Postop Assessment: no apparent nausea or vomiting Anesthetic complications: no   No complications documented.  Last Vitals:  Vitals:   10/25/19 0900 10/25/19 0945  BP: (!) 147/83 (!) 149/89  Pulse: 85 88  Resp: (!) 21 20  Temp:  36.7 C  SpO2: 100% 100%    Last Pain:  Vitals:   10/25/19 0945  TempSrc:   PainSc: 0-No pain                 Lynda Rainwater

## 2019-10-25 NOTE — Anesthesia Preprocedure Evaluation (Signed)
Anesthesia Evaluation  Patient identified by MRN, date of birth, ID band Patient awake    Reviewed: Allergy & Precautions, NPO status , Patient's Chart, lab work & pertinent test results  Airway Mallampati: II  TM Distance: >3 FB Neck ROM: Full    Dental  (+) Edentulous Upper, Edentulous Lower   Pulmonary former smoker,    breath sounds clear to auscultation       Cardiovascular hypertension, Pt. on medications  Rhythm:Regular Rate:Normal     Neuro/Psych Anxiety Depression Bipolar Disorder    GI/Hepatic GERD  ,  Endo/Other    Renal/GU      Musculoskeletal  (+) Arthritis , Osteoarthritis,    Abdominal (+) + obese,   Peds  Hematology  (+) anemia ,   Anesthesia Other Findings   Reproductive/Obstetrics                             Anesthesia Physical  Anesthesia Plan  ASA: III  Anesthesia Plan: General   Post-op Pain Management:    Induction: Intravenous  PONV Risk Score and Plan: 3 and Ondansetron, Dexamethasone, Midazolam and Treatment may vary due to age or medical condition  Airway Management Planned: LMA  Additional Equipment:   Intra-op Plan:   Post-operative Plan: Extubation in OR  Informed Consent: I have reviewed the patients History and Physical, chart, labs and discussed the procedure including the risks, benefits and alternatives for the proposed anesthesia with the patient or authorized representative who has indicated his/her understanding and acceptance.       Plan Discussed with: CRNA and Anesthesiologist  Anesthesia Plan Comments:         Anesthesia Quick Evaluation

## 2019-10-25 NOTE — Interval H&P Note (Signed)
History and Physical Interval Note:  10/25/2019 7:33 AM  Olivia Ewing  has presented today for surgery, with the diagnosis of severe left carpal tunnel syndrome.  The various methods of treatment have been discussed with the patient and family. After consideration of risks, benefits and other options for treatment, the patient has consented to  Procedure(s): LEFT CARPAL TUNNEL RELEASE (Left) as a surgical intervention.  The patient's history has been reviewed, patient examined, no change in status, stable for surgery.  I have reviewed the patient's chart and labs.  Questions were answered to the patient's satisfaction.     Basil Dess

## 2019-10-25 NOTE — Anesthesia Procedure Notes (Signed)
Procedure Name: LMA Insertion Date/Time: 10/25/2019 7:48 AM Performed by: Willa Frater, CRNA Pre-anesthesia Checklist: Patient identified, Emergency Drugs available, Suction available and Patient being monitored Patient Re-evaluated:Patient Re-evaluated prior to induction Oxygen Delivery Method: Circle system utilized Preoxygenation: Pre-oxygenation with 100% oxygen Induction Type: IV induction Ventilation: Mask ventilation without difficulty LMA: LMA inserted LMA Size: 4.0 Number of attempts: 1 Airway Equipment and Method: Bite block Placement Confirmation: positive ETCO2 Tube secured with: Tape Dental Injury: Teeth and Oropharynx as per pre-operative assessment

## 2019-10-25 NOTE — Op Note (Signed)
10/25/2019  8:47 AM  PATIENT:  Olivia Ewing  69 y.o. female  MRN: [id number]  PRE-OPERATIVE DIAGNOSIS:  severe left carpal tunnel syndrome  POST-OPERATIVE DIAGNOSIS:  severe left carpal tunnel syndrome  PROCEDURE:  Procedure(s): LEFT CARPAL TUNNEL RELEASE   OPERATIVE REPORT     SURGEON:  Jessy Oto, MD       ANESTHESIA:  General via OEO, Supplemented with local Marcaine 0.5 % 8cc     COMPLICATIONS:  None.      TOURNIQUET TIME: 10 minutes at 280mHg   PROCEDURE: The patient was met in the holding area, and the appropriate left wrist identified and marked  With an "x" and my initials. The patient was then transported to OR and was placed on the operative table in a supine position. The patient was then placed under general anesthesia without difficulty. The patient received appropriate preoperative antibiotic prophylaxis ancef 1 gram.     The left upper extremity was then prepped using sterile conditions and draped using sterile technique.  Time-out procedure was called and correct. The left arm elevated and exsanguinated with an Esmarch bandage. Using loope magnification and head lamp a 1.5 inch incision curved at the wrist crease with 15 blade Scalpel in line with the ring finger.  Incision through skin and subcutaneous tissue to the volar forearm fascia, the palmaris longis tendon retracted ulnarward and  transverse carpal ligament identified. The volar fascia deep to the palmaris longis tendon proximal to the transverse carpal ligament then carefully lifed and incised with SGerilyn Nestlescissors inline with the fourth digit. The skin and subcutaneous tissue retracted and the volar fascia divided under direct vision from distal to proximal along the distal volar forearm about 4-5 cm. A freer elevator then carefully placed between the median nerve and the transverse carpal ligament protecting the  median nerve as the transverse carpal ligament was divided with a 15 blade scalpel  in line with the fourth digit. Retracting the distal skin and subcutaneous tissues distally with a Sewell retractor under direct visualization the remaining portions of the transverse carpal ligament were divided with tenotomy scissors again in line with the fourth digit. The palmar fascia was then divided until the traversing proximal superficial palmar arch was identified and preserved intact.  The motor branch of the median nerve was carefully examined and identified intact. Tourniquet was then released. Bleeding controlled with bipolar electrocautery. The incision was then irrigated with copious amounts of irrigant solution, No active bleeding was present. The incision closed with a single layer skin closure of 4-0 nylon horizontal mattress sutures. Dry dressing of adaptic, 4x4s held in place with sterile webril.  A well padded volar splint applied with ace wrap.  The patient reactivated and returned to the PACU in good condition.  All instruments and sponge counts were correct.          JBasil Dess 10/25/2019, 8:47 AM

## 2019-10-25 NOTE — Transfer of Care (Signed)
Immediate Anesthesia Transfer of Care Note  Patient: Olivia Ewing  Procedure(s) Performed: LEFT CARPAL TUNNEL RELEASE (Left Hand)  Patient Location: PACU  Anesthesia Type:General  Level of Consciousness: awake, alert , oriented, drowsy and patient cooperative  Airway & Oxygen Therapy: Patient Spontanous Breathing and Patient connected to face mask oxygen  Post-op Assessment: Report given to RN and Post -op Vital signs reviewed and stable  Post vital signs: Reviewed and stable  Last Vitals:  Vitals Value Taken Time  BP    Temp    Pulse 93 10/25/19 0835  Resp    SpO2 100 % 10/25/19 0835  Vitals shown include unvalidated device data.  Last Pain:  Vitals:   10/25/19 0634  TempSrc: Oral  PainSc: 10-Worst pain ever      Patients Stated Pain Goal: 3 (54/27/06 2376)  Complications: No complications documented.

## 2019-10-25 NOTE — Addendum Note (Signed)
Addendum  created 10/25/19 1038 by Willa Frater, CRNA   Charge Capture section accepted, Visit diagnoses modified

## 2019-10-25 NOTE — Discharge Instructions (Addendum)
Keep dressing dry. Elevated left wrist above heart. Apply ice to palm side of wrist two hours on and one half hour off for 48 hours. May Apply ice at night and go to sleep with out changing. Be sure to keep ice off fingers to prevent frost bite.  Return to office in two weeks for removal of suture left wrist. Splint will be used for total of 3 weeks post surgery. Endoscopic Carpal Tunnel Release, Care After This sheet gives you information about how to care for yourself after your procedure. Your health care provider may also give you more specific instructions. If you have problems or questions, contact your health care provider. What can I expect after the procedure? After the procedure, it is common to have:  Pain.  Swelling.  Stiffness. Follow these instructions at home: Medicines  Take over-the-counter and prescription medicines only as told by your health care provider.  Ask your health care provider if the medicine prescribed to you requires you to avoid driving or using heavy machinery. Incision care   Wear your wraparound compression bandage on your hand until you go back for your follow-up visit. Do not let this bandage get wet. Cover it with a watertight covering when you take a bath or a shower.  After the compression bandage has been removed, follow instructions from your health care provider about how to take care of your incision. Make sure you: ? Wash your hands with soap and water before and after you change any additional bandage (dressing). If soap and water are not available, use hand sanitizer. ? Change this dressing as told by your health care provider. ? Leave stitches (sutures), skin glue, or adhesive strips in place. These skin closures may need to stay in place for 2 weeks or longer. If adhesive strip edges start to loosen and curl up, you may trim the loose edges. Do not remove adhesive strips completely unless your health care provider tells you to do  that.  After the compression bandage has been removed, check your incision area every day for signs of infection. Check for: ? More redness, swelling, or pain. ? More fluid or blood. ? Warmth. ? Pus or a bad smell.  Do not take baths, swim, or use a hot tub until your health care provider approves. Ask your health care provider if you may take showers. Managing pain, stiffness, and swelling   If directed, put ice on your wrist area. To do this: ? If you have a removable splint or brace, remove it as told by your health care provider. ? Put ice in a plastic bag. ? Place a towel between your skin and the bag or between your bandage and the bag. ? Leave the ice on for 20 minutes, 2-3 times a day.  Move your fingers often to reduce stiffness and swelling.  Raise (elevate) your wrist area above the level of your heart while you are sitting or lying down. If you have a splint or brace:   Wear the splint or brace as told by your health care provider. Remove it only as told by your health care provider.  Loosen the splint or brace if your fingers tingle, become numb, or turn cold and blue.  Keep the splint or brace clean.  If the splint or brace is not waterproof: ? Do not let it get wet. ? Cover it with a watertight covering when you take a bath or a shower. Activity  Use your hand  carefully. Do not do activities that cause pain. You should be able to do light activities with your hand.  Do not lift with your affected hand until your health care provider approves.  Avoid pulling and pushing.  Return to your normal activities as told by your health care provider. Ask your health care provider what activities are safe for you.  Ask your health care provider when it is safe to drive. General instructions  Do not use any products that contain nicotine or tobacco, such as cigarettes, e-cigarettes, and chewing tobacco. These can delay incision healing after surgery. If you need help  quitting, ask your health care provider.  Keep all follow-up visits as told by your health care provider. This is important. Contact a health care provider if you have:  Chills or a fever.  Pain that is not controlled by your pain medicine.  Discoloration of your fingers.  Tingling, weakness, or numbness.  More redness, swelling, or pain around your incision.  More fluid or blood coming from your incision.  An incision that feels warm to the touch.  Pus or a bad smell coming from your incision. Summary  After the procedure, it is common to have pain, swelling, and stiffness. Call your health care provider if you have pain that is not controlled with medicine.  Return to your health care provider to have your compression bandage removed. After it has been removed, check your incision area every day for signs of infection.  If you have a splint or brace, wear it as told by your health care provider. Move your fingers often, and keep your hand above the level of your heart when resting.  Return to your normal activities as told by your health care provider. Ask your health care provider what activities are safe for you. You should be able to do light activities with your hand.  Contact a health care provider if you have numbness, weakness, a fever, or any signs of infection. This information is not intended to replace advice given to you by your health care provider. Make sure you discuss any questions you have with your health care provider. Document Revised: 05/28/2018 Document Reviewed: 05/28/2018 Elsevier Patient Education  [place] Instructions  Activity: Get plenty of rest for the remainder of the day. A responsible individual must stay with you for 24 hours following the procedure.  For the next 24 hours, DO NOT: -Drive a car -Paediatric nurse -Drink alcoholic beverages -Take any medication unless instructed by your  physician -Make any legal decisions or sign important papers.  Meals: Start with liquid foods such as gelatin or soup. Progress to regular foods as tolerated. Avoid greasy, spicy, heavy foods. If nausea and/or vomiting occur, drink only clear liquids until the nausea and/or vomiting subsides. Call your physician if vomiting continues.  Special Instructions/Symptoms: Your throat may feel dry or sore from the anesthesia or the breathing tube placed in your throat during surgery. If this causes discomfort, gargle with warm salt water. The discomfort should disappear within 24 hours.  If you had a scopolamine patch placed behind your ear for the management of post- operative nausea and/or vomiting:  1. The medication in the patch is effective for 72 hours, after which it should be removed.  Wrap patch in a tissue and discard in the trash. Wash hands thoroughly with soap and water. 2. You may remove the patch earlier than 72 hours if you experience unpleasant  side effects which may include dry mouth, dizziness or visual disturbances. 3. Avoid touching the patch. Wash your hands with soap and water after contact with the patch.

## 2019-10-25 NOTE — Brief Op Note (Signed)
10/25/2019  8:45 AM  PATIENT:  Olivia Ewing  69 y.o. female  PRE-OPERATIVE DIAGNOSIS:  severe left carpal tunnel syndrome  POST-OPERATIVE DIAGNOSIS:  severe left carpal tunnel syndrome  PROCEDURE:  Procedure(s): LEFT CARPAL TUNNEL RELEASE (Left)  SURGEON:  Surgeon(s) and Role:    * Jessy Oto, MD - Primary  PHYSICIAN ASSISTANT: None  ANESTHESIA:   local and general, Dr. Candida Peeling, MD   EBL:  2 mL   BLOOD ADMINISTERED:none  DRAINS: none   LOCAL MEDICATIONS USED:  MARCAINE  0.5%  Total 8 cc.    SPECIMEN:  No Specimen  DISPOSITION OF SPECIMEN:  N/A  COUNTS:  YES  TOURNIQUET:   Total Tourniquet Time Documented: Forearm (Left) - 10 minutes Total: Forearm (Left) - 10 minutes   DICTATION: .Viviann Spare Dictation  PLAN OF CARE: Discharge to home after PACU  PATIENT DISPOSITION:  PACU - hemodynamically stable.   Delay start of Pharmacological VTE agent (>24hrs) due to surgical blood loss or risk of bleeding: yes

## 2019-10-26 ENCOUNTER — Encounter: Payer: Medicare HMO | Admitting: Physical Therapy

## 2019-10-29 ENCOUNTER — Other Ambulatory Visit: Payer: Self-pay

## 2019-10-29 ENCOUNTER — Ambulatory Visit: Payer: Medicare HMO | Attending: Orthopaedic Surgery | Admitting: Physical Therapy

## 2019-10-29 DIAGNOSIS — M25611 Stiffness of right shoulder, not elsewhere classified: Secondary | ICD-10-CM | POA: Diagnosis present

## 2019-10-29 DIAGNOSIS — M25511 Pain in right shoulder: Secondary | ICD-10-CM | POA: Insufficient documentation

## 2019-10-29 NOTE — Therapy (Signed)
[place], [place], [address] Phone: [phone]   Fax:  [phone]  Physical Therapy Treatment  Patient Details  Name: Olivia Ewing MRN: [id number] Date of Birth: May 12, 1950 Referring Provider (PT): Leandrew Koyanagi, MD   Encounter Date: 10/29/2019   PT End of Session - 10/29/19 1045    Visit Number 3    Number of Visits 16    Date for PT Re-Evaluation 11/19/19    Authorization Type [employer] MCR    PT Start Time 1045    PT Stop Time 1130    PT Time Calculation (min) 45 min    Activity Tolerance Patient tolerated treatment well    Behavior During Therapy [facility] for tasks assessed/performed           Past Medical History:  Diagnosis Date  . Anemia   . Arthritis    knees hands  . Bipolar disorder ([place])   . Cervicalgia   . Depression   . Environmental allergies    cause SOB, uses inhaler for  . Environmental and seasonal allergies    uses inhaler prn  . Full dentures   . GERD (gastroesophageal reflux disease)    diet controlled - no meds  . Hyperlipidemia    diet controlled, no meds  . Hypertension   . Lumbar radiculopathy, chronic   . Right knee pain    posterior horn medial meniscal tear MRI 2013  . Spinal stenosis    getting epidural injections -last one 06/12/2015  . SVD (spontaneous vaginal delivery)    x 4    Past Surgical History:  Procedure Laterality Date  . APPENDECTOMY    . Bladder tack  10/2006   cystocoele  . BREAST SURGERY Right    benign cyst  . COLONOSCOPY    . Hysterectomy other    . LUMBAR LAMINECTOMY N/A 03/16/2019   Procedure: CENTRAL LAMINECTOMIES L2-3, L3-4 AND L4-5;  Surgeon: Jessy Oto, MD;  Location: [facility];  Service: Orthopedics;  Laterality: N/A;  . TOTAL HIP ARTHROPLASTY Right 04/27/2018   Procedure: RIGHT TOTAL HIP ARTHROPLASTY ANTERIOR APPROACH;  Surgeon: Leandrew Koyanagi, MD;  Location: [facility];  Service: Orthopedics;  Laterality: Right;  . TUBAL LIGATION        There were no vitals filed for this visit.   Subjective Assessment - 10/29/19 1059    Subjective Pt states that shoulder is doing good. Pt got carpal tunnel surgery on her left wrist. Pt notes a "knot" near her incision.    Currently in Pain? Yes    Pain Score 5                              [facility] Adult PT Treatment/Exercise - 10/29/19 0001      Shoulder Exercises: Standing   Horizontal ABduction Right;Strengthening;10 reps;Theraband    Theraband Level (Shoulder Horizontal ABduction) Level 2 (Red)    External Rotation Strengthening;Right;15 reps   limited due to pt feeling it in her lumbar incision + pain   Theraband Level (Shoulder External Rotation) Level 3 (Green)    Internal Rotation Strengthening;Right;20 reps    Theraband Level (Shoulder Internal Rotation) Level 3 (Green)    Row Strengthening;20 reps;Right    Theraband Level (Shoulder Row) Level 3 (Green)    Retraction Strengthening;Both;10 reps    Other Standing Exercises low trap setting against wall x10    Other Standing Exercises neck retraction x10  Shoulder Exercises: ROM/Strengthening   Wall Wash flexion & abduction x 10 with 2 sec hold at end range      Manual Therapy   Manual Therapy Soft tissue mobilization;Myofascial release    Soft tissue mobilization R anterior shoulder/Scar massage    Myofascial Release R anterior shoulder/scar      Neck Exercises: Stretches   Upper Trapezius Stretch Right;Left;30 seconds    Levator Stretch Right;Left;30 seconds                    PT Short Term Goals - 10/29/19 1103      PT SHORT TERM GOAL #1   Title Pt will be independent with initial HEP    Baseline Newly provided    Time 4    Period Weeks    Status Achieved    Target Date 10/22/19      PT SHORT TERM GOAL #2   Title Pt will be able to perform full shoulder ROM without pain    Baseline Flexion ~150 deg, abduction ~105 deg    Time 4    Period Weeks    Status Partially Met     Target Date 10/22/19      PT SHORT TERM GOAL #3   Title Pt will be able to self correct posture without PT cueing    Baseline Requires cueing to decrease rounded shoulder    Time 4    Period Weeks    Status On-going    Target Date 10/22/19             PT Long Term Goals - 09/24/19 1207      PT LONG TERM GOAL #1   Title Pt will be independent with advanced HEP    Time 8    Period Weeks    Status New    Target Date 10/22/19      PT LONG TERM GOAL #2   Title Pt will improve shoulder strength to be able to lift at least 5#s overhead    Baseline Unable    Time 8    Period Weeks    Status New    Target Date 11/19/19      PT LONG TERM GOAL #3   Title Pt will be able to carry at least 10 lbs without pain    Baseline Unable    Time 8    Period Weeks    Status New    Target Date 11/19/19      PT LONG TERM GOAL #4   Title Pt will be able to dress herself without pain    Time 8    Period Weeks    Status New    Target Date 11/19/19      PT LONG TERM GOAL #5   Title Pt will improve FOTO score to at least 39%    Baseline FOTO with 62% limitation    Time 8    Period Weeks    Status New    Target Date 11/19/19                 Plan - 10/29/19 1114    Clinical Impression Statement Pt with much improved shoulder ROM and pain. Pt found to have some pain along the scar of her anterior incision. Provided scar massage, stretching, taping and ROM for improved relief. Continued to progress shoulder/neck strengthening and posture.    Personal Factors and Comorbidities Age;Comorbidity 1;Comorbidity 2;Time since onset of injury/illness/exacerbation;Comorbidity 3+    Comorbidities  arthritis, HTN, back pain    Examination-Activity Limitations Reach Overhead;Caring for Others;Carry;Dressing;Lift    Examination-Participation Restrictions Community Activity;Cleaning;Occupation;Interpersonal Relationship;Meal Prep    Rehab Potential Good    PT Frequency 2x / week    PT  Duration 8 weeks    PT Treatment/Interventions ADLs/Self Care Home Management;Cryotherapy;Electrical Stimulation;Iontophoresis 57m/ml Dexamethasone;Moist Heat;Ultrasound;Functional mobility training;Therapeutic activities;Therapeutic exercise;Neuromuscular re-education;Patient/family education;Manual techniques;Passive range of motion;Dry needling;Taping;Vasopneumatic Device    PT Next Visit Plan Assess response to HEP. Continue AAROM and AROM for shoulder as able. Progress pt's scapular strengthening and mobility. Manual therapy if indicated.    [place]          Patient will benefit from skilled therapeutic intervention in order to improve the following deficits and impairments:  Decreased coordination, Decreased range of motion, Increased fascial restricitons, Impaired UE functional use, Impaired perceived functional ability, Pain, Improper body mechanics, Decreased mobility, Decreased strength, Postural dysfunction  Visit Diagnosis: Right shoulder pain, unspecified chronicity  Stiffness of right shoulder, not elsewhere classified     Problem List Patient Active Problem List   Diagnosis Date Noted  . Carpal tunnel syndrome, left upper limb 10/25/2019    Class: Chronic  . Carpal tunnel syndrome, right upper limb 10/25/2019    Class: Chronic  . Nontraumatic incomplete tear of right rotator cuff 07/27/2019  . Tendinopathy of right biceps tendon 07/27/2019  . Breast cancer screening by mammogram 06/07/2019  . Seasonal allergic rhinitis due to pollen 06/07/2019  . Colon cancer screening 06/07/2019  . Spinal stenosis, lumbar region, with neurogenic claudication 03/16/2019    Class: Chronic  . Status post lumbar laminectomy 03/16/2019  . Acute stress reaction 09/25/2018  . Primary osteoarthritis of right hip 04/27/2018  . Status post total hip replacement, 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  . Shortness of breath 01/02/2018  . Chalazion left upper eyelid 02/20/2017  . Vitamin  D insufficiency 11/25/2013  . Healthcare maintenance 11/25/2013  . Spinal stenosis of lumbar region 07/02/2013  . Arthritis, degenerative 11/17/2012  . Arthrosis of right acromioclavicular joint 06/05/2012  . Rectocele 04/28/2012  . Left knee DJD, degenerative meniscus tear 04/06/2012  . Numbness and tingling in hands 03/17/2012  . Periodontitis 09/23/2011  . Depression 09/23/2011  . Right knee meniscal tear 03/14/2011  . Lumbar radiculopathy 03/14/2011  . Stress incontinence of urine 10/18/2010  . NECK PAIN, CHRONIC 08/19/2008  . Normocytic anemia 05/12/2008  . SHOULDER PAIN, RIGHT 04/28/2008  . Hyperlipemia 06/18/2007  . Essential hypertension 05/20/2007    GFranklin County Memorial HospitalApril Ma L Shiloh Southern PT, DPT 10/29/2019, 11:31 AM  [facility] [address]Phone: [phone]  Fax:  [phone] Name: SCHRISTINIA LAMBETHMRN: [id number] of Birth: [birth year]

## 2019-11-01 ENCOUNTER — Encounter (HOSPITAL_BASED_OUTPATIENT_CLINIC_OR_DEPARTMENT_OTHER): Payer: Self-pay | Admitting: Specialist

## 2019-11-02 ENCOUNTER — Ambulatory Visit: Payer: Medicare HMO | Admitting: Physical Therapy

## 2019-11-02 ENCOUNTER — Telehealth: Payer: Self-pay | Admitting: Physical Therapy

## 2019-11-02 NOTE — Telephone Encounter (Signed)
Returned pt's call per her request.  Pt crying on the phone due to increased pain in her back, wrist, and shoulder incisions. Pt states she doesn't want to take her pain medication because it makes her too drowsy to safely drive to therapy. Discussed with pt that if she is hurting this much she should take her medication, rest, and follow-up with her orthopedic surgeon to ensure there are no issues. Pt denies any fever or chills. Pt verbalizes understanding.   Marke Goodwyn April Gordy Levan, PT, DPT

## 2019-11-04 ENCOUNTER — Encounter: Payer: Self-pay | Admitting: Surgery

## 2019-11-04 ENCOUNTER — Ambulatory Visit (INDEPENDENT_AMBULATORY_CARE_PROVIDER_SITE_OTHER): Payer: Medicare HMO | Admitting: Surgery

## 2019-11-04 DIAGNOSIS — Z9889 Other specified postprocedural states: Secondary | ICD-10-CM

## 2019-11-04 DIAGNOSIS — M25532 Pain in left wrist: Secondary | ICD-10-CM

## 2019-11-04 MED ORDER — CEPHALEXIN 500 MG PO CAPS: 500.0000 mg | ORAL_CAPSULE | Freq: Four times a day (QID) | ORAL | 0 refills | Status: DC

## 2019-11-04 MED ORDER — HYDROCODONE-ACETAMINOPHEN 7.5-325 MG PO TABS: 1.0000 | ORAL_TABLET | Freq: Four times a day (QID) | ORAL | 0 refills | Status: DC | PRN

## 2019-11-04 MED ORDER — PROMETHAZINE HCL 12.5 MG PO TABS: 12.5000 mg | ORAL_TABLET | Freq: Three times a day (TID) | ORAL | 0 refills | Status: DC | PRN

## 2019-11-04 NOTE — Progress Notes (Signed)
Post-Op Visit Note   Patient: Olivia Ewing           Date of Birth: [birth year]           MRN: [id number] Visit Date: 11/04/2019 PCP: [name], MD   Assessment & Plan:  Chief Complaint:  Chief Complaint  Patient presents with  . Left Hand - Follow-up  69 year old black female who is 10 days status post left carpal tunnel release returns.  Patient states that she took her splint off 2 days ago due to it causing some burning around the incision.  States that she has been having increased pain throughout her wrist and some of her forearm.    Visit Diagnoses:  1. Pain in left wrist   2. S/P carpal tunnel release     Plan: Send blood work to check CBC with differential.  Temp in the office 98.3.  Steri-Strips were applied to her wound along with a new padded splint.  Patient was advised not to remove this.  Will start Keflex 500 mg p.o. every 6 hours.  She does not have an obvious infection but this is more for prophylaxis.  Also refilled Norco 7.5/325.  Patient states that she gets Phenergan from her primary care physician and asked if I could give her some today and this was also given.  Follow-up with me at her regular scheduled appointment September 29.  Follow-Up Instructions: Return in about 6 days (around 11/10/2019) for wound check.   Orders:  Orders Placed This Encounter  Procedures  . CBC With Differential   Meds ordered this encounter  Medications  . HYDROcodone-acetaminophen (NORCO) 7.5-325 MG tablet    Sig: Take 1 tablet by mouth every 6 (six) hours as needed for moderate pain.    Dispense:  30 tablet    Refill:  0  . cephALEXin (KEFLEX) 500 MG capsule    Sig: Take 1 capsule (500 mg total) by mouth every 6 (six) hours.    Dispense:  40 capsule    Refill:  0  . promethazine (PHENERGAN) 12.5 MG tablet    Sig: Take 1 tablet (12.5 mg total) by mouth every 8 (eight) hours as needed for nausea or vomiting.    Dispense:  15 tablet    Refill:  0     Imaging: No results found.  PMFS History: Patient Active Problem List   Diagnosis Date Noted  . Carpal tunnel syndrome, left upper limb 10/25/2019    Class: Chronic  . Carpal tunnel syndrome, right upper limb 10/25/2019    Class: Chronic  . Nontraumatic incomplete tear of right rotator cuff 07/27/2019  . Tendinopathy of right biceps tendon 07/27/2019  . Breast cancer screening by mammogram 06/07/2019  . Seasonal allergic rhinitis due to pollen 06/07/2019  . Colon cancer screening 06/07/2019  . Spinal stenosis, lumbar region, with neurogenic claudication 03/16/2019    Class: Chronic  . Status post lumbar laminectomy 03/16/2019  . Acute stress reaction 09/25/2018  . Primary osteoarthritis of right hip 04/27/2018  . Status post total hip replacement, 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  . Shortness of breath 01/02/2018  . Chalazion left upper eyelid 02/20/2017  . Vitamin D insufficiency 11/25/2013  . Healthcare maintenance 11/25/2013  . Spinal stenosis of lumbar region 07/02/2013  . Arthritis, degenerative 11/17/2012  . Arthrosis of right acromioclavicular joint 06/05/2012  . Rectocele 04/28/2012  . Left knee DJD, degenerative meniscus tear 04/06/2012  . Numbness and tingling in hands 03/17/2012  . Periodontitis 09/23/2011  . Depression  09/23/2011  . Right knee meniscal tear 03/14/2011  . Lumbar radiculopathy 03/14/2011  . Stress incontinence of urine 10/18/2010  . NECK PAIN, CHRONIC 08/19/2008  . Normocytic anemia 05/12/2008  . SHOULDER PAIN, RIGHT 04/28/2008  . Hyperlipemia 06/18/2007  . Essential hypertension 05/20/2007   Past Medical History:  Diagnosis Date  . Anemia   . Arthritis    knees hands  . Bipolar disorder ([place])   . Cervicalgia   . Depression   . Environmental allergies    cause SOB, uses inhaler for  . Environmental and seasonal allergies    uses inhaler prn  . Full dentures   . GERD (gastroesophageal reflux disease)    diet controlled - no meds  .  Hyperlipidemia    diet controlled, no meds  . Hypertension   . Lumbar radiculopathy, chronic   . Right knee pain    posterior horn medial meniscal tear MRI 2013  . Spinal stenosis    getting epidural injections -last one 06/12/2015  . SVD (spontaneous vaginal delivery)    x 4    Family History  Problem Relation Age of Onset  . Hypertension Mother     Past Surgical History:  Procedure Laterality Date  . APPENDECTOMY    . Bladder tack  10/2006   cystocoele  . BREAST SURGERY Right    benign cyst  . CARPAL TUNNEL RELEASE Left 10/25/2019   Procedure: LEFT CARPAL TUNNEL RELEASE;  Surgeon: Jessy Oto, MD;  Location: [facility];  Service: Orthopedics;  Laterality: Left;  . COLONOSCOPY    . Hysterectomy other    . LUMBAR LAMINECTOMY N/A 03/16/2019   Procedure: CENTRAL LAMINECTOMIES L2-3, L3-4 AND L4-5;  Surgeon: Jessy Oto, MD;  Location: [facility];  Service: Orthopedics;  Laterality: N/A;  . TOTAL HIP ARTHROPLASTY Right 04/27/2018   Procedure: RIGHT TOTAL HIP ARTHROPLASTY ANTERIOR APPROACH;  Surgeon: Leandrew Koyanagi, MD;  Location: [facility];  Service: Orthopedics;  Laterality: Right;  . TUBAL LIGATION     Social History   Occupational History  . Not on file  Tobacco Use  . Smoking status: Former Smoker    Packs/day: 0.10    Quit date: 11/26/1983    Years since quitting: 35.9  . Smokeless tobacco: Never Used  Vaping Use  . Vaping Use: Never used  Substance and Sexual Activity  . Alcohol use: Yes    Alcohol/week: 0.0 standard drinks    Comment: rarely  . Drug use: Not Currently    Types: Marijuana    Comment: last used 2019  . Sexual activity: Not Currently    Birth control/protection: Surgical   Exam At times during patient's visit she was crying but then also other times cheerful.  She does have some slight separation of her surgical incision of about 3 to 4 mm.  I did leave remaining sutures.  No drainage or gross signs of infection.  The volar aspect of her  wrist is exquisitely tender and this extends up to the distal one third of her forearm where she does have some swelling.  Moves fingers but does have some discomfort and stiffness with flexion.  Neurovascular intact.  Radial pulse intact.

## 2019-11-05 ENCOUNTER — Ambulatory Visit: Payer: Medicare HMO | Admitting: Physical Therapy

## 2019-11-05 ENCOUNTER — Telehealth: Payer: Self-pay | Admitting: Physical Therapy

## 2019-11-05 NOTE — Telephone Encounter (Signed)
Attempted to leave voicemail due to pt with missed appointment today at 10:45am. Pt's voicemail box is currently full.  Rim Thatch April Gordy Levan, PT, DPT

## 2019-11-06 LAB — SPECIMEN STATUS REPORT

## 2019-11-09 ENCOUNTER — Telehealth: Payer: Self-pay

## 2019-11-09 LAB — CBC WITH DIFFERENTIAL
Basophils Absolute: 0 10*3/uL (ref 0.0–0.2)
Basos: 1 %
EOS (ABSOLUTE): 0.1 10*3/uL (ref 0.0–0.4)
Eos: 2 %
Hematocrit: 33.3 % — ABNORMAL LOW (ref 34.0–46.6)
Hemoglobin: 9.9 g/dL — ABNORMAL LOW (ref 11.1–15.9)
Immature Grans (Abs): 0 10*3/uL (ref 0.0–0.1)
Immature Granulocytes: 0 %
Lymphocytes Absolute: 1 10*3/uL (ref 0.7–3.1)
Lymphs: 20 %
MCH: 24 pg — ABNORMAL LOW (ref 26.6–33.0)
MCHC: 29.7 g/dL — ABNORMAL LOW (ref 31.5–35.7)
MCV: 81 fL (ref 79–97)
Monocytes Absolute: 0.5 10*3/uL (ref 0.1–0.9)
Monocytes: 10 %
Neutrophils Absolute: 3.4 10*3/uL (ref 1.4–7.0)
Neutrophils: 67 %
RBC: 4.12 x10E6/uL (ref 3.77–5.28)
RDW: 15.1 % (ref 11.7–15.4)
WBC: 5 10*3/uL (ref 3.4–10.8)

## 2019-11-09 LAB — SPECIMEN STATUS REPORT

## 2019-11-09 NOTE — Telephone Encounter (Signed)
[facility] called wanting to know if anything was needed from serum sample tube that was collected from patient.  Stated that a form will be sent over if anything is needed.  Cb# [phone].

## 2019-11-10 ENCOUNTER — Ambulatory Visit: Payer: Medicare HMO | Admitting: Surgery

## 2019-11-11 ENCOUNTER — Encounter: Payer: Self-pay | Admitting: Surgery

## 2019-11-11 ENCOUNTER — Ambulatory Visit (INDEPENDENT_AMBULATORY_CARE_PROVIDER_SITE_OTHER): Payer: Medicare HMO | Admitting: Surgery

## 2019-11-11 ENCOUNTER — Other Ambulatory Visit: Payer: Self-pay | Admitting: Internal Medicine

## 2019-11-11 VITALS — BP 118/83 | HR 75 | Ht 65.0 in | Wt 180.0 lb

## 2019-11-11 DIAGNOSIS — F430 Acute stress reaction: Secondary | ICD-10-CM

## 2019-11-11 DIAGNOSIS — Z9889 Other specified postprocedural states: Secondary | ICD-10-CM

## 2019-11-11 DIAGNOSIS — F32A Depression, unspecified: Secondary | ICD-10-CM

## 2019-11-11 NOTE — Progress Notes (Signed)
Post-Op Visit Note   Patient: Olivia Ewing           Date of Birth: 07-21-1950           MRN: [id number] Visit Date: 11/11/2019 PCP: [name], MD   Assessment & Plan:  Chief Complaint:  Chief Complaint  Patient presents with  . Left Wrist - Follow-up  69 year old black female who is 2 weeks status post left carpal tunnel release returns for wound check.  States that her wrist is feeling much better.  Patient was noncompliant again and took off her splint that was applied last office visit.  States that she has had some GI issues and not feeling well.  Not sure if she had a stomach virus. Visit Diagnoses:  1. S/P carpal tunnel release     Plan: today sutures were removed.  Wrist looks much better.  Will work on finger and wrist range of motion.  No aggressive activity with left hand. velcro wrist splint given.  Discontinue keflex   Follow-Up Instructions: Return in about 4 weeks (around 12/09/2019) for with Dr Louanne Skye for recheck left wrist.   Orders:  No orders of the defined types were placed in this encounter.  No orders of the defined types were placed in this encounter.   Imaging: No results found.  PMFS History: Patient Active Problem List   Diagnosis Date Noted  . Carpal tunnel syndrome, left upper limb 10/25/2019    Class: Chronic  . Carpal tunnel syndrome, right upper limb 10/25/2019    Class: Chronic  . Nontraumatic incomplete tear of right rotator cuff 07/27/2019  . Tendinopathy of right biceps tendon 07/27/2019  . Breast cancer screening by mammogram 06/07/2019  . Seasonal allergic rhinitis due to pollen 06/07/2019  . Colon cancer screening 06/07/2019  . Spinal stenosis, lumbar region, with neurogenic claudication 03/16/2019    Class: Chronic  . Status post lumbar laminectomy 03/16/2019  . Acute stress reaction 09/25/2018  . Primary osteoarthritis of right hip 04/27/2018  . Status post total hip replacement, 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  . Shortness of  breath 01/02/2018  . Chalazion left upper eyelid 02/20/2017  . Vitamin D insufficiency 11/25/2013  . Healthcare maintenance 11/25/2013  . Spinal stenosis of lumbar region 07/02/2013  . Arthritis, degenerative 11/17/2012  . Arthrosis of right acromioclavicular joint 06/05/2012  . Rectocele 04/28/2012  . Left knee DJD, degenerative meniscus tear 04/06/2012  . Numbness and tingling in hands 03/17/2012  . Periodontitis 09/23/2011  . Depression 09/23/2011  . Right knee meniscal tear 03/14/2011  . Lumbar radiculopathy 03/14/2011  . Stress incontinence of urine 10/18/2010  . NECK PAIN, CHRONIC 08/19/2008  . Normocytic anemia 05/12/2008  . SHOULDER PAIN, RIGHT 04/28/2008  . Hyperlipemia 06/18/2007  . Essential hypertension 05/20/2007   Past Medical History:  Diagnosis Date  . Anemia   . Arthritis    knees hands  . Bipolar disorder ([place])   . Cervicalgia   . Depression   . Environmental allergies    cause SOB, uses inhaler for  . Environmental and seasonal allergies    uses inhaler prn  . Full dentures   . GERD (gastroesophageal reflux disease)    diet controlled - no meds  . Hyperlipidemia    diet controlled, no meds  . Hypertension   . Lumbar radiculopathy, chronic   . Right knee pain    posterior horn medial meniscal tear MRI 2013  . Spinal stenosis    getting epidural injections -last one 06/12/2015  .  SVD (spontaneous vaginal delivery)    x 4    Family History  Problem Relation Age of Onset  . Hypertension Mother     Past Surgical History:  Procedure Laterality Date  . APPENDECTOMY    . Bladder tack  10/2006   cystocoele  . BREAST SURGERY Right    benign cyst  . CARPAL TUNNEL RELEASE Left 10/25/2019   Procedure: LEFT CARPAL TUNNEL RELEASE;  Surgeon: Jessy Oto, MD;  Location: [facility];  Service: Orthopedics;  Laterality: Left;  . COLONOSCOPY    . Hysterectomy other    . LUMBAR LAMINECTOMY N/A 03/16/2019   Procedure: CENTRAL LAMINECTOMIES L2-3,  L3-4 AND L4-5;  Surgeon: Jessy Oto, MD;  Location: [facility];  Service: Orthopedics;  Laterality: N/A;  . TOTAL HIP ARTHROPLASTY Right 04/27/2018   Procedure: RIGHT TOTAL HIP ARTHROPLASTY ANTERIOR APPROACH;  Surgeon: Leandrew Koyanagi, MD;  Location: [facility];  Service: Orthopedics;  Laterality: Right;  . TUBAL LIGATION     Social History   Occupational History  . Not on file  Tobacco Use  . Smoking status: Former Smoker    Packs/day: 0.10    Quit date: 11/26/1983    Years since quitting: 35.9  . Smokeless tobacco: Never Used  Vaping Use  . Vaping Use: Never used  Substance and Sexual Activity  . Alcohol use: Yes    Alcohol/week: 0.0 standard drinks    Comment: rarely  . Drug use: Not Currently    Types: Marijuana    Comment: last used 2019  . Sexual activity: Not Currently    Birth control/protection: Surgical   Exam Today sutures were removed and Steri-Strips applied.  No drainage or signs infection.  Wound looks much better.  Swelling in the wrist also improved and she is no longer tender around the wrist.  Moving fingers well.

## 2019-11-12 NOTE — Telephone Encounter (Signed)
Attempted to call patient to clarify if she is taking the buspirone. I was unable to reach her or leave a message.

## 2019-11-13 ENCOUNTER — Other Ambulatory Visit: Payer: Self-pay | Admitting: Internal Medicine

## 2019-11-13 DIAGNOSIS — J301 Allergic rhinitis due to pollen: Secondary | ICD-10-CM

## 2019-11-19 ENCOUNTER — Encounter: Payer: Self-pay | Admitting: Physical Therapy

## 2019-11-19 ENCOUNTER — Other Ambulatory Visit: Payer: Self-pay

## 2019-11-19 ENCOUNTER — Ambulatory Visit: Payer: Medicare HMO | Attending: Orthopaedic Surgery | Admitting: Physical Therapy

## 2019-11-19 DIAGNOSIS — M25611 Stiffness of right shoulder, not elsewhere classified: Secondary | ICD-10-CM

## 2019-11-19 DIAGNOSIS — M25511 Pain in right shoulder: Secondary | ICD-10-CM | POA: Diagnosis present

## 2019-11-19 NOTE — Therapy (Signed)
[place], [place], [address] Phone: [phone]   Fax:  [phone]  Physical Therapy Treatment  Patient Details  Name: Olivia Ewing MRN: [id number] Date of Birth: 09-Jun-1950 Referring Provider (PT): Leandrew Koyanagi, MD   Encounter Date: 11/19/2019   PT End of Session - 11/19/19 1200    Visit Number 4    Number of Visits 16    Date for PT Re-Evaluation 01/14/20    Authorization Type [employer] MCR/MCD    Progress Note Due on Visit 10    PT Start Time 1128    PT Stop Time 1206    PT Time Calculation (min) 38 min    Activity Tolerance Patient tolerated treatment well    Behavior During Therapy [facility] for tasks assessed/performed           Past Medical History:  Diagnosis Date  . Anemia   . Arthritis    knees hands  . Bipolar disorder ([place])   . Cervicalgia   . Depression   . Environmental allergies    cause SOB, uses inhaler for  . Environmental and seasonal allergies    uses inhaler prn  . Full dentures   . GERD (gastroesophageal reflux disease)    diet controlled - no meds  . Hyperlipidemia    diet controlled, no meds  . Hypertension   . Lumbar radiculopathy, chronic   . Right knee pain    posterior horn medial meniscal tear MRI 2013  . Spinal stenosis    getting epidural injections -last one 06/12/2015  . SVD (spontaneous vaginal delivery)    x 4    Past Surgical History:  Procedure Laterality Date  . APPENDECTOMY    . Bladder tack  10/2006   cystocoele  . BREAST SURGERY Right    benign cyst  . CARPAL TUNNEL RELEASE Left 10/25/2019   Procedure: LEFT CARPAL TUNNEL RELEASE;  Surgeon: Jessy Oto, MD;  Location: [facility];  Service: Orthopedics;  Laterality: Left;  . COLONOSCOPY    . Hysterectomy other    . LUMBAR LAMINECTOMY N/A 03/16/2019   Procedure: CENTRAL LAMINECTOMIES L2-3, L3-4 AND L4-5;  Surgeon: Jessy Oto, MD;  Location: [facility];  Service: Orthopedics;   Laterality: N/A;  . TOTAL HIP ARTHROPLASTY Right 04/27/2018   Procedure: RIGHT TOTAL HIP ARTHROPLASTY ANTERIOR APPROACH;  Surgeon: Leandrew Koyanagi, MD;  Location: [facility];  Service: Orthopedics;  Laterality: Right;  . TUBAL LIGATION      There were no vitals filed for this visit.   Subjective Assessment - 11/19/19 1131    Subjective I was sick for 2 weeks.  Shoulder is aching.  Wrist got infected, so she was dealing with that too.    Patient Stated Goals Putting on shirt without pain, lifting and carrying, sleep better    Currently in Pain? Yes    Pain Score 8     Pain Location Shoulder    Pain Orientation Right;Proximal    Pain Descriptors / Indicators Aching;Sore    Pain Type Surgical pain;Chronic pain    Pain Onset More than a month ago    Pain Frequency Intermittent    Aggravating Factors  reaching, sudden movements    Pain Relieving Factors heating, meds, pillows              [facility] PT Assessment - 11/19/19 0001      Assessment   Medical Diagnosis S/p right shoulder scope biceps tenotomy  Referring Provider (PT) Leandrew Koyanagi, MD    Onset Date/Surgical Date 08/25/19    Hand Dominance Right      Precautions   Precautions None      Restrictions   Weight Bearing Restrictions No      Balance Screen   Has the patient fallen in the past 6 months No      [place] residence      Prior Function   Level of Independence Independent      AROM   Right Shoulder Flexion 80 Degrees   bent arm elevation 105 deg    Right Shoulder ABduction 95 Degrees    Right Shoulder Internal Rotation --   FR to Rt hip PAIN    Right Shoulder External Rotation 50 Degrees   FR to back of neck , elbow in      Strength   Right Shoulder Flexion 2+/5    Right Shoulder ABduction 2+/5    Right Shoulder Internal Rotation 4/5    Right Shoulder External Rotation 4-/5                [facility] Adult PT Treatment/Exercise - 11/19/19 0001      Self-Care    Self-Care Other Self-Care Comments    Other Self-Care Comments  hurt vs harm, strength, FOTO       Shoulder Exercises: Supine   External Rotation AAROM    External Rotation Limitations cane x 10     Flexion AAROM;Both;10 reps    Shoulder Flexion Weight (lbs) cane x 10 and chest press x 10       Shoulder Exercises: Standing   Horizontal ABduction Right;Strengthening;10 reps;Theraband    Theraband Level (Shoulder Horizontal ABduction) Level 2 (Red)    External Rotation Strengthening;Right;15 reps    Theraband Level (Shoulder External Rotation) Level 2 (Red)    Internal Rotation Strengthening;Right;20 reps    Theraband Level (Shoulder Internal Rotation) Level 2 (Red)    Row Strengthening;20 reps;Right    Theraband Level (Shoulder Row) Level 2 (Red)                  PT Education - 11/19/19 1209    Education Details reviewed FOTO, current HEP and band tension, has not been compliant due to illness and Lt UE infection    Person(s) Educated Patient    Methods Explanation;Demonstration;Verbal cues;Handout    Comprehension Verbalized understanding;Returned demonstration            PT Short Term Goals - 11/19/19 1148      PT SHORT TERM GOAL #1   Title Pt will be independent with initial HEP    Baseline needed cues    Status On-going      PT SHORT TERM GOAL #2   Title Pt will be able to perform full shoulder ROM without pain    Status On-going      PT SHORT TERM GOAL #3   Title Pt will be able to self correct posture without PT cueing    Status On-going             PT Long Term Goals - 11/19/19 1153      PT LONG TERM GOAL #1   Title Pt will be independent with advanced HEP    Status On-going      PT LONG TERM GOAL #2   Title Pt will improve shoulder strength to be able to lift at least 5#s overhead    Status  On-going      PT LONG TERM GOAL #3   Title Pt will be able to carry at least 10 lbs without pain    Status On-going      PT LONG TERM GOAL #4    Title Pt will be able to dress herself without pain    Status On-going      PT LONG TERM GOAL #5   Title Pt will improve FOTO score to at least 39%    Status On-going                 Plan - 11/19/19 1135    Clinical Impression Statement Patient returns to PT after several weeks off due to illness . Her FOTO score is improved to 43% limited.  She has increased neck and upper back tension, Rt UE weakness, stiiffness and pain. She has done her HEP only minimally but is re-committing to this going forward.    Personal Factors and Comorbidities Age;Comorbidity 1;Comorbidity 2;Time since onset of injury/illness/exacerbation;Comorbidity 3+    Comorbidities arthritis, HTN, back pain, CTS surgery    Examination-Activity Limitations Reach Overhead;Caring for Others;Carry;Dressing;Lift    Examination-Participation Restrictions Community Activity;Cleaning;Occupation;Interpersonal Relationship;Meal Prep    PT Frequency 2x / week    PT Duration 8 weeks    PT Treatment/Interventions ADLs/Self Care Home Management;Cryotherapy;Electrical Stimulation;Iontophoresis 4mg /ml Dexamethasone;Moist Heat;Ultrasound;Functional mobility training;Therapeutic activities;Therapeutic exercise;Neuromuscular re-education;Patient/family education;Manual techniques;Passive range of motion;Dry needling;Taping;Vasopneumatic Device    PT Next Visit Plan Cont AAROM, AROM, strength as tolerated    PT Home Exercise Plan [facility]    Consulted and Agree with Plan of Care Patient           Patient will benefit from skilled therapeutic intervention in order to improve the following deficits and impairments:  Decreased coordination, Decreased range of motion, Increased fascial restricitons, Impaired UE functional use, Impaired perceived functional ability, Pain, Improper body mechanics, Decreased mobility, Decreased strength, Postural dysfunction  Visit Diagnosis: Right shoulder pain, unspecified chronicity  Stiffness of  right shoulder, not elsewhere classified     Problem List Patient Active Problem List   Diagnosis Date Noted  . Carpal tunnel syndrome, left upper limb 10/25/2019    Class: Chronic  . Carpal tunnel syndrome, right upper limb 10/25/2019    Class: Chronic  . Nontraumatic incomplete tear of right rotator cuff 07/27/2019  . Tendinopathy of right biceps tendon 07/27/2019  . Breast cancer screening by mammogram 06/07/2019  . Seasonal allergic rhinitis due to pollen 06/07/2019  . Colon cancer screening 06/07/2019  . Spinal stenosis, lumbar region, with neurogenic claudication 03/16/2019    Class: Chronic  . Status post lumbar laminectomy 03/16/2019  . Acute stress reaction 09/25/2018  . Primary osteoarthritis of right hip 04/27/2018  . Status post total hip replacement, 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  . Shortness of breath 01/02/2018  . Chalazion left upper eyelid 02/20/2017  . Vitamin D insufficiency 11/25/2013  . Healthcare maintenance 11/25/2013  . Spinal stenosis of lumbar region 07/02/2013  . Arthritis, degenerative 11/17/2012  . Arthrosis of right acromioclavicular joint 06/05/2012  . Rectocele 04/28/2012  . Left knee DJD, degenerative meniscus tear 04/06/2012  . Numbness and tingling in hands 03/17/2012  . Periodontitis 09/23/2011  . Depression 09/23/2011  . Right knee meniscal tear 03/14/2011  . Lumbar radiculopathy 03/14/2011  . Stress incontinence of urine 10/18/2010  . NECK PAIN, CHRONIC 08/19/2008  . Normocytic anemia 05/12/2008  . SHOULDER PAIN, RIGHT 04/28/2008  . Hyperlipemia 06/18/2007  . Essential hypertension 05/20/2007    Edrie Ehrich 11/19/2019,  12:12 PM  [place], [place], [address] Phone: [phone]   Fax:  [phone]  Name: Olivia Ewing MRN: [id number] Date of Birth: 1950/07/31  What was this (referring dx) caused by? [x]  Surgery []  Fall []  Ongoing issue []  Arthritis []   Other: ____________  Laterality: [x]  Rt []  Lt []  Both  Check all possible CPT codes:      [x]  97110 (Therapeutic Exercise)  []  92507 (SLP Treatment)  []  97112 (Neuro Re-ed)   []  92526 (Swallowing Treatment)   []  97116 (Gait Training)   []  [id number] (Cognitive Training, 1st 15 minutes) [x]  97140 (Manual Therapy)   []  97130 (Cognitive Training, each add'l 15 minutes)  [x]  97530 (Therapeutic Activities)  []  Other, List CPT Code ____________    [x]  15400 (Self Care)       []  All codes above (97110 - 97535)  []  97012 (Mechanical Traction)  [x]  97014 (E-stim Unattended)  []  97032 (E-stim manual)  []  97033 (Ionto)  [x]  97035 (Ultrasound)  []  97760 (Orthotic Fit) []  [id number] (Physical Performance Training) []  [id number] (Aquatic Therapy) []  [id number] (Contrast Bath) []  [id number] (Paraffin) []  97597 (Wound Care 1st 20 sq cm) []  97598 (Wound Care each add'l 20 sq cm)

## 2019-11-25 ENCOUNTER — Ambulatory Visit: Payer: Medicare HMO

## 2019-11-25 ENCOUNTER — Other Ambulatory Visit: Payer: Self-pay

## 2019-11-25 DIAGNOSIS — M25511 Pain in right shoulder: Secondary | ICD-10-CM | POA: Diagnosis not present

## 2019-11-25 DIAGNOSIS — M25611 Stiffness of right shoulder, not elsewhere classified: Secondary | ICD-10-CM

## 2019-11-25 NOTE — Therapy (Signed)
[place], [place], [address] Phone: [phone]   Fax:  [phone]  Physical Therapy Treatment  Patient Details  Name: Olivia Ewing MRN: [id number] Date of Birth: [birth year] Referring Provider (PT): Leandrew Koyanagi, MD   Encounter Date: 11/25/2019   PT End of Session - 11/25/19 1126    Visit Number 5    Number of Visits 16    Date for PT Re-Evaluation 01/14/20    Authorization Type [employer] MCR/MCD    Progress Note Due on Visit 10    PT Start Time 1130    PT Stop Time 1215    PT Time Calculation (min) 45 min    Activity Tolerance Patient tolerated treatment well    Behavior During Therapy [facility] for tasks assessed/performed           Past Medical History:  Diagnosis Date  . Anemia   . Arthritis    knees hands  . Bipolar disorder ([place])   . Cervicalgia   . Depression   . Environmental allergies    cause SOB, uses inhaler for  . Environmental and seasonal allergies    uses inhaler prn  . Full dentures   . GERD (gastroesophageal reflux disease)    diet controlled - no meds  . Hyperlipidemia    diet controlled, no meds  . Hypertension   . Lumbar radiculopathy, chronic   . Right knee pain    posterior horn medial meniscal tear MRI 2013  . Spinal stenosis    getting epidural injections -last one 06/12/2015  . SVD (spontaneous vaginal delivery)    x 4    Past Surgical History:  Procedure Laterality Date  . APPENDECTOMY    . Bladder tack  10/2006   cystocoele  . BREAST SURGERY Right    benign cyst  . CARPAL TUNNEL RELEASE Left 10/25/2019   Procedure: LEFT CARPAL TUNNEL RELEASE;  Surgeon: Jessy Oto, MD;  Location: [place];  Service: Orthopedics;  Laterality: Left;  . COLONOSCOPY    . Hysterectomy other    . LUMBAR LAMINECTOMY N/A 03/16/2019   Procedure: CENTRAL LAMINECTOMIES L2-3, L3-4 AND L4-5;  Surgeon: Jessy Oto, MD;  Location: [place];  Service: Orthopedics;   Laterality: N/A;  . TOTAL HIP ARTHROPLASTY Right 04/27/2018   Procedure: RIGHT TOTAL HIP ARTHROPLASTY ANTERIOR APPROACH;  Surgeon: Leandrew Koyanagi, MD;  Location: [place];  Service: Orthopedics;  Laterality: Right;  . TUBAL LIGATION      There were no vitals filed for this visit.   Subjective Assessment - 11/25/19 1134    Subjective Patient reports performing HEP and having minimal pain in R shoulder upon arrival with aching at "top of shoulder". Pt reports L wrist is no longer infected.    Limitations Lifting;House hold activities    Patient Stated Goals Putting on shirt without pain, lifting and carrying, sleep better    Currently in Pain? Yes    Pain Score 5     Pain Location Shoulder    Pain Orientation Right;Proximal    Pain Descriptors / Indicators Aching;Sore    Pain Type Surgical pain;Chronic pain    Pain Onset More than a month ago    Pain Frequency Intermittent              [facility] PT Assessment - 11/25/19 0001      Assessment   Medical Diagnosis S/p right shoulder scope biceps tenotomy    Referring Provider (PT)  Leandrew Koyanagi, MD      AROM   Right Shoulder Flexion 145 Degrees    Right Shoulder ABduction 159 Degrees    Right Shoulder Internal Rotation --   pain reaching back- about to pant line at waist   Right Shoulder External Rotation --   T3                        [facility] Adult PT Treatment/Exercise - 11/25/19 0001      Self-Care   Self-Care Other Self-Care Comments    Other Self-Care Comments  Reviewed HEP and asked about progress at home and in clinic; addressed pt's questions regarding what goniometer AROM measurements mean/are interpreted and how stretching and strengthening will improve her function.      Shoulder Exercises: Supine   External Rotation AAROM    External Rotation Limitations cane x 10 performed in sitting    Flexion AAROM;Both;10 reps    Other Supine Exercises serratus punches with cane 3 x 5      Shoulder Exercises: Standing     External Rotation Strengthening;Right;20 reps    Theraband Level (Shoulder External Rotation) Level 2 (Red)    Internal Rotation Strengthening;Right;20 reps    Theraband Level (Shoulder Internal Rotation) Level 2 (Red)    Row Strengthening;20 reps;Right    Theraband Level (Shoulder Row) Level 2 (Red)    Other Standing Exercises lower trap Ys against wall x 20    Other Standing Exercises cervical retraction/chin tucks against wall with towel x 15; small red weighted ball against wall clockwise and counter clockwise x 2 min each with breaks as needed      Shoulder Exercises: ROM/Strengthening   UBE (Upper Arm Bike) L3 x 3 min forward; L 1.5 x 1 min backwards      Manual Therapy   Manual Therapy Soft tissue mobilization;Myofascial release;Joint mobilization    Joint Mobilization Gentle posterior glenohumeral joint mobilizations per pt tolerance to allow for decreased pain at end range of active R shoulder elevation    Soft tissue mobilization R anterior shoulder STM    Myofascial Release R anterior shoulder      Neck Exercises: Stretches   Upper Trapezius Stretch Right;Left;30 seconds    Levator Stretch Right;Left;30 seconds                  PT Education - 11/25/19 1209    Education Details Reviewed HEP and asked about progress at home and in clinic; addressed pt's questions regarding what goniometer AROM measurements mean/are interpreted and how stretching and strengthening will improve her function.    Person(s) Educated Patient    Methods Explanation;Demonstration;Tactile cues;Verbal cues    Comprehension Verbalized understanding;Returned demonstration            PT Short Term Goals - 11/19/19 1148      PT SHORT TERM GOAL #1   Title Pt will be independent with initial HEP    Baseline needed cues    Status On-going      PT SHORT TERM GOAL #2   Title Pt will be able to perform full shoulder ROM without pain    Status On-going      PT SHORT TERM GOAL #3   Title  Pt will be able to self correct posture without PT cueing    Status On-going             PT Long Term Goals - 11/19/19 1153  PT LONG TERM GOAL #1   Title Pt will be independent with advanced HEP    Status On-going      PT LONG TERM GOAL #2   Title Pt will improve shoulder strength to be able to lift at least 5#s overhead    Status On-going      PT LONG TERM GOAL #3   Title Pt will be able to carry at least 10 lbs without pain    Status On-going      PT LONG TERM GOAL #4   Title Pt will be able to dress herself without pain    Status On-going      PT LONG TERM GOAL #5   Title Pt will improve FOTO score to at least 39%    Status On-going                 Plan - 11/25/19 1143    Clinical Impression Statement Pt tolerated treatment session well with slight relief in R shoulder ache compared to arrival following interventions and manual therapy. Pt tolerated addition of interventions and repetitions well with fatigue but no complaints of increase in pain. R shoulder AROM measurements taken again today per pt request and inquiry about her progress, especially after recovering from the flu. Pt limited with active R internal rotation (reaching behind back) with pain. Pt will benefit from continued skilled PT intervention to increase strength and motion to allow for improved functional mobility.    Personal Factors and Comorbidities Age;Comorbidity 1;Comorbidity 2;Time since onset of injury/illness/exacerbation;Comorbidity 3+    Comorbidities arthritis, HTN, back pain, CTS surgery    Examination-Activity Limitations Reach Overhead;Caring for Others;Carry;Dressing;Lift    Examination-Participation Restrictions Community Activity;Cleaning;Occupation;Interpersonal Relationship;Meal Prep    Rehab Potential Good    PT Frequency 2x / week    PT Duration 8 weeks    PT Treatment/Interventions ADLs/Self Care Home Management;Cryotherapy;Electrical Stimulation;Iontophoresis 4mg /ml  Dexamethasone;Moist Heat;Ultrasound;Functional mobility training;Therapeutic activities;Therapeutic exercise;Neuromuscular re-education;Patient/family education;Manual techniques;Passive range of motion;Dry needling;Taping;Vasopneumatic Device    PT Next Visit Plan Cont AAROM, AROM, and strength as tolerated    PT Home Exercise Plan [facility]    Consulted and Agree with Plan of Care Patient           Patient will benefit from skilled therapeutic intervention in order to improve the following deficits and impairments:  Decreased coordination, Decreased range of motion, Increased fascial restricitons, Impaired UE functional use, Impaired perceived functional ability, Pain, Improper body mechanics, Decreased mobility, Decreased strength, Postural dysfunction  Visit Diagnosis: Right shoulder pain, unspecified chronicity  Stiffness of right shoulder, not elsewhere classified     Problem List Patient Active Problem List   Diagnosis Date Noted  . Carpal tunnel syndrome, left upper limb 10/25/2019    Class: Chronic  . Carpal tunnel syndrome, right upper limb 10/25/2019    Class: Chronic  . Nontraumatic incomplete tear of right rotator cuff 07/27/2019  . Tendinopathy of right biceps tendon 07/27/2019  . Breast cancer screening by mammogram 06/07/2019  . Seasonal allergic rhinitis due to pollen 06/07/2019  . Colon cancer screening 06/07/2019  . Spinal stenosis, lumbar region, with neurogenic claudication 03/16/2019    Class: Chronic  . Status post lumbar laminectomy 03/16/2019  . Acute stress reaction 09/25/2018  . Primary osteoarthritis of right hip 04/27/2018  . Status post total hip replacement, 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  . Shortness of breath 01/02/2018  . Chalazion left upper eyelid 02/20/2017  . Vitamin D insufficiency 11/25/2013  . Healthcare maintenance 11/25/2013  . Spinal  stenosis of lumbar region 07/02/2013  . Arthritis, degenerative 11/17/2012  . Arthrosis of right  acromioclavicular joint 06/05/2012  . Rectocele 04/28/2012  . Left knee DJD, degenerative meniscus tear 04/06/2012  . Numbness and tingling in hands 03/17/2012  . Periodontitis 09/23/2011  . Depression 09/23/2011  . Right knee meniscal tear 03/14/2011  . Lumbar radiculopathy 03/14/2011  . Stress incontinence of urine 10/18/2010  . NECK PAIN, CHRONIC 08/19/2008  . Normocytic anemia 05/12/2008  . SHOULDER PAIN, RIGHT 04/28/2008  . Hyperlipemia 06/18/2007  . Essential hypertension 05/20/2007     Olivia Ewing, PT, DPT 11/25/19 12:42 PM  [facility] [address] [place], [place], [address] Phone: [phone]   Fax:  [phone]  Name: Olivia Ewing MRN: [id number] Date of Birth: [birth year]

## 2019-12-02 ENCOUNTER — Other Ambulatory Visit: Payer: Self-pay

## 2019-12-02 ENCOUNTER — Ambulatory Visit: Payer: Medicare HMO

## 2019-12-02 DIAGNOSIS — M25611 Stiffness of right shoulder, not elsewhere classified: Secondary | ICD-10-CM

## 2019-12-02 DIAGNOSIS — M25511 Pain in right shoulder: Secondary | ICD-10-CM

## 2019-12-02 NOTE — Therapy (Signed)
[place], [place], [address] Phone: [phone]   Fax:  [phone]  Physical Therapy Treatment  Patient Details  Name: Olivia Ewing MRN: [id number] Date of Birth: [birth year] Referring Provider (PT): Leandrew Koyanagi, MD   Encounter Date: 12/02/2019   PT End of Session - 12/02/19 1210    Visit Number 6    Number of Visits 16    Date for PT Re-Evaluation 01/14/20    Authorization Type [employer] MCR/MCD - FOTO at visit 10    Progress Note Due on Visit 10    PT Start Time 1211    PT Stop Time 1256    PT Time Calculation (min) 45 min    Activity Tolerance Patient tolerated treatment well    Behavior During Therapy [facility] for tasks assessed/performed           Past Medical History:  Diagnosis Date  . Anemia   . Arthritis    knees hands  . Bipolar disorder ([place])   . Cervicalgia   . Depression   . Environmental allergies    cause SOB, uses inhaler for  . Environmental and seasonal allergies    uses inhaler prn  . Full dentures   . GERD (gastroesophageal reflux disease)    diet controlled - no meds  . Hyperlipidemia    diet controlled, no meds  . Hypertension   . Lumbar radiculopathy, chronic   . Right knee pain    posterior horn medial meniscal tear MRI 2013  . Spinal stenosis    getting epidural injections -last one 06/12/2015  . SVD (spontaneous vaginal delivery)    x 4    Past Surgical History:  Procedure Laterality Date  . APPENDECTOMY    . Bladder tack  10/2006   cystocoele  . BREAST SURGERY Right    benign cyst  . CARPAL TUNNEL RELEASE Left 10/25/2019   Procedure: LEFT CARPAL TUNNEL RELEASE;  Surgeon: Jessy Oto, MD;  Location: [place];  Service: Orthopedics;  Laterality: Left;  . COLONOSCOPY    . Hysterectomy other    . LUMBAR LAMINECTOMY N/A 03/16/2019   Procedure: CENTRAL LAMINECTOMIES L2-3, L3-4 AND L4-5;  Surgeon: Jessy Oto, MD;  Location: [place];   Service: Orthopedics;  Laterality: N/A;  . TOTAL HIP ARTHROPLASTY Right 04/27/2018   Procedure: RIGHT TOTAL HIP ARTHROPLASTY ANTERIOR APPROACH;  Surgeon: Leandrew Koyanagi, MD;  Location: [place];  Service: Orthopedics;  Laterality: Right;  . TUBAL LIGATION      There were no vitals filed for this visit.   Subjective Assessment - 12/02/19 1210    Subjective Patient states "This bone or knot right here starts to really ache sometimes and it is worse depending on the weather or if I reach a certain way" pointing to R AC joint. Pt reports wanting to contact her doctor today to request a follow up for increased comfort/state of mind with the progress of her R shoulder rehab. Pt explains having difficulty with reaching into washer/dryer but has had less difficulty with most other ADL.    Limitations Lifting;House hold activities    Patient Stated Goals Putting on shirt without pain, lifting and carrying, sleep better    Currently in Pain? Yes    Pain Score 5     Pain Location Shoulder    Pain Orientation Right;Proximal    Pain Descriptors / Indicators Aching;Sore    Pain Type Surgical pain;Chronic pain  Pain Onset More than a month ago              [facility] PT Assessment - 12/02/19 0001      Assessment   Medical Diagnosis S/p right shoulder scope biceps tenotomy    Referring Provider (PT) Leandrew Koyanagi, MD      Observation/Other Assessments   Focus on Therapeutic Outcomes (FOTO)  36% limited   62% limited 09/24/19; 43% limited 11/19/19;predicted 39% limit                        [facility] Adult PT Treatment/Exercise - 12/02/19 0001      Self-Care   Self-Care Other Self-Care Comments    Other Self-Care Comments  HEP, anatomy of AC joint due to TTP at distal clavicle, FOTO score and progress in therapy      Shoulder Exercises: Supine   External Rotation AAROM    Flexion AAROM;Both;15 reps    Shoulder Flexion Weight (lbs) 5# on cane for FL and chest press    Other Supine  Exercises serratus punches with cane 15x with 5#      Shoulder Exercises: Standing   External Rotation Strengthening;Right;20 reps    Theraband Level (Shoulder External Rotation) Level 2 (Red)    Internal Rotation Strengthening;Right;20 reps    Theraband Level (Shoulder Internal Rotation) Level 2 (Red)    Extension Strengthening;Both;15 reps;Theraband    Theraband Level (Shoulder Extension) Level 3 (Green)    Row Strengthening;20 reps;Right    Theraband Level (Shoulder Row) Level 2 (Red)    Other Standing Exercises Wall walks into R shoulder ABD with 10 sec stretch at end x 3      Shoulder Exercises: ROM/Strengthening   UBE (Upper Arm Bike) L3 x 3 min forward, 3 min backward      Manual Therapy   Manual Therapy Soft tissue mobilization;Joint mobilization    Joint Mobilization Gentle inferior and posterior mobilization to tolerance of distal clavicle on acromion    Soft tissue mobilization R anterior shoulder STM                  PT Education - 12/02/19 1244    Education Details HEP, anatomy of AC joint due to TTP at distal clavicle (showed pt an image of AC joint for reference), FOTO score and progress in therapy    Person(s) Educated Patient    Methods Explanation;Demonstration;Tactile cues;Verbal cues    Comprehension Verbalized understanding;Returned demonstration            PT Short Term Goals - 11/19/19 1148      PT SHORT TERM GOAL #1   Title Pt will be independent with initial HEP    Baseline needed cues    Status On-going      PT SHORT TERM GOAL #2   Title Pt will be able to perform full shoulder ROM without pain    Status On-going      PT SHORT TERM GOAL #3   Title Pt will be able to self correct posture without PT cueing    Status On-going             PT Long Term Goals - 12/02/19 1319      PT LONG TERM GOAL #1   Title Pt will be independent with advanced HEP    Status On-going      PT LONG TERM GOAL #2   Title Pt will improve shoulder  strength to be able to lift  at least 5#s overhead    Status On-going      PT LONG TERM GOAL #3   Title Pt will be able to carry at least 10 lbs without pain    Status On-going      PT LONG TERM GOAL #4   Title Pt will be able to dress herself without pain    Status On-going      PT LONG TERM GOAL #5   Title Pt will improve FOTO score to at least 39%    Status Achieved                 Plan - 12/02/19 1217    Clinical Impression Statement Pt tolerated treatment session well and expressed ease of symptoms following manual therapy and interventions. Pt experienced TTP and pain at R acromioclavicular joint that was eased following inferior and posterior mobilizations to distal clavicle. Pt demonstrated decreased pain with active R shoulder IR, horizontal ABD, and elevation after mobilization of distal clavicle. Pt's FOTO score today revealed 36% limitation compared to a 43% limitation on 11/19/2019.    Personal Factors and Comorbidities Age;Comorbidity 1;Comorbidity 2;Time since onset of injury/illness/exacerbation;Comorbidity 3+    Comorbidities arthritis, HTN, back pain, CTS surgery    Examination-Activity Limitations Reach Overhead;Caring for Others;Carry;Dressing;Lift    Examination-Participation Restrictions Community Activity;Cleaning;Occupation;Interpersonal Relationship;Meal Prep    Rehab Potential Good    PT Frequency 2x / week    PT Duration 8 weeks    PT Treatment/Interventions ADLs/Self Care Home Management;Cryotherapy;Electrical Stimulation;Iontophoresis 4mg /ml Dexamethasone;Moist Heat;Ultrasound;Functional mobility training;Therapeutic activities;Therapeutic exercise;Neuromuscular re-education;Patient/family education;Manual techniques;Passive range of motion;Dry needling;Taping;Vasopneumatic Device    PT Next Visit Plan Continue AAROM, AROM, and strength as tolerated (increase resistance from red to green theraband again if tolerated); AC joint mobilizations as tolerated if  still indicated    PT [place] and Agree with Plan of Care Patient           Patient will benefit from skilled therapeutic intervention in order to improve the following deficits and impairments:  Decreased coordination, Decreased range of motion, Increased fascial restricitons, Impaired UE functional use, Impaired perceived functional ability, Pain, Improper body mechanics, Decreased mobility, Decreased strength, Postural dysfunction  Visit Diagnosis: Right shoulder pain, unspecified chronicity  Stiffness of right shoulder, not elsewhere classified     Problem List Patient Active Problem List   Diagnosis Date Noted  . Carpal tunnel syndrome, left upper limb 10/25/2019    Class: Chronic  . Carpal tunnel syndrome, right upper limb 10/25/2019    Class: Chronic  . Nontraumatic incomplete tear of right rotator cuff 07/27/2019  . Tendinopathy of right biceps tendon 07/27/2019  . Breast cancer screening by mammogram 06/07/2019  . Seasonal allergic rhinitis due to pollen 06/07/2019  . Colon cancer screening 06/07/2019  . Spinal stenosis, lumbar region, with neurogenic claudication 03/16/2019    Class: Chronic  . Status post lumbar laminectomy 03/16/2019  . Acute stress reaction 09/25/2018  . Primary osteoarthritis of right hip 04/27/2018  . Status post total hip replacement, 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  . Shortness of breath 01/02/2018  . Chalazion left upper eyelid 02/20/2017  . Vitamin D insufficiency 11/25/2013  . Healthcare maintenance 11/25/2013  . Spinal stenosis of lumbar region 07/02/2013  . Arthritis, degenerative 11/17/2012  . Arthrosis of right acromioclavicular joint 06/05/2012  . Rectocele 04/28/2012  . Left knee DJD, degenerative meniscus tear 04/06/2012  . Numbness and tingling in hands 03/17/2012  . Periodontitis 09/23/2011  . Depression 09/23/2011  .  Right knee meniscal tear 03/14/2011  . Lumbar radiculopathy 03/14/2011  . Stress  incontinence of urine 10/18/2010  . NECK PAIN, CHRONIC 08/19/2008  . Normocytic anemia 05/12/2008  . SHOULDER PAIN, RIGHT 04/28/2008  . Hyperlipemia 06/18/2007  . Essential hypertension 05/20/2007     Haydee Monica, PT, DPT 12/02/19 1:27 PM  [place] [address] [place], [place], [address] Phone: [phone]   Fax:  [phone]  Name: Olivia Ewing MRN: [id number] Date of Birth: Jan 20, 1951

## 2019-12-05 ENCOUNTER — Encounter: Payer: Self-pay | Admitting: Orthopaedic Surgery

## 2019-12-06 ENCOUNTER — Telehealth: Payer: Self-pay | Admitting: Orthopaedic Surgery

## 2019-12-06 NOTE — Telephone Encounter (Signed)
Pt called stating sh has a vein that protruding and it's causing her pain; pt would like to know if she can get a rx called in to last her until 12/10/19?  [phone]

## 2019-12-07 NOTE — Telephone Encounter (Signed)
Where is this vein?

## 2019-12-08 ENCOUNTER — Telehealth: Payer: Self-pay | Admitting: Orthopaedic Surgery

## 2019-12-08 NOTE — Telephone Encounter (Signed)
Called patient no answer LMOM to return our call. See message below. She has appt on Friday.

## 2019-12-08 NOTE — Telephone Encounter (Signed)
Patient called. She says the shoulder she had surgery on has a bone sticking out and causing pain. She would like hydrocodone called in to [facility] on [address].

## 2019-12-09 ENCOUNTER — Other Ambulatory Visit: Payer: Self-pay | Admitting: Physician Assistant

## 2019-12-09 MED ORDER — HYDROCODONE-ACETAMINOPHEN 5-325 MG PO TABS: 1.0000 | ORAL_TABLET | Freq: Every day | ORAL | 0 refills | Status: DC | PRN

## 2019-12-09 NOTE — Telephone Encounter (Signed)
Patient aware. She has appt scheduled for tomorrow.

## 2019-12-09 NOTE — Telephone Encounter (Signed)
We can call in one small rx for norco.  If she truly thinks she has a bone sticking out, which is unlikely, she needs to come in

## 2019-12-10 ENCOUNTER — Ambulatory Visit: Payer: Medicare HMO | Admitting: Physical Therapy

## 2019-12-10 ENCOUNTER — Other Ambulatory Visit: Payer: Self-pay | Admitting: Specialist

## 2019-12-10 ENCOUNTER — Ambulatory Visit: Payer: Medicare HMO | Admitting: Orthopaedic Surgery

## 2019-12-10 DIAGNOSIS — M25561 Pain in right knee: Secondary | ICD-10-CM

## 2019-12-10 DIAGNOSIS — M1711 Unilateral primary osteoarthritis, right knee: Secondary | ICD-10-CM

## 2019-12-10 DIAGNOSIS — M25562 Pain in left knee: Secondary | ICD-10-CM

## 2019-12-10 NOTE — Telephone Encounter (Signed)
She needs to have a separate appointment with me because appointment with Louanne Skye are too unpredictable in terms of timing

## 2019-12-14 ENCOUNTER — Telehealth: Payer: Self-pay

## 2019-12-14 NOTE — Telephone Encounter (Signed)
She is almost 4 months out from shouulder surgery so we cannot refill narcotics.  Happy to call in tramadol or tylenol 3 if she would like

## 2019-12-14 NOTE — Telephone Encounter (Signed)
FYI  Called patient to advise on message  below. She states " I cant take Tramadol, BYE!" and hung up on me.

## 2019-12-14 NOTE — Telephone Encounter (Signed)
Patient called in for refill on hydrocodone   [facility] [address]

## 2019-12-14 NOTE — Telephone Encounter (Signed)
ok 

## 2019-12-14 NOTE — Telephone Encounter (Signed)
See message below. Last Rx filled 12/09/2019. She canceled her previous appt. Next appt scheduled for 12/21/2019.

## 2019-12-16 ENCOUNTER — Ambulatory Visit: Payer: Medicare HMO | Admitting: Specialist

## 2019-12-17 ENCOUNTER — Ambulatory Visit: Payer: Medicare HMO | Admitting: Physical Therapy

## 2019-12-20 ENCOUNTER — Encounter: Payer: Self-pay | Admitting: Orthopaedic Surgery

## 2019-12-21 ENCOUNTER — Ambulatory Visit (INDEPENDENT_AMBULATORY_CARE_PROVIDER_SITE_OTHER): Payer: Medicare HMO

## 2019-12-21 ENCOUNTER — Ambulatory Visit (INDEPENDENT_AMBULATORY_CARE_PROVIDER_SITE_OTHER): Payer: Medicare HMO | Admitting: Orthopaedic Surgery

## 2019-12-21 ENCOUNTER — Encounter: Payer: Self-pay | Admitting: Orthopaedic Surgery

## 2019-12-21 ENCOUNTER — Other Ambulatory Visit: Payer: Self-pay

## 2019-12-21 DIAGNOSIS — Z9889 Other specified postprocedural states: Secondary | ICD-10-CM | POA: Diagnosis not present

## 2019-12-21 MED ORDER — HYDROCODONE-ACETAMINOPHEN 7.5-325 MG PO TABS
1.0000 | ORAL_TABLET | Freq: Two times a day (BID) | ORAL | 0 refills | Status: DC | PRN
Start: 2019-12-21 — End: 2020-01-05

## 2019-12-21 NOTE — Progress Notes (Signed)
Office Visit Note   Patient: Olivia Ewing           Date of Birth: 1950/12/26           MRN: [id number] Visit Date: 12/21/2019              Requested by: [name], MD [address] [place],  [place] [address] PCP: [name], MD   Assessment & Plan: Visit Diagnoses:  1. S/P arthroscopy of right shoulder     Plan: Impression is status post right shoulder scope with painful knot of the AC joint.  We will need to obtain MRI of the right shoulder to fully evaluate this lesion.  Follow-up after the MRI.  Follow-Up Instructions: Return if symptoms worsen or fail to improve.   Orders:  Orders Placed This Encounter  Procedures   XR AC Joints   Meds ordered this encounter  Medications   HYDROcodone-acetaminophen (NORCO) 7.5-325 MG tablet    Sig: Take 1 tablet by mouth 2 (two) times daily as needed for moderate pain.    Dispense:  20 tablet    Refill:  0      Procedures: No procedures performed   Clinical Data: No additional findings.   Subjective: Chief Complaint  Patient presents with   Right Shoulder - Routine Post Op, Pain    Olivia Ewing comes in today for evaluation of a painful knot on the right shoulder.  She is a about 3 and half month status post right shoulder arthroscopy including distal clavicle excision.  She noticed that a painful knot developed over the [facility] joint about 3 weeks ago.  Denies any injuries or trauma.  She has been using hot and cold compresses.  Has tried over-the-counter medications but they do not help.  She has pain when reaching overhead.  Denies any constitutional symptoms.   Review of Systems  Constitutional: Negative.   HENT: Negative.   Eyes: Negative.   Respiratory: Negative.   Cardiovascular: Negative.   Endocrine: Negative.   Musculoskeletal: Negative.   Neurological: Negative.   Hematological: Negative.   Psychiatric/Behavioral: Negative.   All other systems reviewed and are negative.    Objective: Vital  Signs: There were no vitals taken for this visit.  Physical Exam Vitals and nursing note reviewed.  Constitutional:      Appearance: She is well-developed.  Pulmonary:     Effort: Pulmonary effort is normal.  Skin:    General: Skin is warm.     Capillary Refill: Capillary refill takes less than 2 seconds.  Neurological:     Mental Status: She is alert and oriented to person, place, and time.  Psychiatric:        Behavior: Behavior normal.        Thought Content: Thought content normal.        Judgment: Judgment normal.     Ortho Exam Right shoulder shows tender painful knot over the [facility] joint.  She has normal range of motion with pain at the extremes of elevation and forward flexion.  There is no evidence of infection.  The knot is firm.  It is not mobile. Specialty Comments:  No specialty comments available.  Imaging: XR AC Joints  Result Date: 12/21/2019 No acute abnormalities.  Degenerative changes of the [facility] joint.    PMFS History: Patient Active Problem List   Diagnosis Date Noted   Carpal tunnel syndrome, left upper limb 10/25/2019    Class: Chronic   Carpal tunnel syndrome, right upper  limb 10/25/2019    Class: Chronic   Nontraumatic incomplete tear of right rotator cuff 07/27/2019   Tendinopathy of right biceps tendon 07/27/2019   Breast cancer screening by mammogram 06/07/2019   Seasonal allergic rhinitis due to pollen 06/07/2019   Colon cancer screening 06/07/2019   Spinal stenosis, lumbar region, with neurogenic claudication 03/16/2019    Class: Chronic   Status post lumbar laminectomy 03/16/2019   Acute stress reaction 09/25/2018   Primary osteoarthritis of right hip 04/27/2018   Status post total hip replacement, 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   Shortness of breath 01/02/2018   Chalazion left upper eyelid 02/20/2017   Vitamin D insufficiency 11/25/2013   Healthcare maintenance 11/25/2013   Spinal stenosis of lumbar region 07/02/2013   Arthritis,  degenerative 11/17/2012   Arthrosis of right acromioclavicular joint 06/05/2012   Rectocele 04/28/2012   Left knee DJD, degenerative meniscus tear 04/06/2012   Numbness and tingling in hands 03/17/2012   Periodontitis 09/23/2011   Depression 09/23/2011   Right knee meniscal tear 03/14/2011   Lumbar radiculopathy 03/14/2011   Stress incontinence of urine 10/18/2010   NECK PAIN, CHRONIC 08/19/2008   Normocytic anemia 05/12/2008   SHOULDER PAIN, RIGHT 04/28/2008   Hyperlipemia 06/18/2007   Essential hypertension 05/20/2007   Past Medical History:  Diagnosis Date   Anemia    Arthritis    knees hands   Bipolar disorder (HCC)    Cervicalgia    Depression    Environmental allergies    cause SOB, uses inhaler for   Environmental and seasonal allergies    uses inhaler prn   Full dentures    GERD (gastroesophageal reflux disease)    diet controlled - no meds   Hyperlipidemia    diet controlled, no meds   Hypertension    Lumbar radiculopathy, chronic    Right knee pain    posterior horn medial meniscal tear MRI 2013   Spinal stenosis    getting epidural injections -last one 06/12/2015   SVD (spontaneous vaginal delivery)    x 4    Family History  Problem Relation Age of Onset   Hypertension Mother     Past Surgical History:  Procedure Laterality Date   APPENDECTOMY     Bladder tack  10/2006   cystocoele   BREAST SURGERY Right    benign cyst   CARPAL TUNNEL RELEASE Left 10/25/2019   Procedure: LEFT CARPAL TUNNEL RELEASE;  Surgeon: Jessy Oto, MD;  Location: [place];  Service: Orthopedics;  Laterality: Left;   COLONOSCOPY     Hysterectomy other     LUMBAR LAMINECTOMY N/A 03/16/2019   Procedure: CENTRAL LAMINECTOMIES L2-3, L3-4 AND L4-5;  Surgeon: Jessy Oto, MD;  Location: [place];  Service: Orthopedics;  Laterality: N/A;   TOTAL HIP ARTHROPLASTY Right 04/27/2018   Procedure: RIGHT TOTAL HIP ARTHROPLASTY ANTERIOR  APPROACH;  Surgeon: Leandrew Koyanagi, MD;  Location: [place];  Service: Orthopedics;  Laterality: Right;   TUBAL LIGATION     Social History   Occupational History   Not on file  Tobacco Use   Smoking status: Former Smoker    Packs/day: 0.10    Quit date: 11/26/1983    Years since quitting: 36.0   Smokeless tobacco: Never Used  Vaping Use   Vaping Use: Never used  Substance and Sexual Activity   Alcohol use: Yes    Alcohol/week: 0.0 standard drinks    Comment: rarely   Drug use: Not Currently    Types: Marijuana  Comment: last used 2019   Sexual activity: Not Currently    Birth control/protection: Surgical

## 2019-12-21 NOTE — Addendum Note (Signed)
Addended by: Precious Bard on: 12/21/2019 04:52 PM   Modules accepted: Orders

## 2019-12-22 ENCOUNTER — Telehealth: Payer: Self-pay | Admitting: Orthopaedic Surgery

## 2019-12-22 NOTE — Telephone Encounter (Signed)
Called patient 1X and left vm for pt to call and set MRI appt with Dr. Erlinda Hong after 01/03/20.

## 2019-12-29 ENCOUNTER — Ambulatory Visit: Payer: Medicare HMO | Attending: Orthopaedic Surgery

## 2019-12-29 ENCOUNTER — Other Ambulatory Visit: Payer: Self-pay

## 2019-12-29 DIAGNOSIS — M25611 Stiffness of right shoulder, not elsewhere classified: Secondary | ICD-10-CM | POA: Insufficient documentation

## 2019-12-29 DIAGNOSIS — M25511 Pain in right shoulder: Secondary | ICD-10-CM | POA: Insufficient documentation

## 2019-12-29 NOTE — Therapy (Addendum)
[place], [place], [address] Phone: [phone]   Fax:  [phone]  Physical Therapy Treatment/Discharge Summary   Patient Details  Name: Olivia Ewing MRN: [id number] Date of Birth: 1950/04/07 Referring Provider (PT): Leandrew Koyanagi, MD   Encounter Date: 12/29/2019   PT End of Session - 12/29/19 1101    Visit Number 6    Number of Visits 16    Date for PT Re-Evaluation 01/14/20    Authorization Type [employer] MCR/MCD - FOTO at visit 10    Progress Note Due on Visit 10    PT Start Time 1045    PT Stop Time 1058    PT Time Calculation (min) 13 min    Activity Tolerance Patient tolerated treatment well    Behavior During Therapy [facility] for tasks assessed/performed           Past Medical History:  Diagnosis Date  . Anemia   . Arthritis    knees hands  . Bipolar disorder ([place])   . Cervicalgia   . Depression   . Environmental allergies    cause SOB, uses inhaler for  . Environmental and seasonal allergies    uses inhaler prn  . Full dentures   . GERD (gastroesophageal reflux disease)    diet controlled - no meds  . Hyperlipidemia    diet controlled, no meds  . Hypertension   . Lumbar radiculopathy, chronic   . Right knee pain    posterior horn medial meniscal tear MRI 2013  . Spinal stenosis    getting epidural injections -last one 06/12/2015  . SVD (spontaneous vaginal delivery)    x 4    Past Surgical History:  Procedure Laterality Date  . APPENDECTOMY    . Bladder tack  10/2006   cystocoele  . BREAST SURGERY Right    benign cyst  . CARPAL TUNNEL RELEASE Left 10/25/2019   Procedure: LEFT CARPAL TUNNEL RELEASE;  Surgeon: Jessy Oto, MD;  Location: [place];  Service: Orthopedics;  Laterality: Left;  . COLONOSCOPY    . Hysterectomy other    . LUMBAR LAMINECTOMY N/A 03/16/2019   Procedure: CENTRAL LAMINECTOMIES L2-3, L3-4 AND L4-5;  Surgeon: Jessy Oto, MD;   Location: [place];  Service: Orthopedics;  Laterality: N/A;  . TOTAL HIP ARTHROPLASTY Right 04/27/2018   Procedure: RIGHT TOTAL HIP ARTHROPLASTY ANTERIOR APPROACH;  Surgeon: Leandrew Koyanagi, MD;  Location: [place];  Service: Orthopedics;  Laterality: Right;  . TUBAL LIGATION      There were no vitals filed for this visit.   Subjective Assessment - 12/29/19 1102    Subjective Pt reports she continued to have discomfort along "knot" at R [facility] joint and did follow up with her doctor since her previous PT session. The pt states that she has a cyst in that area and has an MRI scheduled for next week (01/03/2020) for further examination. Pt reports the cyst has grown in size and become more painful over the past several weeks, and she has limited mobility in R shoulder due to significant increase in pain and discomfort. PT and pt decided to hold off on PT pending MRI results and due to pt's inability to perform interventions secondary to pain from cyst.    Limitations Lifting;House hold activities    Patient Stated Goals Putting on shirt without pain, lifting and carrying, sleep better    Pain Onset More than a month ago  PT Short Term Goals - 11/19/19 1148      PT SHORT TERM GOAL #1   Title Pt will be independent with initial HEP    Baseline needed cues    Status On-going      PT SHORT TERM GOAL #2   Title Pt will be able to perform full shoulder ROM without pain    Status On-going      PT SHORT TERM GOAL #3   Title Pt will be able to self correct posture without PT cueing    Status On-going             PT Long Term Goals - 12/02/19 1319      PT LONG TERM GOAL #1   Title Pt will be independent with advanced HEP    Status On-going      PT LONG TERM GOAL #2   Title Pt will improve shoulder strength to be able to lift at least 5#s overhead    Status On-going      PT LONG TERM GOAL #3   Title Pt will be able to carry at  least 10 lbs without pain    Status On-going      PT LONG TERM GOAL #4   Title Pt will be able to dress herself without pain    Status On-going      PT LONG TERM GOAL #5   Title Pt will improve FOTO score to at least 39%    Status Achieved                  Patient will benefit from skilled therapeutic intervention in order to improve the following deficits and impairments:     Visit Diagnosis: Right shoulder pain, unspecified chronicity  Stiffness of right shoulder, not elsewhere classified     Problem List Patient Active Problem List   Diagnosis Date Noted  . Carpal tunnel syndrome, left upper limb 10/25/2019    Class: Chronic  . Carpal tunnel syndrome, right upper limb 10/25/2019    Class: Chronic  . Nontraumatic incomplete tear of right rotator cuff 07/27/2019  . Tendinopathy of right biceps tendon 07/27/2019  . Breast cancer screening by mammogram 06/07/2019  . Seasonal allergic rhinitis due to pollen 06/07/2019  . Colon cancer screening 06/07/2019  . Spinal stenosis, lumbar region, with neurogenic claudication 03/16/2019    Class: Chronic  . Status post lumbar laminectomy 03/16/2019  . Acute stress reaction 09/25/2018  . Primary osteoarthritis of right hip 04/27/2018  . Status post total hip replacement, 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  . Shortness of breath 01/02/2018  . Chalazion left upper eyelid 02/20/2017  . Vitamin D insufficiency 11/25/2013  . Healthcare maintenance 11/25/2013  . Spinal stenosis of lumbar region 07/02/2013  . Arthritis, degenerative 11/17/2012  . Arthrosis of right acromioclavicular joint 06/05/2012  . Rectocele 04/28/2012  . Left knee DJD, degenerative meniscus tear 04/06/2012  . Numbness and tingling in hands 03/17/2012  . Periodontitis 09/23/2011  . Depression 09/23/2011  . Right knee meniscal tear 03/14/2011  . Lumbar radiculopathy 03/14/2011  . Stress incontinence of urine 10/18/2010  . NECK PAIN, CHRONIC 08/19/2008  . Normocytic  anemia 05/12/2008  . SHOULDER PAIN, RIGHT 04/28/2008  . Hyperlipemia 06/18/2007  . Essential hypertension 05/20/2007    PHYSICAL THERAPY DISCHARGE SUMMARY  Visits from Start of Care: 6  Current functional level related to goals / functional outcomes: See above   Remaining deficits: See above   Education / Equipment: See above  Plan: Patient  agrees to discharge.  Patient goals were not met. Patient is being discharged due to the patient's request.  ?????        Returned to doctor for further examination. Debridement scheduled for 02/21/2020.  Haydee Monica, PT, DPT 02/15/20 3:44 PM  [facility] [address] [place], [place], [address] Phone: [phone]   Fax:  [phone]  Name: Olivia Ewing MRN: [id number] Date of Birth: 02/25/1950

## 2020-01-03 ENCOUNTER — Ambulatory Visit
Admission: RE | Admit: 2020-01-03 | Discharge: 2020-01-03 | Disposition: A | Discharge status: Home / Self Care | Payer: Medicare HMO | Source: Ambulatory Visit | Attending: Orthopaedic Surgery | Admitting: Orthopaedic Surgery

## 2020-01-03 ENCOUNTER — Telehealth: Payer: Self-pay

## 2020-01-03 ENCOUNTER — Ambulatory Visit: Payer: Medicare HMO

## 2020-01-03 ENCOUNTER — Other Ambulatory Visit: Payer: Self-pay

## 2020-01-03 ENCOUNTER — Encounter: Payer: Self-pay | Admitting: Orthopaedic Surgery

## 2020-01-03 DIAGNOSIS — Z9889 Other specified postprocedural states: Secondary | ICD-10-CM

## 2020-01-03 NOTE — Telephone Encounter (Signed)
Ok to call in tramadol 50 mg one every 6 hours #30  cannot do anythign stronger

## 2020-01-03 NOTE — Telephone Encounter (Signed)
Sent her mychart msg to see if she wants Tramadol.

## 2020-01-03 NOTE — Telephone Encounter (Signed)
Patient called in wanting to get refill on pain medication says she is still having pain in shoulder .  [facility] [address]

## 2020-01-04 NOTE — Telephone Encounter (Signed)
Can we put tramadol allergy in chart?  Can she take tylenol #3?

## 2020-01-05 ENCOUNTER — Ambulatory Visit: Payer: Self-pay

## 2020-01-05 ENCOUNTER — Encounter: Payer: Self-pay | Admitting: Orthopaedic Surgery

## 2020-01-05 ENCOUNTER — Other Ambulatory Visit: Payer: Self-pay | Admitting: Physician Assistant

## 2020-01-05 ENCOUNTER — Other Ambulatory Visit: Payer: Self-pay | Admitting: Internal Medicine

## 2020-01-05 ENCOUNTER — Ambulatory Visit (INDEPENDENT_AMBULATORY_CARE_PROVIDER_SITE_OTHER): Payer: Medicare HMO | Admitting: Orthopaedic Surgery

## 2020-01-05 ENCOUNTER — Ambulatory Visit: Payer: Medicare HMO

## 2020-01-05 DIAGNOSIS — F32A Depression, unspecified: Secondary | ICD-10-CM

## 2020-01-05 DIAGNOSIS — Z9889 Other specified postprocedural states: Secondary | ICD-10-CM | POA: Diagnosis not present

## 2020-01-05 DIAGNOSIS — F430 Acute stress reaction: Secondary | ICD-10-CM

## 2020-01-05 DIAGNOSIS — M25511 Pain in right shoulder: Secondary | ICD-10-CM | POA: Diagnosis not present

## 2020-01-05 MED ORDER — HYDROCODONE-ACETAMINOPHEN 5-325 MG PO TABS
1.0000 | ORAL_TABLET | Freq: Every day | ORAL | 0 refills | Status: DC | PRN
Start: 2020-01-05 — End: 2020-01-05

## 2020-01-05 MED ORDER — HYDROCODONE-ACETAMINOPHEN 7.5-325 MG PO TABS
1.0000 | ORAL_TABLET | Freq: Two times a day (BID) | ORAL | 0 refills | Status: DC | PRN
Start: 2020-01-05 — End: 2020-02-10

## 2020-01-05 NOTE — Progress Notes (Signed)
Subjective: She is here for ultrasound-guided right shoulder subacromial bursa injection as well as attempted aspiration of AC joint cyst.  Procedure: Ultrasound-guided right shoulder injection: After sterile prep with Betadine, injected 5 cc 1% lidocaine without epinephrine and 40 mg methylprednisolone into the subacromial/subdeltoid bursa using ultrasound to guide needle placement.  Then injected 3 cc 1% lidocaine without epinephrine and guided an 18-gauge needle into the [facility] joint nodule, which was actually hyperechoic without a significant fluid collection to be aspirated.  Injected 40 mg of the prednisolone into it.  She will follow-up with Dr. Erlinda Hong as scheduled.

## 2020-01-05 NOTE — Progress Notes (Signed)
Office Visit Note   Patient: Olivia Ewing           Date of Birth: 27-Apr-1950           MRN: [id number] Visit Date: 01/05/2020              Requested by: [name], MD [address] [place],  [place] [address] PCP: [name], MD   Assessment & Plan: Visit Diagnoses:  1. S/P arthroscopy of right shoulder     Plan: MRI of the right shoulder reviewed with the patient in detail which shows a new small full-thickness defect at the anterior portion of the proximal supraspinatus tendon.  There is unchanged severe tendinosis of the supraspinatus and infraspinatus.  She has a fair amount of subacromial bursitis and fluid which communicates with the fluid collection above the [facility] joint.  She has degenerative findings of the glenohumeral joint as well as the labrum.  Given these findings and based on discussion patient would like to try aspiration of the cyst as well as an ultrasound-guided subacromial injection today.  We will have her follow-up in about 4 weeks to see how she response to this injection.  Follow-Up Instructions: Return in about 4 weeks (around 02/02/2020).   Orders:  Orders Placed This Encounter  Procedures  . US Guided Needle Placement - No Linked Charges   No orders of the defined types were placed in this encounter.     Procedures: No procedures performed   Clinical Data: No additional findings.   Subjective: Chief Complaint  Patient presents with  . Right Shoulder - Pain    [name] is here to review right shoulder MRI.  She continues to be in pain with activities above her head.   Review of Systems  Constitutional: Negative.   HENT: Negative.   Eyes: Negative.   Respiratory: Negative.   Cardiovascular: Negative.   Endocrine: Negative.   Musculoskeletal: Negative.   Neurological: Negative.   Hematological: Negative.   Psychiatric/Behavioral: Negative.   All other systems reviewed and are negative.    Objective: Vital Signs: There  were no vitals taken for this visit.  Physical Exam Vitals and nursing note reviewed.  Constitutional:      Appearance: She is well-developed.  Pulmonary:     Effort: Pulmonary effort is normal.  Skin:    General: Skin is warm.     Capillary Refill: Capillary refill takes less than 2 seconds.  Neurological:     Mental Status: She is alert and oriented to person, place, and time.  Psychiatric:        Behavior: Behavior normal.        Thought Content: Thought content normal.        Judgment: Judgment normal.     Ortho Exam Right shoulder is stable.  The cyst is still palpable above the [facility] joint.  She is able to raise her arm fully without any shrugging compensation but there is moderate to severe pain.  Strength is overall slightly decreased secondary to the pain. Specialty Comments:  No specialty comments available.  Imaging: US Guided Needle Placement - No Linked Charges  Result Date: 01/05/2020 Ultrasound-guided right shoulder injection: After sterile prep with Betadine, injected 5 cc 1% lidocaine without epinephrine and 40 mg methylprednisolone into the subacromial/subdeltoid bursa using ultrasound to guide needle placement.  Then injected 3 cc 1% lidocaine without epinephrine and guided an 18-gauge needle into the [facility] joint nodule, which was actually hyperechoic without a significant fluid  collection to be aspirated.  Injected 40 mg of the prednisolone into it.  She will follow-up with Dr. Erlinda Hong as scheduled.     PMFS History: Patient Active Problem List   Diagnosis Date Noted  . S/P arthroscopy of right shoulder 01/05/2020  . Carpal tunnel syndrome, left upper limb 10/25/2019    Class: Chronic  . Carpal tunnel syndrome, right upper limb 10/25/2019    Class: Chronic  . Nontraumatic incomplete tear of right rotator cuff 07/27/2019  . Tendinopathy of right biceps tendon 07/27/2019  . Breast cancer screening by mammogram 06/07/2019  . Seasonal allergic rhinitis due to pollen  06/07/2019  . Colon cancer screening 06/07/2019  . Spinal stenosis, lumbar region, with neurogenic claudication 03/16/2019    Class: Chronic  . Status post lumbar laminectomy 03/16/2019  . Acute stress reaction 09/25/2018  . Primary osteoarthritis of right hip 04/27/2018  . Status post total hip replacement, 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  . Shortness of breath 01/02/2018  . Chalazion left upper eyelid 02/20/2017  . Vitamin D insufficiency 11/25/2013  . Healthcare maintenance 11/25/2013  . Spinal stenosis of lumbar region 07/02/2013  . Arthritis, degenerative 11/17/2012  . Arthrosis of right acromioclavicular joint 06/05/2012  . Rectocele 04/28/2012  . Left knee DJD, degenerative meniscus tear 04/06/2012  . Numbness and tingling in hands 03/17/2012  . Periodontitis 09/23/2011  . Depression 09/23/2011  . Right knee meniscal tear 03/14/2011  . Lumbar radiculopathy 03/14/2011  . Stress incontinence of urine 10/18/2010  . NECK PAIN, CHRONIC 08/19/2008  . Normocytic anemia 05/12/2008  . SHOULDER PAIN, RIGHT 04/28/2008  . Hyperlipemia 06/18/2007  . Essential hypertension 05/20/2007   Past Medical History:  Diagnosis Date  . Anemia   . Arthritis    knees hands  . Bipolar disorder ([place])   . Cervicalgia   . Depression   . Environmental allergies    cause SOB, uses inhaler for  . Environmental and seasonal allergies    uses inhaler prn  . Full dentures   . GERD (gastroesophageal reflux disease)    diet controlled - no meds  . Hyperlipidemia    diet controlled, no meds  . Hypertension   . Lumbar radiculopathy, chronic   . Right knee pain    posterior horn medial meniscal tear MRI 2013  . Spinal stenosis    getting epidural injections -last one 06/12/2015  . SVD (spontaneous vaginal delivery)    x 4    Family History  Problem Relation Age of Onset  . Hypertension Mother     Past Surgical History:  Procedure Laterality Date  . APPENDECTOMY    . Bladder tack  10/2006   cystocoele  .  BREAST SURGERY Right    benign cyst  . CARPAL TUNNEL RELEASE Left 10/25/2019   Procedure: LEFT CARPAL TUNNEL RELEASE;  Surgeon: Jessy Oto, MD;  Location: [place];  Service: Orthopedics;  Laterality: Left;  . COLONOSCOPY    . Hysterectomy other    . LUMBAR LAMINECTOMY N/A 03/16/2019   Procedure: CENTRAL LAMINECTOMIES L2-3, L3-4 AND L4-5;  Surgeon: Jessy Oto, MD;  Location: [place];  Service: Orthopedics;  Laterality: N/A;  . TOTAL HIP ARTHROPLASTY Right 04/27/2018   Procedure: RIGHT TOTAL HIP ARTHROPLASTY ANTERIOR APPROACH;  Surgeon: Leandrew Koyanagi, MD;  Location: [place];  Service: Orthopedics;  Laterality: Right;  . TUBAL LIGATION     Social History   Occupational History  . Not on file  Tobacco Use  . Smoking status: Former Smoker  Packs/day: 0.10    Quit date: 11/26/1983    Years since quitting: 36.1  . Smokeless tobacco: Never Used  Vaping Use  . Vaping Use: Never used  Substance and Sexual Activity  . Alcohol use: Yes    Alcohol/week: 0.0 standard drinks    Comment: rarely  . Drug use: Not Currently    Types: Marijuana    Comment: last used 2019  . Sexual activity: Not Currently    Birth control/protection: Surgical

## 2020-01-05 NOTE — Telephone Encounter (Signed)
I just sent in norco

## 2020-01-12 ENCOUNTER — Encounter: Payer: Medicare HMO | Admitting: Student

## 2020-01-13 ENCOUNTER — Other Ambulatory Visit: Payer: Self-pay | Admitting: *Deleted

## 2020-01-13 DIAGNOSIS — D508 Other iron deficiency anemias: Secondary | ICD-10-CM

## 2020-01-13 MED ORDER — FERROUS GLUCONATE 324 (38 FE) MG PO TABS: 324.0000 mg | ORAL_TABLET | Freq: Every day | ORAL | 0 refills | Status: DC

## 2020-01-13 NOTE — Telephone Encounter (Signed)
Next appt scheduled 03/10/19 with PCP. 

## 2020-01-17 ENCOUNTER — Encounter: Payer: Self-pay | Admitting: Specialist

## 2020-01-17 ENCOUNTER — Ambulatory Visit (INDEPENDENT_AMBULATORY_CARE_PROVIDER_SITE_OTHER): Payer: Medicare HMO | Admitting: Specialist

## 2020-01-17 VITALS — BP 150/101 | HR 98 | Ht 65.0 in | Wt 182.2 lb

## 2020-01-17 DIAGNOSIS — M25462 Effusion, left knee: Secondary | ICD-10-CM

## 2020-01-17 DIAGNOSIS — Z9889 Other specified postprocedural states: Secondary | ICD-10-CM

## 2020-01-17 DIAGNOSIS — G5603 Carpal tunnel syndrome, bilateral upper limbs: Secondary | ICD-10-CM

## 2020-01-17 DIAGNOSIS — M25432 Effusion, left wrist: Secondary | ICD-10-CM

## 2020-01-17 DIAGNOSIS — M25461 Effusion, right knee: Secondary | ICD-10-CM

## 2020-01-17 MED ORDER — GABAPENTIN 300 MG PO CAPS: 300.0000 mg | ORAL_CAPSULE | Freq: Every day | ORAL | 3 refills | Status: DC

## 2020-01-17 MED ORDER — METHYLPREDNISOLONE 4 MG PO TBPK: ORAL_TABLET | ORAL | 0 refills | Status: DC

## 2020-01-17 NOTE — Progress Notes (Signed)
Office Visit Note   Patient: Olivia Ewing           Date of Birth: [birth year]           MRN: [id number] Visit Date: 01/17/2020              Requested by: [name], MD [address] [place],  [place] [address] PCP: [name], MD   Assessment & Plan: Visit Diagnoses:  1. S/P carpal tunnel release   2. Bilateral carpal tunnel syndrome   3. Swelling of wrist joint, left   4. Bilateral knee swelling     Plan: Plan: Knee right shoulder and left wrist are suffering from inflamation and arthritis, only real proven treatments for knee arthritis are Weight loss, NSIADs like diclofenac and exercise. Well padded shoes help. Ice the knee that is suffering from osteoarthritis, only real proven treatments are Ordered a steroid dose pak. Place you on Gabapentin 300 mg po at night.  Well padded shoes help. Ice the knee 2-3 times a day 15-20 mins at a time.-3 times a day 15-20 mins at a time. Hot showers in the AM.  Injection with steroid may be of benefit. Hemp CBD capsules, amazon.com 5,000-7,000 mg per bottle, 60 capsules per bottle, take one capsule twice a day. Cane in the left hand to use with left leg weight bearing. Follow-Up Instructions: No follow-ups on file.    Follow-Up Instructions: Return in about 3 weeks (around 02/07/2020).   Orders:  Orders Placed This Encounter  Procedures  . MR Wrist Left w/o contrast  . Sed Rate (ESR)  . C-reactive protein  . Rheumatoid Factor  . ANA w/Reflex   Meds ordered this encounter  Medications  . gabapentin (NEURONTIN) 300 MG capsule    Sig: Take 1 capsule (300 mg total) by mouth at bedtime.    Dispense:  30 capsule    Refill:  3  . methylPREDNISolone (MEDROL DOSEPAK) 4 MG TBPK tablet    Sig: Take 6 day dose pack as directed.    Dispense:  21 tablet    Refill:  0      Procedures: No procedures performed   Clinical Data: No additional findings.   Subjective: Chief Complaint  Patient presents with  . Left  Wrist - Follow-up    Carpal Tunnel Release on 10/25/19    69 year old right handed handed female s/p right shoulder distal clavicle resection and post left CTR in 10/25/2019. She has pain associated with the right shoulder still with a lump over the right distal clavicle at the [facility] joint. No pain in the back like before. The back where she has had surgery is uncomfortable but pain is better. Sharp pain is gone just with prolong standing there is pain. Taking nothing for pain, tylenol doesn't help. No bowel or bladder difficulties. There is numbness and tingling into the right hand and on the left. There is residual swelling in the left wrist palm and she reports that she hurts but has a high pain tolerance.   Review of Systems  Constitutional: Negative.   HENT: Negative.   Eyes: Negative.   Respiratory: Negative.   Cardiovascular: Negative.   Gastrointestinal: Negative.   Endocrine: Negative.   Genitourinary: Negative.   Musculoskeletal: Negative.   Skin: Negative.   Allergic/Immunologic: Negative.   Neurological: Negative.   Hematological: Negative.   Psychiatric/Behavioral: Negative.      Objective: Vital Signs: BP (!) 150/101 (BP Location: Left Arm, Patient Position: Sitting)  Pulse 98   Ht _0  (1.651 m)   Wt 182 lb 3.2 oz (82.6 kg)   BMI 30.32 kg/m   Physical Exam Constitutional:      Appearance: She is well-developed.  HENT:     Head: Normocephalic and atraumatic.  Eyes:     Pupils: Pupils are equal, round, and reactive to light.  Pulmonary:     Effort: Pulmonary effort is normal.     Breath sounds: Normal breath sounds.  Abdominal:     General: Bowel sounds are normal.     Palpations: Abdomen is soft.  Musculoskeletal:     Cervical back: Normal range of motion and neck supple.  Skin:    General: Skin is warm and dry.  Neurological:     Mental Status: She is alert and oriented to person, place, and time.  Psychiatric:        Behavior: Behavior normal.         Thought Content: Thought content normal.        Judgment: Judgment normal.     Right Hand Exam   Tenderness  The patient is experiencing tenderness in the palmar area.  Range of Motion  Wrist  Extension: abnormal  Flexion: abnormal  Pronation: normal  Supination: normal   Muscle Strength  Wrist extension: 4/5  Wrist flexion: 4/5  Grip: 4/5   Tests  Phalen's sign: positive Tinel's sign (median nerve): positive  Other  Erythema: absent Scars: present Sensation: normal Pulse: present   Left Shoulder Exam   Tenderness  The patient is experiencing tenderness in the acromioclavicular joint.  Range of Motion  Active abduction: abnormal  Passive abduction: abnormal  Extension: abnormal  External rotation: abnormal  Forward flexion: normal  Internal rotation 0 degrees: normal  Internal rotation 90 degrees: normal   Muscle Strength  Abduction: 4/5  Internal rotation: 4/5  External rotation: 4/5  Supraspinatus: 4/5  Subscapularis: 4/5   Tests  Impingement: positive  Other  Erythema: absent Scars: present Sensation: normal Pulse: present   Comments:  Nodule right anterior AC joint with thickness and tenderness.       Specialty Comments:  No specialty comments available.  Imaging: No results found.   PMFS History: Patient Active Problem List   Diagnosis Date Noted  . Carpal tunnel syndrome, left upper limb 10/25/2019    Priority: High    Class: Chronic  . Spinal stenosis, lumbar region, with neurogenic claudication 03/16/2019    Priority: High    Class: Chronic  . Carpal tunnel syndrome, right upper limb 10/25/2019    Priority: Low    Class: Chronic  . S/P arthroscopy of right shoulder 01/05/2020  . Nontraumatic incomplete tear of right rotator cuff 07/27/2019  . Tendinopathy of right biceps tendon 07/27/2019  . Breast cancer screening by mammogram 06/07/2019  . Seasonal allergic rhinitis due to pollen 06/07/2019  . Colon cancer  screening 06/07/2019  . Status post lumbar laminectomy 03/16/2019  . Acute stress reaction 09/25/2018  . Primary osteoarthritis of right hip 04/27/2018  . Status post total hip replacement, 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  . Shortness of breath 01/02/2018  . Chalazion left upper eyelid 02/20/2017  . Vitamin D insufficiency 11/25/2013  . Healthcare maintenance 11/25/2013  . Spinal stenosis of lumbar region 07/02/2013  . Arthritis, degenerative 11/17/2012  . Arthrosis of right acromioclavicular joint 06/05/2012  . Rectocele 04/28/2012  . Left knee DJD, degenerative meniscus tear 04/06/2012  . Numbness and tingling in hands 03/17/2012  . Periodontitis 09/23/2011  .  Depression 09/23/2011  . Right knee meniscal tear 03/14/2011  . Lumbar radiculopathy 03/14/2011  . Stress incontinence of urine 10/18/2010  . NECK PAIN, CHRONIC 08/19/2008  . Normocytic anemia 05/12/2008  . SHOULDER PAIN, RIGHT 04/28/2008  . Hyperlipemia 06/18/2007  . Essential hypertension 05/20/2007   Past Medical History:  Diagnosis Date  . Anemia   . Arthritis    knees hands  . Bipolar disorder ([place])   . Cervicalgia   . Depression   . Environmental allergies    cause SOB, uses inhaler for  . Environmental and seasonal allergies    uses inhaler prn  . Full dentures   . GERD (gastroesophageal reflux disease)    diet controlled - no meds  . Hyperlipidemia    diet controlled, no meds  . Hypertension   . Lumbar radiculopathy, chronic   . Right knee pain    posterior horn medial meniscal tear MRI 2013  . Spinal stenosis    getting epidural injections -last one 06/12/2015  . SVD (spontaneous vaginal delivery)    x 4    Family History  Problem Relation Age of Onset  . Hypertension Mother     Past Surgical History:  Procedure Laterality Date  . APPENDECTOMY    . Bladder tack  10/2006   cystocoele  . BREAST SURGERY Right    benign cyst  . CARPAL TUNNEL RELEASE Left 10/25/2019   Procedure: LEFT CARPAL TUNNEL RELEASE;   Surgeon: Jessy Oto, MD;  Location: [facility];  Service: Orthopedics;  Laterality: Left;  . COLONOSCOPY    . Hysterectomy other    . LUMBAR LAMINECTOMY N/A 03/16/2019   Procedure: CENTRAL LAMINECTOMIES L2-3, L3-4 AND L4-5;  Surgeon: Jessy Oto, MD;  Location: [facility];  Service: Orthopedics;  Laterality: N/A;  . TOTAL HIP ARTHROPLASTY Right 04/27/2018   Procedure: RIGHT TOTAL HIP ARTHROPLASTY ANTERIOR APPROACH;  Surgeon: Leandrew Koyanagi, MD;  Location: [facility];  Service: Orthopedics;  Laterality: Right;  . TUBAL LIGATION     Social History   Occupational History  . Not on file  Tobacco Use  . Smoking status: Former Smoker    Packs/day: 0.10    Quit date: 11/26/1983    Years since quitting: 36.1  . Smokeless tobacco: Never Used  Vaping Use  . Vaping Use: Never used  Substance and Sexual Activity  . Alcohol use: Yes    Alcohol/week: 0.0 standard drinks    Comment: rarely  . Drug use: Not Currently    Types: Marijuana    Comment: last used 2019  . Sexual activity: Not Currently    Birth control/protection: Surgical

## 2020-01-17 NOTE — Patient Instructions (Signed)
Plan: Knee right shoulder and left wrist are suffering from inflamation and arthritis, only real proven treatments for knee arthritis are Weight loss, NSIADs like diclofenac and exercise. Well padded shoes help. Ice the knee that is suffering from osteoarthritis, only real proven treatments are Ordered a steroid dose pak. Place you on Gabapentin 300 mg po at night.  Well padded shoes help. Ice the knee 2-3 times a day 15-20 mins at a time.-3 times a day 15-20 mins at a time. Hot showers in the AM.  Injection with steroid may be of benefit. Hemp CBD capsules, amazon.com 5,000-7,000 mg per bottle, 60 capsules per bottle, take one capsule twice a day. Cane in the left hand to use with left leg weight bearing. Follow-Up Instructions: No follow-ups on file.

## 2020-01-18 ENCOUNTER — Other Ambulatory Visit: Payer: Self-pay | Admitting: Student

## 2020-01-18 ENCOUNTER — Ambulatory Visit: Payer: Medicare HMO

## 2020-01-18 ENCOUNTER — Other Ambulatory Visit: Payer: Self-pay | Admitting: *Deleted

## 2020-01-18 ENCOUNTER — Encounter: Payer: Medicare HMO | Admitting: Student

## 2020-01-18 ENCOUNTER — Other Ambulatory Visit: Payer: Self-pay

## 2020-01-18 DIAGNOSIS — I10 Essential (primary) hypertension: Secondary | ICD-10-CM

## 2020-01-18 MED ORDER — LISINOPRIL-HYDROCHLOROTHIAZIDE 20-12.5 MG PO TABS: 1.0000 | ORAL_TABLET | Freq: Every day | ORAL | 0 refills | Status: DC

## 2020-01-18 NOTE — Telephone Encounter (Signed)
I will refill patient's medication for an additional month, however she needs to come to clinic for evaluation on December 21st. She was scheduled for an appointment with me today, however she cancelled. This has been her third consecutive cancellation with me.

## 2020-01-18 NOTE — Telephone Encounter (Signed)
lisinopril-hydrochlorothiazide (ZESTORETIC) 20-12.5 MG tablet, REFILL REQUEST @  [facility], [place] Phone:  [phone]  Fax:  [phone]     Pt states she is out meds for five days, pt would like this med by today.

## 2020-01-19 ENCOUNTER — Encounter: Payer: Self-pay | Admitting: Orthopaedic Surgery

## 2020-01-19 ENCOUNTER — Telehealth: Payer: Self-pay | Admitting: Orthopaedic Surgery

## 2020-01-19 NOTE — Telephone Encounter (Signed)
Pt called stating she got surgery with Dr.Xu on her shoulder and the level of pain she's in now is worse then before the surgery. Pt states it feels like the bone is going to come out and she isn't able to move her arm; pt would only like to speak with Dr. Erlinda Hong and she would like a CB as soon as he has a moment.  [phone]

## 2020-01-19 NOTE — Telephone Encounter (Signed)
No answer

## 2020-01-20 ENCOUNTER — Other Ambulatory Visit: Payer: Self-pay | Admitting: Internal Medicine

## 2020-01-21 ENCOUNTER — Telehealth: Payer: Self-pay

## 2020-01-21 ENCOUNTER — Other Ambulatory Visit: Payer: Self-pay

## 2020-01-21 ENCOUNTER — Encounter: Payer: Self-pay | Admitting: Orthopaedic Surgery

## 2020-01-21 ENCOUNTER — Ambulatory Visit (INDEPENDENT_AMBULATORY_CARE_PROVIDER_SITE_OTHER): Payer: Medicare HMO | Admitting: Surgical

## 2020-01-21 DIAGNOSIS — Z9889 Other specified postprocedural states: Secondary | ICD-10-CM

## 2020-01-21 DIAGNOSIS — M25511 Pain in right shoulder: Secondary | ICD-10-CM

## 2020-01-21 MED ORDER — COLCHICINE 0.6 MG PO TABS: 0.6000 mg | ORAL_TABLET | Freq: Two times a day (BID) | ORAL | 0 refills | Status: DC

## 2020-01-21 NOTE — Telephone Encounter (Signed)
Patient called again in tears because she states the "knot" is draining after her sister squeezed it She wants you to call her

## 2020-01-21 NOTE — Telephone Encounter (Signed)
Called and left voicemail for patient.  I recommended that she be evaluated by someone in this office instead of exchanging messages on epic.

## 2020-01-21 NOTE — Telephone Encounter (Signed)
That's fine. Please work her in. At 1pm

## 2020-01-21 NOTE — Telephone Encounter (Signed)
Would luke be willing to see her today?

## 2020-01-23 ENCOUNTER — Encounter: Payer: Self-pay | Admitting: Surgical

## 2020-01-23 NOTE — Progress Notes (Signed)
Office Visit Note   Patient: Olivia Ewing           Date of Birth: 1950/07/30           MRN: [id number] Visit Date: 01/21/2020 Requested by: [name], MD [address] [place],  [place] [address] PCP: [name], MD  Subjective: Chief Complaint  Patient presents with  . Right Shoulder - Pain    08/25/2019 right shoulder arthroscopy with debridement, SAD, DCE-Dr. Erlinda Hong    HPI: Olivia Ewing is a 69 y.o. female who presents to the office complaining of right shoulder pain.  Patient is s/p right shoulder arthroscopy with subacromial decompression and distal clavicle excision by Dr. Lula Olszewski on 08/25/2019.  She was recently seen by Dr Erlinda Hong on 01/05/2020 with complaints of worsening shoulder pain and cyst formation at the anterior superior aspect of the right shoulder.  She had MRI of the right shoulder that was reviewed at that appointment that revealed moderate amount of subacromial bursitis and fluid throughout the glenohumeral joint which communicated with the fluid in the [facility] joint and above the [facility] joint and the cyst formation.  On aspiration of the cyst was attempted by Dr. Junius Roads but nothing was aspirated and the cyst was injected with cortisone.  Patient notes that cortisone injection only helped for about 1 day but now pain has progressed and the cyst has spontaneously ruptured.  She has pictures of the drainage which appears bloody and with small tophi.  Drainage does not seem purulent.  She denies any fevers, chills, night sweats, malaise.  She denies any history of gout.                ROS: All systems reviewed are negative as they relate to the chief complaint within the history of present illness.  Patient denies fevers or chills.  Assessment & Plan: Visit Diagnoses:  1. S/P arthroscopy of right shoulder     Plan: Patient is a 69 year old female who presents complaining of continued right shoulder pain.  She had a recent surgery by Dr. Erlinda Hong with distal clavicle excision and  subacromial decompression arthroscopically on 08/25/2019.  She has had right shoulder cyst that has been evaluated that seems to be fluid from the glenohumeral joint that communicates with the [facility] joint and with the cyst just above the [facility] joint.  The cyst recently ruptured with drainage that looks to be bloody drainage with small tophi based on the photographs she has taken.  Differential diagnosis includes gout versus pseudogout versus septic joint.  Discussed options available to patient.  Options include joint aspiration with fluid analysis versus oral colchicine with blood work.  Recommended colchicine with CBC, CRP, ESR, uric acid.  She declined any blood work and just wants to try the colchicine.  Plan for patient to follow-up with Dr. Erlinda Hong next week for clinical reevaluation.  Prescribed colchicine.  She is also taking prednisone that she received from Dr. Louanne Skye on 01/17/2020 that should also provide some relief for gout symptoms.  Follow-Up Instructions: No follow-ups on file.   Orders:  No orders of the defined types were placed in this encounter.  Meds ordered this encounter  Medications  . colchicine 0.6 MG tablet    Sig: Take 1 tablet (0.6 mg total) by mouth 2 (two) times daily.    Dispense:  12 tablet    Refill:  0      Procedures: No procedures performed   Clinical Data: No additional findings.  Objective: Vital Signs: There were no vitals taken for this visit.  Physical Exam:  Constitutional: Patient appears well-developed HEENT:  Head: Normocephalic Eyes:EOM are normal Neck: Normal range of motion Cardiovascular: Normal rate Pulmonary/chest: Effort normal Neurologic: Patient is alert Skin: Skin is warm Psychiatric: Patient has normal mood and affect  Ortho Exam: Ortho exam demonstrates right shoulder with pain with any passive range of motion.  No significantly increased warmth of the right shoulder joint compared with the contralateral side.  No significant warmth or  fluctuance of the right shoulder cyst at the superior aspect of the right shoulder.  Moderate tenderness palpation over the right shoulder cyst.  No drainage is expressible.  Not actively draining.    Specialty Comments:  No specialty comments available.  Imaging: No results found.   PMFS History: Patient Active Problem List   Diagnosis Date Noted  . S/P arthroscopy of right shoulder 01/05/2020  . Carpal tunnel syndrome, left upper limb 10/25/2019    Class: Chronic  . Carpal tunnel syndrome, right upper limb 10/25/2019    Class: Chronic  . Nontraumatic incomplete tear of right rotator cuff 07/27/2019  . Tendinopathy of right biceps tendon 07/27/2019  . Breast cancer screening by mammogram 06/07/2019  . Seasonal allergic rhinitis due to pollen 06/07/2019  . Colon cancer screening 06/07/2019  . Spinal stenosis, lumbar region, with neurogenic claudication 03/16/2019    Class: Chronic  . Status post lumbar laminectomy 03/16/2019  . Acute stress reaction 09/25/2018  . Primary osteoarthritis of right hip 04/27/2018  . Status post total hip replacement, 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  . Shortness of breath 01/02/2018  . Chalazion left upper eyelid 02/20/2017  . Vitamin D insufficiency 11/25/2013  . Healthcare maintenance 11/25/2013  . Spinal stenosis of lumbar region 07/02/2013  . Arthritis, degenerative 11/17/2012  . Arthrosis of right acromioclavicular joint 06/05/2012  . Rectocele 04/28/2012  . Left knee DJD, degenerative meniscus tear 04/06/2012  . Numbness and tingling in hands 03/17/2012  . Periodontitis 09/23/2011  . Depression 09/23/2011  . Right knee meniscal tear 03/14/2011  . Lumbar radiculopathy 03/14/2011  . Stress incontinence of urine 10/18/2010  . NECK PAIN, CHRONIC 08/19/2008  . Normocytic anemia 05/12/2008  . SHOULDER PAIN, RIGHT 04/28/2008  . Hyperlipemia 06/18/2007  . Essential hypertension 05/20/2007   Past Medical History:  Diagnosis Date  . Anemia   . Arthritis     knees hands  . Bipolar disorder ([place])   . Cervicalgia   . Depression   . Environmental allergies    cause SOB, uses inhaler for  . Environmental and seasonal allergies    uses inhaler prn  . Full dentures   . GERD (gastroesophageal reflux disease)    diet controlled - no meds  . Hyperlipidemia    diet controlled, no meds  . Hypertension   . Lumbar radiculopathy, chronic   . Right knee pain    posterior horn medial meniscal tear MRI 2013  . Spinal stenosis    getting epidural injections -last one 06/12/2015  . SVD (spontaneous vaginal delivery)    x 4    Family History  Problem Relation Age of Onset  . Hypertension Mother     Past Surgical History:  Procedure Laterality Date  . APPENDECTOMY    . Bladder tack  10/2006   cystocoele  . BREAST SURGERY Right    benign cyst  . CARPAL TUNNEL RELEASE Left 10/25/2019   Procedure: LEFT CARPAL TUNNEL RELEASE;  Surgeon: Jessy Oto, MD;  Location: [facility];  Service: Orthopedics;  Laterality: Left;  . COLONOSCOPY    . Hysterectomy other    . LUMBAR LAMINECTOMY N/A 03/16/2019   Procedure: CENTRAL LAMINECTOMIES L2-3, L3-4 AND L4-5;  Surgeon: Jessy Oto, MD;  Location: [facility];  Service: Orthopedics;  Laterality: N/A;  . TOTAL HIP ARTHROPLASTY Right 04/27/2018   Procedure: RIGHT TOTAL HIP ARTHROPLASTY ANTERIOR APPROACH;  Surgeon: Leandrew Koyanagi, MD;  Location: [facility];  Service: Orthopedics;  Laterality: Right;  . TUBAL LIGATION     Social History   Occupational History  . Not on file  Tobacco Use  . Smoking status: Former Smoker    Packs/day: 0.10    Quit date: 11/26/1983    Years since quitting: 36.1  . Smokeless tobacco: Never Used  Vaping Use  . Vaping Use: Never used  Substance and Sexual Activity  . Alcohol use: Yes    Alcohol/week: 0.0 standard drinks    Comment: rarely  . Drug use: Not Currently    Types: Marijuana    Comment: last used 2019  . Sexual activity: Not Currently    Birth  control/protection: Surgical

## 2020-01-26 ENCOUNTER — Encounter: Payer: Self-pay | Admitting: Orthopaedic Surgery

## 2020-01-26 ENCOUNTER — Ambulatory Visit: Payer: Medicare HMO | Admitting: Orthopaedic Surgery

## 2020-01-28 ENCOUNTER — Encounter: Payer: Self-pay | Admitting: Physical Therapy

## 2020-01-29 ENCOUNTER — Other Ambulatory Visit: Payer: Self-pay | Admitting: Internal Medicine

## 2020-01-29 DIAGNOSIS — J301 Allergic rhinitis due to pollen: Secondary | ICD-10-CM

## 2020-01-29 DIAGNOSIS — D508 Other iron deficiency anemias: Secondary | ICD-10-CM

## 2020-02-01 ENCOUNTER — Encounter: Payer: Medicare HMO | Admitting: Student

## 2020-02-01 ENCOUNTER — Other Ambulatory Visit: Payer: Self-pay | Admitting: *Deleted

## 2020-02-01 DIAGNOSIS — E782 Mixed hyperlipidemia: Secondary | ICD-10-CM

## 2020-02-01 MED ORDER — ROSUVASTATIN CALCIUM 20 MG PO TABS: 20.0000 mg | ORAL_TABLET | Freq: Every day | ORAL | 1 refills | Status: DC

## 2020-02-02 ENCOUNTER — Ambulatory Visit: Payer: Medicare HMO | Admitting: Orthopaedic Surgery

## 2020-02-03 ENCOUNTER — Telehealth: Payer: Self-pay | Admitting: *Deleted

## 2020-02-03 NOTE — Telephone Encounter (Signed)
Received order and CMN from [facility] for Incontinence Supplies. Patient last seen for this 03/10/2019. Will forward to FO to assist patient in scheduling appt to discuss need. This may be done by telehealth. Hubbard Hartshorn, BSN, RN-BC

## 2020-02-07 ENCOUNTER — Other Ambulatory Visit: Payer: Medicare HMO

## 2020-02-07 ENCOUNTER — Encounter: Payer: Self-pay | Admitting: Student

## 2020-02-10 ENCOUNTER — Ambulatory Visit (INDEPENDENT_AMBULATORY_CARE_PROVIDER_SITE_OTHER): Payer: Medicare HMO | Admitting: Orthopaedic Surgery

## 2020-02-10 ENCOUNTER — Other Ambulatory Visit: Payer: Self-pay | Admitting: Physician Assistant

## 2020-02-10 ENCOUNTER — Other Ambulatory Visit: Payer: Self-pay

## 2020-02-10 ENCOUNTER — Encounter: Payer: Self-pay | Admitting: Orthopaedic Surgery

## 2020-02-10 DIAGNOSIS — M71011 Abscess of bursa, right shoulder: Secondary | ICD-10-CM

## 2020-02-10 MED ORDER — HYDROCODONE-ACETAMINOPHEN 5-325 MG PO TABS
1.0000 | ORAL_TABLET | Freq: Two times a day (BID) | ORAL | 0 refills | Status: DC | PRN
Start: 2020-02-10 — End: 2020-02-10

## 2020-02-10 MED ORDER — HYDROCODONE-ACETAMINOPHEN 5-325 MG PO TABS: 1.0000 | ORAL_TABLET | Freq: Two times a day (BID) | ORAL | 0 refills | Status: DC | PRN

## 2020-02-10 NOTE — Progress Notes (Signed)
Office Visit Note   Patient: Olivia Ewing           Date of Birth: Mar 22, 1950           MRN: [id number] Visit Date: 02/10/2020              Requested by: [name], MD [address] [place],  [place] [address] PCP: [name], MD   Assessment & Plan: Visit Diagnoses:  1. Abscess of bursa of right shoulder     Plan: Impression is right shoulder mass possible abscess versus gouty manifestation.  At this point conservative treatment measures have been unsuccessful therefore I recommended formal incision and drainage and cultures and debridement in the operating room so that we can better decipher what is actually going on.  She is agreeable to this.  We will plan for this in the near future.  Questions encouraged and answered.  Follow-Up Instructions: Return if symptoms worsen or fail to improve.   Orders:  No orders of the defined types were placed in this encounter.  Meds ordered this encounter  Medications   DISCONTD: HYDROcodone-acetaminophen (NORCO) 5-325 MG tablet    Sig: Take 1-2 tablets by mouth 2 (two) times daily as needed.    Dispense:  20 tablet    Refill:  0      Procedures: No procedures performed   Clinical Data: No additional findings.   Subjective: Chief Complaint  Patient presents with   Right Shoulder - Routine Post Op, Follow-up    Olivia Ewing returns today for continued drainage and pain overlying the right shoulder mass.  She states that the drainage is a white pasty material.  Denies any constitutional symptoms.  She has pain directly over the mass with any use of the arm.  Prior attempts to aspirate this mass was unsuccessful by Dr. Junius Roads.  Symptoms have not improved since been placed on colchicine.   Review of Systems  Constitutional: Negative.   HENT: Negative.   Eyes: Negative.   Respiratory: Negative.   Cardiovascular: Negative.   Endocrine: Negative.   Musculoskeletal: Negative.   Neurological: Negative.   Hematological:  Negative.   Psychiatric/Behavioral: Negative.   All other systems reviewed and are negative.    Objective: Vital Signs: There were no vitals taken for this visit.  Physical Exam Vitals and nursing note reviewed.  Constitutional:      Appearance: She is well-developed and well-nourished.  Pulmonary:     Effort: Pulmonary effort is normal.  Skin:    General: Skin is warm.     Capillary Refill: Capillary refill takes less than 2 seconds.  Neurological:     Mental Status: She is alert and oriented to person, place, and time.  Psychiatric:        Mood and Affect: Mood and affect normal.        Behavior: Behavior normal.        Thought Content: Thought content normal.        Judgment: Judgment normal.     Ortho Exam Right shoulder shows fluctuant mass on the superior aspect of the AC joint.  There are no open wounds or drainage.  She has pain directly over this area with palpation as well as elevation of the arm.  There is no cellulitis or warmth or skin changes. Specialty Comments:  No specialty comments available.  Imaging: No results found.   PMFS History: Patient Active Problem List   Diagnosis Date Noted   Abscess of bursa of  right shoulder 02/10/2020   S/P arthroscopy of right shoulder 01/05/2020   Carpal tunnel syndrome, left upper limb 10/25/2019    Class: Chronic   Carpal tunnel syndrome, right upper limb 10/25/2019    Class: Chronic   Nontraumatic incomplete tear of right rotator cuff 07/27/2019   Tendinopathy of right biceps tendon 07/27/2019   Breast cancer screening by mammogram 06/07/2019   Seasonal allergic rhinitis due to pollen 06/07/2019   Colon cancer screening 06/07/2019   Spinal stenosis, lumbar region, with neurogenic claudication 03/16/2019    Class: Chronic   Status post lumbar laminectomy 03/16/2019   Acute stress reaction 09/25/2018   Primary osteoarthritis of right hip 04/27/2018   Status post total hip replacement, right  04/27/2018   Shortness of breath 01/02/2018   Chalazion left upper eyelid 02/20/2017   Vitamin D insufficiency 11/25/2013   Healthcare maintenance 11/25/2013   Spinal stenosis of lumbar region 07/02/2013   Arthritis, degenerative 11/17/2012   Arthrosis of right acromioclavicular joint 06/05/2012   Rectocele 04/28/2012   Left knee DJD, degenerative meniscus tear 04/06/2012   Numbness and tingling in hands 03/17/2012   Periodontitis 09/23/2011   Depression 09/23/2011   Right knee meniscal tear 03/14/2011   Lumbar radiculopathy 03/14/2011   Stress incontinence of urine 10/18/2010   NECK PAIN, CHRONIC 08/19/2008   Normocytic anemia 05/12/2008   SHOULDER PAIN, RIGHT 04/28/2008   Hyperlipemia 06/18/2007   Essential hypertension 05/20/2007   Past Medical History:  Diagnosis Date   Anemia    Arthritis    knees hands   Bipolar disorder (HCC)    Cervicalgia    Depression    Environmental allergies    cause SOB, uses inhaler for   Environmental and seasonal allergies    uses inhaler prn   Full dentures    GERD (gastroesophageal reflux disease)    diet controlled - no meds   Hyperlipidemia    diet controlled, no meds   Hypertension    Lumbar radiculopathy, chronic    Right knee pain    posterior horn medial meniscal tear MRI 2013   Spinal stenosis    getting epidural injections -last one 06/12/2015   SVD (spontaneous vaginal delivery)    x 4    Family History  Problem Relation Age of Onset   Hypertension Mother     Past Surgical History:  Procedure Laterality Date   APPENDECTOMY     Bladder tack  10/2006   cystocoele   BREAST SURGERY Right    benign cyst   CARPAL TUNNEL RELEASE Left 10/25/2019   Procedure: LEFT CARPAL TUNNEL RELEASE;  Surgeon: Kerrin Champagne, MD;  Location: [facility];  Service: Orthopedics;  Laterality: Left;   COLONOSCOPY     Hysterectomy other     LUMBAR LAMINECTOMY N/A 03/16/2019   Procedure:  CENTRAL LAMINECTOMIES L2-3, L3-4 AND L4-5;  Surgeon: Kerrin Champagne, MD;  Location: [facility];  Service: Orthopedics;  Laterality: N/A;   TOTAL HIP ARTHROPLASTY Right 04/27/2018   Procedure: RIGHT TOTAL HIP ARTHROPLASTY ANTERIOR APPROACH;  Surgeon: Tarry Kos, MD;  Location: [facility] OR;  Service: Orthopedics;  Laterality: Right;   TUBAL LIGATION     Social History   Occupational History   Not on file  Tobacco Use   Smoking status: Former Smoker    Packs/day: 0.10    Quit date: 11/26/1983    Years since quitting: 36.2   Smokeless tobacco: Never Used  Vaping Use   Vaping Use: Never used  Substance and Sexual Activity   Alcohol use: Yes    Alcohol/week: 0.0 standard drinks    Comment: rarely   Drug use: Not Currently    Types: Marijuana    Comment: last used 2019   Sexual activity: Not Currently    Birth control/protection: Surgical

## 2020-02-14 ENCOUNTER — Other Ambulatory Visit: Payer: Self-pay

## 2020-02-14 ENCOUNTER — Encounter (HOSPITAL_BASED_OUTPATIENT_CLINIC_OR_DEPARTMENT_OTHER): Payer: Self-pay | Admitting: Orthopaedic Surgery

## 2020-02-14 ENCOUNTER — Encounter: Payer: Self-pay | Admitting: Orthopaedic Surgery

## 2020-02-14 NOTE — Telephone Encounter (Signed)
Looks like she is now scheduled to see Dr. Laural Benes next month.

## 2020-02-17 ENCOUNTER — Other Ambulatory Visit (HOSPITAL_COMMUNITY): Payer: Medicare HMO

## 2020-02-18 ENCOUNTER — Ambulatory Visit (INDEPENDENT_AMBULATORY_CARE_PROVIDER_SITE_OTHER): Payer: Medicare HMO | Admitting: Orthopaedic Surgery

## 2020-02-18 ENCOUNTER — Other Ambulatory Visit: Payer: Self-pay | Admitting: Physician Assistant

## 2020-02-18 ENCOUNTER — Encounter: Payer: Self-pay | Admitting: Physician Assistant

## 2020-02-18 ENCOUNTER — Encounter (HOSPITAL_BASED_OUTPATIENT_CLINIC_OR_DEPARTMENT_OTHER)
Admission: RE | Admit: 2020-02-18 | Discharge: 2020-02-18 | Disposition: A | Discharge status: Home / Self Care | Payer: Medicare HMO | Source: Ambulatory Visit | Attending: Orthopaedic Surgery | Admitting: Orthopaedic Surgery

## 2020-02-18 ENCOUNTER — Ambulatory Visit (INDEPENDENT_AMBULATORY_CARE_PROVIDER_SITE_OTHER): Payer: Medicare HMO

## 2020-02-18 ENCOUNTER — Other Ambulatory Visit (HOSPITAL_COMMUNITY)
Admission: RE | Admit: 2020-02-18 | Discharge: 2020-02-18 | Disposition: A | Discharge status: Home / Self Care | Payer: Medicare HMO | Source: Ambulatory Visit | Attending: Orthopaedic Surgery | Admitting: Orthopaedic Surgery

## 2020-02-18 ENCOUNTER — Encounter: Payer: Self-pay | Admitting: Surgical

## 2020-02-18 VITALS — BP 149/86 | HR 103 | Temp 99.4°F

## 2020-02-18 DIAGNOSIS — Z20822 Contact with and (suspected) exposure to covid-19: Secondary | ICD-10-CM | POA: Insufficient documentation

## 2020-02-18 DIAGNOSIS — Z9889 Other specified postprocedural states: Secondary | ICD-10-CM | POA: Diagnosis not present

## 2020-02-18 DIAGNOSIS — Z01812 Encounter for preprocedural laboratory examination: Secondary | ICD-10-CM | POA: Insufficient documentation

## 2020-02-18 DIAGNOSIS — Z01818 Encounter for other preprocedural examination: Secondary | ICD-10-CM | POA: Diagnosis not present

## 2020-02-18 LAB — BASIC METABOLIC PANEL
Anion gap: 13 (ref 5–15)
BUN: <5 mg/dL — ABNORMAL LOW (ref 8–23)
CO2: 22 mmol/L (ref 22–32)
Calcium: 9.5 mg/dL (ref 8.9–10.3)
Chloride: 102 mmol/L (ref 98–111)
Creatinine, Ser: 0.86 mg/dL (ref 0.44–1.00)
GFR, Estimated: >60 mL/min (ref >60)
Glucose, Bld: 122 mg/dL — ABNORMAL HIGH (ref 70–99)
Potassium: 3.6 mmol/L (ref 3.5–5.1)
Sodium: 137 mmol/L (ref 135–145)

## 2020-02-18 LAB — SARS CORONAVIRUS 2 (TAT 6-24 HRS): SARS Coronavirus 2: NEGATIVE

## 2020-02-18 MED ORDER — METHOCARBAMOL 500 MG PO TABS: 500.0000 mg | ORAL_TABLET | Freq: Three times a day (TID) | ORAL | 0 refills | Status: DC | PRN

## 2020-02-18 MED ORDER — OXYCODONE-ACETAMINOPHEN 7.5-325 MG PO TABS: ORAL_TABLET | ORAL | 0 refills | Status: DC

## 2020-02-18 NOTE — Progress Notes (Signed)

## 2020-02-18 NOTE — Progress Notes (Signed)
Office Visit Note   Patient: Olivia Ewing           Date of Birth: 11-Oct-1950           MRN: [id number] Visit Date: 02/18/2020              Requested by: [name], MD [address] [place],  [place] [address] PCP: [name], MD   Assessment & Plan: Visit Diagnoses:  1. S/P arthroscopy of right shoulder     Plan: Impression is right shoulder mass of the [facility] joint concerning for abscess versus gouty manifestation.  Today, I do not believe she is septic or needs to be urgently admitted.  We have given her an IM Toradol injection and I have refilled her narcotic pain medication.  She will let us know if her symptoms worsen or fail to improve before Monday.  We will otherwise see her Monday for surgery.  Call with concerns or questions in the meantime.  Follow-Up Instructions: Return for post-op.   Orders:  Orders Placed This Encounter  Procedures  . XR Shoulder Right   No orders of the defined types were placed in this encounter.     Procedures: No procedures performed   Clinical Data: No additional findings.   Subjective: Chief Complaint  Patient presents with  . Right Shoulder - Pain    HPI patient is a pleasant 70 year old female who comes in today with excruciating pain in her right shoulder.  She does have an underlying right shoulder AC gouty manifestation versus abscess that is scheduled for I&D this coming Monday.  She denies any fevers or chills or any other systemic symptoms.  She has had increased pain for the past 2 days.  She has been taking Norco without relief of symptoms.  Review of Systems as detailed in HPI.  All others reviewed and are negative.   Objective: Vital Signs: BP (!) 149/86   Pulse (!) 103   Temp 99.4 F (37.4 C)   SpO2 97%   Physical Exam well-developed well-nourished female in no acute distress.  Alert and oriented x3.  Oral temperature 99.4.  Ortho Exam right shoulder exam shows a right shoulder mass over the [facility]  joint which is moderately tender.  No drainage.  No erythema.  She is neurovascular intact distally.  Specialty Comments:  No specialty comments available.  Imaging: No new imaging   PMFS History: Patient Active Problem List   Diagnosis Date Noted  . Abscess of bursa of right shoulder 02/10/2020  . S/P arthroscopy of right shoulder 01/05/2020  . Carpal tunnel syndrome, left upper limb 10/25/2019    Class: Chronic  . Carpal tunnel syndrome, right upper limb 10/25/2019    Class: Chronic  . Nontraumatic incomplete tear of right rotator cuff 07/27/2019  . Tendinopathy of right biceps tendon 07/27/2019  . Breast cancer screening by mammogram 06/07/2019  . Seasonal allergic rhinitis due to pollen 06/07/2019  . Colon cancer screening 06/07/2019  . Spinal stenosis, lumbar region, with neurogenic claudication 03/16/2019    Class: Chronic  . Status post lumbar laminectomy 03/16/2019  . Acute stress reaction 09/25/2018  . Primary osteoarthritis of right hip 04/27/2018  . Status post total hip replacement, 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  . Shortness of breath 01/02/2018  . Chalazion left upper eyelid 02/20/2017  . Vitamin D insufficiency 11/25/2013  . Healthcare maintenance 11/25/2013  . Spinal stenosis of lumbar region 07/02/2013  . Arthritis, degenerative 11/17/2012  . Arthrosis of right acromioclavicular joint  06/05/2012  . Rectocele 04/28/2012  . Left knee DJD, degenerative meniscus tear 04/06/2012  . Numbness and tingling in hands 03/17/2012  . Periodontitis 09/23/2011  . Depression 09/23/2011  . Right knee meniscal tear 03/14/2011  . Lumbar radiculopathy 03/14/2011  . Stress incontinence of urine 10/18/2010  . NECK PAIN, CHRONIC 08/19/2008  . Normocytic anemia 05/12/2008  . SHOULDER PAIN, RIGHT 04/28/2008  . Hyperlipemia 06/18/2007  . Essential hypertension 05/20/2007   Past Medical History:  Diagnosis Date  . Anemia   . Arthritis    knees hands  . Bipolar disorder ([place])   .  Cervicalgia   . Depression   . Environmental allergies    cause SOB, uses inhaler for  . Environmental and seasonal allergies    uses inhaler prn  . Full dentures   . GERD (gastroesophageal reflux disease)    diet controlled - no meds  . Hyperlipidemia    diet controlled, no meds  . Hypertension   . Lumbar radiculopathy, chronic   . Right knee pain    posterior horn medial meniscal tear MRI 2013  . Spinal stenosis    getting epidural injections -last one 06/12/2015  . SVD (spontaneous vaginal delivery)    x 4    Family History  Problem Relation Age of Onset  . Hypertension Mother     Past Surgical History:  Procedure Laterality Date  . APPENDECTOMY    . Bladder tack  10/2006   cystocoele  . BREAST SURGERY Right    benign cyst  . CARPAL TUNNEL RELEASE Left 10/25/2019   Procedure: LEFT CARPAL TUNNEL RELEASE;  Surgeon: Jessy Oto, MD;  Location: [facility];  Service: Orthopedics;  Laterality: Left;  . COLONOSCOPY    . Hysterectomy other    . LUMBAR LAMINECTOMY N/A 03/16/2019   Procedure: CENTRAL LAMINECTOMIES L2-3, L3-4 AND L4-5;  Surgeon: Jessy Oto, MD;  Location: [facility];  Service: Orthopedics;  Laterality: N/A;  . TOTAL HIP ARTHROPLASTY Right 04/27/2018   Procedure: RIGHT TOTAL HIP ARTHROPLASTY ANTERIOR APPROACH;  Surgeon: Leandrew Koyanagi, MD;  Location: [facility];  Service: Orthopedics;  Laterality: Right;  . TUBAL LIGATION     Social History   Occupational History  . Not on file  Tobacco Use  . Smoking status: Former Smoker    Packs/day: 0.10    Quit date: 11/26/1983    Years since quitting: 36.2  . Smokeless tobacco: Never Used  Vaping Use  . Vaping Use: Never used  Substance and Sexual Activity  . Alcohol use: Yes    Alcohol/week: 0.0 standard drinks    Comment: rarely  . Drug use: Not Currently    Types: Marijuana    Comment: last used 2019  . Sexual activity: Not Currently    Birth control/protection: Surgical

## 2020-02-19 ENCOUNTER — Inpatient Hospital Stay: Admit: 2020-02-19 | Payer: Medicare HMO | Admitting: Orthopaedic Surgery

## 2020-02-19 ENCOUNTER — Encounter (HOSPITAL_COMMUNITY): Payer: Self-pay

## 2020-02-19 ENCOUNTER — Telehealth: Payer: Self-pay | Admitting: Surgical

## 2020-02-19 ENCOUNTER — Emergency Department (HOSPITAL_COMMUNITY)
Admission: EM | Admit: 2020-02-19 | Discharge: 2020-02-20 | Disposition: A | Discharge status: Home / Self Care | Payer: Medicare HMO | Attending: Emergency Medicine | Admitting: Emergency Medicine

## 2020-02-19 ENCOUNTER — Emergency Department (HOSPITAL_COMMUNITY): Payer: Medicare HMO

## 2020-02-19 ENCOUNTER — Other Ambulatory Visit: Payer: Self-pay

## 2020-02-19 DIAGNOSIS — M25519 Pain in unspecified shoulder: Secondary | ICD-10-CM | POA: Insufficient documentation

## 2020-02-19 DIAGNOSIS — Z5321 Procedure and treatment not carried out due to patient leaving prior to being seen by health care provider: Secondary | ICD-10-CM | POA: Diagnosis not present

## 2020-02-19 NOTE — ED Triage Notes (Signed)
Pt BIB [facility] from home for Shoulder pain. pts orthopedic sent her here for pain management. Pt has a hx of gout and appears to be inflamed. Daughter reports purulent drainage, not noted with EMS. Pt seen PCP a few days, received a Toradol injection yesterday as well as taking her prescribed pain medication with no relief.   100 mcg fentanyl  20g LW   BP 153/75 HR 94 95% RA  RR 20

## 2020-02-19 NOTE — H&P (Incomplete)
H&P  Chief Complaint: "My right shoulder hurts"  HPI: Olivia Ewing is a 70 y.o. female who presents for evaluation of right shoulder pain. She has been seeing Dr. Erlinda Hong for her Right shoulder pain and underwent right shoulder arthroscopic distal clavicle excision, biceps tenotomy, and subacromial decompression on 08/25/19. She had partial rotator cuff tear with no full-thickness component at the time according to operative note.  Did well after procedure but then noticed a " painful knot" that developed on superior shoulder in October 2021. Dr. Erlinda Hong ordered right shoulder MRI for further evaluation which revealed new focal full thickness defect at the anterior margin of the supraspinatus and worsened partial thickness articular surface tears of supraspinatus as well as new moderate amount of fluid extending from subacromial space to the [facility] joint and extending into a small fluid collection just superior to [facility] joint. Attempted aspiration of the cyst on 01/05/20 was without any aspirate and a cortisone injection was administered.    She followed up on 01/21/20 with continued pain and states injection only helped for one day. She also reported drainage from the cyst that appeared bloody and with gouty tophi. She had no systemic symptoms of infection at the time.  Patient declined the recommended joint aspiration, blood work due to fear of needles.  She requested just colchicine and was released with close follow-up with Dr. Erlinda Hong the following week.  Her symptoms were discussed with Dr. Erlinda Hong that day.  She canceled her appointment the following week and followed up with Dr. Erlinda Hong ~3 weeks later on 02/10/20 where she was posted for right shoulder irrigation and drainage for further evaluation and workup of her shoulder given the continued nature of her symptoms. She did not have any signs of sepsis at that time.    She notes significantly increased pain in the last several days since rolling over in bed.  No new injury.   Seen by Lanier Clam PA-C on 02/18/20 with no new radiographic findings, no signs of sepsis, and was prescribed Percocet 7.27m for pain control before her procedure on 02/21/20. She called the office on-call number on 02/19/20, noting severe pain in her right shoulder and bloody as well as purulent drainage from the right shoulder cyst.  Denies any systemic symptoms of infection such as fever, chills, night sweats.    Past Medical History:  Diagnosis Date  . Anemia   . Arthritis    knees hands  . Bipolar disorder ([place]   . Cervicalgia   . Depression   . Environmental allergies    cause SOB, uses inhaler for  . Environmental and seasonal allergies    uses inhaler prn  . Full dentures   . GERD (gastroesophageal reflux disease)    diet controlled - no meds  . Hyperlipidemia    diet controlled, no meds  . Hypertension   . Lumbar radiculopathy, chronic   . Right knee pain    posterior horn medial meniscal tear MRI 2013  . Spinal stenosis    getting epidural injections -last one 06/12/2015  . SVD (spontaneous vaginal delivery)    x 4   Past Surgical History:  Procedure Laterality Date  . APPENDECTOMY    . Bladder tack  10/2006   cystocoele  . BREAST SURGERY Right    benign cyst  . CARPAL TUNNEL RELEASE Left 10/25/2019   Procedure: LEFT CARPAL TUNNEL RELEASE;  Surgeon: NJessy Oto MD;  [place] [facility]  Service: Orthopedics;  Laterality: Left;  .  COLONOSCOPY    . Hysterectomy other    . LUMBAR LAMINECTOMY N/A 03/16/2019   Procedure: CENTRAL LAMINECTOMIES L2-3, L3-4 AND L4-5;  Surgeon: Jessy Oto, MD;  Location: [facility];  Service: Orthopedics;  Laterality: N/A;  . TOTAL HIP ARTHROPLASTY Right 04/27/2018   Procedure: RIGHT TOTAL HIP ARTHROPLASTY ANTERIOR APPROACH;  Surgeon: Leandrew Koyanagi, MD;  Location: [place];  Service: Orthopedics;  Laterality: Right;  . TUBAL LIGATION     Social History   Socioeconomic History  . Marital status: Divorced    Spouse name:  Not on file  . Number of children: 4  . Years of education: Not on file  . Highest education level: Not on file  Occupational History  . Not on file  Tobacco Use  . Smoking status: Former Smoker    Packs/day: 0.10    Quit date: 11/26/1983    Years since quitting: 36.2  . Smokeless tobacco: Never Used  Vaping Use  . Vaping Use: Never used  Substance and Sexual Activity  . Alcohol use: Yes    Alcohol/week: 0.0 standard drinks    Comment: rarely  . Drug use: Not Currently    Types: Marijuana    Comment: last used 2019  . Sexual activity: Not Currently    Birth control/protection: Surgical  Other Topics Concern  . Not on file  Social History Narrative   Single. 9 siblings. 1 brother with HIV, 1 sister with hypertension. 4 children, 1 daughter with HTN and depression, 1 son with HTN.   [phone]- shelter's number.    Former smoker, no alcohol or drug use.   Social Determinants of Health   Financial Resource Strain: Not on file  Food Insecurity: Not on file  Transportation Needs: Not on file  Physical Activity: Not on file  Stress: Not on file  Social Connections: Not on file   Family History  Problem Relation Age of Onset  . Hypertension Mother    Allergies  Allergen Reactions  . Tramadol Other (See Comments)    Hallucinate, nightmare    Prior to Admission medications   Medication Sig Start Date End Date Taking? Authorizing Provider  albuterol (VENTOLIN HFA) 108 (90 Base) MCG/ACT inhaler Inhale 1-2 puffs into the lungs every 6 (six) hours as needed for wheezing or shortness of breath. 01/29/19   Katherine Roan, MD  amLODipine (NORVASC) 5 MG tablet TAKE 1 TABLET BY MOUTH DAILY 01/22/20   Lacinda Axon, MD  diclofenac (VOLTAREN) 75 MG EC tablet TAKE 1 TABLET BY MOUTH TWICE A DAY 12/10/19   Jessy Oto, MD  ferrous gluconate (FERGON) 324 MG tablet Take 1 tablet (324 mg total) by mouth daily with breakfast. 01/13/20   Seawell, Jaimie A, DO  fluticasone  (FLONASE) 50 MCG/ACT nasal spray PLACE 1-2 SPRAYS INTO BOTH NOSTRILS DAILY. 01/31/20 01/30/21  [name], MD  gabapentin (NEURONTIN) 300 MG capsule Take 300 mg by mouth 3 (three) times daily. 09/21/19   [provider]  HYDROcodone-acetaminophen (NORCO) 5-325 MG tablet Take 1-2 tablets by mouth 2 (two) times daily as needed. 02/10/20   Aundra Dubin, PA-C  lisinopril-hydrochlorothiazide (ZESTORETIC) 20-12.5 MG tablet Take 1 tablet by mouth daily. 01/18/20   [name], MD  methocarbamol (ROBAXIN) 500 MG tablet Take 1 tablet (500 mg total) by mouth 3 (three) times daily as needed. 02/18/20   Aundra Dubin, PA-C  Multiple Vitamin (MULTIVITAMIN WITH MINERALS) TABS tablet Take 1 tablet by mouth daily.    [provider]  oxyCODONE-acetaminophen (PERCOCET) 7.5-325 MG tablet Take 1-2 every 6 hours prn pain 02/18/20   Aundra Dubin, PA-C  promethazine (PHENERGAN) 25 MG tablet  12/24/19   [provider]  QUEtiapine (SEROQUEL) 50 MG tablet Take 50 mg by mouth at bedtime.    [provider]  rosuvastatin (CRESTOR) 20 MG tablet Take 1 tablet (20 mg total) by mouth daily. 02/01/20 10/28/20  Maudie Mercury, MD  sertraline (ZOLOFT) 50 MG tablet TAKE 1 TABLET BY MOUTH DAILY 01/05/20   Virl Axe, MD     Positive ROS: ***  All other systems have been reviewed and were otherwise negative with the exception of those mentioned in the HPI and as above.  Physical Exam: Vitals:   02/19/20 1901  BP: (!) 174/100  Pulse: 96  Resp: 18  Temp: 99.4 F (37.4 C)  SpO2: 99%    General: Alert, no acute distress Cardiovascular: No pedal edema Respiratory: No cyanosis, no use of accessory musculature GI: No organomegaly, abdomen is soft and non-tender Skin: No lesions in the area of chief complaint Neurologic: Sensation intact distally Psychiatric: Patient is competent for consent with normal mood and affect Lymphatic: No axillary or cervical  lymphadenopathy  MUSCULOSKELETAL: ***  Assessment/Plan: Intractable right shoulder pain with history of drainage from right shoulder cyst -Pain control -Recommend inpatient admission -ESR, CRP, CBC diff, Uric acid -right shoulder I&D scheduled in [facility] on 02/21/20 will be rescheduled for [facility] main OR on 02/21/20 -Plan to consult [facility] radiologist for fluoroscopic vs US-guided aspiration of right shoulder for cell count, gram stain, anaerobic/aerobic cultures  History of severe foraminal stenosis of cervical spine -Order MRI C-spine for evaluation radicular source of right shoulder pain  Donella Stade, PA-C 02/19/2020 7:13 PM

## 2020-02-19 NOTE — Telephone Encounter (Signed)
Patient's daughter called.  Spoke with patient's daughter about her condition.  No improvement since yesterday's office visit and she has intractable right shoulder pain.  She has continued drainage.  Denies any fevers, chills, systemic symptoms suggestive of sepsis.  Pain is not alleviated by 7.5 mg Percocet.  With continued symptoms and drainage, recommended patient report to the ED.  Upon arriving to the ED plan for admission to the hospital for pain control with IV pain medication.  Recommend ESR, CRP, CBC differential, uric acid.  Plan for likely fluoroscopic aspiration of the right shoulder by radiology tomorrow to send the aspirate for Gram stain, cell count, anaerobic/aerobic culture.  Also recommend MRI of the cervical spine for further evaluation of potential worsened cervical pathology given patient's history of severe foraminal stenosis in January 2021.  Full H&P and evaluation to follow as I will report to the ED as well to evaluate patient once she has arrived.  Patient is currently being transported by ambulance to [facility] emergency department.  She understands that there is a significant lack of beds and wait time in the ED.

## 2020-02-20 ENCOUNTER — Telehealth: Payer: Self-pay | Admitting: Surgical

## 2020-02-20 NOTE — Telephone Encounter (Signed)
Olivia Ewing is a 70 y.o. female who has history of right shoulder pain. She has been seeing Dr. Erlinda Hong for her Right shoulder pain and underwent right shoulder arthroscopic distal clavicle excision, biceps tenotomy, and subacromial decompression on 08/25/19. She had partial rotator cuff tear with no full-thickness component at the time according to operative note.  Did well after procedure but then noticed a " painful knot" that developed on superior shoulder in October 2021. Dr. Erlinda Hong ordered right shoulder MRI for further evaluation which revealed new focal full thickness defect at the anterior margin of the supraspinatus and worsened partial thickness articular surface tears of supraspinatus as well as new moderate amount of fluid extending from subacromial space to the [facility] joint and extending into a small fluid collection just superior to [facility] joint. Attempted aspiration of the cyst on 01/05/20 was without any aspirate and a cortisone injection was administered.    She followed up on 01/21/20 with continued pain and states injection only helped for one day. She also reported drainage from the cyst that appeared bloody and with gouty tophi. She had no systemic symptoms of infection at the time.  Patient declined the recommended joint aspiration, blood work due to fear of needles.  She requested just colchicine and was released with close follow-up with Dr. Erlinda Hong the following week.  Her symptoms were discussed with Dr. Erlinda Hong that day.  She canceled her appointment the following week and followed up with Dr. Erlinda Hong ~3 weeks later instead on 02/10/20 where she was posted for right shoulder irrigation and drainage for further evaluation and workup of her shoulder given the continued nature of her symptoms. She did not have any signs of sepsis at that time.    She notes significantly increased pain in the last several days since rolling over in bed.  No new injury aside from rolling in bed.  Seen by Lanier Clam PA-C on  02/18/20 with no new radiographic findings, no signs of sepsis, and was prescribed Percocet 7.5mg  for pain control before her procedure on 02/21/20. She called the office on-call number on 02/19/20, noting severe, intractable pain in her right shoulder and bloody/purulent drainage from the right shoulder cyst. No drainage was noted by EMS or on the pictures sent to my cell phone at that time. Denies any systemic symptoms of infection such as fever, chills, night sweats.    Recommended she report to ED and she did so, understanding that no direct admission to hospital was possible and she would have to wait.  She insisted that "something has to be done today, I cannot wait until Monday".  Planned to admit from ED for further lab workup and MRI c-spine with aspiration of shoulder.  After a lengthy wait in the waiting room, she called the on-call provider multiple times where she was very upset about the wait-time in the ED.  She decided to leave the ED around 3 AM.  She has scheduled right shoulder I&D with Dr. Erlinda Hong on 02/21/20.

## 2020-02-20 NOTE — ED Notes (Signed)
Pt had tech call daughter and tell her she wanted to go home. Tech called and daughter said she would be on the way soon

## 2020-02-21 ENCOUNTER — Ambulatory Visit (HOSPITAL_BASED_OUTPATIENT_CLINIC_OR_DEPARTMENT_OTHER): Payer: Medicare HMO | Admitting: Certified Registered"

## 2020-02-21 ENCOUNTER — Encounter (HOSPITAL_BASED_OUTPATIENT_CLINIC_OR_DEPARTMENT_OTHER)
Admission: RE | Disposition: A | Discharge status: Home / Self Care | Payer: Self-pay | Source: Home / Self Care | Attending: Orthopaedic Surgery

## 2020-02-21 ENCOUNTER — Encounter (HOSPITAL_BASED_OUTPATIENT_CLINIC_OR_DEPARTMENT_OTHER): Payer: Self-pay | Admitting: Orthopaedic Surgery

## 2020-02-21 ENCOUNTER — Other Ambulatory Visit: Payer: Self-pay

## 2020-02-21 ENCOUNTER — Ambulatory Visit (HOSPITAL_BASED_OUTPATIENT_CLINIC_OR_DEPARTMENT_OTHER)
Admission: RE | Admit: 2020-02-21 | Discharge: 2020-02-21 | Disposition: A | Discharge status: Home / Self Care | Payer: Medicare HMO | Attending: Orthopaedic Surgery | Admitting: Orthopaedic Surgery

## 2020-02-21 DIAGNOSIS — Z791 Long term (current) use of non-steroidal anti-inflammatories (NSAID): Secondary | ICD-10-CM | POA: Insufficient documentation

## 2020-02-21 DIAGNOSIS — L02413 Cutaneous abscess of right upper limb: Secondary | ICD-10-CM | POA: Insufficient documentation

## 2020-02-21 DIAGNOSIS — Z96641 Presence of right artificial hip joint: Secondary | ICD-10-CM | POA: Diagnosis not present

## 2020-02-21 DIAGNOSIS — M009 Pyogenic arthritis, unspecified: Secondary | ICD-10-CM | POA: Diagnosis not present

## 2020-02-21 DIAGNOSIS — Z79899 Other long term (current) drug therapy: Secondary | ICD-10-CM | POA: Insufficient documentation

## 2020-02-21 DIAGNOSIS — Z87891 Personal history of nicotine dependence: Secondary | ICD-10-CM | POA: Diagnosis not present

## 2020-02-21 DIAGNOSIS — Z888 Allergy status to other drugs, medicaments and biological substances status: Secondary | ICD-10-CM | POA: Diagnosis not present

## 2020-02-21 HISTORY — PX: IRRIGATION AND DEBRIDEMENT SHOULDER: SHX5880

## 2020-02-21 SURGERY — IRRIGATION AND DEBRIDEMENT SHOULDER
Anesthesia: General | Site: Shoulder | Laterality: Right

## 2020-02-21 MED ORDER — AMISULPRIDE (ANTIEMETIC) 5 MG/2ML IV SOLN: 10.0000 mg | Freq: Once | INTRAVENOUS | Status: DC | PRN

## 2020-02-21 MED ORDER — LACTATED RINGERS IV SOLN: INTRAVENOUS | Status: DC | PRN

## 2020-02-21 MED ORDER — LIDOCAINE HCL (CARDIAC) PF 100 MG/5ML IV SOSY
PREFILLED_SYRINGE | INTRAVENOUS | Status: DC | PRN
  Administered 2020-02-21: 60 mg via INTRAVENOUS

## 2020-02-21 MED ORDER — PROPOFOL 10 MG/ML IV BOLUS
INTRAVENOUS | Status: DC | PRN
  Administered 2020-02-21: 200 mg via INTRAVENOUS

## 2020-02-21 MED ORDER — PIPERACILLIN-TAZOBACTAM 3.375 G IVPB 30 MIN
3.3750 g | Freq: Once | INTRAVENOUS | Status: AC
  Administered 2020-02-21: 3.375 g via INTRAVENOUS
  Filled 2020-02-21: qty 50

## 2020-02-21 MED ORDER — LACTATED RINGERS IV SOLN: INTRAVENOUS | Status: DC

## 2020-02-21 MED ORDER — PHENYLEPHRINE HCL (PRESSORS) 10 MG/ML IV SOLN
INTRAVENOUS | Status: DC | PRN
  Administered 2020-02-21 (×2): 200 ug via INTRAVENOUS
  Administered 2020-02-21: 80 ug via INTRAVENOUS

## 2020-02-21 MED ORDER — SULFAMETHOXAZOLE-TRIMETHOPRIM 800-160 MG PO TABS: 1.0000 | ORAL_TABLET | Freq: Two times a day (BID) | ORAL | 0 refills | Status: DC

## 2020-02-21 MED ORDER — LIDOCAINE 2% (20 MG/ML) 5 ML SYRINGE
INTRAMUSCULAR | Status: AC
  Filled 2020-02-21: qty 5

## 2020-02-21 MED ORDER — MIDAZOLAM HCL 2 MG/2ML IJ SOLN
INTRAMUSCULAR | Status: AC
  Filled 2020-02-21: qty 2

## 2020-02-21 MED ORDER — VANCOMYCIN HCL 500 MG IV SOLR
INTRAVENOUS | Status: DC | PRN
  Administered 2020-02-21: 500 mg via TOPICAL

## 2020-02-21 MED ORDER — FENTANYL CITRATE (PF) 100 MCG/2ML IJ SOLN
INTRAMUSCULAR | Status: AC
  Filled 2020-02-21: qty 2

## 2020-02-21 MED ORDER — OXYCODONE-ACETAMINOPHEN 7.5-325 MG PO TABS: 1.0000 | ORAL_TABLET | Freq: Four times a day (QID) | ORAL | 0 refills | Status: DC | PRN

## 2020-02-21 MED ORDER — PROMETHAZINE HCL 25 MG/ML IJ SOLN: 6.2500 mg | INTRAMUSCULAR | Status: DC | PRN

## 2020-02-21 MED ORDER — HYDROMORPHONE HCL 1 MG/ML IJ SOLN
INTRAMUSCULAR | Status: AC
  Filled 2020-02-21: qty 0.5

## 2020-02-21 MED ORDER — OXYCODONE HCL 5 MG PO TABS
5.0000 mg | ORAL_TABLET | Freq: Once | ORAL | Status: AC | PRN
  Administered 2020-02-21: 5 mg via ORAL

## 2020-02-21 MED ORDER — OXYCODONE HCL 5 MG/5ML PO SOLN: 5.0000 mg | Freq: Once | ORAL | Status: AC | PRN

## 2020-02-21 MED ORDER — PROPOFOL 10 MG/ML IV BOLUS
INTRAVENOUS | Status: AC
  Filled 2020-02-21: qty 20

## 2020-02-21 MED ORDER — HYDROMORPHONE HCL 1 MG/ML IJ SOLN
0.2500 mg | INTRAMUSCULAR | Status: DC | PRN
  Administered 2020-02-21: 0.5 mg via INTRAVENOUS

## 2020-02-21 MED ORDER — MEPERIDINE HCL 25 MG/ML IJ SOLN: 6.2500 mg | INTRAMUSCULAR | Status: DC | PRN

## 2020-02-21 MED ORDER — FENTANYL CITRATE (PF) 100 MCG/2ML IJ SOLN
INTRAMUSCULAR | Status: DC | PRN
  Administered 2020-02-21 (×6): 50 ug via INTRAVENOUS
  Administered 2020-02-21: 100 ug via INTRAVENOUS

## 2020-02-21 MED ORDER — EPHEDRINE SULFATE 50 MG/ML IJ SOLN
INTRAMUSCULAR | Status: DC | PRN
  Administered 2020-02-21 (×3): 15 mg via INTRAVENOUS

## 2020-02-21 MED ORDER — OXYCODONE HCL 5 MG PO TABS
ORAL_TABLET | ORAL | Status: AC
  Filled 2020-02-21: qty 1

## 2020-02-21 MED ORDER — VANCOMYCIN HCL 1000 MG IV SOLR
INTRAVENOUS | Status: DC | PRN
  Administered 2020-02-21: 1000 mg via INTRAVENOUS

## 2020-02-21 MED ORDER — ROCURONIUM BROMIDE 10 MG/ML (PF) SYRINGE
PREFILLED_SYRINGE | INTRAVENOUS | Status: AC
  Filled 2020-02-21: qty 10

## 2020-02-21 MED ORDER — DEXAMETHASONE SODIUM PHOSPHATE 10 MG/ML IJ SOLN
INTRAMUSCULAR | Status: AC
  Filled 2020-02-21: qty 1

## 2020-02-21 MED ORDER — BUPIVACAINE-EPINEPHRINE 0.25% -1:200000 IJ SOLN
INTRAMUSCULAR | Status: DC | PRN
  Administered 2020-02-21: 30 mL

## 2020-02-21 MED ORDER — ONDANSETRON HCL 4 MG/2ML IJ SOLN
INTRAMUSCULAR | Status: AC
  Filled 2020-02-21: qty 2

## 2020-02-21 MED ORDER — SODIUM CHLORIDE 0.9 % IR SOLN
Status: DC | PRN
  Administered 2020-02-21: 6000 mL

## 2020-02-21 SURGICAL SUPPLY — 52 items
APL SKNCLS STERI-STRIP NONHPOA (GAUZE/BANDAGES/DRESSINGS)
BENZOIN TINCTURE PRP APPL 2/3 (GAUZE/BANDAGES/DRESSINGS) IMPLANT
BLADE SURG 15 STRL LF DISP TIS (BLADE) ×2 IMPLANT
BLADE SURG 15 STRL SS (BLADE) ×4
BNDG GAUZE ELAST 4 BULKY (GAUZE/BANDAGES/DRESSINGS) ×1 IMPLANT
COVER WAND RF STERILE (DRAPES) IMPLANT
DRAPE IMP U-DRAPE 54X76 (DRAPES) ×2 IMPLANT
DRAPE INCISE IOBAN 66X45 STRL (DRAPES) ×2 IMPLANT
DRAPE U-SHAPE 47X51 STRL (DRAPES) ×4 IMPLANT
DRAPE U-SHAPE 76X120 STRL (DRAPES) ×4 IMPLANT
DRSG MEPILEX BORDER 4X8 (GAUZE/BANDAGES/DRESSINGS) IMPLANT
DRSG PAD ABDOMINAL 8X10 ST (GAUZE/BANDAGES/DRESSINGS) ×2 IMPLANT
DRSG TEGADERM 4X4.75 (GAUZE/BANDAGES/DRESSINGS) ×2 IMPLANT
DURAPREP 26ML APPLICATOR (WOUND CARE) ×2 IMPLANT
ELECT REM PT RETURN 9FT ADLT (ELECTROSURGICAL) ×2
ELECTRODE REM PT RTRN 9FT ADLT (ELECTROSURGICAL) ×1 IMPLANT
GAUZE SPONGE 4X4 12PLY STRL (GAUZE/BANDAGES/DRESSINGS) ×2 IMPLANT
GLOVE SURG LTX SZ7 (GLOVE) ×2 IMPLANT
GLOVE SURG NEOP MICRO LF SZ7.5 (GLOVE) ×2 IMPLANT
GLOVE SURG SYN 7.5  E (GLOVE) ×6
GLOVE SURG SYN 7.5 E (GLOVE) ×3 IMPLANT
GLOVE SURG SYN 7.5 PF PI (GLOVE) ×1 IMPLANT
GLOVE SURG UNDER POLY LF SZ7 (GLOVE) ×3 IMPLANT
GOWN STRL REIN XL XLG (GOWN DISPOSABLE) ×2 IMPLANT
GOWN STRL REUS W/ TWL LRG LVL3 (GOWN DISPOSABLE) ×1 IMPLANT
GOWN STRL REUS W/ TWL XL LVL3 (GOWN DISPOSABLE) ×1 IMPLANT
GOWN STRL REUS W/TWL LRG LVL3 (GOWN DISPOSABLE) ×2
GOWN STRL REUS W/TWL XL LVL3 (GOWN DISPOSABLE) ×4 IMPLANT
MANIFOLD NEPTUNE II (INSTRUMENTS) ×1 IMPLANT
PACK ARTHROSCOPY DSU (CUSTOM PROCEDURE TRAY) ×2 IMPLANT
PACK BASIN DAY SURGERY FS (CUSTOM PROCEDURE TRAY) ×2 IMPLANT
PENCIL SMOKE EVACUATOR (MISCELLANEOUS) ×2 IMPLANT
SHEET MEDIUM DRAPE 40X70 STRL (DRAPES) ×2 IMPLANT
SLEEVE SCD COMPRESS KNEE MED (MISCELLANEOUS) ×2 IMPLANT
SLING ARM FOAM STRAP LRG (SOFTGOODS) ×1 IMPLANT
SPONGE LAP 18X18 RF (DISPOSABLE) ×4 IMPLANT
STRIP CLOSURE SKIN 1/2X4 (GAUZE/BANDAGES/DRESSINGS) IMPLANT
SUCTION FRAZIER HANDLE 10FR (MISCELLANEOUS) ×2
SUCTION TUBE FRAZIER 10FR DISP (MISCELLANEOUS) ×1 IMPLANT
SUT ETHILON 2 0 FS 18 (SUTURE) ×1 IMPLANT
SUT MNCRL AB 4-0 PS2 18 (SUTURE) ×1 IMPLANT
SUT MON AB 2-0 CT1 36 (SUTURE) ×1 IMPLANT
SUT VIC AB 0 CT1 27 (SUTURE)
SUT VIC AB 0 CT1 27XBRD ANBCTR (SUTURE) ×1 IMPLANT
SUT VIC AB 2-0 CT1 27 (SUTURE)
SUT VIC AB 2-0 CT1 TAPERPNT 27 (SUTURE) ×1 IMPLANT
SWAB COLLECTION DEVICE MRSA (MISCELLANEOUS) ×1 IMPLANT
SWAB CULTURE ESWAB REG 1ML (MISCELLANEOUS) ×1 IMPLANT
SYR BULB EAR ULCER 3OZ GRN STR (SYRINGE) ×2 IMPLANT
TOWEL GREEN STERILE FF (TOWEL DISPOSABLE) ×2 IMPLANT
TRAY DSU PREP LF (CUSTOM PROCEDURE TRAY) ×1 IMPLANT
YANKAUER SUCT BULB TIP NO VENT (SUCTIONS) ×2 IMPLANT

## 2020-02-21 NOTE — Discharge Instructions (Signed)
 Postoperative instructions:  Weightbearing instructions: as tolerated  Dressing instructions: Keep your dressing and/or splint clean and dry at all times.  It will be removed at your first post-operative appointment.  Your stitches and/or staples will be removed at this visit.  Incision instructions:  Do not soak your incision for 3 weeks after surgery.  If the incision gets wet, pat dry and do not scrub the incision.  Pain control:  You have been given a prescription to be taken as directed for post-operative pain control.  In addition, elevate the operative extremity above the heart at all times to prevent swelling and throbbing pain.  Take over-the-counter Colace, 100mg by mouth twice a day while taking narcotic pain medications to help prevent constipation.  Follow up appointments: 1) 7 days for wound check. 2) Dr. Xu as scheduled.   -------------------------------------------------------------------------------------------------------------  After Surgery Pain Control:  After your surgery, post-surgical discomfort or pain is likely. This discomfort can last several days to a few weeks. At certain times of the day your discomfort may be more intense.  Did you receive a nerve block?  A nerve block can provide pain relief for one hour to two days after your surgery. As long as the nerve block is working, you will experience little or no sensation in the area the surgeon operated on.  As the nerve block wears off, you will begin to experience pain or discomfort. It is very important that you begin taking your prescribed pain medication before the nerve block fully wears off. Treating your pain at the first sign of the block wearing off will ensure your pain is better controlled and more tolerable when full-sensation returns. Do not wait until the pain is intolerable, as the medicine will be less effective. It is better to treat pain in advance than to try and catch up.  General  Anesthesia:  If you did not receive a nerve block during your surgery, you will need to start taking your pain medication shortly after your surgery and should continue to do so as prescribed by your surgeon.  Pain Medication:  Most commonly we prescribe Vicodin and Percocet for post-operative pain. Both of these medications contain a combination of acetaminophen (Tylenol) and a narcotic to help control pain.   It takes between 30 and 45 minutes before pain medication starts to work. It is important to take your medication before your pain level gets too intense.   Nausea is a common side effect of many pain medications. You will want to eat something before taking your pain medicine to help prevent nausea.   If you are taking a prescription pain medication that contains acetaminophen, we recommend that you do not take additional over the counter acetaminophen (Tylenol).  Other pain relieving options:   Using a cold pack to ice the affected area a few times a day (15 to 20 minutes at a time) can help to relieve pain, reduce swelling and bruising.   Elevation of the affected area can also help to reduce pain and swelling.   Post Anesthesia Home Care Instructions  Activity: Get plenty of rest for the remainder of the day. A responsible individual must stay with you for 24 hours following the procedure.  For the next 24 hours, DO NOT: -Drive a car -Operate machinery -Drink alcoholic beverages -Take any medication unless instructed by your physician -Make any legal decisions or sign important papers.  Meals: Start with liquid foods such as gelatin or soup. Progress to regular   foods as tolerated. Avoid greasy, spicy, heavy foods. If nausea and/or vomiting occur, drink only clear liquids until the nausea and/or vomiting subsides. Call your physician if vomiting continues.  Special Instructions/Symptoms: Your throat may feel dry or sore from the anesthesia or the breathing tube placed in  your throat during surgery. If this causes discomfort, gargle with warm salt water. The discomfort should disappear within 24 hours.  If you had a scopolamine patch placed behind your ear for the management of post- operative nausea and/or vomiting:  1. The medication in the patch is effective for 72 hours, after which it should be removed.  Wrap patch in a tissue and discard in the trash. Wash hands thoroughly with soap and water. 2. You may remove the patch earlier than 72 hours if you experience unpleasant side effects which may include dry mouth, dizziness or visual disturbances. 3. Avoid touching the patch. Wash your hands with soap and water after contact with the patch.      

## 2020-02-21 NOTE — Anesthesia Postprocedure Evaluation (Signed)
Anesthesia Post Note  Patient: Olivia Ewing  Procedure(s) Performed: IRRIGATION AND DEBRIDEMENT RIGHT SHOULDER (Right Shoulder)     Patient location during evaluation: PACU Anesthesia Type: General Level of consciousness: awake and alert Pain management: pain level controlled Vital Signs Assessment: post-procedure vital signs reviewed and stable Respiratory status: spontaneous breathing, nonlabored ventilation and respiratory function stable Cardiovascular status: blood pressure returned to baseline and stable Postop Assessment: no apparent nausea or vomiting Anesthetic complications: no   No complications documented.  Last Vitals:  Vitals:   02/21/20 1450 02/21/20 1515  BP:  114/85  Pulse:  (!) 104  Resp:  18  Temp:  36.6 C  SpO2: 100% 100%    Last Pain:  Vitals:   02/21/20 1515  TempSrc:   PainSc: 3                  Lynda Rainwater

## 2020-02-21 NOTE — Transfer of Care (Signed)
Immediate Anesthesia Transfer of Care Note  Patient: Olivia Ewing  Procedure(s) Performed: IRRIGATION AND DEBRIDEMENT RIGHT SHOULDER (Right Shoulder)  Patient Location: PACU  Anesthesia Type:General  Level of Consciousness: awake, alert  and oriented  Airway & Oxygen Therapy: Patient Spontanous Breathing and Patient connected to face mask oxygen  Post-op Assessment: Report given to RN and Post -op Vital signs reviewed and stable  Post vital signs: Reviewed and stable  Last Vitals:  Vitals Value Taken Time  BP    Temp    Pulse    Resp 26 02/21/20 1340  SpO2    Vitals shown include unvalidated device data.  Last Pain:  Vitals:   02/21/20 1008  TempSrc: Oral  PainSc: 10-Worst pain ever      Patients Stated Pain Goal: 6 (37/48/27 0786)  Complications: No complications documented.

## 2020-02-21 NOTE — Progress Notes (Signed)
Pt arrived in preop for her surgery, naked with a blanket, and refused to walk to the preop room.  Nurse tech assisted pt with a wheelchair to the preop room.  Pt crying and moaning during the preop admission, pt stated she was in terrible right shoulder pain.  Pt rates pain a ten, and states she tried to get pain medication from the ED, but was unsuccessful.  Pt seen in ED 02/19/2020 and stated she wore 2 masks while she was in the ED.  Dr. Sabra Heck notified of ED visit after covid testing done, 02/18/2020. RN encouraged relaxation tecniques and breathing to try and relieve pt's pain.   Dr. Sabra Heck started pt's IV with ultrasound, and pt tolerate well.  Pt cooperative with RN and staff, but pt refused prep/clip on right shoulder.  Pt continued to cry and moan during her stay in the preop.  OR and PACU notified of pt's behavior and pt's pain.

## 2020-02-21 NOTE — Anesthesia Preprocedure Evaluation (Signed)
Anesthesia Evaluation  Patient identified by MRN, date of birth, ID band Patient awake    Reviewed: Allergy & Precautions, NPO status , Patient's Chart, lab work & pertinent test results  Airway Mallampati: II  TM Distance: >3 FB Neck ROM: Full    Dental  (+) Edentulous Upper, Edentulous Lower   Pulmonary former smoker,    breath sounds clear to auscultation       Cardiovascular hypertension, Pt. on medications  Rhythm:Regular Rate:Normal     Neuro/Psych Anxiety Depression Bipolar Disorder    GI/Hepatic GERD  ,  Endo/Other    Renal/GU      Musculoskeletal  (+) Arthritis , Osteoarthritis,    Abdominal (+) + obese,   Peds  Hematology  (+) anemia ,   Anesthesia Other Findings   Reproductive/Obstetrics                             Anesthesia Physical  Anesthesia Plan  ASA: III  Anesthesia Plan: General   Post-op Pain Management:    Induction: Intravenous  PONV Risk Score and Plan: 3 and Ondansetron, Dexamethasone, Midazolam and Treatment may vary due to age or medical condition  Airway Management Planned: LMA  Additional Equipment:   Intra-op Plan:   Post-operative Plan: Extubation in OR  Informed Consent: I have reviewed the patients History and Physical, chart, labs and discussed the procedure including the risks, benefits and alternatives for the proposed anesthesia with the patient or authorized representative who has indicated his/her understanding and acceptance.       Plan Discussed with: CRNA and Anesthesiologist  Anesthesia Plan Comments:         Anesthesia Quick Evaluation  

## 2020-02-21 NOTE — Anesthesia Procedure Notes (Signed)
Procedure Name: LMA Insertion Performed by: Ryen Rhames M, CRNA Pre-anesthesia Checklist: Patient identified, Emergency Drugs available, Suction available and Patient being monitored Patient Re-evaluated:Patient Re-evaluated prior to induction Oxygen Delivery Method: Circle system utilized Preoxygenation: Pre-oxygenation with 100% oxygen Induction Type: IV induction Ventilation: Mask ventilation without difficulty LMA: LMA inserted LMA Size: 4.0 Number of attempts: 1 Airway Equipment and Method: Bite block Placement Confirmation: positive ETCO2 Tube secured with: Tape Dental Injury: Teeth and Oropharynx as per pre-operative assessment        

## 2020-02-21 NOTE — H&P (Signed)
PREOPERATIVE H&P  Chief Complaint: right shoulder abscess  HPI: Olivia Ewing is a 70 y.o. female who presents for surgical treatment of right shoulder abscess.  She denies any changes in medical history.  Past Medical History:  Diagnosis Date  . Anemia   . Arthritis    knees hands  . Bipolar disorder ([place])   . Cervicalgia   . Depression   . Environmental allergies    cause SOB, uses inhaler for  . Environmental and seasonal allergies    uses inhaler prn  . Full dentures   . GERD (gastroesophageal reflux disease)    diet controlled - no meds  . Hyperlipidemia    diet controlled, no meds  . Hypertension   . Lumbar radiculopathy, chronic   . Right knee pain    posterior horn medial meniscal tear MRI 2013  . Spinal stenosis    getting epidural injections -last one 06/12/2015  . SVD (spontaneous vaginal delivery)    x 4   Past Surgical History:  Procedure Laterality Date  . APPENDECTOMY    . Bladder tack  10/2006   cystocoele  . BREAST SURGERY Right    benign cyst  . CARPAL TUNNEL RELEASE Left 10/25/2019   Procedure: LEFT CARPAL TUNNEL RELEASE;  Surgeon: Jessy Oto, MD;  Location: [place];  Service: Orthopedics;  Laterality: Left;  . COLONOSCOPY    . Hysterectomy other    . LUMBAR LAMINECTOMY N/A 03/16/2019   Procedure: CENTRAL LAMINECTOMIES L2-3, L3-4 AND L4-5;  Surgeon: Jessy Oto, MD;  Location: [place];  Service: Orthopedics;  Laterality: N/A;  . TOTAL HIP ARTHROPLASTY Right 04/27/2018   Procedure: RIGHT TOTAL HIP ARTHROPLASTY ANTERIOR APPROACH;  Surgeon: Leandrew Koyanagi, MD;  Location: [place];  Service: Orthopedics;  Laterality: Right;  . TUBAL LIGATION     Social History   Socioeconomic History  . Marital status: Divorced    Spouse name: Not on file  . Number of children: 4  . Years of education: Not on file  . Highest education level: Not on file  Occupational History  . Not on file  Tobacco Use  . Smoking status: Former Smoker     Packs/day: 0.10    Quit date: 11/26/1983    Years since quitting: 36.2  . Smokeless tobacco: Never Used  Vaping Use  . Vaping Use: Never used  Substance and Sexual Activity  . Alcohol use: Yes    Alcohol/week: 0.0 standard drinks    Comment: rarely  . Drug use: Not Currently    Types: Marijuana    Comment: last used 2019  . Sexual activity: Not Currently    Birth control/protection: Surgical  Other Topics Concern  . Not on file  Social History Narrative   Single. 9 siblings. 1 brother with HIV, 1 sister with hypertension. 4 children, 1 daughter with HTN and depression, 1 son with HTN.   [phone]- shelter's number.    Former smoker, no alcohol or drug use.   Social Determinants of Health   Financial Resource Strain: Not on file  Food Insecurity: Not on file  Transportation Needs: Not on file  Physical Activity: Not on file  Stress: Not on file  Social Connections: Not on file   Family History  Problem Relation Age of Onset  . Hypertension Mother    Allergies  Allergen Reactions  . Tramadol Other (See Comments)    Hallucinate, nightmare    Prior to Admission medications  Medication Sig Start Date End Date Taking? Authorizing Provider  albuterol (VENTOLIN HFA) 108 (90 Base) MCG/ACT inhaler Inhale 1-2 puffs into the lungs every 6 (six) hours as needed for wheezing or shortness of breath. 01/29/19  Yes Katherine Roan, MD  amLODipine (NORVASC) 5 MG tablet TAKE 1 TABLET BY MOUTH DAILY 01/22/20  Yes Lacinda Axon, MD  diclofenac (VOLTAREN) 75 MG EC tablet TAKE 1 TABLET BY MOUTH TWICE A DAY 12/10/19  Yes Jessy Oto, MD  ferrous gluconate (FERGON) 324 MG tablet Take 1 tablet (324 mg total) by mouth daily with breakfast. 01/13/20  Yes Seawell, Jaimie A, DO  fluticasone (FLONASE) 50 MCG/ACT nasal spray PLACE 1-2 SPRAYS INTO BOTH NOSTRILS DAILY. 01/31/20 01/30/21 Yes [name], MD  gabapentin (NEURONTIN) 300 MG capsule Take 300 mg by mouth 3 (three) times  daily. 09/21/19  Yes [provider]  HYDROcodone-acetaminophen (NORCO) 5-325 MG tablet Take 1-2 tablets by mouth 2 (two) times daily as needed. 02/10/20  Yes Aundra Dubin, PA-C  lisinopril-hydrochlorothiazide (ZESTORETIC) 20-12.5 MG tablet Take 1 tablet by mouth daily. 01/18/20  Yes [name], MD  Multiple Vitamin (MULTIVITAMIN WITH MINERALS) TABS tablet Take 1 tablet by mouth daily.   Yes [provider]  QUEtiapine (SEROQUEL) 50 MG tablet Take 50 mg by mouth at bedtime.   Yes [provider]  rosuvastatin (CRESTOR) 20 MG tablet Take 1 tablet (20 mg total) by mouth daily. 02/01/20 10/28/20 Yes Maudie Mercury, MD  sertraline (ZOLOFT) 50 MG tablet TAKE 1 TABLET BY MOUTH DAILY 01/05/20  Yes Virl Axe, MD  methocarbamol (ROBAXIN) 500 MG tablet Take 1 tablet (500 mg total) by mouth 3 (three) times daily as needed. 02/18/20   Aundra Dubin, PA-C  oxyCODONE-acetaminophen (PERCOCET) 7.5-325 MG tablet Take 1-2 every 6 hours prn pain 02/18/20   Aundra Dubin, PA-C  promethazine (PHENERGAN) 25 MG tablet  12/24/19   [provider]     Positive ROS: All other systems have been reviewed and were otherwise negative with the exception of those mentioned in the HPI and as above.  Physical Exam: General: Alert, no acute distress Cardiovascular: No pedal edema Respiratory: No cyanosis, no use of accessory musculature GI: abdomen soft Skin: No lesions in the area of chief complaint Neurologic: Sensation intact distally Psychiatric: Patient is competent for consent with normal mood and affect Lymphatic: no lymphedema  MUSCULOSKELETAL: exam stable  Assessment: right shoulder abscess  Plan: Plan for Procedure(s): IRRIGATION AND DEBRIDEMENT RIGHT SHOULDER  The risks benefits and alternatives were discussed with the patient including but not limited to the risks of nonoperative treatment, versus surgical intervention including infection, bleeding, nerve  injury,  blood clots, cardiopulmonary complications, morbidity, mortality, among others, and they were willing to proceed.   Preoperative templating of the joint replacement has been completed, documented, and submitted to the Operating Room personnel in order to optimize intra-operative equipment management.   Eduard Roux, MD 02/21/2020 9:59 AM

## 2020-02-21 NOTE — Op Note (Signed)
   Date of Surgery: 02/21/2020  INDICATIONS: Ms. Hrivnak is a 70 y.o.-year-old female with questionable right shoulder abscess that had a negative work-up for infection.  She presents today for formal exploration.  The patient did consent to the procedure after discussion of the risks and benefits.  PREOPERATIVE DIAGNOSIS: Questionable right shoulder and AC joint infection  POSTOPERATIVE DIAGNOSIS:  1.  Septic arthritis right AC joint 2.  Deep abscess of right shoulder region including subacromial space  PROCEDURE:  1.  Irrigation and debridement of skin, subcutaneous tissue, bone for septic arthritis right AC joint 2.  Irrigation and debridement of right shoulder deep abscess  SURGEON: N. Eduard Roux, M.D.  ASSIST: Ciro Backer [place], [place]; necessary for the timely completion of procedure and due to complexity of procedure.  ANESTHESIA:  general  IV FLUIDS AND URINE: See anesthesia.  ESTIMATED BLOOD LOSS: 50 mL.  IMPLANTS: None  DRAINS: None  COMPLICATIONS: see description of procedure.  DESCRIPTION OF PROCEDURE: The patient was brought to the operating room.  The patient had been signed prior to the procedure and this was documented. The patient had the anesthesia placed by the anesthesiologist.  A time-out was performed to confirm that this was the correct patient, site, side and location. The patient did receive antibiotics prior to the incision and was re-dosed during the procedure as needed at indicated intervals.  The patient had the operative extremity prepped and draped in the standard surgical fashion.    An incision was made directly over the fluctuant and indurated area overlying the shoulder.  The sinus tract was excised sharply along with the skin and underlying subcutaneous tissue.  There was return of frank purulence.  Fluid and tissue cultures were taken.  We continued our dissection down into the [facility] joint which had gross purulence.  Further dissection down into the  subacromial space resulted in frank purulence as well.  All of this pus was evacuated.  Sharp excisional debridement was performed using a rondure throughout the surgical wound that included the subcutaneous tissue and the periosteum and the distal clavicle bone and the soft tissues in the subacromial space.  After thorough debridement 6 L of normal saline was irrigated with cystoscopy tubing.  Hemostasis was then obtained.  Half a gram of vancomycin powder was placed in the surgical wound.  Surgical wound was then closed with interrupted 2-0 Monocryl and 2-0 nylon.  Sterile dressings were applied.  Patient tolerated procedure well had no any complications.  Tawanna Cooler was critical for opening, closing, limb positioning, retracting and overall facilitation and timely completion of the surgery.  POSTOPERATIVE PLAN: We will keep her on empiric antibiotics and follow up with the cultures.  We will have her come back to the office in 1 week for recheck.  Azucena Cecil, MD 1:29 PM

## 2020-02-22 ENCOUNTER — Encounter (HOSPITAL_BASED_OUTPATIENT_CLINIC_OR_DEPARTMENT_OTHER): Payer: Self-pay | Admitting: Orthopaedic Surgery

## 2020-02-22 DIAGNOSIS — M009 Pyogenic arthritis, unspecified: Secondary | ICD-10-CM

## 2020-02-22 LAB — ACID FAST SMEAR (AFB, MYCOBACTERIA): Acid Fast Smear: NEGATIVE

## 2020-02-23 ENCOUNTER — Telehealth: Payer: Self-pay | Admitting: Orthopaedic Surgery

## 2020-02-23 ENCOUNTER — Telehealth: Payer: Self-pay

## 2020-02-23 ENCOUNTER — Other Ambulatory Visit: Payer: Self-pay

## 2020-02-23 ENCOUNTER — Other Ambulatory Visit: Payer: Self-pay | Admitting: Orthopaedic Surgery

## 2020-02-23 DIAGNOSIS — Z9889 Other specified postprocedural states: Secondary | ICD-10-CM

## 2020-02-23 MED ORDER — CEPHALEXIN 500 MG PO CAPS: 500.0000 mg | ORAL_CAPSULE | Freq: Four times a day (QID) | ORAL | 0 refills | Status: DC

## 2020-02-23 NOTE — Progress Notes (Signed)
Olivia Ewing, Can you fax over culture results and referral ASAP for appointment to see Dr. Megan Salon of ID for treatment of right shoulder infection.  Also, can you let her know that we're switching her antibiotic to keflex for now and to stop taking the bactrim I originally sent in for her.  Thank you.

## 2020-02-23 NOTE — Telephone Encounter (Signed)
Pt daughter call and would like to know how they can go about getting this pt a nurse for after she has the surgery. Please give her daughter a call back at [phone]

## 2020-02-23 NOTE — Telephone Encounter (Signed)
Can you please call daughter.  She would like to speak to you.

## 2020-02-23 NOTE — Telephone Encounter (Signed)
Will fax order to [facility] now.

## 2020-02-23 NOTE — Telephone Encounter (Signed)
Received call in triage from Dr. Megan Salon requesting ASAP appointment for patient referred by Dr. Erlinda Hong. Dr. Erlinda Hong office to fax notes over to [facility]. Patient being seen for right shoulder infection. Spoke with patient's daughter and accepts phone appointment tomorrow with Janene Madeira, NP. Olivia Ewing

## 2020-02-23 NOTE — Telephone Encounter (Signed)
Spoke to her daughter and addressed their concerns and the main issue is that she is having trouble with ADLs and mobilizing so I think we need to get home health PT and a home health nurse out to her house ASAP please.  Based on my conversation with her daughter she is in no acute distress and does not need to go to the ER.

## 2020-02-23 NOTE — Telephone Encounter (Signed)
Ok then let's set up some HHPT

## 2020-02-23 NOTE — Telephone Encounter (Signed)
Will she need HHPT?

## 2020-02-23 NOTE — Telephone Encounter (Signed)
Daughter called and states patient had Surgery 02/21/2020. Cannot move arm. +Burning/Pain. Taking ABX and Pain meds. States "she is not happy and is stuck with this office" Would like a CB ASAP.    CB [phone]

## 2020-02-23 NOTE — Telephone Encounter (Signed)
No she doesn't need HHPT

## 2020-02-24 ENCOUNTER — Telehealth: Payer: Self-pay

## 2020-02-24 ENCOUNTER — Other Ambulatory Visit: Payer: Self-pay

## 2020-02-24 ENCOUNTER — Telehealth (INDEPENDENT_AMBULATORY_CARE_PROVIDER_SITE_OTHER): Payer: Medicare HMO | Admitting: Infectious Diseases

## 2020-02-24 ENCOUNTER — Ambulatory Visit: Payer: Medicare HMO | Admitting: Infectious Diseases

## 2020-02-24 ENCOUNTER — Encounter: Payer: Self-pay | Admitting: Internal Medicine

## 2020-02-24 DIAGNOSIS — Z5181 Encounter for therapeutic drug level monitoring: Secondary | ICD-10-CM | POA: Diagnosis not present

## 2020-02-24 DIAGNOSIS — M00019 Staphylococcal arthritis, unspecified shoulder: Secondary | ICD-10-CM

## 2020-02-24 DIAGNOSIS — B9561 Methicillin susceptible Staphylococcus aureus infection as the cause of diseases classified elsewhere: Secondary | ICD-10-CM | POA: Diagnosis not present

## 2020-02-24 DIAGNOSIS — A4901 Methicillin susceptible Staphylococcus aureus infection, unspecified site: Secondary | ICD-10-CM | POA: Insufficient documentation

## 2020-02-24 DIAGNOSIS — M00011 Staphylococcal arthritis, right shoulder: Secondary | ICD-10-CM | POA: Diagnosis not present

## 2020-02-24 MED ORDER — CEPHALEXIN 500 MG PO CAPS: 500.0000 mg | ORAL_CAPSULE | Freq: Four times a day (QID) | ORAL | 0 refills | Status: DC

## 2020-02-24 NOTE — Progress Notes (Addendum)
Virtual Visit via Telephone Note  I connected with@ on 02/24/20 at  3:30 PM EST by a telephone enabled telemedicine application and verified that I am speaking with the correct person using two identifiers.  Location: Patient: Olivia Ewing Provider: [facility]   I discussed the limitations of evaluation and management by telemedicine and the availability of in person appointments. The patient expressed understanding and agreed to proceed.  [place] [facility] Infectious Disease  Patient Active Problem List   Diagnosis Date Noted  . MSSA (methicillin susceptible Staphylococcus aureus) infection 02/24/2020  . Septic arthritis of right acromioclavicular joint ([place])   . Abscess of bursa of right shoulder 02/10/2020  . S/P arthroscopy of right shoulder 01/05/2020  . Carpal tunnel syndrome, left upper limb 10/25/2019    Class: Chronic  . Carpal tunnel syndrome, right upper limb 10/25/2019    Class: Chronic  . Nontraumatic incomplete tear of right rotator cuff 07/27/2019  . Tendinopathy of right biceps tendon 07/27/2019  . Breast cancer screening by mammogram 06/07/2019  . Seasonal allergic rhinitis due to pollen 06/07/2019  . Colon cancer screening 06/07/2019  . Spinal stenosis, lumbar region, with neurogenic claudication 03/16/2019    Class: Chronic  . Status post lumbar laminectomy 03/16/2019  . Acute stress reaction 09/25/2018  . Primary osteoarthritis of right hip 04/27/2018  . Status post total hip replacement, 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  . Shortness of breath 01/02/2018  . Chalazion left upper eyelid 02/20/2017  . Vitamin D insufficiency 11/25/2013  . Healthcare maintenance 11/25/2013  . Spinal stenosis of lumbar region 07/02/2013  . Arthritis, degenerative 11/17/2012  . Arthrosis of right acromioclavicular joint 06/05/2012  . Rectocele 04/28/2012  . Left knee DJD, degenerative meniscus tear 04/06/2012  . Numbness and tingling in hands 03/17/2012  . Periodontitis 09/23/2011  .  Depression 09/23/2011  . Right knee meniscal tear 03/14/2011  . Lumbar radiculopathy 03/14/2011  . Stress incontinence of urine 10/18/2010  . NECK PAIN, CHRONIC 08/19/2008  . Normocytic anemia 05/12/2008  . SHOULDER PAIN, RIGHT 04/28/2008  . Hyperlipemia 06/18/2007  . Essential hypertension 05/20/2007    Patient's Medications  New Prescriptions   CEPHALEXIN (KEFLEX) 500 MG CAPSULE    Take 1 capsule (500 mg total) by mouth 4 (four) times daily.  Previous Medications   ALBUTEROL (VENTOLIN HFA) 108 (90 BASE) MCG/ACT INHALER    Inhale 1-2 puffs into the lungs every 6 (six) hours as needed for wheezing or shortness of breath.   AMLODIPINE (NORVASC) 5 MG TABLET    TAKE 1 TABLET BY MOUTH DAILY   CEPHALEXIN (KEFLEX) 500 MG CAPSULE    Take 1 capsule (500 mg total) by mouth 4 (four) times daily.   DICLOFENAC (VOLTAREN) 75 MG EC TABLET    TAKE 1 TABLET BY MOUTH TWICE A DAY   FERROUS GLUCONATE (FERGON) 324 MG TABLET    Take 1 tablet (324 mg total) by mouth daily with breakfast.   FLUTICASONE (FLONASE) 50 MCG/ACT NASAL SPRAY    PLACE 1-2 SPRAYS INTO BOTH NOSTRILS DAILY.   GABAPENTIN (NEURONTIN) 300 MG CAPSULE    Take 300 mg by mouth 3 (three) times daily.   LISINOPRIL-HYDROCHLOROTHIAZIDE (ZESTORETIC) 20-12.5 MG TABLET    Take 1 tablet by mouth daily.   METHOCARBAMOL (ROBAXIN) 500 MG TABLET    Take 1 tablet (500 mg total) by mouth 3 (three) times daily as needed.   MULTIPLE VITAMIN (MULTIVITAMIN WITH MINERALS) TABS TABLET    Take 1 tablet by mouth daily.   OXYCODONE-ACETAMINOPHEN (PERCOCET) 7.5-325 MG TABLET  Take 1-2 tablets by mouth every 6 (six) hours as needed for severe pain. Take 1-2 every 6 hours prn pain   PROMETHAZINE (PHENERGAN) 25 MG TABLET       QUETIAPINE (SEROQUEL) 50 MG TABLET    Take 50 mg by mouth at bedtime.   ROSUVASTATIN (CRESTOR) 20 MG TABLET    Take 1 tablet (20 mg total) by mouth daily.   SERTRALINE (ZOLOFT) 50 MG TABLET    TAKE 1 TABLET BY MOUTH DAILY  Modified Medications    No medications on file  Discontinued Medications   SULFAMETHOXAZOLE-TRIMETHOPRIM (BACTRIM DS) 800-160 MG TABLET    Take 1 tablet by mouth 2 (two) times daily.    History of Present Illness: 70 Year old Female with a PMH of Total Rt hip replacement, rt biceps tendinosis, partial rotator cuff tear for which she  underwent RT shoulder arthroscopy with distal clavicle excision, extensive debridement of supraspinatus, infraspinatus,  Labrum, biceps, synovitis and subacromial bursitis and subacromial decompression with acromioplasty and ca ligament release on 08/25/19  She was seen in 12/21/19 with Ortho for evaluation of painful knot in the rt shoulder approx 3 months post Rt shoulder arthroscopy which developed approx 3 weeks ago.  MRI rt shoulder 11/11 IMPRESSION: 1. Interval postsurgical changes of the [facility] joint with new moderate amount of fluid in the subacromial/subdeltoid bursa communicating with the [facility] joint space and extending into a small 8 mm fluid collection just superior to the joint, accounting for the palpable abnormality. 2. Continued severe supraspinatus tendinosis with new focal full-thickness defect at the anterior margin of the proximal tendon. Worsened high-grade partial-thickness articular surface tear anteriorly at the insertion and largely unchanged high-grade partial-thickness articular surface tear of the mid to distal tendon. 3. Unchanged severe subscapularis tendinosis with low-grade partial-thickness articular surface tear at the insertion superiorly. 4. New small tear of the posterior labrum.  She had US guided injection of steroids. An attempt was made to aspirate the [facility] joint nodule which was hypoechoic without a significant fluid collection to be aspirated. Patient was seen by Dr Erlinda Hong on 12/30 where there was concerns of rt shoulder mass cs abscess vs gouty manifestation and underwent I and D of rt AC joint and rt shoulder Ewing abscess on 1/10.   OR note  reviewed in detail which mentions frank purulence. Fluid and tissue cx sent which is growing MSSA in day 3. Half a gram of vancomycin powder was placed in the surgical wound.   I spoke with the patient but mostly with her adopted aunt given patient was in severe pain. Denies any fevers, chills, sweats, nausea, vomiting, abdominal pain and diarrhea. She has restricted ROM in the rt shoulder due to pain. She has been taking Bactrim post sx on 02/21/19  ROS: 11 point ROS done with pertinent positives and negatives listed above. Rest of ROS is negative   Past Medical History:  Diagnosis Date  . Anemia   . Arthritis    knees hands  . Bipolar disorder ([place])   . Cervicalgia   . Depression   . Environmental allergies    cause SOB, uses inhaler for  . Environmental and seasonal allergies    uses inhaler prn  . Full dentures   . GERD (gastroesophageal reflux disease)    diet controlled - no meds  . Hyperlipidemia    diet controlled, no meds  . Hypertension   . Lumbar radiculopathy, chronic   . Right knee pain    posterior horn medial  meniscal tear MRI 2013  . Spinal stenosis    getting epidural injections -last one 06/12/2015  . SVD (spontaneous vaginal delivery)    x 4   Past Surgical History:  Procedure Laterality Date  . APPENDECTOMY    . Bladder tack  10/2006   cystocoele  . BREAST SURGERY Right    benign cyst  . CARPAL TUNNEL RELEASE Left 10/25/2019   Procedure: LEFT CARPAL TUNNEL RELEASE;  Surgeon: Jessy Oto, MD;  Location: [facility];  Service: Orthopedics;  Laterality: Left;  . COLONOSCOPY    . Hysterectomy other    . IRRIGATION AND DEBRIDEMENT SHOULDER Right 02/21/2020   Procedure: IRRIGATION AND DEBRIDEMENT RIGHT SHOULDER;  Surgeon: Leandrew Koyanagi, MD;  Location: [place] [facility];  Service: Orthopedics;  Laterality: Right;  . LUMBAR LAMINECTOMY N/A 03/16/2019   Procedure: CENTRAL LAMINECTOMIES L2-3, L3-4 AND L4-5;  Surgeon: Jessy Oto, MD;   Location: [facility];  Service: Orthopedics;  Laterality: N/A;  . TOTAL HIP ARTHROPLASTY Right 04/27/2018   Procedure: RIGHT TOTAL HIP ARTHROPLASTY ANTERIOR APPROACH;  Surgeon: Leandrew Koyanagi, MD;  Location: [place];  Service: Orthopedics;  Laterality: Right;  . TUBAL LIGATION       Social History   Tobacco Use  . Smoking status: Former Smoker    Packs/day: 0.10    Quit date: 11/26/1983    Years since quitting: 36.2  . Smokeless tobacco: Never Used  Vaping Use  . Vaping Use: Never used  Substance Use Topics  . Alcohol use: Yes    Alcohol/week: 0.0 standard drinks    Comment: rarely  . Drug use: Not Currently    Types: Marijuana    Comment: last used 2019    Family History  Problem Relation Age of Onset  . Hypertension Mother     Allergies  Allergen Reactions  . Tramadol Other (See Comments)    Hallucinate, nightmare     Health Maintenance  Topic Date Due  . COVID-19 Vaccine (1) Never done  . MAMMOGRAM  01/10/2013  . TETANUS/TDAP  08/20/2018  . PNA vac Low Risk Adult (2 of 2 - PPSV23) 01/16/2019  . COLONOSCOPY (Pts 45-77yr Insurance coverage will need to be confirmed)  03/02/2019  . INFLUENZA VACCINE  09/12/2019  . DEXA SCAN  Completed  . Hepatitis C Screening  Completed    Observations/Objective: Crying in pain  Assessment and Plan: RT shoulder septic arthritis in the setting of recent arthroscopy and operative intervention in 08/25/19 ( OR cx growing MSSA in day 3) -PICC line -IV cefazolin 2 g iv q8 hrs -Plan to treat for at least 4 weeks or more depending on clinical progress -Baseline labs: ESR, CRP and CPK -Fu in 2 weeks an In person appointment -Advised to take cephalexin [id number] q6 hrs until IV abx can be set up  Follow Up Instructions: 1. Follow up in 2 weeks 2. Follow up with Orthopedics per their recs    I discussed the assessment and treatment plan with the patient. The patient was provided an opportunity to ask questions and all were answered. The  patient agreed with the plan and demonstrated an understanding of the instructions.   The patient was advised to call back or seek an in-person evaluation if the symptoms worsen or if the condition fails to improve as anticipated.  I provided 60 minutes of non-face-to-face time during this encounter including interviewing the patient/care giver, review of prior medical records/imagings/microbiological data and documentation in the  EMR on the day of visit.  Wilber Oliphant, [place] for [facility] Phone [phone] Fax no. [phone]  02/24/2020, 4:07 PM

## 2020-02-24 NOTE — Telephone Encounter (Signed)
Infectious Disease called stating that office notes, OP notes, and culture results need to be faxed to  [phone], attn: Dr. Megan Salon.  Cb# [phone].  Please advise.  Thank you.

## 2020-02-24 NOTE — Telephone Encounter (Signed)
Received call from patient's daughter, she is not currently with the patient right now. She states the patient has a hard time getting to her phone, but that she will find out if the patient would rather do an in-person visit next week. Patient's daughter will call us back and let us know.   Beryle Flock, RN

## 2020-02-24 NOTE — Telephone Encounter (Signed)
Call placed to Dr. Phoebe Sharps office for culture results. Provided office staff with main fax number as fax in triage is temporary out of order.   Also attempted to contact patient to reschedule to next week for in person visit. Per Dr. Megan Salon if patient is asymptomatic of covid symptoms ok for in person visit. Patient is currently in isolation, living in same home with sister who is positive of covid.   Will continue to follow.  Olivia Ewing

## 2020-02-24 NOTE — Telephone Encounter (Signed)
Called IR to schedule PICC line placement for IV abx. Left voicemail for [name] Pina to return call with appt time. Forwarding to triage for follow up. Abx orders and short stay orders signed and placed in triage to be faxed.   Alaria Oconnor Lorita Officer, RN

## 2020-02-24 NOTE — Telephone Encounter (Signed)
FAXED

## 2020-02-24 NOTE — Telephone Encounter (Signed)
Spoke with [name] Pina in IR, patient scheduled for PICC placement 03/01/20 at 10 am. Notified [name] Pina that patient will be receiving first dose in short stay.   Beryle Flock, RN

## 2020-02-24 NOTE — Telephone Encounter (Signed)
I am hopeful that she comes in to see Dr. Werner Lean today - she really needs a PICC line for course of therapy based on what I read from her chart.

## 2020-02-24 NOTE — Progress Notes (Unsigned)
Error

## 2020-02-25 ENCOUNTER — Telehealth: Payer: Self-pay

## 2020-02-25 DIAGNOSIS — M009 Pyogenic arthritis, unspecified: Secondary | ICD-10-CM

## 2020-02-25 DIAGNOSIS — Z91199 Patient's noncompliance with other medical treatment and regimen due to unspecified reason: Secondary | ICD-10-CM | POA: Insufficient documentation

## 2020-02-25 DIAGNOSIS — Z5181 Encounter for therapeutic drug level monitoring: Secondary | ICD-10-CM | POA: Insufficient documentation

## 2020-02-25 HISTORY — DX: Pyogenic arthritis, unspecified: M00.9

## 2020-02-25 LAB — ACID FAST SMEAR (AFB, MYCOBACTERIA): Acid Fast Smear: NEGATIVE

## 2020-02-25 NOTE — Telephone Encounter (Signed)
RN spoke with patient's daughter to relay PICC placement appointment time of 03/01/20 at 10am and location at [facility] first floor radiology. She is agreeable to the appointment. RN faxed orders to [facility] and short stay. Relayed appointment time to [facility] at [facility] and let her know patient will be receiving first dose at short stay after PICC placement.   Patient's daughter states that the patient sent her a picture of her shoulder dressing last night and there is blood collecting underneath it. Patient does not have home health, there is an elderly woman who sometimes goes to the house to help the patient. Patient's daughter also states that patient has fallen, she is on the floor and cannot get up. The daughter is in quarantine and unable to go assist her. RN advised her to call EMS for the patient, she verbalized understanding.   Beryle Flock, RN

## 2020-02-25 NOTE — Telephone Encounter (Signed)
-----   Message from Carlean Purl, RN sent at 02/25/2020 10:16 AM EST ----- Regarding: FW: FU  ----- Message ----- From: Rosiland Oz, MD Sent: 02/25/2020   7:41 AM EST To: Carlean Purl, RN Subject: FU                                             Dorie,   I dont see a fu schedule for her in 2 weeks ??

## 2020-02-25 NOTE — Telephone Encounter (Signed)
Spoke with the patient's daughter to schedule a 2 week follow-up. She is very overwhelmed right now and does not want to schedule until after her quarantine is over and until after the patient has her PICC in place. She will call us back to schedule a follow up when she has a better idea of her availability.   Beryle Flock, RN

## 2020-02-25 NOTE — Telephone Encounter (Signed)
OK 

## 2020-02-25 NOTE — Assessment & Plan Note (Signed)
Start cefazolin 2g iv q8 hrs Plan to treat for 4 weeks  Follow up with Orthopedics as recommended

## 2020-02-25 NOTE — Telephone Encounter (Signed)
Attempted to call patient's daughter to schedule follow up appointment in 2 weeks, no answer. Left HIPAA compliant voicemail requesting callback.   Beryle Flock, RN

## 2020-02-25 NOTE — Telephone Encounter (Signed)
Spoke with patient's daughter, she was able to call the patient's roommate and they went back to the house to assist the patient up from her fall. Patient's daughter states she will call the office to schedule follow-up once she is out of quarantine and has a better idea of her availability.   Beryle Flock, RN

## 2020-02-25 NOTE — Assessment & Plan Note (Signed)
Baseline CRP and ESR

## 2020-02-26 LAB — AEROBIC/ANAEROBIC CULTURE W GRAM STAIN (SURGICAL/DEEP WOUND): Gram Stain: NONE SEEN

## 2020-02-28 ENCOUNTER — Other Ambulatory Visit (HOSPITAL_COMMUNITY): Payer: Self-pay | Admitting: *Deleted

## 2020-02-29 ENCOUNTER — Other Ambulatory Visit: Payer: Self-pay

## 2020-02-29 DIAGNOSIS — I10 Essential (primary) hypertension: Secondary | ICD-10-CM

## 2020-02-29 DIAGNOSIS — F430 Acute stress reaction: Secondary | ICD-10-CM

## 2020-02-29 DIAGNOSIS — F32A Depression, unspecified: Secondary | ICD-10-CM

## 2020-02-29 MED ORDER — LISINOPRIL-HYDROCHLOROTHIAZIDE 20-12.5 MG PO TABS: 1.0000 | ORAL_TABLET | Freq: Every day | ORAL | 0 refills | Status: DC

## 2020-02-29 MED ORDER — SERTRALINE HCL 50 MG PO TABS: 50.0000 mg | ORAL_TABLET | Freq: Every day | ORAL | 2 refills | Status: DC

## 2020-02-29 NOTE — Telephone Encounter (Signed)
  amLODipine (NORVASC) 5 MG tablet   promethazine (PHENERGAN) 25 MG tablet  lisinopril-hydrochlorothiazide (ZESTORETIC) 20-12.5 MG tablet  sertraline (ZOLOFT) 50 MG tablet, refill request @  [facility] [facility], [place] Phone:  [phone]  Fax:  [phone]

## 2020-03-01 ENCOUNTER — Other Ambulatory Visit: Payer: Self-pay

## 2020-03-01 ENCOUNTER — Telehealth: Payer: Self-pay | Admitting: *Deleted

## 2020-03-01 ENCOUNTER — Telehealth: Payer: Self-pay | Admitting: Orthopaedic Surgery

## 2020-03-01 ENCOUNTER — Other Ambulatory Visit: Payer: Self-pay | Admitting: Infectious Diseases

## 2020-03-01 ENCOUNTER — Telehealth: Payer: Self-pay

## 2020-03-01 ENCOUNTER — Ambulatory Visit (HOSPITAL_COMMUNITY)
Admission: RE | Admit: 2020-03-01 | Discharge: 2020-03-01 | Disposition: A | Discharge status: Home / Self Care | Payer: Medicare HMO | Source: Ambulatory Visit | Attending: Infectious Diseases | Admitting: Infectious Diseases

## 2020-03-01 ENCOUNTER — Other Ambulatory Visit: Payer: Self-pay | Admitting: Physician Assistant

## 2020-03-01 ENCOUNTER — Encounter (HOSPITAL_COMMUNITY)
Admission: RE | Admit: 2020-03-01 | Discharge: 2020-03-01 | Disposition: A | Discharge status: Home / Self Care | Payer: Medicare HMO | Source: Ambulatory Visit | Attending: Infectious Diseases | Admitting: Infectious Diseases

## 2020-03-01 ENCOUNTER — Other Ambulatory Visit: Payer: Medicare HMO

## 2020-03-01 DIAGNOSIS — M00019 Staphylococcal arthritis, unspecified shoulder: Secondary | ICD-10-CM

## 2020-03-01 DIAGNOSIS — M009 Pyogenic arthritis, unspecified: Secondary | ICD-10-CM | POA: Insufficient documentation

## 2020-03-01 MED ORDER — LIDOCAINE HCL 1 % IJ SOLN
INTRAMUSCULAR | Status: AC
  Filled 2020-03-01: qty 20

## 2020-03-01 MED ORDER — OXYCODONE-ACETAMINOPHEN 7.5-325 MG PO TABS: 1.0000 | ORAL_TABLET | Freq: Four times a day (QID) | ORAL | 0 refills | Status: DC | PRN

## 2020-03-01 MED ORDER — CEFAZOLIN SODIUM-DEXTROSE 2-4 GM/100ML-% IV SOLN
2.0000 g | Freq: Once | INTRAVENOUS | Status: AC
  Administered 2020-03-01: 2 g via INTRAVENOUS
  Filled 2020-03-01: qty 100

## 2020-03-01 MED ORDER — LIDOCAINE HCL (PF) 1 % IJ SOLN
INTRAMUSCULAR | Status: DC | PRN
  Administered 2020-03-01: 5 mL

## 2020-03-01 MED ORDER — HEPARIN SOD (PORK) LOCK FLUSH 100 UNIT/ML IV SOLN
250.0000 [IU] | INTRAVENOUS | Status: DC | PRN
  Administered 2020-03-01: 250 [IU]

## 2020-03-01 MED ORDER — HEPARIN SOD (PORK) LOCK FLUSH 100 UNIT/ML IV SOLN
INTRAVENOUS | Status: AC
  Filled 2020-03-01: qty 5

## 2020-03-01 MED ORDER — HEPARIN SOD (PORK) LOCK FLUSH 100 UNIT/ML IV SOLN: 250.0000 [IU] | Freq: Every day | INTRAVENOUS | Status: DC

## 2020-03-01 NOTE — Telephone Encounter (Signed)
Tamu the pts daughter called stating the pt had surgery for a torn rotator cuff and now her entire arm is infected; Tamu states her mother needs a home nurse as she has a line in her arm that she has to administer her own medicine through. Tamu is very upset that no one from our office has called her back and she doesn't feel like we are providing accurate care; Tamu would like a CB ASAP to discuss this further. Tamu only wants to speak with Dr.Xu   [phone]

## 2020-03-01 NOTE — Telephone Encounter (Signed)
Patient's daughter called. Would like a call back. Has some questions about mom's care. [phone]

## 2020-03-01 NOTE — Telephone Encounter (Signed)
Darlene, physical therapist at [facility], called to clarify where the patient has home health nursing set up. Patient's PICC was placed today and she has had her first antibiotic infusion. Darlene is trying to set up physical therapy, but this service cannot be at a different agency than her nursing service.  Darlene will contact Carolynn Sayers at [facility] for more information, as the nursing orders have already started.  Landis Gandy, RN

## 2020-03-01 NOTE — Telephone Encounter (Signed)
OK 

## 2020-03-01 NOTE — Telephone Encounter (Signed)
See message below  Please advise

## 2020-03-01 NOTE — Telephone Encounter (Signed)
Sent in meds.  She needs to come in to see [name] tomorrow

## 2020-03-01 NOTE — Telephone Encounter (Signed)
PLEASE call patients daughter. She would like CB from you ASAP. I did advise her you are in Surgery this PM

## 2020-03-01 NOTE — Progress Notes (Signed)
Carolynn Sayers from home health was here to instruct pt on home antibiotic therapy.

## 2020-03-01 NOTE — Telephone Encounter (Signed)
Pt needs her rx sent to the [facility] on [address] and she would like to be notified when this is done

## 2020-03-01 NOTE — Procedures (Signed)
PROCEDURE SUMMARY:  Successful placement of image-guided single lumen PICC line to the left basilic vein. Length 43 cm. Tip at lower SVC/RA. No complications. EBL = <3. Ready for use.  Please see imaging section of Epic for full dictation.   Theresa Duty, NP 03/01/2020 10:39 AM

## 2020-03-01 NOTE — Telephone Encounter (Signed)
Called pt daughter about making an appt for 1 wk post op.  She couldn't confirm appt so I called pt and she didn't answer.

## 2020-03-01 NOTE — Telephone Encounter (Signed)
Pt called staying [facility] is coming tomorrow between 8-9 to eval her pick line.  She states she can't come in tomorrow due to lack of transportation and would like for liz to give her a call

## 2020-03-01 NOTE — Telephone Encounter (Signed)
Pt called is at [facility] now getting her pick line put in.  She would like her oxycodone pain medication sent in. She is completey out and her pain level is on a 10.   Pt states that her right shoulder is swollen but doesn't have heat at the moment and her stiches are still tintack but are very painful

## 2020-03-02 ENCOUNTER — Telehealth: Payer: Self-pay

## 2020-03-02 ENCOUNTER — Ambulatory Visit: Payer: Medicare HMO | Admitting: Orthopaedic Surgery

## 2020-03-02 NOTE — Telephone Encounter (Signed)
Patient called she stated she cant make it to her appointment this morning due to her nurse arriving late she has been rescheduled to tomorrow 1/21 at 10:15 CB:[phone]

## 2020-03-02 NOTE — Telephone Encounter (Signed)
If cannot come then, needs to come Friday am

## 2020-03-02 NOTE — Telephone Encounter (Signed)
Called to get patient an appointment scheduled with Dr. West Bali, left a voicemail to give [name] a call back and get it scheduled

## 2020-03-03 ENCOUNTER — Telehealth: Payer: Self-pay | Admitting: Physician Assistant

## 2020-03-03 ENCOUNTER — Ambulatory Visit: Payer: Medicare HMO | Admitting: Orthopaedic Surgery

## 2020-03-03 NOTE — Telephone Encounter (Signed)
Patient was initially scheduled to come in for her first post-op visit yesterday, 03/02/20, but rescheduled to today, 03/03/20 and subsequently did not show up for her appointment.

## 2020-03-03 NOTE — Progress Notes (Deleted)
Patient was initially scheduled to come in for her first post-op visit yesterday, 03/02/20, but rescheduled to today, 03/03/20 and subsequently did not show up for her appointment.  °

## 2020-03-06 ENCOUNTER — Other Ambulatory Visit: Payer: Self-pay | Admitting: Internal Medicine

## 2020-03-06 DIAGNOSIS — I10 Essential (primary) hypertension: Secondary | ICD-10-CM

## 2020-03-06 NOTE — Telephone Encounter (Signed)
Patient called in stating she vomited in the Rx bottle of lisinopril-HCTZ that was sent on 02/29/2020. She is requesting a 25 day supply be sent to [facility]. Hubbard Hartshorn, BSN, RN-BC

## 2020-03-06 NOTE — Telephone Encounter (Signed)
Just an FYI, her appointment with me has not been scheduled yet.

## 2020-03-06 NOTE — Telephone Encounter (Signed)
Patient scheduled for 04/10/20.   Beryle Flock, RN

## 2020-03-06 NOTE — Telephone Encounter (Signed)
OK, thx.

## 2020-03-06 NOTE — Telephone Encounter (Signed)
Feb 28 is too far out, I would like her to see before feb 16 if possible

## 2020-03-07 ENCOUNTER — Telehealth: Payer: Self-pay | Admitting: Orthopaedic Surgery

## 2020-03-07 NOTE — Telephone Encounter (Signed)
Olivia Ewing with [facility] called wanting to make Dr.Xu aware the pt refused her PT eval that was supposed to take place last weekend and delayed it until sometime early this week.   Verdis Frederickson CB# [phone]

## 2020-03-07 NOTE — Telephone Encounter (Signed)
No post op appt is made either

## 2020-03-07 NOTE — Telephone Encounter (Signed)
Ok, thanks.

## 2020-03-07 NOTE — Telephone Encounter (Signed)
Can you please call patient and make appt. Thanks.

## 2020-03-07 NOTE — Telephone Encounter (Signed)
Called pt tried to lvm but mailbox was full. Will try again later

## 2020-03-07 NOTE — Telephone Encounter (Signed)
Needs appt with me for postop

## 2020-03-08 ENCOUNTER — Ambulatory Visit: Payer: Medicare HMO | Admitting: Specialist

## 2020-03-08 ENCOUNTER — Encounter: Payer: Self-pay | Admitting: Specialist

## 2020-03-10 ENCOUNTER — Encounter: Payer: Self-pay | Admitting: Infectious Diseases

## 2020-03-10 ENCOUNTER — Other Ambulatory Visit: Payer: Self-pay

## 2020-03-10 ENCOUNTER — Ambulatory Visit (INDEPENDENT_AMBULATORY_CARE_PROVIDER_SITE_OTHER): Payer: Medicare HMO | Admitting: Infectious Diseases

## 2020-03-10 VITALS — BP 128/79 | HR 82 | Temp 97.9°F | Wt 184.0 lb

## 2020-03-10 DIAGNOSIS — M00019 Staphylococcal arthritis, unspecified shoulder: Secondary | ICD-10-CM

## 2020-03-10 NOTE — Progress Notes (Signed)
[facility]                                                             [place], [place], [place], [address]                                                                  Phn. [phone]; Fax: [phone]                                                                             Date: 03/10/20  Reason for Follow Up: Septic arthritis  Assessment 70 Year old Female with a PMH of Total Rt hip replacement, rt biceps tendinosis, partial rotator cuff tear for which she  underwent RT shoulder arthroscopy with distal clavicle excision, extensive debridement of supraspinatus, infraspinatus,  Labrum, biceps, synovitis and subacromial bursitis and subacromial decompression with acromioplasty and ca ligament release on 08/25/19 and s/p I and D of Rt AC joint and rt shoulder deep abscess 02/21/20 with OR  cx growing MSSA who is here for follow up of RT shoulder septic arthritis   Plan PICC line placed on 03/01/20 with IV cefazolin started on same day Will request lab from [facility] for medication safety monitoring  Fu in 3 weeks, around tentative end date of antibiotics 03/29/20 Counseled on COVID vaccines  All questions and concerns were discussed and addressed. Patient verbalized understanding of the plan. ____________________________________________________________________________________________________________________ History of Present Illness: 70 Year old Female with a PMH of Total Rt hip replacement, rt biceps tendinosis, partial rotator cuff tear for which she  underwent RT shoulder arthroscopy with distal clavicle excision, extensive debridement of supraspinatus, infraspinatus,  Labrum, biceps, synovitis and subacromial bursitis and subacromial decompression with acromioplasty and ca ligament release on 08/25/19  She was seen in 12/21/19 with Ortho for evaluation of painful knot in the rt shoulder  approx 3 months post Rt shoulder arthroscopy which developed approx 3 weeks ago.  MRI rt shoulder 11/11 IMPRESSION: 1. Interval postsurgical changes of the [facility] joint with new moderate amount of fluid in the subacromial/subdeltoid bursa communicating with the [facility] joint space and extending into a small 8 mm fluid collection just superior to the joint, accounting for the palpable abnormality. 2. Continued severe supraspinatus tendinosis with new focal full-thickness defect at the anterior margin of the proximal tendon. Worsened high-grade partial-thickness articular surface tear anteriorly at the insertion and largely unchanged high-grade partial-thickness articular surface tear of the mid to distal tendon. 3. Unchanged severe subscapularis tendinosis with low-grade partial-thickness articular surface tear at the insertion superiorly. 4. New small tear of the posterior labrum.  She had US guided injection of steroids. An attempt was made to aspirate the [facility] joint nodule which was hypoechoic without a significant fluid collection  to be aspirated. Patient was seen by Dr Erlinda Hong on 12/30 where there was concerns of rt shoulder mass cs abscess vs gouty manifestation and underwent I and D of rt AC joint and rt shoulder deed abscess on 1/10.   OR note reviewed in detail which mentions frank purulence. Fluid and tissue cx sent which is growing MSSA in day 3. Half a gram of vancomycin powder was placed in the surgical wound.   I spoke with the patient but mostly with her adopted aunt given patient was in severe pain. Denies any fevers, chills, sweats, nausea, vomiting, abdominal pain and diarrhea. She has restricted ROM in the rt shoulder due to pain. She has been taking Bactrim post sx on 02/21/19  Subjective 03/10/20 Neely is here for follow up of septic arthritis of rt shoulder. She says pain has improved from 30/10 to 7/10. Denies any fever, chills and sweats. Denies any side effects like  N/V/D/rashes. Has been injectiing UV cefazolin three times a day without any issues. Denies any issues with PICC line. She will be following up with ortho. Overall feels well and no new complaints today.   ROS: 12 point ROS done with pertinent positives and negatives listed above  Past Medical History:  Diagnosis Date  . Anemia   . Arthritis    knees hands  . Bipolar disorder ([place])   . Cervicalgia   . Depression   . Environmental allergies    cause SOB, uses inhaler for  . Environmental and seasonal allergies    uses inhaler prn  . Full dentures   . GERD (gastroesophageal reflux disease)    diet controlled - no meds  . Hyperlipidemia    diet controlled, no meds  . Hypertension   . Lumbar radiculopathy, chronic   . Right knee pain    posterior horn medial meniscal tear MRI 2013  . Spinal stenosis    getting epidural injections -last one 06/12/2015  . SVD (spontaneous vaginal delivery)    x 4   Past Surgical History:  Procedure Laterality Date  . APPENDECTOMY    . Bladder tack  10/2006   cystocoele  . BREAST SURGERY Right    benign cyst  . CARPAL TUNNEL RELEASE Left 10/25/2019   Procedure: LEFT CARPAL TUNNEL RELEASE;  Surgeon: Jessy Oto, MD;  Location: [facility];  Service: Orthopedics;  Laterality: Left;  . COLONOSCOPY    . Hysterectomy other    . IRRIGATION AND DEBRIDEMENT SHOULDER Right 02/21/2020   Procedure: IRRIGATION AND DEBRIDEMENT RIGHT SHOULDER;  Surgeon: Leandrew Koyanagi, MD;  Location: [facility];  Service: Orthopedics;  Laterality: Right;  . LUMBAR LAMINECTOMY N/A 03/16/2019   Procedure: CENTRAL LAMINECTOMIES L2-3, L3-4 AND L4-5;  Surgeon: Jessy Oto, MD;  Location: [facility];  Service: Orthopedics;  Laterality: N/A;  . TOTAL HIP ARTHROPLASTY Right 04/27/2018   Procedure: RIGHT TOTAL HIP ARTHROPLASTY ANTERIOR APPROACH;  Surgeon: Leandrew Koyanagi, MD;  Location: [facility];  Service: Orthopedics;  Laterality: Right;  . TUBAL LIGATION      Current Outpatient Medications on File Prior to Visit  Medication Sig Dispense Refill  . albuterol (VENTOLIN HFA) 108 (90 Base) MCG/ACT inhaler Inhale 1-2 puffs into the lungs every 6 (six) hours as needed for wheezing or shortness of breath. 6.7 g 8  . amLODipine (NORVASC) 5 MG tablet TAKE 1 TABLET BY MOUTH DAILY 90 tablet 1  . ferrous gluconate (FERGON) 324 MG tablet Take 1 tablet (324 mg total)  by mouth daily with breakfast. 60 tablet 0  . fluticasone (FLONASE) 50 MCG/ACT nasal spray PLACE 1-2 SPRAYS INTO BOTH NOSTRILS DAILY. 16 mL 2  . gabapentin (NEURONTIN) 300 MG capsule Take 300 mg by mouth 3 (three) times daily.    Marland Kitchen lisinopril-hydrochlorothiazide (ZESTORETIC) 20-12.5 MG tablet TAKE 1 TABLET BY MOUTH DAILY. 30 tablet 0  . Multiple Vitamin (MULTIVITAMIN WITH MINERALS) TABS tablet Take 1 tablet by mouth daily.    . promethazine (PHENERGAN) 25 MG tablet     . QUEtiapine (SEROQUEL) 50 MG tablet Take 50 mg by mouth at bedtime.    . rosuvastatin (CRESTOR) 20 MG tablet Take 1 tablet (20 mg total) by mouth daily. 90 tablet 1  . sertraline (ZOLOFT) 50 MG tablet Take 1 tablet (50 mg total) by mouth daily. 30 tablet 2  . cephALEXin (KEFLEX) 500 MG capsule Take 1 capsule (500 mg total) by mouth 4 (four) times daily. (Patient not taking: Reported on 03/10/2020) 40 capsule 0  . cephALEXin (KEFLEX) 500 MG capsule Take 1 capsule (500 mg total) by mouth 4 (four) times daily. (Patient not taking: Reported on 03/10/2020) 120 capsule 0  . diclofenac (VOLTAREN) 75 MG EC tablet TAKE 1 TABLET BY MOUTH TWICE A DAY (Patient not taking: Reported on 03/10/2020) 60 tablet 2  . methocarbamol (ROBAXIN) 500 MG tablet Take 1 tablet (500 mg total) by mouth 3 (three) times daily as needed. (Patient not taking: Reported on 03/10/2020) 30 tablet 0  . oxyCODONE-acetaminophen (PERCOCET) 7.5-325 MG tablet Take 1-2 tablets by mouth every 6 (six) hours as needed for severe pain. Take 1-2 every 6 hours prn pain (Patient not taking:  Reported on 03/10/2020) 20 tablet 0   No current facility-administered medications on file prior to visit.   Allergies  Allergen Reactions  . Tramadol Other (See Comments)    Hallucinate, nightmare    Social History   Socioeconomic History  . Marital status: Divorced    Spouse name: Not on file  . Number of children: 4  . Years of education: Not on file  . Highest education level: Not on file  Occupational History  . Not on file  Tobacco Use  . Smoking status: Former Smoker    Packs/day: 0.10    Quit date: 11/26/1983    Years since quitting: 36.3  . Smokeless tobacco: Never Used  Vaping Use  . Vaping Use: Never used  Substance and Sexual Activity  . Alcohol use: Yes    Alcohol/week: 0.0 standard drinks    Comment: rarely  . Drug use: Not Currently    Types: Marijuana    Comment: last used 2019  . Sexual activity: Not Currently    Birth control/protection: Surgical  Other Topics Concern  . Not on file  Social History Narrative   Single. 9 siblings. 1 brother with HIV, 1 sister with hypertension. 4 children, 1 daughter with HTN and depression, 1 son with HTN.   [phone]- shelter's number.    Former smoker, no alcohol or drug use.   Social Determinants of Health   Financial Resource Strain: Not on file  Food Insecurity: Not on file  Transportation Needs: Not on file  Physical Activity: Not on file  Stress: Not on file  Social Connections: Not on file  Intimate Partner Violence: Not on file    Vitals BP 128/79   Pulse 82   Temp 97.9 F (36.6 C) (Oral)   Wt 184 lb (83.5 kg)   BMI 30.62 kg/m  Examination  General - not in acute distress, comfortably sitting in chair HEENT - PEERLA, no pallor and no icterus Chest - b/l clear air entry, no additional sounds CVS- Normal s1s2, RRR Abdomen - Soft, Non tender , non distended Ext- no pedal edema Rt shoulder- no signs of septic arthritis, minimal tenderness and restricted mobility PICC line site - no  tenderness/swelling or erythema Neuro: grossly normal Back - WNL Psych : calm and cooperative  Recent labs CBC Latest Ref Rng & Units 11/04/2019 10/21/2019 08/24/2019  WBC 3.4 - 10.8 x10E3/uL 5.0 5.5 7.7  Hemoglobin 11.1 - 15.9 g/dL 9.9(L) 9.4(L) 9.1(L)  Hematocrit 34.0 - 46.6 % 33.3(L) 32.3(L) 31.3(L)  Platelets 150 - 400 K/uL - 354 332   CMP Latest Ref Rng & Units 02/18/2020 10/21/2019 08/24/2019  Glucose 70 - 99 mg/dL 122(H) 94 110(H)  BUN 8 - 23 mg/dL <5(L) 5(L) 8  Creatinine 0.44 - 1.00 mg/dL 0.86 0.72 0.85  Sodium 135 - 145 mmol/L 137 139 140  Potassium 3.5 - 5.1 mmol/L 3.6 4.3 3.8  Chloride 98 - 111 mmol/L 102 104 105  CO2 22 - 32 mmol/L 22 26 25   Calcium 8.9 - 10.3 mg/dL 9.5 9.3 9.0  Total Protein 6.5 - 8.1 g/dL - 6.7 -  Total Bilirubin 0.3 - 1.2 mg/dL - 0.3 -  Alkaline Phos 38 - 126 U/L - 81 -  AST 15 - 41 U/L - 24 -  ALT 0 - 44 U/L - 20 -     Pertinent Microbiology Results for orders placed or performed during the hospital encounter of 02/21/20  Fungus Culture With Stain     Status: None (Preliminary result)   Collection Time: 02/21/20  1:13 PM   Specimen: PATH Other; Tissue  Result Value Ref Range Status   Fungus Stain Final report  Final    Comment: (NOTE) Performed At: [facility] [place], [place] [id number] Rush Farmer MD [id number]    Fungus (Mycology) Culture PENDING  Incomplete   Fungal Source ABSCESS  Final    Comment: RIGHT SHOULDER SPEC A Performed at [facility], [address], [place], [place] [address]   Aerobic/Anaerobic Culture (surgical/deep wound)     Status: None   Collection Time: 02/21/20  1:13 PM   Specimen: PATH Other; Tissue  Result Value Ref Range Status   Specimen Description ABSCESS  Final   Special Requests RIGHT SHOULDER SPEC A  Final   Gram Stain   Final    MODERATE WBC PRESENT, PREDOMINANTLY PMN FEW GRAM POSITIVE COCCI    Culture   Final    FEW STAPHYLOCOCCUS AUREUS NO ANAEROBES  ISOLATED Performed at [facility], [place] [address] [place], [place] [address]    Report Status 02/26/2020 FINAL  Final   Organism ID, Bacteria STAPHYLOCOCCUS AUREUS  Final      Susceptibility   Staphylococcus aureus - MIC*    CIPROFLOXACIN <=0.5 SENSITIVE Sensitive     ERYTHROMYCIN >=8 RESISTANT Resistant     GENTAMICIN <=0.5 SENSITIVE Sensitive     OXACILLIN 0.5 SENSITIVE Sensitive     TETRACYCLINE <=1 SENSITIVE Sensitive     VANCOMYCIN 1 SENSITIVE Sensitive     TRIMETH/SULFA <=10 SENSITIVE Sensitive     CLINDAMYCIN <=0.25 SENSITIVE Sensitive     RIFAMPIN <=0.5 SENSITIVE Sensitive     Inducible Clindamycin NEGATIVE Sensitive     * FEW STAPHYLOCOCCUS AUREUS  Acid Fast Smear (AFB)     Status: None  Collection Time: 02/21/20  1:13 PM   Specimen: PATH Other; Tissue  Result Value Ref Range Status   AFB Specimen Processing Concentration  Final   Acid Fast Smear Negative  Final    Comment: (NOTE) Performed At: [facility] [place], [place] [id number] Rush Farmer MD [id number]    Source (AFB) ABSCESS  Final    Comment: RIGHT SHOULDER SPEC A Performed at [facility], [place] [address], [place], [place] [address]   Fungus Culture Result     Status: None   Collection Time: 02/21/20  1:13 PM  Result Value Ref Range Status   Result 1 Comment  Final    Comment: (NOTE) KOH/Calcofluor preparation:  no fungus observed. Performed At: [facility] [place], [place] [id number] Rush Farmer MD [id number]   Fungus Culture With Stain     Status: None (Preliminary result)   Collection Time: 02/21/20  1:26 PM   Specimen: PATH Other; Tissue  Result Value Ref Range Status   Fungus Stain Final report  Final    Comment: (NOTE) Performed At: [facility] [place], [place] [id number] Rush Farmer MD [id number]    Fungus (Mycology) Culture PENDING  Incomplete   Fungal Source TISSUE  Final     Comment: RIGHT SHOULDER SPEC B Performed at [facility], [place] [address], [place], [place] [address]   Aerobic/Anaerobic Culture (surgical/deep wound)     Status: None   Collection Time: 02/21/20  1:26 PM   Specimen: PATH Other; Tissue  Result Value Ref Range Status   Specimen Description TISSUE  Final   Special Requests RIGHT SHOULDER SPEC B  Final   Gram Stain NO WBC SEEN ABUNDANT GRAM POSITIVE COCCI   Final   Culture   Final    MODERATE STAPHYLOCOCCUS AUREUS NO ANAEROBES ISOLATED Performed at [facility], [place] [address], [place], [place] [address]    Report Status 02/26/2020 FINAL  Final   Organism ID, Bacteria STAPHYLOCOCCUS AUREUS  Final      Susceptibility   Staphylococcus aureus - MIC*    CIPROFLOXACIN <=0.5 SENSITIVE Sensitive     ERYTHROMYCIN >=8 RESISTANT Resistant     GENTAMICIN <=0.5 SENSITIVE Sensitive     OXACILLIN 0.5 SENSITIVE Sensitive     TETRACYCLINE <=1 SENSITIVE Sensitive     VANCOMYCIN 1 SENSITIVE Sensitive     TRIMETH/SULFA <=10 SENSITIVE Sensitive     CLINDAMYCIN <=0.25 SENSITIVE Sensitive     RIFAMPIN <=0.5 SENSITIVE Sensitive     Inducible Clindamycin NEGATIVE Sensitive     * MODERATE STAPHYLOCOCCUS AUREUS  Acid Fast Smear (AFB)     Status: None   Collection Time: 02/21/20  1:26 PM   Specimen: PATH Other; Tissue  Result Value Ref Range Status   AFB Specimen Processing Concentration  Final   Acid Fast Smear Negative  Final    Comment: (NOTE) Performed At: [facility] [place], [place] [id number] Rush Farmer MD [id number]    Source (AFB) TISSUE  Final    Comment: RIGHT SHOULDER SPEC B Performed at [facility], [place] [address] [place], [place] [address]   Fungus Culture Result     Status: None   Collection Time: 02/21/20  1:26 PM  Result Value Ref Range Status   Result 1 Comment  Final    Comment: (NOTE) KOH/Calcofluor preparation:  no fungus observed. Performed At: [facility] [address] [place], [place] [id number]  Rush Farmer MD [id number]     All pertinent labs/Imagings/notes reviewed. All pertinent plain films and CT images have been personally visualized and interpreted; radiology reports have been reviewed. Decision making incorporated into the Impression / Recommendations.  I spent greater than 25 minutes with the patient including  review of prior medical records with greater than 50% of time in face to face counsel of the patient.    Electronically signed by:  Rosiland Oz, MD Infectious Disease Physician [facility] [address] [place], [place] [address] Phone: [phone]  Fax: [phone]

## 2020-03-11 ENCOUNTER — Encounter: Payer: Self-pay | Admitting: Orthopaedic Surgery

## 2020-03-11 NOTE — Addendum Note (Signed)
Addended by: Rosiland Oz on: 03/11/2020 04:23 PM   Modules accepted: Orders

## 2020-03-12 ENCOUNTER — Encounter: Payer: Self-pay | Admitting: Orthopaedic Surgery

## 2020-03-13 ENCOUNTER — Encounter: Payer: Self-pay | Admitting: Infectious Diseases

## 2020-03-13 ENCOUNTER — Other Ambulatory Visit: Payer: Self-pay | Admitting: Orthopaedic Surgery

## 2020-03-13 MED ORDER — HYDROCODONE-ACETAMINOPHEN 5-325 MG PO TABS: 1.0000 | ORAL_TABLET | Freq: Four times a day (QID) | ORAL | 0 refills | Status: DC | PRN

## 2020-03-14 ENCOUNTER — Encounter: Payer: Medicare HMO | Admitting: Student

## 2020-03-14 ENCOUNTER — Encounter: Payer: Self-pay | Admitting: Infectious Diseases

## 2020-03-14 NOTE — Progress Notes (Signed)
Labs  03/06/20 Cr 0.7, WBC 9, hb 7.4, platelets 720, ESR 90, CRP 68

## 2020-03-17 NOTE — Telephone Encounter (Signed)
Happy to send in medicine once she sees [name].  She has either cancelled/no showed her post-op appt.  She needs to be seen ASAP

## 2020-03-18 ENCOUNTER — Other Ambulatory Visit: Payer: Self-pay

## 2020-03-18 ENCOUNTER — Emergency Department: Payer: Self-pay

## 2020-03-18 ENCOUNTER — Emergency Department (HOSPITAL_COMMUNITY)
Admission: EM | Admit: 2020-03-18 | Discharge: 2020-03-18 | Disposition: A | Discharge status: Home / Self Care | Payer: Medicare HMO | Attending: Emergency Medicine | Admitting: Emergency Medicine

## 2020-03-18 ENCOUNTER — Encounter (HOSPITAL_COMMUNITY): Payer: Self-pay | Admitting: Emergency Medicine

## 2020-03-18 DIAGNOSIS — Z95828 Presence of other vascular implants and grafts: Secondary | ICD-10-CM

## 2020-03-18 DIAGNOSIS — T82524A Displacement of infusion catheter, initial encounter: Secondary | ICD-10-CM | POA: Insufficient documentation

## 2020-03-18 DIAGNOSIS — X58XXXA Exposure to other specified factors, initial encounter: Secondary | ICD-10-CM | POA: Diagnosis not present

## 2020-03-18 DIAGNOSIS — M009 Pyogenic arthritis, unspecified: Secondary | ICD-10-CM | POA: Diagnosis not present

## 2020-03-18 MED ORDER — HYDROCODONE-ACETAMINOPHEN 5-325 MG PO TABS
1.0000 | ORAL_TABLET | Freq: Once | ORAL | Status: AC
  Administered 2020-03-18: 1 via ORAL
  Filled 2020-03-18: qty 1

## 2020-03-18 MED ORDER — CHLORHEXIDINE GLUCONATE CLOTH 2 % EX PADS: 6.0000 | MEDICATED_PAD | Freq: Every day | CUTANEOUS | Status: DC

## 2020-03-18 MED ORDER — SODIUM CHLORIDE 0.9% FLUSH: 10.0000 mL | Freq: Two times a day (BID) | INTRAVENOUS | Status: DC

## 2020-03-18 MED ORDER — SODIUM CHLORIDE 0.9% FLUSH: 10.0000 mL | INTRAVENOUS | Status: DC | PRN

## 2020-03-18 NOTE — ED Notes (Signed)
Signed and labelled consent place in Med Rec drawer for IV Team RN

## 2020-03-18 NOTE — Discharge Instructions (Signed)
Please make sure to follow-up with your orthopedic and infectious disease doctors.  Return to the ER for any new or worsening symptoms.

## 2020-03-18 NOTE — ED Notes (Signed)
Patient verbalizes understanding of discharge instructions. Opportunity for questioning and answers were provided. Armband removed by staff, pt discharged from ED.  

## 2020-03-18 NOTE — ED Provider Notes (Signed)
70 year old female followed by  Dr. Erlinda Hong (Orthopedics) and Dr. West Bali (ID) for right shoulder septic arthritis growing MSSA, current PICC line who presents for evaluation of pulled PICC line.  PICC line was placed 03/01/2020 with IV cefazolin. Apparently had a restless night sleep last night and pulled out the majority of her left PICC line.  No fever, chills, nausea, vomiting, pain.  No beeding or drainage from site.   ROS Gen- No fever Heart- No CP Pul- No SOB MSK- No pain Neuro- No paresthesias Skin- No bleeding drainage  PE Gen- no acute distress Pul- Normal work of breathing MSK- Full ROM Skin-PICC line to left upper arm significantly removed portion with approximately 1-2 inches of PICC line remaining in arm.  No active bleeding or drainage Neuro- Intact sensation Psyh- Appear anxious   MDM Unfortunately on exam patient has significantly removed portion of her PICC line while in her sleep last night.  We had to remove the remaining portion of her PICC line due to only 1 to 2 inches currently in site.  Area does not appear infected.  Given she does do daily IV antibiotics have placed consult for IV team to replace her PICC line.  Patient hemodynamically stable.    E was initiated and I personally evaluated the patient and placed orders (if any) at  3:22 PM on March 18, 2020.  The patient appears stable so that the remainder of the MSE may be completed by another provider.   Diania Co A, PA-C 03/18/20 1526    Blanchie Dessert, MD 03/19/20 2054

## 2020-03-18 NOTE — ED Triage Notes (Signed)
Pt states PICC line pulled partially out of L arm during the night while sleeping.  Pt very anxious.  Tip of PICC line remains in arm.

## 2020-03-18 NOTE — ED Notes (Signed)
Unable to reassess pain at this time. Sterile procedure in progress.

## 2020-03-18 NOTE — Progress Notes (Signed)
Peripherally Inserted Central Catheter Placement  The IV Nurse has discussed with the patient and/or persons authorized to consent for the patient, the purpose of this procedure and the potential benefits and risks involved with this procedure.  The benefits include less needle sticks, lab draws from the catheter, and the patient may be discharged home with the catheter. Risks include, but not limited to, infection, bleeding, blood clot (thrombus formation), and puncture of an artery; nerve damage and irregular heartbeat and possibility to perform a PICC exchange if needed/ordered by physician.  Alternatives to this procedure were also discussed.  Bard Power PICC patient education guide, fact sheet on infection prevention and patient information card has been provided to patient /or left at bedside.    PICC Placement Documentation  PICC Single Lumen 03/18/20 PICC Left Brachial 42 cm 0 cm (Active)  Indication for Insertion or Continuance of Line Home intravenous therapies (PICC only) 03/18/20 1815  Exposed Catheter (cm) 0 cm 03/18/20 1815  Site Assessment Clean;Intact;Dry 03/18/20 1815  Line Status Flushed;Saline locked;Blood return noted 03/18/20 1815  Dressing Type Transparent 03/18/20 1815  Dressing Status Clean;Dry;Intact 03/18/20 1815  Antimicrobial disc in place? Yes 03/18/20 1815  Dressing Intervention New dressing 03/18/20 1815  Dressing Change Due 03/25/20 03/18/20 1815       Gordan Payment 03/18/2020, 6:17 PM

## 2020-03-18 NOTE — ED Notes (Signed)
IV team arrived at bedside  

## 2020-03-18 NOTE — ED Provider Notes (Signed)
[facility] EMERGENCY DEPARTMENT Provider Note   CSN: [id number] Arrival date & time: 03/18/20  1426     History Chief Complaint  Patient presents with  . Vascular Access Problem    PICC line pulled out    Olivia Ewing is a 70 y.o. female.  HPI 70 year old female with a history of septic arthritis followed by Dr. Rigoberto Noel plus (orthopedics) and Dr. West Bali (ID) growing MSSA, with current PICC line who presents to the ER for evaluation of a pulled out PICC line.  She states it was placed on 03/01/2020 with IV cefazolin.  Apparently patient had a restless night sleep last night and pulled out the majority of her left PICC line.  No fevers, chills, nausea, vomiting arm pain.  No bleeding or drainage from the site.  She was evaluated by hand early PA-C in triage.    Past Medical History:  Diagnosis Date  . Anemia   . Arthritis    knees hands  . Bipolar disorder ([place])   . Cervicalgia   . Depression   . Environmental allergies    cause SOB, uses inhaler for  . Environmental and seasonal allergies    uses inhaler prn  . Full dentures   . GERD (gastroesophageal reflux disease)    diet controlled - no meds  . Hyperlipidemia    diet controlled, no meds  . Hypertension   . Lumbar radiculopathy, chronic   . Right knee pain    posterior horn medial meniscal tear MRI 2013  . Spinal stenosis    getting epidural injections -last one 06/12/2015  . SVD (spontaneous vaginal delivery)    x 4    Patient Active Problem List   Diagnosis Date Noted  . Septic arthritis ([place]) 02/25/2020  . Medication monitoring encounter 02/25/2020  . MSSA (methicillin susceptible Staphylococcus aureus) infection 02/24/2020  . Septic arthritis of right acromioclavicular joint ([place])   . Abscess of bursa of right shoulder 02/10/2020  . S/P arthroscopy of right shoulder 01/05/2020  . Carpal tunnel syndrome, left upper limb 10/25/2019    Class: Chronic  . Carpal tunnel syndrome, right upper  limb 10/25/2019    Class: Chronic  . Nontraumatic incomplete tear of right rotator cuff 07/27/2019  . Tendinopathy of right biceps tendon 07/27/2019  . Breast cancer screening by mammogram 06/07/2019  . Seasonal allergic rhinitis due to pollen 06/07/2019  . Colon cancer screening 06/07/2019  . Spinal stenosis, lumbar region, with neurogenic claudication 03/16/2019    Class: Chronic  . Status post lumbar laminectomy 03/16/2019  . Acute stress reaction 09/25/2018  . Primary osteoarthritis of right hip 04/27/2018  . Status post total hip replacement, 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  . Shortness of breath 01/02/2018  . Chalazion left upper eyelid 02/20/2017  . Vitamin D insufficiency 11/25/2013  . Healthcare maintenance 11/25/2013  . Spinal stenosis of lumbar region 07/02/2013  . Arthritis, degenerative 11/17/2012  . Arthrosis of right acromioclavicular joint 06/05/2012  . Rectocele 04/28/2012  . Left knee DJD, degenerative meniscus tear 04/06/2012  . Numbness and tingling in hands 03/17/2012  . Periodontitis 09/23/2011  . Depression 09/23/2011  . Right knee meniscal tear 03/14/2011  . Lumbar radiculopathy 03/14/2011  . Stress incontinence of urine 10/18/2010  . NECK PAIN, CHRONIC 08/19/2008  . Normocytic anemia 05/12/2008  . SHOULDER PAIN, RIGHT 04/28/2008  . Hyperlipemia 06/18/2007  . Essential hypertension 05/20/2007    Past Surgical History:  Procedure Laterality Date  . APPENDECTOMY    . Bladder tack  10/2006   cystocoele  . BREAST SURGERY Right    benign cyst  . CARPAL TUNNEL RELEASE Left 10/25/2019   Procedure: LEFT CARPAL TUNNEL RELEASE;  Surgeon: Jessy Oto, MD;  Location: [facility];  Service: Orthopedics;  Laterality: Left;  . COLONOSCOPY    . Hysterectomy other    . IRRIGATION AND DEBRIDEMENT SHOULDER Right 02/21/2020   Procedure: IRRIGATION AND DEBRIDEMENT RIGHT SHOULDER;  Surgeon: Leandrew Koyanagi, MD;  Location: [facility];  Service: Orthopedics;   Laterality: Right;  . LUMBAR LAMINECTOMY N/A 03/16/2019   Procedure: CENTRAL LAMINECTOMIES L2-3, L3-4 AND L4-5;  Surgeon: Jessy Oto, MD;  Location: [facility];  Service: Orthopedics;  Laterality: N/A;  . TOTAL HIP ARTHROPLASTY Right 04/27/2018   Procedure: RIGHT TOTAL HIP ARTHROPLASTY ANTERIOR APPROACH;  Surgeon: Leandrew Koyanagi, MD;  Location: [facility];  Service: Orthopedics;  Laterality: Right;  . TUBAL LIGATION       OB History    Gravida  7   Para  4   Term  4   Preterm  0   AB  3   Living  4     SAB  3   IAB  0   Ectopic  0   Multiple  0   Live Births              Family History  Problem Relation Age of Onset  . Hypertension Mother     Social History   Tobacco Use  . Smoking status: Former Smoker    Packs/day: 0.10    Quit date: 11/26/1983    Years since quitting: 36.3  . Smokeless tobacco: Never Used  Vaping Use  . Vaping Use: Never used  Substance Use Topics  . Alcohol use: Yes    Alcohol/week: 0.0 standard drinks    Comment: rarely  . Drug use: Not Currently    Types: Marijuana    Comment: last used 2019    Home Medications Prior to Admission medications   Medication Sig Start Date End Date Taking? Authorizing Provider  albuterol (VENTOLIN HFA) 108 (90 Base) MCG/ACT inhaler Inhale 1-2 puffs into the lungs every 6 (six) hours as needed for wheezing or shortness of breath. 01/29/19   Katherine Roan, MD  amLODipine (NORVASC) 5 MG tablet TAKE 1 TABLET BY MOUTH DAILY 01/22/20   Lacinda Axon, MD  diclofenac (VOLTAREN) 75 MG EC tablet TAKE 1 TABLET BY MOUTH TWICE A DAY Patient not taking: Reported on 03/10/2020 12/10/19   Jessy Oto, MD  ferrous gluconate (FERGON) 324 MG tablet Take 1 tablet (324 mg total) by mouth daily with breakfast. 01/13/20   Seawell, Jaimie A, DO  fluticasone (FLONASE) 50 MCG/ACT nasal spray PLACE 1-2 SPRAYS INTO BOTH NOSTRILS DAILY. 01/31/20 01/30/21  [name], MD  gabapentin (NEURONTIN) 300 MG capsule Take  300 mg by mouth 3 (three) times daily. 09/21/19   [provider]  HYDROcodone-acetaminophen (NORCO) 5-325 MG tablet Take 1 tablet by mouth every 6 (six) hours as needed. 03/13/20   Leandrew Koyanagi, MD  lisinopril-hydrochlorothiazide (ZESTORETIC) 20-12.5 MG tablet TAKE 1 TABLET BY MOUTH DAILY. 03/06/20   Marianna Payment, MD  methocarbamol (ROBAXIN) 500 MG tablet Take 1 tablet (500 mg total) by mouth 3 (three) times daily as needed. Patient not taking: Reported on 03/10/2020 02/18/20   Aundra Dubin, PA-C  Multiple Vitamin (MULTIVITAMIN WITH MINERALS) TABS tablet Take 1 tablet by mouth daily.    [provider]  oxyCODONE-acetaminophen (PERCOCET) 7.5-325 MG tablet Take 1-2 tablets by mouth every 6 (six) hours as needed for severe pain. Take 1-2 every 6 hours prn pain Patient not taking: Reported on 03/10/2020 03/01/20   Aundra Dubin, PA-C  promethazine (PHENERGAN) 25 MG tablet  12/24/19   [provider]  QUEtiapine (SEROQUEL) 50 MG tablet Take 50 mg by mouth at bedtime.    [provider]  rosuvastatin (CRESTOR) 20 MG tablet Take 1 tablet (20 mg total) by mouth daily. 02/01/20 10/28/20  Maudie Mercury, MD  sertraline (ZOLOFT) 50 MG tablet Take 1 tablet (50 mg total) by mouth daily. 02/29/20   Marianna Payment, MD    Allergies    Tramadol  Review of Systems   Review of Systems  Constitutional: Negative for chills and fever.  HENT: Negative for ear pain and sore throat.   Eyes: Negative for pain and visual disturbance.  Respiratory: Negative for cough and shortness of breath.   Cardiovascular: Negative for chest pain and palpitations.  Gastrointestinal: Negative for abdominal pain and vomiting.  Genitourinary: Negative for dysuria and hematuria.  Musculoskeletal: Negative for arthralgias and back pain.  Skin: Negative for color change and rash.  Neurological: Negative for seizures and syncope.  All other systems reviewed and are negative.   Physical  Exam Updated Vital Signs BP (!) 159/104 (BP Location: Right Arm)   Pulse 94   Temp 99.5 F (37.5 C) (Oral)   Resp 16   SpO2 100%   Physical Exam Vitals and nursing note reviewed.  Constitutional:      General: She is not in acute distress.    Appearance: She is well-developed and well-nourished.  HENT:     Head: Normocephalic and atraumatic.  Eyes:     Conjunctiva/sclera: Conjunctivae normal.  Cardiovascular:     Rate and Rhythm: Normal rate and regular rhythm.     Heart sounds: No murmur heard.   Pulmonary:     Effort: Pulmonary effort is normal. No respiratory distress.     Breath sounds: Normal breath sounds.  Abdominal:     General: Abdomen is flat.     Palpations: Abdomen is soft.     Tenderness: There is no abdominal tenderness.  Musculoskeletal:        General: No edema. Normal range of motion.     Cervical back: Neck supple.     Comments: Left arm PICC line with evidence of significant removal, with approximately 1 to 2 cm remaining in the skin  Skin:    General: Skin is warm and dry.  Neurological:     General: No focal deficit present.     Mental Status: She is alert and oriented to person, place, and time.  Psychiatric:        Mood and Affect: Mood and affect normal.     ED Results / Procedures / Treatments   Labs (all labs ordered are listed, but only abnormal results are displayed) Labs Reviewed - No data to display  EKG None  Radiology [name] EKG SITE RITE  Result Date: 03/18/2020 If Site Rite image not attached, placement could not be confirmed due to current cardiac rhythm.   Procedures Procedures   Medications Ordered in ED Medications  sodium chloride flush (NS) 0.9 % injection 10-40 mL (has no administration in time range)  sodium chloride flush (NS) 0.9 % injection 10-40 mL (has no administration in time range)  Chlorhexidine Gluconate Cloth 2 % PADS 6 each (has no administration in time range)  HYDROcodone-acetaminophen (NORCO/VICODIN)  5-325 MG per tablet 1 tablet (1 tablet Oral Given 03/18/20 1705)    ED Course  I have reviewed the triage vital signs and the nursing notes.  Pertinent labs & imaging results that were available during my care of the patient were reviewed by me and considered in my medical decision making (see chart for details).    MDM Rules/Calculators/A&P                          70 year old female who presents to the ER after accidentally removing her PICC line.  When she arrived to the ER room, she was complaining of severe pain.  I ordered her home Norco.  IV team consult placed in triage, she was placed back in her room and had her PICC line put in with no complications.  Tolerating well.  Patient was instructed to follow-up with her orthopedic and ID team.  She voiced understanding is agreeable.  At this stage in the ED course, the patient is medically screened and stable for discharge. Final Clinical Impression(s) / ED Diagnoses Final diagnoses:  None    Rx / DC Orders ED Discharge Orders    None       Lyndel Safe 03/18/20 1853    Blanchie Dessert, MD 03/19/20 2054

## 2020-03-20 ENCOUNTER — Encounter: Payer: Self-pay | Admitting: Student

## 2020-03-20 ENCOUNTER — Other Ambulatory Visit: Payer: Self-pay

## 2020-03-20 ENCOUNTER — Ambulatory Visit (INDEPENDENT_AMBULATORY_CARE_PROVIDER_SITE_OTHER): Payer: Medicare HMO | Admitting: Student

## 2020-03-20 VITALS — BP 149/107 | HR 85 | Temp 98.3°F | Ht 65.0 in | Wt 181.9 lb

## 2020-03-20 DIAGNOSIS — I10 Essential (primary) hypertension: Secondary | ICD-10-CM

## 2020-03-20 DIAGNOSIS — Z1211 Encounter for screening for malignant neoplasm of colon: Secondary | ICD-10-CM

## 2020-03-20 DIAGNOSIS — F430 Acute stress reaction: Secondary | ICD-10-CM | POA: Diagnosis not present

## 2020-03-20 DIAGNOSIS — E782 Mixed hyperlipidemia: Secondary | ICD-10-CM | POA: Diagnosis not present

## 2020-03-20 DIAGNOSIS — F32A Depression, unspecified: Secondary | ICD-10-CM

## 2020-03-20 DIAGNOSIS — D509 Iron deficiency anemia, unspecified: Secondary | ICD-10-CM

## 2020-03-20 DIAGNOSIS — D508 Other iron deficiency anemias: Secondary | ICD-10-CM

## 2020-03-20 DIAGNOSIS — R3981 Functional urinary incontinence: Secondary | ICD-10-CM

## 2020-03-20 MED ORDER — ROSUVASTATIN CALCIUM 20 MG PO TABS
20.0000 mg | ORAL_TABLET | Freq: Every day | ORAL | 3 refills | Status: DC
Start: 2020-03-20 — End: 2021-02-23

## 2020-03-20 MED ORDER — FERROUS GLUCONATE 324 (38 FE) MG PO TABS: 324.0000 mg | ORAL_TABLET | Freq: Every day | ORAL | 3 refills | Status: DC

## 2020-03-20 MED ORDER — SERTRALINE HCL 50 MG PO TABS: 50.0000 mg | ORAL_TABLET | Freq: Every day | ORAL | 3 refills | Status: DC

## 2020-03-20 MED ORDER — PROMETHAZINE HCL 25 MG PO TABS: 25.0000 mg | ORAL_TABLET | Freq: Three times a day (TID) | ORAL | 0 refills | Status: DC | PRN

## 2020-03-20 NOTE — Assessment & Plan Note (Signed)
- 

## 2020-03-20 NOTE — Assessment & Plan Note (Signed)
Patient's blood pressure elevated to 149/107. Patient reports adherence to amlodipine 5mg  daily and lisinopril-HCTZ 20-12.5mg  without complaint. Due to her emotional distress and pain from her septic shoulder, will hold off on escalation of her regimen at this time. -Continue amlodipine 5mg  daily -Continue lisinopril-HCTZ 20-12.5mg  daily

## 2020-03-20 NOTE — Assessment & Plan Note (Signed)
Patient reports significant stress over past few months with recent operations, septic arthritis of her shoulder and life stressors. Patient very tearful throughout examination. She previously followed closely with [facility] as well as with our behavioral health specialist. Patient previously prescribed SSRI as well as quetiapine several years ago. Patient is interested in restarting sertraline and receiving referral to behavioral health -Restart sertraline 50mg  daily -Referral to integrated behavioral health, Dr. Theodis Shove

## 2020-03-20 NOTE — Assessment & Plan Note (Signed)
Patient reports adherence and appropriate tolerance of rosuvastatin 20mg  daily. -Refill rosuvastatin 20mg  daily

## 2020-03-20 NOTE — Assessment & Plan Note (Signed)
Patient presents for refill of her incontinence supplies. Patient has been using supplies for greater than one year at this time. She is limited in her ability to appropriately ambulate to the bathroom due to significant pain of her hips and knees. Her inability to ambulate to the bathroom is consistent with functional incontinence. She denies episodes of leaking of urine or stool. She does have a bedside commode which she recently received, however she is more confident and comfortable with using her supplies. -Refilled incontinence supplies -Encouraged to continue using bedside commode

## 2020-03-20 NOTE — Assessment & Plan Note (Signed)
Patient with iron deficiency anemia. Patient reports running out of her iron supplementation. -Refill ferrous gluconate 324mg  daily

## 2020-03-20 NOTE — Patient Instructions (Signed)
Olivia Ewing,  It was a pleasure meeting you today in clinic.  For your functional incontinence: I have signed the paperwork to have your incontinence supplies refilled. Also continue using the bedside commode as needed.  For your high cholesterol: I have refilled your crestor.  For your depression: I have refilled your sertraline (Zoloft) and placed a referral for our new integrated behavioral health specialist who will call you to schedule an appointment.  For your iron deficiency anemia: I have refilled your iron medication.  Lastly, I have placed a referral for you to undergo a repeat colonoscopy since it has been 10 years since your last test.  Sincerely, Dr. Paulla Dolly, MD

## 2020-03-20 NOTE — Progress Notes (Signed)
   CC: Need forms for incontinence supplies  HPI:  Olivia Ewing is a 70 y.o. with past medical history significant for HTN, HLD, MSSA septic arthritis of the right shoulder, functional incontinence who presents for completion of incontinence forms. Refer to problem list for charting of this encounter.  Past Medical History:  Diagnosis Date  . Anemia   . Arthritis    knees hands  . Bipolar disorder ([place])   . Cervicalgia   . Depression   . Environmental allergies    cause SOB, uses inhaler for  . Environmental and seasonal allergies    uses inhaler prn  . Full dentures   . GERD (gastroesophageal reflux disease)    diet controlled - no meds  . Hyperlipidemia    diet controlled, no meds  . Hypertension   . Lumbar radiculopathy, chronic   . Right knee pain    posterior horn medial meniscal tear MRI 2013  . Spinal stenosis    getting epidural injections -last one 06/12/2015  . SVD (spontaneous vaginal delivery)    x 4   Review of Systems: Endorses pain of right upper extremity. Denies abdominal pain, nausea, vomiting, diarrhea, pain with urination, urinary frequency/urgency.  Physical Exam:  Vitals:   03/20/20 1314  BP: (!) 149/107  Pulse: 85  Temp: 98.3 F (36.8 C)  TempSrc: Oral  SpO2: 100%  Weight: 181 lb 14.4 oz (82.5 kg)  Height: 5\' 5"  (1.651 m)   Physical Exam Vitals reviewed.  Cardiovascular:     Rate and Rhythm: Normal rate and regular rhythm.  Abdominal:     General: Abdomen is flat. Bowel sounds are normal.     Palpations: Abdomen is soft.     Tenderness: There is no abdominal tenderness.  Musculoskeletal:        General: Normal range of motion.  Psychiatric:        Attention and Perception: Attention and perception normal.        Mood and Affect: Mood is anxious. Affect is tearful.        Speech: Speech normal.        Behavior: Behavior normal. Behavior is cooperative.        Thought Content: Thought content is not paranoid or delusional.  Thought content does not include homicidal or suicidal ideation. Thought content does not include homicidal or suicidal plan.        Cognition and Memory: Cognition and memory normal.        Judgment: Judgment normal.    Assessment & Plan:   See Encounters Tab for problem based charting.  Patient seen with Dr. Jimmye Norman

## 2020-03-21 ENCOUNTER — Encounter: Payer: Self-pay | Admitting: Orthopaedic Surgery

## 2020-03-21 ENCOUNTER — Ambulatory Visit (INDEPENDENT_AMBULATORY_CARE_PROVIDER_SITE_OTHER): Payer: Medicare HMO | Admitting: Orthopaedic Surgery

## 2020-03-21 DIAGNOSIS — M009 Pyogenic arthritis, unspecified: Secondary | ICD-10-CM

## 2020-03-21 LAB — FUNGUS CULTURE WITH STAIN

## 2020-03-21 LAB — FUNGUS CULTURE RESULT

## 2020-03-21 LAB — FUNGAL ORGANISM REFLEX

## 2020-03-21 MED ORDER — OXYCODONE-ACETAMINOPHEN 7.5-325 MG PO TABS: 1.0000 | ORAL_TABLET | Freq: Four times a day (QID) | ORAL | 0 refills | Status: DC | PRN

## 2020-03-21 NOTE — Progress Notes (Signed)
Post-Op Visit Note   Patient: Olivia Ewing           Date of Birth: 01/14/1951           MRN: [id number] Visit Date: 03/21/2020 PCP: [name], MD   Assessment & Plan:  Chief Complaint:  Chief Complaint  Patient presents with  . Right Shoulder - Pain   Visit Diagnoses:  1. Septic arthritis of right acromioclavicular joint ([place])     Plan:   Olivia Ewing is 4 weeks as prescribed in the right shoulder abscess.  She remains on IV antibiotics with a PICC line.  This is managed by ID clinic.  She is getting home health PT.  She is having some pain and guarding and burning pain across the shoulder.  Denies any constitutional symptoms.  She states that she needs a refill on the Percocet.  Right shoulder shows a fully healed surgical scar.  No signs of infection.  She has moderate guarding to attempted range of motion.  No neurovascular compromise.  She feels a lot of burning in her arm.  I reviewed the MRI of her neck from last year and I think she could be having the symptoms due to this.  Also feel that she has chronic pain syndrome as well.  I think the best thing to do would be to refer her to a chronic pain clinic so that I can treat her with medication as well as cervical spine ESI's.  In the meantime she should continue with PT for shoulder mobilization rehab.  Recheck in 6 weeks.  Follow-Up Instructions: Return in about 6 weeks (around 05/02/2020).   Orders:  No orders of the defined types were placed in this encounter.  Meds ordered this encounter  Medications  . oxyCODONE-acetaminophen (PERCOCET) 7.5-325 MG tablet    Sig: Take 1 tablet by mouth every 6 (six) hours as needed for severe pain.    Dispense:  30 tablet    Refill:  0    Imaging: No results found.  PMFS History: Patient Active Problem List   Diagnosis Date Noted  . Septic arthritis ([place]) 02/25/2020  . Medication monitoring encounter 02/25/2020  . MSSA (methicillin susceptible Staphylococcus aureus)  infection 02/24/2020  . Septic arthritis of right acromioclavicular joint ([place])   . Abscess of bursa of right shoulder 02/10/2020  . S/P arthroscopy of right shoulder 01/05/2020  . Carpal tunnel syndrome, left upper limb 10/25/2019    Class: Chronic  . Carpal tunnel syndrome, right upper limb 10/25/2019    Class: Chronic  . Nontraumatic incomplete tear of right rotator cuff 07/27/2019  . Tendinopathy of right biceps tendon 07/27/2019  . Breast cancer screening by mammogram 06/07/2019  . Seasonal allergic rhinitis due to pollen 06/07/2019  . Colon cancer screening 06/07/2019  . Spinal stenosis, lumbar region, with neurogenic claudication 03/16/2019    Class: Chronic  . Status post lumbar laminectomy 03/16/2019  . Acute stress reaction 09/25/2018  . Primary osteoarthritis of right hip 04/27/2018  . Status post total hip replacement, 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  . Shortness of breath 01/02/2018  . Chalazion left upper eyelid 02/20/2017  . Vitamin D insufficiency 11/25/2013  . Healthcare maintenance 11/25/2013  . Spinal stenosis of lumbar region 07/02/2013  . Arthritis, degenerative 11/17/2012  . Arthrosis of right acromioclavicular joint 06/05/2012  . Rectocele 04/28/2012  . Left knee DJD, degenerative meniscus tear 04/06/2012  . Numbness and tingling in hands 03/17/2012  . Periodontitis 09/23/2011  . Depression 09/23/2011  . Right  knee meniscal tear 03/14/2011  . Lumbar radiculopathy 03/14/2011  . Functional incontinence 10/18/2010  . NECK PAIN, CHRONIC 08/19/2008  . Iron deficiency anemia 05/12/2008  . SHOULDER PAIN, RIGHT 04/28/2008  . Hyperlipemia 06/18/2007  . Essential hypertension 05/20/2007   Past Medical History:  Diagnosis Date  . Anemia   . Arthritis    knees hands  . Bipolar disorder ([place])   . Cervicalgia   . Depression   . Environmental allergies    cause SOB, uses inhaler for  . Environmental and seasonal allergies    uses inhaler prn  . Full dentures   . GERD  (gastroesophageal reflux disease)    diet controlled - no meds  . Hyperlipidemia    diet controlled, no meds  . Hypertension   . Lumbar radiculopathy, chronic   . Right knee pain    posterior horn medial meniscal tear MRI 2013  . Spinal stenosis    getting epidural injections -last one 06/12/2015  . SVD (spontaneous vaginal delivery)    x 4    Family History  Problem Relation Age of Onset  . Hypertension Mother     Past Surgical History:  Procedure Laterality Date  . APPENDECTOMY    . Bladder tack  10/2006   cystocoele  . BREAST SURGERY Right    benign cyst  . CARPAL TUNNEL RELEASE Left 10/25/2019   Procedure: LEFT CARPAL TUNNEL RELEASE;  Surgeon: Jessy Oto, MD;  Location: [facility];  Service: Orthopedics;  Laterality: Left;  . COLONOSCOPY    . Hysterectomy other    . IRRIGATION AND DEBRIDEMENT SHOULDER Right 02/21/2020   Procedure: IRRIGATION AND DEBRIDEMENT RIGHT SHOULDER;  Surgeon: Leandrew Koyanagi, MD;  Location: [facility];  Service: Orthopedics;  Laterality: Right;  . LUMBAR LAMINECTOMY N/A 03/16/2019   Procedure: CENTRAL LAMINECTOMIES L2-3, L3-4 AND L4-5;  Surgeon: Jessy Oto, MD;  Location: [facility];  Service: Orthopedics;  Laterality: N/A;  . TOTAL HIP ARTHROPLASTY Right 04/27/2018   Procedure: RIGHT TOTAL HIP ARTHROPLASTY ANTERIOR APPROACH;  Surgeon: Leandrew Koyanagi, MD;  Location: [facility];  Service: Orthopedics;  Laterality: Right;  . TUBAL LIGATION     Social History   Occupational History  . Not on file  Tobacco Use  . Smoking status: Former Smoker    Packs/day: 0.10    Quit date: 11/26/1983    Years since quitting: 36.3  . Smokeless tobacco: Never Used  Vaping Use  . Vaping Use: Never used  Substance and Sexual Activity  . Alcohol use: Yes    Alcohol/week: 0.0 standard drinks    Comment: rarely  . Drug use: Not Currently    Types: Marijuana    Comment: last used 2019  . Sexual activity: Not Currently    Birth  control/protection: Surgical

## 2020-03-22 ENCOUNTER — Encounter: Payer: Self-pay | Admitting: Student

## 2020-03-22 ENCOUNTER — Other Ambulatory Visit: Payer: Self-pay

## 2020-03-22 ENCOUNTER — Encounter: Payer: Self-pay | Admitting: Infectious Diseases

## 2020-03-22 ENCOUNTER — Ambulatory Visit (INDEPENDENT_AMBULATORY_CARE_PROVIDER_SITE_OTHER): Payer: Medicare HMO | Admitting: Infectious Diseases

## 2020-03-22 VITALS — BP 163/100 | HR 92 | Temp 99.3°F | Wt 181.6 lb

## 2020-03-22 DIAGNOSIS — M00011 Staphylococcal arthritis, right shoulder: Secondary | ICD-10-CM | POA: Diagnosis not present

## 2020-03-22 DIAGNOSIS — Z5181 Encounter for therapeutic drug level monitoring: Secondary | ICD-10-CM | POA: Diagnosis not present

## 2020-03-22 DIAGNOSIS — Z23 Encounter for immunization: Secondary | ICD-10-CM | POA: Diagnosis not present

## 2020-03-22 LAB — FUNGUS CULTURE WITH STAIN

## 2020-03-22 LAB — FUNGUS CULTURE RESULT

## 2020-03-22 LAB — FUNGAL ORGANISM REFLEX

## 2020-03-22 NOTE — Assessment & Plan Note (Signed)
Scheduled to get second dose of COVID vaccine

## 2020-03-22 NOTE — Progress Notes (Signed)
[facility]                                                             [place], [place], [place], [address]                                                                  Phn. [phone]; Fax: [phone]                                                                             Date: 03/22/20  Reason for Follow Up:  Assessment  70 Year old Female with a PMH ofTotal Rt hip replacement,rt biceps tendinosis, partial rotator cuff tear for which she underwent RT shoulder arthroscopy with distal clavicle excision, extensive debridement of supraspinatus, infraspinatus, Labrum, biceps, synovitis and subacromial bursitis and subacromial decompression with acromioplasty and ca ligament release on 08/25/19 and s/p I and D of Rt AC joint and rt shoulder deep abscess 02/21/20 with OR  cx growing MSSA who is here for:  RT shoulder septic arthritis - stable  Medication monitoring Labs 03/20/20 Cr 0.59, WBC 6.4, hb 8.1, , platelets 383, ESR 98, CRP 12  Health Maintenance - she has scheduled for second dose of COVID vaccine in 2/17  Plan PICC line placed on 03/01/20 with IV cefazolin started on same day Will request labs from [facility]  Fu in 2-3 weeks, plan to continue IV antibiotic until my next appointment tentatively. I will consider stopping antibiotics end of 4 weeks ( 2/16) if patient is doing clinically well and inflammatory markers are downtrending  All questions and concerns were discussed and addressed. Patient verbalized understanding of the plan. ____________________________________________________________________________________________________________________ Subjective  03/10/20 Olivia Ewing is here for follow up of septic arthritis of rt shoulder. She says pain has improved from 30/10 to 7/10. Denies any fever, chills and sweats. Denies any side effects like N/V/D/rashes. Has been injectiing UV  cefazolin three times a day without any issues. Denies any issues with PICC line. She will be following up with ortho. Overall feels well and no new complaints today.   03/22/20 Olivia Ewing is here for follow up of Rt shoulder septic arthritis. She is getting IV cefazolin from her PICC line which was recently exchanged in the ED after is pulled out. She is injecting IV antibiotics herself. No issues with the PICC line currently. Denies any sid effects with the antibiotics like N/V/D and rashes.  She followed up with Ortho and they removed her sutures. She says pain at the rt shoulder has improved from more than 10/10 ot 8-9/10, mobility has improved as well. She is working with PT. She has also been referred to pain clinic for pain management. She says she is also supposed to get MRI of  neck.   ROS: Constitutional: Negative for fever, chills, activity change, appetite change, fatigue and unexpected weight change.  HENT: Negative for congestion, sore throat, rhinorrhea, sneezing, trouble swallowing and sinus pressure.  Eyes: Negative for photophobia and visual disturbance.  Respiratory: Negative for cough, chest tightness, shortness of breath, wheezing and stridor.  Cardiovascular: Negative for chest pain, palpitations and leg swelling.  Gastrointestinal: Negative for nausea, vomiting, abdominal pain, diarrhea, constipation, blood in stool, abdominal distention and anal bleeding.  Genitourinary: Negative for dysuria, hematuria, flank pain and difficulty urinating.  Musculoskeletal: Negative for myalgias, back pain, and gait problem. RT SHOULDER PAIN AND SORENESS+ Skin: Negative for color change, pallor, rash and wound.  Neurological: Negative for dizziness, tremors, weakness and light-headedness.  Hematological: Negative for adenopathy. Does not bruise/bleed easily.  Psychiatric/Behavioral: Negative for behavioral problems, confusion, sleep disturbance, dysphoric mood, decreased concentration and agitation.    Past Medical History:  Diagnosis Date  . Anemia   . Arthritis    knees hands  . Bipolar disorder ([place])   . Cervicalgia   . Depression   . Environmental allergies    cause SOB, uses inhaler for  . Environmental and seasonal allergies    uses inhaler prn  . Full dentures   . GERD (gastroesophageal reflux disease)    diet controlled - no meds  . Hyperlipidemia    diet controlled, no meds  . Hypertension   . Lumbar radiculopathy, chronic   . Right knee pain    posterior horn medial meniscal tear MRI 2013  . Spinal stenosis    getting epidural injections -last one 06/12/2015  . SVD (spontaneous vaginal delivery)    x 4   Past Surgical History:  Procedure Laterality Date  . APPENDECTOMY    . Bladder tack  10/2006   cystocoele  . BREAST SURGERY Right    benign cyst  . CARPAL TUNNEL RELEASE Left 10/25/2019   Procedure: LEFT CARPAL TUNNEL RELEASE;  Surgeon: Jessy Oto, MD;  Location: [place];  Service: Orthopedics;  Laterality: Left;  . COLONOSCOPY    . Hysterectomy other    . IRRIGATION AND DEBRIDEMENT SHOULDER Right 02/21/2020   Procedure: IRRIGATION AND DEBRIDEMENT RIGHT SHOULDER;  Surgeon: Leandrew Koyanagi, MD;  Location: [place];  Service: Orthopedics;  Laterality: Right;  . LUMBAR LAMINECTOMY N/A 03/16/2019   Procedure: CENTRAL LAMINECTOMIES L2-3, L3-4 AND L4-5;  Surgeon: Jessy Oto, MD;  Location: [place];  Service: Orthopedics;  Laterality: N/A;  . TOTAL HIP ARTHROPLASTY Right 04/27/2018   Procedure: RIGHT TOTAL HIP ARTHROPLASTY ANTERIOR APPROACH;  Surgeon: Leandrew Koyanagi, MD;  Location: [place];  Service: Orthopedics;  Laterality: Right;  . TUBAL LIGATION     Current Outpatient Medications on File Prior to Visit  Medication Sig Dispense Refill  . albuterol (VENTOLIN HFA) 108 (90 Base) MCG/ACT inhaler Inhale 1-2 puffs into the lungs every 6 (six) hours as needed for wheezing or shortness of breath. 6.7 g 8  . amLODipine (NORVASC) 5 MG  tablet TAKE 1 TABLET BY MOUTH DAILY 90 tablet 1  . ferrous gluconate (FERGON) 324 MG tablet Take 1 tablet (324 mg total) by mouth daily with breakfast. 90 tablet 3  . fluticasone (FLONASE) 50 MCG/ACT nasal spray PLACE 1-2 SPRAYS INTO BOTH NOSTRILS DAILY. 16 mL 2  . gabapentin (NEURONTIN) 300 MG capsule Take 300 mg by mouth 3 (three) times daily.    Marland Kitchen lisinopril-hydrochlorothiazide (ZESTORETIC) 20-12.5 MG tablet TAKE 1 TABLET BY MOUTH DAILY. [place]  tablet 0  . Multiple Vitamin (MULTIVITAMIN WITH MINERALS) TABS tablet Take 1 tablet by mouth daily.    Marland Kitchen oxyCODONE-acetaminophen (PERCOCET) 7.5-325 MG tablet Take 1 tablet by mouth every 6 (six) hours as needed for severe pain. 30 tablet 0  . promethazine (PHENERGAN) 25 MG tablet Take 1 tablet (25 mg total) by mouth every 8 (eight) hours as needed for nausea or vomiting. 90 tablet 0  . rosuvastatin (CRESTOR) 20 MG tablet Take 1 tablet (20 mg total) by mouth daily. 90 tablet 3  . sertraline (ZOLOFT) 50 MG tablet Take 1 tablet (50 mg total) by mouth daily. 90 tablet 3   No current facility-administered medications on file prior to visit.   Allergies  Allergen Reactions  . Tramadol Other (See Comments)    Hallucinate, nightmare    Social History   Socioeconomic History  . Marital status: Divorced    Spouse name: Not on file  . Number of children: 4  . Years of education: Not on file  . Highest education level: Not on file  Occupational History  . Not on file  Tobacco Use  . Smoking status: Former Smoker    Packs/day: 0.10    Quit date: 11/26/1983    Years since quitting: 36.3  . Smokeless tobacco: Never Used  Vaping Use  . Vaping Use: Never used  Substance and Sexual Activity  . Alcohol use: Yes    Alcohol/week: 0.0 standard drinks    Comment: rarely  . Drug use: Not Currently    Types: Marijuana    Comment: last used 2019  . Sexual activity: Not Currently    Birth control/protection: Surgical  Other Topics Concern  . Not on file   Social History Narrative   Single. 9 siblings. 1 brother with HIV, 1 sister with hypertension. 4 children, 1 daughter with HTN and depression, 1 son with HTN.   [phone]- shelter's number.    Former smoker, no alcohol or drug use.   Social Determinants of Health   Financial Resource Strain: Not on file  Food Insecurity: Not on file  Transportation Needs: Not on file  Physical Activity: Not on file  Stress: Not on file  Social Connections: Not on file  Intimate Partner Violence: Not on file     Vitals BP (!) 163/100   Pulse 92   Temp 99.3 F (37.4 C) (Oral)   Wt 181 lb 9.6 oz (82.4 kg)   BMI 30.22 kg/m    Examination  General - not in acute distress, comfortably sitting in chair HEENT - PEERLA, no pallor and no icterus Chest - b/l clear air entry, no additional sounds CVS- Normal s1s2, RRR Abdomen - Soft, Non tender , non distended Ext- no pedal edema RT Shoulder: no signs of septic joint, hypersensitive, limited mobility of rt shoulder due to pain  Neuro: grossly normal Back - WNL Psych : calm and cooperative   Recent labs CBC Latest Ref Rng & Units 11/04/2019 10/21/2019 08/24/2019  WBC 3.4 - 10.8 x10E3/uL 5.0 5.5 7.7  Hemoglobin 11.1 - 15.9 g/dL 9.9(L) 9.4(L) 9.1(L)  Hematocrit 34.0 - 46.6 % 33.3(L) 32.3(L) 31.3(L)  Platelets 150 - 400 K/uL - 354 332   CMP Latest Ref Rng & Units 02/18/2020 10/21/2019 08/24/2019  Glucose 70 - 99 mg/dL 122(H) 94 110(H)  BUN 8 - 23 mg/dL <5(L) 5(L) 8  Creatinine 0.44 - 1.00 mg/dL 0.86 0.72 0.85  Sodium 135 - 145 mmol/L 137 139 140  Potassium 3.5 - 5.1 mmol/L 3.6  4.3 3.8  Chloride 98 - 111 mmol/L 102 104 105  CO2 22 - 32 mmol/L _0 Calcium 8.9 - 10.3 mg/dL 9.5 9.3 9.0  Total Protein 6.5 - 8.1 g/dL - 6.7 -  Total Bilirubin 0.3 - 1.2 mg/dL - 0.3 -  Alkaline Phos 38 - 126 U/L - 81 -  AST 15 - 41 U/L - 24 -  ALT 0 - 44 U/L - 20 -     Pertinent Microbiology Results for orders placed or performed during the hospital encounter of  02/21/20  Fungus Culture With Stain     Status: None   Collection Time: 02/21/20  1:13 PM   Specimen: PATH Other; Tissue  Result Value Ref Range Status   Fungus Stain Final report  Final   Fungus (Mycology) Culture Final report  Final    Comment: (NOTE) Performed At: [facility] [address] [place], [place] [id number] Rush Farmer MD [id number]    Fungal Source ABSCESS  Final    Comment: RIGHT SHOULDER SPEC A Performed at [facility], [place] [address], [place], [place] [address]   Aerobic/Anaerobic Culture (surgical/deep wound)     Status: None   Collection Time: 02/21/20  1:13 PM   Specimen: PATH Other; Tissue  Result Value Ref Range Status   Specimen Description ABSCESS  Final   Special Requests RIGHT SHOULDER SPEC A  Final   Gram Stain   Final    MODERATE WBC PRESENT, PREDOMINANTLY PMN FEW GRAM POSITIVE COCCI    Culture   Final    FEW STAPHYLOCOCCUS AUREUS NO ANAEROBES ISOLATED Performed at [facility], [place] [address] [place], [place] [address]    Report Status 02/26/2020 FINAL  Final   Organism ID, Bacteria STAPHYLOCOCCUS AUREUS  Final      Susceptibility   Staphylococcus aureus - MIC*    CIPROFLOXACIN <=0.5 SENSITIVE Sensitive     ERYTHROMYCIN >=8 RESISTANT Resistant     GENTAMICIN <=0.5 SENSITIVE Sensitive     OXACILLIN 0.5 SENSITIVE Sensitive     TETRACYCLINE <=1 SENSITIVE Sensitive     VANCOMYCIN 1 SENSITIVE Sensitive     TRIMETH/SULFA <=10 SENSITIVE Sensitive     CLINDAMYCIN <=0.25 SENSITIVE Sensitive     RIFAMPIN <=0.5 SENSITIVE Sensitive     Inducible Clindamycin NEGATIVE Sensitive     * FEW STAPHYLOCOCCUS AUREUS  Acid Fast Smear (AFB)     Status: None   Collection Time: 02/21/20  1:13 PM   Specimen: PATH Other; Tissue  Result Value Ref Range Status   AFB Specimen Processing Concentration  Final   Acid Fast Smear Negative  Final    Comment: (NOTE) Performed At: [facility] [place], [place]  [id number] Rush Farmer MD [id number]    Source (AFB) ABSCESS  Final    Comment: RIGHT SHOULDER SPEC A Performed at [facility], [place] [address], [place], [place] [address]   Fungus Culture Result     Status: None   Collection Time: 02/21/20  1:13 PM  Result Value Ref Range Status   Result 1 Comment  Final    Comment: (NOTE) KOH/Calcofluor preparation:  no fungus observed. Performed At: [facility] [place], [place] [id number] Rush Farmer MD YO:[id number]   Fungal organism reflex     Status: None   Collection Time: 02/21/20  1:13 PM  Result Value Ref Range Status   Fungal result 1 Comment  Final    Comment: (NOTE)  No yeast or mold isolated after 4 weeks. Performed At: [facility] [place], [place] [id number] Rush Farmer MD [id number]   Fungus Culture With Stain     Status: None   Collection Time: 02/21/20  1:26 PM   Specimen: PATH Other; Tissue  Result Value Ref Range Status   Fungus Stain Final report  Final   Fungus (Mycology) Culture Final report  Final    Comment: (NOTE) Performed At: [facility] [address] [place], [place] [id number] Rush Farmer MD VH:[id number]    Fungal Source TISSUE  Final    Comment: RIGHT SHOULDER SPEC B Performed at [facility], [place] [address] [place], [place] [address]   Aerobic/Anaerobic Culture (surgical/deep wound)     Status: None   Collection Time: 02/21/20  1:26 PM   Specimen: PATH Other; Tissue  Result Value Ref Range Status   Specimen Description TISSUE  Final   Special Requests RIGHT SHOULDER SPEC B  Final   Gram Stain NO WBC SEEN ABUNDANT GRAM POSITIVE COCCI   Final   Culture   Final    MODERATE STAPHYLOCOCCUS AUREUS NO ANAEROBES ISOLATED Performed at [facility], [place] [address], [place], [place] [address]    Report Status 02/26/2020 FINAL  Final   Organism ID, Bacteria STAPHYLOCOCCUS AUREUS  Final      Susceptibility    Staphylococcus aureus - MIC*    CIPROFLOXACIN <=0.5 SENSITIVE Sensitive     ERYTHROMYCIN >=8 RESISTANT Resistant     GENTAMICIN <=0.5 SENSITIVE Sensitive     OXACILLIN 0.5 SENSITIVE Sensitive     TETRACYCLINE <=1 SENSITIVE Sensitive     VANCOMYCIN 1 SENSITIVE Sensitive     TRIMETH/SULFA <=10 SENSITIVE Sensitive     CLINDAMYCIN <=0.25 SENSITIVE Sensitive     RIFAMPIN <=0.5 SENSITIVE Sensitive     Inducible Clindamycin NEGATIVE Sensitive     * MODERATE STAPHYLOCOCCUS AUREUS  Acid Fast Smear (AFB)     Status: None   Collection Time: 02/21/20  1:26 PM   Specimen: PATH Other; Tissue  Result Value Ref Range Status   AFB Specimen Processing Concentration  Final   Acid Fast Smear Negative  Final    Comment: (NOTE) Performed At: [facility] [place], [place] [id number] Rush Farmer MD [id number]    Source (AFB) TISSUE  Final    Comment: RIGHT SHOULDER SPEC B Performed at [facility], [place] [address] [place], [place] [address]   Fungus Culture Result     Status: None   Collection Time: 02/21/20  1:26 PM  Result Value Ref Range Status   Result 1 Comment  Final    Comment: (NOTE) KOH/Calcofluor preparation:  no fungus observed. Performed At: [facility] [place], [place] [id number] Rush Farmer MD SA:[id number]   Fungal organism reflex     Status: None   Collection Time: 02/21/20  1:26 PM  Result Value Ref Range Status   Fungal result 1 Comment  Final    Comment: (NOTE) No yeast or mold isolated after 4 weeks. Performed At: [facility] [place], [place] [id number] Rush Farmer MD UR:[id number]      Pertinent Imaging All pertinent labs/Imagings/notes reviewed. All pertinent plain films and CT images have been personally visualized and interpreted; radiology reports have been reviewed. Decision making incorporated into the Impression / Recommendations.  I spent greater than 25 minutes with the  patient including  review of prior medical records  with greater than 50% of time in face to face counsel of the patient.    Electronically signed by:  Rosiland Oz, MD Infectious Disease Physician [facility] [address] [place], [place] [address] Phone: [phone]  Fax: [phone]

## 2020-03-22 NOTE — Patient Instructions (Signed)
Fu in 3 weeks with me, continue IV antibiotics until then Fu with orthopedics and pain clinic as instructed

## 2020-03-22 NOTE — Assessment & Plan Note (Signed)
Labs reviewed.

## 2020-03-22 NOTE — Assessment & Plan Note (Signed)
Continue IV cefazolin as is until next clinic appt in early March Will consider discontinuing around end of 4 weeks ( 03/29/20)  if clinica;;y doing well and ESR/CRP downtrending

## 2020-03-23 NOTE — Telephone Encounter (Signed)
Completed and signed order and CMN forincontinence supplies placed in box for FO staff to fax to [facility]. Hubbard Hartshorn, BSN, RN-BC

## 2020-03-24 NOTE — Progress Notes (Signed)
Internal Medicine Clinic Attending  Case discussed with Dr. Johnson  At the time of the visit.  We reviewed the resident's history and exam and pertinent patient test results.  I agree with the assessment, diagnosis, and plan of care documented in the resident's note.  

## 2020-03-27 ENCOUNTER — Encounter: Payer: Self-pay | Admitting: Orthopaedic Surgery

## 2020-03-28 ENCOUNTER — Encounter: Payer: Self-pay | Admitting: Orthopaedic Surgery

## 2020-03-28 NOTE — Telephone Encounter (Signed)
Give her a call to clarify because I think something happened between [facility] and [facility].  Thanks.

## 2020-03-29 ENCOUNTER — Telehealth: Payer: Self-pay | Admitting: Orthopaedic Surgery

## 2020-03-29 ENCOUNTER — Other Ambulatory Visit: Payer: Self-pay | Admitting: Internal Medicine

## 2020-03-29 DIAGNOSIS — I10 Essential (primary) hypertension: Secondary | ICD-10-CM

## 2020-03-29 NOTE — Telephone Encounter (Signed)
Pt called stating she's not sure wether it was for PT or OT but it was discontinued due to her missing an appt or 2; and pt states she only missed those appts because she was overwhelmed from surgery. Pt would like another referral and would like to be updated once its submitted.   [phone]

## 2020-03-29 NOTE — Telephone Encounter (Signed)
Spoke to patient Olivia Ewing states she was non compliant. She would cx appts, shut the door on therapist face. No show appts, etc. Therapist would go out and patient would not be home. Olivia Ewing states we can send her to outpatient instead.   She states she has no transportation etc and would like HHPT. Would you like for me to send elsewhere (another agency) to see if they will approve her or just put outpatient PT ?

## 2020-03-29 NOTE — Telephone Encounter (Signed)
Yeah let's try another agency.

## 2020-03-29 NOTE — Telephone Encounter (Signed)
Emailed Olivia Ewing from [facility]

## 2020-03-30 ENCOUNTER — Ambulatory Visit: Payer: Medicare HMO

## 2020-03-30 ENCOUNTER — Other Ambulatory Visit: Payer: Self-pay | Admitting: Internal Medicine

## 2020-03-30 DIAGNOSIS — D508 Other iron deficiency anemias: Secondary | ICD-10-CM

## 2020-03-30 NOTE — Telephone Encounter (Signed)
FAXED INFO TO [facility])

## 2020-03-31 ENCOUNTER — Telehealth: Payer: Self-pay

## 2020-03-31 NOTE — Telephone Encounter (Signed)
Received call from [place] at [facility] wanting to clarify patient's medication orders. Per 03/22/20 note, patient is to continue Cefazolin, but there is mention of possibly discontinuing around 03/29/20 if patient has clinically improved. Will route to provider.   Beryle Flock, RN

## 2020-03-31 NOTE — Telephone Encounter (Signed)
PICC line can come out, no need for further IV antibiotics.

## 2020-03-31 NOTE — Telephone Encounter (Signed)
Send me her [facility] labs

## 2020-03-31 NOTE — Telephone Encounter (Signed)
Patient called she stated she needs to have a "consult" with Dr.Xu asap call [phone]

## 2020-03-31 NOTE — Telephone Encounter (Signed)
RN spoke with patient, she states the "excrutiting pain" has pretty much subsided. She states it is "no where near the pain I was having," but that she does sometimes have some aching in her shoulder when doing physical therapy or when it is cold outside.   Beryle Flock, RN

## 2020-03-31 NOTE — Telephone Encounter (Signed)
You can jsut reply to this note  with her WBC count, ESR and CRP

## 2020-03-31 NOTE — Telephone Encounter (Signed)
Spoke with Melissa at [facility] to relay verbal orders per Dr. West Bali that okay to pull PICC and discontinue IV antibiotics. Melissa verbalized understanding.   Beryle Flock, RN

## 2020-03-31 NOTE — Telephone Encounter (Signed)
If her shoulder pain has significantly improved over, Can DC PICC line and antibiotics.

## 2020-04-02 ENCOUNTER — Encounter: Payer: Self-pay | Admitting: Orthopaedic Surgery

## 2020-04-02 DIAGNOSIS — Z9889 Other specified postprocedural states: Secondary | ICD-10-CM

## 2020-04-02 DIAGNOSIS — M009 Pyogenic arthritis, unspecified: Secondary | ICD-10-CM

## 2020-04-02 NOTE — Telephone Encounter (Signed)
Left voice mail

## 2020-04-03 ENCOUNTER — Encounter: Payer: Self-pay | Admitting: *Deleted

## 2020-04-03 ENCOUNTER — Telehealth: Payer: Self-pay

## 2020-04-03 NOTE — Telephone Encounter (Signed)
Received voicemail in triage regarding patient due to have picc line removed today being treated for staph infection of right shoulder, but HH RN noticed 7 doses of ancef left. Routing to provider for advise.

## 2020-04-03 NOTE — Telephone Encounter (Signed)
Glad to hear that! 

## 2020-04-03 NOTE — Telephone Encounter (Signed)
Per Dr. West Bali extend therapy for 7 days and  have patient complete 7 doses of ancef.   Per [facility] this medication has expired and they will send out additional bags of ancef to complete 7 days course.   Called patient to make aware and patient stated [facility] already pulled picc line based off of last office visit note.   Routing to MD for advise if she want to send in oral antibiotics.  Eugenia Mcalpine

## 2020-04-03 NOTE — Telephone Encounter (Signed)
PT referral has been entered. My Chart message was sent to patient. I called and advised PT referral has been entered.  FYI She wanted for you to know that her PICC line has been removed and that she feels fantastic. She is so thankful for you and the entire team.

## 2020-04-03 NOTE — Telephone Encounter (Signed)
Yes please. Thanks. 

## 2020-04-03 NOTE — Telephone Encounter (Signed)
Per Dr. West Bali patient will not need any oral antibiotic therapy. Patient will follow up in the office on 3/4.  Olivia Ewing

## 2020-04-03 NOTE — Telephone Encounter (Signed)
noted 

## 2020-04-03 NOTE — Telephone Encounter (Signed)
Pt called to let us know she got her pick line removed.  And she would like to start PT again

## 2020-04-04 ENCOUNTER — Telehealth: Payer: Self-pay

## 2020-04-04 NOTE — Telephone Encounter (Signed)
Lauren from [facility] called states Referral was not accepted at [facility]) since they have no staff in the area.  [phone]  Do you want to try another agency?

## 2020-04-04 NOTE — Telephone Encounter (Signed)
Yes let's keep trying other ones.  [facility]?

## 2020-04-05 ENCOUNTER — Other Ambulatory Visit: Payer: Self-pay

## 2020-04-05 ENCOUNTER — Ambulatory Visit: Payer: Medicare HMO | Admitting: Behavioral Health

## 2020-04-05 DIAGNOSIS — F4323 Adjustment disorder with mixed anxiety and depressed mood: Secondary | ICD-10-CM

## 2020-04-05 LAB — ACID FAST CULTURE WITH REFLEXED SENSITIVITIES (MYCOBACTERIA): Acid Fast Culture: NEGATIVE

## 2020-04-05 NOTE — Telephone Encounter (Signed)
Faxed to [facility] [phone]

## 2020-04-06 NOTE — BH Specialist Note (Signed)
Integrated Behavioral Health Initial In-Person Visit  MRN: [id number] Name: Olivia Ewing  Number of [facility] Clinician visits:: 1/6 Session Start time: 11:00am  Session End time: 11:50am Total time: 50  minutes  Types of Service: Individual psychotherapy  Interpretor:No. Interpretor Name and Language: n/a   Warm Hand Off Completed.       Subjective: Olivia Ewing is a 70 y.o. female accompanied by self Patient was referred by Dr. Joni Reining, DO for Grief & Loss issues. 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 Pt has Son who has been incarcerated for 35yrs who is released in Oct. Pt is happy for him.  Duration of problem: almost 2 yrs of cumulative stress; Severity of problem: moderate  Objective: Mood: Depressed and Affect: Appropriate Risk of harm to self or others: No plan to harm self or others  Life Context: Family and Social: Pt has 4 children & 39 GChildren from the ages of 69yo-69yo. Her GChildren, "keep me sane!" Her 4 grown children are all doing well. Pt's younger Son is 55yo & recently accused of killing his GF. He is being held in downtown [employer]. 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 This exp has lft her feeling strange since she is used to caring for everyone else in the family & some friends. Life Changes: A difficult past 18 mos of health status changes & events in her family that have caused her much grief. Pt crying in session about her heart being broken.  Patient and/or Family's Strengths/Protective Factors: Social connections,  Social and Emotional competence, Concrete supports in place (healthy food, safe environments, etc.), Sense of purpose, Caregiver has knowledge of parenting & child development and Parental Resilience  Goals Addressed: Patient will: 1. Reduce symptoms of: depression and stress 2. Increase knowledge and/or ability of: coping skills, stress reduction and grief   3. Demonstrate ability to: Increase healthy adjustment to current life circumstances and Begin healthy grieving over loss  Progress towards Goals: Estb'd today  Interventions: Interventions utilized: Intake/Assessment  Standardized Assessments completed: Not Needed  Patient and/or Family Response: Pt receptive to care & support in visit today. She agrees to future sessions.  Patient Centered Plan: Patient is on the following Treatment Plan(s):  Record thoughts btwn sessions & prioritize for next session.  Assessment: Patient currently experiencing heightened grief & anxiety due to issues in the family. Pt is loving, but suffering from compassion fatigue.   Patient may benefit from grief over losses, processing of her grief, & validation/normalization of her Motherhood which is presently psychologically overwhelming.  Plan: 1. Follow up with behavioral health clinician on : 2-3 wks in person for f/u 2. Behavioral recommendations: Record your grieving, listen to music that is transcendent, & care for health needs of self. 3. Referral(s): [place] (In Clinic) 4. "From scale of 1-10, how likely are you to follow plan?": [place], LMFT

## 2020-04-07 LAB — ACID FAST CULTURE WITH REFLEXED SENSITIVITIES (MYCOBACTERIA): Acid Fast Culture: NEGATIVE

## 2020-04-10 ENCOUNTER — Other Ambulatory Visit: Payer: Self-pay | Admitting: Internal Medicine

## 2020-04-10 ENCOUNTER — Encounter: Payer: Self-pay | Admitting: Physician Assistant

## 2020-04-14 ENCOUNTER — Other Ambulatory Visit: Payer: Self-pay

## 2020-04-14 ENCOUNTER — Telehealth (INDEPENDENT_AMBULATORY_CARE_PROVIDER_SITE_OTHER): Payer: Medicare HMO | Admitting: Infectious Diseases

## 2020-04-14 DIAGNOSIS — Z5181 Encounter for therapeutic drug level monitoring: Secondary | ICD-10-CM | POA: Diagnosis not present

## 2020-04-14 DIAGNOSIS — M00011 Staphylococcal arthritis, right shoulder: Secondary | ICD-10-CM

## 2020-04-14 NOTE — Progress Notes (Signed)
Virtual Visit via Telephone Note  I connected with@ on 04/14/20 at  3:30 PM EST by a telephone enabled telemedicine application and verified that I am speaking with the correct person using two identifiers.  Location: Patient: Home  Provider: [facility]   I discussed the limitations of evaluation and management by telemedicine and the availability of in person appointments. The patient expressed understanding and agreed to proceed.  [facility]  Patient Active Problem List   Diagnosis Date Noted  . Immunization due 03/22/2020  . Septic arthritis ([place]) 02/25/2020  . Medication monitoring encounter 02/25/2020  . MSSA (methicillin susceptible Staphylococcus aureus) infection 02/24/2020  . Septic arthritis of right acromioclavicular joint ([place])   . Abscess of bursa of right shoulder 02/10/2020  . S/P arthroscopy of right shoulder 01/05/2020  . Carpal tunnel syndrome, left upper limb 10/25/2019    Class: Chronic  . Carpal tunnel syndrome, right upper limb 10/25/2019    Class: Chronic  . Nontraumatic incomplete tear of right rotator cuff 07/27/2019  . Tendinopathy of right biceps tendon 07/27/2019  . Breast cancer screening by mammogram 06/07/2019  . Seasonal allergic rhinitis due to pollen 06/07/2019  . Colon cancer screening 06/07/2019  . Spinal stenosis, lumbar region, with neurogenic claudication 03/16/2019    Class: Chronic  . Status post lumbar laminectomy 03/16/2019  . Acute stress reaction 09/25/2018  . Primary osteoarthritis of right hip 04/27/2018  . Status post total hip replacement, 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  . Shortness of breath 01/02/2018  . Chalazion left upper eyelid 02/20/2017  . Vitamin D insufficiency 11/25/2013  . Healthcare maintenance 11/25/2013  . Spinal stenosis of lumbar region 07/02/2013  . Arthritis, degenerative 11/17/2012  . Arthrosis of right acromioclavicular joint 06/05/2012  . Rectocele 04/28/2012  . Left knee DJD, degenerative  meniscus tear 04/06/2012  . Numbness and tingling in hands 03/17/2012  . Periodontitis 09/23/2011  . Depression 09/23/2011  . Right knee meniscal tear 03/14/2011  . Lumbar radiculopathy 03/14/2011  . Functional incontinence 10/18/2010  . NECK PAIN, CHRONIC 08/19/2008  . Iron deficiency anemia 05/12/2008  . SHOULDER PAIN, RIGHT 04/28/2008  . Hyperlipemia 06/18/2007  . Essential hypertension 05/20/2007    Patient's Medications  New Prescriptions   No medications on file  Previous Medications   ALBUTEROL (VENTOLIN HFA) 108 (90 BASE) MCG/ACT INHALER    Inhale 1-2 puffs into the lungs every 6 (six) hours as needed for wheezing or shortness of breath.   AMLODIPINE (NORVASC) 5 MG TABLET    TAKE 1 TABLET BY MOUTH DAILY   FERROUS GLUCONATE (FERGON) 324 MG TABLET    Take 1 tablet (324 mg total) by mouth daily with breakfast.   FLUTICASONE (FLONASE) 50 MCG/ACT NASAL SPRAY    PLACE 1-2 SPRAYS INTO BOTH NOSTRILS DAILY.   GABAPENTIN (NEURONTIN) 300 MG CAPSULE    Take 300 mg by mouth 3 (three) times daily.   LISINOPRIL-HYDROCHLOROTHIAZIDE (ZESTORETIC) 20-12.5 MG TABLET    TAKE 1 TABLET BY MOUTH DAILY   MULTIPLE VITAMIN (MULTIVITAMIN WITH MINERALS) TABS TABLET    Take 1 tablet by mouth daily.   OXYCODONE-ACETAMINOPHEN (PERCOCET) 7.5-325 MG TABLET    Take 1 tablet by mouth every 6 (six) hours as needed for severe pain.   PROMETHAZINE (PHENERGAN) 25 MG TABLET    Take 1 tablet (25 mg total) by mouth every 8 (eight) hours as needed for nausea or vomiting.   ROSUVASTATIN (CRESTOR) 20 MG TABLET    Take 1 tablet (20 mg total) by mouth daily.  SERTRALINE (ZOLOFT) 50 MG TABLET    Take 1 tablet (50 mg total) by mouth daily.  Modified Medications   No medications on file  Discontinued Medications   No medications on file    History of Present Illness: Here for phone visit for Rt shoulder septic arthritis. PICC line placed on 03/01/20 with IV cefazolin started on same day. PICC line and IV antibiotics was  discontinued on 2/18 after completion of 4 weeks of antibiotics. Patient says she is doing great. Denies any pain/tenderness and swelling in her rt shoulder. She is also working with her PT and able to move her RT shoulder without any difficulty. Denies any issues at the site of PICC line removal. She also has an upcoming appointment with Ortho. She is very much appreciative of the care she got for her treatment.    ROS: 12 point ROS done with pertinent positive and negatives listed above.   Past Medical History:  Diagnosis Date  . Anemia   . Arthritis    knees hands  . Bipolar disorder ([place])   . Cervicalgia   . Depression   . Environmental allergies    cause SOB, uses inhaler for  . Environmental and seasonal allergies    uses inhaler prn  . Full dentures   . GERD (gastroesophageal reflux disease)    diet controlled - no meds  . Hyperlipidemia    diet controlled, no meds  . Hypertension   . Lumbar radiculopathy, chronic   . Right knee pain    posterior horn medial meniscal tear MRI 2013  . Spinal stenosis    getting epidural injections -last one 06/12/2015  . SVD (spontaneous vaginal delivery)    x 4    Social History   Tobacco Use  . Smoking status: Former Smoker    Packs/day: 0.10    Quit date: 11/26/1983    Years since quitting: 36.4  . Smokeless tobacco: Never Used  Vaping Use  . Vaping Use: Never used  Substance Use Topics  . Alcohol use: Yes    Alcohol/week: 0.0 standard drinks    Comment: rarely  . Drug use: Not Currently    Types: Marijuana    Comment: last used 2019    Family History  Problem Relation Age of Onset  . Hypertension Mother     Allergies  Allergen Reactions  . Tramadol Other (See Comments)    Hallucinate, nightmare     Health Maintenance  Topic Date Due  . COVID-19 Vaccine (1) Never done  . MAMMOGRAM  01/10/2013  . TETANUS/TDAP  08/20/2018  . PNA vac Low Risk Adult (2 of 2 - PPSV23) 01/16/2019  . COLONOSCOPY (Pts 45-66yr  Insurance coverage will need to be confirmed)  03/02/2019  . INFLUENZA VACCINE  09/12/2019  . DEXA SCAN  Completed  . Hepatitis C Screening  Completed  . HPV VACCINES  Aged Out    Observations/Objective: Able to speak fluently   Assessment and Plan 70Year old Female with a PMH ofTotal Rt hip replacement,rt biceps tendinosis, partial rotator cuff tear for which she underwent RT shoulder arthroscopy with distal clavicle excision, extensive debridement of supraspinatus, infraspinatus, Labrum, biceps, synovitis and subacromial bursitis and subacromial decompression with acromioplasty and ca ligament release on 7/14/21and s/p I and D of Rt AC joint and rt shoulder deep abscess 02/21/20 with OR cx growing MSSA who is here for:  RT shoulder septic arthritis - stable  Medication monitoring Labs  03/13/20 WBC 7.7, hb 7.1, platelets  428Cr 0.63 CRP 36 03/20/20 WBC 6.4, hb 8.1, platelets 383 , ESR 98, CRP 12 Cr 0.59 03/31/20 WBC 6.4, hb 9.3, platelets 429 Cr 0.77, ESR 88, CRP 5  Plan Clinically doing well No need for further antibiotics  Follow Up Instructions As needed   I discussed the assessment and treatment plan with the patient. The patient was provided an opportunity to ask questions and all were answered. The patient agreed with the plan and demonstrated an understanding of the instructions.   The patient was advised to call back or seek an in-person evaluation if the symptoms worsen or if the condition fails to improve as anticipated.  I provided 20  minutes of non-face-to-face time during this encounter.  Wilber Oliphant, [facility] Phone [phone] Fax no. [phone]  04/14/2020, 3:17 PM

## 2020-04-14 NOTE — Progress Notes (Signed)
Nurse rooming not completed due to MD already completing  phone visit.   Beryle Flock, RN

## 2020-04-18 ENCOUNTER — Ambulatory Visit: Payer: Medicare HMO | Attending: Orthopaedic Surgery | Admitting: Physical Therapy

## 2020-04-18 ENCOUNTER — Other Ambulatory Visit: Payer: Self-pay

## 2020-04-18 DIAGNOSIS — M25511 Pain in right shoulder: Secondary | ICD-10-CM | POA: Insufficient documentation

## 2020-04-18 DIAGNOSIS — M6281 Muscle weakness (generalized): Secondary | ICD-10-CM | POA: Diagnosis present

## 2020-04-18 DIAGNOSIS — M25611 Stiffness of right shoulder, not elsewhere classified: Secondary | ICD-10-CM | POA: Insufficient documentation

## 2020-04-18 NOTE — Patient Instructions (Signed)
   Voncille Lo, PT, [place] Certified Exercise Expert for the Aging Adult  04/18/20 3:37 PM Phone: [phone] Fax: [phone]

## 2020-04-18 NOTE — Therapy (Signed)
[facility] [place], [place], [address] Phone: [phone]   Fax:  [phone]  Physical Therapy Evaluation  Patient Details  Name: Olivia Ewing MRN: [id number] Date of Birth: 1950/03/26 Referring Provider (PT): Frankey Shown MD   Encounter Date: 04/18/2020   PT End of Session - 04/18/20 1444    Visit Number 1    Number of Visits 16    Date for PT Re-Evaluation 06/13/20    Authorization Type [employer] MCR/MCD    PT Start Time 1500    PT Stop Time 0355    PT Time Calculation (min) 44 min    Activity Tolerance Patient tolerated treatment well;Patient limited by pain    Behavior During Therapy [facility] for tasks assessed/performed           Past Medical History:  Diagnosis Date  . Anemia   . Arthritis    knees hands  . Bipolar disorder ([place])   . Cervicalgia   . Depression   . Environmental allergies    cause SOB, uses inhaler for  . Environmental and seasonal allergies    uses inhaler prn  . Full dentures   . GERD (gastroesophageal reflux disease)    diet controlled - no meds  . Hyperlipidemia    diet controlled, no meds  . Hypertension   . Lumbar radiculopathy, chronic   . Right knee pain    posterior horn medial meniscal tear MRI 2013  . Spinal stenosis    getting epidural injections -last one 06/12/2015  . SVD (spontaneous vaginal delivery)    x 4    Past Surgical History:  Procedure Laterality Date  . APPENDECTOMY    . Bladder tack  10/2006   cystocoele  . BREAST SURGERY Right    benign cyst  . CARPAL TUNNEL RELEASE Left 10/25/2019   Procedure: LEFT CARPAL TUNNEL RELEASE;  Surgeon: Jessy Oto, MD;  Location: [facility];  Service: Orthopedics;  Laterality: Left;  . COLONOSCOPY    . Hysterectomy other    . IRRIGATION AND DEBRIDEMENT SHOULDER Right 02/21/2020   Procedure: IRRIGATION AND DEBRIDEMENT RIGHT SHOULDER;  Surgeon: Leandrew Koyanagi, MD;  Location: [facility];   Service: Orthopedics;  Laterality: Right;  . LUMBAR LAMINECTOMY N/A 03/16/2019   Procedure: CENTRAL LAMINECTOMIES L2-3, L3-4 AND L4-5;  Surgeon: Jessy Oto, MD;  Location: [facility];  Service: Orthopedics;  Laterality: N/A;  . TOTAL HIP ARTHROPLASTY Right 04/27/2018   Procedure: RIGHT TOTAL HIP ARTHROPLASTY ANTERIOR APPROACH;  Surgeon: Leandrew Koyanagi, MD;  Location: [facility];  Service: Orthopedics;  Laterality: Right;  . TUBAL LIGATION      There were no vitals filed for this visit.    Subjective Assessment - 04/18/20 1502    Subjective It all started when I had tiles fell from 3rd floor skylight and the tiles fell on me and I was crumpled up on the floor.  then I had arthroscopy over R shld with SAD, DCR on 08-25-2019 and then I had to have surgery  1/10-22 for R septic arthritis. I cant reach above my shoulder and I cannot sleep on my shoulder.  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  I know when my body feels infected.    Pertinent History Gout, Staph Aureus infection of shld. Cervicalgia.  Lumbar radiculopathy knee pain. R THA  2020, spinal stenosis ( wears back brace)    Limitations Lifting    How long can  you sit comfortably? unlimited    How long can you stand comfortably? no issues today    How long can you walk comfortably? before my shld I used to walk 2 x a day 1-2 miles    Diagnostic tests MRI shld    Currently in Pain? Yes    Pain Score 2     Pain Location Shoulder    Pain Orientation Right              [facility] PT Assessment - 04/18/20 0001      Assessment   Medical Diagnosis R shld srthroscopy post septic arthritis of AC jt    Referring Provider (PT) Frankey Shown MD    Onset Date/Surgical Date 08/25/19   1st RTC surgery 7-14,21, 2nd surgery 02/21/20   Hand Dominance Right    Prior Therapy after first shld surgery 7/21      Precautions   Precautions None      Restrictions   Weight Bearing Restrictions No      Balance Screen   Has the patient fallen in the past 6 months No     Has the patient had a decrease in activity level because of a fear of falling?  No    Is the patient reluctant to leave their home because of a fear of falling?  No      Home Ecologist residence    Living Arrangements Other relatives    Home Layout Two level      Observation/Other Assessments   Focus on Therapeutic Outcomes (FOTO)  FOTO shoulder intake score  47.6 %      ROM / Strength   AROM / PROM / Strength AROM;Strength      AROM   Right Shoulder Extension 20 Degrees    Right Shoulder Flexion 55 Degrees    Right Shoulder ABduction 40 Degrees    Right Shoulder Internal Rotation 45 Degrees    Right Shoulder External Rotation 45 Degrees    Left Shoulder Extension 30 Degrees    Left Shoulder Flexion 155 Degrees   PROM 160   Left Shoulder ABduction 150 Degrees   PROM 155   Left Shoulder Internal Rotation 60 Degrees    Left Shoulder External Rotation 90 Degrees      Strength   Overall Strength Deficits    Right Shoulder Flexion 3-/5    Right Shoulder Extension 3-/5    Right Shoulder ABduction 3-/5    Right Shoulder Internal Rotation 3-/5    Right Shoulder External Rotation 3-/5    Left Shoulder Flexion 5/5    Left Shoulder Extension 5/5    Left Shoulder ABduction 5/5    Left Shoulder Internal Rotation 5/5    Left Shoulder External Rotation 5/5      Palpation   Palpation comment tenderness over periscapular muscles especially anterior shld AC jt                      Objective measurements completed on examination: See above findings.               PT Education - 04/18/20 1730    Education Details POC Explanationof findings.  Intial HEP    Person(s) Educated Patient    Methods Explanation;Demonstration;Tactile cues;Verbal cues;Handout    Comprehension Verbalized understanding;Returned demonstration               PT Long Term Goals - 04/18/20 1537      PT  LONG TERM GOAL #1   Title Pt will be independent  with advanced HEP    Time 8    Period Weeks    Status New    Target Date 06/13/20      PT LONG TERM GOAL #2   Title Pt will improve shoulder strength to be able to lift at least 20 #s overhead to lift items in kitchen and household shelves    Baseline Unable    Time 8    Period Weeks    Status New    Target Date 06/13/20      PT LONG TERM GOAL #3   Title Pt will be able to carry at least 20  lbs without pain up and down steps    Baseline Unable    Time 8    Status New    Target Date 06/13/20      PT LONG TERM GOAL #4   Title Pt will be able to dress herself without pain and without compensation    Baseline Pt performs dressing with compensatory movements    Time 8    Period Weeks    Status New    Target Date 06/13/20      PT LONG TERM GOAL #5   Title Pt will improve FOTO intake  score to at least 55%    Baseline FOTO  47.6 %    Time 8    Period Weeks    Status New    Target Date 06/13/20                  Plan - 04/18/20 1715    Clinical Impression Statement Ms Zahn was a former pt in clinic after RTC SAD DCR ( surgery 08-25-19) but developed septic arthritis of acromioclavicular joint with recent  I and D surgery on 1/10-22. Pt did have HHPT but now presents to OPPT for management and PT for R shld.  Pt worked as a Quarry manager for 35 years and needs to be able to care for family members.  She is unable to raise her R shld or functionally use for I ADL and ADL because of pain and limitations  Ms Bebe Shaggy will benefit from skilled PT to address impairments and maximize functional use of R shld without apparent compensations shown in clinic today    Personal Factors and Comorbidities Comorbidity 2    Comorbidities Gout, Staph Aureus infection of shld. Cervicalgia.  Lumbar radiculopathy knee pain. R THA  2020, spinal stenosis ( wears back brace) HTN  Spinal stenosis    Examination-Activity Limitations Reach Overhead;Dressing;Hygiene/Grooming;Lift;Carry;Caring for Others     Examination-Participation Restrictions Volunteer;Laundry    Stability/Clinical Decision Making Evolving/Moderate complexity    Clinical Decision Making Moderate    Rehab Potential Good    PT Frequency 2x / week    PT Duration 8 weeks    PT Treatment/Interventions ADLs/Self Care Home Management;Cryotherapy;Electrical Stimulation;Joint Manipulations;Dry needling;Taping;Passive range of motion;Manual techniques;Neuromuscular re-education;Patient/family education;Therapeutic exercise;Therapeutic activities;Iontophoresis 4mg /ml Dexamethasone;Ultrasound;Moist Heat    PT Next Visit Plan review HEP manual  scar tissue massage    PT Home Exercise Plan [id number]    Consulted and Agree with Plan of Care Patient           Patient will benefit from skilled therapeutic intervention in order to improve the following deficits and impairments:  Pain,Postural dysfunction,Improper body mechanics,Impaired UE functional use,Increased fascial restricitons,Increased muscle spasms,Impaired flexibility,Decreased range of motion,Decreased strength  Visit Diagnosis: Right shoulder pain, unspecified chronicity  Stiffness of right  shoulder, not elsewhere classified  Muscle weakness (generalized)   Access Code: URL: [email or web address]: 03/08/2022Prepared by: Donnetta Simpers BeardsleyExercises  Supine Shoulder Flexion Extension AAROM with Dowel - 1 x daily - 7 x weekly - 3 sets - 10 reps  Supine Shoulder Horizontal Abduction Adduction AAROM with Dowel - 1 x daily - 7 x weekly - 3 sets - 10 reps  Supine Shoulder External Rotation with Dowel - 1 x daily - 7 x weekly - 3 sets - 10 reps  Supine Shoulder External Internal Rotation AAROM with Dowel - 1 x daily - 7 x weekly - 3 sets - 10 reps  Wall Clock - 1 x daily - 7 x weekly - 3 sets - 10 reps    Problem List Patient Active Problem List   Diagnosis Date Noted  . Immunization due 03/22/2020  . Septic arthritis ([place]) 02/25/2020  . Medication monitoring  encounter 02/25/2020  . MSSA (methicillin susceptible Staphylococcus aureus) infection 02/24/2020  . Septic arthritis of right acromioclavicular joint ([place])   . Abscess of bursa of right shoulder 02/10/2020  . S/P arthroscopy of right shoulder 01/05/2020  . Carpal tunnel syndrome, left upper limb 10/25/2019    Class: Chronic  . Carpal tunnel syndrome, right upper limb 10/25/2019    Class: Chronic  . Nontraumatic incomplete tear of right rotator cuff 07/27/2019  . Tendinopathy of right biceps tendon 07/27/2019  . Breast cancer screening by mammogram 06/07/2019  . Seasonal allergic rhinitis due to pollen 06/07/2019  . Colon cancer screening 06/07/2019  . Spinal stenosis, lumbar region, with neurogenic claudication 03/16/2019    Class: Chronic  . Status post lumbar laminectomy 03/16/2019  . Acute stress reaction 09/25/2018  . Primary osteoarthritis of right hip 04/27/2018  . Status post total hip replacement, 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  . Shortness of breath 01/02/2018  . Chalazion left upper eyelid 02/20/2017  . Vitamin D insufficiency 11/25/2013  . Healthcare maintenance 11/25/2013  . Spinal stenosis of lumbar region 07/02/2013  . Arthritis, degenerative 11/17/2012  . Arthrosis of right acromioclavicular joint 06/05/2012  . Rectocele 04/28/2012  . Left knee DJD, degenerative meniscus tear 04/06/2012  . Numbness and tingling in hands 03/17/2012  . Periodontitis 09/23/2011  . Depression 09/23/2011  . Right knee meniscal tear 03/14/2011  . Lumbar radiculopathy 03/14/2011  . Functional incontinence 10/18/2010  . NECK PAIN, CHRONIC 08/19/2008  . Iron deficiency anemia 05/12/2008  . SHOULDER PAIN, RIGHT 04/28/2008  . Hyperlipemia 06/18/2007  . Essential hypertension 05/20/2007   Voncille Lo, PT, [place] Certified Exercise Expert for the Aging Adult  04/18/20 5:34 PM Phone: [phone] Fax: [facility] [address] [place], [place], [address] Phone: [phone]   Fax:  [phone]  Name: Olivia Ewing MRN: [id number] Date of Birth: 1950/07/06   Referring diagnosis   Z98.890 (ICD-10-CM) - S/P arthroscopy of right shoulder    M00.9 (ICD-10-CM) septic arthritis of AC jt Treatment diagnosis? (if different than referring diagnosis) R shld pain, R shld stiffness What was this (referring dx) caused by? [x]  Surgery []  Fall []  Ongoing issue [x]  Arthritis []  Other: ____________  Laterality: [x]  Rt []  Lt []  Both  Check all possible CPT codes:      []  97110 (Therapeutic Exercise)  []  92507 (SLP Treatment)  []  [id number] (Neuro Re-ed)   []  92526 (Swallowing Treatment)   []  97116 (Gait Training)   []  [id number] (Cognitive Training, 1st 15 minutes) []  97140 (Manual Therapy)   []  97130 (Cognitive Training, each add'l  15 minutes)  []  97530 (Therapeutic Activities)  []  Other, List CPT Code ____________    []  37628 (Self Care)       [x]  All codes above (97110 - 97535)  []  97012 (Mechanical Traction)  [x]  97014 (E-stim Unattended)  [x]  97032 (E-stim manual)  [x]  97033 (Ionto)  [x]  97035 (Ultrasound)  []  97760 (Orthotic Fit) []  [id number] (Physical Performance Training) []  [id number] (Aquatic Therapy) []  [id number] (Contrast Bath) []  [id number] (Paraffin) []  97597 (Wound Care 1st 20 sq cm) []  97598 (Wound Care each add'l 20 sq cm) []  97016 (Vasopneumatic Device) []  [phone] Comptroller) []  [id number] (Prosthetic Training)

## 2020-04-20 ENCOUNTER — Other Ambulatory Visit: Payer: Self-pay | Admitting: Specialist

## 2020-04-20 ENCOUNTER — Other Ambulatory Visit: Payer: Self-pay

## 2020-04-20 ENCOUNTER — Telehealth: Payer: Self-pay | Admitting: Orthopaedic Surgery

## 2020-04-20 ENCOUNTER — Ambulatory Visit: Payer: Medicare HMO | Admitting: Behavioral Health

## 2020-04-20 DIAGNOSIS — F4323 Adjustment disorder with mixed anxiety and depressed mood: Secondary | ICD-10-CM

## 2020-04-20 MED ORDER — GABAPENTIN 300 MG PO CAPS: 300.0000 mg | ORAL_CAPSULE | Freq: Three times a day (TID) | ORAL | 1 refills | Status: DC

## 2020-04-20 NOTE — Telephone Encounter (Signed)
Pt called and states that you referred her to pain management in [place] but she doesn'tt have no way of getting there. She was wondering if you could change her referral to somewhere in [place] [phone].

## 2020-04-20 NOTE — BH Specialist Note (Signed)
Integrated Behavioral Health via Telemedicine Visit  04/20/2020 Olivia Ewing [id number]  Number of [place] visits: 2/6 Session Start time: 11:00am  Session End time: 11:50am Total time: 50   Referring Provider: Dr. [name], MD Patient/Family location: Pt at home in private [facility] Provider location: [facility] All persons participating in visit: Pt & Clinician Types of Service: Individual psychotherapy  I connected with [name] and/or Drucilla Schmidt Wagler's self via  Telephone or Video Enabled Telemedicine Application  (Video is Caregility application) and verified that I am speaking with the correct person using two identifiers. Discussed confidentiality: Yes   I discussed the limitations of telemedicine and the availability of in person appointments.  Discussed there is a possibility of technology failure and discussed alternative modes of communication if that failure occurs.  I discussed that engaging in this telemedicine visit, they consent to the provision of behavioral healthcare and the services will be billed under their insurance.  Patient and/or legal guardian expressed understanding and consented to Telemedicine visit: Yes   Presenting Concerns: Patient and/or family reports the following symptoms/concerns: Hx of past wknd that was filled w/tears & crying. Pt is handling post-surgical pain & rehab. Pt's Son has been in prison for 18 mos awaiting trial for possible murder charge. She spoke w/her Son last Friday & it promoted psychological overwhelm. Pt is worried for her Son.  Pt has regrets today about her Motherhood. Discussed regrets & how we learn from them daily. 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 Duration of problem: years; Severity of problem: moderate  Patient and/or Family's Strengths/Protective Factors: Social connections, Social and Emotional competence, Concrete supports in place (healthy food, safe  environments, etc.), Sense of purpose and Pt resilience is strong   Pt, "loves from the bottom of my soul."  Goals Addressed: Patient will: 1.  Reduce symptoms of: depression and stress  2.  Increase knowledge and/or ability of: coping skills and stress reduction  3.  Demonstrate ability to: Increase healthy adjustment to current life circumstances, Improve medication compliance and Begin healthy grieving over loss  Progress towards Goals: Ongoing  Interventions: Interventions utilized:  Medication Monitoring, Supportive Counseling and Grief & Loss Cslg Standardized Assessments completed: Not Needed  Patient and/or Family Response: Pt receptive to session today & wants to meet in person. Pt grateful for call today.  Assessment: Patient currently experiencing low mood due to recent events w/health & family.   Patient may benefit from initiating her Sertraline prescription; she started, took for 2 wks & stopped. Encouraged Pt in all ways to begin again bc the situation warrants it. Pt agreed.  Plan: 1. Follow up with behavioral health clinician on : f:f for one hour in 2-3 wks 2. Behavioral recommendations: Take your medication! Give yourself inc'd doses of self-care to reduce stress. Listen to your family-they love you. 3. Referral(s): [facility])  I discussed the assessment and treatment plan with the patient and/or parent/guardian. They were provided an opportunity to ask questions and all were answered. They agreed with the plan and demonstrated an understanding of the instructions.   They were advised to call back or seek an in-person evaluation if the symptoms worsen or if the condition fails to improve as anticipated.  Donnetta Hutching, LMFT

## 2020-04-20 NOTE — Telephone Encounter (Signed)
Pt is wondering if she can get a refill on her gabapentin because it helps with her aches and pains.

## 2020-04-20 NOTE — Addendum Note (Signed)
Addended by: Minda Ditto, Geoffery Spruce on: 04/20/2020 01:47 PM   Modules accepted: Orders

## 2020-04-24 ENCOUNTER — Ambulatory Visit: Payer: Medicare HMO | Admitting: Physical Therapy

## 2020-04-25 ENCOUNTER — Encounter: Payer: Self-pay | Admitting: Physical Therapy

## 2020-04-25 ENCOUNTER — Other Ambulatory Visit: Payer: Self-pay

## 2020-04-25 ENCOUNTER — Ambulatory Visit: Payer: Medicare HMO | Admitting: Physical Therapy

## 2020-04-25 DIAGNOSIS — M25611 Stiffness of right shoulder, not elsewhere classified: Secondary | ICD-10-CM

## 2020-04-25 DIAGNOSIS — M6281 Muscle weakness (generalized): Secondary | ICD-10-CM

## 2020-04-25 DIAGNOSIS — M25511 Pain in right shoulder: Secondary | ICD-10-CM | POA: Diagnosis not present

## 2020-04-25 NOTE — Therapy (Signed)
[place], [place], [address] Phone: [phone]   Fax:  [phone]  Physical Therapy Treatment  Patient Details  Name: Olivia Ewing MRN: [id number] Date of Birth: 09-Aug-1950 Referring Provider (PT): Frankey Shown MD   Encounter Date: 04/25/2020   PT End of Session - 04/25/20 1319    Visit Number 2    Number of Visits 16    Date for PT Re-Evaluation 06/13/20    Authorization Type [employer] MCR/MCD    PT Start Time 0115    PT Stop Time 0155    PT Time Calculation (min) 40 min           Past Medical History:  Diagnosis Date  . Anemia   . Arthritis    knees hands  . Bipolar disorder ([place])   . Cervicalgia   . Depression   . Environmental allergies    cause SOB, uses inhaler for  . Environmental and seasonal allergies    uses inhaler prn  . Full dentures   . GERD (gastroesophageal reflux disease)    diet controlled - no meds  . Hyperlipidemia    diet controlled, no meds  . Hypertension   . Lumbar radiculopathy, chronic   . Right knee pain    posterior horn medial meniscal tear MRI 2013  . Spinal stenosis    getting epidural injections -last one 06/12/2015  . SVD (spontaneous vaginal delivery)    x 4    Past Surgical History:  Procedure Laterality Date  . APPENDECTOMY    . Bladder tack  10/2006   cystocoele  . BREAST SURGERY Right    benign cyst  . CARPAL TUNNEL RELEASE Left 10/25/2019   Procedure: LEFT CARPAL TUNNEL RELEASE;  Surgeon: Jessy Oto, MD;  Location: [place];  Service: Orthopedics;  Laterality: Left;  . COLONOSCOPY    . Hysterectomy other    . IRRIGATION AND DEBRIDEMENT SHOULDER Right 02/21/2020   Procedure: IRRIGATION AND DEBRIDEMENT RIGHT SHOULDER;  Surgeon: Leandrew Koyanagi, MD;  Location: [place];  Service: Orthopedics;  Laterality: Right;  . LUMBAR LAMINECTOMY N/A 03/16/2019   Procedure: CENTRAL LAMINECTOMIES L2-3, L3-4 AND L4-5;   Surgeon: Jessy Oto, MD;  Location: [place];  Service: Orthopedics;  Laterality: N/A;  . TOTAL HIP ARTHROPLASTY Right 04/27/2018   Procedure: RIGHT TOTAL HIP ARTHROPLASTY ANTERIOR APPROACH;  Surgeon: Leandrew Koyanagi, MD;  Location: [place];  Service: Orthopedics;  Laterality: Right;  . TUBAL LIGATION      There were no vitals filed for this visit.   Subjective Assessment - 04/25/20 1319    Subjective I have been working on the exercises. I am in just a little pain.    Currently in Pain? No/denies              [facility] PT Assessment - 04/25/20 0001      ROM / Strength   AROM / PROM / Strength PROM      AROM   AROM Assessment Site Shoulder    Right/Left Shoulder Right    Right Shoulder Flexion 125 Degrees   standing 90   Right Shoulder Internal Rotation --   reach L5   Right Shoulder External Rotation --   reach base of occiput     PROM   PROM Assessment Site Shoulder    Right/Left Shoulder Right    Right Shoulder Flexion 130 Degrees    Right Shoulder External Rotation 60 Degrees  [facility] Adult PT Treatment/Exercise - 04/25/20 0001      Exercises   Exercises Shoulder      Shoulder Exercises: Supine   Protraction 15 reps    Flexion 10 reps;AROM    Other Supine Exercises flexion circles AROM CW/CCW    Other Supine Exercises supine cane exercises per HEP, chest press, pullovers, ER at neautral and at 45 degrees abduction      Shoulder Exercises: Standing   Row 15 reps    Theraband Level (Shoulder Row) Level 2 (Red)      Shoulder Exercises: Pulleys   Flexion 2 minutes      Shoulder Exercises: ROM/Strengthening   UBE (Upper Arm Bike) no resistance, AAROM x 1 minute each way    Other ROM/Strengthening Exercises wallder ladder flexion x 5      Manual Therapy   Manual therapy comments PROM all planes                       PT Long Term Goals - 04/18/20 1537      PT LONG TERM GOAL #1   Title Pt will be independent with  advanced HEP    Time 8    Period Weeks    Status New    Target Date 06/13/20      PT LONG TERM GOAL #2   Title Pt will improve shoulder strength to be able to lift at least 20 #s overhead to lift items in kitchen and household shelves    Baseline Unable    Time 8    Period Weeks    Status New    Target Date 06/13/20      PT LONG TERM GOAL #3   Title Pt will be able to carry at least 20  lbs without pain up and down steps    Baseline Unable    Time 8    Status New    Target Date 06/13/20      PT LONG TERM GOAL #4   Title Pt will be able to dress herself without pain and without compensation    Baseline Pt performs dressing with compensatory movements    Time 8    Period Weeks    Status New    Target Date 06/13/20      PT LONG TERM GOAL #5   Title Pt will improve FOTO intake  score to at least 55%    Baseline FOTO  47.6 %    Time 8    Period Weeks    Status New    Target Date 06/13/20                 Plan - 04/25/20 1345    Clinical Impression Statement Pt arrives reporting compliance with some of her HEP. Cues required for all HEP exercises during review. Continued with AAROM and AROM. Began resisted scap rows and UBE with good tolerance. She has pain with end range PROM.  Pt declined ice at end of session.  She was encouraged to ice at home, she is back for another appointment tomorrow.    PT Next Visit Plan review HEP manual  scar tissue massage, progress HEP    PT Home Exercise Plan [id number]           Patient will benefit from skilled therapeutic intervention in order to improve the following deficits and impairments:  Pain,Postural dysfunction,Improper body mechanics,Impaired UE functional use,Increased fascial restricitons,Increased muscle spasms,Impaired flexibility,Decreased range of motion,Decreased strength  Visit Diagnosis: Right shoulder pain, unspecified chronicity  Stiffness of right shoulder, not elsewhere classified  Muscle weakness  (generalized)     Problem List Patient Active Problem List   Diagnosis Date Noted  . Immunization due 03/22/2020  . Septic arthritis ([place]) 02/25/2020  . Medication monitoring encounter 02/25/2020  . MSSA (methicillin susceptible Staphylococcus aureus) infection 02/24/2020  . Septic arthritis of right acromioclavicular joint ([place])   . Abscess of bursa of right shoulder 02/10/2020  . S/P arthroscopy of right shoulder 01/05/2020  . Carpal tunnel syndrome, left upper limb 10/25/2019    Class: Chronic  . Carpal tunnel syndrome, right upper limb 10/25/2019    Class: Chronic  . Nontraumatic incomplete tear of right rotator cuff 07/27/2019  . Tendinopathy of right biceps tendon 07/27/2019  . Breast cancer screening by mammogram 06/07/2019  . Seasonal allergic rhinitis due to pollen 06/07/2019  . Colon cancer screening 06/07/2019  . Spinal stenosis, lumbar region, with neurogenic claudication 03/16/2019    Class: Chronic  . Status post lumbar laminectomy 03/16/2019  . Acute stress reaction 09/25/2018  . Primary osteoarthritis of right hip 04/27/2018  . Status post total hip replacement, 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  . Shortness of breath 01/02/2018  . Chalazion left upper eyelid 02/20/2017  . Vitamin D insufficiency 11/25/2013  . Healthcare maintenance 11/25/2013  . Spinal stenosis of lumbar region 07/02/2013  . Arthritis, degenerative 11/17/2012  . Arthrosis of right acromioclavicular joint 06/05/2012  . Rectocele 04/28/2012  . Left knee DJD, degenerative meniscus tear 04/06/2012  . Numbness and tingling in hands 03/17/2012  . Periodontitis 09/23/2011  . Depression 09/23/2011  . Right knee meniscal tear 03/14/2011  . Lumbar radiculopathy 03/14/2011  . Functional incontinence 10/18/2010  . NECK PAIN, CHRONIC 08/19/2008  . Iron deficiency anemia 05/12/2008  . SHOULDER PAIN, RIGHT 04/28/2008  . Hyperlipemia 06/18/2007  . Essential hypertension 05/20/2007    Dorene Ar,  PTA 04/25/2020, 1:59 PM  [facility] [address] [place], [place], [address] Phone: [phone]   Fax:  [phone]  Name: PRINCES FINGER MRN: [id number] Date of Birth: Jan 13, 1951

## 2020-04-26 ENCOUNTER — Encounter: Payer: Self-pay | Admitting: Physical Therapy

## 2020-04-26 ENCOUNTER — Ambulatory Visit: Payer: Medicare HMO | Admitting: Physical Therapy

## 2020-04-26 DIAGNOSIS — M6281 Muscle weakness (generalized): Secondary | ICD-10-CM

## 2020-04-26 DIAGNOSIS — M25511 Pain in right shoulder: Secondary | ICD-10-CM

## 2020-04-26 DIAGNOSIS — M25611 Stiffness of right shoulder, not elsewhere classified: Secondary | ICD-10-CM

## 2020-04-26 NOTE — Telephone Encounter (Signed)
Changed order to pain med in [employer] [facility]

## 2020-04-26 NOTE — Therapy (Signed)
[place], [place], [address] Phone: [phone]   Fax:  [phone]  Physical Therapy Treatment  Patient Details  Name: Olivia Ewing MRN: [id number] Date of Birth: [birth year] Referring Provider (PT): Frankey Shown MD   Encounter Date: 04/26/2020   PT End of Session - 04/26/20 1555    Visit Number 3    Number of Visits 16    Date for PT Re-Evaluation 06/13/20    Authorization Type [employer] MCR/MCD    PT Start Time 1660    PT Stop Time 1458    PT Time Calculation (min) 43 min    Activity Tolerance Patient tolerated treatment well;Patient limited by pain    Behavior During Therapy [facility] for tasks assessed/performed           Past Medical History:  Diagnosis Date  . Anemia   . Arthritis    knees hands  . Bipolar disorder ([place])   . Cervicalgia   . Depression   . Environmental allergies    cause SOB, uses inhaler for  . Environmental and seasonal allergies    uses inhaler prn  . Full dentures   . GERD (gastroesophageal reflux disease)    diet controlled - no meds  . Hyperlipidemia    diet controlled, no meds  . Hypertension   . Lumbar radiculopathy, chronic   . Right knee pain    posterior horn medial meniscal tear MRI 2013  . Spinal stenosis    getting epidural injections -last one 06/12/2015  . SVD (spontaneous vaginal delivery)    x 4    Past Surgical History:  Procedure Laterality Date  . APPENDECTOMY    . Bladder tack  10/2006   cystocoele  . BREAST SURGERY Right    benign cyst  . CARPAL TUNNEL RELEASE Left 10/25/2019   Procedure: LEFT CARPAL TUNNEL RELEASE;  Surgeon: Jessy Oto, MD;  Location: [place];  Service: Orthopedics;  Laterality: Left;  . COLONOSCOPY    . Hysterectomy other    . IRRIGATION AND DEBRIDEMENT SHOULDER Right 02/21/2020   Procedure: IRRIGATION AND DEBRIDEMENT RIGHT SHOULDER;  Surgeon: Leandrew Koyanagi, MD;  Location: [place];   Service: Orthopedics;  Laterality: Right;  . LUMBAR LAMINECTOMY N/A 03/16/2019   Procedure: CENTRAL LAMINECTOMIES L2-3, L3-4 AND L4-5;  Surgeon: Jessy Oto, MD;  Location: [facility];  Service: Orthopedics;  Laterality: N/A;  . TOTAL HIP ARTHROPLASTY Right 04/27/2018   Procedure: RIGHT TOTAL HIP ARTHROPLASTY ANTERIOR APPROACH;  Surgeon: Leandrew Koyanagi, MD;  Location: [place];  Service: Orthopedics;  Laterality: Right;  . TUBAL LIGATION      There were no vitals filed for this visit.   Subjective Assessment - 04/26/20 1421    Subjective Patient reports her shoulder has been feeling pretty good the ast few days. She was a little sore from yesterday but not too bad.    Pertinent History Gout, Staph Aureus infection of shld. Cervicalgia.  Lumbar radiculopathy knee pain. R THA  2020, spinal stenosis ( wears back brace)    Limitations Lifting    How long can you sit comfortably? unlimited    How long can you stand comfortably? no issues today    How long can you walk comfortably? before my shld I used to walk 2 x a day 1-2 miles    Diagnostic tests MRI shld    Currently in Pain? Yes    Pain Score 3  Pain Location Shoulder    Pain Orientation Right    Pain Descriptors / Indicators Aching    Pain Type Chronic pain    Pain Onset More than a month ago    Pain Frequency Constant    Aggravating Factors  use of the shoulder    Pain Relieving Factors rest                             [facility] Adult PT Treatment/Exercise - 04/26/20 0001      Shoulder Exercises: Supine   Protraction 15 reps    Flexion 10 reps;AROM    Other Supine Exercises supine wand ER 2x10    Other Supine Exercises supine cane exercises per HEP, chest press, pullovers, ER at neautral and at 45 degrees abduction      Shoulder Exercises: Standing   Extension 20 reps    Theraband Level (Shoulder Extension) Level 2 (Red)    Row 15 reps    Theraband Level (Shoulder Row) Level 2 (Red)    Other Standing Exercises  finger ladder x5      Shoulder Exercises: Pulleys   Flexion 2 minutes      Manual Therapy   Manual Therapy Joint mobilization;Soft tissue mobilization    Manual therapy comments [id number]    Joint Mobilization grade II and III inferior and posterior glides    Soft tissue mobilization trigger point release to upper trap                  PT Education - 04/26/20 1552    Education Details reviewed HEp adn symptom mangement    Person(s) Educated Patient    Methods Explanation;Demonstration;Tactile cues;Verbal cues    Comprehension Verbalized understanding;Returned demonstration;Verbal cues required;Tactile cues required               PT Long Term Goals - 04/18/20 1537      PT LONG TERM GOAL #1   Title Pt will be independent with advanced HEP    Time 8    Period Weeks    Status New    Target Date 06/13/20      PT LONG TERM GOAL #2   Title Pt will improve shoulder strength to be able to lift at least 20 #s overhead to lift items in kitchen and household shelves    Baseline Unable    Time 8    Period Weeks    Status New    Target Date 06/13/20      PT LONG TERM GOAL #3   Title Pt will be able to carry at least 20  lbs without pain up and down steps    Baseline Unable    Time 8    Status New    Target Date 06/13/20      PT LONG TERM GOAL #4   Title Pt will be able to dress herself without pain and without compensation    Baseline Pt performs dressing with compensatory movements    Time 8    Period Weeks    Status New    Target Date 06/13/20      PT LONG TERM GOAL #5   Title Pt will improve FOTO intake  score to at least 55%    Baseline FOTO  47.6 %    Time 8    Period Weeks    Status New    Target Date 06/13/20  Plan - 04/26/20 1557    Clinical Impression Statement Patient tolerated treatment well. She had an appointment yesterday but did't have much pain following. she continues to have pain with  end ranges. Therapy educated the patient on the improtance of movement in the ranges ashe has but not forcing anthything. We reviewed her HEP. We will continue to progress her range aand strength as tolerated.    Personal Factors and Comorbidities Comorbidity 2    Comorbidities Gout, Staph Aureus infection of shld. Cervicalgia.  Lumbar radiculopathy knee pain. R THA  2020, spinal stenosis ( wears back brace) HTN  Spinal stenosis    Examination-Activity Limitations Reach Overhead;Dressing;Hygiene/Grooming;Lift;Carry;Caring for Others    Examination-Participation Restrictions Volunteer;Laundry    Stability/Clinical Decision Making Evolving/Moderate complexity    Clinical Decision Making Moderate    Rehab Potential Good    PT Frequency 2x / week    PT Duration 8 weeks    PT Treatment/Interventions ADLs/Self Care Home Management;Cryotherapy;Electrical Stimulation;Joint Manipulations;Dry needling;Taping;Passive range of motion;Manual techniques;Neuromuscular re-education;Patient/family education;Therapeutic exercise;Therapeutic activities;Iontophoresis 4mg /ml Dexamethasone;Ultrasound;Moist Heat    PT Next Visit Plan review HEP manual  scar tissue massage, progress HEP    PT Home Exercise Plan [id number]    Consulted and Agree with Plan of Care Patient           Patient will benefit from skilled therapeutic intervention in order to improve the following deficits and impairments:  Pain,Postural dysfunction,Improper body mechanics,Impaired UE functional use,Increased fascial restricitons,Increased muscle spasms,Impaired flexibility,Decreased range of motion,Decreased strength  Visit Diagnosis: Right shoulder pain, unspecified chronicity  Stiffness of right shoulder, not elsewhere classified  Muscle weakness (generalized)     Problem List Patient Active Problem List   Diagnosis Date Noted  . Immunization due 03/22/2020  . Septic arthritis ([place]) 02/25/2020  . Medication monitoring encounter  02/25/2020  . MSSA (methicillin susceptible Staphylococcus aureus) infection 02/24/2020  . Septic arthritis of right acromioclavicular joint ([place])   . Abscess of bursa of right shoulder 02/10/2020  . S/P arthroscopy of right shoulder 01/05/2020  . Carpal tunnel syndrome, left upper limb 10/25/2019    Class: Chronic  . Carpal tunnel syndrome, right upper limb 10/25/2019    Class: Chronic  . Nontraumatic incomplete tear of right rotator cuff 07/27/2019  . Tendinopathy of right biceps tendon 07/27/2019  . Breast cancer screening by mammogram 06/07/2019  . Seasonal allergic rhinitis due to pollen 06/07/2019  . Colon cancer screening 06/07/2019  . Spinal stenosis, lumbar region, with neurogenic claudication 03/16/2019    Class: Chronic  . Status post lumbar laminectomy 03/16/2019  . Acute stress reaction 09/25/2018  . Primary osteoarthritis of right hip 04/27/2018  . Status post total hip replacement, 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  . Shortness of breath 01/02/2018  . Chalazion left upper eyelid 02/20/2017  . Vitamin D insufficiency 11/25/2013  . Healthcare maintenance 11/25/2013  . Spinal stenosis of lumbar region 07/02/2013  . Arthritis, degenerative 11/17/2012  . Arthrosis of right acromioclavicular joint 06/05/2012  . Rectocele 04/28/2012  . Left knee DJD, degenerative meniscus tear 04/06/2012  . Numbness and tingling in hands 03/17/2012  . Periodontitis 09/23/2011  . Depression 09/23/2011  . Right knee meniscal tear 03/14/2011  . Lumbar radiculopathy 03/14/2011  . Functional incontinence 10/18/2010  . NECK PAIN, CHRONIC 08/19/2008  . Iron deficiency anemia 05/12/2008  . SHOULDER PAIN, RIGHT 04/28/2008  . Hyperlipemia 06/18/2007  . Essential hypertension 05/20/2007    Carney Living PT DPT  04/26/2020, 4:05 PM  [place]  [place], [place], [address] Phone: [phone]   Fax:  [phone]  Name: Olivia Ewing MRN:  [id number] Date of Birth: 02-Nov-1950

## 2020-04-27 ENCOUNTER — Ambulatory Visit: Payer: Medicare HMO | Admitting: Physician Assistant

## 2020-05-01 ENCOUNTER — Ambulatory Visit: Payer: Medicare HMO | Admitting: Physical Therapy

## 2020-05-01 NOTE — Addendum Note (Signed)
Addended by: Hulan Fray on: 05/01/2020 05:43 PM   Modules accepted: Orders

## 2020-05-02 ENCOUNTER — Other Ambulatory Visit: Payer: Self-pay | Admitting: Student

## 2020-05-02 ENCOUNTER — Encounter: Payer: Medicare HMO | Admitting: Internal Medicine

## 2020-05-02 DIAGNOSIS — J301 Allergic rhinitis due to pollen: Secondary | ICD-10-CM

## 2020-05-03 ENCOUNTER — Ambulatory Visit: Payer: Medicare HMO | Admitting: Physical Therapy

## 2020-05-03 ENCOUNTER — Other Ambulatory Visit: Payer: Self-pay

## 2020-05-03 ENCOUNTER — Encounter: Payer: Self-pay | Admitting: Physical Therapy

## 2020-05-03 DIAGNOSIS — M25511 Pain in right shoulder: Secondary | ICD-10-CM | POA: Diagnosis not present

## 2020-05-03 DIAGNOSIS — M6281 Muscle weakness (generalized): Secondary | ICD-10-CM

## 2020-05-03 DIAGNOSIS — M25611 Stiffness of right shoulder, not elsewhere classified: Secondary | ICD-10-CM

## 2020-05-04 NOTE — Therapy (Signed)
[place], [place], [address] Phone: [phone]   Fax:  [phone]  Physical Therapy Treatment  Patient Details  Name: Olivia Ewing MRN: [id number] Date of Birth: October 25, 1950 Referring Provider (PT): Frankey Shown MD   Encounter Date: 05/03/2020   PT End of Session - 05/04/20 1447    Visit Number 4    Number of Visits 16    Date for PT Re-Evaluation 06/13/20    Authorization Type [employer] MCR/MCD    PT Start Time 0630    PT Stop Time 1445   Patient declined further treatment 2nd to patient reporting soreness in her back, hip , and shoulder   PT Time Calculation (min) 30 min    Activity Tolerance Patient tolerated treatment well;Patient limited by pain    Behavior During Therapy [facility] for tasks assessed/performed           Past Medical History:  Diagnosis Date  . Anemia   . Arthritis    knees hands  . Bipolar disorder ([place])   . Cervicalgia   . Depression   . Environmental allergies    cause SOB, uses inhaler for  . Environmental and seasonal allergies    uses inhaler prn  . Full dentures   . GERD (gastroesophageal reflux disease)    diet controlled - no meds  . Hyperlipidemia    diet controlled, no meds  . Hypertension   . Lumbar radiculopathy, chronic   . Right knee pain    posterior horn medial meniscal tear MRI 2013  . Spinal stenosis    getting epidural injections -last one 06/12/2015  . SVD (spontaneous vaginal delivery)    x 4    Past Surgical History:  Procedure Laterality Date  . APPENDECTOMY    . Bladder tack  10/2006   cystocoele  . BREAST SURGERY Right    benign cyst  . CARPAL TUNNEL RELEASE Left 10/25/2019   Procedure: LEFT CARPAL TUNNEL RELEASE;  Surgeon: Jessy Oto, MD;  Location: [place];  Service: Orthopedics;  Laterality: Left;  . COLONOSCOPY    . Hysterectomy other    . IRRIGATION AND DEBRIDEMENT SHOULDER Right 02/21/2020   Procedure:  IRRIGATION AND DEBRIDEMENT RIGHT SHOULDER;  Surgeon: Leandrew Koyanagi, MD;  Location: [place];  Service: Orthopedics;  Laterality: Right;  . LUMBAR LAMINECTOMY N/A 03/16/2019   Procedure: CENTRAL LAMINECTOMIES L2-3, L3-4 AND L4-5;  Surgeon: Jessy Oto, MD;  Location: [place];  Service: Orthopedics;  Laterality: N/A;  . TOTAL HIP ARTHROPLASTY Right 04/27/2018   Procedure: RIGHT TOTAL HIP ARTHROPLASTY ANTERIOR APPROACH;  Surgeon: Leandrew Koyanagi, MD;  Location: [facility];  Service: Orthopedics;  Laterality: Right;  . TUBAL LIGATION      There were no vitals filed for this visit.   Subjective Assessment - 05/03/20 1409    Subjective Patient reports her shoulder has been sore today. Yesterday she felt pretty good. She is also having aching in the hips and low back.    Pertinent History Gout, Staph Aureus infection of shld. Cervicalgia.  Lumbar radiculopathy knee pain. R THA  2020, spinal stenosis ( wears back brace)    Limitations Lifting    How long can you sit comfortably? unlimited    How long can you stand comfortably? no issues today    How long can you walk comfortably? before my shld I used to walk 2 x a day 1-2 miles    Diagnostic tests  MRI shld    Currently in Pain? Yes    Pain Score 3     Pain Location Shoulder    Pain Orientation Right    Pain Descriptors / Indicators Aching    Pain Type Chronic pain    Pain Onset More than a month ago    Pain Frequency Constant    Aggravating Factors  use of the shoulder    Pain Relieving Factors rest    Multiple Pain Sites No                             [facility] Adult PT Treatment/Exercise - 05/04/20 0001      Shoulder Exercises: Supine   Protraction --   declined ther-ex 2nd to whole body sorenss     Manual Therapy   Manual Therapy Joint mobilization;Soft tissue mobilization    Manual therapy comments [id number]    Joint Mobilization grade II and III inferior and posterior glides     Soft tissue mobilization trigger point release to upper trap                  PT Education - 05/04/20 1447    Education Details reviewed self exercises.    Person(s) Educated Patient    Methods Explanation;Demonstration;Tactile cues;Verbal cues               PT Long Term Goals - 04/18/20 1537      PT LONG TERM GOAL #1   Title Pt will be independent with advanced HEP    Time 8    Period Weeks    Status New    Target Date 06/13/20      PT LONG TERM GOAL #2   Title Pt will improve shoulder strength to be able to lift at least 20 #s overhead to lift items in kitchen and household shelves    Baseline Unable    Time 8    Period Weeks    Status New    Target Date 06/13/20      PT LONG TERM GOAL #3   Title Pt will be able to carry at least 20  lbs without pain up and down steps    Baseline Unable    Time 8    Status New    Target Date 06/13/20      PT LONG TERM GOAL #4   Title Pt will be able to dress herself without pain and without compensation    Baseline Pt performs dressing with compensatory movements    Time 8    Period Weeks    Status New    Target Date 06/13/20      PT LONG TERM GOAL #5   Title Pt will improve FOTO intake  score to at least 55%    Baseline FOTO  47.6 %    Time 8    Period Weeks    Status New    Target Date 06/13/20                 Plan - 05/04/20 1448    Clinical Impression Statement Patient was limited today. She reported general soreness. she was advised if this continues to talk to her MD. She was having pain in all surgical sites. her motion was more limited today. She was more gaurded despite max cuing Therapy will continue to progress as tolerated.    Comorbidities Gout, Staph Aureus infection of shld. Cervicalgia.  Lumbar radiculopathy  knee pain. R THA  2020, spinal stenosis ( wears back brace) HTN  Spinal stenosis    Examination-Activity Limitations Reach Overhead;Dressing;Hygiene/Grooming;Lift;Carry;Caring for  Others    Examination-Participation Restrictions Volunteer;Laundry    Stability/Clinical Decision Making Evolving/Moderate complexity    Clinical Decision Making Moderate    Rehab Potential Good    PT Frequency 2x / week    PT Treatment/Interventions ADLs/Self Care Home Management;Cryotherapy;Electrical Stimulation;Joint Manipulations;Dry needling;Taping;Passive range of motion;Manual techniques;Neuromuscular re-education;Patient/family education;Therapeutic exercise;Therapeutic activities;Iontophoresis 4mg /ml Dexamethasone;Ultrasound;Moist Heat    PT Next Visit Plan review HEP manual  scar tissue massage, progress HEP    PT Home Exercise Plan [id number]    Consulted and Agree with Plan of Care Patient           Patient will benefit from skilled therapeutic intervention in order to improve the following deficits and impairments:  Pain,Postural dysfunction,Improper body mechanics,Impaired UE functional use,Increased fascial restricitons,Increased muscle spasms,Impaired flexibility,Decreased range of motion,Decreased strength  Visit Diagnosis: Right shoulder pain, unspecified chronicity  Stiffness of right shoulder, not elsewhere classified  Muscle weakness (generalized)     Problem List Patient Active Problem List   Diagnosis Date Noted  . Immunization due 03/22/2020  . Septic arthritis ([place]) 02/25/2020  . Medication monitoring encounter 02/25/2020  . MSSA (methicillin susceptible Staphylococcus aureus) infection 02/24/2020  . Septic arthritis of right acromioclavicular joint ([place])   . Abscess of bursa of right shoulder 02/10/2020  . S/P arthroscopy of right shoulder 01/05/2020  . Carpal tunnel syndrome, left upper limb 10/25/2019    Class: Chronic  . Carpal tunnel syndrome, right upper limb 10/25/2019    Class: Chronic  . Nontraumatic incomplete tear of right rotator cuff 07/27/2019  . Tendinopathy of right biceps tendon 07/27/2019  . Breast cancer screening by mammogram  06/07/2019  . Seasonal allergic rhinitis due to pollen 06/07/2019  . Colon cancer screening 06/07/2019  . Spinal stenosis, lumbar region, with neurogenic claudication 03/16/2019    Class: Chronic  . Status post lumbar laminectomy 03/16/2019  . Acute stress reaction 09/25/2018  . Primary osteoarthritis of right hip 04/27/2018  . Status post total hip replacement, 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  . Shortness of breath 01/02/2018  . Chalazion left upper eyelid 02/20/2017  . Vitamin D insufficiency 11/25/2013  . Healthcare maintenance 11/25/2013  . Spinal stenosis of lumbar region 07/02/2013  . Arthritis, degenerative 11/17/2012  . Arthrosis of right acromioclavicular joint 06/05/2012  . Rectocele 04/28/2012  . Left knee DJD, degenerative meniscus tear 04/06/2012  . Numbness and tingling in hands 03/17/2012  . Periodontitis 09/23/2011  . Depression 09/23/2011  . Right knee meniscal tear 03/14/2011  . Lumbar radiculopathy 03/14/2011  . Functional incontinence 10/18/2010  . NECK PAIN, CHRONIC 08/19/2008  . Iron deficiency anemia 05/12/2008  . SHOULDER PAIN, RIGHT 04/28/2008  . Hyperlipemia 06/18/2007  . Essential hypertension 05/20/2007    Carney Living PT DPT  05/04/2020, 2:51 PM  [facility] [address] [place], [place], [address] Phone: [phone]   Fax:  [phone]  Name: Olivia Ewing MRN: [id number] Date of Birth: [birth year]

## 2020-05-05 ENCOUNTER — Encounter: Payer: Medicare HMO | Admitting: Internal Medicine

## 2020-05-05 ENCOUNTER — Encounter: Payer: Self-pay | Admitting: Physical Therapy

## 2020-05-05 ENCOUNTER — Other Ambulatory Visit: Payer: Self-pay

## 2020-05-05 ENCOUNTER — Ambulatory Visit: Payer: Medicare HMO | Admitting: Physical Therapy

## 2020-05-05 DIAGNOSIS — M25511 Pain in right shoulder: Secondary | ICD-10-CM

## 2020-05-05 DIAGNOSIS — M6281 Muscle weakness (generalized): Secondary | ICD-10-CM

## 2020-05-05 DIAGNOSIS — M25611 Stiffness of right shoulder, not elsewhere classified: Secondary | ICD-10-CM

## 2020-05-05 NOTE — Therapy (Signed)
[place], [place], [address] Phone: [phone]   Fax:  [phone]  Physical Therapy Treatment  Patient Details  Name: Olivia Ewing MRN: [id number] Date of Birth: 15-Dec-1950 Referring Provider (PT): Frankey Shown MD   Encounter Date: 05/05/2020   PT End of Session - 05/05/20 1106    Visit Number 5    Number of Visits 16    Date for PT Re-Evaluation 06/13/20    Authorization Type [employer] MCR/MCD    PT Start Time 1102    PT Stop Time 1140    PT Time Calculation (min) 38 min           Past Medical History:  Diagnosis Date  . Anemia   . Arthritis    knees hands  . Bipolar disorder ([place])   . Cervicalgia   . Depression   . Environmental allergies    cause SOB, uses inhaler for  . Environmental and seasonal allergies    uses inhaler prn  . Full dentures   . GERD (gastroesophageal reflux disease)    diet controlled - no meds  . Hyperlipidemia    diet controlled, no meds  . Hypertension   . Lumbar radiculopathy, chronic   . Right knee pain    posterior horn medial meniscal tear MRI 2013  . Spinal stenosis    getting epidural injections -last one 06/12/2015  . SVD (spontaneous vaginal delivery)    x 4    Past Surgical History:  Procedure Laterality Date  . APPENDECTOMY    . Bladder tack  10/2006   cystocoele  . BREAST SURGERY Right    benign cyst  . CARPAL TUNNEL RELEASE Left 10/25/2019   Procedure: LEFT CARPAL TUNNEL RELEASE;  Surgeon: Jessy Oto, MD;  Location: [facility];  Service: Orthopedics;  Laterality: Left;  . COLONOSCOPY    . Hysterectomy other    . IRRIGATION AND DEBRIDEMENT SHOULDER Right 02/21/2020   Procedure: IRRIGATION AND DEBRIDEMENT RIGHT SHOULDER;  Surgeon: Leandrew Koyanagi, MD;  Location: [facility];  Service: Orthopedics;  Laterality: Right;  . LUMBAR LAMINECTOMY N/A 03/16/2019   Procedure: CENTRAL LAMINECTOMIES L2-3, L3-4 AND L4-5;   Surgeon: Jessy Oto, MD;  Location: [facility];  Service: Orthopedics;  Laterality: N/A;  . TOTAL HIP ARTHROPLASTY Right 04/27/2018   Procedure: RIGHT TOTAL HIP ARTHROPLASTY ANTERIOR APPROACH;  Surgeon: Leandrew Koyanagi, MD;  Location: [facility];  Service: Orthopedics;  Laterality: Right;  . TUBAL LIGATION      There were no vitals filed for this visit.   Subjective Assessment - 05/05/20 1104    Subjective I feel better today. I have not really done my exercises.    Pertinent History Gout, Staph Aureus infection of shld. Cervicalgia.  Lumbar radiculopathy knee pain. R THA  2020, spinal stenosis ( wears back brace)    Limitations Lifting    How long can you sit comfortably? unlimited    How long can you stand comfortably? no issues today    How long can you walk comfortably? before my shld I used to walk 2 x a day 1-2 miles    Diagnostic tests MRI shld    Currently in Pain? No/denies              [facility] PT Assessment - 05/05/20 0001      AROM   Right Shoulder Flexion 100 Degrees   standing, AAROM 128 standing   Right Shoulder ABduction --  scaption AAROM 90   Right Shoulder Internal Rotation --   reach L1   Right Shoulder External Rotation --   Reach back of neck     PROM   Right Shoulder Flexion 140 Degrees    Right Shoulder ABduction 110 Degrees    Right Shoulder Internal Rotation 75 Degrees    Right Shoulder External Rotation 50 Degrees                         [facility] Adult PT Treatment/Exercise - 05/05/20 0001      Shoulder Exercises: Supine   Protraction 15 reps    Protraction Limitations wooden dowel    External Rotation Limitations yellow band x 10 bilat- min pain    Other Supine Exercises supine cane exercises per HEP, chest press, pullovers, ER at neautral and at 45 degrees abduction, horizontal abduction      Shoulder Exercises: Seated   Other Seated Exercises bicep curl x 10 yellow band - increased pain so disc.      Shoulder Exercises: Standing    Extension 20 reps    Theraband Level (Shoulder Extension) Level 2 (Red)    Row Limitations gym machine 10#    Other Standing Exercises standing wand ER , flexion and scaption x 5 each      Shoulder Exercises: Pulleys   Flexion 2 minutes      Shoulder Exercises: ROM/Strengthening   UBE (Upper Arm Bike) L1 2 minutes forward, backwasrd cause min pain so disc.    Cybex Row 15 reps    Cybex Row Limitations 10#      Manual Therapy   Manual therapy comments PROM all planes right shoulder                       PT Long Term Goals - 04/18/20 1537      PT LONG TERM GOAL #1   Title Pt will be independent with advanced HEP    Time 8    Period Weeks    Status New    Target Date 06/13/20      PT LONG TERM GOAL #2   Title Pt will improve shoulder strength to be able to lift at least 20 #s overhead to lift items in kitchen and household shelves    Baseline Unable    Time 8    Period Weeks    Status New    Target Date 06/13/20      PT LONG TERM GOAL #3   Title Pt will be able to carry at least 20  lbs without pain up and down steps    Baseline Unable    Time 8    Status New    Target Date 06/13/20      PT LONG TERM GOAL #4   Title Pt will be able to dress herself without pain and without compensation    Baseline Pt performs dressing with compensatory movements    Time 8    Period Weeks    Status New    Target Date 06/13/20      PT LONG TERM GOAL #5   Title Pt will improve FOTO intake  score to at least 55%    Baseline FOTO  47.6 %    Time 8    Period Weeks    Status New    Target Date 06/13/20  Plan - 05/05/20 1243    Clinical Impression Statement Pt reports feeling better and thinks the weather was effecting her pain last session. She was able to participate in all therex today with reports of intermittent "twinges" of pain. Trial of yellow band ER and bicep curl but painful so did not add to HEP.    PT Treatment/Interventions ADLs/Self  Care Home Management;Cryotherapy;Electrical Stimulation;Joint Manipulations;Dry needling;Taping;Passive range of motion;Manual techniques;Neuromuscular re-education;Patient/family education;Therapeutic exercise;Therapeutic activities;Iontophoresis 4mg /ml Dexamethasone;Ultrasound;Moist Heat    PT Next Visit Plan review HEP manual  scar tissue massage, progress HEP, consider isometrics    PT Home Exercise Plan [id number]           Patient will benefit from skilled therapeutic intervention in order to improve the following deficits and impairments:  Pain,Postural dysfunction,Improper body mechanics,Impaired UE functional use,Increased fascial restricitons,Increased muscle spasms,Impaired flexibility,Decreased range of motion,Decreased strength  Visit Diagnosis: Right shoulder pain, unspecified chronicity  Stiffness of right shoulder, not elsewhere classified  Muscle weakness (generalized)     Problem List Patient Active Problem List   Diagnosis Date Noted  . Immunization due 03/22/2020  . Septic arthritis ([place]) 02/25/2020  . Medication monitoring encounter 02/25/2020  . MSSA (methicillin susceptible Staphylococcus aureus) infection 02/24/2020  . Septic arthritis of right acromioclavicular joint ([place])   . Abscess of bursa of right shoulder 02/10/2020  . S/P arthroscopy of right shoulder 01/05/2020  . Carpal tunnel syndrome, left upper limb 10/25/2019    Class: Chronic  . Carpal tunnel syndrome, right upper limb 10/25/2019    Class: Chronic  . Nontraumatic incomplete tear of right rotator cuff 07/27/2019  . Tendinopathy of right biceps tendon 07/27/2019  . Breast cancer screening by mammogram 06/07/2019  . Seasonal allergic rhinitis due to pollen 06/07/2019  . Colon cancer screening 06/07/2019  . Spinal stenosis, lumbar region, with neurogenic claudication 03/16/2019    Class: Chronic  . Status post lumbar laminectomy 03/16/2019  . Acute stress reaction 09/25/2018  . Primary  osteoarthritis of right hip 04/27/2018  . Status post total hip replacement, 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  . Shortness of breath 01/02/2018  . Chalazion left upper eyelid 02/20/2017  . Vitamin D insufficiency 11/25/2013  . Healthcare maintenance 11/25/2013  . Spinal stenosis of lumbar region 07/02/2013  . Arthritis, degenerative 11/17/2012  . Arthrosis of right acromioclavicular joint 06/05/2012  . Rectocele 04/28/2012  . Left knee DJD, degenerative meniscus tear 04/06/2012  . Numbness and tingling in hands 03/17/2012  . Periodontitis 09/23/2011  . Depression 09/23/2011  . Right knee meniscal tear 03/14/2011  . Lumbar radiculopathy 03/14/2011  . Functional incontinence 10/18/2010  . NECK PAIN, CHRONIC 08/19/2008  . Iron deficiency anemia 05/12/2008  . SHOULDER PAIN, RIGHT 04/28/2008  . Hyperlipemia 06/18/2007  . Essential hypertension 05/20/2007    Dorene Ar, PTA 05/05/2020, 12:47 PM  [facility] [address] [place], [place], [address] Phone: [phone]   Fax:  [phone]  Name: TANJI STORRS MRN: [id number] Date of Birth: 1950-08-17

## 2020-05-08 ENCOUNTER — Other Ambulatory Visit: Payer: Self-pay

## 2020-05-08 ENCOUNTER — Encounter: Payer: Self-pay | Admitting: Physical Therapy

## 2020-05-08 ENCOUNTER — Ambulatory Visit: Payer: Medicare HMO | Admitting: Physical Therapy

## 2020-05-08 DIAGNOSIS — M25511 Pain in right shoulder: Secondary | ICD-10-CM

## 2020-05-08 DIAGNOSIS — M6281 Muscle weakness (generalized): Secondary | ICD-10-CM

## 2020-05-08 DIAGNOSIS — M25611 Stiffness of right shoulder, not elsewhere classified: Secondary | ICD-10-CM

## 2020-05-08 NOTE — Therapy (Signed)
[place], [place], [address] Phone: [phone]   Fax:  [phone]  Physical Therapy Treatment  Patient Details  Name: Olivia Ewing MRN: [id number] Date of Birth: [birth year] Referring Provider (PT): Frankey Shown MD   Encounter Date: 05/08/2020   PT End of Session - 05/08/20 1432    Visit Number 6    Number of Visits 16    Authorization Type [employer] MCR/MCD    PT Start Time 3154    PT Stop Time 1412    PT Time Calculation (min) 38 min    Activity Tolerance Patient tolerated treatment well;Patient limited by pain    Behavior During Therapy [facility] for tasks assessed/performed           Past Medical History:  Diagnosis Date  . Anemia   . Arthritis    knees hands  . Bipolar disorder ([place])   . Cervicalgia   . Depression   . Environmental allergies    cause SOB, uses inhaler for  . Environmental and seasonal allergies    uses inhaler prn  . Full dentures   . GERD (gastroesophageal reflux disease)    diet controlled - no meds  . Hyperlipidemia    diet controlled, no meds  . Hypertension   . Lumbar radiculopathy, chronic   . Right knee pain    posterior horn medial meniscal tear MRI 2013  . Spinal stenosis    getting epidural injections -last one 06/12/2015  . SVD (spontaneous vaginal delivery)    x 4    Past Surgical History:  Procedure Laterality Date  . APPENDECTOMY    . Bladder tack  10/2006   cystocoele  . BREAST SURGERY Right    benign cyst  . CARPAL TUNNEL RELEASE Left 10/25/2019   Procedure: LEFT CARPAL TUNNEL RELEASE;  Surgeon: Jessy Oto, MD;  Location: [place];  Service: Orthopedics;  Laterality: Left;  . COLONOSCOPY    . Hysterectomy other    . IRRIGATION AND DEBRIDEMENT SHOULDER Right 02/21/2020   Procedure: IRRIGATION AND DEBRIDEMENT RIGHT SHOULDER;  Surgeon: Leandrew Koyanagi, MD;  Location: [place];  Service: Orthopedics;  Laterality:  Right;  . LUMBAR LAMINECTOMY N/A 03/16/2019   Procedure: CENTRAL LAMINECTOMIES L2-3, L3-4 AND L4-5;  Surgeon: Jessy Oto, MD;  Location: [place];  Service: Orthopedics;  Laterality: N/A;  . TOTAL HIP ARTHROPLASTY Right 04/27/2018   Procedure: RIGHT TOTAL HIP ARTHROPLASTY ANTERIOR APPROACH;  Surgeon: Leandrew Koyanagi, MD;  Location: [place];  Service: Orthopedics;  Laterality: Right;  . TUBAL LIGATION      There were no vitals filed for this visit.                      [facility] Adult PT Treatment/Exercise - 05/08/20 0001      Shoulder Exercises: Supine   Protraction Limitations wooden dowel    External Rotation Limitations yellow band x 10 bilat- min pain    Other Supine Exercises supine cane exercises per HEP, chest press, pullovers, ER at neautral x15 each      Shoulder Exercises: Standing   Extension 20 reps    Theraband Level (Shoulder Extension) Level 2 (Red)    Row --    Theraband Level (Shoulder Row) --    Row Limitations cybex row 10# 3x10      Shoulder Exercises: Pulleys   Flexion 2 minutes      Shoulder Exercises: ROM/Strengthening  UBE (Upper Arm Bike) L1 2 minutes forward, unable to do backwards      Manual Therapy   Manual therapy comments PROM all planes right shoulder    Joint Mobilization grade II and III inferior and posterior glides    Soft tissue mobilization trigger point release to upper trap                       PT Long Term Goals - 04/18/20 1537      PT LONG TERM GOAL #1   Title Pt will be independent with advanced HEP    Time 8    Period Weeks    Status New    Target Date 06/13/20      PT LONG TERM GOAL #2   Title Pt will improve shoulder strength to be able to lift at least 20 #s overhead to lift items in kitchen and household shelves    Baseline Unable    Time 8    Period Weeks    Status New    Target Date 06/13/20      PT LONG TERM GOAL #3   Title Pt will be able to carry at least 20  lbs without pain up and down  steps    Baseline Unable    Time 8    Status New    Target Date 06/13/20      PT LONG TERM GOAL #4   Title Pt will be able to dress herself without pain and without compensation    Baseline Pt performs dressing with compensatory movements    Time 8    Period Weeks    Status New    Target Date 06/13/20      PT LONG TERM GOAL #5   Title Pt will improve FOTO intake  score to at least 55%    Baseline FOTO  47.6 %    Time 8    Period Weeks    Status New    Target Date 06/13/20                 Plan - 05/08/20 1445    Clinical Impression Statement Patient is doing better. She continues to have pinching in her shoulder at times with forward flexion. When the patients arm is moved into a different plane she has less pain. She was shown on the model why this happens. Per visual inspection her passive ROM has improved significantly. She is making good porgress.    Personal Factors and Comorbidities Comorbidity 2    Comorbidities Gout, Staph Aureus infection of shld. Cervicalgia.  Lumbar radiculopathy knee pain. R THA  2020, spinal stenosis ( wears back brace) HTN  Spinal stenosis    Examination-Activity Limitations Reach Overhead;Dressing;Hygiene/Grooming;Lift;Carry;Caring for Others    Examination-Participation Restrictions Volunteer;Laundry    Stability/Clinical Decision Making Evolving/Moderate complexity    Clinical Decision Making Moderate    Rehab Potential Good    PT Frequency 2x / week    PT Duration 8 weeks    PT Treatment/Interventions ADLs/Self Care Home Management;Cryotherapy;Electrical Stimulation;Joint Manipulations;Dry needling;Taping;Passive range of motion;Manual techniques;Neuromuscular re-education;Patient/family education;Therapeutic exercise;Therapeutic activities;Iontophoresis 4mg /ml Dexamethasone;Ultrasound;Moist Heat    PT Next Visit Plan review HEP manual  scar tissue massage, progress HEP, consider isometrics    PT Home Exercise Plan [id number]    Consulted  and Agree with Plan of Care Patient           Patient will benefit from skilled therapeutic intervention in order to improve the  following deficits and impairments:  Pain,Postural dysfunction,Improper body mechanics,Impaired UE functional use,Increased fascial restricitons,Increased muscle spasms,Impaired flexibility,Decreased range of motion,Decreased strength  Visit Diagnosis: Right shoulder pain, unspecified chronicity  Stiffness of right shoulder, not elsewhere classified  Muscle weakness (generalized)     Problem List Patient Active Problem List   Diagnosis Date Noted  . Immunization due 03/22/2020  . Septic arthritis ([place]) 02/25/2020  . Medication monitoring encounter 02/25/2020  . MSSA (methicillin susceptible Staphylococcus aureus) infection 02/24/2020  . Septic arthritis of right acromioclavicular joint ([place])   . Abscess of bursa of right shoulder 02/10/2020  . S/P arthroscopy of right shoulder 01/05/2020  . Carpal tunnel syndrome, left upper limb 10/25/2019    Class: Chronic  . Carpal tunnel syndrome, right upper limb 10/25/2019    Class: Chronic  . Nontraumatic incomplete tear of right rotator cuff 07/27/2019  . Tendinopathy of right biceps tendon 07/27/2019  . Breast cancer screening by mammogram 06/07/2019  . Seasonal allergic rhinitis due to pollen 06/07/2019  . Colon cancer screening 06/07/2019  . Spinal stenosis, lumbar region, with neurogenic claudication 03/16/2019    Class: Chronic  . Status post lumbar laminectomy 03/16/2019  . Acute stress reaction 09/25/2018  . Primary osteoarthritis of right hip 04/27/2018  . Status post total hip replacement, 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  . Shortness of breath 01/02/2018  . Chalazion left upper eyelid 02/20/2017  . Vitamin D insufficiency 11/25/2013  . Healthcare maintenance 11/25/2013  . Spinal stenosis of lumbar region 07/02/2013  . Arthritis, degenerative 11/17/2012  . Arthrosis of right acromioclavicular joint  06/05/2012  . Rectocele 04/28/2012  . Left knee DJD, degenerative meniscus tear 04/06/2012  . Numbness and tingling in hands 03/17/2012  . Periodontitis 09/23/2011  . Depression 09/23/2011  . Right knee meniscal tear 03/14/2011  . Lumbar radiculopathy 03/14/2011  . Functional incontinence 10/18/2010  . NECK PAIN, CHRONIC 08/19/2008  . Iron deficiency anemia 05/12/2008  . SHOULDER PAIN, RIGHT 04/28/2008  . Hyperlipemia 06/18/2007  . Essential hypertension 05/20/2007    Carney Living PT DPT  05/08/2020, 2:48 PM  [facility] [address] [place], [place], [address] Phone: [phone]   Fax:  [phone]  Name: Olivia Ewing MRN: [id number] Date of Birth: 06/08/1950

## 2020-05-10 ENCOUNTER — Ambulatory Visit: Payer: Medicare HMO | Admitting: Physical Therapy

## 2020-05-10 ENCOUNTER — Ambulatory Visit (INDEPENDENT_AMBULATORY_CARE_PROVIDER_SITE_OTHER): Payer: Medicare HMO | Admitting: Internal Medicine

## 2020-05-10 ENCOUNTER — Encounter: Payer: Self-pay | Admitting: Internal Medicine

## 2020-05-10 ENCOUNTER — Encounter: Payer: Self-pay | Admitting: Physical Therapy

## 2020-05-10 ENCOUNTER — Other Ambulatory Visit: Payer: Self-pay

## 2020-05-10 VITALS — BP 118/94 | HR 78 | Temp 100.0°F | Ht 65.0 in | Wt 184.8 lb

## 2020-05-10 DIAGNOSIS — J3489 Other specified disorders of nose and nasal sinuses: Secondary | ICD-10-CM

## 2020-05-10 DIAGNOSIS — M25511 Pain in right shoulder: Secondary | ICD-10-CM | POA: Diagnosis not present

## 2020-05-10 DIAGNOSIS — M6281 Muscle weakness (generalized): Secondary | ICD-10-CM

## 2020-05-10 DIAGNOSIS — M25611 Stiffness of right shoulder, not elsewhere classified: Secondary | ICD-10-CM

## 2020-05-10 DIAGNOSIS — J301 Allergic rhinitis due to pollen: Secondary | ICD-10-CM | POA: Diagnosis not present

## 2020-05-10 MED ORDER — FLUTICASONE PROPIONATE 50 MCG/ACT NA SUSP: 1.0000 | Freq: Every day | NASAL | 3 refills | Status: DC

## 2020-05-10 MED ORDER — SALINE SPRAY 0.65 % NA SOLN: 1.0000 | Freq: Two times a day (BID) | NASAL | 0 refills | Status: DC | PRN

## 2020-05-10 MED ORDER — SALINE SPRAY 0.65 % NA SOLN: 1.0000 | Freq: Two times a day (BID) | NASAL | 0 refills | Status: AC | PRN
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
## 2020-05-10 NOTE — Patient Instructions (Signed)
Thank you for allowing [name] to provide your care today. Today we discussed your nasal soreness  Please START taking  Saline nasal spray - use 1-2 times per day for nasal dryness   Please call if symptoms do not improve in two weeks and we can place referral to ENT.    Please call the internal medicine center clinic if you have any questions or concerns, we may be able to help and keep you from a long and expensive emergency room wait. Our clinic and after hours phone number is [phone], the best time to call is Monday through Friday 9 am to 4 pm but there is always someone available 24/7 if you have an emergency. If you need medication refills please notify your pharmacy one week in advance and they will send [name] a request.

## 2020-05-11 ENCOUNTER — Encounter: Payer: Self-pay | Admitting: Physical Therapy

## 2020-05-11 ENCOUNTER — Other Ambulatory Visit: Payer: Self-pay | Admitting: Internal Medicine

## 2020-05-11 DIAGNOSIS — I10 Essential (primary) hypertension: Secondary | ICD-10-CM

## 2020-05-11 NOTE — Therapy (Signed)
[place], [place], [address] Phone: [phone]   Fax:  [phone]  Physical Therapy Treatment  Patient Details  Name: Olivia Ewing MRN: [id number] Date of Birth: 1951/02/01 Referring Provider (PT): Frankey Shown MD   Encounter Date: 05/10/2020   PT End of Session - 05/10/20 1339    Visit Number 7    Number of Visits 16    Date for PT Re-Evaluation 06/13/20    Authorization Type [employer] MCR/MCD    PT Start Time 1330    PT Stop Time 1412    PT Time Calculation (min) 42 min    Activity Tolerance Patient tolerated treatment well;Patient limited by pain    Behavior During Therapy [facility] for tasks assessed/performed           Past Medical History:  Diagnosis Date  . Anemia   . Arthritis    knees hands  . Bipolar disorder ([place])   . Cervicalgia   . Depression   . Environmental allergies    cause SOB, uses inhaler for  . Environmental and seasonal allergies    uses inhaler prn  . Full dentures   . GERD (gastroesophageal reflux disease)    diet controlled - no meds  . Hyperlipidemia    diet controlled, no meds  . Hypertension   . Lumbar radiculopathy, chronic   . Right knee pain    posterior horn medial meniscal tear MRI 2013  . Spinal stenosis    getting epidural injections -last one 06/12/2015  . SVD (spontaneous vaginal delivery)    x 4    Past Surgical History:  Procedure Laterality Date  . APPENDECTOMY    . Bladder tack  10/2006   cystocoele  . BREAST SURGERY Right    benign cyst  . CARPAL TUNNEL RELEASE Left 10/25/2019   Procedure: LEFT CARPAL TUNNEL RELEASE;  Surgeon: Jessy Oto, MD;  Location: [place];  Service: Orthopedics;  Laterality: Left;  . COLONOSCOPY    . Hysterectomy other    . IRRIGATION AND DEBRIDEMENT SHOULDER Right 02/21/2020   Procedure: IRRIGATION AND DEBRIDEMENT RIGHT SHOULDER;  Surgeon: Leandrew Koyanagi, MD;  Location: [place];   Service: Orthopedics;  Laterality: Right;  . LUMBAR LAMINECTOMY N/A 03/16/2019   Procedure: CENTRAL LAMINECTOMIES L2-3, L3-4 AND L4-5;  Surgeon: Jessy Oto, MD;  Location: [place];  Service: Orthopedics;  Laterality: N/A;  . TOTAL HIP ARTHROPLASTY Right 04/27/2018   Procedure: RIGHT TOTAL HIP ARTHROPLASTY ANTERIOR APPROACH;  Surgeon: Leandrew Koyanagi, MD;  Location: [place];  Service: Orthopedics;  Laterality: Right;  . TUBAL LIGATION      There were no vitals filed for this visit.   Subjective Assessment - 05/10/20 1338    Subjective Patient reports it is doing better. She is having less pain in her shoulder. She reports it feels better when hot water runs on it.    Pertinent History Gout, Staph Aureus infection of shld. Cervicalgia.  Lumbar radiculopathy knee pain. R THA  2020, spinal stenosis ( wears back brace)    Limitations Lifting    How long can you sit comfortably? unlimited    How long can you stand comfortably? no issues today    How long can you walk comfortably? before my shld I used to walk 2 x a day 1-2 miles    Diagnostic tests MRI shld    Currently in Pain? No/denies  [facility] Adult PT Treatment/Exercise - 05/11/20 0001      Shoulder Exercises: Supine   Protraction Limitations wooden dowel    External Rotation Limitations yellow band x 10 bilat- min pain    Other Supine Exercises supine cane exercises per HEP, chest press, pullovers, ER at neautral x15 each      Shoulder Exercises: Standing   Extension 20 reps    Theraband Level (Shoulder Extension) Level 2 (Red)    Row Limitations cybex row 10# 3x10      Shoulder Exercises: Pulleys   Flexion 2 minutes      Shoulder Exercises: ROM/Strengthening   UBE (Upper Arm Bike) L1 2 minutes forward,1 min back      Manual Therapy   Manual therapy comments PROM all planes right shoulder    Joint Mobilization grade II and III inferior and posterior glides    Soft tissue mobilization  trigger point release to upper trap                  PT Education - 05/10/20 1338    Education Details reviewed HEP and symptom mangement    Person(s) Educated Patient    Methods Explanation;Demonstration;Tactile cues;Verbal cues    Comprehension Verbalized understanding;Returned demonstration;Verbal cues required;Tactile cues required               PT Long Term Goals - 04/18/20 1537      PT LONG TERM GOAL #1   Title Pt will be independent with advanced HEP    Time 8    Period Weeks    Status New    Target Date 06/13/20      PT LONG TERM GOAL #2   Title Pt will improve shoulder strength to be able to lift at least 20 #s overhead to lift items in kitchen and household shelves    Baseline Unable    Time 8    Period Weeks    Status New    Target Date 06/13/20      PT LONG TERM GOAL #3   Title Pt will be able to carry at least 20  lbs without pain up and down steps    Baseline Unable    Time 8    Status New    Target Date 06/13/20      PT LONG TERM GOAL #4   Title Pt will be able to dress herself without pain and without compensation    Baseline Pt performs dressing with compensatory movements    Time 8    Period Weeks    Status New    Target Date 06/13/20      PT LONG TERM GOAL #5   Title Pt will improve FOTO intake  score to at least 55%    Baseline FOTO  47.6 %    Time 8    Period Weeks    Status New    Target Date 06/13/20                 Plan - 05/10/20 1356    Clinical Impression Statement Patient had less cathing today with certain positions.She had some tightness with ER. Therapy reviewed how to use the cane for self ER stretching. She is making good progress. She tolerated all ther-ex well.    Personal Factors and Comorbidities Comorbidity 2    Comorbidities Gout, Staph Aureus infection of shld. Cervicalgia.  Lumbar radiculopathy knee pain. R THA  2020, spinal stenosis ( wears back brace) HTN  Spinal stenosis  Examination-Activity  Limitations Reach Overhead;Dressing;Hygiene/Grooming;Lift;Carry;Caring for Others    Examination-Participation Restrictions Volunteer;Laundry    Stability/Clinical Decision Making Evolving/Moderate complexity    Clinical Decision Making Moderate    Rehab Potential Good    PT Frequency 2x / week    PT Duration 8 weeks    PT Treatment/Interventions ADLs/Self Care Home Management;Cryotherapy;Electrical Stimulation;Joint Manipulations;Dry needling;Taping;Passive range of motion;Manual techniques;Neuromuscular re-education;Patient/family education;Therapeutic exercise;Therapeutic activities;Iontophoresis 4mg /ml Dexamethasone;Ultrasound;Moist Heat    PT Next Visit Plan review HEP manual  scar tissue massage, progress HEP, consider isometrics    PT Home Exercise Plan [id number]    Consulted and Agree with Plan of Care Patient           Patient will benefit from skilled therapeutic intervention in order to improve the following deficits and impairments:  Pain,Postural dysfunction,Improper body mechanics,Impaired UE functional use,Increased fascial restricitons,Increased muscle spasms,Impaired flexibility,Decreased range of motion,Decreased strength  Visit Diagnosis: Right shoulder pain, unspecified chronicity  Stiffness of right shoulder, not elsewhere classified  Muscle weakness (generalized)     Problem List Patient Active Problem List   Diagnosis Date Noted  . Immunization due 03/22/2020  . Septic arthritis ([place]) 02/25/2020  . Medication monitoring encounter 02/25/2020  . MSSA (methicillin susceptible Staphylococcus aureus) infection 02/24/2020  . Septic arthritis of right acromioclavicular joint ([place])   . Abscess of bursa of right shoulder 02/10/2020  . S/P arthroscopy of right shoulder 01/05/2020  . Carpal tunnel syndrome, left upper limb 10/25/2019    Class: Chronic  . Carpal tunnel syndrome, right upper limb 10/25/2019    Class: Chronic  . Nontraumatic incomplete tear of right  rotator cuff 07/27/2019  . Tendinopathy of right biceps tendon 07/27/2019  . Breast cancer screening by mammogram 06/07/2019  . Seasonal allergic rhinitis due to pollen 06/07/2019  . Colon cancer screening 06/07/2019  . Spinal stenosis, lumbar region, with neurogenic claudication 03/16/2019    Class: Chronic  . Status post lumbar laminectomy 03/16/2019  . Acute stress reaction 09/25/2018  . Primary osteoarthritis of right hip 04/27/2018  . Status post total hip replacement, 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  . Shortness of breath 01/02/2018  . Chalazion left upper eyelid 02/20/2017  . Vitamin D insufficiency 11/25/2013  . Healthcare maintenance 11/25/2013  . Spinal stenosis of lumbar region 07/02/2013  . Arthritis, degenerative 11/17/2012  . Arthrosis of right acromioclavicular joint 06/05/2012  . Rectocele 04/28/2012  . Left knee DJD, degenerative meniscus tear 04/06/2012  . Numbness and tingling in hands 03/17/2012  . Periodontitis 09/23/2011  . Depression 09/23/2011  . Right knee meniscal tear 03/14/2011  . Lumbar radiculopathy 03/14/2011  . Functional incontinence 10/18/2010  . NECK PAIN, CHRONIC 08/19/2008  . Iron deficiency anemia 05/12/2008  . SHOULDER PAIN, RIGHT 04/28/2008  . Hyperlipemia 06/18/2007  . Essential hypertension 05/20/2007    Carney Living PT DPT  05/11/2020, 1:10 PM  [facility] [address] [place], [place], [address] Phone: [phone]   Fax:  [phone]  Name: Olivia Ewing MRN: [id number] Date of Birth: July 18, 1950
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
## 2020-05-15 ENCOUNTER — Encounter: Payer: Self-pay | Admitting: *Deleted

## 2020-05-15 NOTE — [place] Notes (Signed)
Internal Medicine Clinic Attending  Case discussed with Dr. Seawell  At the time of the visit.  We reviewed the resident's history and exam and pertinent patient test results.  I agree with the assessment, diagnosis, and plan of care documented in the resident's note.  

## 2020-05-16 ENCOUNTER — Ambulatory Visit: Payer: Medicare HMO | Admitting: Behavioral Health

## 2020-05-16 ENCOUNTER — Other Ambulatory Visit: Payer: Self-pay

## 2020-05-16 DIAGNOSIS — F4323 Adjustment disorder with mixed anxiety and depressed mood: Secondary | ICD-10-CM
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
## 2020-05-18 ENCOUNTER — Other Ambulatory Visit: Payer: Self-pay | Admitting: Specialist

## 2020-05-18 ENCOUNTER — Other Ambulatory Visit: Payer: Self-pay | Admitting: Internal Medicine

## 2020-05-18 DIAGNOSIS — E782 Mixed hyperlipidemia: Secondary | ICD-10-CM

## 2020-05-18 DIAGNOSIS — D508 Other iron deficiency anemias: Secondary | ICD-10-CM

## 2020-05-19 ENCOUNTER — Ambulatory Visit: Payer: Medicare HMO | Admitting: Specialist

## 2020-05-24 ENCOUNTER — Ambulatory Visit: Payer: Medicare HMO | Admitting: Physical Therapy

## 2020-05-25 ENCOUNTER — Encounter: Payer: Self-pay | Admitting: Physical Therapy

## 2020-05-25 ENCOUNTER — Other Ambulatory Visit: Payer: Self-pay

## 2020-05-25 ENCOUNTER — Ambulatory Visit: Payer: Medicare HMO | Attending: Orthopaedic Surgery | Admitting: Physical Therapy

## 2020-05-25 DIAGNOSIS — M25611 Stiffness of right shoulder, not elsewhere classified: Secondary | ICD-10-CM | POA: Insufficient documentation

## 2020-05-25 DIAGNOSIS — M25511 Pain in right shoulder: Secondary | ICD-10-CM | POA: Insufficient documentation

## 2020-05-25 DIAGNOSIS — M6281 Muscle weakness (generalized): Secondary | ICD-10-CM | POA: Insufficient documentation

## 2020-05-26 ENCOUNTER — Encounter: Payer: Self-pay | Admitting: Physical Therapy

## 2020-05-26 NOTE — Therapy (Signed)
[place], [place], [address] Phone: [phone]   Fax:  [phone]  Physical Therapy Treatment  Patient Details  Name: Olivia Ewing MRN: [id number] Date of Birth: 07-03-1950 Referring Provider (PT): Frankey Shown MD   Encounter Date: 05/25/2020   PT End of Session - 05/25/20 1336    Visit Number 8    Number of Visits 16    Authorization Type [employer] MCR/MCD    PT Start Time 1330    PT Stop Time 1410    PT Time Calculation (min) 40 min    Activity Tolerance Patient tolerated treatment well;Patient limited by pain    Behavior During Therapy [facility] for tasks assessed/performed           Past Medical History:  Diagnosis Date  . Anemia   . Arthritis    knees hands  . Bipolar disorder ([place])   . Cervicalgia   . Depression   . Environmental allergies    cause SOB, uses inhaler for  . Environmental and seasonal allergies    uses inhaler prn  . Full dentures   . GERD (gastroesophageal reflux disease)    diet controlled - no meds  . Hyperlipidemia    diet controlled, no meds  . Hypertension   . Lumbar radiculopathy, chronic   . Right knee pain    posterior horn medial meniscal tear MRI 2013  . Spinal stenosis    getting epidural injections -last one 06/12/2015  . SVD (spontaneous vaginal delivery)    x 4    Past Surgical History:  Procedure Laterality Date  . APPENDECTOMY    . Bladder tack  10/2006   cystocoele  . BREAST SURGERY Right    benign cyst  . CARPAL TUNNEL RELEASE Left 10/25/2019   Procedure: LEFT CARPAL TUNNEL RELEASE;  Surgeon: Jessy Oto, MD;  Location: [place];  Service: Orthopedics;  Laterality: Left;  . COLONOSCOPY    . Hysterectomy other    . IRRIGATION AND DEBRIDEMENT SHOULDER Right 02/21/2020   Procedure: IRRIGATION AND DEBRIDEMENT RIGHT SHOULDER;  Surgeon: Leandrew Koyanagi, MD;  Location: [place];  Service: Orthopedics;  Laterality:  Right;  . LUMBAR LAMINECTOMY N/A 03/16/2019   Procedure: CENTRAL LAMINECTOMIES L2-3, L3-4 AND L4-5;  Surgeon: Jessy Oto, MD;  Location: [place];  Service: Orthopedics;  Laterality: N/A;  . TOTAL HIP ARTHROPLASTY Right 04/27/2018   Procedure: RIGHT TOTAL HIP ARTHROPLASTY ANTERIOR APPROACH;  Surgeon: Leandrew Koyanagi, MD;  Location: [place];  Service: Orthopedics;  Laterality: Right;  . TUBAL LIGATION      There were no vitals filed for this visit.   Subjective Assessment - 05/25/20 1334    Subjective Patient reports her shoulder is doing good.    Pertinent History Gout, Staph Aureus infection of shld. Cervicalgia.  Lumbar radiculopathy knee pain. R THA  2020, spinal stenosis ( wears back brace)    Limitations Lifting    How long can you sit comfortably? unlimited    How long can you stand comfortably? no issues today    How long can you walk comfortably? before my shld I used to walk 2 x a day 1-2 miles    Diagnostic tests MRI shld    Currently in Pain? No/denies                             [facility] Adult PT Treatment/Exercise -  05/26/20 0001      Shoulder Exercises: Supine   Protraction Limitations wooden dowel    Other Supine Exercises supine cane flexion 1 lb x20; cane er 2x10 5 sec hold mod cuing for proper techniuqe. Patinet eneded up makiing herself sore      Shoulder Exercises: Standing   External Rotation Limitations 2x10 yellow    Internal Rotation Limitations 2x10 red    Extension 20 reps    Theraband Level (Shoulder Extension) Level 2 (Red)    Row Limitations red band x20      Shoulder Exercises: Pulleys   Flexion 2 minutes      Shoulder Exercises: ROM/Strengthening   UBE (Upper Arm Bike) L1 2 minutes forward,2 min back      Manual Therapy   Manual therapy comments PROM all planes right shoulder    Joint Mobilization grade II and III inferior and posterior glides    Soft tissue mobilization trigger point release to upper trap                   PT Education - 05/25/20 1335    Education Details reviewed HEP and symptom mangement    Person(s) Educated Patient    Methods Explanation;Demonstration;Tactile cues;Verbal cues    Comprehension Verbalized understanding;Returned demonstration;Verbal cues required;Tactile cues required               PT Long Term Goals - 05/26/20 0733      PT LONG TERM GOAL #1   Title Pt will be independent with advanced HEP    Time 8    Period Weeks    Status On-going      PT LONG TERM GOAL #2   Title Pt will improve shoulder strength to be able to lift at least 20 #s overhead to lift items in kitchen and household shelves    Baseline Unable    Time 8    Period Weeks    Status On-going      PT LONG TERM GOAL #3   Title Pt will be able to carry at least 20  lbs without pain up and down steps    Baseline Unable    Time 8    Period Weeks    Status On-going      PT LONG TERM GOAL #4   Title Pt will be able to dress herself without pain and without compensation    Baseline Pt performs dressing with compensatory movements    Time 8    Period Weeks    Status On-going      PT LONG TERM GOAL #5   Title Pt will improve FOTO intake  score to at least 55%    Baseline FOTO  47.6 %    Time 8    Period Weeks    Status On-going                 Plan - 05/25/20 1446    Clinical Impression Statement Patient is continues to make good progress. She had minor pain with ther-ex today but she was able to complete all tasks. She had motion restrictions in ER> Therapy reviewed ER with the cane but she pushed too hard and caused herself pain. Therapy reviewed symptom mangement with exercises.    Comorbidities Gout, Staph Aureus infection of shld. Cervicalgia.  Lumbar radiculopathy knee pain. R THA  2020, spinal stenosis ( wears back brace) HTN  Spinal stenosis    Examination-Activity Limitations Reach Overhead;Dressing;Hygiene/Grooming;Lift;Carry;Caring for Others  Examination-Participation Restrictions Volunteer;Laundry    Stability/Clinical Decision Making Evolving/Moderate complexity    Clinical Decision Making Moderate    Rehab Potential Good    PT Frequency 2x / week    PT Duration 8 weeks    PT Treatment/Interventions ADLs/Self Care Home Management;Cryotherapy;Electrical Stimulation;Joint Manipulations;Dry needling;Taping;Passive range of motion;Manual techniques;Neuromuscular re-education;Patient/family education;Therapeutic exercise;Therapeutic activities;Iontophoresis 4mg /ml Dexamethasone;Ultrasound;Moist Heat    PT Next Visit Plan review HEP manual  scar tissue massage, progress HEP, consider isometrics    PT Home Exercise Plan [id number]    Consulted and Agree with Plan of Care Patient           Patient will benefit from skilled therapeutic intervention in order to improve the following deficits and impairments:  Pain,Postural dysfunction,Improper body mechanics,Impaired UE functional use,Increased fascial restricitons,Increased muscle spasms,Impaired flexibility,Decreased range of motion,Decreased strength  Visit Diagnosis: Right shoulder pain, unspecified chronicity  Stiffness of right shoulder, not elsewhere classified  Muscle weakness (generalized)     Problem List Patient Active Problem List   Diagnosis Date Noted  . Immunization due 03/22/2020  . Septic arthritis ([place]) 02/25/2020  . Medication monitoring encounter 02/25/2020  . MSSA (methicillin susceptible Staphylococcus aureus) infection 02/24/2020  . Septic arthritis of right acromioclavicular joint ([place])   . Abscess of bursa of right shoulder 02/10/2020  . S/P arthroscopy of right shoulder 01/05/2020  . Carpal tunnel syndrome, left upper limb 10/25/2019    Class: Chronic  . Carpal tunnel syndrome, right upper limb 10/25/2019    Class: Chronic  . Nontraumatic incomplete tear of right rotator cuff 07/27/2019  . Tendinopathy of right biceps tendon 07/27/2019  . Breast  cancer screening by mammogram 06/07/2019  . Seasonal allergic rhinitis due to pollen 06/07/2019  . Colon cancer screening 06/07/2019  . Spinal stenosis, lumbar region, with neurogenic claudication 03/16/2019    Class: Chronic  . Status post lumbar laminectomy 03/16/2019  . Acute stress reaction 09/25/2018  . Primary osteoarthritis of right hip 04/27/2018  . Status post total hip replacement, 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  . Shortness of breath 01/02/2018  . Chalazion left upper eyelid 02/20/2017  . Vitamin D insufficiency 11/25/2013  . Healthcare maintenance 11/25/2013  . Spinal stenosis of lumbar region 07/02/2013  . Arthritis, degenerative 11/17/2012  . Arthrosis of right acromioclavicular joint 06/05/2012  . Rectocele 04/28/2012  . Left knee DJD, degenerative meniscus tear 04/06/2012  . Numbness and tingling in hands 03/17/2012  . Periodontitis 09/23/2011  . Depression 09/23/2011  . Right knee meniscal tear 03/14/2011  . Lumbar radiculopathy 03/14/2011  . Functional incontinence 10/18/2010  . NECK PAIN, CHRONIC 08/19/2008  . Iron deficiency anemia 05/12/2008  . SHOULDER PAIN, RIGHT 04/28/2008  . Hyperlipemia 06/18/2007  . Essential hypertension 05/20/2007    Carney Living PT DPT  05/26/2020, 7:35 AM  [facility] [address] [place], [place], [address] Phone: [phone]   Fax:  [phone]  Name: Olivia Ewing MRN: [id number] Date of Birth: 01/03/1951

## 2020-05-29 ENCOUNTER — Ambulatory Visit: Payer: Medicare HMO | Admitting: Physical Therapy

## 2020-05-29 ENCOUNTER — Telehealth: Payer: Self-pay | Admitting: Physical Therapy

## 2020-05-29 ENCOUNTER — Telehealth: Payer: Self-pay | Admitting: Orthopaedic Surgery

## 2020-05-29 NOTE — Telephone Encounter (Signed)
Patient contacted regarding no-show appointment. Therapy left patient a message advising her of her next visit.

## 2020-05-31 ENCOUNTER — Ambulatory Visit: Payer: Medicare HMO | Admitting: Physical Therapy

## 2020-05-31 NOTE — Telephone Encounter (Signed)
Err

## 2020-06-05 ENCOUNTER — Other Ambulatory Visit: Payer: Self-pay

## 2020-06-05 ENCOUNTER — Encounter: Payer: Self-pay | Admitting: Physical Therapy

## 2020-06-05 ENCOUNTER — Ambulatory Visit: Payer: Medicare HMO | Admitting: Physical Therapy

## 2020-06-05 DIAGNOSIS — M6281 Muscle weakness (generalized): Secondary | ICD-10-CM

## 2020-06-05 DIAGNOSIS — M25511 Pain in right shoulder: Secondary | ICD-10-CM | POA: Diagnosis not present

## 2020-06-05 DIAGNOSIS — M25611 Stiffness of right shoulder, not elsewhere classified: Secondary | ICD-10-CM

## 2020-06-05 NOTE — Therapy (Signed)
[place], [place], [address] Phone: [phone]   Fax:  [phone]  Physical Therapy Treatment  Patient Details  Name: Olivia Ewing MRN: [id number] Date of Birth: [birth year] Referring Provider (PT): Frankey Shown MD   Encounter Date: 06/05/2020   PT End of Session - 06/05/20 1307    Visit Number 9    Number of Visits 16    Date for PT Re-Evaluation 06/13/20    Authorization Type [employer] MCR/MCD    Authorization Time Period 06/05/20- submitting for retro auth to cover since eval    PT Start Time 1232    PT Stop Time 1315    PT Time Calculation (min) 43 min           Past Medical History:  Diagnosis Date  . Anemia   . Arthritis    knees hands  . Bipolar disorder ([place])   . Cervicalgia   . Depression   . Environmental allergies    cause SOB, uses inhaler for  . Environmental and seasonal allergies    uses inhaler prn  . Full dentures   . GERD (gastroesophageal reflux disease)    diet controlled - no meds  . Hyperlipidemia    diet controlled, no meds  . Hypertension   . Lumbar radiculopathy, chronic   . Right knee pain    posterior horn medial meniscal tear MRI 2013  . Spinal stenosis    getting epidural injections -last one 06/12/2015  . SVD (spontaneous vaginal delivery)    x 4    Past Surgical History:  Procedure Laterality Date  . APPENDECTOMY    . Bladder tack  10/2006   cystocoele  . BREAST SURGERY Right    benign cyst  . CARPAL TUNNEL RELEASE Left 10/25/2019   Procedure: LEFT CARPAL TUNNEL RELEASE;  Surgeon: Jessy Oto, MD;  Location: [place];  Service: Orthopedics;  Laterality: Left;  . COLONOSCOPY    . Hysterectomy other    . IRRIGATION AND DEBRIDEMENT SHOULDER Right 02/21/2020   Procedure: IRRIGATION AND DEBRIDEMENT RIGHT SHOULDER;  Surgeon: Leandrew Koyanagi, MD;  Location: [place];  Service: Orthopedics;  Laterality: Right;  . LUMBAR  LAMINECTOMY N/A 03/16/2019   Procedure: CENTRAL LAMINECTOMIES L2-3, L3-4 AND L4-5;  Surgeon: Jessy Oto, MD;  Location: [place];  Service: Orthopedics;  Laterality: N/A;  . TOTAL HIP ARTHROPLASTY Right 04/27/2018   Procedure: RIGHT TOTAL HIP ARTHROPLASTY ANTERIOR APPROACH;  Surgeon: Leandrew Koyanagi, MD;  Location: [place];  Service: Orthopedics;  Laterality: Right;  . TUBAL LIGATION      There were no vitals filed for this visit.   Subjective Assessment - 06/05/20 1232    Subjective The shoulder is coming along. I can reach up some but not all the way. I am back at work. I am having tingling in my fingers. I will tell Dr Louanne Skye.    Pertinent History Gout, Staph Aureus infection of shld. Cervicalgia.  Lumbar radiculopathy knee pain. R THA  2020, spinal stenosis ( wears back brace)    Currently in Pain? Yes    Pain Score 2     Pain Location Shoulder    Pain Orientation Right    Pain Descriptors / Indicators Aching    Pain Type Chronic pain    Aggravating Factors  work as a Scientist, water quality at [employer]    Pain Relieving Factors rest  [facility] PT Assessment - 06/05/20 0001      AROM   Right Shoulder Flexion 135 Degrees    Right Shoulder ABduction 90 Degrees   110 at end of session   Right Shoulder Internal Rotation --   T-12 reach   Right Shoulder External Rotation --   reach to T2     PROM   Right Shoulder ABduction 125 Degrees                         [facility] Adult PT Treatment/Exercise - 06/05/20 0001      Shoulder Exercises: Supine   Other Supine Exercises supine AROM scaption, no pain x 10      Shoulder Exercises: Sidelying   ABduction 15 reps;AROM   no pain     Shoulder Exercises: Standing   External Rotation Limitations 2x10 red    Internal Rotation Limitations 2x10 red    Extension 20 reps    Theraband Level (Shoulder Extension) Level 2 (Red)    Row Limitations red band x20      Shoulder Exercises: Pulleys   Flexion 2 minutes      Shoulder Exercises:  ROM/Strengthening   UBE (Upper Arm Bike) L2 2 minutes forward,2 min back      Manual Therapy   Manual therapy comments PROM all planes right shoulder    Joint Mobilization grade II and III inferior and posterior glides                       PT Long Term Goals - 06/05/20 1427      PT LONG TERM GOAL #1   Title Pt will be independent with advanced HEP    Time 8    Period Weeks    Status On-going      PT LONG TERM GOAL #2   Title Pt will improve shoulder strength to be able to lift at least 20 #s overhead to lift items in kitchen and household shelves    Time 8    Period Weeks    Status On-going      PT LONG TERM GOAL #3   Title Pt will be able to carry at least 20  lbs without pain up and down steps    Time 8    Period Weeks    Status On-going      PT LONG TERM GOAL #4   Title Pt will be able to dress herself without pain and without compensation    Baseline Pt performs dressing with compensatory movements    Time 8    Period Weeks    Status On-going      PT LONG TERM GOAL #5   Title Pt will improve FOTO intake  score to at least 55%    Baseline FOTO  47.6 %    Time 8    Period Weeks    Status On-going                 Plan - 06/05/20 1428    Clinical Impression Statement Pt is making good progress with ROM right shoulder but is still limted with AROM abduction. Able to perform abduction in sidelying through available ROM without pain. Reviewed theraband HEP and continued manual therapy to improved shoulder ROM. Pt tolerated session with min increases in pain. She has recently returned to work as a Scientist, water quality and notes some mild increases in pain with repetitive motions. At end of session her  AROM abduction was improved from 90 to 110 degrees.    PT Next Visit Plan check FOTO and work toward [place], re-eval next week.    PT Home Exercise Plan [id number]           Patient will benefit from skilled therapeutic intervention in order to improve the following  deficits and impairments:  Pain,Postural dysfunction,Improper body mechanics,Impaired UE functional use,Increased fascial restricitons,Increased muscle spasms,Impaired flexibility,Decreased range of motion,Decreased strength  Visit Diagnosis: Right shoulder pain, unspecified chronicity  Stiffness of right shoulder, not elsewhere classified  Muscle weakness (generalized)     Problem List Patient Active Problem List   Diagnosis Date Noted  . Immunization due 03/22/2020  . Septic arthritis ([place]) 02/25/2020  . Medication monitoring encounter 02/25/2020  . MSSA (methicillin susceptible Staphylococcus aureus) infection 02/24/2020  . Septic arthritis of right acromioclavicular joint ([place])   . Abscess of bursa of right shoulder 02/10/2020  . S/P arthroscopy of right shoulder 01/05/2020  . Carpal tunnel syndrome, left upper limb 10/25/2019    Class: Chronic  . Carpal tunnel syndrome, right upper limb 10/25/2019    Class: Chronic  . Nontraumatic incomplete tear of right rotator cuff 07/27/2019  . Tendinopathy of right biceps tendon 07/27/2019  . Breast cancer screening by mammogram 06/07/2019  . Seasonal allergic rhinitis due to pollen 06/07/2019  . Colon cancer screening 06/07/2019  . Spinal stenosis, lumbar region, with neurogenic claudication 03/16/2019    Class: Chronic  . Status post lumbar laminectomy 03/16/2019  . Acute stress reaction 09/25/2018  . Primary osteoarthritis of right hip 04/27/2018  . Status post total hip replacement, 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  . Shortness of breath 01/02/2018  . Chalazion left upper eyelid 02/20/2017  . Vitamin D insufficiency 11/25/2013  . Healthcare maintenance 11/25/2013  . Spinal stenosis of lumbar region 07/02/2013  . Arthritis, degenerative 11/17/2012  . Arthrosis of right acromioclavicular joint 06/05/2012  . Rectocele 04/28/2012  . Left knee DJD, degenerative meniscus tear 04/06/2012  . Numbness and tingling in hands 03/17/2012  .  Periodontitis 09/23/2011  . Depression 09/23/2011  . Right knee meniscal tear 03/14/2011  . Lumbar radiculopathy 03/14/2011  . Functional incontinence 10/18/2010  . NECK PAIN, CHRONIC 08/19/2008  . Iron deficiency anemia 05/12/2008  . SHOULDER PAIN, RIGHT 04/28/2008  . Hyperlipemia 06/18/2007  . Essential hypertension 05/20/2007    Dorene Ar, PTA 06/05/2020, 2:33 PM  [facility] [address] [place], [place], [address] Phone: [phone]   Fax:  [phone]  Name: KHALIA GONG MRN: [id number] Date of Birth: November 25, 1950

## 2020-06-07 ENCOUNTER — Ambulatory Visit: Payer: Medicare HMO | Admitting: Physical Therapy

## 2020-06-07 ENCOUNTER — Encounter: Payer: Self-pay | Admitting: Physical Therapy

## 2020-06-07 ENCOUNTER — Other Ambulatory Visit: Payer: Self-pay

## 2020-06-07 ENCOUNTER — Telehealth: Payer: Self-pay

## 2020-06-07 ENCOUNTER — Ambulatory Visit: Payer: Medicare HMO | Admitting: Behavioral Health

## 2020-06-07 DIAGNOSIS — M25511 Pain in right shoulder: Secondary | ICD-10-CM

## 2020-06-07 DIAGNOSIS — M6281 Muscle weakness (generalized): Secondary | ICD-10-CM

## 2020-06-07 DIAGNOSIS — M25611 Stiffness of right shoulder, not elsewhere classified: Secondary | ICD-10-CM

## 2020-06-07 NOTE — Therapy (Signed)
[facility] [address] [place], [place], [address] Phone: [phone]   Fax:  [phone]  Physical Therapy Treatment  Patient Details  Name: Olivia Ewing MRN: [id number] Date of Birth: 06/06/1950 Referring Provider (PT): Xu Naiping MD   Encounter Date: 06/07/2020   PT End of Session - 06/07/20 1410    Visit Number 10    Number of Visits 16    Date for PT Re-Evaluation 06/13/20    Authorization Type [employer] MCR/MCD    Authorization Time Period 06/05/20- submitting for retro auth to cover since eval    PT Start Time 0115    PT Stop Time 0156    PT Time Calculation (min) 41 min    Activity Tolerance Patient tolerated treatment well;Patient limited by pain    Behavior During Therapy WFL for tasks assessed/performed           Past Medical History:  Diagnosis Date  . Anemia   . Arthritis    knees hands  . Bipolar disorder (HCC)   . Cervicalgia   . Depression   . Environmental allergies    cause SOB, uses inhaler for  . Environmental and seasonal allergies    uses inhaler prn  . Full dentures   . GERD (gastroesophageal reflux disease)    diet controlled - no meds  . Hyperlipidemia    diet controlled, no meds  . Hypertension   . Lumbar radiculopathy, chronic   . Right knee pain    posterior horn medial meniscal tear MRI 2013  . Spinal stenosis    getting epidural injections -last one 06/12/2015  . SVD (spontaneous vaginal delivery)    x 4    Past Surgical History:  Procedure Laterality Date  . APPENDECTOMY    . Bladder tack  10/2006   cystocoele  . BREAST SURGERY Right    benign cyst  . CARPAL TUNNEL RELEASE Left 10/25/2019   Procedure: LEFT CARPAL TUNNEL RELEASE;  Surgeon: Nitka, James E, MD;  Location: [facility];  Service: Orthopedics;  Laterality: Left;  . COLONOSCOPY    . Hysterectomy other    . IRRIGATION AND DEBRIDEMENT SHOULDER Right 02/21/2020   Procedure: IRRIGATION AND  DEBRIDEMENT RIGHT SHOULDER;  Surgeon: Xu, Naiping M, MD;  Location: [facility];  Service: Orthopedics;  Laterality: Right;  . LUMBAR LAMINECTOMY N/A 03/16/2019   Procedure: CENTRAL LAMINECTOMIES L2-3, L3-4 AND L4-5;  Surgeon: Nitka, James E, MD;  Location: [facility];  Service: Orthopedics;  Laterality: N/A;  . TOTAL HIP ARTHROPLASTY Right 04/27/2018   Procedure: RIGHT TOTAL HIP ARTHROPLASTY ANTERIOR APPROACH;  Surgeon: Xu, Naiping M, MD;  Location: [facility];  Service: Orthopedics;  Laterality: Right;  . TUBAL LIGATION      There were no vitals filed for this visit.   Subjective Assessment - 06/07/20 1310    Subjective Pt reports she is doing well and is having no pain. She has been having tingling in her fingers when laying on her side and is going to tell her MD.    Pain Score 0-No pain    Pain Location Shoulder    Pain Orientation Right              [facility] PT Assessment - 06/07/20 0001      Observation/Other Assessments   Focus on Therapeutic Outcomes (FOTO)  80%                           [facility] Adult PT Treatment/Exercise - 06/07/20 0001      Shoulder Exercises: Supine   ABduction 10 reps;Both;Theraband    Theraband Level (Shoulder ABduction) Level 2 (Red)    Other Supine Exercises supine cane flexion x12      Shoulder Exercises: Standing   Extension 15 reps   2 sets   Theraband Level (Shoulder Extension) Level 2 (Red)    Row Limitations red band x20      Shoulder Exercises: Pulleys   Flexion 2 minutes    Scaption 2 minutes      Shoulder Exercises: ROM/Strengthening   UBE (Upper Arm Bike) L2 2.5 minutes forward,2.5 min back      Manual Therapy   Manual therapy comments PROM all planes right shoulder                       PT Long Term Goals - 06/07/20 1409      PT LONG TERM GOAL #1   Title Pt will be independent with advanced HEP    Time 8    Period Weeks    Status On-going      PT LONG TERM GOAL #2   Title Pt will improve  shoulder strength to be able to lift at least 20 #s overhead to lift items in kitchen and household shelves    Baseline Unable    Time 8    Period Weeks    Status On-going      PT LONG TERM GOAL #3   Title Pt will be able to carry at least 20  lbs without pain up and down steps    Baseline Unable    Time 8    Period Weeks    Status On-going      PT LONG TERM GOAL #4   Title Pt will be able to dress herself without pain and without compensation    Baseline Pt performs dressing with compensatory movements    Status On-going      PT LONG TERM GOAL #5   Title Pt will improve FOTO intake  score to at least 55%    Baseline FOTO 80%    Time 8    Period Weeks    Status Achieved                 Plan - 06/07/20 1357    Clinical Impression Statement Pt tolerated treatment well. She was able to perform all exercises with no pain and has met LTG #5 by increasing her FOTO score to 80%. PROM was performed to continue to increase her ROM. Pt reported feeling good following treatment.    PT Next Visit Plan work toward LTGs, re-eval next week.    PT Home Exercise Plan [id number]           Patient will benefit from skilled therapeutic intervention in order to improve the following deficits and impairments:  Pain,Postural dysfunction,Improper body mechanics,Impaired UE functional use,Increased fascial restricitons,Increased muscle spasms,Impaired flexibility,Decreased range of motion,Decreased strength  Visit Diagnosis: Right shoulder pain, unspecified chronicity  Stiffness of right shoulder, not elsewhere classified  Muscle weakness (generalized)     Problem List Patient Active Problem List   Diagnosis Date Noted  . Immunization due 03/22/2020  . Septic arthritis (HCC) 02/25/2020  . Medication monitoring encounter 02/25/2020  . MSSA (methicillin susceptible Staphylococcus aureus) infection 02/24/2020  . Septic arthritis of right acromioclavicular joint (HCC)   . Abscess of  bursa of right shoulder 02/10/2020  .   S/P arthroscopy of right shoulder 01/05/2020  . Carpal tunnel syndrome, left upper limb 10/25/2019    Class: Chronic  . Carpal tunnel syndrome, right upper limb 10/25/2019    Class: Chronic  . Nontraumatic incomplete tear of right rotator cuff 07/27/2019  . Tendinopathy of right biceps tendon 07/27/2019  . Breast cancer screening by mammogram 06/07/2019  . Seasonal allergic rhinitis due to pollen 06/07/2019  . Colon cancer screening 06/07/2019  . Spinal stenosis, lumbar region, with neurogenic claudication 03/16/2019    Class: Chronic  . Status post lumbar laminectomy 03/16/2019  . Acute stress reaction 09/25/2018  . Primary osteoarthritis of right hip 04/27/2018  . Status post total hip replacement, 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  . Shortness of breath 01/02/2018  . Chalazion left upper eyelid 02/20/2017  . Vitamin D insufficiency 11/25/2013  . Healthcare maintenance 11/25/2013  . Spinal stenosis of lumbar region 07/02/2013  . Arthritis, degenerative 11/17/2012  . Arthrosis of right acromioclavicular joint 06/05/2012  . Rectocele 04/28/2012  . Left knee DJD, degenerative meniscus tear 04/06/2012  . Numbness and tingling in hands 03/17/2012  . Periodontitis 09/23/2011  . Depression 09/23/2011  . Right knee meniscal tear 03/14/2011  . Lumbar radiculopathy 03/14/2011  . Functional incontinence 10/18/2010  . NECK PAIN, CHRONIC 08/19/2008  . Iron deficiency anemia 05/12/2008  . SHOULDER PAIN, RIGHT 04/28/2008  . Hyperlipemia 06/18/2007  . Essential hypertension 05/20/2007    Dawayne Cirri, SPTA 06/07/2020, 2:12 PM  [facility] [address] [place], [place], [address] Phone: [phone]   Fax:  [phone]  Name: MATILYN FEHRMAN MRN: [id number] Date of Birth: 31-Aug-1950

## 2020-06-07 NOTE — Telephone Encounter (Signed)
Requesting to speak with a nurse about getting refill on  sertraline (ZOLOFT) 50 MG tablet. Please call pt back.

## 2020-06-07 NOTE — Telephone Encounter (Signed)
Returned call to patient-states she is only sleeping about 2-3 hours at night.  Son currently being charged with murder.  Pt states she used to see a therapist in the past ([facility]) but it's "being demolished".  States "seroquel" is the only thing that helps her sleep and requests an rx.  No thoughts of self harm, just really stressed and can't sleep worrying about son. Pt scheduled for telehealth visit for tomorrow.  Will have MD place new referral for behavioral health if appropriate.Despina Hidden Cassady4/27/20224:29 PM

## 2020-06-08 ENCOUNTER — Encounter: Payer: Self-pay | Admitting: Internal Medicine

## 2020-06-08 ENCOUNTER — Ambulatory Visit (INDEPENDENT_AMBULATORY_CARE_PROVIDER_SITE_OTHER): Payer: Medicare HMO | Admitting: Internal Medicine

## 2020-06-08 DIAGNOSIS — G4700 Insomnia, unspecified: Secondary | ICD-10-CM | POA: Diagnosis not present

## 2020-06-08 DIAGNOSIS — F32A Depression, unspecified: Secondary | ICD-10-CM | POA: Diagnosis not present

## 2020-06-08 MED ORDER — QUETIAPINE FUMARATE 50 MG PO TABS
50.0000 mg | ORAL_TABLET | Freq: Every day | ORAL | 2 refills | Status: DC
Start: 2020-06-08 — End: 2020-09-13

## 2020-06-08 NOTE — Assessment & Plan Note (Signed)
Patient reports continued significant stressors over the past few years.  She states that she has had 4 surgeries since 2020 most recently her right shoulder septic arthritis irrigation and debridement.  States that when she was referred back to [facility] she had a unpleasant experience and was told they are not excepting new patients.  She states that she is been doing well on the sertraline 50 mg daily.  She does state in the past she was on Seroquel as well and this really helped manage both her sleep and depression.  She is requesting to be restarted on the Seroquel.  Previously tolerated these medications well.  Discussed the risks and side effects and symptoms to look out for.  Prior EKG show borderline QTC prolongation; will follow-up in 4 weeks to check an EKG.  Plan: Continue sertraline 50 mg daily Restart Seroquel 50 mg daily Follow-up in 4 weeks to check an EKG

## 2020-06-08 NOTE — Progress Notes (Signed)
  [facility] Internal Medicine Residency Telephone Encounter Continuity Care Appointment  HPI:   This telephone encounter was created for [name] on 06/08/2020 for the following purpose/cc depression and insomnia.   Past Medical History:  Past Medical History:  Diagnosis Date  . Anemia   . Arthritis    knees hands  . Bipolar disorder ([place])   . Cervicalgia   . Depression   . Environmental allergies    cause SOB, uses inhaler for  . Environmental and seasonal allergies    uses inhaler prn  . Full dentures   . GERD (gastroesophageal reflux disease)    diet controlled - no meds  . Hyperlipidemia    diet controlled, no meds  . Hypertension   . Lumbar radiculopathy, chronic   . Right knee pain    posterior horn medial meniscal tear MRI 2013  . Spinal stenosis    getting epidural injections -last one 06/12/2015  . SVD (spontaneous vaginal delivery)    x 4      ROS:  Review of Systems  Musculoskeletal: Positive for back pain and joint pain.  Neurological: Negative for dizziness, weakness and headaches.  Psychiatric/Behavioral: Positive for depression. Negative for suicidal ideas. The patient has insomnia.        Assessment / Plan / Recommendations:   Please see A&P under problem oriented charting for assessment of the patient's acute and chronic medical conditions.   As always, pt is advised that if symptoms worsen or new symptoms arise, they should go to an urgent care facility or to to ER for further evaluation.   Consent and Medical Decision Making:   Patient discussed with Dr. Evette Doffing  This is a telephone encounter between Olivia Ewing and [place] on 06/08/2020 for depression and insomnia. The visit was conducted with the patient located at home and Olivia Ewing at [facility]. The patient's identity was confirmed using their DOB and current address. The patient has consented to being evaluated through a telephone encounter and understands the  associated risks (an examination cannot be done and the patient may need to come in for an appointment) / benefits (allows the patient to remain at home, decreasing exposure to coronavirus). I personally spent 21 minutes on medical discussion.

## 2020-06-08 NOTE — Assessment & Plan Note (Signed)
Patient reports longstanding history of insomnia.  States that she has had difficulty falling asleep and staying asleep.  States that this has been ongoing and she has worked varying shifts both daytime and nighttime with her jobs.  States she typically can sleep 2 to 3 hours consecutively.  In the past pain kept her up however now she is completely pain-free.  She states that she has previously been counseled on good sleep hygiene.  She does not use much screen time prior to bed.  She tries to stop watching TV about 30 minutes to 1 hour before bedtime.  States her daughter introduced her to meditative music that she typically listens to before sleeping.  She states sleep hygiene has not helped her insomnia.  She also has a lot of life stressors going on at the current moment that are contributing to her insomnia and depression.  Encouraged to continue good sleep hygiene.  Also we will continue treating her depression in hopes of improving both her depression and insomnia  Plan: Continued to encourage good sleep hygiene Continue to treat her depression with sertraline and Seroquel Follow-up in 4 weeks

## 2020-06-09 NOTE — Progress Notes (Signed)
Internal Medicine Clinic Attending ° °Case discussed with Dr. Rehman  At the time of the visit.  We reviewed the resident’s history and exam and pertinent patient test results.  I agree with the assessment, diagnosis, and plan of care documented in the resident’s note.  ° °

## 2020-06-13 ENCOUNTER — Encounter: Payer: Self-pay | Admitting: *Deleted

## 2020-06-13 NOTE — Progress Notes (Signed)

## 2020-06-14 ENCOUNTER — Ambulatory Visit: Payer: Self-pay | Admitting: Physical Therapy

## 2020-06-15 ENCOUNTER — Ambulatory Visit: Payer: Medicare HMO | Attending: Orthopaedic Surgery

## 2020-06-15 ENCOUNTER — Other Ambulatory Visit: Payer: Self-pay

## 2020-06-15 DIAGNOSIS — M25611 Stiffness of right shoulder, not elsewhere classified: Secondary | ICD-10-CM | POA: Insufficient documentation

## 2020-06-15 DIAGNOSIS — M25511 Pain in right shoulder: Secondary | ICD-10-CM | POA: Diagnosis not present

## 2020-06-15 DIAGNOSIS — M6281 Muscle weakness (generalized): Secondary | ICD-10-CM | POA: Insufficient documentation

## 2020-06-15 NOTE — Therapy (Addendum)
[place], [place], [address] Phone: [phone]   Fax:  [phone]  Physical Therapy Treatment/Re-cert/Re-Auth  Patient Details  Name: Olivia Ewing MRN: [id number] Date of Birth: 1950-08-10 Referring Provider (PT): Frankey Shown MD   Encounter Date: 06/15/2020   PT End of Session - 06/15/20 2203    Visit Number 11    Number of Visits 19    Date for PT Re-Evaluation 07/22/20    Authorization Type [employer] MCR/MCD    Authorization Time Period re-auth submitted for 06/15/20-07/22/20 for 8 visits    PT Start Time 1622    PT Stop Time 1702    PT Time Calculation (min) 40 min    Activity Tolerance Patient tolerated treatment well;Patient limited by pain    Behavior During Therapy [facility] for tasks assessed/performed           Past Medical History:  Diagnosis Date  . Anemia   . Arthritis    knees hands  . Arthritis, degenerative 11/17/2012  . Bipolar disorder ([place])   . Cervicalgia   . Depression   . Environmental allergies    cause SOB, uses inhaler for  . Environmental and seasonal allergies    uses inhaler prn  . Full dentures   . GERD (gastroesophageal reflux disease)    diet controlled - no meds  . Hyperlipidemia    diet controlled, no meds  . Hypertension   . Lumbar radiculopathy, chronic   . Right knee pain    posterior horn medial meniscal tear MRI 2013  . Spinal stenosis    getting epidural injections -last one 06/12/2015  . SVD (spontaneous vaginal delivery)    x 4    Past Surgical History:  Procedure Laterality Date  . APPENDECTOMY    . Bladder tack  10/2006   cystocoele  . BREAST SURGERY Right    benign cyst  . CARPAL TUNNEL RELEASE Left 10/25/2019   Procedure: LEFT CARPAL TUNNEL RELEASE;  Surgeon: Jessy Oto, MD;  Location: [facility];  Service: Orthopedics;  Laterality: Left;  . COLONOSCOPY    . Hysterectomy other    . IRRIGATION AND DEBRIDEMENT SHOULDER Right  02/21/2020   Procedure: IRRIGATION AND DEBRIDEMENT RIGHT SHOULDER;  Surgeon: Leandrew Koyanagi, MD;  Location: [facility];  Service: Orthopedics;  Laterality: Right;  . LUMBAR LAMINECTOMY N/A 03/16/2019   Procedure: CENTRAL LAMINECTOMIES L2-3, L3-4 AND L4-5;  Surgeon: Jessy Oto, MD;  Location: [facility];  Service: Orthopedics;  Laterality: N/A;  . TOTAL HIP ARTHROPLASTY Right 04/27/2018   Procedure: RIGHT TOTAL HIP ARTHROPLASTY ANTERIOR APPROACH;  Surgeon: Leandrew Koyanagi, MD;  Location: [facility];  Service: Orthopedics;  Laterality: Right;  . TUBAL LIGATION      There were no vitals filed for this visit.   Subjective Assessment - 06/15/20 1628    Subjective No pain. 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    Pertinent History Gout, Staph Aureus infection of shld. Cervicalgia.  Lumbar radiculopathy knee pain. R THA  2020, spinal stenosis ( wears back brace)    Limitations Lifting    Diagnostic tests MRI shld    Currently in Pain? Yes    Pain Score 0-No pain    Pain Orientation Right  PT Education - 06/15/20 2202    Education Details Updated HEP    Person(s) Educated Patient    Methods Explanation;Demonstration;Tactile cues;Verbal cues;Handout    Comprehension Verbalized understanding;Returned demonstration;Verbal cues required;Tactile cues required               PT Long Term Goals - 06/15/20 2210      PT LONG TERM GOAL #1   Title Pt will be independent with advanced HEP    Status On-going    Target Date 07/22/20      PT LONG TERM GOAL #2   Title Pt will improve shoulder strength to be able to lift at least 20 #s overhead to lift items in kitchen and household shelves. 06/15/20:Revise to 8lbs to manage a gallon of milk    Baseline 1 lb    Status Revised    Target Date 07/22/20      PT LONG TERM GOAL #3   Title Pt will be able to carry at least 20  lbs without pain up  and down steps. Revised to 10lbs    Baseline Unable    Status Revised    Target Date 07/22/20      PT LONG TERM GOAL #4   Title Pt will be able to dress herself without pain and without compensation. 06/15/20: Minimal R shoulder shrug.    Baseline Pt performs dressing with compensatory movements    Status On-going    Target Date 07/22/20      PT LONG TERM GOAL #5   Title Pt will improve FOTO intake  score to at least 55%    Baseline FOTO 80%    Status Achieved    Target Date 06/13/20      Additional Long Term Goals   Additional Long Term Goals Yes      PT LONG TERM GOAL #6   Title Improve R shoulderstrength to 4 to 4+/5 strength for improved function use of the R shoulder/UE    Baseline 4- to 4/5    Status New    Target Date 07/22/20                 Plan - 06/15/20 2207    Clinical Impression Statement Pt is making good progress with the pain and function of her R shoulder. R shoulder ROMs and strength have all increased and pt has not experience R shoulder pain in a week. Strength at this time is the most significant deficit, especially with using the R UE with an extended elbow (a long lever) and reaching above her head and shoulders. Pt is pleased with the progress she is making. Pt will benefit from continued PT 2w2, 1w4 for R shoulder strengthening and ROM to optimize R UE function and to protect it against reinjury.    Personal Factors and Comorbidities Comorbidity 2    Comorbidities Gout, Staph Aureus infection of shld. Cervicalgia.  Lumbar radiculopathy knee pain. R THA  2020, spinal stenosis ( wears back brace) HTN  Spinal stenosis    Examination-Activity Limitations Reach Overhead;Dressing;Hygiene/Grooming;Lift;Carry;Caring for Others    Examination-Participation Restrictions Volunteer;Laundry    Stability/Clinical Decision Making Evolving/Moderate complexity    Clinical Decision Making Moderate    Rehab Potential Good    PT Frequency 2x / week    PT Duration 2  weeks   then 1 week 4   PT Treatment/Interventions ADLs/Self Care Home Management;Cryotherapy;Electrical Stimulation;Joint Manipulations;Dry needling;Taping;Passive range of motion;Manual techniques;Neuromuscular re-education;Patient/family education;Therapeutic exercise;Therapeutic activities;Iontophoresis 4mg /ml Dexamethasone;Ultrasound;Moist Heat    PT  Next Visit Plan Assess response to today's session    PT Home Exercise Plan [id number]    Consulted and Agree with Plan of Care Patient           Patient will benefit from skilled therapeutic intervention in order to improve the following deficits and impairments:  Pain,Postural dysfunction,Improper body mechanics,Impaired UE functional use,Increased fascial restricitons,Increased muscle spasms,Impaired flexibility,Decreased range of motion,Decreased strength  Visit Diagnosis: Right shoulder pain, unspecified chronicity - Plan: PT plan of care cert/re-cert  Stiffness of right shoulder, not elsewhere classified - Plan: PT plan of care cert/re-cert  Muscle weakness (generalized) - Plan: PT plan of care cert/re-cert     Problem List Patient Active Problem List   Diagnosis Date Noted  . Insomnia 06/08/2020  . Immunization due 03/22/2020  . Septic arthritis ([place]) 02/25/2020  . Medication monitoring encounter 02/25/2020  . MSSA (methicillin susceptible Staphylococcus aureus) infection 02/24/2020  . Septic arthritis of right acromioclavicular joint ([place])   . Abscess of bursa of right shoulder 02/10/2020  . S/P arthroscopy of right shoulder 01/05/2020  . Carpal tunnel syndrome, left upper limb 10/25/2019    Class: Chronic  . Carpal tunnel syndrome, right upper limb 10/25/2019    Class: Chronic  . Nontraumatic incomplete tear of right rotator cuff 07/27/2019  . Tendinopathy of right biceps tendon 07/27/2019  . Breast cancer screening by mammogram 06/07/2019  . Seasonal allergic rhinitis due to pollen 06/07/2019  . Colon cancer  screening 06/07/2019  . Spinal stenosis, lumbar region, with neurogenic claudication 03/16/2019    Class: Chronic  . Status post lumbar laminectomy 03/16/2019  . Acute stress reaction 09/25/2018  . Primary osteoarthritis of right hip 04/27/2018  . Status post total hip replacement, 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  . Shortness of breath 01/02/2018  . Chalazion left upper eyelid 02/20/2017  . Vitamin D insufficiency 11/25/2013  . Healthcare maintenance 11/25/2013  . Arthrosis of right acromioclavicular joint 06/05/2012  . Rectocele 04/28/2012  . Left knee DJD, degenerative meniscus tear 04/06/2012  . Numbness and tingling in hands 03/17/2012  . Periodontitis 09/23/2011  . Depression 09/23/2011  . Right knee meniscal tear 03/14/2011  . Lumbar radiculopathy 03/14/2011  . Functional incontinence 10/18/2010  . NECK PAIN, CHRONIC 08/19/2008  . Iron deficiency anemia 05/12/2008  . SHOULDER PAIN, RIGHT 04/28/2008  . Hyperlipemia 06/18/2007  . Essential hypertension 05/20/2007    Gar Ponto MS, PT 06/16/20 8:50 AM  [facility] [address] [place], [place], [address] Phone: [phone]   Fax:  [phone]  Name: Olivia Ewing MRN: [id number] Date of Birth: [birth year]   Referring diagnosis? R shoulder pain, stiffness, and weakness Treatment diagnosis? (if different than referring diagnosis) Same What was this (referring dx) caused by? R shoulder arthroscopy [x]  Surgery []  Fall []  Ongoing issue []  Arthritis []  Other: ____________  Laterality: [x]  Rt []  Lt []  Both  Check all possible CPT codes:      [x]  97110 (Therapeutic Exercise)  []  92507 (SLP Treatment)  []  97112 (Neuro Re-ed)   []  92526 (Swallowing Treatment)   []  97116 (Gait Training)   []  [id number] (Cognitive Training, 1st 15 minutes) [x]  97140 (Manual Therapy)   []  97130 (Cognitive Training, each add'l 15 minutes)  [x]  97530 (Therapeutic Activities)  []  Other, List CPT Code  ____________    [x]  82423 (Self Care)       []  All codes above (97110 - 97535)  []  97012 (Mechanical Traction)  [x]  97014 (E-stim Unattended)  [x]  97032 (E-stim  manual)  [x]  97033 ([place])  [x]  60454 (Ultrasound)  []  09811 Therapist, art) []  [id number] (Physical Performance Training) []  [id number] (Aquatic Therapy) []  [id number] (Contrast Bath) []  [id number] (Paraffin) []  97597 (Wound Care 1st 20 sq cm) []  97598 (Wound Care each add'l 20 sq cm) []  97016 (Vasopneumatic Device) []  [phone] Comptroller) []  [phone] (Prosthetic Training)

## 2020-06-16 ENCOUNTER — Encounter: Payer: Self-pay | Admitting: Physical Therapy

## 2020-06-16 ENCOUNTER — Ambulatory Visit: Payer: Medicare HMO | Admitting: Physical Therapy

## 2020-06-19 ENCOUNTER — Other Ambulatory Visit: Payer: Self-pay | Admitting: Internal Medicine

## 2020-06-19 ENCOUNTER — Other Ambulatory Visit: Payer: Self-pay

## 2020-06-19 ENCOUNTER — Ambulatory Visit: Payer: Medicare HMO | Admitting: Physical Therapy

## 2020-06-19 DIAGNOSIS — M6281 Muscle weakness (generalized): Secondary | ICD-10-CM

## 2020-06-19 DIAGNOSIS — E782 Mixed hyperlipidemia: Secondary | ICD-10-CM

## 2020-06-19 DIAGNOSIS — M25611 Stiffness of right shoulder, not elsewhere classified: Secondary | ICD-10-CM

## 2020-06-19 DIAGNOSIS — M25511 Pain in right shoulder: Secondary | ICD-10-CM

## 2020-06-19 DIAGNOSIS — D508 Other iron deficiency anemias: Secondary | ICD-10-CM

## 2020-06-19 NOTE — Progress Notes (Signed)
Things That May Be Affecting Your Health:  Alcohol  Hearing loss x Pain   x Depression  Home Safety  Sexual Health   Diabetes  Lack of physical activity  Stress   Difficulty with daily activities  Loneliness  Tiredness   Drug use  Medicines x Tobacco use   Falls  Motor Vehicle Safety  Weight   Food choices  Oral Health  Other    YOUR PERSONALIZED HEALTH PLAN : 1. Schedule your next subsequent Medicare Wellness visit in one year 2. Attend all of your regular appointments to address your medical issues 3. Complete the preventative screenings and services   Annual Wellness Visit   Medicare Covered Preventative Screenings and [place] Men and Women Who How Often Need? Date of Last Service Action  Abdominal Aortic Aneurysm Adults with AAA risk factors Once      Alcohol Misuse and Counseling All Adults Screening once a year if no alcohol misuse. Counseling up to 4 face to face sessions.     Bone Density Measurement  Adults at risk for osteoporosis Once every 2 yrs      Lipid Panel Z13.6 All adults without CV disease Once every 5 yrs       Colorectal Cancer   Stool sample or  Colonoscopy All adults 28 and older   Once every year  Every 10 years x       Depression All Adults Once a year  Today   Diabetes Screening Blood glucose, post glucose load, or GTT Z13.1  All adults at risk  Pre-diabetics  Once per year  Twice per year      Diabetes  Self-Management Training All adults Diabetics 10 hrs first year; 2 hours subsequent years. Requires Copay     Glaucoma  Diabetics  Family history of glaucoma  African Americans 88 yrs +  Hispanic Americans 49 yrs + Annually - requires coppay      Hepatitis C Z72.89 or F19.20  High Risk for HCV  Born between 1945 and 1965  Annually  Once      HIV Z11.4 All adults based on risk  Annually btw ages 15 & 97 regardless of risk  Annually > 65 yrs if at increased risk      Lung Cancer Screening  Asymptomatic adults aged 62-77 with 30 pack yr history and current smoker OR quit within the last 15 yrs Annually Must have counseling and shared decision making documentation before first screen      Medical Nutrition Therapy Adults with   Diabetes  Renal disease  Kidney transplant within past 3 yrs 3 hours first year; 2 hours subsequent years     Obesity and Counseling All adults Screening once a year Counseling if BMI 30 or higher  Today   Tobacco Use Counseling Adults who use tobacco  Up to 8 visits in one year     Vaccines Z23  Hepatitis B  Influenza   Pneumonia  Adults   Once  Once every flu season  Two different vaccines separated by one year x    Next Annual Wellness Visit People with Medicare Every year  Today     Services & Screenings Women Who How Often Need  Date of Last Service Action  Mammogram  Z12.31 Women over 20 One baseline ages 56-39. Annually ager 40 yrs+ x     Pap tests All women Annually if high risk. Every 2 yrs for normal risk women  Screening for cervical cancer with   Pap (Z01.419 nl or Z01.411abnl) &  HPV Z11.51 Women aged 68 to 50 Once every 5 yrs     Screening pelvic and breast exams All women Annually if high risk. Every 2 yrs for normal risk women     Sexually Transmitted Diseases  Chlamydia  Gonorrhea  Syphilis All at risk adults Annually for non pregnant females at increased risk         [place] Men Who How Ofter Need  Date of Last Service Action  Prostate Cancer - DRE & PSA Men over 50 Annually.  DRE might require a copay.        Sexually Transmitted Diseases  Syphilis All at risk adults Annually for men at increased risk      Health Maintenance List Health Maintenance  Topic Date Due  . COVID-19 Vaccine (1) Never done  . MAMMOGRAM  01/10/2013  . TETANUS/TDAP  08/20/2018  . PNA vac Low Risk Adult (2 of 2 - PPSV23) 01/16/2019  . COLONOSCOPY (Pts 45-66yrs Insurance coverage will need to be  confirmed)  03/02/2019  . INFLUENZA VACCINE  09/11/2020  . DEXA SCAN  Completed  . Hepatitis C Screening  Completed  . HPV VACCINES  Aged Out

## 2020-06-19 NOTE — Therapy (Signed)
[place], [place], [address] Phone: [phone]   Fax:  [phone]  Physical Therapy Treatment  Patient Details  Name: Olivia Ewing MRN: [id number] Date of Birth: 1951/01/01 Referring Provider (PT): Frankey Shown MD   Encounter Date: 06/19/2020   PT End of Session - 06/19/20 1541    Visit Number 12    Number of Visits 19    Date for PT Re-Evaluation 07/22/20    Authorization Type [employer] MCR/MCD    Authorization Time Period re-auth submitted for 06/15/20-07/22/20 for 8 visits    PT Start Time 1500    PT Stop Time 1545    PT Time Calculation (min) 45 min    Activity Tolerance Patient tolerated treatment well;Patient limited by pain    Behavior During Therapy [facility] for tasks assessed/performed           Past Medical History:  Diagnosis Date  . Anemia   . Arthritis    knees hands  . Arthritis, degenerative 11/17/2012  . Bipolar disorder ([place])   . Cervicalgia   . Depression   . Environmental allergies    cause SOB, uses inhaler for  . Environmental and seasonal allergies    uses inhaler prn  . Full dentures   . GERD (gastroesophageal reflux disease)    diet controlled - no meds  . Hyperlipidemia    diet controlled, no meds  . Hypertension   . Lumbar radiculopathy, chronic   . Right knee pain    posterior horn medial meniscal tear MRI 2013  . Spinal stenosis    getting epidural injections -last one 06/12/2015  . SVD (spontaneous vaginal delivery)    x 4    Past Surgical History:  Procedure Laterality Date  . APPENDECTOMY    . Bladder tack  10/2006   cystocoele  . BREAST SURGERY Right    benign cyst  . CARPAL TUNNEL RELEASE Left 10/25/2019   Procedure: LEFT CARPAL TUNNEL RELEASE;  Surgeon: Jessy Oto, MD;  Location: [facility];  Service: Orthopedics;  Laterality: Left;  . COLONOSCOPY    . Hysterectomy other    . IRRIGATION AND DEBRIDEMENT SHOULDER Right 02/21/2020    Procedure: IRRIGATION AND DEBRIDEMENT RIGHT SHOULDER;  Surgeon: Leandrew Koyanagi, MD;  Location: [facility];  Service: Orthopedics;  Laterality: Right;  . LUMBAR LAMINECTOMY N/A 03/16/2019   Procedure: CENTRAL LAMINECTOMIES L2-3, L3-4 AND L4-5;  Surgeon: Jessy Oto, MD;  Location: [facility];  Service: Orthopedics;  Laterality: N/A;  . TOTAL HIP ARTHROPLASTY Right 04/27/2018   Procedure: RIGHT TOTAL HIP ARTHROPLASTY ANTERIOR APPROACH;  Surgeon: Leandrew Koyanagi, MD;  Location: [facility];  Service: Orthopedics;  Laterality: Right;  . TUBAL LIGATION      There were no vitals filed for this visit.   Subjective Assessment - 06/19/20 1502    Subjective Pt states she can do all of her ADLs and IADLs with no pain. Pretty reports just a little tiny twinge when pulling clothes out.    Pertinent History Gout, Staph Aureus infection of shld. Cervicalgia.  Lumbar radiculopathy knee pain. R THA  2020, spinal stenosis ( wears back brace)    Limitations Lifting    How long can you sit comfortably? unlimited    How long can you stand comfortably? no issues today    How long can you walk comfortably? before my shld I used to walk 2 x a day 1-2 miles  Diagnostic tests MRI shld    Currently in Pain? No/denies                             [facility] Adult PT Treatment/Exercise - 06/19/20 0001      Shoulder Exercises: Standing   External Rotation Right;15 reps    Theraband Level (Shoulder External Rotation) Level 1 (Yellow)    Flexion Strengthening;Right;10 reps;Theraband    Theraband Level (Shoulder Flexion) Level 1 (Yellow)    ABduction Strengthening;Right;10 reps;AROM   no tband   Other Standing Exercises bicep curl 2x10 yellow tband; tricep 2x10 red tband      Shoulder Exercises: ROM/Strengthening   UBE (Upper Arm Bike) L2 2.5 minutes forward,2.5 min back    Other ROM/Strengthening Exercises Flexion on wall 5x, 10 sec; Abd on wall, thumb up, 5x, 10 sec      Modalities   Modalities  Vasopneumatic      Vasopneumatic   Number Minutes Vasopneumatic  10 minutes    Vasopnuematic Location  Shoulder    Vasopneumatic Pressure Medium    Vasopneumatic Temperature  34                       PT Long Term Goals - 06/15/20 2210      PT LONG TERM GOAL #1   Title Pt will be independent with advanced HEP    Status On-going    Target Date 07/22/20      PT LONG TERM GOAL #2   Title Pt will improve shoulder strength to be able to lift at least 20 #s overhead to lift items in kitchen and household shelves. 06/15/20:Revise to 8lbs to manage a gallon of milk    Baseline 1 lb    Status Revised    Target Date 07/22/20      PT LONG TERM GOAL #3   Title Pt will be able to carry at least 20  lbs without pain up and down steps. Revised to 10lbs    Baseline Unable    Status Revised    Target Date 07/22/20      PT LONG TERM GOAL #4   Title Pt will be able to dress herself without pain and without compensation. 06/15/20: Minimal R shoulder shrug.    Baseline Pt performs dressing with compensatory movements    Status On-going    Target Date 07/22/20      PT LONG TERM GOAL #5   Title Pt will improve FOTO intake  score to at least 55%    Baseline FOTO 80%    Status Achieved    Target Date 06/13/20      Additional Long Term Goals   Additional Long Term Goals Yes      PT LONG TERM GOAL #6   Title Improve R shoulderstrength to 4 to 4+/5 strength for improved function use of the R shoulder/UE    Baseline 4- to 4/5    Status New    Target Date 07/22/20                 Plan - 06/19/20 1524    Clinical Impression Statement Treatment focused primarily on strengthening and AROM for pt's shoulder. Initiated bicep and tricep strengthening. Some continued AROM deficits with shoulder abduction.    Personal Factors and Comorbidities Comorbidity 2    Comorbidities Gout, Staph Aureus infection of shld. Cervicalgia.  Lumbar radiculopathy knee pain. R THA  2020, spinal  stenosis  ( wears back brace) HTN  Spinal stenosis    Examination-Activity Limitations Reach Overhead;Dressing;Hygiene/Grooming;Lift;Carry;Caring for Others    Examination-Participation Restrictions Volunteer;Laundry    Stability/Clinical Decision Making Evolving/Moderate complexity    Rehab Potential Good    PT Frequency 2x / week    PT Duration 2 weeks   then 1 week 4   PT Treatment/Interventions ADLs/Self Care Home Management;Cryotherapy;Electrical Stimulation;Joint Manipulations;Dry needling;Taping;Passive range of motion;Manual techniques;Neuromuscular re-education;Patient/family education;Therapeutic exercise;Therapeutic activities;Iontophoresis 4mg /ml Dexamethasone;Ultrasound;Moist Heat    PT Next Visit Plan Assess response to today's session. Continue gentle shoulder strengthening as able. Continue to work on shoulder abduction AAROM (consider sidelying). Progress bicep and tricep strengthening.    PT Home Exercise Plan [id number]    Consulted and Agree with Plan of Care Patient           Patient will benefit from skilled therapeutic intervention in order to improve the following deficits and impairments:  Pain,Postural dysfunction,Improper body mechanics,Impaired UE functional use,Increased fascial restricitons,Increased muscle spasms,Impaired flexibility,Decreased range of motion,Decreased strength  Visit Diagnosis: Right shoulder pain, unspecified chronicity  Stiffness of right shoulder, not elsewhere classified  Muscle weakness (generalized)     Problem List Patient Active Problem List   Diagnosis Date Noted  . Insomnia 06/08/2020  . Immunization due 03/22/2020  . Septic arthritis ([place]) 02/25/2020  . Medication monitoring encounter 02/25/2020  . MSSA (methicillin susceptible Staphylococcus aureus) infection 02/24/2020  . Septic arthritis of right acromioclavicular joint ([place])   . Abscess of bursa of right shoulder 02/10/2020  . S/P arthroscopy of right shoulder 01/05/2020  .  Carpal tunnel syndrome, left upper limb 10/25/2019    Class: Chronic  . Carpal tunnel syndrome, right upper limb 10/25/2019    Class: Chronic  . Nontraumatic incomplete tear of right rotator cuff 07/27/2019  . Tendinopathy of right biceps tendon 07/27/2019  . Breast cancer screening by mammogram 06/07/2019  . Seasonal allergic rhinitis due to pollen 06/07/2019  . Colon cancer screening 06/07/2019  . Spinal stenosis, lumbar region, with neurogenic claudication 03/16/2019    Class: Chronic  . Status post lumbar laminectomy 03/16/2019  . Acute stress reaction 09/25/2018  . Primary osteoarthritis of right hip 04/27/2018  . Status post total hip replacement, 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  . Shortness of breath 01/02/2018  . Chalazion left upper eyelid 02/20/2017  . Vitamin D insufficiency 11/25/2013  . Healthcare maintenance 11/25/2013  . Arthrosis of right acromioclavicular joint 06/05/2012  . Rectocele 04/28/2012  . Left knee DJD, degenerative meniscus tear 04/06/2012  . Numbness and tingling in hands 03/17/2012  . Periodontitis 09/23/2011  . Depression 09/23/2011  . Right knee meniscal tear 03/14/2011  . Lumbar radiculopathy 03/14/2011  . Functional incontinence 10/18/2010  . NECK PAIN, CHRONIC 08/19/2008  . Iron deficiency anemia 05/12/2008  . SHOULDER PAIN, RIGHT 04/28/2008  . Hyperlipemia 06/18/2007  . Essential hypertension 05/20/2007    [facility] April Ma L Tierra Thoma PT, DPT 06/19/2020, 3:42 PM  [facility] [address] [place], [place], [address] Phone: [phone]   Fax:  [phone]  Name: TALINE NASS MRN: [id number] Date of Birth: 1950-08-17

## 2020-06-20 ENCOUNTER — Encounter: Payer: Self-pay | Admitting: Orthopaedic Surgery

## 2020-06-20 ENCOUNTER — Other Ambulatory Visit: Payer: Self-pay | Admitting: Student

## 2020-06-20 ENCOUNTER — Encounter: Payer: Self-pay | Admitting: Physical Therapy

## 2020-06-20 ENCOUNTER — Other Ambulatory Visit: Payer: Self-pay | Admitting: Radiology

## 2020-06-20 MED ORDER — DICLOFENAC SODIUM 75 MG PO TBEC
75.0000 mg | DELAYED_RELEASE_TABLET | Freq: Two times a day (BID) | ORAL | 2 refills | Status: DC
Start: 2020-06-20 — End: 2020-10-23

## 2020-06-21 ENCOUNTER — Telehealth: Payer: Self-pay

## 2020-06-21 ENCOUNTER — Ambulatory Visit: Payer: Medicare HMO | Admitting: Physical Therapy

## 2020-06-21 NOTE — Telephone Encounter (Signed)
Called patient to schedule a appointment with  Dr.Xu for her shoulder but her mailbox is full call [phone]

## 2020-06-22 ENCOUNTER — Other Ambulatory Visit: Payer: Self-pay

## 2020-06-22 ENCOUNTER — Ambulatory Visit (INDEPENDENT_AMBULATORY_CARE_PROVIDER_SITE_OTHER): Payer: Medicare HMO | Admitting: Specialist

## 2020-06-22 ENCOUNTER — Encounter: Payer: Self-pay | Admitting: Specialist

## 2020-06-22 VITALS — BP 165/91 | HR 75 | Ht 65.0 in | Wt 180.0 lb

## 2020-06-22 DIAGNOSIS — G5603 Carpal tunnel syndrome, bilateral upper limbs: Secondary | ICD-10-CM

## 2020-06-22 DIAGNOSIS — Z9889 Other specified postprocedural states: Secondary | ICD-10-CM

## 2020-06-22 MED ORDER — HYDROCODONE-ACETAMINOPHEN 5-325 MG PO TABS: 1.0000 | ORAL_TABLET | Freq: Four times a day (QID) | ORAL | 0 refills | Status: DC | PRN

## 2020-06-22 NOTE — Progress Notes (Signed)
Office Visit Note   Patient: Olivia Ewing           Date of Birth: 07-14-1950           MRN: [id number] Visit Date: 06/22/2020              Requested by: Olivia Mulligan, MD [address] [place],  [place] [address] PCP: Olivia Mulligan, MD   Assessment & Plan: Visit Diagnoses:  1. Bilateral carpal tunnel syndrome   2. S/P carpal tunnel release     Plan: May use the wrist splints as needed. Our office will contact you to schedule for a right open carpal tunnel release. [name] is the surgery scheduler and she will get approval from your insurance and call you. Risk of surgery includes risk infection 1:200, bleeding is not usual, 1 in 1,000,000 risk of blood loss that is significant. Risk to the nerve is 1:30,000.  Carpal Tunnel Syndrome  Carpal tunnel syndrome is a condition that causes pain in your hand and arm. The carpal tunnel is a narrow area located on the palm side of your wrist. Repeated wrist motion or certain diseases may cause swelling within the tunnel. This swelling pinches the main nerve in the wrist (median nerve). What are the causes? This condition may be caused by:  Repeated wrist motions.  Wrist injuries.  Arthritis.  A cyst or tumor in the carpal tunnel.  Fluid buildup during pregnancy. Sometimes the cause of this condition is not known. What increases the risk? This condition is more likely to develop in:  People who have jobs that cause them to repeatedly move their wrists in the same motion, such as Art gallery manager.  Women.  People with certain conditions, such as: ? Diabetes. ? Obesity. ? An underactive thyroid (hypothyroidism). ? Kidney failure. What are the signs or symptoms? Symptoms of this condition include:  A tingling feeling in your fingers, especially in your thumb, index, and middle fingers.  Tingling or numbness in your hand.  An aching feeling in your entire arm, especially when your wrist and elbow are  bent for long periods of time.  Wrist pain that goes up your arm to your shoulder.  Pain that goes down into your palm or fingers.  A weak feeling in your hands. You may have trouble grabbing and holding items. Your symptoms may feel worse during the night. How is this diagnosed? This condition is diagnosed with a medical history and physical exam. You may also have tests, including:  An electromyogram (EMG). This test measures electrical signals sent by your nerves into the muscles.  X-rays. How is this treated? Treatment for this condition includes:  Lifestyle changes. It is important to stop doing or modify the activity that caused your condition.  Physical or occupational therapy.  Medicines for pain and inflammation. This may include medicine that is injected into your wrist.  A wrist splint.  Surgery. Follow these instructions at home: If you have a splint:   Wear it as told by your health care provider. Remove it only as told by your health care provider.  Loosen the splint if your fingers become numb and tingle, or if they turn cold and blue.  Keep the splint clean and dry. General instructions   Take over-the-counter and prescription medicines only as told by your health care provider.  Rest your wrist from any activity that may be causing your pain. If your condition is work related, talk to your employer  about changes that can be made, such as getting a wrist pad to use while typing.  If directed, apply ice to the painful area: ? Put ice in a plastic bag. ? Place a towel between your skin and the bag. ? Leave the ice on for 20 minutes, 2-3 times per day.  Keep all follow-up visits as told by your health care provider. This is important.  Do any exercises as told by your health care provider, physical therapist, or occupational therapist. Contact a health care provider if:  You have new symptoms.  Your pain is not controlled with medicines.  Your  symptoms get worse. This information is not intended to replace advice given to you by your health care provider. Make sure you discuss any questions you have with your health care provider. Document Released: 01/26/2000 Document Revised: 06/08/2015 Document Reviewed: 10/09/2016 Elsevier Interactive Patient Education  2017 [employer] Instructions: Return in about 4 weeks (around 07/20/2020).   Orders:  No orders of the defined types were placed in this encounter.  No orders of the defined types were placed in this encounter.     Procedures: No procedures performed   Clinical Data: No additional findings.   Subjective: Chief Complaint  Patient presents with  . Left Hand - Numbness    70 year old right handed female with history of right shoulder arthroscopy by Dr. Erlinda Hong in 2021 and this was complicated by a post op infection. Underwent a further incision and drainage and debridement and irrigation . She had a PICCU line placed and IV antibiotics. Now she is undergoing PT and OT for the right shoulder 2 times weekly. Last year she had left open carpal tunnel Release for severe CTS, preop studies showed right CTS that was moderately severe but she did not feel that she needed surgery. Now the pain and numbness and paresthesias are worse into the Right hand and she has night pain and awakens with numbness and paresthesias. Numbness is becoming more constant.    Review of Systems  Constitutional: Negative.   HENT: Negative.   Eyes: Negative.   Respiratory: Negative.   Cardiovascular: Negative.   Gastrointestinal: Negative.   Endocrine: Negative.   Genitourinary: Negative.   Musculoskeletal: Negative.   Skin: Negative.   Allergic/Immunologic: Negative.   Neurological: Negative.   Hematological: Negative.   Psychiatric/Behavioral: Negative.      Objective: Vital Signs: BP (!) 165/91 (BP Location: Left Arm, Patient Position: Sitting)   Pulse 75   Ht 5\' 5"   (1.651 m)   Wt 180 lb (81.6 kg)   BMI 29.95 kg/m   Physical Exam Constitutional:      Appearance: She is well-developed.  HENT:     Head: Normocephalic and atraumatic.  Eyes:     Pupils: Pupils are equal, round, and reactive to light.  Pulmonary:     Effort: Pulmonary effort is normal.     Breath sounds: Normal breath sounds.  Abdominal:     General: Bowel sounds are normal.     Palpations: Abdomen is soft.  Musculoskeletal:        General: Normal range of motion.     Cervical back: Normal range of motion and neck supple.  Skin:    General: Skin is warm and dry.  Neurological:     Mental Status: She is alert and oriented to person, place, and time.  Psychiatric:        Behavior: Behavior normal.  Thought Content: Thought content normal.        Judgment: Judgment normal.     Right Hand Exam   Muscle Strength  Wrist extension: 5/5  Wrist flexion: 5/5  Grip: 4/5   Tests  Phalen's sign: positive Tinel's sign (median nerve): positive  Other  Erythema: absent Scars: absent   Left Hand Exam   Tests  Phalen's Sign: negative Tinel's sign (median nerve): negative  Other  Erythema: absent Scars: present  Comments:  No redness or eythrema      Specialty Comments:  No specialty comments available.  Imaging: No results found.   PMFS History: Patient Active Problem List   Diagnosis Date Noted  . Carpal tunnel syndrome, left upper limb 10/25/2019    Priority: High    Class: Chronic  . Spinal stenosis, lumbar region, with neurogenic claudication 03/16/2019    Priority: High    Class: Chronic  . Carpal tunnel syndrome, right upper limb 10/25/2019    Priority: Low    Class: Chronic  . Insomnia 06/08/2020  . Immunization due 03/22/2020  . Septic arthritis ([place]) 02/25/2020  . Medication monitoring encounter 02/25/2020  . MSSA (methicillin susceptible Staphylococcus aureus) infection 02/24/2020  . Septic arthritis of right acromioclavicular  joint ([place])   . Abscess of bursa of right shoulder 02/10/2020  . S/P arthroscopy of right shoulder 01/05/2020  . Nontraumatic incomplete tear of right rotator cuff 07/27/2019  . Tendinopathy of right biceps tendon 07/27/2019  . Breast cancer screening by mammogram 06/07/2019  . Seasonal allergic rhinitis due to pollen 06/07/2019  . Colon cancer screening 06/07/2019  . Status post lumbar laminectomy 03/16/2019  . Acute stress reaction 09/25/2018  . Primary osteoarthritis of right hip 04/27/2018  . Status post total hip replacement, 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  . Shortness of breath 01/02/2018  . Chalazion left upper eyelid 02/20/2017  . Vitamin D insufficiency 11/25/2013  . Healthcare maintenance 11/25/2013  . Arthrosis of right acromioclavicular joint 06/05/2012  . Rectocele 04/28/2012  . Left knee DJD, degenerative meniscus tear 04/06/2012  . Numbness and tingling in hands 03/17/2012  . Periodontitis 09/23/2011  . Depression 09/23/2011  . Right knee meniscal tear 03/14/2011  . Lumbar radiculopathy 03/14/2011  . Functional incontinence 10/18/2010  . NECK PAIN, CHRONIC 08/19/2008  . Iron deficiency anemia 05/12/2008  . SHOULDER PAIN, RIGHT 04/28/2008  . Hyperlipemia 06/18/2007  . Essential hypertension 05/20/2007   Past Medical History:  Diagnosis Date  . Anemia   . Arthritis    knees hands  . Arthritis, degenerative 11/17/2012  . Bipolar disorder ([place])   . Cervicalgia   . Depression   . Environmental allergies    cause SOB, uses inhaler for  . Environmental and seasonal allergies    uses inhaler prn  . Full dentures   . GERD (gastroesophageal reflux disease)    diet controlled - no meds  . Hyperlipidemia    diet controlled, no meds  . Hypertension   . Lumbar radiculopathy, chronic   . Right knee pain    posterior horn medial meniscal tear MRI 2013  . Spinal stenosis    getting epidural injections -last one 06/12/2015  . SVD (spontaneous vaginal delivery)    x 4    Family  History  Problem Relation Age of Onset  . Hypertension Mother     Past Surgical History:  Procedure Laterality Date  . APPENDECTOMY    . Bladder tack  10/2006   cystocoele  . BREAST SURGERY Right    benign  cyst  . CARPAL TUNNEL RELEASE Left 10/25/2019   Procedure: LEFT CARPAL TUNNEL RELEASE;  Surgeon: Jessy Oto, MD;  Location: [facility];  Service: Orthopedics;  Laterality: Left;  . COLONOSCOPY    . Hysterectomy other    . IRRIGATION AND DEBRIDEMENT SHOULDER Right 02/21/2020   Procedure: IRRIGATION AND DEBRIDEMENT RIGHT SHOULDER;  Surgeon: Leandrew Koyanagi, MD;  Location: [facility];  Service: Orthopedics;  Laterality: Right;  . LUMBAR LAMINECTOMY N/A 03/16/2019   Procedure: CENTRAL LAMINECTOMIES L2-3, L3-4 AND L4-5;  Surgeon: Jessy Oto, MD;  Location: [facility];  Service: Orthopedics;  Laterality: N/A;  . TOTAL HIP ARTHROPLASTY Right 04/27/2018   Procedure: RIGHT TOTAL HIP ARTHROPLASTY ANTERIOR APPROACH;  Surgeon: Leandrew Koyanagi, MD;  Location: [facility];  Service: Orthopedics;  Laterality: Right;  . TUBAL LIGATION     Social History   Occupational History  . Not on file  Tobacco Use  . Smoking status: Former Smoker    Packs/day: 0.10    Quit date: 11/26/1983    Years since quitting: 36.5  . Smokeless tobacco: Never Used  Vaping Use  . Vaping Use: Never used  Substance and Sexual Activity  . Alcohol use: Yes    Alcohol/week: 0.0 standard drinks    Comment: rarely  . Drug use: Not Currently    Types: Marijuana    Comment: last used 2019  . Sexual activity: Not Currently    Birth control/protection: Surgical

## 2020-06-22 NOTE — Patient Instructions (Signed)
May use the wrist splints as needed. Our office will contact you to schedule for a right open carpal tunnel release. Olivia Ewing is the surgery scheduler and she will get approval from your insurance and call you. Risk of surgery includes risk infection 1:200, bleeding is not usual, 1 in 1,000,000 risk of blood loss that is significant. Risk to the nerve is 1:30,000.  Carpal Tunnel Syndrome  Carpal tunnel syndrome is a condition that causes pain in your hand and arm. The carpal tunnel is a narrow area located on the palm side of your wrist. Repeated wrist motion or certain diseases may cause swelling within the tunnel. This swelling pinches the main nerve in the wrist (median nerve). What are the causes? This condition may be caused by:  Repeated wrist motions.  Wrist injuries.  Arthritis.  A cyst or tumor in the carpal tunnel.  Fluid buildup during pregnancy. Sometimes the cause of this condition is not known. What increases the risk? This condition is more likely to develop in:  People who have jobs that cause them to repeatedly move their wrists in the same motion, such as Art gallery manager.  Women.  People with certain conditions, such as: ? Diabetes. ? Obesity. ? An underactive thyroid (hypothyroidism). ? Kidney failure. What are the signs or symptoms? Symptoms of this condition include:  A tingling feeling in your fingers, especially in your thumb, index, and middle fingers.  Tingling or numbness in your hand.  An aching feeling in your entire arm, especially when your wrist and elbow are bent for long periods of time.  Wrist pain that goes up your arm to your shoulder.  Pain that goes down into your palm or fingers.  A weak feeling in your hands. You may have trouble grabbing and holding items. Your symptoms may feel worse during the night. How is this diagnosed? This condition is diagnosed with a medical history and physical exam. You may also have  tests, including:  An electromyogram (EMG). This test measures electrical signals sent by your nerves into the muscles.  X-rays. How is this treated? Treatment for this condition includes:  Lifestyle changes. It is important to stop doing or modify the activity that caused your condition.  Physical or occupational therapy.  Medicines for pain and inflammation. This may include medicine that is injected into your wrist.  A wrist splint.  Surgery. Follow these instructions at home: If you have a splint:   Wear it as told by your health care provider. Remove it only as told by your health care provider.  Loosen the splint if your fingers become numb and tingle, or if they turn cold and blue.  Keep the splint clean and dry. General instructions   Take over-the-counter and prescription medicines only as told by your health care provider.  Rest your wrist from any activity that may be causing your pain. If your condition is work related, talk to your employer about changes that can be made, such as getting a wrist pad to use while typing.  If directed, apply ice to the painful area: ? Put ice in a plastic bag. ? Place a towel between your skin and the bag. ? Leave the ice on for 20 minutes, 2-3 times per day.  Keep all follow-up visits as told by your health care provider. This is important.  Do any exercises as told by your health care provider, physical therapist, or occupational therapist. Contact a health care provider if:  You have  new symptoms.  Your pain is not controlled with medicines.  Your symptoms get worse. This information is not intended to replace advice given to you by your health care provider. Make sure you discuss any questions you have with your health care provider. Document Released: 01/26/2000 Document Revised: 06/08/2015 Document Reviewed: 10/09/2016 Elsevier Interactive Patient Education  2017 [employer].

## 2020-06-26 ENCOUNTER — Other Ambulatory Visit: Payer: Self-pay | Admitting: Radiology

## 2020-06-26 MED ORDER — GABAPENTIN 300 MG PO CAPS: 300.0000 mg | ORAL_CAPSULE | Freq: Three times a day (TID) | ORAL | 3 refills | Status: DC

## 2020-06-26 NOTE — Telephone Encounter (Signed)
[facility] sent paper request to our office for refill.

## 2020-06-28 ENCOUNTER — Encounter: Payer: Self-pay | Admitting: Physical Therapy

## 2020-06-28 ENCOUNTER — Other Ambulatory Visit: Payer: Self-pay

## 2020-06-28 ENCOUNTER — Ambulatory Visit: Payer: Medicare HMO | Admitting: Physical Therapy

## 2020-06-28 DIAGNOSIS — M6281 Muscle weakness (generalized): Secondary | ICD-10-CM

## 2020-06-28 DIAGNOSIS — M25611 Stiffness of right shoulder, not elsewhere classified: Secondary | ICD-10-CM

## 2020-06-28 DIAGNOSIS — M25511 Pain in right shoulder: Secondary | ICD-10-CM | POA: Diagnosis not present

## 2020-06-28 NOTE — Therapy (Signed)
[place], [place], [address] Phone: [phone]   Fax:  [phone]  Physical Therapy Treatment  Patient Details  Name: Olivia Ewing MRN: [id number] Date of Birth: [birth year] Referring Provider (PT): Frankey Shown MD   Encounter Date: 06/28/2020   PT End of Session - 06/28/20 1702    Visit Number 13    Number of Visits 19    Date for PT Re-Evaluation 07/22/20    Authorization Type [employer] MCR/MCD    Authorization Time Period re-auth submitted for 06/15/20-07/22/20 for 8 visits    PT Start Time 1700    PT Stop Time 1740    PT Time Calculation (min) 40 min    Activity Tolerance Patient tolerated treatment well;Patient limited by pain    Behavior During Therapy [facility] for tasks assessed/performed           Past Medical History:  Diagnosis Date  . Anemia   . Arthritis    knees hands  . Arthritis, degenerative 11/17/2012  . Bipolar disorder ([place])   . Cervicalgia   . Depression   . Environmental allergies    cause SOB, uses inhaler for  . Environmental and seasonal allergies    uses inhaler prn  . Full dentures   . GERD (gastroesophageal reflux disease)    diet controlled - no meds  . Hyperlipidemia    diet controlled, no meds  . Hypertension   . Lumbar radiculopathy, chronic   . Right knee pain    posterior horn medial meniscal tear MRI 2013  . Spinal stenosis    getting epidural injections -last one 06/12/2015  . SVD (spontaneous vaginal delivery)    x 4    Past Surgical History:  Procedure Laterality Date  . APPENDECTOMY    . Bladder tack  10/2006   cystocoele  . BREAST SURGERY Right    benign cyst  . CARPAL TUNNEL RELEASE Left 10/25/2019   Procedure: LEFT CARPAL TUNNEL RELEASE;  Surgeon: Jessy Oto, MD;  Location: [place];  Service: Orthopedics;  Laterality: Left;  . COLONOSCOPY    . Hysterectomy other    . IRRIGATION AND DEBRIDEMENT SHOULDER Right 02/21/2020    Procedure: IRRIGATION AND DEBRIDEMENT RIGHT SHOULDER;  Surgeon: Leandrew Koyanagi, MD;  Location: [place];  Service: Orthopedics;  Laterality: Right;  . LUMBAR LAMINECTOMY N/A 03/16/2019   Procedure: CENTRAL LAMINECTOMIES L2-3, L3-4 AND L4-5;  Surgeon: Jessy Oto, MD;  Location: [place];  Service: Orthopedics;  Laterality: N/A;  . TOTAL HIP ARTHROPLASTY Right 04/27/2018   Procedure: RIGHT TOTAL HIP ARTHROPLASTY ANTERIOR APPROACH;  Surgeon: Leandrew Koyanagi, MD;  Location: [place];  Service: Orthopedics;  Laterality: Right;  . TUBAL LIGATION      There were no vitals filed for this visit.   Subjective Assessment - 06/28/20 1702    Subjective Pt feel like her shoulder is improving.  She feels like she is gaining strength    Pertinent History Gout, Staph Aureus infection of shld. Cervicalgia.  Lumbar radiculopathy knee pain. R THA  2020, spinal stenosis ( wears back brace)    Limitations Lifting    How long can you sit comfortably? unlimited    How long can you stand comfortably? no issues today    How long can you walk comfortably? before my shld I used to walk 2 x a day 1-2 miles    Diagnostic tests MRI shld    Pain Score 0-No  pain                             [facility] Adult PT Treatment/Exercise - 06/28/20 0001      Shoulder Exercises: Standing   External Rotation Right;15 reps   2 sets   Theraband Level (Shoulder External Rotation) Level 1 (Yellow)    Flexion Strengthening;Right;10 reps   2 sets   Theraband Level (Shoulder Flexion) --    ABduction Strengthening;Right;10 reps;AROM   2 sets   Extension Both;15 reps   2 sets   Theraband Level (Shoulder Extension) Level 3 (Green)    Row Both;15 reps   2 sets   Theraband Level (Shoulder Row) Level 3 (Green)    Other Standing Exercises bicep curl 2x10 green tband; tricep 2x10 green tband    Other Standing Exercises towel slide for flexoin stretch      Shoulder Exercises: Pulleys   Flexion 2 minutes    Scaption  2 minutes      Shoulder Exercises: ROM/Strengthening   UBE (Upper Arm Bike) L2 2.5 minutes forward,2.5 min back      Modalities   Modalities Vasopneumatic      Vasopneumatic   Number Minutes Vasopneumatic  10 minutes    Vasopnuematic Location  Shoulder    Vasopneumatic Pressure Medium    Vasopneumatic Temperature  34                       PT Long Term Goals - 06/15/20 2210      PT LONG TERM GOAL #1   Title Pt will be independent with advanced HEP    Status On-going    Target Date 07/22/20      PT LONG TERM GOAL #2   Title Pt will improve shoulder strength to be able to lift at least 20 #s overhead to lift items in kitchen and household shelves. 06/15/20:Revise to 8lbs to manage a gallon of milk    Baseline 1 lb    Status Revised    Target Date 07/22/20      PT LONG TERM GOAL #3   Title Pt will be able to carry at least 20  lbs without pain up and down steps. Revised to 10lbs    Baseline Unable    Status Revised    Target Date 07/22/20      PT LONG TERM GOAL #4   Title Pt will be able to dress herself without pain and without compensation. 06/15/20: Minimal R shoulder shrug.    Baseline Pt performs dressing with compensatory movements    Status On-going    Target Date 07/22/20      PT LONG TERM GOAL #5   Title Pt will improve FOTO intake  score to at least 55%    Baseline FOTO 80%    Status Achieved    Target Date 06/13/20      Additional Long Term Goals   Additional Long Term Goals Yes      PT LONG TERM GOAL #6   Title Improve R shoulderstrength to 4 to 4+/5 strength for improved function use of the R shoulder/UE    Baseline 4- to 4/5    Status New    Target Date 07/22/20                 Plan - 06/28/20 1740    Clinical Impression Statement Pt is progressing well with therapy.  Pt able to  increase resistance with standing band exercises today.  Used towel slides on wall to work on [employer].  No adverse events.  No pain.  Will continue to  progress strength as able.    Personal Factors and Comorbidities Comorbidity 2    Comorbidities Gout, Staph Aureus infection of shld. Cervicalgia.  Lumbar radiculopathy knee pain. R THA  2020, spinal stenosis ( wears back brace) HTN  Spinal stenosis    Examination-Activity Limitations Reach Overhead;Dressing;Hygiene/Grooming;Lift;Carry;Caring for Others    Examination-Participation Restrictions Volunteer;Laundry    Stability/Clinical Decision Making Evolving/Moderate complexity    Rehab Potential Good    PT Frequency 2x / week    PT Duration 2 weeks   then 1 week 4   PT Treatment/Interventions ADLs/Self Care Home Management;Cryotherapy;Electrical Stimulation;Joint Manipulations;Dry needling;Taping;Passive range of motion;Manual techniques;Neuromuscular re-education;Patient/family education;Therapeutic exercise;Therapeutic activities;Iontophoresis 4mg /ml Dexamethasone;Ultrasound;Moist Heat    PT Next Visit Plan Assess response to today's session. Continue gentle shoulder strengthening as able. Continue to work on shoulder abduction AAROM (consider sidelying). Progress bicep and tricep strengthening.    PT Home Exercise Plan [id number]    Consulted and Agree with Plan of Care Patient           Patient will benefit from skilled therapeutic intervention in order to improve the following deficits and impairments:  Pain,Postural dysfunction,Improper body mechanics,Impaired UE functional use,Increased fascial restricitons,Increased muscle spasms,Impaired flexibility,Decreased range of motion,Decreased strength  Visit Diagnosis: Right shoulder pain, unspecified chronicity  Stiffness of right shoulder, not elsewhere classified  Muscle weakness (generalized)     Problem List Patient Active Problem List   Diagnosis Date Noted  . Insomnia 06/08/2020  . Immunization due 03/22/2020  . Septic arthritis ([place]) 02/25/2020  . Medication monitoring encounter 02/25/2020  . MSSA (methicillin susceptible  Staphylococcus aureus) infection 02/24/2020  . Septic arthritis of right acromioclavicular joint ([place])   . Abscess of bursa of right shoulder 02/10/2020  . S/P arthroscopy of right shoulder 01/05/2020  . Carpal tunnel syndrome, left upper limb 10/25/2019    Class: Chronic  . Carpal tunnel syndrome, right upper limb 10/25/2019    Class: Chronic  . Nontraumatic incomplete tear of right rotator cuff 07/27/2019  . Tendinopathy of right biceps tendon 07/27/2019  . Breast cancer screening by mammogram 06/07/2019  . Seasonal allergic rhinitis due to pollen 06/07/2019  . Colon cancer screening 06/07/2019  . Spinal stenosis, lumbar region, with neurogenic claudication 03/16/2019    Class: Chronic  . Status post lumbar laminectomy 03/16/2019  . Acute stress reaction 09/25/2018  . Primary osteoarthritis of right hip 04/27/2018  . Status post total hip replacement, 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  . Shortness of breath 01/02/2018  . Chalazion left upper eyelid 02/20/2017  . Vitamin D insufficiency 11/25/2013  . Healthcare maintenance 11/25/2013  . Arthrosis of right acromioclavicular joint 06/05/2012  . Rectocele 04/28/2012  . Left knee DJD, degenerative meniscus tear 04/06/2012  . Numbness and tingling in hands 03/17/2012  . Periodontitis 09/23/2011  . Depression 09/23/2011  . Right knee meniscal tear 03/14/2011  . Lumbar radiculopathy 03/14/2011  . Functional incontinence 10/18/2010  . NECK PAIN, CHRONIC 08/19/2008  . Iron deficiency anemia 05/12/2008  . SHOULDER PAIN, RIGHT 04/28/2008  . Hyperlipemia 06/18/2007  . Essential hypertension 05/20/2007    Mathis Dad 06/28/2020, 5:43 PM  [facility] [address] [place], [place], [address] Phone: [phone]   Fax:  [phone]  Name: DENIZ ESKRIDGE MRN: [id number] Date of Birth: 11-Dec-1950

## 2020-06-29 ENCOUNTER — Ambulatory Visit: Payer: Medicare HMO | Admitting: Orthopaedic Surgery

## 2020-07-05 ENCOUNTER — Ambulatory Visit: Payer: Medicare HMO | Admitting: Physical Therapy

## 2020-07-05 ENCOUNTER — Ambulatory Visit: Payer: Medicare HMO | Admitting: Behavioral Health

## 2020-07-05 DIAGNOSIS — F4323 Adjustment disorder with mixed anxiety and depressed mood: Secondary | ICD-10-CM

## 2020-07-05 NOTE — BH Specialist Note (Signed)
Integrated Behavioral Health via Telemedicine Visit  07/05/2020 Olivia Ewing [id number]  Number of [place] visits: 4/6 Session Start time: 11:00am  Session End time: 11:50am Total time: 50   Referring Provider: Dr. [name], MD Patient/Family location: Pt is f:f with Clinician @ [facility] Provider location: [facility] Office All persons participating in visit: Pt & Clinician Types of Service: Individual psychotherapy  I connected with [name] and/or [name]'s self via  Telephone or Video Enabled Telemedicine Application  (Video is Caregility application) and verified that I am speaking with the correct person using two identifiers. Discussed confidentiality: Yes   I discussed the limitations of telemedicine and the availability of in person appointments.  Discussed there is a possibility of technology failure and discussed alternative modes of communication if that failure occurs.  I discussed that engaging in this telemedicine visit, they consent to the provision of behavioral healthcare and the services will be billed under their insurance.  Patient and/or legal guardian expressed understanding and consented to Telemedicine visit: Yes   Presenting Concerns: Patient and/or family reports the following symptoms/concerns: Pt reports her 2 Sons invld in the [place] are @ opposing ends of the continuum; Son who has been gone for 59yrs is being released in early Oct. Her other Son is being convicted for murder & could get 28-34 yrs imprisonment. Pt's heart is beyond broken.  Pt is also anxious bc she & her Aunt need to find housing before Nov 2022. Duration of problem: years; Severity of problem: moderate to severe  Patient and/or Family's Strengths/Protective Factors: Social connections, Social and Emotional competence, Sense of purpose and Physical Health (exercise, healthy diet, medication compliance, etc.)  Goals  Addressed: Patient will: 1.  Reduce symptoms of: anxiety, depression and stress  2.  Increase knowledge and/or ability of: coping skills, healthy habits and stress reduction  3.  Demonstrate ability to: Increase healthy adjustment to current life circumstances, Increase adequate support systems for patient/family and Begin healthy grieving over loss  Progress towards Goals: Ongoing  Interventions: Interventions utilized:  Solution-Focused Strategies, Behavioral Activation and Supportive Counseling Standardized Assessments completed: screeners prn  Patient and/or Family Response: Pt receptive to visit today & requests return visit  Assessment: Patient currently experiencing elevated anx w/dep'd mood for circumstances in her family. Pt is trying to recover from health status changes that have been a struggle for her in the past year.  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 Assist Pt to regain her momentum & energy for her life.  Plan: 1. Follow up with behavioral health clinician on : 3 wks for 60 min on telehealth 2. Behavioral recommendations: Explore [facility] for help w/housing & other resources. Be kind to yourself w/self-care practices. Research support grps for caregivers. 3. Referral(s): [facility] (In Clinic)  I discussed the assessment and treatment plan with the patient and/or parent/guardian. They were provided an opportunity to ask questions and all were answered. They agreed with the plan and demonstrated an understanding of the instructions.   They were advised to call back or seek an in-person evaluation if the symptoms worsen or if the condition fails to improve as anticipated.  Olivia Hutching, LMFT

## 2020-07-07 ENCOUNTER — Encounter: Payer: Medicare HMO | Admitting: Physical Therapy

## 2020-07-12 ENCOUNTER — Other Ambulatory Visit: Payer: Self-pay

## 2020-07-12 ENCOUNTER — Encounter: Payer: Self-pay | Admitting: Physical Therapy

## 2020-07-12 ENCOUNTER — Ambulatory Visit: Payer: Medicare HMO | Attending: Orthopaedic Surgery | Admitting: Physical Therapy

## 2020-07-12 DIAGNOSIS — M6281 Muscle weakness (generalized): Secondary | ICD-10-CM | POA: Diagnosis present

## 2020-07-12 DIAGNOSIS — M25611 Stiffness of right shoulder, not elsewhere classified: Secondary | ICD-10-CM | POA: Insufficient documentation

## 2020-07-12 DIAGNOSIS — M25511 Pain in right shoulder: Secondary | ICD-10-CM | POA: Insufficient documentation

## 2020-07-12 MED ORDER — PROMETHAZINE HCL 25 MG PO TABS: 25.0000 mg | ORAL_TABLET | Freq: Three times a day (TID) | ORAL | 0 refills | Status: DC | PRN

## 2020-07-12 NOTE — Therapy (Signed)
[place], [place], [address] Phone: [phone]   Fax:  [phone]  Physical Therapy Treatment  Patient Details  Name: Olivia Ewing MRN: [id number] Date of Birth: 01-Oct-1950 Referring Provider (PT): Frankey Shown MD   Encounter Date: 07/12/2020   PT End of Session - 07/12/20 1448    Visit Number 14    Number of Visits 19    Date for PT Re-Evaluation 07/22/20    Authorization Type [employer] MCR/MCD    Authorization Time Period re-auth submitted for 06/15/20-07/22/20 for 8 visits    PT Start Time 1445    PT Stop Time 1535   GAME READY   PT Time Calculation (min) 50 min    Activity Tolerance Patient tolerated treatment well;Patient limited by pain    Behavior During Therapy [facility] for tasks assessed/performed           Past Medical History:  Diagnosis Date  . Anemia   . Arthritis    knees hands  . Arthritis, degenerative 11/17/2012  . Bipolar disorder ([place])   . Cervicalgia   . Depression   . Environmental allergies    cause SOB, uses inhaler for  . Environmental and seasonal allergies    uses inhaler prn  . Full dentures   . GERD (gastroesophageal reflux disease)    diet controlled - no meds  . Hyperlipidemia    diet controlled, no meds  . Hypertension   . Lumbar radiculopathy, chronic   . Right knee pain    posterior horn medial meniscal tear MRI 2013  . Spinal stenosis    getting epidural injections -last one 06/12/2015  . SVD (spontaneous vaginal delivery)    x 4    Past Surgical History:  Procedure Laterality Date  . APPENDECTOMY    . Bladder tack  10/2006   cystocoele  . BREAST SURGERY Right    benign cyst  . CARPAL TUNNEL RELEASE Left 10/25/2019   Procedure: LEFT CARPAL TUNNEL RELEASE;  Surgeon: Jessy Oto, MD;  Location: [place];  Service: Orthopedics;  Laterality: Left;  . COLONOSCOPY    . Hysterectomy other    . IRRIGATION AND DEBRIDEMENT SHOULDER Right  02/21/2020   Procedure: IRRIGATION AND DEBRIDEMENT RIGHT SHOULDER;  Surgeon: Leandrew Koyanagi, MD;  Location: [place];  Service: Orthopedics;  Laterality: Right;  . LUMBAR LAMINECTOMY N/A 03/16/2019   Procedure: CENTRAL LAMINECTOMIES L2-3, L3-4 AND L4-5;  Surgeon: Jessy Oto, MD;  Location: [place];  Service: Orthopedics;  Laterality: N/A;  . TOTAL HIP ARTHROPLASTY Right 04/27/2018   Procedure: RIGHT TOTAL HIP ARTHROPLASTY ANTERIOR APPROACH;  Surgeon: Leandrew Koyanagi, MD;  Location: [place];  Service: Orthopedics;  Laterality: Right;  . TUBAL LIGATION      There were no vitals filed for this visit.   Subjective Assessment - 07/12/20 1448    Subjective Pt feel like her shoulder is doing great.  She feels the exercises at home are easy.  She has been HEP compliant.    Pertinent History Gout, Staph Aureus infection of shld. Cervicalgia.  Lumbar radiculopathy knee pain. R THA  2020, spinal stenosis ( wears back brace)    Limitations Lifting    How long can you sit comfortably? unlimited    How long can you stand comfortably? no issues today    How long can you walk comfortably? before my shld I used to walk 2 x a day 1-2 miles  Diagnostic tests MRI shld    Currently in Pain? No/denies                             [facility] Adult PT Treatment/Exercise - 07/12/20 0001      Shoulder Exercises: Standing   External Rotation Right;10 reps   2 sets   Theraband Level (Shoulder External Rotation) Level 2 (Red)    External Rotation Limitations 2x10    Flexion Strengthening;Right;10 reps;Theraband    Theraband Level (Shoulder Flexion) Level 1 (Yellow)    ABduction Strengthening;Right;10 reps;AROM   2 sets   Theraband Level (Shoulder ABduction) Level 1 (Yellow)    Extension Both;15 reps   2 sets   Theraband Level (Shoulder Extension) Level 4 (Blue)    Row Limitations Cable row: 2x10 15#    Other Standing Exercises bicep curl 2x10 blue tband; tricep 2x10 blue tband     Other Standing Exercises towel slide for flexoin stretch      Vasopneumatic   Number Minutes Vasopneumatic  10 minutes    Vasopnuematic Location  Shoulder    Vasopneumatic Pressure Medium    Vasopneumatic Temperature  34                  PT Education - 07/12/20 1451    Education Details updated HEP    Person(s) Educated Patient    Methods Explanation;Demonstration    Comprehension Verbalized understanding;Returned demonstration               PT Long Term Goals - 06/15/20 2210      PT LONG TERM GOAL #1   Title Pt will be independent with advanced HEP    Status On-going    Target Date 07/22/20      PT LONG TERM GOAL #2   Title Pt will improve shoulder strength to be able to lift at least 20 #s overhead to lift items in kitchen and household shelves. 06/15/20:Revise to 8lbs to manage a gallon of milk    Baseline 1 lb    Status Revised    Target Date 07/22/20      PT LONG TERM GOAL #3   Title Pt will be able to carry at least 20  lbs without pain up and down steps. Revised to 10lbs    Baseline Unable    Status Revised    Target Date 07/22/20      PT LONG TERM GOAL #4   Title Pt will be able to dress herself without pain and without compensation. 06/15/20: Minimal R shoulder shrug.    Baseline Pt performs dressing with compensatory movements    Status On-going    Target Date 07/22/20      PT LONG TERM GOAL #5   Title Pt will improve FOTO intake  score to at least 55%    Baseline FOTO 80%    Status Achieved    Target Date 06/13/20      Additional Long Term Goals   Additional Long Term Goals Yes      PT LONG TERM GOAL #6   Title Improve R shoulderstrength to 4 to 4+/5 strength for improved function use of the R shoulder/UE    Baseline 4- to 4/5    Status New    Target Date 07/22/20                 Plan - 07/12/20 1455    Clinical Impression Statement Pt progressing well with  therapy.  Able to progress intensity of several band exercises.  Reviewed  HEP and issued new TB (green) to increase intensity.  Pt feels she will be ready for D/C next visit.  She reports she is only limited in reaching to higher shelves at this point, but this is improving.  She does fatigue rapidly with resisted flexion/abd today.    Personal Factors and Comorbidities Comorbidity 2    Comorbidities Gout, Staph Aureus infection of shld. Cervicalgia.  Lumbar radiculopathy knee pain. R THA  2020, spinal stenosis ( wears back brace) HTN  Spinal stenosis    Examination-Activity Limitations Reach Overhead;Dressing;Hygiene/Grooming;Lift;Carry;Caring for Others    Examination-Participation Restrictions Volunteer;Laundry    Stability/Clinical Decision Making Evolving/Moderate complexity    Rehab Potential Good    PT Frequency 2x / week    PT Duration 2 weeks   then 1 week 4   PT Treatment/Interventions ADLs/Self Care Home Management;Cryotherapy;Electrical Stimulation;Joint Manipulations;Dry needling;Taping;Passive range of motion;Manual techniques;Neuromuscular re-education;Patient/family education;Therapeutic exercise;Therapeutic activities;Iontophoresis 4mg /ml Dexamethasone;Ultrasound;Moist Heat    PT Next Visit Plan Assess response to today's session. Continue gentle shoulder strengthening as able. Continue to work on shoulder abduction AAROM (consider sidelying). Progress bicep and tricep strengthening.    PT Home Exercise Plan [id number]    Consulted and Agree with Plan of Care Patient           Patient will benefit from skilled therapeutic intervention in order to improve the following deficits and impairments:  Pain,Postural dysfunction,Improper body mechanics,Impaired UE functional use,Increased fascial restricitons,Increased muscle spasms,Impaired flexibility,Decreased range of motion,Decreased strength  Visit Diagnosis: Right shoulder pain, unspecified chronicity  Stiffness of right shoulder, not elsewhere classified  Muscle weakness  (generalized)     Problem List Patient Active Problem List   Diagnosis Date Noted  . Insomnia 06/08/2020  . Immunization due 03/22/2020  . Septic arthritis ([place]) 02/25/2020  . Medication monitoring encounter 02/25/2020  . MSSA (methicillin susceptible Staphylococcus aureus) infection 02/24/2020  . Septic arthritis of right acromioclavicular joint ([place])   . Abscess of bursa of right shoulder 02/10/2020  . S/P arthroscopy of right shoulder 01/05/2020  . Carpal tunnel syndrome, left upper limb 10/25/2019    Class: Chronic  . Carpal tunnel syndrome, right upper limb 10/25/2019    Class: Chronic  . Nontraumatic incomplete tear of right rotator cuff 07/27/2019  . Tendinopathy of right biceps tendon 07/27/2019  . Breast cancer screening by mammogram 06/07/2019  . Seasonal allergic rhinitis due to pollen 06/07/2019  . Colon cancer screening 06/07/2019  . Spinal stenosis, lumbar region, with neurogenic claudication 03/16/2019    Class: Chronic  . Status post lumbar laminectomy 03/16/2019  . Acute stress reaction 09/25/2018  . Primary osteoarthritis of right hip 04/27/2018  . Status post total hip replacement, 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  . Shortness of breath 01/02/2018  . Chalazion left upper eyelid 02/20/2017  . Vitamin D insufficiency 11/25/2013  . Healthcare maintenance 11/25/2013  . Arthrosis of right acromioclavicular joint 06/05/2012  . Rectocele 04/28/2012  . Left knee DJD, degenerative meniscus tear 04/06/2012  . Numbness and tingling in hands 03/17/2012  . Periodontitis 09/23/2011  . Depression 09/23/2011  . Right knee meniscal tear 03/14/2011  . Lumbar radiculopathy 03/14/2011  . Functional incontinence 10/18/2010  . NECK PAIN, CHRONIC 08/19/2008  . Iron deficiency anemia 05/12/2008  . SHOULDER PAIN, RIGHT 04/28/2008  . Hyperlipemia 06/18/2007  . Essential hypertension 05/20/2007    Shearon Balo PT, DPT 07/12/20 3:50 PM  [facility]  [place], [place], [address] Phone: [phone]   Fax:  [phone]  Name: Olivia Ewing MRN: [id number] Date of Birth: [birth year]

## 2020-07-12 NOTE — Telephone Encounter (Signed)
Pt is requesting her promethazine (PHENERGAN) 25 MG tablet(Expired) sent to  [facility], [place] Phone:  [phone]  Fax:  [phone]

## 2020-07-12 NOTE — Telephone Encounter (Signed)
Pt has an upcoming appt  As well .Marland Kitchen but her insurance /pharmacy  Is trying to send out off her medicine  This weekend

## 2020-07-13 ENCOUNTER — Encounter: Payer: Self-pay | Admitting: Student

## 2020-07-13 DIAGNOSIS — Z1231 Encounter for screening mammogram for malignant neoplasm of breast: Secondary | ICD-10-CM

## 2020-07-16 ENCOUNTER — Encounter: Payer: Self-pay | Admitting: *Deleted

## 2020-07-17 ENCOUNTER — Encounter: Payer: Self-pay | Admitting: Specialist

## 2020-07-17 ENCOUNTER — Telehealth: Payer: Self-pay | Admitting: *Deleted

## 2020-07-17 ENCOUNTER — Encounter: Payer: Self-pay | Admitting: Student

## 2020-07-17 ENCOUNTER — Encounter: Payer: Medicare HMO | Admitting: Student

## 2020-07-17 NOTE — Telephone Encounter (Signed)
Call placed to patient for today's missed appt. No answer and no VMB set up.

## 2020-07-19 ENCOUNTER — Ambulatory Visit: Payer: Medicare HMO | Admitting: Physical Therapy

## 2020-07-19 ENCOUNTER — Other Ambulatory Visit: Payer: Self-pay | Admitting: Student

## 2020-07-19 ENCOUNTER — Other Ambulatory Visit: Payer: Self-pay

## 2020-07-19 DIAGNOSIS — M25511 Pain in right shoulder: Secondary | ICD-10-CM

## 2020-07-19 DIAGNOSIS — M25611 Stiffness of right shoulder, not elsewhere classified: Secondary | ICD-10-CM

## 2020-07-19 DIAGNOSIS — M6281 Muscle weakness (generalized): Secondary | ICD-10-CM

## 2020-07-19 NOTE — Therapy (Signed)
[place], [place], [address] Phone: [phone]   Fax:  [phone]  Physical Therapy Treatment and Discharge  Patient Details  Name: Olivia Ewing MRN: [id number] Date of Birth: 02/05/1951 Referring Provider (PT): Frankey Shown MD   PHYSICAL THERAPY DISCHARGE SUMMARY  Visits from Start of Care: 15  Current functional level related to goals / functional outcomes: See below   Remaining deficits: See below   Education / Equipment: See below  Plan: Patient agrees to discharge.  Patient goals were partially met. Patient is being discharged due to meeting the stated rehab goals.  ?????       Encounter Date: 07/19/2020   PT End of Session - 07/19/20 1625    Visit Number 15    Number of Visits 19    Date for PT Re-Evaluation 07/22/20    Authorization Type [employer] MCR/MCD    Authorization Time Period re-auth submitted for 06/15/20-07/22/20 for 8 visits    PT Start Time 1556    PT Stop Time 1620    PT Time Calculation (min) 24 min    Activity Tolerance Patient tolerated treatment well    Behavior During Therapy [facility] for tasks assessed/performed           Past Medical History:  Diagnosis Date  . Anemia   . Arthritis    knees hands  . Arthritis, degenerative 11/17/2012  . Bipolar disorder ([place])   . Cervicalgia   . Depression   . Environmental allergies    cause SOB, uses inhaler for  . Environmental and seasonal allergies    uses inhaler prn  . Full dentures   . GERD (gastroesophageal reflux disease)    diet controlled - no meds  . Hyperlipidemia    diet controlled, no meds  . Hypertension   . Lumbar radiculopathy, chronic   . Right knee pain    posterior horn medial meniscal tear MRI 2013  . Spinal stenosis    getting epidural injections -last one 06/12/2015  . SVD (spontaneous vaginal delivery)    x 4    Past Surgical History:  Procedure Laterality Date  . APPENDECTOMY    .  Bladder tack  10/2006   cystocoele  . BREAST SURGERY Right    benign cyst  . CARPAL TUNNEL RELEASE Left 10/25/2019   Procedure: LEFT CARPAL TUNNEL RELEASE;  Surgeon: Jessy Oto, MD;  Location: [place];  Service: Orthopedics;  Laterality: Left;  . COLONOSCOPY    . Hysterectomy other    . IRRIGATION AND DEBRIDEMENT SHOULDER Right 02/21/2020   Procedure: IRRIGATION AND DEBRIDEMENT RIGHT SHOULDER;  Surgeon: Leandrew Koyanagi, MD;  Location: [place];  Service: Orthopedics;  Laterality: Right;  . LUMBAR LAMINECTOMY N/A 03/16/2019   Procedure: CENTRAL LAMINECTOMIES L2-3, L3-4 AND L4-5;  Surgeon: Jessy Oto, MD;  Location: [place];  Service: Orthopedics;  Laterality: N/A;  . TOTAL HIP ARTHROPLASTY Right 04/27/2018   Procedure: RIGHT TOTAL HIP ARTHROPLASTY ANTERIOR APPROACH;  Surgeon: Leandrew Koyanagi, MD;  Location: [place];  Service: Orthopedics;  Laterality: Right;  . TUBAL LIGATION      There were no vitals filed for this visit.   Subjective Assessment - 07/19/20 1600    Subjective Pt states her shoulder is doing good. She states she's been working on her shoulder abduction without her arm shaking.    Pertinent History Gout, Staph Aureus infection of shld. Cervicalgia.  Lumbar radiculopathy knee pain. R  THA  2020, spinal stenosis ( wears back brace)    Limitations Lifting    How long can you sit comfortably? unlimited    How long can you stand comfortably? no issues today    How long can you walk comfortably? before my shld I used to walk 2 x a day 1-2 miles    Diagnostic tests MRI shld    Currently in Pain? No/denies                             [facility] Adult PT Treatment/Exercise - 07/19/20 0001      Shoulder Exercises: Standing   Other Standing Exercises 5# to bottom cabinet shelf x 5; 8# to bottom cabinet shelf x5    Other Standing Exercises Overhead press 5# x5, overhead press 10# x10      Shoulder Exercises: ROM/Strengthening   UBE  (Upper Arm Bike) L1 x 4 min    Lat Pull 15 reps    Lat Pull Limitations 15#    Cybex Row 15 reps    Cybex Row Limitations 15#                  PT Education - 07/19/20 1627    Education Details Advancing her exercises independently and safely; return to the gym and use of gym equipment.    Person(s) Educated Patient    Methods Explanation;Demonstration    Comprehension Verbalized understanding;Returned demonstration               PT Long Term Goals - 07/19/20 1604      PT LONG TERM GOAL #1   Title Pt will be independent with advanced HEP    Status Achieved      PT LONG TERM GOAL #2   Title Pt will improve shoulder strength to be able to lift at least 20 #s overhead to lift items in kitchen and household shelves. 06/15/20:Revise to 8lbs to manage a gallon of milk    Baseline 1 lb; able with mild compensations 07/19/20    Status Achieved      PT LONG TERM GOAL #3   Title Pt will be able to carry at least 20  lbs without pain up and down steps. Revised to 10lbs    Baseline --    Status Achieved      PT LONG TERM GOAL #4   Title Pt will be able to dress herself without pain and without compensation. 06/15/20: Minimal R shoulder shrug.    Baseline Pt performs dressing with compensatory movements    Status Achieved      PT LONG TERM GOAL #5   Title Pt will improve FOTO intake  score to at least 55%    Baseline FOTO 80%    Status Achieved      PT LONG TERM GOAL #6   Title Improve R shoulderstrength to 4 to 4+/5 strength for improved function use of the R shoulder/UE    Baseline 4- to 4/5; Shoulder abduction 3-/5 (due to limited ROM without compensation), otherwise shoulder grossly 4/5 to 4+/5    Status Partially Met                 Plan - 07/19/20 1625    Clinical Impression Statement Pt is ready for d/c this session. Pt has met or partially met all of her LTGs. Biggest deficit is still with shoulder abduction -- pt unable to go past 90 deg without  compensation/shaking. Pt states she can work on this independently at home. All other shoulder motions appear WFL with at least 4 to 4+/5 strength. [facility] Demonstrated a few exercise machines that would be beneficial.           Patient will benefit from skilled therapeutic intervention in order to improve the following deficits and impairments:     Visit Diagnosis: Right shoulder pain, unspecified chronicity  Stiffness of right shoulder, not elsewhere classified  Muscle weakness (generalized)     Problem List Patient Active Problem List   Diagnosis Date Noted  . Insomnia 06/08/2020  . Immunization due 03/22/2020  . Septic arthritis ([place]) 02/25/2020  . Medication monitoring encounter 02/25/2020  . MSSA (methicillin susceptible Staphylococcus aureus) infection 02/24/2020  . Septic arthritis of right acromioclavicular joint ([place])   . Abscess of bursa of right shoulder 02/10/2020  . S/P arthroscopy of right shoulder 01/05/2020  . Carpal tunnel syndrome, left upper limb 10/25/2019    Class: Chronic  . Carpal tunnel syndrome, right upper limb 10/25/2019    Class: Chronic  . Nontraumatic incomplete tear of right rotator cuff 07/27/2019  . Tendinopathy of right biceps tendon 07/27/2019  . Breast cancer screening by mammogram 06/07/2019  . Seasonal allergic rhinitis due to pollen 06/07/2019  . Colon cancer screening 06/07/2019  . Spinal stenosis, lumbar region, with neurogenic claudication 03/16/2019    Class: Chronic  . Status post lumbar laminectomy 03/16/2019  . Acute stress reaction 09/25/2018  . Primary osteoarthritis of right hip 04/27/2018  . Status post total hip replacement, 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  . Shortness of breath 01/02/2018  . Chalazion left upper eyelid 02/20/2017  . Vitamin D insufficiency 11/25/2013  . Healthcare maintenance 11/25/2013  . Arthrosis of right acromioclavicular joint 06/05/2012  . Rectocele  04/28/2012  . Left knee DJD, degenerative meniscus tear 04/06/2012  . Numbness and tingling in hands 03/17/2012  . Periodontitis 09/23/2011  . Depression 09/23/2011  . Right knee meniscal tear 03/14/2011  . Lumbar radiculopathy 03/14/2011  . Functional incontinence 10/18/2010  . NECK PAIN, CHRONIC 08/19/2008  . Iron deficiency anemia 05/12/2008  . SHOULDER PAIN, RIGHT 04/28/2008  . Hyperlipemia 06/18/2007  . Essential hypertension 05/20/2007    [facility] April Ma L Elianys Conry PT, DPT 07/19/2020, 4:28 PM  [facility] [address] [place], [place], [address] Phone: [phone]   Fax:  [phone]  Name: TEREASA YILMAZ MRN: [id number] Date of Birth: [birth year]

## 2020-07-20 ENCOUNTER — Ambulatory Visit: Payer: Self-pay

## 2020-07-20 ENCOUNTER — Encounter: Payer: Self-pay | Admitting: Surgery

## 2020-07-20 ENCOUNTER — Ambulatory Visit (INDEPENDENT_AMBULATORY_CARE_PROVIDER_SITE_OTHER): Payer: Medicare HMO | Admitting: Surgery

## 2020-07-20 VITALS — BP 123/86 | HR 87 | Ht 65.0 in | Wt 180.0 lb

## 2020-07-20 DIAGNOSIS — M654 Radial styloid tenosynovitis [de Quervain]: Secondary | ICD-10-CM | POA: Diagnosis not present

## 2020-07-20 DIAGNOSIS — M65342 Trigger finger, left ring finger: Secondary | ICD-10-CM

## 2020-07-20 DIAGNOSIS — M25532 Pain in left wrist: Secondary | ICD-10-CM

## 2020-07-20 NOTE — Progress Notes (Signed)
Office Visit Note   Patient: Olivia Ewing           Date of Birth: 1950/05/07           MRN: [id number] Visit Date: 07/20/2020              Requested by: [name], MD [address] [place],  [place] [address] PCP: [name], MD   Assessment & Plan: Visit Diagnoses:  1. Pain in left wrist   2. Trigger finger, left ring finger   3. Tenosynovitis, de Quervain LEFT     Plan: In hopes of giving patient relief of her left ring trigger finger offered injection.  Patient consent area over the A1 pulley was prepped with Betadine and Marcaine/betamethasone injection performed.  Tolerated without complication.  Patient did have good relief of her pain with anesthetic in place.  Regards to her left de Quervain's tenosynovitis I advised her to use over-the-counter Voltaren gel twice daily and she was also given a removable thumb spica splint.  Follow-up in 1 week for recheck.  If radial wrist still continues to be symptomatic I will perform Marcaine/betamethasone first dorsal compartment injection.   Follow-Up Instructions: No follow-ups on file.   Orders:  Orders Placed This Encounter  Procedures   Hand/UE Inj: L ring A1   XR Wrist Complete Left   No orders of the defined types were placed in this encounter.     Procedures: Hand/UE Inj: L ring A1 for trigger finger on 07/20/2020 2:44 PM Details: 25 G needle, volar approach Medications: 0.5 mL bupivacaine 0.25 % Outcome: tolerated well, no immediate complications  Marcaine/betamethasone 1/2:1/2 ring trigger finger performed Consent was given by the patient. Patient was prepped and draped in the usual sterile fashion.      Clinical Data: No additional findings.   Subjective: Chief Complaint  Patient presents with   Left Wrist - Pain    HPI 70 year old black female comes in today with complaints of left ring finger locking and pain and left radial wrist pain.  Patient states that both areas been ongoing for a  couple weeks.  No injury.  States that she has locking and pain of the ring finger at the A1 pulley.  No problems of this nature before onset a couple weeks ago.  Lymph radial wrist pain aggravated with gripping and picking up objects.  Denies numbness and tingling.   Objective: Vital Signs: BP 123/86   Pulse 87   Ht 5\' 5"  (1.651 m)   Wt 180 lb (81.6 kg)   BMI 29.95 kg/m   Physical Exam HENT:     Head: Normocephalic.  Eyes:     Extraocular Movements: Extraocular movements intact.  Pulmonary:     Effort: No respiratory distress.  Musculoskeletal:     Comments: Left hand she is exquisitely tender over the ring finger A1 pulley where she has palpable and obvious locking of her finger.  Left wrist she is exquisitely tender over the first compartment.  Positive Finkelstein's test.  Neurological:     Mental Status: She is alert and oriented to person, place, and time.  Psychiatric:        Mood and Affect: Mood normal.    Ortho Exam  Specialty Comments:  No specialty comments available.  Imaging: No results found.   PMFS History: Patient Active Problem List   Diagnosis Date Noted   Insomnia 06/08/2020   Septic arthritis ([place]) 02/25/2020   Medication monitoring encounter 02/25/2020  S/P arthroscopy of right shoulder 01/05/2020   Carpal tunnel syndrome, left upper limb 10/25/2019    Class: Chronic   Carpal tunnel syndrome, right upper limb 10/25/2019    Class: Chronic   Tendinopathy of right biceps tendon 07/27/2019   Breast cancer screening by mammogram 06/07/2019   Seasonal allergic rhinitis due to pollen 06/07/2019   Colon cancer screening 06/07/2019   Spinal stenosis, lumbar region, with neurogenic claudication 03/16/2019    Class: Chronic   Acute stress reaction 09/25/2018   Vitamin D insufficiency 11/25/2013   Healthcare maintenance 11/25/2013   Depression 09/23/2011   Functional incontinence 10/18/2010   Iron deficiency anemia 05/12/2008   Hyperlipemia  06/18/2007   Essential hypertension 05/20/2007   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   Bipolar disorder ([place])    Cervicalgia    Depression    Environmental allergies    cause SOB, uses inhaler for   Environmental and seasonal allergies    uses inhaler prn   Full dentures    GERD (gastroesophageal reflux disease)    diet controlled - no meds   Hyperlipidemia    diet controlled, no meds   Hypertension    Left knee DJD, degenerative meniscus tear 04/06/2012   Steroid injections: 09/2013 12/2013    Lumbar radiculopathy, chronic    Nontraumatic incomplete tear of right rotator cuff 07/27/2019   Primary osteoarthritis of right hip 04/27/2018   Right knee meniscal tear 03/14/2011   Right knee pain    posterior horn medial meniscal tear MRI 2013   Spinal stenosis    getting epidural injections -last one 06/12/2015   Status post lumbar laminectomy 03/16/2019   Status post total hip replacement, 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   SVD (spontaneous vaginal delivery)    x 4    Family History  Problem Relation Age of Onset   Hypertension Mother     Past Surgical History:  Procedure Laterality Date   APPENDECTOMY     Bladder tack  10/2006   cystocoele   BREAST SURGERY Right    benign cyst   CARPAL TUNNEL RELEASE Left 10/25/2019   Procedure: LEFT CARPAL TUNNEL RELEASE;  Surgeon: Jessy Oto, MD;  Location: [place];  Service: Orthopedics;  Laterality: Left;   COLONOSCOPY     Hysterectomy other     IRRIGATION AND DEBRIDEMENT SHOULDER Right 02/21/2020   Procedure: IRRIGATION AND DEBRIDEMENT RIGHT SHOULDER;  Surgeon: Leandrew Koyanagi, MD;  Location: [place];  Service: Orthopedics;  Laterality: Right;   LUMBAR LAMINECTOMY N/A 03/16/2019   Procedure: CENTRAL LAMINECTOMIES L2-3, L3-4 AND L4-5;  Surgeon: Jessy Oto, MD;  Location: [place];  Service:  Orthopedics;  Laterality: N/A;   TOTAL HIP ARTHROPLASTY Right 04/27/2018   Procedure: RIGHT TOTAL HIP ARTHROPLASTY ANTERIOR APPROACH;  Surgeon: Leandrew Koyanagi, MD;  Location: [place];  Service: Orthopedics;  Laterality: Right;   TUBAL LIGATION     Social History   Occupational History   Not on file  Tobacco Use   Smoking status: Former    Packs/day: 0.10    Pack years: 0.00    Types: Cigarettes    Quit date: 11/26/1983    Years since quitting: 36.6   Smokeless tobacco: Never  Vaping Use   Vaping Use: Never used  Substance and Sexual Activity   Alcohol use: Yes  Alcohol/week: 0.0 standard drinks    Comment: rarely   Drug use: Not Currently    Types: Marijuana    Comment: last used 2019   Sexual activity: Not Currently    Birth control/protection: Surgical

## 2020-07-24 ENCOUNTER — Ambulatory Visit (INDEPENDENT_AMBULATORY_CARE_PROVIDER_SITE_OTHER): Payer: Medicare HMO | Admitting: Student

## 2020-07-24 ENCOUNTER — Encounter: Payer: Self-pay | Admitting: Student

## 2020-07-24 ENCOUNTER — Other Ambulatory Visit: Payer: Self-pay

## 2020-07-24 VITALS — BP 131/84 | HR 65 | Temp 98.5°F | Ht 65.0 in | Wt 187.3 lb

## 2020-07-24 DIAGNOSIS — Z23 Encounter for immunization: Secondary | ICD-10-CM

## 2020-07-24 DIAGNOSIS — E785 Hyperlipidemia, unspecified: Secondary | ICD-10-CM | POA: Diagnosis not present

## 2020-07-24 DIAGNOSIS — I10 Essential (primary) hypertension: Secondary | ICD-10-CM

## 2020-07-24 DIAGNOSIS — Z1211 Encounter for screening for malignant neoplasm of colon: Secondary | ICD-10-CM

## 2020-07-24 DIAGNOSIS — J301 Allergic rhinitis due to pollen: Secondary | ICD-10-CM

## 2020-07-24 MED ORDER — LORATADINE 10 MG PO TABS: 10.0000 mg | ORAL_TABLET | Freq: Every day | ORAL | 0 refills | Status: AC

## 2020-07-24 MED ORDER — FLUTICASONE PROPIONATE 50 MCG/ACT NA SUSP: 2.0000 | Freq: Every day | NASAL | 3 refills | Status: DC

## 2020-07-24 NOTE — Assessment & Plan Note (Signed)
Patient declined lipid panel today, last LDL of 119. Patient would benefit from lipid panel at next visit to determine whether further escalation of rosuvastatin as indicated versus addition of ezetimibe -Continue rosuvastatin 20mg  daily -Lipid panel at next visit

## 2020-07-24 NOTE — Patient Instructions (Signed)
Ms. Gillooly,  It was great to see you.  For your sinus congestion, this is most likely seasonal allergies. We recommend that you increase the Flonase use to 1 spray in each nostril twice daily or 2 sprays in each nostril once daily (whichever method provides more relief for you). Additionally, we recommend that you take loratadine (also called Claritin) 10mg  daily. You may also pick up a NetiPot from [facility] or [facility] to help rinse out your sinuses.  For your hyperlipidemia (high cholesterol): We want to obtain a lipid profile today to check your cholesterol.  For your health maintenance needs: We have provided a pneumonia vaccination today, placed a referral for colonoscopy. You will be called to schedule this appointment.  Sincerely, Dr. Paulla Dolly, MD

## 2020-07-24 NOTE — Assessment & Plan Note (Signed)
Patient continues to endorse ongoing seasonal allergies as evidenced by sinus congestion, runny nose, itchy and watery eyes. She has been using 1 spray of Flonase bilaterally daily which she reports improves her symptoms although only temporarily.  -Increase Flonase to 2 sprays daily versus 1 spray twice daily per patient preference  -Reviewed proper use of Flonase spray -Start loratadine 10mg  daily -Obtain Neti Pot and use as needed

## 2020-07-24 NOTE — Progress Notes (Signed)
   CC: Routine follow-up, seasonal allergies, healthcare maintenance  HPI:  Olivia Ewing is a 70 y.o. with past medical history of seasonal allergies, hyperlipidemia and additional history as described below who presents to clinic for follow-up. Refer to problem list for charting of this encounter.  Past Medical History:  Diagnosis Date   Anemia    Arthritis    knees hands   Arthritis, degenerative 11/17/2012   Bipolar disorder ([place])    Cervicalgia    Depression    Environmental allergies    cause SOB, uses inhaler for   Environmental and seasonal allergies    uses inhaler prn   Full dentures    GERD (gastroesophageal reflux disease)    diet controlled - no meds   Hyperlipidemia    diet controlled, no meds   Hypertension    Lumbar radiculopathy, chronic    Right knee pain    posterior horn medial meniscal tear MRI 2013   Spinal stenosis    getting epidural injections -last one 06/12/2015   SVD (spontaneous vaginal delivery)    x 4   Review of Systems:  Endorses sinus congestion, rhinorrhea, watery/itchy eyes. Denies cough, wheezing, shortness of breath, chest pain, abdominal pain, nausea, vomiting, diarrhea.  Physical Exam:  Vitals:   07/24/20 1027  BP: 131/84  Pulse: 65  Temp: 98.5 F (36.9 C)  TempSrc: Oral  SpO2: 100%  Weight: 187 lb 4.8 oz (85 kg)  Height: 5\' 5"  (1.651 m)   Physical Exam HENT:     Head:     Comments: Mild tenderness to palpation of maxillary sinuses    Nose: Congestion present. No rhinorrhea.     Mouth/Throat:     Mouth: Mucous membranes are moist.     Pharynx: Oropharynx is clear.  Eyes:     Extraocular Movements: Extraocular movements intact.     Conjunctiva/sclera: Conjunctivae normal.  Cardiovascular:     Rate and Rhythm: Normal rate and regular rhythm.     Pulses: Normal pulses.     Heart sounds: Normal heart sounds.  Pulmonary:     Effort: Pulmonary effort is normal.     Breath sounds: Normal breath sounds.  Abdominal:      General: Abdomen is flat. Bowel sounds are normal.     Palpations: Abdomen is soft.     Tenderness: There is no abdominal tenderness.   Assessment & Plan:   See Encounters Tab for problem based charting.  Patient discussed with Dr. Philipp Ovens

## 2020-07-24 NOTE — Assessment & Plan Note (Signed)
Blood pressure well controlled today. -Continue amlodipine 5mg  daily -Continue lisinopril-HCTZ 20-12.5mg  daily

## 2020-07-24 NOTE — Assessment & Plan Note (Signed)
Colonoscopy referral placed

## 2020-07-25 ENCOUNTER — Ambulatory Visit: Payer: Medicare HMO | Admitting: Behavioral Health

## 2020-07-25 DIAGNOSIS — F419 Anxiety disorder, unspecified: Secondary | ICD-10-CM

## 2020-07-25 DIAGNOSIS — F331 Major depressive disorder, recurrent, moderate: Secondary | ICD-10-CM

## 2020-07-25 DIAGNOSIS — Z636 Dependent relative needing care at home: Secondary | ICD-10-CM

## 2020-07-25 NOTE — Progress Notes (Signed)
Internal Medicine Clinic Attending  Case discussed with Dr. Johnson  At the time of the visit.  We reviewed the resident's history and exam and pertinent patient test results.  I agree with the assessment, diagnosis, and plan of care documented in the resident's note.  

## 2020-07-25 NOTE — BH Specialist Note (Signed)
Integrated Behavioral Health via Telemedicine Visit  07/25/2020 Olivia Ewing [id number]  Number of [place] visits: 5/6 Session Start time: 11:00am  Session End time: 11:30am Total time: 30  Referring Provider: Dr. [name], MD Patient/Family location: Pt in private [facility] Provider location: [facility] All persons participating in visit: Pt & Clinician Types of Service: Individual psychotherapy  I connected with Olivia Ewing and/or Olivia Ewing's  self  via  Telephone or Video Enabled Telemedicine Application  (Video is Caregility application) and verified that I am speaking with the correct person using two identifiers. Discussed confidentiality: Yes   I discussed the limitations of telemedicine and the availability of in person appointments.  Discussed there is a possibility of technology failure and discussed alternative modes of communication if that failure occurs.  I discussed that engaging in this telemedicine visit, they consent to the provision of behavioral healthcare and the services will be billed under their insurance.  Patient and/or legal guardian expressed understanding and consented to Telemedicine visit: Yes   Presenting Concerns: Patient and/or family reports the following symptoms/concerns: elevated sense of connection w/herself, the Community & her Family Duration of problem: several yrs; Severity of problem: mild to moderate  Patient and/or Family's Strengths/Protective Factors: Social connections, Social and Emotional competence, Concrete supports in place (healthy food, safe environments, etc.), Sense of purpose, and Physical Health (exercise, healthy diet, medication compliance, etc.)  Goals Addressed: Patient will:  Reduce symptoms of: anxiety and depression   Increase knowledge and/or ability of: coping skills and stress reduction   Demonstrate ability to: Increase healthy adjustment to current life  circumstances  Progress towards Goals: Ongoing  Interventions: Interventions utilized:  Supportive Counseling Standardized Assessments completed:  screeners prn  Patient and/or Family Response: Pt receptive to appt today & requests future appt  Assessment: Patient currently experiencing elevated spiritual support & self-awareness .   Patient may benefit from cont'd attn to her self-car practices, journaling & music.  Plan: Follow up with behavioral health clinician on : 2-3 wks on tele for 30 min ck-in Behavioral recommendations: f/u with the job opportunity as able, cont current practices to care for self since caregiving role is consuming Referral(s): [place] (In Clinic)  I discussed the assessment and treatment plan with the patient and/or parent/guardian. They were provided an opportunity to ask questions and all were answered. They agreed with the plan and demonstrated an understanding of the instructions.   They were advised to call back or seek an in-person evaluation if the symptoms worsen or if the condition fails to improve as anticipated.  Donnetta Hutching, LMFT

## 2020-07-26 ENCOUNTER — Encounter: Payer: Self-pay | Admitting: Surgery

## 2020-07-26 MED ORDER — BUPIVACAINE HCL 0.25 % IJ SOLN
0.5000 mL | INTRAMUSCULAR | Status: AC | PRN
Start: 2020-07-20 — End: 2020-07-20
  Administered 2020-07-20: .5 mL

## 2020-07-27 ENCOUNTER — Ambulatory Visit (INDEPENDENT_AMBULATORY_CARE_PROVIDER_SITE_OTHER): Payer: Medicare HMO | Admitting: Student

## 2020-07-27 DIAGNOSIS — I10 Essential (primary) hypertension: Secondary | ICD-10-CM

## 2020-07-27 NOTE — Progress Notes (Signed)
Notified at 1:41PM that patient cancelled appointment scheduled for in-person at 2:15PM today and switched to telehealth appointment. Attempted to contact patient by telephone number listed in chart at 1:49PM, 2:18PM and 2:31PM without response.

## 2020-08-03 ENCOUNTER — Ambulatory Visit: Payer: Medicare HMO | Admitting: Surgery

## 2020-08-03 NOTE — Telephone Encounter (Signed)
Did you mean to send this to me?

## 2020-08-09 ENCOUNTER — Telehealth: Payer: Self-pay

## 2020-08-09 NOTE — Telephone Encounter (Signed)
Patient called she is requesting a rx for pain medication to be sent in, patient stated she hasn't been able to get any sleep for the last 3 days due to having excruciating pain, patient stated the brace is not helping her wrist she sated she is unable to pick anything up and is having a burning sensation call [phone]

## 2020-08-10 ENCOUNTER — Encounter: Payer: Self-pay | Admitting: Surgery

## 2020-08-10 ENCOUNTER — Other Ambulatory Visit: Payer: Self-pay

## 2020-08-10 ENCOUNTER — Ambulatory Visit (INDEPENDENT_AMBULATORY_CARE_PROVIDER_SITE_OTHER): Payer: Medicare HMO | Admitting: Surgery

## 2020-08-10 VITALS — BP 138/86 | HR 81 | Ht 65.0 in | Wt 187.3 lb

## 2020-08-10 DIAGNOSIS — M65342 Trigger finger, left ring finger: Secondary | ICD-10-CM

## 2020-08-10 DIAGNOSIS — M654 Radial styloid tenosynovitis [de Quervain]: Secondary | ICD-10-CM

## 2020-08-10 MED ORDER — BUPIVACAINE HCL 0.5 % IJ SOLN
1.0000 mL | INTRAMUSCULAR | Status: AC | PRN
  Administered 2020-08-10: 1 mL

## 2020-08-10 NOTE — Progress Notes (Signed)
Office Visit Note   Patient: Olivia Ewing           Date of Birth: 1950/05/31           MRN: [id number] Visit Date: 08/10/2020              Requested by: [name], MD [address] [place],  [place] [address] PCP: [name], DO   Assessment & Plan: Visit Diagnoses:  1. Tenosynovitis, de Quervain LEFT   2. Trigger finger, left ring finger     Plan: In hopes given patient relief of her radial wrist pain offered injection.  After patient consent left radial wrist was prepped with Betadine and first dorsal compartment Marcaine/betamethasone 1 to 1/2 injection performed.  After sitting for a couple of minutes patient reported complete relief of her radial wrist pain with anesthetic in place.  I will have patient follow-up with Dr. Louanne Skye in 4 weeks for recheck.  If she has not had relief of her radial wrist pain or her trigger finger symptoms return he will likely discuss surgical intervention at that time for 1 or both.  Follow-Up Instructions: Return in about 4 weeks (around 09/07/2020) for With Dr. Louanne Skye for recheck left de Quervain's and trigger finger.  Possible discussion of surgery.   Orders:  Orders Placed This Encounter  Procedures   Hand/UE Inj   No orders of the defined types were placed in this encounter.     Procedures: Hand/UE Inj: L extensor compartment 1 for de Quervain's tenosynovitis on 08/10/2020 2:11 PM Details: 25 G needle, radial approach Medications: 1 mL bupivacaine 0.5 % Outcome: tolerated well, no immediate complications  Marcaine/betamethasone 1-1/2 first dorsal compartment injection performed Consent was given by the patient. Patient was prepped and draped in the usual sterile fashion.      Clinical Data: No additional findings.   Subjective: Chief Complaint  Patient presents with   Left Wrist - Follow-up    HPI 70 year old black female returns for recheck of her left wrist de Quervain's and trigger finger.  States that previous  trigger finger injection did really well.  She is no longer having problems there.  She continues to complain of left radial wrist pain and swelling.  Pain when she is gripping objects. Review of Systems No complaints of cardiac pulmonary GI GU issues  Objective: Vital Signs: BP 138/86   Pulse 81   Ht 5\' 5"  (1.651 m)   Wt 187 lb 4.8 oz (85 kg)   BMI 31.17 kg/m   Physical Exam HENT:     Head: Normocephalic.  Pulmonary:     Effort: No respiratory distress.  Musculoskeletal:     Comments: Left wrist she does have some radial swelling.  She is exquisitely tender over the first dorsal compartment.  Positive Finkelstein's test.  Neurological:     Mental Status: She is alert.    Ortho Exam  Specialty Comments:  No specialty comments available.  Imaging: No results found.   PMFS History: Patient Active Problem List   Diagnosis Date Noted   Insomnia 06/08/2020   Septic arthritis ([place]) 02/25/2020   Medication monitoring encounter 02/25/2020   S/P arthroscopy of right shoulder 01/05/2020   Carpal tunnel syndrome, left upper limb 10/25/2019    Class: Chronic   Carpal tunnel syndrome, right upper limb 10/25/2019    Class: Chronic   Tendinopathy of right biceps tendon 07/27/2019   Breast cancer screening by mammogram 06/07/2019   Seasonal allergic rhinitis due to  pollen 06/07/2019   Colon cancer screening 06/07/2019   Spinal stenosis, lumbar region, with neurogenic claudication 03/16/2019    Class: Chronic   Acute stress reaction 09/25/2018   Vitamin D insufficiency 11/25/2013   Healthcare maintenance 11/25/2013   Depression 09/23/2011   Functional incontinence 10/18/2010   Iron deficiency anemia 05/12/2008   Hyperlipemia 06/18/2007   Essential hypertension 05/20/2007   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   Bipolar disorder ([place])     Cervicalgia    Depression    Environmental allergies    cause SOB, uses inhaler for   Environmental and seasonal allergies    uses inhaler prn   Full dentures    GERD (gastroesophageal reflux disease)    diet controlled - no meds   Hyperlipidemia    diet controlled, no meds   Hypertension    Left knee DJD, degenerative meniscus tear 04/06/2012   Steroid injections: 09/2013 12/2013    Lumbar radiculopathy, chronic    Nontraumatic incomplete tear of right rotator cuff 07/27/2019   Primary osteoarthritis of right hip 04/27/2018   Right knee meniscal tear 03/14/2011   Right knee pain    posterior horn medial meniscal tear MRI 2013   Spinal stenosis    getting epidural injections -last one 06/12/2015   Status post lumbar laminectomy 03/16/2019   Status post total hip replacement, 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   SVD (spontaneous vaginal delivery)    x 4    Family History  Problem Relation Age of Onset   Hypertension Mother     Past Surgical History:  Procedure Laterality Date   APPENDECTOMY     Bladder tack  10/2006   cystocoele   BREAST SURGERY Right    benign cyst   CARPAL TUNNEL RELEASE Left 10/25/2019   Procedure: LEFT CARPAL TUNNEL RELEASE;  Surgeon: Jessy Oto, MD;  Location: [facility];  Service: Orthopedics;  Laterality: Left;   COLONOSCOPY     Hysterectomy other     IRRIGATION AND DEBRIDEMENT SHOULDER Right 02/21/2020   Procedure: IRRIGATION AND DEBRIDEMENT RIGHT SHOULDER;  Surgeon: Leandrew Koyanagi, MD;  Location: [facility];  Service: Orthopedics;  Laterality: Right;   LUMBAR LAMINECTOMY N/A 03/16/2019   Procedure: CENTRAL LAMINECTOMIES L2-3, L3-4 AND L4-5;  Surgeon: Jessy Oto, MD;  Location: [facility];  Service: Orthopedics;  Laterality: N/A;   TOTAL HIP ARTHROPLASTY Right 04/27/2018   Procedure: RIGHT TOTAL HIP ARTHROPLASTY ANTERIOR APPROACH;  Surgeon: Leandrew Koyanagi, MD;  Location: [facility];  Service: Orthopedics;  Laterality: Right;   TUBAL LIGATION      Social History   Occupational History   Not on file  Tobacco Use   Smoking status: Former    Packs/day: 0.10    Pack years: 0.00    Types: Cigarettes    Quit date: 11/26/1983    Years since quitting: 36.7   Smokeless tobacco: Never  Vaping Use   Vaping Use: Never used  Substance and Sexual Activity   Alcohol use: Yes    Alcohol/week: 0.0 standard drinks    Comment: rarely   Drug use: Not Currently    Types: Marijuana    Comment: last used 2019   Sexual activity: Not Currently    Birth control/protection: Surgical

## 2020-08-12 ENCOUNTER — Other Ambulatory Visit: Payer: Self-pay | Admitting: Internal Medicine

## 2020-08-12 DIAGNOSIS — J301 Allergic rhinitis due to pollen: Secondary | ICD-10-CM

## 2020-08-15 ENCOUNTER — Encounter: Payer: Self-pay | Admitting: *Deleted

## 2020-08-16 ENCOUNTER — Ambulatory Visit: Payer: Medicare HMO | Admitting: Behavioral Health

## 2020-08-16 ENCOUNTER — Other Ambulatory Visit: Payer: Self-pay | Admitting: Radiology

## 2020-08-16 DIAGNOSIS — F331 Major depressive disorder, recurrent, moderate: Secondary | ICD-10-CM

## 2020-08-16 DIAGNOSIS — F419 Anxiety disorder, unspecified: Secondary | ICD-10-CM

## 2020-08-16 NOTE — Telephone Encounter (Signed)
Called and left a VM advising patient of message below.

## 2020-08-16 NOTE — Telephone Encounter (Signed)
Patient requesting something for pain, she has an appt on 09/15/20@10am 

## 2020-08-16 NOTE — BH Specialist Note (Signed)
Integrated Behavioral Health via Telemedicine Visit  08/16/2020 Olivia Ewing [id number]  Number of [place] visits: 6/6 Session Start time: 11:20am  Session End time: 12:00pm Total time: 40   Referring Provider: DR. [name], MD Patient/Family location: Pt @ home in private [facility] Provider location: [facility] Office All persons participating in visit: Pt & Clinician Types of Service: Individual psychotherapy  I connected with Olivia Ewing and/or Olivia Ewing's  self  via  Telephone or Video Enabled Telemedicine Application  (Video is Caregility application) and verified that I am speaking with the correct person using two identifiers. Discussed confidentiality: Yes   I discussed the limitations of telemedicine and the availability of in person appointments.  Discussed there is a possibility of technology failure and discussed alternative modes of communication if that failure occurs.  I discussed that engaging in this telemedicine visit, they consent to the provision of behavioral healthcare and the services will be billed under their insurance.  Patient and/or legal guardian expressed understanding and consented to Telemedicine visit: Yes   Presenting Concerns: Patient and/or family reports the following symptoms/concerns: elevated degree of , "doing great!" Duration of problem: months; Severity of problem: mild  Patient and/or Family's Strengths/Protective Factors: Social connections, Social and Emotional competence, Concrete supports in place (healthy food, safe environments, etc.), and Sense of purpose  Goals Addressed: Patient will:  Reduce symptoms of: anxiety and depression   Increase knowledge and/or ability of: coping skills   Demonstrate ability to: Increase healthy adjustment to current life circumstances  Progress towards Goals: Ongoing  Interventions: Interventions utilized:  Solution-Focused Strategies, Behavioral Activation, and  Supportive Counseling Standardized Assessments completed:  screeners prn  Patient and/or Family Response: Pt receptive today & requests future appts  Assessment: Patient currently experiencing improved mental health wellness due to her new connections w/networking relationships through an Mining engineer she has met. 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   Patient may benefit from cont'd encouragement to do her work w/others who also care about this population in [employer].  Plan: Follow up with behavioral health clinician on : 2-3 wks on telehealth for 30 min Behavioral recommendations: None @ this time Referral(s): [place] (In Clinic)  I discussed the assessment and treatment plan with the patient and/or parent/guardian. They were provided an opportunity to ask questions and all were answered. They agreed with the plan and demonstrated an understanding of the instructions.   They were advised to call back or seek an in-person evaluation if the symptoms worsen or if the condition fails to improve as anticipated.  Donnetta Hutching, LMFT

## 2020-08-17 MED ORDER — HYDROCODONE-ACETAMINOPHEN 5-325 MG PO TABS: 1.0000 | ORAL_TABLET | Freq: Four times a day (QID) | ORAL | 0 refills | Status: DC | PRN

## 2020-08-22 ENCOUNTER — Encounter: Payer: Self-pay | Admitting: Internal Medicine

## 2020-08-22 ENCOUNTER — Encounter: Payer: Self-pay | Admitting: Specialist

## 2020-08-22 DIAGNOSIS — M654 Radial styloid tenosynovitis [de Quervain]: Secondary | ICD-10-CM | POA: Insufficient documentation

## 2020-08-23 ENCOUNTER — Other Ambulatory Visit: Payer: Self-pay

## 2020-08-23 MED ORDER — PROMETHAZINE HCL 25 MG PO TABS: 25.0000 mg | ORAL_TABLET | Freq: Three times a day (TID) | ORAL | 0 refills | Status: DC | PRN

## 2020-08-23 NOTE — Telephone Encounter (Signed)
promethazine (PHENERGAN) 25 MG tablet (Expired, REFILL REQUEST @ [place].

## 2020-08-24 ENCOUNTER — Telehealth: Payer: Self-pay | Admitting: Specialist

## 2020-08-24 NOTE — Telephone Encounter (Signed)
Pt called stating she is in so much pain and doing the icing on and off isn't working. Pt is wanting to know if she could have anything called in to get her to her appt on 09/15/20? Pt would like a CB with answer.    [phone]

## 2020-08-29 ENCOUNTER — Other Ambulatory Visit: Payer: Self-pay | Admitting: Specialist

## 2020-08-29 ENCOUNTER — Encounter: Payer: Self-pay | Admitting: Physical Therapy

## 2020-08-29 NOTE — Telephone Encounter (Signed)
Can you please advise, Dr. Louanne Skye is out of the office this week?

## 2020-08-29 NOTE — Telephone Encounter (Signed)
Pt calling to ask where the prescription was sent to previously. Pt states that facility is no longer there and has now requested it be sent to [facility] ([place], [place]). The best call back number is [phone].

## 2020-08-29 NOTE — Telephone Encounter (Signed)
Can you send this to [facility] please

## 2020-08-29 NOTE — Telephone Encounter (Signed)
Sent message via her MyChart to advise of Jeneen Rinks message

## 2020-08-30 ENCOUNTER — Encounter: Payer: Self-pay | Admitting: Specialist

## 2020-09-01 ENCOUNTER — Other Ambulatory Visit: Payer: Self-pay

## 2020-09-01 ENCOUNTER — Other Ambulatory Visit (HOSPITAL_COMMUNITY)
Admission: RE | Admit: 2020-09-01 | Discharge: 2020-09-01 | Disposition: A | Discharge status: Home / Self Care | Payer: Medicare HMO | Source: Ambulatory Visit | Attending: Internal Medicine | Admitting: Internal Medicine

## 2020-09-01 ENCOUNTER — Ambulatory Visit (INDEPENDENT_AMBULATORY_CARE_PROVIDER_SITE_OTHER): Payer: Medicare HMO | Admitting: Internal Medicine

## 2020-09-01 VITALS — BP 144/91 | HR 81 | Temp 98.0°F | Ht 65.0 in | Wt 191.2 lb

## 2020-09-01 DIAGNOSIS — N898 Other specified noninflammatory disorders of vagina: Secondary | ICD-10-CM | POA: Insufficient documentation

## 2020-09-01 DIAGNOSIS — L298 Other pruritus: Secondary | ICD-10-CM | POA: Insufficient documentation

## 2020-09-01 DIAGNOSIS — Z1151 Encounter for screening for human papillomavirus (HPV): Secondary | ICD-10-CM | POA: Insufficient documentation

## 2020-09-01 DIAGNOSIS — Z113 Encounter for screening for infections with a predominantly sexual mode of transmission: Secondary | ICD-10-CM | POA: Insufficient documentation

## 2020-09-01 DIAGNOSIS — R87611 Atypical squamous cells cannot exclude high grade squamous intraepithelial lesion on cytologic smear of cervix (ASC-H): Secondary | ICD-10-CM | POA: Diagnosis present

## 2020-09-01 DIAGNOSIS — F430 Acute stress reaction: Secondary | ICD-10-CM

## 2020-09-01 DIAGNOSIS — F32A Depression, unspecified: Secondary | ICD-10-CM | POA: Diagnosis not present

## 2020-09-01 DIAGNOSIS — Z124 Encounter for screening for malignant neoplasm of cervix: Secondary | ICD-10-CM | POA: Insufficient documentation

## 2020-09-01 DIAGNOSIS — F419 Anxiety disorder, unspecified: Secondary | ICD-10-CM | POA: Diagnosis not present

## 2020-09-01 MED ORDER — SERTRALINE HCL 100 MG PO TABS: 100.0000 mg | ORAL_TABLET | Freq: Every day | ORAL | 2 refills | Status: DC

## 2020-09-01 NOTE — Assessment & Plan Note (Addendum)
PHQ-9 score of 17. Patient follows with behavioral health, her next appointment is next week.   -increased zoloft to '100mg'$ .  Patient has been stable on 50 mg for years, however she has recently had several factors in the last year that have increased her anxiety and depression. Currently having to move due to losing her housing, a family member recently was incarcerated, and she's had multiple surgeries in the past year. -will follow-up with this next appointment to see if medication increase was helpful

## 2020-09-01 NOTE — Addendum Note (Signed)
Addended by: Edwyna Perfect on: 09/01/2020 11:21 AM   Modules accepted: Orders

## 2020-09-01 NOTE — Patient Instructions (Signed)
[name], it was a pleasure seeing you today!  Today we discussed: Sexual health- I will call you back with the results from today.  It was nice to meet you and I'm glad you came in. Mental Health- I also increased your Zoloft to 100 mg. I'm glad you are also see counseling here, we will follow back up with you in a 2-3 months to see how you are doing.  I have ordered the following labs today:  Lab Orders  No laboratory test(s) ordered today     Tests ordered today:  None  Referrals ordered today:   Referral Orders  No referral(s) requested today     I have ordered the following medication/changed the following medications:   Stop the following medications: Medications Discontinued During This Encounter  Medication Reason   sertraline (ZOLOFT) 50 MG tablet Change in therapy     Start the following medications: Meds ordered this encounter  Medications   sertraline (ZOLOFT) 100 MG tablet    Sig: Take 1 tablet (100 mg total) by mouth daily.    Dispense:  30 tablet    Refill:  2     Follow-up: 2-3 months   Please make sure to arrive 15 minutes prior to your next appointment. If you arrive late, you may be asked to reschedule.   We look forward to seeing you next time. Please call our clinic at [phone] if you have any questions or concerns. The best time to call is Monday-Friday from 9am-4pm, but there is someone available 24/7. If after hours or the weekend, call the main hospital number and ask for the Internal Medicine Resident On-Call. If you need medication refills, please notify your pharmacy one week in advance and they will send [name] a request.  Thank you for letting us take part in your care. Wishing you the best!  Thank you, Dr. Howie Ill

## 2020-09-01 NOTE — Progress Notes (Addendum)
   CC: vaginal itchiness  HPI:  [name] is a 70 y.o. with medical history as below presenting to [facility] for vaginal itchiness and discomfort.  Please see problem-based list for further details, assessments, and plans.  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   Steroid injections: 09/2013 12/2013    Lumbar radiculopathy, chronic    Nontraumatic incomplete tear of right rotator cuff 07/27/2019   Primary osteoarthritis of right hip 04/27/2018   Right knee meniscal tear 03/14/2011   Right knee pain    posterior horn medial meniscal tear MRI 2013   Spinal stenosis    getting epidural injections -last one 06/12/2015   Status post lumbar laminectomy 03/16/2019   Status post total hip replacement, 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  General: well-developed, well-nourished, obese HENT: NCAT, no scars Eyes: no scleral icterus, conjunctiva clear CV: Normal rate, no murmurs Pulm: CTAB, normal pulmonary effort GI: No tenderness, normal bowel sounds MSK: uses a cane to walk, limited ROM in left hip Skin: no rashes or bruising GU: Normal appearing external genitalia, normal appearing vaginal mucosa and cervix.  No discharge or blood noted in vaginal vault. Pap smear and wet prep obtained.  Assessment & Plan:   See Encounters Tab for problem based  charting.  Patient seen with Dr.  Jimmye Norman

## 2020-09-04 LAB — CERVICOVAGINAL ANCILLARY ONLY
Bacterial Vaginitis (gardnerella): POSITIVE — AB
Candida Glabrata: NEGATIVE
Candida Vaginitis: POSITIVE — AB
Chlamydia: NEGATIVE
Comment: NEGATIVE
Comment: NEGATIVE
Comment: NEGATIVE
Comment: NEGATIVE
Comment: NEGATIVE
Comment: NORMAL
Neisseria Gonorrhea: NEGATIVE
Trichomonas: NEGATIVE

## 2020-09-04 MED ORDER — METRONIDAZOLE 500 MG PO TABS: 500.0000 mg | ORAL_TABLET | Freq: Two times a day (BID) | ORAL | 0 refills | Status: AC

## 2020-09-04 MED ORDER — FLUCONAZOLE 150 MG PO TABS: 150.0000 mg | ORAL_TABLET | Freq: Every day | ORAL | 0 refills | Status: DC

## 2020-09-04 NOTE — Addendum Note (Signed)
Addended by: Edwyna Perfect on: 09/04/2020 03:23 PM   Modules accepted: Orders

## 2020-09-04 NOTE — Assessment & Plan Note (Signed)
Patient states that she found out that her partner has been sexually active with multiple other people.  She is concerned that she may have an STD. -wet prep and STD testing completed -once resulted will give patient a call and treat accordingly

## 2020-09-04 NOTE — Assessment & Plan Note (Addendum)
02/2018 patient had an abnormal pap smear at her OBGYN that showed ASC-high grade. She was unsure about the follow-up since then. On chart review, I did not see other OBGYN notes since the 03/02/2018 visit.  -Pap smear performed today.  If high-grade again, will refer back to OBGYN  ADDENDUM: Results showed ASCUS.  Referred to OBGYN

## 2020-09-05 ENCOUNTER — Telehealth: Payer: Self-pay

## 2020-09-05 ENCOUNTER — Encounter: Payer: Self-pay | Admitting: Internal Medicine

## 2020-09-05 LAB — CYTOLOGY - PAP
Adequacy: ABSENT
Comment: NEGATIVE
Diagnosis: UNDETERMINED — AB
High risk HPV: POSITIVE — AB

## 2020-09-05 NOTE — Telephone Encounter (Signed)
Requesting test results, please call pt back.  

## 2020-09-07 ENCOUNTER — Ambulatory Visit: Payer: Medicare HMO | Admitting: Specialist

## 2020-09-07 ENCOUNTER — Ambulatory Visit: Payer: Medicare HMO | Admitting: Behavioral Health

## 2020-09-07 ENCOUNTER — Encounter: Payer: Self-pay | Admitting: Specialist

## 2020-09-07 NOTE — Telephone Encounter (Signed)
Patient called back for lab results. States she has appt today at 22 and another one at 2. Otherwise, she is available for Dr. Howie Ill to call her back. She will address her full VMB as well.

## 2020-09-07 NOTE — BH Specialist Note (Incomplete)
Integrated Behavioral Health via Telemedicine Visit  09/07/2020 [employer] [id number]  Number of [place] visits: *** Session Start time: ***  Session End time: *** Total time: {IBH Total [id number]  Referring Provider: *** Patient/Family location: *** [facility] Provider location: *** All persons participating in visit: *** Types of Service: {CHL AMB TYPE OF [phone]}  I connected with [name] and/or [place] {family members:20773} via  Telephone or Video Enabled Telemedicine Application  (Video is Caregility application) and verified that I am speaking with the correct person using two identifiers. Discussed confidentiality: {YES/NO:21197}  I discussed the limitations of telemedicine and the availability of in person appointments.  Discussed there is a possibility of technology failure and discussed alternative modes of communication if that failure occurs.  I discussed that engaging in this telemedicine visit, they consent to the provision of behavioral healthcare and the services will be billed under their insurance.  Patient and/or legal guardian expressed understanding and consented to Telemedicine visit: {YES/NO:21197}  Presenting Concerns: Patient and/or family reports the following symptoms/concerns: *** Duration of problem: ***; Severity of problem: {Mild/Moderate/Severe:20260}  Patient and/or Family's Strengths/Protective Factors: {CHL AMB BH PROTECTIVE FACTORS:[phone]}  Goals Addressed: Patient will:  Reduce symptoms of: {IBH Symptoms:21014056}   Increase knowledge and/or ability of: {IBH Patient Tools:21014057}   Demonstrate ability to: {IBH Goals:21014053}  Progress towards Goals: {CHL AMB BH PROGRESS TOWARDS [phone]}  Interventions: Interventions utilized:  {IBH Interventions:21014054} Standardized Assessments completed: {IBH Screening Tools:21014051}  Patient and/or Family Response:  ***  Assessment: Patient currently experiencing ***.   Patient may benefit from ***.  Plan: Follow up with behavioral health clinician on : *** Behavioral recommendations: *** Referral(s): {IBH Referrals:21014055}  I discussed the assessment and treatment plan with the patient and/or parent/guardian. They were provided an opportunity to ask questions and all were answered. They agreed with the plan and demonstrated an understanding of the instructions.   They were advised to call back or seek an in-person evaluation if the symptoms worsen or if the condition fails to improve as anticipated.  Donnetta Hutching, LMFT

## 2020-09-07 NOTE — BH Specialist Note (Incomplete)
Integrated Behavioral Health via Telemedicine Visit  09/07/2020 Olivia Ewing [id number]  Number of [place] visits: 7 Session Start time: 11:00am  Session End time: 11:50am Total time: 50   Referring Provider: Dr. [name], DO Patient/Family location: Pt is home in [facility] Provider location: *** All persons participating in visit: *** Types of Service: {CHL AMB TYPE OF SERVICE:[id number]}  I connected with Olivia Ewing and/or Olivia Ewing {family members:20773} via  Telephone or Video Enabled Telemedicine Application  (Video is Caregility application) and verified that I am speaking with the correct person using two identifiers. Discussed confidentiality: {YES/NO:21197}  I discussed the limitations of telemedicine and the availability of in person appointments.  Discussed there is a possibility of technology failure and discussed alternative modes of communication if that failure occurs.  I discussed that engaging in this telemedicine visit, they consent to the provision of behavioral healthcare and the services will be billed under their insurance.  Patient and/or legal guardian expressed understanding and consented to Telemedicine visit: {YES/NO:21197}  Presenting Concerns: Patient and/or family reports the following symptoms/concerns: *** Duration of problem: ***; Severity of problem: {Mild/Moderate/Severe:20260}  Patient and/or Family's Strengths/Protective Factors: {CHL AMB BH PROTECTIVE FACTORS:[phone]}  Goals Addressed: Patient will:  Reduce symptoms of: {IBH Symptoms:21014056}   Increase knowledge and/or ability of: {IBH Patient Tools:21014057}   Demonstrate ability to: {IBH Goals:21014053}  Progress towards Goals: {CHL AMB BH PROGRESS TOWARDS GOALS:[phone]}  Interventions: Interventions utilized:  {IBH Interventions:21014054} Standardized Assessments completed: {IBH Screening Tools:21014051}  Patient and/or Family  Response: ***  Assessment: Patient currently experiencing ***.   Patient may benefit from ***.  Plan: Follow up with behavioral health clinician on : *** Behavioral recommendations: *** Referral(s): {IBH Referrals:21014055}  I discussed the assessment and treatment plan with the patient and/or parent/guardian. They were provided an opportunity to ask questions and all were answered. They agreed with the plan and demonstrated an understanding of the instructions.   They were advised to call back or seek an in-person evaluation if the symptoms worsen or if the condition fails to improve as anticipated.  Donnetta Hutching, LMFT

## 2020-09-07 NOTE — Telephone Encounter (Signed)
Appt cancelled at patients request

## 2020-09-08 NOTE — Addendum Note (Signed)
Addended by: Edwyna Perfect on: 09/08/2020 08:14 AM   Modules accepted: Orders

## 2020-09-10 NOTE — Progress Notes (Signed)
Internal Medicine Clinic Attending  I saw and evaluated the patient.  I personally confirmed the key portions of the history and exam documented by Dr. Masters and I reviewed pertinent patient test results.  The assessment, diagnosis, and plan were formulated together and I agree with the documentation in the resident's note.  

## 2020-09-13 ENCOUNTER — Other Ambulatory Visit: Payer: Self-pay | Admitting: Internal Medicine

## 2020-09-13 DIAGNOSIS — F32A Depression, unspecified: Secondary | ICD-10-CM

## 2020-09-15 ENCOUNTER — Ambulatory Visit: Payer: Medicare HMO | Admitting: Specialist

## 2020-09-21 ENCOUNTER — Ambulatory Visit (INDEPENDENT_AMBULATORY_CARE_PROVIDER_SITE_OTHER): Payer: Medicare HMO | Admitting: Specialist

## 2020-09-21 ENCOUNTER — Encounter: Payer: Self-pay | Admitting: Specialist

## 2020-09-21 ENCOUNTER — Other Ambulatory Visit: Payer: Self-pay

## 2020-09-21 VITALS — BP 123/85 | HR 80 | Ht 65.0 in | Wt 191.2 lb

## 2020-09-21 DIAGNOSIS — M654 Radial styloid tenosynovitis [de Quervain]: Secondary | ICD-10-CM | POA: Diagnosis not present

## 2020-09-21 DIAGNOSIS — M65342 Trigger finger, left ring finger: Secondary | ICD-10-CM

## 2020-09-21 MED ORDER — LIDOCAINE HCL 1 % IJ SOLN
5.0000 mL | INTRAMUSCULAR | Status: AC | PRN
  Administered 2020-09-21: 5 mL

## 2020-09-21 MED ORDER — METHYLPREDNISOLONE ACETATE 40 MG/ML IJ SUSP
20.0000 mg | INTRAMUSCULAR | Status: AC | PRN
  Administered 2020-09-21: 20 mg

## 2020-09-21 MED ORDER — METHYLPREDNISOLONE ACETATE 40 MG/ML IJ SUSP
10.0000 mg | INTRAMUSCULAR | Status: AC | PRN
  Administered 2020-09-21: 10 mg

## 2020-09-21 NOTE — Progress Notes (Signed)
Office Visit Note   Patient: Olivia Ewing           Date of Birth: [birth year]           MRN: [id number] Visit Date: 09/21/2020              Requested by: Masters, [place], DO [address] [place],  [place] [address] PCP: [name], DO   Assessment & Plan: Visit Diagnoses:  1. Trigger finger, left ring finger   2. De Quervain's disease (tenosynovitis)     Plan: Ice to the left wrist and hand as needed 15-20 min as a time. Refer to hand therapy (occupational therapy for hand exercises and return next Monday for  Blood work for assessment to be sure there is no Rheumatoid arthritis or autoimmune disease   Follow-Up Instructions: No follow-ups on file.   Orders:  Orders Placed This Encounter  Procedures   Hand/UE Inj: L ring A1   Hand/UE Inj   No orders of the defined types were placed in this encounter.     Procedures: Hand/UE Inj: L ring A1 for trigger finger on 09/21/2020 9:33 AM Details: volar approach Medications: 5 mL lidocaine 1 %; 20 mg methylPREDNISolone acetate 40 MG/ML Immediately prior to procedure a time out was called to verify the correct patient, procedure, equipment, support staff and site/side marked as required. Patient was prepped and draped in the usual sterile fashion.    Hand/UE Inj for de Quervain's tenosynovitis on 09/21/2020 9:35 AM Indications: pain and tendon swelling Details: 27 G needle, radial approach Medications: 5 mL lidocaine 1 %; 10 mg methylPREDNISolone acetate 40 MG/ML Aspirate: clear Procedure, treatment alternatives, risks and benefits explained, specific risks discussed. Immediately prior to procedure a time out was called to verify the correct patient, procedure, equipment, support staff and site/side marked as required. Patient was prepped and draped in the usual sterile fashion.     Clinical Data: No additional findings.   Subjective: Chief Complaint  Patient presents with   Left Wrist - Pain   Left Ring  Finger - Pain    Sticking again    70 year old right handed female with 1 month historyof left wrist radial pain burning sensation and catching of the left ring finger. Pain with ROM and catching with flexion and extension of the left ring finger. No neck pain or stiffness no pain above the left wrist. Works as Quarry manager at [employer]. Pain is becoming more extreme.   Review of Systems  Constitutional: Negative.   HENT:  Positive for congestion, postnasal drip, rhinorrhea, sinus pressure and sinus pain.   Eyes: Negative.   Respiratory: Negative.    Cardiovascular: Negative.   Gastrointestinal: Negative.   Endocrine: Negative.   Genitourinary: Negative.   Musculoskeletal: Negative.   Skin: Negative.   Allergic/Immunologic: Negative.   Neurological: Negative.   Hematological: Negative.   Psychiatric/Behavioral: Negative.      Objective: Vital Signs: BP 123/85 (BP Location: Left Arm, Patient Position: Sitting)   Pulse 80   Ht '5\' 5"'$  (1.651 m)   Wt 191 lb 3.2 oz (86.7 kg)   BMI 31.82 kg/m   Physical Exam Constitutional:      Appearance: She is well-developed.  HENT:     Head: Normocephalic and atraumatic.  Eyes:     Pupils: Pupils are equal, round, and reactive to light.  Pulmonary:     Effort: Pulmonary effort is normal.     Breath sounds: Normal breath sounds.  Abdominal:     General: Bowel sounds are normal.     Palpations: Abdomen is soft.  Musculoskeletal:        General: Normal range of motion.     Cervical back: Normal range of motion and neck supple.  Skin:    General: Skin is warm and dry.  Neurological:     Mental Status: She is alert and oriented to person, place, and time.  Psychiatric:        Behavior: Behavior normal.        Thought Content: Thought content normal.        Judgment: Judgment normal.   Left Hand Exam   Tenderness  The patient is experiencing tenderness in the dorsal area and radial area.   Range of Motion  Wrist  Extension:   normal  Flexion:  normal  Pronation:  normal   Muscle Strength  Wrist extension: 5/5  Wrist flexion: 5/5  Grip:  5/5   Tests  Finkelstein's test: positive  Other  Erythema: absent Scars: absent Sensation: normal Pulse: present  Comments:  Positive Finkelstein's test left 1st dorsal compartment. Left ring finger with tenderness left A1 pulley level.     Specialty Comments:  No specialty comments available.  Imaging: No results found.   PMFS History: Patient Active Problem List   Diagnosis Date Noted   Carpal tunnel syndrome, left upper limb 10/25/2019    Priority: High    Class: Chronic   Spinal stenosis, lumbar region, with neurogenic claudication 03/16/2019    Priority: High    Class: Chronic   Carpal tunnel syndrome, right upper limb 10/25/2019    Priority: Low    Class: Chronic   Atypical squamous cells cannot exclude high grade squamous intraepithelial lesion on cytologic smear of cervix (ASC-H) 09/01/2020   Vaginal itching 09/01/2020   De Quervain's disease (tenosynovitis) 08/22/2020   Insomnia 06/08/2020   Septic arthritis ([facility]) 02/25/2020   Medication monitoring encounter 02/25/2020   S/P arthroscopy of right shoulder 01/05/2020   Tendinopathy of right biceps tendon 07/27/2019   Breast cancer screening by mammogram 06/07/2019   Seasonal allergic rhinitis due to pollen 06/07/2019   Colon cancer screening 06/07/2019   Acute stress reaction 09/25/2018   Vitamin D insufficiency 11/25/2013   Healthcare maintenance 11/25/2013   Depression 09/23/2011   Functional incontinence 10/18/2010   Iron deficiency anemia 05/12/2008   Hyperlipemia 06/18/2007   Essential hypertension 05/20/2007   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   Bipolar disorder ([facility])    Cervicalgia    Depression    Environmental allergies    cause SOB,  uses inhaler for   Environmental and seasonal allergies    uses inhaler prn   Full dentures    GERD (gastroesophageal reflux disease)    diet controlled - no meds   Hyperlipidemia    diet controlled, no meds   Hypertension    Left knee DJD, degenerative meniscus tear 04/06/2012   Steroid injections: 09/2013 12/2013    Lumbar radiculopathy, chronic    Nontraumatic incomplete tear of right rotator cuff 07/27/2019   Primary osteoarthritis of right hip 04/27/2018   Right knee meniscal tear 03/14/2011   Right knee pain    posterior horn medial meniscal tear MRI 2013   Spinal stenosis    getting epidural injections -last one 06/12/2015   Status post  lumbar laminectomy 03/16/2019   Status post total hip replacement, 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   SVD (spontaneous vaginal delivery)    x 4    Family History  Problem Relation Age of Onset   Hypertension Mother     Past Surgical History:  Procedure Laterality Date   APPENDECTOMY     Bladder tack  10/2006   cystocoele   BREAST SURGERY Right    benign cyst   CARPAL TUNNEL RELEASE Left 10/25/2019   Procedure: LEFT CARPAL TUNNEL RELEASE;  Surgeon: Jessy Oto, MD;  Location: [facility];  Service: Orthopedics;  Laterality: Left;   COLONOSCOPY     Hysterectomy other     IRRIGATION AND DEBRIDEMENT SHOULDER Right 02/21/2020   Procedure: IRRIGATION AND DEBRIDEMENT RIGHT SHOULDER;  Surgeon: Leandrew Koyanagi, MD;  Location: [facility];  Service: Orthopedics;  Laterality: Right;   LUMBAR LAMINECTOMY N/A 03/16/2019   Procedure: CENTRAL LAMINECTOMIES L2-3, L3-4 AND L4-5;  Surgeon: Jessy Oto, MD;  Location: [facility];  Service: Orthopedics;  Laterality: N/A;   TOTAL HIP ARTHROPLASTY Right 04/27/2018   Procedure: RIGHT TOTAL HIP ARTHROPLASTY ANTERIOR APPROACH;  Surgeon: Leandrew Koyanagi, MD;  Location: [facility];  Service: Orthopedics;  Laterality: Right;   TUBAL LIGATION     Social History   Occupational History   Not on file  Tobacco Use    Smoking status: Former    Packs/day: 0.10    Types: Cigarettes    Quit date: 11/26/1983    Years since quitting: 36.8   Smokeless tobacco: Never  Vaping Use   Vaping Use: Never used  Substance and Sexual Activity   Alcohol use: Yes    Alcohol/week: 0.0 standard drinks    Comment: rarely   Drug use: Not Currently    Types: Marijuana    Comment: last used 2019   Sexual activity: Not Currently    Birth control/protection: Surgical

## 2020-09-21 NOTE — Patient Instructions (Signed)
Plan: Ice to the left wrist and hand as needed 15-20 min as a time. Refer to hand therapy (occupational therapy for hand exercises and return next Monday for  Blood work for assessment to be sure there is no Rheumatoid arthritis or autoimmune disease

## 2020-09-26 ENCOUNTER — Encounter: Payer: Self-pay | Admitting: Internal Medicine

## 2020-09-26 ENCOUNTER — Telehealth: Payer: Self-pay | Admitting: Behavioral Health

## 2020-09-26 ENCOUNTER — Ambulatory Visit: Payer: Medicare HMO | Admitting: Behavioral Health

## 2020-09-26 NOTE — Telephone Encounter (Signed)
Pt contacted for 10:00am appt per phone contact. Directed Pt to r/s prn.   Dr. Theodis Shove

## 2020-09-28 ENCOUNTER — Encounter: Payer: Self-pay | Admitting: Gastroenterology

## 2020-10-02 ENCOUNTER — Other Ambulatory Visit: Payer: Self-pay | Admitting: Internal Medicine

## 2020-10-05 ENCOUNTER — Other Ambulatory Visit: Payer: Self-pay

## 2020-10-05 ENCOUNTER — Telehealth: Payer: Self-pay | Admitting: Behavioral Health

## 2020-10-05 ENCOUNTER — Telehealth: Payer: Self-pay | Admitting: Internal Medicine

## 2020-10-05 ENCOUNTER — Institutional Professional Consult (permissible substitution): Payer: Medicare HMO | Admitting: Behavioral Health

## 2020-10-05 NOTE — Telephone Encounter (Signed)
Lft 2 msg for Pt re: telehealth visit today @ 9:00am. Requested Pt call to r/s @ her convenience.  Dr. Theodis Shove

## 2020-10-05 NOTE — Telephone Encounter (Signed)
promethazine (PHENERGAN) 25 MG tablet (Expired), refill request @ [facility], [place].

## 2020-10-09 ENCOUNTER — Encounter: Payer: Medicare HMO | Admitting: Internal Medicine

## 2020-10-09 ENCOUNTER — Ambulatory Visit: Payer: Medicare HMO

## 2020-10-10 ENCOUNTER — Encounter: Payer: Medicare HMO | Admitting: Internal Medicine

## 2020-10-17 ENCOUNTER — Telehealth: Payer: Self-pay | Admitting: *Deleted

## 2020-10-17 NOTE — Telephone Encounter (Signed)
Patient no show PV today. Patient was called multiple times by myself & scheduler. No answer, a pre recorded message played with no voice mail. Letter mailed to patient and colon and PV cancelled.

## 2020-10-18 ENCOUNTER — Other Ambulatory Visit: Payer: Self-pay

## 2020-10-18 ENCOUNTER — Other Ambulatory Visit: Payer: Self-pay | Admitting: Specialist

## 2020-10-18 NOTE — Telephone Encounter (Signed)
Pt requesting her promethazine (PHENERGAN) 25 MG tablet (Expired) sent to  [facility], [place] Phone:  [phone]  Fax:  [phone]

## 2020-10-19 MED ORDER — PROMETHAZINE HCL 25 MG PO TABS: 25.0000 mg | ORAL_TABLET | Freq: Three times a day (TID) | ORAL | 0 refills | Status: DC | PRN

## 2020-10-23 ENCOUNTER — Other Ambulatory Visit: Payer: Self-pay | Admitting: Radiology

## 2020-10-24 ENCOUNTER — Ambulatory Visit: Payer: Medicare HMO | Admitting: Behavioral Health

## 2020-10-24 ENCOUNTER — Encounter: Payer: Medicare HMO | Admitting: Internal Medicine

## 2020-10-24 ENCOUNTER — Encounter: Payer: Self-pay | Admitting: Internal Medicine

## 2020-10-24 ENCOUNTER — Ambulatory Visit (INDEPENDENT_AMBULATORY_CARE_PROVIDER_SITE_OTHER): Payer: Medicare HMO | Admitting: Internal Medicine

## 2020-10-24 DIAGNOSIS — F32A Depression, unspecified: Secondary | ICD-10-CM | POA: Diagnosis not present

## 2020-10-24 DIAGNOSIS — I10 Essential (primary) hypertension: Secondary | ICD-10-CM | POA: Diagnosis not present

## 2020-10-24 DIAGNOSIS — F4323 Adjustment disorder with mixed anxiety and depressed mood: Secondary | ICD-10-CM

## 2020-10-24 DIAGNOSIS — G4700 Insomnia, unspecified: Secondary | ICD-10-CM

## 2020-10-24 MED ORDER — DICLOFENAC SODIUM 75 MG PO TBEC: 75.0000 mg | DELAYED_RELEASE_TABLET | Freq: Two times a day (BID) | ORAL | 2 refills | Status: DC

## 2020-10-24 MED ORDER — QUETIAPINE FUMARATE 100 MG PO TABS: 100.0000 mg | ORAL_TABLET | Freq: Every day | ORAL | 3 refills | Status: DC

## 2020-10-24 NOTE — BH Specialist Note (Signed)
Integrated Behavioral Health via Telemedicine Visit  10/24/2020 SEVANA SEAWARD [id number]  Number of [place] visits: 7 Session Start time: 11:30am  Session End time: 12:00pm Total time: 30  Referring Provider: Dr. [name], MD Patient/Family location: Pt is home in private [facility] Provider location: [facility] Office All persons participating in visit: Pt & Clinician Types of Service: Individual psychotherapy  I connected with [name] and/or Drucilla Schmidt Topor's  self  via  Telephone or Video Enabled Telemedicine Application  (Video is Caregility application) and verified that I am speaking with the correct person using two identifiers. Discussed confidentiality:  7th visit  I discussed the limitations of telemedicine and the availability of in person appointments.  Discussed there is a possibility of technology failure and discussed alternative modes of communication if that failure occurs.  I discussed that engaging in this telemedicine visit, they consent to the provision of behavioral healthcare and the services will be billed under their insurance.  Patient and/or legal guardian expressed understanding and consented to Telemedicine visit:  7th visit  Presenting Concerns: Patient and/or family reports the following symptoms/concerns: elevated anx/dep due to forced need to find housing due to Aunt's mis-allocation of funds from [place]. Pt's Son is coming home from 37 yrs in prison in Oct. 2022. Duration of problem: years; Severity of problem: moderate  Patient and/or Family's Strengths/Protective Factors: Social connections and Social and Emotional competence  Goals Addressed: Patient will:  Reduce symptoms of: anxiety, depression, stress, and sudden, unexpected housing insecurity  & loss of job  Increase knowledge and/or ability of: coping skills and stress reduction   Demonstrate ability to: Increase healthy adjustment to current life circumstances,  Increase adequate support systems for patient/family, and Begin healthy grieving over loss  Progress towards Goals: Ongoing  Interventions: Interventions utilized:  Solution-Focused Strategies and Supportive Counseling Standardized Assessments completed:  screeners prn  Patient and/or Family Response: Pt is receptive to call & very upset due to recent loss of Relative's home she has lived in for 10 yrs w/her Aunt who has handled the [place] badly.  Assessment: Patient currently experiencing elevated anx/dep & despair due to current living circumstances she has to address prior to the end of the year. She is also faced w/the return of her Son from 43 yrs of inprisonment; he has many needs due to his felony Tourist information centre manager.  Patient may benefit from cont'd support for her situation getting her housing secured & providing her Son w/firm boundaries about her help.  Plan: Follow up with behavioral health clinician on : 2-3 wks for 60 min on telehealth Behavioral recommendations: [place] fllw through on your connections @ [place] for resources & a [facility]. Referral(s): [facility] (In Clinic)  I discussed the assessment and treatment plan with the patient and/or parent/guardian. They were provided an opportunity to ask questions and all were answered. They agreed with the plan and demonstrated an understanding of the instructions.   They were advised to call back or seek an in-person evaluation if the symptoms worsen or if the condition fails to improve as anticipated.  Donnetta Hutching, LMFT

## 2020-10-24 NOTE — Addendum Note (Signed)
Addended by: Maudie Mercury C on: 10/24/2020 02:19 PM   Modules accepted: Orders, Level of Service

## 2020-10-24 NOTE — Assessment & Plan Note (Addendum)
Patient with a history of insomnia presents requesting alternative medications as her 50 mg Seroquel are not working.  She states that she recently was at a relatives house and tried half a tab of 1 mg of Valium, which helped her relax and helped her sleep.  We discussed the advantages and disadvantages of benzodiazepines especially in the elderly population.  Patient voiced understanding.  We did discuss increasing her Seroquel to 100 mg nightly, which patient will start tonight.  When discussing sleep hygiene she does sleep in a cool room with a fan, but does sleep with the television on even though the sound is on mute.  She states that she has been doing this for quite some time and has been "conditioned" to sleep like this.  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  We discussed proper sleep hygiene and patient will start sleeping with the TV off. - Work on improved sleep hygiene - Increase Seroquel to 100 mg nightly - Address at next follow-up visit

## 2020-10-24 NOTE — Progress Notes (Addendum)
  [facility] Res[facility] Telephone Encounter Continuity Care Appointment  HPI:  This telephone encounter was created for [name] on 10/24/2020 for the following purpose/cc Medication Refill and insomnia.   Past Medical History:  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   Bipolar disorder ([place])    Cervicalgia    Depression    Environmental allergies    cause SOB, uses inhaler for   Environmental and seasonal allergies    uses inhaler prn   Full dentures    GERD (gastroesophageal reflux disease)    diet controlled - no meds   Hyperlipidemia    diet controlled, no meds   Hypertension    Left knee DJD, degenerative meniscus tear 04/06/2012   Steroid injections: 09/2013 12/2013    Lumbar radiculopathy, chronic    Nontraumatic incomplete tear of right rotator cuff 07/27/2019   Primary osteoarthritis of right hip 04/27/2018   Right knee meniscal tear 03/14/2011   Right knee pain    posterior horn medial meniscal tear MRI 2013   Spinal stenosis    getting epidural injections -last one 06/12/2015   Status post lumbar laminectomy 03/16/2019   Status post total hip replacement, 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   SVD (spontaneous vaginal delivery)    x 4     ROS:  Review of Systems  Constitutional:  Negative for fever and malaise/fatigue.  Cardiovascular:  Negative for palpitations.  Gastrointestinal:  Negative for nausea and vomiting.  Psychiatric/Behavioral:  The patient has insomnia.      Assessment / Plan / Recommendations:  Please see A&P under problem oriented charting for assessment of the patient's acute and chronic medical conditions.  As always, pt is advised that if symptoms worsen or new symptoms arise, they should go to an urgent care facility or to to ER for further evaluation.   Consent and Medical Decision Making:  Patient  discussed with Dr. Daryll Drown This is a telephone encounter between Olivia Ewing and Maudie Mercury on 10/24/2020 for Medication refill/insomnia. The visit was conducted with the patient located at home and Maudie Mercury at [facility]. The patient's identity was confirmed using their DOB and current address. The patient has consented to being evaluated through a telephone encounter and understands the associated risks (an examination cannot be done and the patient may need to come in for an appointment) / benefits (allows the patient to remain at home, decreasing exposure to coronavirus). I personally spent 12 minutes on medical discussion.

## 2020-10-24 NOTE — Assessment & Plan Note (Signed)
Patient presents for telehealth follow-up for medication related to her depression.  She is currently taking Zoloft 100 mg daily, which was increased at her last visit on September 01, 2020.  She states that it is helped immensely with her depression and she does not feel as "jittery" and feels more like herself.  She also appreciates Dr. Carolynne Edouard assisting her, and finds that there sessions really help her with life stresses. - At follow-up appointment PHQ-9 - Continue Zoloft 100 mg daily - Continue to follow-up with Dr. Carolynne Edouard behavioral health

## 2020-10-26 ENCOUNTER — Encounter: Payer: Self-pay | Admitting: Specialist

## 2020-10-26 ENCOUNTER — Ambulatory Visit (INDEPENDENT_AMBULATORY_CARE_PROVIDER_SITE_OTHER): Payer: Medicare HMO | Admitting: Specialist

## 2020-10-26 ENCOUNTER — Other Ambulatory Visit: Payer: Self-pay

## 2020-10-26 ENCOUNTER — Ambulatory Visit: Payer: Medicare HMO

## 2020-10-26 VITALS — BP 153/91 | HR 68 | Ht 65.0 in | Wt 191.0 lb

## 2020-10-26 DIAGNOSIS — M654 Radial styloid tenosynovitis [de Quervain]: Secondary | ICD-10-CM | POA: Diagnosis not present

## 2020-10-26 DIAGNOSIS — G5603 Carpal tunnel syndrome, bilateral upper limbs: Secondary | ICD-10-CM

## 2020-10-26 DIAGNOSIS — M25432 Effusion, left wrist: Secondary | ICD-10-CM

## 2020-10-26 DIAGNOSIS — Z9889 Other specified postprocedural states: Secondary | ICD-10-CM

## 2020-10-26 DIAGNOSIS — M65342 Trigger finger, left ring finger: Secondary | ICD-10-CM

## 2020-10-26 MED ORDER — HYDROCODONE-ACETAMINOPHEN 5-325 MG PO TABS: 1.0000 | ORAL_TABLET | Freq: Four times a day (QID) | ORAL | 0 refills | Status: DC | PRN

## 2020-10-26 NOTE — Patient Instructions (Addendum)
Carpal Tunnel Syndrome  Carpal tunnel syndrome is a condition that causes pain in your hand and arm. The carpal tunnel is a narrow area located on the palm side of your wrist. Repeated wrist motion or certain diseases may cause swelling within the tunnel. This swelling pinches the main nerve in the wrist (median nerve). What are the causes? This condition may be caused by: Repeated wrist motions. Wrist injuries. Arthritis. A cyst or tumor in the carpal tunnel. Fluid buildup during pregnancy. Sometimes the cause of this condition is not known. What increases the risk? This condition is more likely to develop in: People who have jobs that cause them to repeatedly move their wrists in the same motion, such as Art gallery manager. Women. People with certain conditions, such as: Diabetes. Obesity. An underactive thyroid (hypothyroidism). Kidney failure. What are the signs or symptoms? Symptoms of this condition include: A tingling feeling in your fingers, especially in your thumb, index, and middle fingers. Tingling or numbness in your hand. An aching feeling in your entire arm, especially when your wrist and elbow are bent for long periods of time. Wrist pain that goes up your arm to your shoulder. Pain that goes down into your palm or fingers. A weak feeling in your hands. You may have trouble grabbing and holding items. Your symptoms may feel worse during the night. How is this diagnosed? This condition is diagnosed with a medical history and physical exam. You may also have tests, including: An electromyogram (EMG). This test measures electrical signals sent by your nerves into the muscles. X-rays. How is this treated? Treatment for this condition includes: Lifestyle changes. It is important to stop doing or modify the activity that caused your condition. Physical or occupational therapy. Medicines for pain and inflammation. This may include medicine that is injected into your  wrist. A wrist splint. Surgery. Follow these instructions at home: If you have a splint:  Wear it as told by your health care provider. Remove it only as told by your health care provider. Loosen the splint if your fingers become numb and tingle, or if they turn cold and blue. Keep the splint clean and dry. General instructions  Take over-the-counter and prescription medicines only as told by your health care provider. Rest your wrist from any activity that may be causing your pain. If your condition is work related, talk to your employer about changes that can be made, such as getting a wrist pad to use while typing. If directed, apply ice to the painful area: Put ice in a plastic bag. Place a towel between your skin and the bag. Leave the ice on for 20 minutes, 2-3 times per day. Keep all follow-up visits as told by your health care provider. This is important. Do any exercises as told by your health care provider, physical therapist, or occupational therapist. Contact a health care provider if: You have new symptoms. Your pain is not controlled with medicines. Your symptoms get worse. This information is not intended to replace advice given to you by your health care provider. Make sure you discuss any questions you have with your health care provider. Document Released: 01/26/2000 Document Revised: 06/08/2015 Document Reviewed: 10/09/2016 Elsevier Interactive Patient Education  2017 [place] with the use of diclofenac. Ice or heat for the arms as needed to relieve pain. Laboratory arthritis panel is drawn today.  Referral to Dr. Tempie Donning for right CTR and left dequervain release.

## 2020-10-26 NOTE — Progress Notes (Signed)
Internal Medicine Clinic Attending  Case discussed with Dr. Gilford Rile  At the time of the visit.  We reviewed the resident's history and pertinent patient test results.  I agree with the assessment, diagnosis, and plan of care documented in the resident's note.

## 2020-10-26 NOTE — Progress Notes (Signed)
Office Visit Note   Patient: Olivia Ewing           Date of Birth: [birth year]           MRN: [id number] Visit Date: 10/26/2020              Requested by: Masters, [place], DO [address] [place],  [place] [address] PCP: [name], DO   Assessment & Plan: Visit Diagnoses:  1. De Quervain's disease (tenosynovitis)   2. Trigger finger, left ring finger   3. Bilateral carpal tunnel syndrome   4. S/P carpal tunnel release     Plan: Carpal Tunnel Syndrome  Carpal tunnel syndrome is a condition that causes pain in your hand and arm. The carpal tunnel is a narrow area located on the palm side of your wrist. Repeated wrist motion or certain diseases may cause swelling within the tunnel. This swelling pinches the main nerve in the wrist (median nerve). What are the causes? This condition may be caused by: Repeated wrist motions. Wrist injuries. Arthritis. A cyst or tumor in the carpal tunnel. Fluid buildup during pregnancy. Sometimes the cause of this condition is not known. What increases the risk? This condition is more likely to develop in: People who have jobs that cause them to repeatedly move their wrists in the same motion, such as Art gallery manager. Women. People with certain conditions, such as: Diabetes. Obesity. An underactive thyroid (hypothyroidism). Kidney failure. What are the signs or symptoms? Symptoms of this condition include: A tingling feeling in your fingers, especially in your thumb, index, and middle fingers. Tingling or numbness in your hand. An aching feeling in your entire arm, especially when your wrist and elbow are bent for long periods of time. Wrist pain that goes up your arm to your shoulder. Pain that goes down into your palm or fingers. A weak feeling in your hands. You may have trouble grabbing and holding items. Your symptoms may feel worse during the night. How is this diagnosed? This condition is diagnosed with a  medical history and physical exam. You may also have tests, including: An electromyogram (EMG). This test measures electrical signals sent by your nerves into the muscles. X-rays. How is this treated? Treatment for this condition includes: Lifestyle changes. It is important to stop doing or modify the activity that caused your condition. Physical or occupational therapy. Medicines for pain and inflammation. This may include medicine that is injected into your wrist. A wrist splint. Surgery. Follow these instructions at home: If you have a splint:  Wear it as told by your health care provider. Remove it only as told by your health care provider. Loosen the splint if your fingers become numb and tingle, or if they turn cold and blue. Keep the splint clean and dry. General instructions  Take over-the-counter and prescription medicines only as told by your health care provider. Rest your wrist from any activity that may be causing your pain. If your condition is work related, talk to your employer about changes that can be made, such as getting a wrist pad to use while typing. If directed, apply ice to the painful area: Put ice in a plastic bag. Place a towel between your skin and the bag. Leave the ice on for 20 minutes, 2-3 times per day. Keep all follow-up visits as told by your health care provider. This is important. Do any exercises as told by your health care provider, physical therapist, or occupational therapist. Contact a  health care provider if: You have new symptoms. Your pain is not controlled with medicines. Your symptoms get worse. This information is not intended to replace advice given to you by your health care provider. Make sure you discuss any questions you have with your health care provider. Document Released: 01/26/2000 Document Revised: 06/08/2015 Document Reviewed: 10/09/2016 Elsevier Interactive Patient Education  2017 [place] with the use of  diclofenac. Ice or heat for the arms as needed to relieve pain. Laboratory arthritis panel is drawn today.   Follow-Up Instructions: No follow-ups on file.   Orders:  No orders of the defined types were placed in this encounter.  No orders of the defined types were placed in this encounter.     Procedures: No procedures performed   Clinical Data: No additional findings.   Subjective: Chief Complaint  Patient presents with   Left Wrist - Follow-up    Helped for a while, but with grasping things she gets a pulling sensation in the wrist and a burning sensation in the wrist   Left Ring Finger - Follow-up    Trigger finger is fine now, no longer sticking.    70 year old right handed  with bilateral hand numbness and tingling, underwent left CTR with good improvement in left hand symptoms developed left dequervain's and underwent injection of the 1st dorsal compartment but this has not relieved all the pain, no arthritis panel as yet. She had left right triggering and this is resolved with injection of the ring A1 pulley. She is having discomfort that radiates proximaaly from the areas of the left dorsoradial wrist.   Review of Systems  Constitutional: Negative.   HENT: Negative.    Eyes: Negative.   Respiratory: Negative.    Cardiovascular: Negative.   Gastrointestinal: Negative.   Endocrine: Negative.   Genitourinary: Negative.   Musculoskeletal: Negative.   Skin: Negative.   Allergic/Immunologic: Negative.   Neurological: Negative.   Hematological: Negative.   Psychiatric/Behavioral: Negative.      Objective: Vital Signs: BP (!) 153/91 (BP Location: Left Arm, Patient Position: Sitting)   Pulse 68   Ht '5\' 5"'$  (1.651 m)   Wt 191 lb (86.6 kg)   BMI 31.78 kg/m   Physical Exam Constitutional:      Appearance: She is well-developed.  HENT:     Head: Normocephalic and atraumatic.  Eyes:     Pupils: Pupils are equal, round, and reactive to light.  Pulmonary:      Effort: Pulmonary effort is normal.     Breath sounds: Normal breath sounds.  Abdominal:     General: Bowel sounds are normal.     Palpations: Abdomen is soft.  Musculoskeletal:        General: Normal range of motion.     Cervical back: Normal range of motion and neck supple.  Skin:    General: Skin is warm and dry.  Neurological:     Mental Status: She is alert and oriented to person, place, and time.  Psychiatric:        Behavior: Behavior normal.        Thought Content: Thought content normal.        Judgment: Judgment normal.   Ortho Exam  Specialty Comments:  No specialty comments available.  Imaging: No results found.   PMFS History: Patient Active Problem List   Diagnosis Date Noted   Carpal tunnel syndrome, left upper limb 10/25/2019    Priority: High    Class: Chronic  Spinal stenosis, lumbar region, with neurogenic claudication 03/16/2019    Priority: High    Class: Chronic   Carpal tunnel syndrome, right upper limb 10/25/2019    Priority: Low    Class: Chronic   Atypical squamous cells cannot exclude high grade squamous intraepithelial lesion on cytologic smear of cervix (ASC-H) 09/01/2020   Vaginal itching 09/01/2020   De Quervain's disease (tenosynovitis) 08/22/2020   Insomnia 06/08/2020   Septic arthritis ([place]) 02/25/2020   Medication monitoring encounter 02/25/2020   S/P arthroscopy of right shoulder 01/05/2020   Tendinopathy of right biceps tendon 07/27/2019   Breast cancer screening by mammogram 06/07/2019   Seasonal allergic rhinitis due to pollen 06/07/2019   Colon cancer screening 06/07/2019   Acute stress reaction 09/25/2018   Vitamin D insufficiency 11/25/2013   Healthcare maintenance 11/25/2013   Depression 09/23/2011   Functional incontinence 10/18/2010   Iron deficiency anemia 05/12/2008   Hyperlipemia 06/18/2007   Essential hypertension 05/20/2007   Past Medical History:  Diagnosis Date   Anemia    Arthritis    knees hands    Arthritis, degenerative 11/17/2012   Arthrosis of right acromioclavicular joint 06/05/2012   Bipolar disorder ([place])    Cervicalgia    Depression    Environmental allergies    cause SOB, uses inhaler for   Environmental and seasonal allergies    uses inhaler prn   Full dentures    GERD (gastroesophageal reflux disease)    diet controlled - no meds   Hyperlipidemia    diet controlled, no meds   Hypertension    Left knee DJD, degenerative meniscus tear 04/06/2012   Steroid injections: 09/2013 12/2013    Lumbar radiculopathy, chronic    Nontraumatic incomplete tear of right rotator cuff 07/27/2019   Primary osteoarthritis of right hip 04/27/2018   Right knee meniscal tear 03/14/2011   Right knee pain    posterior horn medial meniscal tear MRI 2013   Spinal stenosis    getting epidural injections -last one 06/12/2015   Status post lumbar laminectomy 03/16/2019   Status post total hip replacement, 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   SVD (spontaneous vaginal delivery)    x 4    Family History  Problem Relation Age of Onset   Hypertension Mother     Past Surgical History:  Procedure Laterality Date   APPENDECTOMY     Bladder tack  10/2006   cystocoele   BREAST SURGERY Right    benign cyst   CARPAL TUNNEL RELEASE Left 10/25/2019   Procedure: LEFT CARPAL TUNNEL RELEASE;  Surgeon: Jessy Oto, MD;  Location: [facility];  Service: Orthopedics;  Laterality: Left;   COLONOSCOPY     Hysterectomy other     IRRIGATION AND DEBRIDEMENT SHOULDER Right 02/21/2020   Procedure: IRRIGATION AND DEBRIDEMENT RIGHT SHOULDER;  Surgeon: Leandrew Koyanagi, MD;  Location: [facility];  Service: Orthopedics;  Laterality: Right;   LUMBAR LAMINECTOMY N/A 03/16/2019   Procedure: CENTRAL LAMINECTOMIES L2-3, L3-4 AND L4-5;  Surgeon: Jessy Oto, MD;  Location: [facility];  Service: Orthopedics;  Laterality: N/A;   TOTAL HIP ARTHROPLASTY Right 04/27/2018   Procedure: RIGHT TOTAL HIP ARTHROPLASTY ANTERIOR APPROACH;   Surgeon: Leandrew Koyanagi, MD;  Location: [facility];  Service: Orthopedics;  Laterality: Right;   TUBAL LIGATION     Social History   Occupational History   Not on file  Tobacco Use   Smoking status: Former    Packs/day: 0.10    Types: Cigarettes  Quit date: 11/26/1983    Years since quitting: 36.9   Smokeless tobacco: Never  Vaping Use   Vaping Use: Never used  Substance and Sexual Activity   Alcohol use: Yes    Alcohol/week: 0.0 standard drinks    Comment: rarely   Drug use: Not Currently    Types: Marijuana    Comment: last used 2019   Sexual activity: Not Currently    Birth control/protection: Surgical

## 2020-10-30 ENCOUNTER — Ambulatory Visit: Payer: Medicare HMO

## 2020-10-31 ENCOUNTER — Encounter: Payer: Self-pay | Admitting: Orthopedic Surgery

## 2020-10-31 ENCOUNTER — Other Ambulatory Visit: Payer: Self-pay

## 2020-10-31 ENCOUNTER — Encounter: Payer: Medicare HMO | Admitting: Gastroenterology

## 2020-10-31 ENCOUNTER — Ambulatory Visit (INDEPENDENT_AMBULATORY_CARE_PROVIDER_SITE_OTHER): Payer: Medicare HMO | Admitting: *Deleted

## 2020-10-31 ENCOUNTER — Ambulatory Visit (INDEPENDENT_AMBULATORY_CARE_PROVIDER_SITE_OTHER): Payer: Medicare HMO | Admitting: Orthopedic Surgery

## 2020-10-31 DIAGNOSIS — M654 Radial styloid tenosynovitis [de Quervain]: Secondary | ICD-10-CM

## 2020-10-31 DIAGNOSIS — Z23 Encounter for immunization: Secondary | ICD-10-CM | POA: Diagnosis not present

## 2020-10-31 DIAGNOSIS — G5601 Carpal tunnel syndrome, right upper limb: Secondary | ICD-10-CM | POA: Diagnosis not present

## 2020-10-31 NOTE — Progress Notes (Signed)
Office Visit Note   Patient: Olivia Ewing           Date of Birth: 08-03-1950           MRN: [id number] Visit Date: 10/31/2020              Requested by: Masters, [place], DO [address] [place],  [place] [address] PCP: [name], DO   Assessment & Plan: Visit Diagnoses:  1. Carpal tunnel syndrome, right upper limb   2. De Quervain's disease (tenosynovitis)     Plan: We discussed the diagnosis, prognosis, non-operative and operative treatment options for both de Quervains tenosynovitis and carpal tunnel syndrome.  After our discussion, the patient would like to proceed with night bracing of the right side and left 1st dorsal compartment injection.  We reviewed the risks and benefits of conservative management.  The patient expressed understanding of the reasoning and strategy going forward.  All patient questions and concerns were addressed.    Follow-Up Instructions: No follow-ups on file.   Orders:  No orders of the defined types were placed in this encounter.  No orders of the defined types were placed in this encounter.     Procedures: No procedures performed   Clinical Data: No additional findings.   Subjective: Chief Complaint  Patient presents with   Right Wrist - Numbness, Pain, Weakness    Right hand dom, pain 8/10, hx of CTR on Right by Dr. Louanne Skye 10/25/19, tried injections helped some, wears splint at night   Left Wrist - Edema, Numbness, Pain, Weakness    +    DM: Neg Thyroid disease: Neg Wrist trauma: Neg Inflammatory arthritis: Neg C-spine: Neg  EMG on 08/06/19 w/ L severe and R moderate CTS  This is a 70 year old right-hand-dominant female who presents with left radial sided wrist pain and right carpal tunnel syndrome.  She describes 8/10 pain at the radial aspect of the wrist over the radial styloid that is worse with range of motion of the thumb.  This been going on for about 3 months.  She also has carpal tunnel syndrome on the  right with numbness and tingling in the index middle and ring fingers.  She does have nocturnal symptoms when she wakes up and has to shake her hands.  She had previous carpal tunnel syndrome on the left that is resolved with a carpal tunnel release in September of last year.  Weakness Associated symptoms include weakness.   Review of Systems  Constitutional: Negative.   Respiratory: Negative.    Cardiovascular: Negative.   Skin: Negative.   Neurological:  Positive for weakness.    Objective: Vital Signs: There were no vitals taken for this visit.  Physical Exam Constitutional:      Appearance: Normal appearance.  Cardiovascular:     Rate and Rhythm: Normal rate.     Pulses: Normal pulses.  Pulmonary:     Effort: Pulmonary effort is normal.  Skin:    General: Skin is warm and dry.     Capillary Refill: Capillary refill takes less than 2 seconds.  Neurological:     General: No focal deficit present.     Mental Status: She is alert.    Right Hand Exam   Tenderness  Right hand tenderness location: TTP w/ moderate swelling over first dorsal compartment.  Muscle Strength  Wrist extension: 5/5  Wrist flexion: 5/5  Grip: 5/5   Other  Erythema: absent Sensation: normal Pulse: present  Comments:  +  Tinel and CT compression tests, + Finkelstein test, moderate swelling over first dorsal compartment     Specialty Comments:  No specialty comments available.  Imaging: No results found.   PMFS History: Patient Active Problem List   Diagnosis Date Noted   Atypical squamous cells cannot exclude high grade squamous intraepithelial lesion on cytologic smear of cervix (ASC-H) 09/01/2020   Vaginal itching 09/01/2020   De Quervain's disease (tenosynovitis) 08/22/2020   Insomnia 06/08/2020   Septic arthritis ([place]) 02/25/2020   Medication monitoring encounter 02/25/2020   S/P arthroscopy of right shoulder 01/05/2020   Carpal tunnel syndrome, left upper limb 10/25/2019     Class: Chronic   Carpal tunnel syndrome, right upper limb 10/25/2019    Class: Chronic   Tendinopathy of right biceps tendon 07/27/2019   Breast cancer screening by mammogram 06/07/2019   Seasonal allergic rhinitis due to pollen 06/07/2019   Colon cancer screening 06/07/2019   Spinal stenosis, lumbar region, with neurogenic claudication 03/16/2019    Class: Chronic   Acute stress reaction 09/25/2018   Vitamin D insufficiency 11/25/2013   Healthcare maintenance 11/25/2013   Depression 09/23/2011   Functional incontinence 10/18/2010   Iron deficiency anemia 05/12/2008   Hyperlipemia 06/18/2007   Essential hypertension 05/20/2007   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   Bipolar disorder ([place])    Cervicalgia    Depression    Environmental allergies    cause SOB, uses inhaler for   Environmental and seasonal allergies    uses inhaler prn   Full dentures    GERD (gastroesophageal reflux disease)    diet controlled - no meds   Hyperlipidemia    diet controlled, no meds   Hypertension    Left knee DJD, degenerative meniscus tear 04/06/2012   Steroid injections: 09/2013 12/2013    Lumbar radiculopathy, chronic    Nontraumatic incomplete tear of right rotator cuff 07/27/2019   Primary osteoarthritis of right hip 04/27/2018   Right knee meniscal tear 03/14/2011   Right knee pain    posterior horn medial meniscal tear MRI 2013   Spinal stenosis    getting epidural injections -last one 06/12/2015   Status post lumbar laminectomy 03/16/2019   Status post total hip replacement, 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   SVD (spontaneous vaginal delivery)    x 4    Family History  Problem Relation Age of Onset   Hypertension Mother     Past Surgical History:  Procedure Laterality Date   APPENDECTOMY     Bladder tack  10/2006   cystocoele   BREAST SURGERY Right    benign cyst    CARPAL TUNNEL RELEASE Left 10/25/2019   Procedure: LEFT CARPAL TUNNEL RELEASE;  Surgeon: Jessy Oto, MD;  Location: [facility];  Service: Orthopedics;  Laterality: Left;   COLONOSCOPY     Hysterectomy other     IRRIGATION AND DEBRIDEMENT SHOULDER Right 02/21/2020   Procedure: IRRIGATION AND DEBRIDEMENT RIGHT SHOULDER;  Surgeon: Leandrew Koyanagi, MD;  Location: [facility];  Service: Orthopedics;  Laterality: Right;   LUMBAR LAMINECTOMY N/A 03/16/2019   Procedure: CENTRAL LAMINECTOMIES L2-3, L3-4 AND L4-5;  Surgeon: Jessy Oto, MD;  Location: [facility];  Service: Orthopedics;  Laterality: N/A;   TOTAL HIP ARTHROPLASTY Right 04/27/2018   Procedure: RIGHT TOTAL HIP ARTHROPLASTY ANTERIOR APPROACH;  Surgeon: Leandrew Koyanagi,  MD;  Location: [facility];  Service: Orthopedics;  Laterality: Right;   TUBAL LIGATION     Social History   Occupational History   Not on file  Tobacco Use   Smoking status: Former    Packs/day: 0.10    Types: Cigarettes    Quit date: 11/26/1983    Years since quitting: 36.9   Smokeless tobacco: Never  Vaping Use   Vaping Use: Never used  Substance and Sexual Activity   Alcohol use: Yes    Alcohol/week: 0.0 standard drinks    Comment: rarely   Drug use: Not Currently    Types: Marijuana    Comment: last used 2019   Sexual activity: Not Currently    Birth control/protection: Surgical

## 2020-11-07 ENCOUNTER — Telehealth: Payer: Self-pay | Admitting: Orthopedic Surgery

## 2020-11-07 NOTE — Telephone Encounter (Signed)
FYI- She wanted to let us know that her wrist is doing much better and she did find her brace and she is wearing her brace and her thumb is doing better, she is still getting the pains up her arm into the forearm, but she will put the brace on and reposition her arm and it will go away.

## 2020-11-07 NOTE — Telephone Encounter (Signed)
Patient called asked for a call back concerning the injection she got in her wrist last week. The number to contact patient is [phone]

## 2020-11-10 ENCOUNTER — Encounter: Payer: Self-pay | Admitting: Internal Medicine

## 2020-11-10 ENCOUNTER — Telehealth: Payer: Self-pay | Admitting: *Deleted

## 2020-11-10 NOTE — Telephone Encounter (Signed)
Patient no showed PV today at 4 pm-- Called patient  at 4 pm x 3 and got a recorded message of someone talking but was not the pt and unable to LM- called 405 pm, same messages, and again at 410 pm, same messages pt did not answer but recorded messages-   not able to leave a message for pt- these recorded message talk and talk but never gives the option to LM-   PV and Procedure both canceled-  No Show letter sent  to patient  via My Chart

## 2020-11-14 ENCOUNTER — Ambulatory Visit: Payer: Medicare HMO | Admitting: Behavioral Health

## 2020-11-14 ENCOUNTER — Telehealth: Payer: Self-pay | Admitting: Behavioral Health

## 2020-11-14 NOTE — Telephone Encounter (Signed)
Lft 2 msgs for Pt re: today's 11:00am [facility] telehealth visit. Requested Pt call [facility] & r/s @ her convenience.  Dr.Tehran Rabenold

## 2020-11-15 ENCOUNTER — Ambulatory Visit: Payer: Medicare HMO | Admitting: Behavioral Health

## 2020-11-15 ENCOUNTER — Telehealth: Payer: Self-pay | Admitting: *Deleted

## 2020-11-15 DIAGNOSIS — F419 Anxiety disorder, unspecified: Secondary | ICD-10-CM

## 2020-11-15 DIAGNOSIS — F331 Major depressive disorder, recurrent, moderate: Secondary | ICD-10-CM

## 2020-11-15 NOTE — BH Specialist Note (Signed)
Integrated Behavioral Health via Telemedicine Visit  11/15/2020 [name] [id number]  Number of [place] visits: 8 Session Start time: 4:00pm  Session End time: 4:45pm Total time: 56   Referring Provider: Dr. Mellody Memos, MD Patient/Family location: Pt @ home in private [facility] Provider location: [facility] Office All persons participating in visit: Pt & Clinician Types of Service: Individual psychotherapy  I connected with Olivia Ewing and/or Olivia Ewing  self  via  Telephone or Video Enabled Telemedicine Application  (Video is Caregility application) and verified that I am speaking with the correct person using two identifiers. Discussed confidentiality:  8th visit  I discussed the limitations of telemedicine and the availability of in person appointments.  Discussed there is a possibility of technology failure and discussed alternative modes of communication if that failure occurs.  I discussed that engaging in this telemedicine visit, they consent to the provision of behavioral healthcare and the services will be billed under their insurance.  Patient and/or legal guardian expressed understanding and consented to Telemedicine visit:  8th visit  Presenting Concerns: Patient and/or family reports the following symptoms/concerns: elevated anx/dep due to her disappointment over not being able to pick up her Son who has been incarcerated for 15 yrs Duration of problem: months-acutely this week; Severity of problem: moderate trending severe  Patient and/or Family's Strengths/Protective Factors: Social connections, Social and Emotional competence, Concrete supports in place (healthy food, safe environments, etc.), and Sense of purpose  Goals Addressed: Patient will:  Reduce symptoms of: anxiety, depression, and stress   Increase knowledge and/or ability of: coping skills, healthy habits, and stress reduction   Demonstrate ability to: Increase healthy  adjustment to current life circumstances  Progress towards Goals: Ongoing  Interventions: Interventions utilized:  Solution-Focused Strategies and Supportive Counseling Standardized Assessments completed:  screeners prn  Patient and/or Family Response: Pt receptive & grateful for call today. Pt trusts [facility] & Staff here to assist when she is exp'g her worst moments. Pt requests future appt  Assessment: Patient currently experiencing elevated anx/dep & overwhelm due to issues invlvg her children. Pt is esp'ly sensitive to the pain & suffering of her children. Today, Pt feels anguish & emot'l overwhelm.  Patient may benefit from cont'd psychotherapy sessions for support & encouragement for Family issues.  Plan: Follow up with behavioral health clinician on : 2-3 wks on telehealth for 30 min Behavioral recommendations: write in your notebook as needed Referral(s): [facility] (In Clinic)  I discussed the assessment and treatment plan with the patient and/or parent/guardian. They were provided an opportunity to ask questions and all were answered. They agreed with the plan and demonstrated an understanding of the instructions.   They were advised to call back or seek an in-person evaluation if the symptoms worsen or if the condition fails to improve as anticipated.  Donnetta Hutching, LMFT

## 2020-11-15 NOTE — Telephone Encounter (Signed)
Patient called back to FO but hung up before being transferred to triage. Call placed to patient. She was calm, no longer crying. States she is ok at this time, just has "a lot going on." States she was in a meeting on Tuesday so missed Dr. Ronelle Nigh call. Understands Dr. Theodis Shove will call her today at 4 pm. She states she will be fine to wait till that time. She is very appreciative of concern that has been shown to her today.

## 2020-11-15 NOTE — Telephone Encounter (Signed)
Call from pt crying / stating she's overwhelmed. States her youngest son suppose to get out of jail after serving 15 yrs but he was not released. Also states she used to live with her aunt who died 2020-01-12, now the house is in foreclosure and she has to move by the end of the year. States she cannot go to a shelter. And no family to stay with. She did state having an appt today at a job she just got hired. But as she was getting ready, sitting on the bed, she just started crying - uncontrollable.  She has talked to Dr Theodis Shove before but she's not here this morning. I offer her to talk to someone else (@[facility]  Benjamin Perez) - no response, but pt starts crying uncontrollable. I ask if she 's there alone; states her other aunt is there. Then pt starts singing, I can hear music in the background. Pt's not saying anything when I called her name (several times) then the phone went dead. While calling pt back, I ask Lauren another nurse for assistance. Pt answer the phone. Lauren asked if she has thoughts of harming herself or someone else - pt states "No". She told Ander Purpura is overwhelmed. When asked, stated she would like to talk to Dr Theodis Shove.Pt made aware again Dr Theodis Shove is not currently here but we will get in touch with her. Lauren gave pt the number to [place] to call if she needed someone to talk to now - pt accepted the number. Pt sounds a little calmer, Call ended.

## 2020-11-15 NOTE — Telephone Encounter (Signed)
Pt was on my schedule on Tue & did not ans the TC. I will get her @ 4:00pm.

## 2020-11-21 ENCOUNTER — Encounter: Payer: Medicare HMO | Admitting: Gastroenterology

## 2020-11-27 ENCOUNTER — Other Ambulatory Visit: Payer: Self-pay | Admitting: Student

## 2020-11-27 ENCOUNTER — Other Ambulatory Visit: Payer: Self-pay | Admitting: Internal Medicine

## 2020-11-27 DIAGNOSIS — F32A Depression, unspecified: Secondary | ICD-10-CM

## 2020-11-27 NOTE — Telephone Encounter (Signed)
Requesting to speak with a nurse about getting shingle shot. Please call pt back.

## 2020-11-28 NOTE — Telephone Encounter (Signed)
RTC, informed patient she can call her pharmacy and make an appt to receive shingrix and tdap vaccine, she agreed. SChaplin, RN,BSN

## 2020-12-04 ENCOUNTER — Ambulatory Visit: Payer: Medicare HMO | Admitting: Orthopedic Surgery

## 2020-12-04 ENCOUNTER — Encounter: Payer: Medicare HMO | Admitting: Student

## 2020-12-05 ENCOUNTER — Ambulatory Visit: Payer: Medicare HMO | Admitting: Behavioral Health

## 2020-12-05 ENCOUNTER — Ambulatory Visit: Payer: Medicare HMO | Admitting: Orthopedic Surgery

## 2020-12-05 DIAGNOSIS — F331 Major depressive disorder, recurrent, moderate: Secondary | ICD-10-CM

## 2020-12-05 DIAGNOSIS — F419 Anxiety disorder, unspecified: Secondary | ICD-10-CM
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
## 2020-12-07 ENCOUNTER — Encounter: Payer: Medicare HMO | Admitting: Student

## 2020-12-13 ENCOUNTER — Ambulatory Visit (INDEPENDENT_AMBULATORY_CARE_PROVIDER_SITE_OTHER): Payer: Medicare HMO | Admitting: Internal Medicine

## 2020-12-13 VITALS — BP 131/80 | HR 95 | Temp 98.6°F | Wt 194.4 lb

## 2020-12-13 DIAGNOSIS — R110 Nausea: Secondary | ICD-10-CM

## 2020-12-13 DIAGNOSIS — J4541 Moderate persistent asthma with (acute) exacerbation: Secondary | ICD-10-CM | POA: Diagnosis not present

## 2020-12-13 DIAGNOSIS — K219 Gastro-esophageal reflux disease without esophagitis: Secondary | ICD-10-CM | POA: Diagnosis not present

## 2020-12-13 DIAGNOSIS — J45909 Unspecified asthma, uncomplicated: Secondary | ICD-10-CM | POA: Diagnosis not present

## 2020-12-13 DIAGNOSIS — Z59819 Housing instability, housed unspecified: Secondary | ICD-10-CM

## 2020-12-13 MED ORDER — FAMOTIDINE 20 MG PO TABS: 20.0000 mg | ORAL_TABLET | Freq: Two times a day (BID) | ORAL | 2 refills | Status: DC

## 2020-12-13 MED ORDER — PROMETHAZINE HCL 25 MG PO TABS: 25.0000 mg | ORAL_TABLET | Freq: Three times a day (TID) | ORAL | 0 refills | Status: DC | PRN

## 2020-12-13 MED ORDER — ALBUTEROL SULFATE (2.5 MG/3ML) 0.083% IN NEBU
2.5000 mg | INHALATION_SOLUTION | Freq: Once | RESPIRATORY_TRACT | Status: AC
  Administered 2020-12-13: 2.5 mg via RESPIRATORY_TRACT

## 2020-12-13 MED ORDER — FAMOTIDINE 20 MG PO TABS: 20.0000 mg | ORAL_TABLET | Freq: Two times a day (BID) | ORAL | 1 refills | Status: DC

## 2020-12-13 MED ORDER — FLUTICASONE PROPIONATE HFA 44 MCG/ACT IN AERO: 1.0000 | INHALATION_SPRAY | Freq: Two times a day (BID) | RESPIRATORY_TRACT | 2 refills | Status: DC

## 2020-12-13 NOTE — Progress Notes (Signed)
Subjective:  CC: shortness of breath, acid reflux, and housing instability  HPI:  [name] is a 70 y.o. with medical history as below presenting to [facility] for shortness of breath,acid reflux, and housing instability.  She had been taken promethazine daily and was asked to come to [facility] to be evaluated for why she was needing this.  On presentation to clinic, patient was short of breath and had difficulty finishing sentences due to this, but was maintaining oxygen saturations without supplemental oxygen.  She stated that she has been exposed to several known trigger for asthma including smoke, mold, and weather change and she had felt short of breath since yesterday.  . Please see problem based assessment and plan for additional details.  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   Steroid injections: 09/2013 12/2013    Lumbar radiculopathy, chronic    Nontraumatic incomplete tear of right rotator cuff 07/27/2019   Primary osteoarthritis of right hip 04/27/2018   Right knee meniscal tear 03/14/2011   Right knee pain    posterior horn medial meniscal tear MRI 2013   Spinal stenosis    getting epidural injections -last one 06/12/2015   Status post lumbar laminectomy 03/16/2019   Status post total hip replacement, 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 60 tablet 2   ferrous gluconate (FERGON) 324 MG tablet TAKE 1 TABLET BY MOUTH DAILY WITH BREAKFAST 60 tablet 1   fluconazole (DIFLUCAN) 150 MG tablet Take 1 tablet (150 mg total) by mouth daily. 1 tablet 0   fluticasone (FLONASE) 50 MCG/ACT nasal spray Place 2 sprays into both nostrils daily. 18.2 mL 3   gabapentin (NEURONTIN) 300 MG capsule TAKE 1 CAPSULE BY MOUTH EVERYDAY AT BEDTIME 30 capsule 3   HYDROcodone-acetaminophen (NORCO/VICODIN) 5-325 MG tablet Take 1 tablet by mouth every 6 (six) hours as needed for moderate pain. 30 tablet 0   lisinopril-hydrochlorothiazide (ZESTORETIC) 20-12.5 MG tablet TAKE 1 TABLET BY MOUTH DAILY 30 tablet 5   loratadine (CLARITIN) 10 MG tablet Take 1 tablet (10 mg total) by mouth daily. 30 tablet 0   Multiple Vitamin (MULTIVITAMIN WITH MINERALS) TABS tablet Take 1 tablet by mouth daily.     QUEtiapine (SEROQUEL) 100 MG tablet Take 1 tablet (100 mg total) by mouth at bedtime. 90 tablet 3   rosuvastatin (CRESTOR) 20 MG tablet Take 1 tablet (20  mg total) by mouth daily. 90 tablet 3   sertraline (ZOLOFT) 100 MG tablet Take 1 tablet (100 mg total) by mouth daily. 30 tablet 6   sodium chloride (OCEAN) 0.65 % SOLN nasal spray Place 1 spray into both nostrils 2 (two) times daily as needed. 30 mL 0   No current facility-administered medications on file prior to visit.    Family History  Problem Relation Age of Onset   Hypertension Mother     Social History   Socioeconomic History   Marital status: Divorced    Spouse name: Not on file   Number of children: 4   Years of education: Not on file   Highest education level: Not on file  Occupational History   Not on  file  Tobacco Use   Smoking status: Former    Packs/day: 0.10    Types: Cigarettes    Quit date: 11/26/1983    Years since quitting: 37.0   Smokeless tobacco: Never  Vaping Use   Vaping Use: Never used  Substance and Sexual Activity   Alcohol use: Yes    Alcohol/week: 0.0 standard drinks    Comment: rarely   Drug use: Not Currently    Types: Marijuana    Comment: last used 2019   Sexual activity: Not Currently    Birth control/protection: Surgical  Other Topics Concern   Not on file  Social History Narrative   Single. 9 siblings. 1 brother with HIV, 1 sister with hypertension. 4 children, 1 daughter with HTN and depression, 1 son with HTN.   [phone]- shelter's number.    Former smoker, no alcohol or drug use.   Social Determinants of Health   Financial Resource Strain: Not on file  Food Insecurity: Not on file  Transportation Needs: Not on file  Physical Activity: Not on file  Stress: Not on file  Social Connections: Not on file  Intimate Partner Violence: Not on file    Review of Systems: ROS negative except for what is noted on the assessment and plan.  Objective:   Vitals:   12/13/20 1617 12/13/20 1642  BP: 131/80   Pulse: (!) 104 95  Temp: 98.6 F (37 C)   TempSrc: Oral   SpO2: 100% 100%  Weight: 194 lb 6.4 oz (88.2 kg)     Physical Exam: Gen: A&O x3 and in no apparent distress, well appearing and nourished. HEENT:    Head - normocephalic, atraumatic.    Eye - visual acuity grossly intact, conjunctiva clear, sclera non-icteric, EOM intact.    Mouth - No obvious caries or periodontal disease. Neck: no masses or nodules, AROM intact. CV: RRR, no murmurs, S1/S2 presents  Resp: Clear to ascultation bilaterally, no wheezing present Abd: BS (+) x4, soft, non-tender abdomen, without hepatosplenomegaly or masses MSK: Grossly normal AROM and strength x4 extremities. Skin: good skin turgor, no rashes, unusual bruising, or prominent lesions.  Neuro: No focal  deficits, grossly normal sensation and coordination.  Psych: Oriented x3 and responding appropriately. Intact memory, normal mood, judgement, affect, and insight.    Assessment & Plan:  See Encounters Tab for problem based charting.  Patient seen with Dr. Jay Schlichter Khamari Yousuf, D.O. [facility]  PGY-1 Pager: [phone]  Phone: [phone] Date 12/13/2020  Time 5:40 PM

## 2020-12-13 NOTE — Patient Instructions (Signed)
Olivia Ewing, it was a pleasure seeing you today!  Today we discussed: Asthma I sent in another inhaler called Flovent.  Please use this daily, even when you do not feel sick.  You can also use the albuterol inhaler on days that you feel more short of breath.  Acid reflux Please start taking Famotidine daily.  This will decrease acid in your stomach and hopefully make you feel less nauseated.  I have ordered the following labs today:  Lab Orders  No laboratory test(s) ordered today     Tests ordered today:  none  Referrals ordered today:   Referral Orders  No referral(s) requested today     I have ordered the following medication/changed the following medications:   Stop the following medications: Medications Discontinued During This Encounter  Medication Reason   promethazine (PHENERGAN) 25 MG tablet Reorder     Start the following medications: Meds ordered this encounter  Medications   albuterol (PROVENTIL) (2.5 MG/3ML) 0.083% nebulizer solution 2.5 mg   fluticasone (FLOVENT HFA) 44 MCG/ACT inhaler    Sig: Inhale 1 puff into the lungs 2 (two) times daily.    Dispense:  1 each    Refill:  2   famotidine (PEPCID) 20 MG tablet    Sig: Take 1 tablet (20 mg total) by mouth 2 (two) times daily.    Dispense:  60 tablet    Refill:  1   promethazine (PHENERGAN) 25 MG tablet    Sig: Take 1 tablet (25 mg total) by mouth every 8 (eight) hours as needed.    Dispense:  15 tablet    Refill:  0     Follow-up: 3 months   Please make sure to arrive 15 minutes prior to your next appointment. If you arrive late, you may be asked to reschedule.   We look forward to seeing you next time. Please call our clinic at [phone] if you have any questions or concerns. The best time to call is Monday-Friday from 9am-4pm, but there is someone available 24/7. If after hours or the weekend, call the main hospital number and ask for the Internal Medicine Resident On-Call. If you need  medication refills, please notify your pharmacy one week in advance and they will send [name] a request.  Thank you for letting us take part in your care. Wishing you the best!  Thank you, Dr. Hel[facility]

## 2020-12-13 NOTE — Assessment & Plan Note (Addendum)
Patient states that she has been nauseated due to acid reflux.  She has been on famotidine in the past and found this helpful.  Plan: -Famotidine 20 mg BID -promethazine 25 mg  ADDENDUM 11/30: Patient requested additional promethazine.  She states that she has been taking famotidine twice daily and has not noticed an improvement in symptoms.   -Refilled promethazine -Will send a message to front desk for patient to follow-up next week for further evaluation of nausea symptoms.
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
## 2020-12-13 NOTE — Assessment & Plan Note (Signed)
Patient states that she is overwhelmed with trying to find affordable housing currently.  She has been leaving with her aunt for a couple of years, but found out a few months ago that her aunt was behind in [place] and would be losing the house.  I will check in with [facility] social worker, Wyatt Portela, if any community resources are available.     -referral to [facility] social worker, [place]

## 2020-12-14 ENCOUNTER — Ambulatory Visit: Payer: Medicare HMO | Admitting: Behavioral Health

## 2020-12-14 ENCOUNTER — Telehealth: Payer: Self-pay | Admitting: *Deleted

## 2020-12-14 NOTE — Progress Notes (Signed)
Internal Medicine Clinic Attending  I saw and evaluated the patient.  I personally confirmed the key portions of the history and exam documented by Dr. Basaraba and I reviewed pertinent patient test results.  The assessment, diagnosis, and plan were formulated together and I agree with the documentation in the resident's note.    

## 2020-12-14 NOTE — Chronic Care Management (AMB) (Signed)
  Care Management   Outreach Note  12/14/2020 Name: Olivia Ewing MRN: [id number] DOB: 1950-04-20  Referred by: Masters, Joellen Jersey, DO Reason for referral : Care Coordination (Initial outreach to schedule referral with BSW)   An unsuccessful telephone outreach was attempted today. The patient was referred to the case management team for assistance with care management and care coordination.   Follow Up Plan:  A HIPAA compliant phone message was left for the patient providing contact information and requesting a return call. The care management team will reach out to the patient again over the next 7 days.  If patient returns call to provider office, please advise to call [place] at [phone].  [place] Management  Direct Dial: [phone]

## 2020-12-18 ENCOUNTER — Telehealth: Payer: Self-pay | Admitting: Behavioral Health

## 2020-12-18 ENCOUNTER — Ambulatory Visit: Payer: Medicare HMO | Admitting: Behavioral Health

## 2020-12-18 NOTE — Telephone Encounter (Signed)
Spoke for approximately 10 min w/Pt this afternoon for ck-in f/u from last week's encounter. Pt is having a hard cpl of days. She is staying w/her Elenor Legato Chrystal McCombs whose home is making her asthma act up. Pt discussed her Cousin's situation w/her Son who was in prison for 20 yrs & died from asthma attack prior to his release date. This triggered Pt's own personal situation w/her Son inprisoned for 15 yrs & recently denied his release due to some complex errors. Pt is unsure of Son's release date now.  Related to Pt we have a f/u routine appt on 12/26/20 @ 2:30pm. Pt acknowledged.  Dr. Theodis Shove

## 2020-12-20 NOTE — Chronic Care Management (AMB) (Signed)
  Care Management   Outreach Note  12/20/2020 Name: Olivia Ewing MRN: [id number] DOB: [birth year]  Referred by: Masters, Joellen Jersey, DO Reason for referral : Care Coordination (Initial outreach to schedule referral with BSW)   A second unsuccessful telephone outreach was attempted today. The patient was referred to the case management team for assistance with care management and care coordination.   Follow Up Plan:  A HIPAA compliant phone message was left for the patient providing contact information and requesting a return call.  The care management team will reach out to the patient again over the next 5 days.  If patient returns call to provider office, please advise to call [place]* at [phone].*  [place] Management  Direct Dial: [phone]

## 2020-12-25 ENCOUNTER — Telehealth: Payer: Self-pay | Admitting: Behavioral Health

## 2020-12-25 NOTE — Chronic Care Management (AMB) (Signed)
  Care Management   Note  12/25/2020 Name: Olivia Ewing MRN: [id number] DOB: 1951-02-11  Olivia Ewing is a 70 y.o. year old female who is a primary care patient of Masters, Katie, DO. I reached out to [name] by phone today in response to a referral sent by [name] primary care provider.   [name] was given information about care management services today including:  Care management services include personalized support from designated clinical staff supervised by her physician, including individualized plan of care and coordination with other care providers 24/7 contact phone numbers for assistance for urgent and routine care needs. The patient may stop care management services at any time by phone call to the office staff.  Patient agreed to services and verbal consent obtained.   Follow up plan: Telephone appointment with care management team member scheduled for:12/27/20  [place] Management  Direct Dial: [phone]

## 2020-12-25 NOTE — Telephone Encounter (Signed)
Lft brief msg on [phone] re: Tues appt being moved to 3:00pm.  Dr. Theodis Shove

## 2020-12-26 ENCOUNTER — Ambulatory Visit: Payer: Medicare HMO | Admitting: Behavioral Health

## 2020-12-26 DIAGNOSIS — F331 Major depressive disorder, recurrent, moderate: Secondary | ICD-10-CM

## 2020-12-26 DIAGNOSIS — Z59819 Housing instability, housed unspecified: Secondary | ICD-10-CM

## 2020-12-26 DIAGNOSIS — F419 Anxiety disorder, unspecified: Secondary | ICD-10-CM
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
## 2020-12-27 ENCOUNTER — Other Ambulatory Visit: Payer: Self-pay | Admitting: Internal Medicine

## 2020-12-27 ENCOUNTER — Ambulatory Visit: Payer: Medicare HMO | Admitting: Licensed Clinical Social Worker

## 2020-12-27 DIAGNOSIS — J301 Allergic rhinitis due to pollen: Secondary | ICD-10-CM

## 2020-12-27 NOTE — Chronic Care Management (AMB) (Signed)
  Care Management   Social Work Visit Note  12/27/2020 Name: [name] MRN: [id number] DOB: February 18, 1950  Olivia Ewing is a 70 y.o. year old female who sees Masters, Joellen Jersey, DO for primary care. The care management team was consulted for assistance with care management and care coordination needs related to [facility] Resources    Patient was given the following information about care management and care coordination services today, agreed to services, and gave verbal consent: 1.care management/care coordination services include personalized support from designated clinical staff supervised by their physician, including individualized plan of care and coordination with other care providers 2. 24/7 contact phone numbers for assistance for urgent and routine care needs. 3. The patient may stop care management/care coordination services at any time by phone call to the office staff.  Engaged with patient by telephone for initial visit in response to provider referral for social work chronic care management and care coordination services.  Assessment: Review of patient history, allergies, and health status during evaluation of patient need for care management/care coordination services.    Interventions:  Patient interviewed and appropriate assessments performed Collaborated with clinical team regarding patient needs  Patient advised she will need to be out of her current residence by 02/11/2021. Patient will apply to Housing and get placed on waiting list. Patient is able to stay with her Godmother while waiting for housing.  Patient denied needing assistance with housing or transportation.  Patient has an extended family and a major support system.   SDOH (Social Determinants of Health) assessments performed: Yes     Plan:  patient will work with BSW to address needs related to Housing barriers Social Worker will follow up on January 25, 2021 at 1:30 Pm.    Milus Height, [place]  Social  Worker [facility] Care Management  [phone]

## 2020-12-27 NOTE — Patient Instructions (Signed)
Visit Information  Instructions: patient will work with SW to address concerns related to Housing   Patient was given the following information about care management and care coordination services today, agreed to services, and gave verbal consent: 1.care management/care coordination services include personalized support from designated clinical staff supervised by their physician, including individualized plan of care and coordination with other care providers 2. 24/7 contact phone numbers for assistance for urgent and routine care needs. 3. The patient may stop care management/care coordination services at any time by phone call to the office staff.  Patient verbalizes understanding of instructions provided today and agrees to view in MyChart.   The care management team will reach out to the patient again over the next 30 days.   Janaki Exley, BSW  Social Worker [facility] Care Management  [phone]      

## 2021-01-02 ENCOUNTER — Telehealth: Payer: Self-pay | Admitting: Family Medicine

## 2021-01-02 NOTE — Telephone Encounter (Signed)
Called patient in regards to MyChart messages that have been left regarding appointment requests. There was no answer tot he phone call and there was no option to leave a voicemail. A call will be placed again at a later time unless patient calls back before.

## 2021-01-09 ENCOUNTER — Other Ambulatory Visit: Payer: Self-pay | Admitting: Student

## 2021-01-09 ENCOUNTER — Other Ambulatory Visit: Payer: Self-pay | Admitting: Internal Medicine

## 2021-01-09 ENCOUNTER — Ambulatory Visit: Payer: Medicare HMO | Admitting: Orthopedic Surgery

## 2021-01-09 DIAGNOSIS — J301 Allergic rhinitis due to pollen: Secondary | ICD-10-CM

## 2021-01-09 DIAGNOSIS — R110 Nausea: Secondary | ICD-10-CM

## 2021-01-10 MED ORDER — ALBUTEROL SULFATE HFA 108 (90 BASE) MCG/ACT IN AERS: INHALATION_SPRAY | RESPIRATORY_TRACT | 1 refills | Status: DC

## 2021-01-10 MED ORDER — PROMETHAZINE HCL 25 MG PO TABS: 25.0000 mg | ORAL_TABLET | Freq: Three times a day (TID) | ORAL | 0 refills | Status: DC | PRN

## 2021-01-10 NOTE — Addendum Note (Signed)
Addended by: Edwyna Perfect on: 01/10/2021 03:59 PM   Modules accepted: Orders

## 2021-01-10 NOTE — Telephone Encounter (Signed)
Called and spoke with patient. She has continued to have nausea symptoms with taking famotidine. Refilled promethazine and sent message to front desk to have patient follow-up for nausea.

## 2021-01-15 NOTE — Telephone Encounter (Signed)
[place] office - have patient follow-up for nausea per Dr Howie Ill Thanks

## 2021-01-22 ENCOUNTER — Ambulatory Visit: Payer: Medicare HMO | Admitting: Behavioral Health

## 2021-01-22 ENCOUNTER — Telehealth: Payer: Self-pay | Admitting: Behavioral Health

## 2021-01-22 ENCOUNTER — Encounter: Payer: Medicare HMO | Admitting: Internal Medicine

## 2021-01-22 ENCOUNTER — Telehealth: Payer: Self-pay | Admitting: *Deleted

## 2021-01-22 NOTE — Telephone Encounter (Signed)
Made 4 attempts to reach Pt for today's [facility] telehealth session. Twice to home phone w/no VM capacity & twice to mobile w/repeated busy signal. Will r/s Pt today for session in early Jan a/o Pt can call to schedule sooner if possible.  Dr. Theodis Shove

## 2021-01-22 NOTE — Telephone Encounter (Signed)
Called patient cell number busy / home phone no voice mail. Patient did not keep her appointment today.

## 2021-01-25 ENCOUNTER — Ambulatory Visit: Payer: Medicare HMO | Admitting: Orthopedic Surgery

## 2021-01-25 ENCOUNTER — Ambulatory Visit: Payer: Medicare HMO | Admitting: Licensed Clinical Social Worker

## 2021-01-25 NOTE — Chronic Care Management (AMB) (Signed)
°  Care Management   Social Work Visit Note  01/25/2021 Name: Olivia Ewing MRN: [id number] DOB: 11-12-1950  Olivia Ewing is a 70 y.o. year old female who sees Masters, Joellen [place], DO for primary care. The care management team was consulted for assistance with care management and care coordination needs related to [facility] Resources    Patient was given the following information about care management and care coordination services today, agreed to services, and gave verbal consent: 1.care management/care coordination services include personalized support from designated clinical staff supervised by their physician, including individualized plan of care and coordination with other care providers 2. 24/7 contact phone numbers for assistance for urgent and routine care needs. 3. The patient may stop care management/care coordination services at any time by phone call to the office staff.  Engaged with patient by telephone for follow up visit in response to provider referral for social work chronic care management and care coordination services.  Assessment: Review of patient history, allergies, and health status during evaluation of patient need for care management/care coordination services.    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  Patient is still employed. Patient will be moving in with her Godmother as of January 2023. Patient informed SW she missed her appointment with Dr. Theodis Shove and requested a call back.  Patient stated she has one more hurdle to overcome. Her son sentencing will take place in January 2023.       Plan:  patient will work with BSW to address needs related to Housing barriers Education officer, museum will follow up within 30 days.   Milus Height, [place]  Social Worker [facility] Care Management  [phone]

## 2021-01-25 NOTE — Patient Instructions (Signed)
Visit Information  Instructions: patient will work with SW to address concerns related to housing.  Patient was given the following information about care management and care coordination services today, agreed to services, and gave verbal consent: 1.care management/care coordination services include personalized support from designated clinical staff supervised by their physician, including individualized plan of care and coordination with other care providers 2. 24/7 contact phone numbers for assistance for urgent and routine care needs. 3. The patient may stop care management/care coordination services at any time by phone call to the office staff.  Patient verbalizes understanding of instructions provided today and agrees to view in MyChart.   The care management team will reach out to the patient again over the next 30 days.  Carlicia Leavens, BSW  Social Worker [facility]  [phone]      

## 2021-01-29 ENCOUNTER — Encounter: Payer: Medicare HMO | Admitting: Internal Medicine

## 2021-01-30 ENCOUNTER — Encounter: Payer: Self-pay | Admitting: Internal Medicine

## 2021-02-13 ENCOUNTER — Encounter: Payer: Medicare HMO | Admitting: Internal Medicine

## 2021-02-15 ENCOUNTER — Ambulatory Visit: Payer: Medicare HMO | Admitting: Behavioral Health

## 2021-02-19 ENCOUNTER — Encounter: Payer: Medicare HMO | Admitting: Family Medicine

## 2021-02-19 ENCOUNTER — Telehealth: Payer: Self-pay

## 2021-02-19 NOTE — Telephone Encounter (Signed)
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 Stated it probably was not the right time to call our office. She stated she will be in court with her son tomorrow who's currently in jail and will probably be sentenced to 48 yrs in prison. And stated this time of year is hard for her. I informed her I had called her daughter,Tamu -  stated she gives her permission for our office  to call her daughter at anytime. She agreed to schedule an appt w/Dr Theodis Shove; she preferred an in-person visit. Currently telehealth is available - she's agreeable - appt schedule on Thursday 1/12 @ 1030AM.

## 2021-02-19 NOTE — Telephone Encounter (Signed)
[name] office stated pt had called, crying - call was not completed. I called pt on her home and cell phones - no answer and unable to leave a message. I called her contact person, her daughter,Olivia Ewing - informed her of our concerns about her mother. Stated she had not talked to her mother today but she will check on her. I asked her to call our office back when/if she does - stated she will.

## 2021-02-19 NOTE — Telephone Encounter (Signed)
Pt missed a much-needed [facility] telehealth visit last week. I will try contacting her also-  Dr. Theodis Shove

## 2021-02-19 NOTE — Progress Notes (Deleted)
° ° °  GYNECOLOGY OFFICE COLPOSCOPY PROCEDURE NOTE  71 y.o. [id number] here for colposcopy for {Findings; lab pap smear results:16707::"no abnormalities"} pap smear on ***.   Pregnancy test:  {Blank single:19197::"positive","negative","not indicated"} Gardasil:  {Blank single:19197::"completed","not yet had, declines","not yet had, accepts"}. 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  Informed consent and review of risks, benefit and alternatives performed. Written consent given.   Speculum inserted into patient's vagina assuring full view of cervix and vaginal walls. 3 swabs of vinegar solution applied to the cervix and vaginal walls and colposcope was used to observe both the cervix and vaginal walls.   Colposcopy adequate? {yes/no:20286}  {Findings; colposcopy:728}; corresponding biopsies obtained.  ECC collected.  All specimens were labeled and sent to pathology.  Monsel's applied to biopsy sites for good hemostasis and speculum removed.  Pt tolerated well with minimal pain and bleeding.   Patient was given post procedure instructions.  Will follow up pathology and manage accordingly; patient will be contacted with results and recommendations.  Routine preventative health maintenance measures emphasized.    Renard Matter, MD, MPH OB Fellow, [place] for [place], [place]   Patient given informed consent, signed copy in the chart, time out was performed.  Placed in lithotomy position. Cervix viewed with speculum and colposcope after application of acetic acid.   Colposcopy adequate?  {Blank single:19197::"yes","no"} Acetowhite lesions?  {Blank single:19197::"yes","no"} Punctation?  {Blank single:19197::"yes","no"} Mosaicism?   {Blank single:19197::"yes","no"} Abnormal vasculature?  {Blank single:19197::"yes","no"} Biopsies?  {Blank single:19197::"yes","no"} ECC?  {Blank single:19197::"yes","no"}  Patient was given  post procedure instructions.  She will return in 2 weeks for results or obtain results via [place].    - Colposcopy was: {Blank single:19197::"Satisfactory","Not satisfactory"} with aforementioned findings. Biopsies and ECC as noted in procedure note.  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

## 2021-02-19 NOTE — Telephone Encounter (Signed)
Please call pt back.

## 2021-02-19 NOTE — Telephone Encounter (Signed)
RC to Pt & briefly encouraged her w/the situation her Son is facing that we discuss in psychotherapy. Ensured that Pt knows I am thinking about her as she approaches Court in the morning to support her Son. Connected w/Pt about the appt this Thur @ 10:30am. Pt is fine w/this designated time & appreciates the support today via RC.  Pt appreciates the support from [facility]-  Dr. Theodis Shove

## 2021-02-20 ENCOUNTER — Telehealth: Payer: Self-pay | Admitting: Behavioral Health

## 2021-02-20 NOTE — Telephone Encounter (Signed)
Pt due in [place] today for Son's Arraignment. Provided verbal, emotional support for Pt during this difficult time. Called Pt @ 9:50am, 12:01pm, & reached her @ 12:36pm to conduct call. Pt is w/her Lincoln Maxin from [place], [place] & her Dtr that lives in [place]. They are taking a CourtRm break until 2:00pm.   Pt is considerably upset @ this time, but appreciates call. Pt sts the Court is considering, "39 years" for Son's offense. Requested Pt suspend her focus on this as much as possible during the break so she can keep herself together. Pt agreed.   Suggested Pt get some food, drink some water, & walk since they have been sitting for hours. Pt agreed.  Concluded call & related to Pt Clinician support for Family.   Dr. Theodis Shove

## 2021-02-21 ENCOUNTER — Telehealth: Payer: Self-pay | Admitting: Behavioral Health

## 2021-02-21 ENCOUNTER — Other Ambulatory Visit: Payer: Self-pay | Admitting: Internal Medicine

## 2021-02-21 ENCOUNTER — Ambulatory Visit: Payer: Medicare HMO | Admitting: Behavioral Health

## 2021-02-21 ENCOUNTER — Other Ambulatory Visit: Payer: Self-pay | Admitting: Student

## 2021-02-21 DIAGNOSIS — F331 Major depressive disorder, recurrent, moderate: Secondary | ICD-10-CM

## 2021-02-21 DIAGNOSIS — E782 Mixed hyperlipidemia: Secondary | ICD-10-CM

## 2021-02-21 DIAGNOSIS — I10 Essential (primary) hypertension: Secondary | ICD-10-CM

## 2021-02-21 DIAGNOSIS — F419 Anxiety disorder, unspecified: Secondary | ICD-10-CM

## 2021-02-21 DIAGNOSIS — R110 Nausea: Secondary | ICD-10-CM

## 2021-02-21 NOTE — BH Specialist Note (Signed)
Integrated Behavioral Health via Telemedicine Visit  02/21/2021 Olivia Ewing [id number]  Number of [place] visits: 12 Session Start time: 1:40pm  Session End time: 2:00pm Total time: 20  Referring Provider: Dr. Mellody Memos, MD Patient/Family location: Pt is home in private [facility] Provider location: [facility] Office All persons participating in visit: Pt & Clinician Types of Service: Individual psychotherapy  I connected with [name] and/or Drucilla Schmidt Gubser's  self  via  Telephone or Video Enabled Telemedicine Application  (Video is Caregility application) and verified that I am speaking with the correct person using two identifiers. Discussed confidentiality: Yes   I discussed the limitations of telemedicine and the availability of in person appointments.  Discussed there is a possibility of technology failure and discussed alternative modes of communication if that failure occurs.  I discussed that engaging in this telemedicine visit, they consent to the provision of behavioral healthcare and the services will be billed under their insurance.  Patient and/or legal guardian expressed understanding and consented to Telemedicine visit: Yes   Presenting Concerns: Patient and/or family reports the following symptoms/concerns: reduced anx/dep since yesterday's ck-in call to support Pt for Court Date to adjudicate her Son for murder of GF Duration of problem: months; Severity of problem: moderate  Patient and/or Family's Strengths/Protective Factors: Social connections, Social and Emotional competence, Concrete supports in place (healthy food, safe environments, etc.), and Sense of purpose  Goals Addressed: Patient will:  Reduce symptoms of: anxiety, depression, stress, and psychological overwhelm due to inprisonment of Pt's Son for 7 year sentence    Increase knowledge and/or ability of: coping skills, healthy habits, and stress reduction   Demonstrate ability  to: Increase healthy adjustment to current life circumstances  Progress towards Goals: Ongoing  Interventions: Interventions utilized:  Supportive Counseling Standardized Assessments completed:  screeners prn  Patient and/or Family Response: Pt receptive to late call today to support Pt's mental health wellness during this difficult time.  Assessment: Patient currently experiencing elevated anx/dep & psychological numbness after Tues's events & arraignment.   Patient may benefit from cont'd ck-in & support for Pt's resilience through this timespan.  Plan: Follow up with behavioral health clinician on : 2-3 wks on telehealth for 30 min Behavioral recommendations: None today; be with your Family Referral(s): [place] (In Clinic)  I discussed the assessment and treatment plan with the patient and/or parent/guardian. They were provided an opportunity to ask questions and all were answered. They agreed with the plan and demonstrated an understanding of the instructions.   They were advised to call back or seek an in-person evaluation if the symptoms worsen or if the condition fails to improve as anticipated.  Donnetta Hutching, LMFT

## 2021-02-21 NOTE — Telephone Encounter (Signed)
Contacted Pt on both available telephone numbers in chart. Home does not have VM set-up so I am unable to lv Pt a msg. Mobile phone is not answered today. Pt is scheduled for an [facility] telehealth session this Demet[facility] [place], so hopefully Pt can answer @ that time.   Dr. Theodis Shove

## 2021-02-22 ENCOUNTER — Institutional Professional Consult (permissible substitution): Payer: Medicare HMO | Admitting: Behavioral Health

## 2021-02-23 ENCOUNTER — Other Ambulatory Visit: Payer: Self-pay | Admitting: Internal Medicine

## 2021-02-23 DIAGNOSIS — F32A Depression, unspecified: Secondary | ICD-10-CM

## 2021-02-23 DIAGNOSIS — E782 Mixed hyperlipidemia: Secondary | ICD-10-CM

## 2021-02-23 DIAGNOSIS — I10 Essential (primary) hypertension: Secondary | ICD-10-CM

## 2021-02-23 MED ORDER — SERTRALINE HCL 100 MG PO TABS: 150.0000 mg | ORAL_TABLET | Freq: Every day | ORAL | 11 refills | Status: DC

## 2021-02-23 MED ORDER — ROSUVASTATIN CALCIUM 20 MG PO TABS: 20.0000 mg | ORAL_TABLET | Freq: Every day | ORAL | 1 refills | Status: DC

## 2021-02-23 MED ORDER — AMLODIPINE BESYLATE 5 MG PO TABS: 5.0000 mg | ORAL_TABLET | Freq: Every day | ORAL | 1 refills | Status: DC

## 2021-02-23 MED ORDER — LISINOPRIL-HYDROCHLOROTHIAZIDE 20-12.5 MG PO TABS: 1.0000 | ORAL_TABLET | Freq: Every day | ORAL | 1 refills | Status: DC

## 2021-02-23 NOTE — Progress Notes (Signed)
Called and spoke with patient. She states that she has been crying daily and had difficulty sleeping after the recent changes to her son's sentencing. She also continues to have frequent bouts of nausea that feel almost like morning sickness when she was pregnant.  She endorses that she continues to have acid reflux and has been taking Pepcid as discussed, but not on a regular basis.    Depression: Symptoms of crying and difficulty sleeping concerning for worsening depression with recent life events.  Discussed with patient and she is interested in increasing sertraline dose. She has been taking seroquel to help with sleep and we also discussed trying melatonin. -Increase sertraline from 100 mg to 150 mg qd -Seroquel PRN -Melatonin PRN -She regularly meets with Dr. Theodis Shove and greatly appreciates her help  Nausea: Patient states that she continues to have bouts of nausea. She states that they are not associated with eating. She does sometimes loss appetite while eating.  She has been taking pepcid when she has gastric reflux and decreased consumption of spicy food. A/p- differentials include GERD leading to nausea vs nausea and loss of appetite from worsening depression.   -refilled promethazine -will message front desk for in-person follow-up of continued nausea symptoms -could consider trying PPI at follow-up visit.  HTN: Patient request Amlodipine 5 mg and Lisinopril-HCTZ 20-12.5 mg be sent to [facility] on [address]. -Amlodipine and Lisinopril-HCTZ refilled to [facility] rather than [facility]  HLD: -[facility] rosuvastatin [facility] to [facility]

## 2021-02-23 NOTE — Telephone Encounter (Signed)
Patient calling back for all her pending refills.

## 2021-02-23 NOTE — Telephone Encounter (Signed)
Will have patient follow up in clinic for further work-up of continued nausea. Called and spoke with patient. Will message front desk to assist with follow-up.

## 2021-02-26 ENCOUNTER — Encounter: Payer: Self-pay | Admitting: Orthopedic Surgery

## 2021-02-26 ENCOUNTER — Ambulatory Visit (INDEPENDENT_AMBULATORY_CARE_PROVIDER_SITE_OTHER): Payer: Medicare HMO | Admitting: Orthopedic Surgery

## 2021-02-26 ENCOUNTER — Other Ambulatory Visit: Payer: Self-pay

## 2021-02-26 VITALS — BP 138/88 | HR 80

## 2021-02-26 DIAGNOSIS — M65342 Trigger finger, left ring finger: Secondary | ICD-10-CM

## 2021-02-26 DIAGNOSIS — M654 Radial styloid tenosynovitis [de Quervain]: Secondary | ICD-10-CM | POA: Diagnosis not present

## 2021-02-26 MED ORDER — BETAMETHASONE SOD PHOS & ACET 6 (3-3) MG/ML IJ SUSP
6.0000 mg | INTRAMUSCULAR | Status: AC | PRN
  Administered 2021-02-26: 6 mg

## 2021-02-26 MED ORDER — BETAMETHASONE SOD PHOS & ACET 6 (3-3) MG/ML IJ SUSP
6.0000 mg | INTRAMUSCULAR | Status: AC | PRN
  Administered 2021-02-26: 6 mg via INTRA_ARTICULAR

## 2021-02-26 MED ORDER — LIDOCAINE HCL 1 % IJ SOLN
1.0000 mL | INTRAMUSCULAR | Status: AC | PRN
  Administered 2021-02-26: 1 mL

## 2021-02-26 NOTE — Progress Notes (Signed)
Office Visit Note   Patient: Olivia Ewing           Date of Birth: 08-21-1950           MRN: [id number] Visit Date: 02/26/2021              Requested by: Masters, [place], DO [address] [place],  [place] [address] PCP: [name], DO   Assessment & Plan: Visit Diagnoses:  1. De Quervain's disease (tenosynovitis)   2. Trigger finger, left ring finger     Plan: We again reviewed the nature of De Quervain's tenosynovitis and trigger finger and both surgical and nonsurgical treatment options.  She previously underwent CSI into the first dorsal compartment with several months of symptom relief.  She is not interested in surgical first dorsal compartment or A1 pulley release as she is in the process of moving and cannot take any time off for surgery or recovery.  She would like to try one more corticosteroid injection into the first dorsal compartment and ring finger A1 pulley.  Risks and benefits of this approach were both discussed.  I'll see her back in 6 weeks or so if she's still symptomatic.   Follow-Up Instructions: No follow-ups on file.   Orders:  No orders of the defined types were placed in this encounter.  No orders of the defined types were placed in this encounter.     Procedures: Hand/UE Inj: L extensor compartment 1 for de Quervain's tenosynovitis on 02/26/2021 2:48 PM Indications: pain and therapeutic Details: 25 G needle, radial approach Medications: 1 mL lidocaine 1 %; 6 mg betamethasone acetate-betamethasone sodium phosphate 6 (3-3) MG/ML Outcome: tolerated well, no immediate complications Procedure, treatment alternatives, risks and benefits explained, specific risks discussed. Patient was prepped and draped in the usual sterile fashion.    Hand/UE Inj: L ring A1 for trigger finger on 02/26/2021 2:49 PM Indications: pain and tendon swelling Details: 25 G needle, volar approach Medications: 1 mL lidocaine 1 %; 6 mg betamethasone  acetate-betamethasone sodium phosphate 6 (3-3) MG/ML Outcome: tolerated well, no immediate complications Procedure, treatment alternatives, risks and benefits explained, specific risks discussed. Patient was prepped and draped in the usual sterile fashion.      Clinical Data: No additional findings.   Subjective: Chief Complaint  Patient presents with   Left Hand - Follow-up    This is a 71 year old female who presents for follow-up of left de Quervain's tenosynovitis.  She was last seen on 10/31/2020 at which time she underwent corticosteroid injection in the left first dorsal compartment.  She has that she had symptom relief from that point until Christmas Day.  Her symptoms and symptoms returned on this left side.  She also knows locking of this left ring finger that requires her to use her other hand to unlock it from a flexed position.  These problems are quite bothersome for her.  She is interested in repeat corticosteroid injection today.   Review of Systems   Objective: Vital Signs: BP 138/88 (BP Location: Right Arm, Patient Position: Sitting, Cuff Size: Large)    Pulse 80    SpO2 94%   Physical Exam  Left Hand Exam   Tenderness  Left hand tenderness location: TTP at first dorsal compartment over radial styloid and ring finger A1 pulley.   Other  Erythema: absent Sensation: normal Pulse: present  Comments:  ++ Finkelstein test.  Palpable and visible triggering of ring finger.      Specialty Comments:  No specialty comments available.  Imaging: No results found.   PMFS History: Patient Active Problem List   Diagnosis Date Noted   Trigger finger, left ring finger 02/26/2021   Asthma 12/13/2020   GERD (gastroesophageal reflux disease) 12/13/2020   Housing instability 12/13/2020   Atypical squamous cells cannot exclude high grade squamous intraepithelial lesion on cytologic smear of cervix (ASC-H) 09/01/2020   Vaginal itching 09/01/2020   De Quervain's  disease (tenosynovitis) 08/22/2020   Insomnia 06/08/2020   Septic arthritis ([place]) 02/25/2020   Medication monitoring encounter 02/25/2020   S/P arthroscopy of right shoulder 01/05/2020   Carpal tunnel syndrome, left upper limb 10/25/2019    Class: Chronic   Carpal tunnel syndrome, right upper limb 10/25/2019    Class: Chronic   Tendinopathy of right biceps tendon 07/27/2019   Breast cancer screening by mammogram 06/07/2019   Seasonal allergic rhinitis due to pollen 06/07/2019   Colon cancer screening 06/07/2019   Spinal stenosis, lumbar region, with neurogenic claudication 03/16/2019    Class: Chronic   Acute stress reaction 09/25/2018   Vitamin D insufficiency 11/25/2013   Healthcare maintenance 11/25/2013   Depression 09/23/2011   Functional incontinence 10/18/2010   Iron deficiency anemia 05/12/2008   Hyperlipemia 06/18/2007   Essential hypertension 05/20/2007   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   Bipolar disorder ([place])    Cervicalgia    Depression    Environmental allergies    cause SOB, uses inhaler for   Environmental and seasonal allergies    uses inhaler prn   Full dentures    GERD (gastroesophageal reflux disease)    diet controlled - no meds   Hyperlipidemia    diet controlled, no meds   Hypertension    Left knee DJD, degenerative meniscus tear 04/06/2012   Steroid injections: 09/2013 12/2013    Lumbar radiculopathy, chronic    Nontraumatic incomplete tear of right rotator cuff 07/27/2019   Primary osteoarthritis of right hip 04/27/2018   Right knee meniscal tear 03/14/2011   Right knee pain    posterior horn medial meniscal tear MRI 2013   Spinal stenosis    getting epidural injections -last one 06/12/2015   Status post lumbar laminectomy 03/16/2019   Status post total hip replacement, 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   SVD (spontaneous vaginal  delivery)    x 4    Family History  Problem Relation Age of Onset   Hypertension Mother     Past Surgical History:  Procedure Laterality Date   APPENDECTOMY     Bladder tack  10/2006   cystocoele   BREAST SURGERY Right    benign cyst   CARPAL TUNNEL RELEASE Left 10/25/2019   Procedure: LEFT CARPAL TUNNEL RELEASE;  Surgeon: Jessy Oto, MD;  Location: [facility];  Service: Orthopedics;  Laterality: Left;   COLONOSCOPY     Hysterectomy other     IRRIGATION AND DEBRIDEMENT SHOULDER Right 02/21/2020   Procedure: IRRIGATION AND DEBRIDEMENT RIGHT SHOULDER;  Surgeon: Leandrew Koyanagi, MD;  Location: [facility];  Service: Orthopedics;  Laterality: Right;   LUMBAR LAMINECTOMY N/A 03/16/2019   Procedure: CENTRAL LAMINECTOMIES L2-3, L3-4 AND L4-5;  Surgeon: Jessy Oto, MD;  Location: [facility];  Service: Orthopedics;  Laterality: N/A;   TOTAL HIP ARTHROPLASTY Right 04/27/2018   Procedure: RIGHT TOTAL HIP ARTHROPLASTY ANTERIOR APPROACH;  Surgeon:  Leandrew Koyanagi, MD;  Location: [facility];  Service: Orthopedics;  Laterality: Right;   TUBAL LIGATION     Social History   Occupational History   Not on file  Tobacco Use   Smoking status: Former    Packs/day: 0.10    Types: Cigarettes    Quit date: 11/26/1983    Years since quitting: 37.2   Smokeless tobacco: Never  Vaping Use   Vaping Use: Never used  Substance and Sexual Activity   Alcohol use: Yes    Alcohol/week: 0.0 standard drinks    Comment: rarely   Drug use: Not Currently    Types: Marijuana    Comment: last used 2019   Sexual activity: Not Currently    Birth control/protection: Surgical

## 2021-02-28 ENCOUNTER — Telehealth: Payer: Medicare HMO

## 2021-03-06 ENCOUNTER — Ambulatory Visit: Payer: Medicare HMO | Admitting: Orthopaedic Surgery

## 2021-03-12 ENCOUNTER — Ambulatory Visit: Payer: Medicare HMO | Admitting: Behavioral Health

## 2021-03-12 DIAGNOSIS — F331 Major depressive disorder, recurrent, moderate: Secondary | ICD-10-CM

## 2021-03-12 DIAGNOSIS — F419 Anxiety disorder, unspecified: Secondary | ICD-10-CM

## 2021-03-12 DIAGNOSIS — R454 Irritability and anger: Secondary | ICD-10-CM

## 2021-03-12 NOTE — BH Specialist Note (Signed)
Integrated Behavioral Health via Telemedicine Visit  03/12/2021 DIAMON REDDINGER [id number]  Number of [place] visits: 28 Session Start time: 10:30am  Session End time: 11:10am Total time: 40   Referring Provider: Dr. Jake Shark, DO Patient/Family location: Pt is @ her job in private [facility] Provider location: [place] All persons participating in visit: Pt & Clinician Types of Service: Individual psychotherapy  I connected with [name] and/or Drucilla Schmidt Fittro's  self  via  Telephone or Video Enabled Telemedicine Application  (Video is Caregility application) and verified that I am speaking with the correct person using two identifiers. Discussed confidentiality:  13th visit  I discussed the limitations of telemedicine and the availability of in person appointments.  Discussed there is a possibility of technology failure and discussed alternative modes of communication if that failure occurs.  I discussed that engaging in this telemedicine visit, they consent to the provision of behavioral healthcare and the services will be billed under their insurance.  Patient and/or legal guardian expressed understanding and consented to Telemedicine visit:  13th visit  Presenting Concerns: Patient and/or family reports the following symptoms/concerns: upsurge in anger since the Trial of her Son who was accused of homicide of his GF who were both in a "toxic relationship". Pt is exp'g tremendous anger since he was adjudicated. Duration of problem: months; Severity of problem: moderate  Patient and/or Family's Strengths/Protective Factors: Social and Emotional competence, Concrete supports in place (healthy food, safe environments, etc.), Sense of purpose, and Physical Health (exercise, healthy diet, medication compliance, etc.)  Goals Addressed: Patient will:  Reduce symptoms of: anxiety, depression, stress, and anger issues    Increase knowledge and/or ability of:  coping skills, healthy habits, self-management skills, and stress reduction   Demonstrate ability to: Increase healthy adjustment to current life circumstances, Increase adequate support systems for patient/family, and Begin healthy grieving over loss  Progress towards Goals: Ongoing  Interventions: Interventions utilized:  Supportive Counseling and validation of Pt anger issues since Son's situation has gone through the [place] Standardized Assessments completed:  screeners prn  Patient and/or Family Response: Pt is receptive & requests future appt  Assessment: Patient currently experiencing inc'd anger due to Son being incarcerated for homicide.   Patient may benefit from inc'd support during the current challenges she is facing.  Plan: Follow up with behavioral health clinician on : 2-3 wks on telehealth for 60 min Behavioral recommendations: Keep in contact w/your Son in his incarceration Referral(s): Integrated [employer] (In Clinic), explore [email or web address] & [employer]  I discussed the assessment and treatment plan with the patient and/or parent/guardian. They were provided an opportunity to ask questions and all were answered. They agreed with the plan and demonstrated an understanding of the instructions.   They were advised to call back or seek an in-person evaluation if the symptoms worsen or if the condition fails to improve as anticipated.  Donnetta Hutching, LMFT

## 2021-03-13 ENCOUNTER — Ambulatory Visit: Payer: Medicare HMO | Admitting: Orthopaedic Surgery

## 2021-03-15 ENCOUNTER — Ambulatory Visit: Payer: Medicare HMO | Admitting: Orthopaedic Surgery

## 2021-03-23 ENCOUNTER — Other Ambulatory Visit: Payer: Self-pay | Admitting: Student

## 2021-03-23 DIAGNOSIS — F32A Depression, unspecified: Secondary | ICD-10-CM

## 2021-04-03 ENCOUNTER — Other Ambulatory Visit: Payer: Self-pay | Admitting: Internal Medicine

## 2021-04-03 DIAGNOSIS — D508 Other iron deficiency anemias: Secondary | ICD-10-CM

## 2021-04-03 DIAGNOSIS — R110 Nausea: Secondary | ICD-10-CM

## 2021-04-03 DIAGNOSIS — F32A Depression, unspecified: Secondary | ICD-10-CM

## 2021-04-04 ENCOUNTER — Ambulatory Visit: Payer: Medicare HMO | Admitting: Behavioral Health

## 2021-04-04 ENCOUNTER — Telehealth: Payer: Self-pay | Admitting: Behavioral Health

## 2021-04-04 MED ORDER — SERTRALINE HCL 100 MG PO TABS: 150.0000 mg | ORAL_TABLET | Freq: Every day | ORAL | 2 refills | Status: DC

## 2021-04-04 MED ORDER — FERROUS GLUCONATE 324 (38 FE) MG PO TABS: 324.0000 mg | ORAL_TABLET | Freq: Every day | ORAL | 1 refills | Status: AC

## 2021-04-04 NOTE — Telephone Encounter (Signed)
RC to Pt per Pt request to [facility] @ [employer]. Tried ea avail # & could not successfully reach Pt.  Dr. Theodis Shove

## 2021-04-09 ENCOUNTER — Telehealth: Payer: Self-pay | Admitting: Behavioral Health

## 2021-04-09 NOTE — Telephone Encounter (Signed)
Pt reports her Dtr has communicated she will move into her home this Sat. Dtr does not want her Mother in the [place] residence dwntwn. She wants her safe. Her Son also related the same sentiment to Pt. Pt saw a Play @ [employer] in which her Liliane Bade was a participant & she totally enjoyed herself seeing his talent. Pt feels things have turned a corner & she is grateful!  Dr. Theodis Shove

## 2021-04-10 ENCOUNTER — Encounter: Payer: Self-pay | Admitting: Orthopedic Surgery

## 2021-04-10 ENCOUNTER — Other Ambulatory Visit: Payer: Self-pay

## 2021-04-10 ENCOUNTER — Ambulatory Visit (INDEPENDENT_AMBULATORY_CARE_PROVIDER_SITE_OTHER): Payer: Medicare HMO | Admitting: Orthopedic Surgery

## 2021-04-10 DIAGNOSIS — M65341 Trigger finger, right ring finger: Secondary | ICD-10-CM | POA: Diagnosis not present

## 2021-04-10 MED ORDER — BETAMETHASONE SOD PHOS & ACET 6 (3-3) MG/ML IJ SUSP
6.0000 mg | INTRAMUSCULAR | Status: AC | PRN
  Administered 2021-04-10: 6 mg via INTRA_ARTICULAR

## 2021-04-10 MED ORDER — LIDOCAINE HCL 1 % IJ SOLN
1.0000 mL | INTRAMUSCULAR | Status: AC | PRN
  Administered 2021-04-10: 1 mL

## 2021-04-10 NOTE — Progress Notes (Signed)
Office Visit Note   Patient: Olivia Ewing           Date of Birth: 1950-12-06           MRN: [id number] Visit Date: 04/10/2021              Requested by: Masters, [place], DO [address] [place],  [place] [id number] PCP: [name], DO   Assessment & Plan: Visit Diagnoses:  1. Trigger finger, right ring finger     Plan: Patient is 6 weeks out from a left de Quervain's injection and the left ring finger trigger finger injection.  Both of these issues have completely resolved.  She now complains of triggering of the right ring finger for the last 3 weeks.  She is interested in pursuing another corticosteroid injection into the right ring finger A1 pulley given the success of her previous injections.  I will see her back in 6 to 8 weeks if she still having symptoms.  Follow-Up Instructions: No follow-ups on file.   Orders:  No orders of the defined types were placed in this encounter.  No orders of the defined types were placed in this encounter.     Procedures: Hand/UE Inj: R ring A1 for trigger finger on 04/10/2021 4:23 PM Indications: tendon swelling and therapeutic Details: 25 G needle, volar approach Medications: 1 mL lidocaine 1 %; 6 mg betamethasone acetate-betamethasone sodium phosphate 6 (3-3) MG/ML Outcome: tolerated well, no immediate complications Procedure, treatment alternatives, risks and benefits explained, specific risks discussed. Consent was given by the patient. Immediately prior to procedure a time out was called to verify the correct patient, procedure, equipment, support staff and site/side marked as required. Patient was prepped and draped in the usual sterile fashion.      Clinical Data: No additional findings.   Subjective: Chief Complaint  Patient presents with   Right Ring Finger - Follow-up    This is a 71 year old female who presents with triggering of the right ring finger over the last 3 weeks.  She was last seen on 02/26/21  which time she underwent injection of the left first dorsal compartment and the left ring finger A1 pulley.  Her previous de Quervain's and trigger finger have completely resolved.  She has since developed triggering of the right ring finger for the last 3 weeks.  This is quite painful and bothersome.  She is interested in pursuing another steroid injection.     Review of Systems   Objective: Vital Signs: BP 136/87 (BP Location: Right Arm, Patient Position: Sitting, Cuff Size: Large)    Pulse 82    SpO2 96%   Physical Exam  Right Hand Exam   Tenderness  Right hand tenderness location: TTP at ring finger A1 pulley.  Other  Erythema: absent Sensation: normal Pulse: present  Comments:  Palpable triggering of ring finger w/ TTP at A1 pulley.    Left Hand Exam   Tenderness  The patient is experiencing no tenderness.   Other  Erythema: absent Sensation: normal Pulse: present  Comments:  No TTP at first dorsal compartment and ring finger A1 pulley.  No palpable triggering.  Negative Finkelstein test.      Specialty Comments:  No specialty comments available.  Imaging: No results found.   PMFS History: Patient Active Problem List   Diagnosis Date Noted   Trigger finger, right ring finger 04/10/2021   Trigger finger, left ring finger 02/26/2021   Asthma 12/13/2020   GERD (gastroesophageal reflux  disease) 12/13/2020   Housing instability 12/13/2020   Atypical squamous cells cannot exclude high grade squamous intraepithelial lesion on cytologic smear of cervix (ASC-H) 09/01/2020   Vaginal itching 09/01/2020   De Quervain's disease (tenosynovitis) 08/22/2020   Insomnia 06/08/2020   Septic arthritis ([place]) 02/25/2020   Medication monitoring encounter 02/25/2020   S/P arthroscopy of right shoulder 01/05/2020   Carpal tunnel syndrome, left upper limb 10/25/2019    Class: Chronic   Carpal tunnel syndrome, right upper limb 10/25/2019    Class: Chronic   Tendinopathy  of right biceps tendon 07/27/2019   Breast cancer screening by mammogram 06/07/2019   Seasonal allergic rhinitis due to pollen 06/07/2019   Colon cancer screening 06/07/2019   Spinal stenosis, lumbar region, with neurogenic claudication 03/16/2019    Class: Chronic   Acute stress reaction 09/25/2018   Vitamin D insufficiency 11/25/2013   Healthcare maintenance 11/25/2013   Depression 09/23/2011   Functional incontinence 10/18/2010   Iron deficiency anemia 05/12/2008   Hyperlipemia 06/18/2007   Essential hypertension 05/20/2007   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   Bipolar disorder ([place])    Cervicalgia    Depression    Environmental allergies    cause SOB, uses inhaler for   Environmental and seasonal allergies    uses inhaler prn   Full dentures    GERD (gastroesophageal reflux disease)    diet controlled - no meds   Hyperlipidemia    diet controlled, no meds   Hypertension    Left knee DJD, degenerative meniscus tear 04/06/2012   Steroid injections: 09/2013 12/2013    Lumbar radiculopathy, chronic    Nontraumatic incomplete tear of right rotator cuff 07/27/2019   Primary osteoarthritis of right hip 04/27/2018   Right knee meniscal tear 03/14/2011   Right knee pain    posterior horn medial meniscal tear MRI 2013   Spinal stenosis    getting epidural injections -last one 06/12/2015   Status post lumbar laminectomy 03/16/2019   Status post total hip replacement, 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   SVD (spontaneous vaginal delivery)    x 4    Family History  Problem Relation Age of Onset   Hypertension Mother     Past Surgical History:  Procedure Laterality Date   APPENDECTOMY     Bladder tack  10/2006   cystocoele   BREAST SURGERY Right    benign cyst   CARPAL TUNNEL RELEASE Left 10/25/2019   Procedure: LEFT CARPAL TUNNEL RELEASE;  Surgeon: Jessy Oto,  MD;  Location: [facility];  Service: Orthopedics;  Laterality: Left;   COLONOSCOPY     Hysterectomy other     IRRIGATION AND DEBRIDEMENT SHOULDER Right 02/21/2020   Procedure: IRRIGATION AND DEBRIDEMENT RIGHT SHOULDER;  Surgeon: Leandrew Koyanagi, MD;  Location: [facility];  Service: Orthopedics;  Laterality: Right;   LUMBAR LAMINECTOMY N/A 03/16/2019   Procedure: CENTRAL LAMINECTOMIES L2-3, L3-4 AND L4-5;  Surgeon: Jessy Oto, MD;  Location: [facility];  Service: Orthopedics;  Laterality: N/A;   TOTAL HIP ARTHROPLASTY Right 04/27/2018   Procedure: RIGHT TOTAL HIP ARTHROPLASTY ANTERIOR APPROACH;  Surgeon: Leandrew Koyanagi, MD;  Location: [facility];  Service: Orthopedics;  Laterality: Right;   TUBAL LIGATION     Social History   Occupational History   Not on file  Tobacco Use   Smoking  status: Former    Packs/day: 0.10    Types: Cigarettes    Quit date: 11/26/1983    Years since quitting: 37.3   Smokeless tobacco: Never  Vaping Use   Vaping Use: Never used  Substance and Sexual Activity   Alcohol use: Yes    Alcohol/week: 0.0 standard drinks    Comment: rarely   Drug use: Not Currently    Types: Marijuana    Comment: last used 2019   Sexual activity: Not Currently    Birth control/protection: Surgical

## 2021-04-16 ENCOUNTER — Encounter: Payer: Medicare HMO | Admitting: Internal Medicine

## 2021-04-16 NOTE — Progress Notes (Unsigned)
HTN Medications: Zestoretic 20-12.5 mg, amlodipine 5 mg A/P: -Repeat BMP  GERD/ nausea Medications: famotidine 20 mg BID Nausea Medications: promethazine 25 mg  HLD Medications: Rosuvastatin 20 mg Last lipid panel 6/21 with LDL 119, ASCVD  A/P: -repeat lipid panel  Iron deficiency  Medications: Iron supplement A/P: Repeat CBC  Hyperglycemia A/P: -HgbA1c  PAP smear July- did she make it to OBGYN? ASCUS with high risk HPV  Last colonoscopy 2011 wnl, repeat colonoscopy or Fecal occult  MDD Medications: sertraline 100 mg  Asthma Medications: albuterol  Insomnia Medications: seroquel

## 2021-04-17 ENCOUNTER — Encounter: Payer: Self-pay | Admitting: Internal Medicine

## 2021-04-17 IMAGING — MR MR CERVICAL SPINE W/O CM
5 of 6 series · 32 of 48 positions shown · non-contrast
Comparison: 04/09/2007

CLINICAL DATA: Upper arm pain, stress fracture suspected

EXAM:
MRI CERVICAL SPINE WITHOUT CONTRAST
TECHNIQUE: Multiplanar, multisequence MR imaging of the cervical spine was
performed. No intravenous contrast was administered.

[Series 6: T1 · sagittal · 3.3mm · 0.66mm/px · 6 of 12 slices shown (1 of 2)]
[im 1/12]
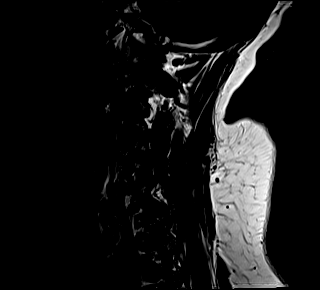
[im 3/12]
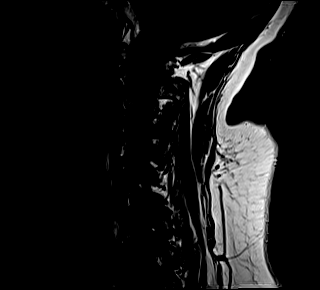
[im 5/12]
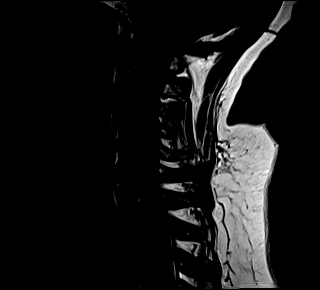
[im 7/12]
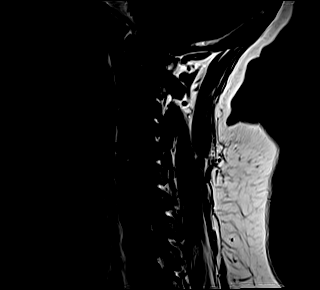
[im 9/12]
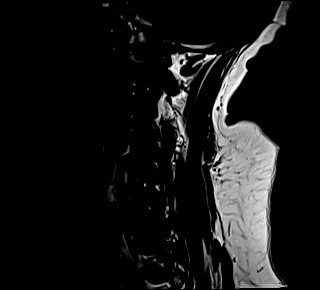
[im 12/12]
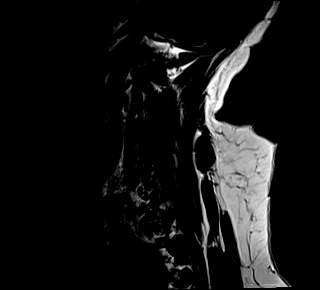

[Series 7: T2 · sagittal · 3.3mm · 0.55mm/px · 6 of 12 slices shown (1 of 2)]
[im 1/12]
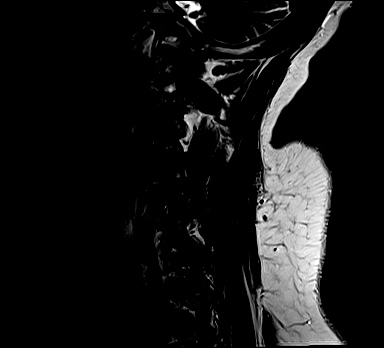
[im 3/12]
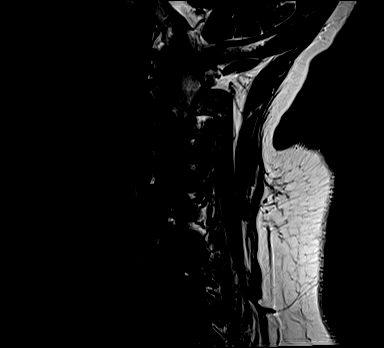
[im 5/12]
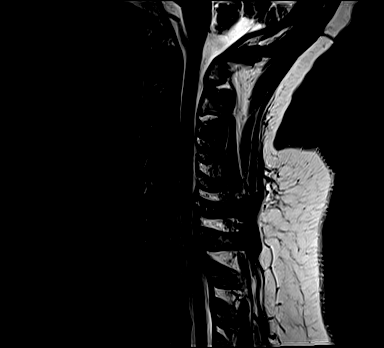
[im 7/12]
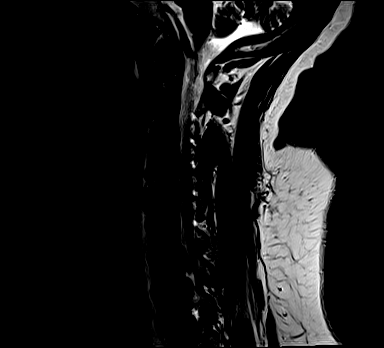
[im 9/12]
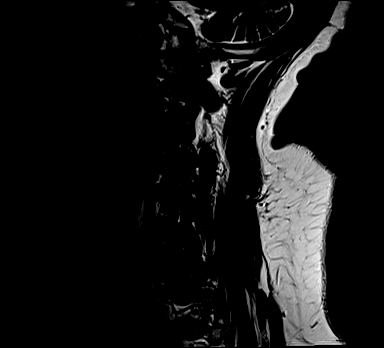
[im 12/12]
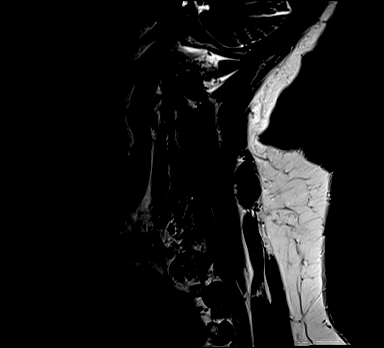

[Series 8: STIR · sagittal · 3.3mm · 0.33mm/px · 5 of 12 slices shown]
[im 1/12]
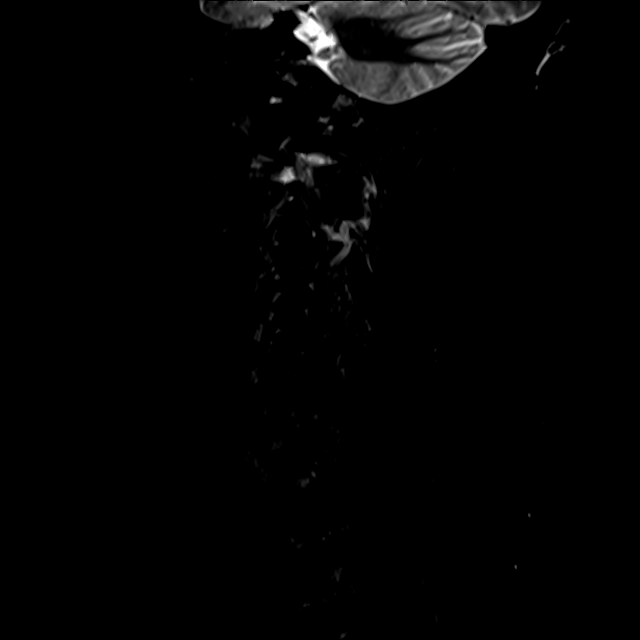
[im 3/12]
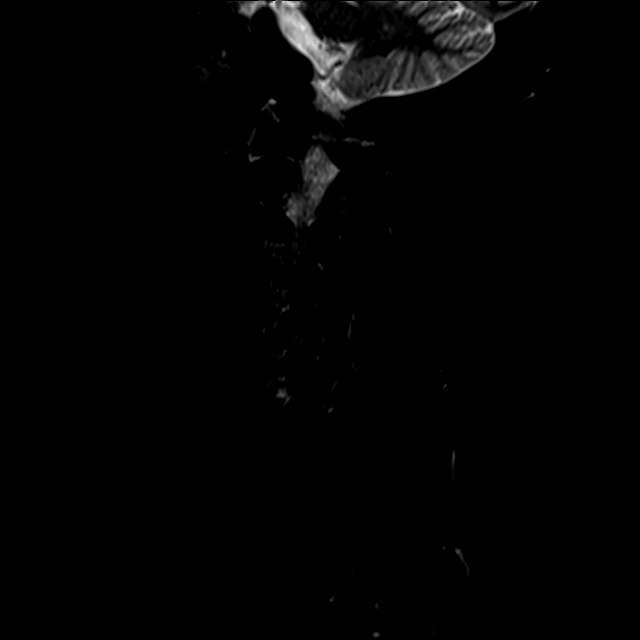
[im 5/12]
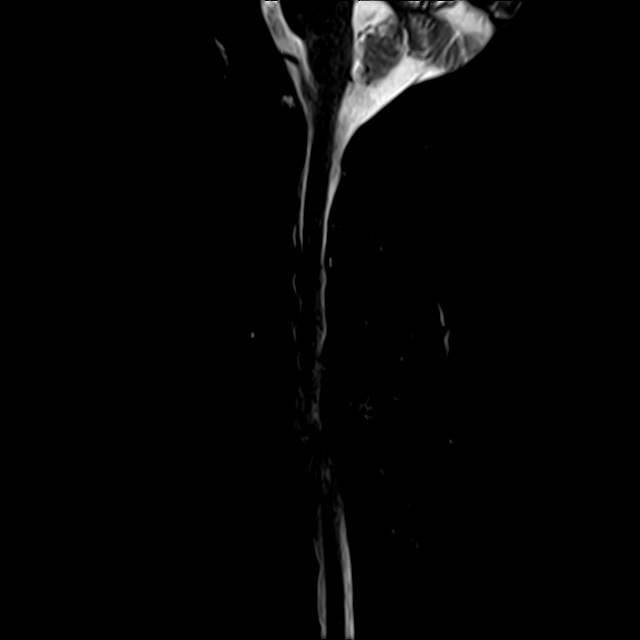
[im 7/12]
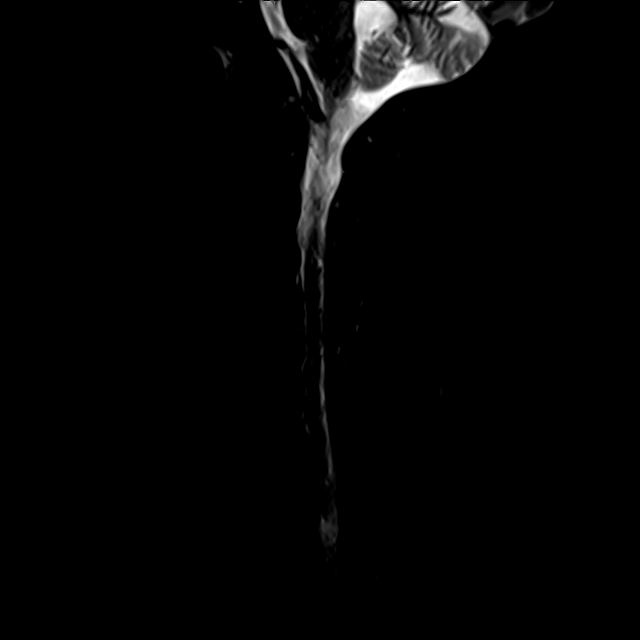
[im 9/12]
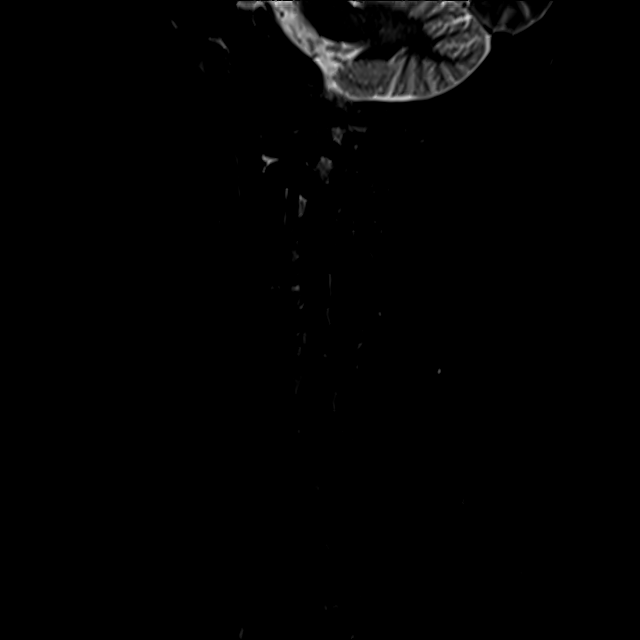

[Series 9: T2 · axial · 3.0mm · 0.70mm/px · z∈[-102,-9]mm · 9 of 26 slices shown (2 of 2)]
[im 1/26]
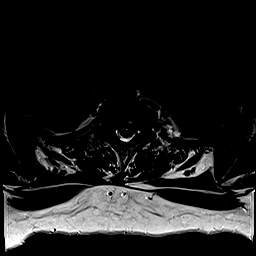
[im 5/26]
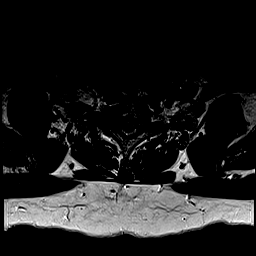
[im 7/26]
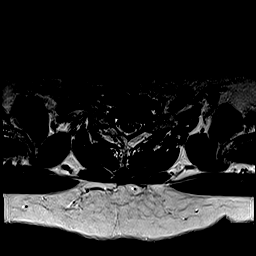
[im 12/26]
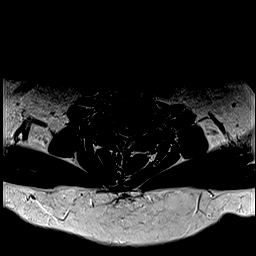
[im 14/26]
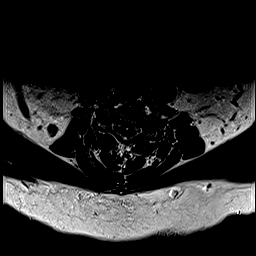
[im 19/26]
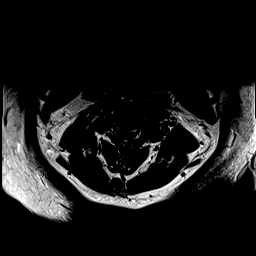
[im 21/26]
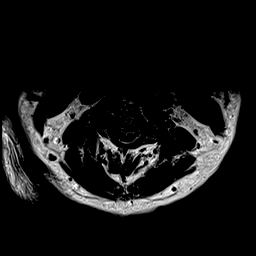
[im 23/26]
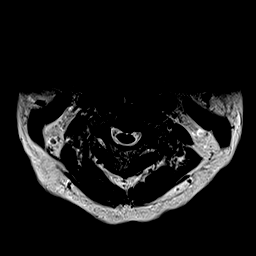
[im 26/26]
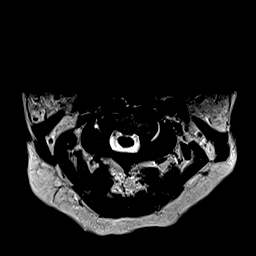

[Series 10: T1 · sagittal · 3.3mm · 0.66mm/px · 6 of 12 slices shown (2 of 2)]
[im 1/12]
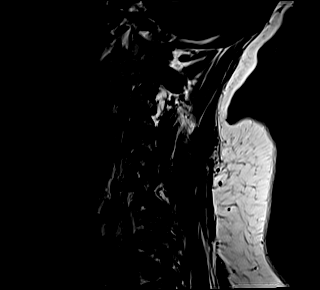
[im 3/12]
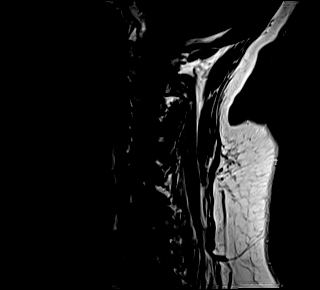
[im 5/12]
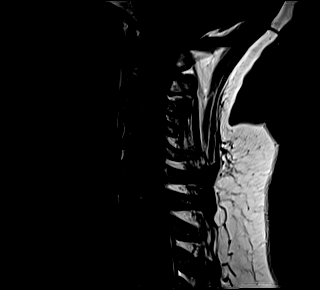
[im 7/12]
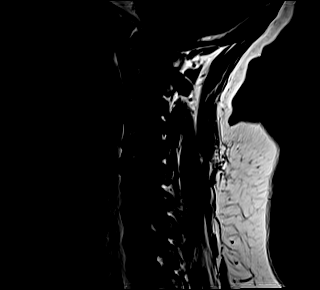
[im 9/12]
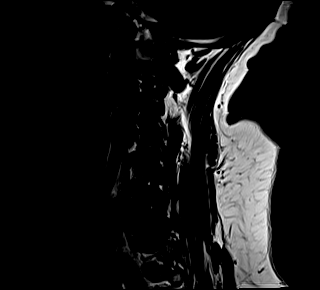
[im 12/12]
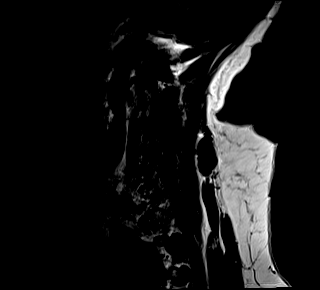

[32 of 48 positions shown; findings below may reference images not displayed]

FINDINGS: Alignment: 1-2 mm anterolisthesis of C7 on T1.

Vertebrae: No fracture, evidence of discitis, or bone lesion.

Cord: Normal signal and morphology.

Posterior Fossa, vertebral arteries, paraspinal tissues: Posterior
fossa demonstrates no focal abnormality. Vertebral artery flow voids
are maintained. Paraspinal soft tissues are unremarkable.

Disc levels:

Disc spaces: Degenerative disc disease with disc height loss at C5-6

C2-3: No significant disc bulge. No neural foraminal stenosis. No
central canal stenosis.

C3-4: Small central disc protrusion. Mild bilateral facet
arthropathy. Mild bilateral foraminal stenosis. No central canal
stenosis.

C4-5: Broad-based disc bulge. Mild bilateral foraminal stenosis.
Mild bilateral facet arthropathy. No central canal stenosis.

C5-6: Broad-based disc osteophyte complex. Bilateral uncovertebral
degenerative changes. Severe bilateral foraminal stenosis. Mild
spinal stenosis.

C6-7: No significant disc bulge. No neural foraminal stenosis. No
central canal stenosis.

C7-T1: Broad-based disc bulge. Severe right and moderate left facet
arthropathy. Severe right foraminal stenosis. Moderate-severe left
foraminal stenosis. Mild spinal stenosis.
IMPRESSION: 1. Diffuse cervical spine spondylosis as described above.
2.  No acute osseous injury of the cervical spine.

## 2021-04-24 ENCOUNTER — Encounter: Payer: Medicare HMO | Admitting: Internal Medicine

## 2021-04-30 ENCOUNTER — Encounter: Payer: Self-pay | Admitting: Internal Medicine

## 2021-04-30 ENCOUNTER — Encounter: Payer: Medicare HMO | Admitting: Internal Medicine

## 2021-04-30 NOTE — Progress Notes (Deleted)
Chronic N/V ?She requests monthly phenergan ? ?Depression ?Sertraline increased form 100 to 150 ?

## 2021-05-01 ENCOUNTER — Telehealth: Payer: Self-pay | Admitting: Behavioral Health

## 2021-05-01 ENCOUNTER — Institutional Professional Consult (permissible substitution): Payer: Medicare HMO | Admitting: Behavioral Health

## 2021-05-01 NOTE — Telephone Encounter (Signed)
4 unsuccessful attempts to contact Pt for IBH telehealth session today @ 2:00pm. Unable to lv msg for Pt to r/s. ? ?Dr. Theodis Shove ?

## 2021-05-02 ENCOUNTER — Ambulatory Visit: Payer: Medicare HMO | Admitting: Behavioral Health

## 2021-05-02 ENCOUNTER — Telehealth: Payer: Self-pay | Admitting: Behavioral Health

## 2021-05-02 NOTE — Telephone Encounter (Signed)
Unsuccessful & multiple attempts to reach Pt today. She missed her scheduled visit yesterday & evidently called to r/s to today. Phone is preferred contact per Pt stipulation last week to [facility] @ [employer]. Pt will need to [facility] to [facility] for further scheduling.  ? ?Dr. Theodis Shove ?

## 2021-05-02 NOTE — BH Specialist Note (Deleted)
Integrated Behavioral Health via Telemedicine Visit ? ?05/02/2021 ?TZIPPORAH NAGORSKI ?[id number] ? ?Number of [place] Clinician visits: No data recorded ?Session Start time: No data recorded  ?Session End time: No data recorded ?Total time in minutes: No data recorded ? ?Referring Provider: *** ?Patient/Family location: *** ?[facility] Provider location: *** ?All persons participating in visit: *** ?Types of Service: {CHL AMB TYPE OF SERVICE:2626153236} ? ?I connected with Olivia Ewing and/or Olivia Ewing {family members:20773} via  Telephone or Video Enabled Telemedicine Application  (Video is Caregility application) and verified that I am speaking with the correct person using two identifiers. Discussed confidentiality: {YES/NO:21197} ? ?I discussed the limitations of telemedicine and the availability of in person appointments.  Discussed there is a possibility of technology failure and discussed alternative modes of communication if that failure occurs. ? ?I discussed that engaging in this telemedicine visit, they consent to the provision of behavioral healthcare and the services will be billed under their insurance. ? ?Patient and/or legal guardian expressed understanding and consented to Telemedicine visit: {YES/NO:21197} ? ?Presenting Concerns: ?Patient and/or family reports the following symptoms/concerns: *** ?Duration of problem: ***; Severity of problem: {Mild/Moderate/Severe:20260} ? ?Patient and/or Family's Strengths/Protective Factors: ?{CHL AMB BH PROTECTIVE FACTORS:[phone]} ? ?Goals Addressed: ?Patient will: ? Reduce symptoms of: {IBH Symptoms:21014056}  ? Increase knowledge and/or ability of: {IBH Patient Tools:21014057}  ? Demonstrate ability to: {IBH Goals:21014053} ? ?Progress towards Goals: ?{CHL AMB BH PROGRESS TOWARDS ZJIRC:7893810175} ? ?Interventions: ?Interventions utilized:  {IBH Interventions:21014054} ?Standardized Assessments completed: {IBH Screening  Tools:21014051} ? ?Patient and/or Family Response: *** ? ?Assessment: ?Patient currently experiencing ***.  ? ?Patient may benefit from ***. ? ?Plan: ?Follow up with behavioral health clinician on : *** ?Behavioral recommendations: *** ?Referral(s): {IBH Referrals:21014055} ? ?I discussed the assessment and treatment plan with the patient and/or parent/guardian. They were provided an opportunity to ask questions and all were answered. They agreed with the plan and demonstrated an understanding of the instructions. ?  ?They were advised to call back or seek an in-person evaluation if the symptoms worsen or if the condition fails to improve as anticipated. ? ?Donnetta Hutching, LMFT ?

## 2021-05-08 ENCOUNTER — Ambulatory Visit (INDEPENDENT_AMBULATORY_CARE_PROVIDER_SITE_OTHER): Payer: Medicare HMO | Admitting: Internal Medicine

## 2021-05-08 ENCOUNTER — Other Ambulatory Visit: Payer: Self-pay

## 2021-05-08 ENCOUNTER — Encounter: Payer: Self-pay | Admitting: Internal Medicine

## 2021-05-08 ENCOUNTER — Other Ambulatory Visit (HOSPITAL_COMMUNITY)
Admission: RE | Admit: 2021-05-08 | Discharge: 2021-05-08 | Disposition: A | Discharge status: Home / Self Care | Payer: Medicare HMO | Source: Ambulatory Visit | Attending: Student in an Organized Health Care Education/Training Program | Admitting: Student in an Organized Health Care Education/Training Program

## 2021-05-08 VITALS — BP 134/78 | HR 63 | Temp 98.1°F | Ht 65.0 in | Wt 191.9 lb

## 2021-05-08 DIAGNOSIS — I10 Essential (primary) hypertension: Secondary | ICD-10-CM

## 2021-05-08 DIAGNOSIS — N898 Other specified noninflammatory disorders of vagina: Secondary | ICD-10-CM | POA: Diagnosis present

## 2021-05-08 DIAGNOSIS — R87610 Atypical squamous cells of undetermined significance on cytologic smear of cervix (ASC-US): Secondary | ICD-10-CM

## 2021-05-08 DIAGNOSIS — R87611 Atypical squamous cells cannot exclude high grade squamous intraepithelial lesion on cytologic smear of cervix (ASC-H): Secondary | ICD-10-CM

## 2021-05-08 DIAGNOSIS — R1319 Other dysphagia: Secondary | ICD-10-CM

## 2021-05-08 DIAGNOSIS — D649 Anemia, unspecified: Secondary | ICD-10-CM

## 2021-05-08 DIAGNOSIS — R110 Nausea: Secondary | ICD-10-CM | POA: Diagnosis not present

## 2021-05-08 DIAGNOSIS — Z1211 Encounter for screening for malignant neoplasm of colon: Secondary | ICD-10-CM

## 2021-05-08 DIAGNOSIS — R87810 Cervical high risk human papillomavirus (HPV) DNA test positive: Secondary | ICD-10-CM

## 2021-05-08 DIAGNOSIS — D509 Iron deficiency anemia, unspecified: Secondary | ICD-10-CM

## 2021-05-08 DIAGNOSIS — K219 Gastro-esophageal reflux disease without esophagitis: Secondary | ICD-10-CM

## 2021-05-08 DIAGNOSIS — J301 Allergic rhinitis due to pollen: Secondary | ICD-10-CM

## 2021-05-08 DIAGNOSIS — E785 Hyperlipidemia, unspecified: Secondary | ICD-10-CM

## 2021-05-08 DIAGNOSIS — F32A Depression, unspecified: Secondary | ICD-10-CM

## 2021-05-08 DIAGNOSIS — E782 Mixed hyperlipidemia: Secondary | ICD-10-CM

## 2021-05-08 MED ORDER — PROMETHAZINE HCL 25 MG PO TABS: 25.0000 mg | ORAL_TABLET | Freq: Three times a day (TID) | ORAL | 0 refills | Status: DC | PRN

## 2021-05-08 MED ORDER — AMLODIPINE BESYLATE 5 MG PO TABS: 5.0000 mg | ORAL_TABLET | Freq: Every day | ORAL | 2 refills | Status: DC

## 2021-05-08 MED ORDER — FLUTICASONE PROPIONATE 50 MCG/ACT NA SUSP: NASAL | 3 refills | Status: DC

## 2021-05-08 MED ORDER — PANTOPRAZOLE SODIUM 20 MG PO TBEC: 40.0000 mg | DELAYED_RELEASE_TABLET | Freq: Two times a day (BID) | ORAL | 1 refills | Status: DC

## 2021-05-08 MED ORDER — LISINOPRIL-HYDROCHLOROTHIAZIDE 20-12.5 MG PO TABS: 1.0000 | ORAL_TABLET | Freq: Every day | ORAL | 2 refills | Status: DC

## 2021-05-08 NOTE — Assessment & Plan Note (Addendum)
Lab Results  ?Component Value Date  ? CHOL 203 (H) 07/26/2019  ? HDL 33 (L) 07/26/2019  ? LDLCALC 119 (H) 07/26/2019  ? TRIG 287 (H) 07/26/2019  ? CHOLHDL 6.2 (H) 07/26/2019  ?Olivia Ewing endorses not taking rosuvastatin medication regularly.  She has not had side effects from this medication, but has had difficulty prioritizing her health with other things that have been going on. ?Medications: Rosuvastatin 20 mg ?P: ?We will recheck lipid panel today.  ? ?ADDENDUM: ?Statin therapy initially started due to LDL being greater than 190.  ?Refilled crestor and encouraged patient to take medication. ?

## 2021-05-08 NOTE — Assessment & Plan Note (Addendum)
Ms. Pagaduan states that she has genital itching for the past week.  She has noticed some white discharge.  She has not been sexually active since July.  She was diagnosed with bacterial vaginitis and Candida vaginitis in July and was treated. ?P: ?-Cervical ancillary swab for BV and Candida ? ?ADDENDUM 05/09/21: ?+ for BV and candida glabrata ?Spoke with patient about results and sent in metronidazole 500 mg BID for 7 days. Instructed patient to avoid alcohol consumption while taking medication. ?

## 2021-05-08 NOTE — Assessment & Plan Note (Signed)
Patient is overdue for follow-up for positive cervical cancer screening including discuss with high risk HPV.  Talk with patient about why I was making referral and importance of follow-up with gynecology. ?-Referral to gynecology ?

## 2021-05-08 NOTE — Assessment & Plan Note (Signed)
BP Readings from Last 3 Encounters:  ?05/08/21 (!) 143/91  ?04/10/21 136/87  ?02/26/21 138/88  ?Repeat BP at 134/ 78 ?She did not take amlodipine this morning. She states that she does not have a blood pressure at home. She will try getting a blood pressure cuff.  Her granddaughter recently graduated from [employer] school and she will have her check her blood pressure at home a few times over the next week and bring in her log. ?Medications: Amlodipine '5mg'$ , lisinopril-HCTZ 20-12.5 mg ?A/P: ?Refilled Lisinopril-HCTZ 20-12.5 mg ?Refilled Amlodipine 5 mg ?Recheck BMP ?

## 2021-05-08 NOTE — Assessment & Plan Note (Addendum)
Patient presents with acid reflux.  She states that she was treated for acid reflux with omeprazole in 2014 but discontinued this medication when she moved back to [place].  Her acid reflux had mostly been controlled until about a year ago.  At that time she began to notice increase burning sensation in chest after eating spicy food and started to decrease amount of spicy food.  She describes post prandial nausea with decreased appetite that she has been using promethazine to alleviate.  When I called patient in January of this year to discuss continued use of promethazine, she stated that she has had nausea with eating since she was a young child.  Patient was encouraged to follow-up in clinic at that time.  Over the last couple weeks she has also noticed esophageal dysphagia with solid foods, especially meat.  She states that famotidine and Phenergan relieved symptoms.  She has a remote history of smoking and quit in 1985. ? ?Her weight has been stable over the last couple of months.  She denies chills, night sweats.  Endorses change of appetite after eating.  ?On exam, abdomen is nontender, nondistended, and soft. ?A: ?Acid reflux symptoms appear to be worsening with new onset dysphagia.  Her presentation is concerning for esophageal web (history of iron deficiency anemia), erosive esophagitis, Barrett's esophagus, carcinoma, eosinophilic esophagitis, versus functional dysphagia. ?P: ?Referral to GI ?Protonix 40 mg twice daily for 8 weeks ?Famotidine discontinued ?Promethazine refilled ? ?

## 2021-05-08 NOTE — Assessment & Plan Note (Signed)
Ms. Oka presents with allergic rhinitis.  She states that she noticed symptoms most in the spring.  She uses Flonase daily and this alleviates symptoms. ?-refilled Flonase ?

## 2021-05-08 NOTE — Progress Notes (Addendum)
? ? ? ?Subjective:  ?CC: nausea and acid reflux ? ?HPI: ? ?[name] is a 71 year old with PMH of HTN, GERD, Iron deficiency anemia, and asthma who presents today for nausea and acid reflux. Please see problem based assessment and plan for additional details. ? ?Past Medical History:  ?Diagnosis Date  ? Anemia   ? Arthritis   ? knees hands  ? Arthritis, degenerative 11/17/2012  ? Arthrosis of right acromioclavicular joint 06/05/2012  ? Bipolar disorder ([place])   ? Cervicalgia   ? Depression   ? Environmental allergies   ? cause SOB, uses inhaler for  ? Environmental and seasonal allergies   ? uses inhaler prn  ? Full dentures   ? GERD (gastroesophageal reflux disease)   ? diet controlled - no meds  ? Hyperlipidemia   ? diet controlled, no meds  ? Hypertension   ? Left knee DJD, degenerative meniscus tear 04/06/2012  ? Steroid injections: 09/2013 12/2013   ? Lumbar radiculopathy, chronic   ? Nontraumatic incomplete tear of right rotator cuff 07/27/2019  ? Primary osteoarthritis of right hip 04/27/2018  ? Right knee meniscal tear 03/14/2011  ? Right knee pain   ? posterior horn medial meniscal tear MRI 2013  ? Spinal stenosis   ? getting epidural injections -last one 06/12/2015  ? Status post lumbar laminectomy 03/16/2019  ? Status post total hip replacement, 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  ? SVD (spontaneous vaginal delivery)   ? x 4  ? ? ?Current Outpatient Medications on File Prior to Visit  ?Medication Sig Dispense Refill  ? albuterol (VENTOLIN HFA) 108 (90 Base) MCG/ACT inhaler INHALE 1-2 PUFFS INTO LUNGS EVERY 6 HOURS AS NEEDED FOR WHEEZING OR SHORTNESS OF BREATH 8.5 g 1  ? diclofenac (VOLTAREN) 75 MG EC tablet Take 1 tablet (75 mg total) by mouth 2 (two) times daily. 60 tablet 2  ? famotidine (PEPCID) 20 MG tablet Take 1 tablet (20 mg total) by mouth 2 (two) times daily. 60 tablet 2  ? ferrous gluconate (FERGON) 324 MG tablet Take 1 tablet (324 mg total) by mouth daily with breakfast. 60 tablet 1  ? fluticasone (FLOVENT HFA) 44  MCG/ACT inhaler Inhale 1 puff into the lungs 2 (two) times daily. 1 each 2  ? gabapentin (NEURONTIN) 300 MG capsule TAKE 1 CAPSULE BY MOUTH EVERYDAY AT BEDTIME 30 capsule 3  ? loratadine (CLARITIN) 10 MG tablet Take 1 tablet (10 mg total) by mouth daily. 30 tablet 0  ? Multiple Vitamin (MULTIVITAMIN WITH MINERALS) TABS tablet Take 1 tablet by mouth daily.    ? QUEtiapine (SEROQUEL) 100 MG tablet Take 1 tablet (100 mg total) by mouth at bedtime. 90 tablet 3  ? sertraline (ZOLOFT) 100 MG tablet Take 1.5 tablets (150 mg total) by mouth daily. 90 tablet 2  ? sodium chloride (OCEAN) 0.65 % SOLN nasal spray Place 1 spray into both nostrils 2 (two) times daily as needed. 30 mL 0  ? ?No current facility-administered medications on file prior to visit.  ? ? ?Family History  ?Problem Relation Age of Onset  ? Hypertension Mother   ? ? ?Social History  ? ?Socioeconomic History  ? Marital status: Divorced  ?  Spouse name: Not on file  ? Number of children: 4  ? Years of education: Not on file  ? Highest education level: Not on file  ?Occupational History  ? Not on file  ?Tobacco Use  ? Smoking status: Former  ?  Packs/day: 0.10  ?  Types: Cigarettes  ?  Quit date: 11/26/1983  ?  Years since quitting: 37.4  ? Smokeless tobacco: Never  ?Vaping Use  ? Vaping Use: Never used  ?Substance and Sexual Activity  ? Alcohol use: Yes  ?  Alcohol/week: 0.0 standard drinks  ?  Comment: rarely  ? Drug use: Not Currently  ?  Types: Marijuana  ?  Comment: last used 2019  ? Sexual activity: Not Currently  ?  Birth control/protection: Surgical  ?Other Topics Concern  ? Not on file  ?Social History Narrative  ? Single. 9 siblings. 1 brother with HIV, 1 sister with hypertension. 4 children, 1 daughter with HTN and depression, 1 son with HTN.  ? [phone]- shelter's number.   ? Former smoker, no alcohol or drug use.  ? ?Social Determinants of Health  ? ?Financial Resource Strain: Not on file  ?Food Insecurity: No Food Insecurity  ? Worried About  Charity fundraiser in the Last Year: Never true  ? Ran Out of Food in the Last Year: Never true  ?Transportation Needs: No Transportation Needs  ? Lack of Transportation (Medical): No  ? Lack of Transportation (Non-Medical): No  ?Physical Activity: Not on file  ?Stress: Not on file  ?Social Connections: Not on file  ?Intimate Partner Violence: Not on file  ? ? ?Review of Systems: ?ROS negative except for what is noted on the assessment and plan. ? ?Objective:  ? ?Vitals:  ? 05/08/21 0950 05/08/21 1039  ?BP: (!) 143/91 134/78  ?Pulse: 70 63  ?Temp: 98.1 ?F (36.7 ?C)   ?TempSrc: Oral   ?SpO2: 100%   ?Weight: 191 lb 14.4 oz (87 kg)   ?Height: '5\' 5"'$  (1.651 m)   ? ? ?Physical Exam: ?Gen: A&O x3 and in no apparent distress, well appearing and nourished. ?Neck: no masses or nodules, AROM intact. ?CV: RRR, no murmurs, S1/S2 presents  ?Resp: Clear to ascultation bilaterally  ?Abd: BS (+) x4, soft, non-tender abdomen, without hepatosplenomegaly or masses ?MSK: Grossly normal AROM and strength x4 extremities. ?Skin: good skin turgor, no rashes, unusual bruising, or prominent lesions.  ?Neuro: No focal deficits, grossly normal sensation and coordination.  ?Psych: Oriented x3 and responding appropriately. Intact memory, normal mood, judgement, affect, and insight.  ? ? ?Assessment & Plan:  ?GERD (gastroesophageal reflux disease) ?Patient presents with acid reflux.  She states that she was treated for acid reflux with omeprazole in 2014 but discontinued this medication when she moved back to [place].  Her acid reflux had mostly been controlled until about a year ago.  At that time she began to notice increase burning sensation in chest after eating spicy food and started to decrease amount of spicy food.  She describes post prandial nausea with decreased appetite that she has been using promethazine to alleviate.  When I called patient in January of this year to discuss continued use of promethazine, she stated that she has  had nausea with eating since she was a young child.  Patient was encouraged to follow-up in clinic at that time.  Over the last couple weeks she has also noticed esophageal dysphagia with solid foods, especially meat.  She states that famotidine and Phenergan relieved symptoms.  She has a remote history of smoking and quit in 1985. ? ?Her weight has been stable over the last couple of months.  She denies chills, night sweats.  Endorses change of appetite after eating.  ?On exam, abdomen is nontender, nondistended, and soft. ?A: ?Acid reflux symptoms  appear to be worsening with new onset dysphagia.  Her presentation is concerning for esophageal web (history of iron deficiency anemia), erosive esophagitis, Barrett's esophagus, carcinoma, eosinophilic esophagitis, versus functional dysphagia. ?P: ?Referral to GI ?Protonix 40 mg twice daily for 8 weeks ?Famotidine discontinued ?Promethazine refilled ? ? ?Depression ?Sertraline dosage increased from 100-'150mg'$  in January.  She states that her mood is much improved from that time.  She has continued to follow with Dr. Carolynne Edouard and finds her conversations helpful.  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  ?P: ?Continue sertraline 150 mg daily ?Complete PHQ-9 at follow-up visit next week ? ?Essential hypertension ?BP Readings from Last 3 Encounters:  ?05/08/21 (!) 143/91  ?04/10/21 136/87  ?02/26/21 138/88  ?Repeat BP at 134/ 78 ?She did not take amlodipine this morning. She states that she does not have a blood pressure at home. She will try getting a blood pressure cuff.  Her granddaughter recently graduated from [employer] school and she will have her check her blood pressure at home a few times over the next week and bring in her log. ?Medications: Amlodipine '5mg'$ , lisinopril-HCTZ 20-12.5 mg ?A/P: ?Refilled Lisinopril-HCTZ 20-12.5 mg ?Refilled Amlodipine 5 mg ?Recheck BMP ? ?Atypical  squamous cells cannot exclude high grade squamous intraepithelial lesion on cytologic smear of cervix (ASC-H) ?Patient is overdue for follow-up for positive cervical cancer screening including discuss with high r

## 2021-05-08 NOTE — Patient Instructions (Addendum)
[name], it was a pleasure seeing you today! ? ?Today we discussed: ?Nausea/ Acid reflux ?I am referring you to GI for further evaluation of difficulty swallowing. Please take Protonix 40 mg, 1 time in morning and 1 time in evening.  ? ?Abnormal Cervical cancer screening ?Please follow-up with Gyn. Your prior cervical cancer screening was abnormal. I have sent in another referral for you. ? ?Colon cancer screening ?I am also referring you to GI for colonoscopy. ? ?Blood pressure ?Please continue taking amlodipine and Lisinopril-HCTZ every day. It would be helpful if you can get a blood pressure cuff to monitor blood pressure at home. A blood pressure log was included at the back of this packet. Please bring this in next week at follow-up visit. ? ?Cholesterol ?I am checking your cholesterol today. ? ?Low blood count ?I am checking iron studies today. You have had low iron in the past which can cause low blood count. ? ?Vaginal irritation ?I will call you with results of swab in a few days. ? ?Depression ?I am so glad you are feeling better! Please continue taking sertraline 150 mg. ? ?I have ordered the following labs today: ? ?Lab Orders    ?     BMP8+Anion Gap    ?     Iron, TIBC and Ferritin Panel    ?     Lipid Profile    ?     CBC no Diff    ?  ? ?I will call if any are abnormal. All of your labs can be accessed through "My Chart" ?  ?My Chart Access: ?[email or web address] ? ? ?Referrals ordered today:  ? ?Referral Orders    ?     Ambulatory referral to Gynecology    ?     Ambulatory referral to Gastroenterology    ?  ? ?I have ordered the following medication/changed the following medications:  ? ?Stop the following medications: ?Medications Discontinued During This Encounter  ?Medication Reason  ? HYDROcodone-acetaminophen (NORCO/VICODIN) 5-325 MG tablet Completed Course  ? fluconazole (DIFLUCAN) 150 MG tablet Completed Course  ? fluticasone (FLONASE) 50  MCG/ACT nasal spray Reorder  ? lisinopril-hydrochlorothiazide (ZESTORETIC) 20-12.5 MG tablet Reorder  ? amLODipine (NORVASC) 5 MG tablet Reorder  ? promethazine (PHENERGAN) 25 MG tablet Reorder  ?  ? ?Start the following medications: ?Meds ordered this encounter  ?Medications  ? lisinopril-hydrochlorothiazide (ZESTORETIC) 20-12.5 MG tablet  ?  Sig: Take 1 tablet by mouth daily.  ?  Dispense:  90 tablet  ?  Refill:  2  ?  Maximum Refills Reached  ? amLODipine (NORVASC) 5 MG tablet  ?  Sig: Take 1 tablet (5 mg total) by mouth daily.  ?  Dispense:  90 tablet  ?  Refill:  2  ?  Maximum Refills Reached  ? fluticasone (FLONASE) 50 MCG/ACT nasal spray  ?  Sig: PLACE 1-2 SPRAYS INTO BOTH NOSTRILS DAILY.  ?  Dispense:  16 mL  ?  Refill:  3  ? promethazine (PHENERGAN) 25 MG tablet  ?  Sig: Take 1 tablet (25 mg total) by mouth every 8 (eight) hours as needed.  ?  Dispense:  30 tablet  ?  Refill:  0  ?  Maximum Refills Reached  ? pantoprazole (PROTONIX) 20 MG tablet  ?  Sig: Take 2 tablets (40 mg total) by mouth 2 (two) times daily.  ?  Dispense:  120 tablet  ?  Refill:  1  ?  ? ?Follow-up:  1  week  to talk about urinary incontinence. ? ?Please make sure to arrive 15 minutes prior to your next appointment. If you arrive late, you may be asked to reschedule.  ? ?We look forward to seeing you next time. Please call our clinic at [phone] if you have any questions or concerns. The best time to call is Monday-Friday from 9am-4pm, but there is someone available 24/7. If after hours or the weekend, call the main hospital number and ask for the Internal Medicine Resident On-Call. If you need medication refills, please notify your pharmacy one week in advance and they will send [name] a request. ? ?Thank you for letting us take part in your care. Wishing you the best! ? ?Thank you, ?Dr. Howie Ill ?[place]  ?

## 2021-05-08 NOTE — Assessment & Plan Note (Signed)
Sertraline dosage increased from 100-'150mg'$  in January.  She states that her mood is much improved from that time.  She has continued to follow with Dr. Carolynne Edouard and finds her conversations helpful.  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  ?P: ?Continue sertraline 150 mg daily ?Complete PHQ-9 at follow-up visit next week ?

## 2021-05-08 NOTE — Assessment & Plan Note (Signed)
Patient presents with history of iron deficiency anemia.  She states that she takes ferrous gluconate 325 mg daily.  Last colonoscopy was in 2010 and was normal.  Last CBC was in 2021 with hemoglobin around 9 at that time.  Iron studies reflected deficiency.  ?A/P: ?Repeat CBC, iron studies ?Continue iron supplementation ?Referral to GI ?

## 2021-05-09 LAB — CBC
Hematocrit: 34.8 % (ref 34.0–46.6)
Hemoglobin: 10.9 g/dL — ABNORMAL LOW (ref 11.1–15.9)
MCH: 25.1 pg — ABNORMAL LOW (ref 26.6–33.0)
MCHC: 31.3 g/dL — ABNORMAL LOW (ref 31.5–35.7)
MCV: 80 fL (ref 79–97)
Platelets: 318 10*3/uL (ref 150–450)
RBC: 4.34 x10E6/uL (ref 3.77–5.28)
RDW: 14.4 % (ref 11.7–15.4)
WBC: 5.7 10*3/uL (ref 3.4–10.8)

## 2021-05-09 LAB — BMP8+ANION GAP
Anion Gap: 17 mmol/L (ref 10.0–18.0)
BUN/Creatinine Ratio: 16 (ref 12–28)
BUN: 12 mg/dL (ref 8–27)
CO2: 23 mmol/L (ref 20–29)
Calcium: 9.7 mg/dL (ref 8.7–10.3)
Chloride: 104 mmol/L (ref 96–106)
Creatinine, Ser: 0.76 mg/dL (ref 0.57–1.00)
Glucose: 96 mg/dL (ref 70–99)
Potassium: 4.7 mmol/L (ref 3.5–5.2)
Sodium: 144 mmol/L (ref 134–144)
eGFR: 85 mL/min/{1.73_m2} (ref >59)

## 2021-05-09 LAB — LIPID PANEL
Chol/HDL Ratio: 4.7 ratio — ABNORMAL HIGH (ref 0.0–4.4)
Cholesterol, Total: 198 mg/dL (ref 100–199)
HDL: 42 mg/dL (ref >39)
LDL Chol Calc (NIH): 126 mg/dL — ABNORMAL HIGH (ref 0–99)
Triglycerides: 166 mg/dL — ABNORMAL HIGH (ref 0–149)
VLDL Cholesterol Cal: 30 mg/dL (ref 5–40)

## 2021-05-09 LAB — IRON,TIBC AND FERRITIN PANEL
Ferritin: 43 ng/mL (ref 15–150)
Iron Saturation: 15 % (ref 15–55)
Iron: 58 ug/dL (ref 27–139)
Total Iron Binding Capacity: 393 ug/dL (ref 250–450)
UIBC: 335 ug/dL (ref 118–369)

## 2021-05-09 LAB — CERVICOVAGINAL ANCILLARY ONLY
Bacterial Vaginitis (gardnerella): POSITIVE — AB
Candida Glabrata: POSITIVE — AB
Candida Vaginitis: NEGATIVE
Comment: NEGATIVE
Comment: NEGATIVE
Comment: NEGATIVE

## 2021-05-09 MED ORDER — METRONIDAZOLE 500 MG PO TABS: 500.0000 mg | ORAL_TABLET | Freq: Three times a day (TID) | ORAL | 0 refills | Status: DC

## 2021-05-09 MED ORDER — ROSUVASTATIN CALCIUM 20 MG PO TABS: 20.0000 mg | ORAL_TABLET | Freq: Every day | ORAL | 1 refills | Status: DC

## 2021-05-09 NOTE — Progress Notes (Signed)
Internal Medicine Clinic Attending  Case discussed with Dr. Masters  At the time of the visit.  We reviewed the resident's history and exam and pertinent patient test results.  I agree with the assessment, diagnosis, and plan of care documented in the resident's note.  

## 2021-05-09 NOTE — Addendum Note (Signed)
Addended by: Edwyna Perfect on: 05/09/2021 04:24 PM ? ? Modules accepted: Orders ? ?

## 2021-05-11 ENCOUNTER — Ambulatory Visit (INDEPENDENT_AMBULATORY_CARE_PROVIDER_SITE_OTHER): Payer: Medicare HMO | Admitting: Orthopaedic Surgery

## 2021-05-11 ENCOUNTER — Ambulatory Visit (INDEPENDENT_AMBULATORY_CARE_PROVIDER_SITE_OTHER): Payer: Medicare HMO

## 2021-05-11 DIAGNOSIS — M79604 Pain in right leg: Secondary | ICD-10-CM

## 2021-05-11 MED ORDER — PREDNISONE 10 MG (21) PO TBPK: ORAL_TABLET | ORAL | 3 refills | Status: DC

## 2021-05-11 NOTE — Progress Notes (Signed)
? ?Office Visit Note ?  ?Patient: Olivia Ewing           ?Date of Birth: December 22, 1950           ?MRN: [id number] ?Visit Date: 05/11/2021 ?             ?Requested by: Masters, Scientist, research (physical sciences), DO ?[address],  [place] [id number] ?PCP: [name], DO ? ? ?Assessment & Plan: ?Visit Diagnoses:  ?1. Pain in right leg   ? ? ?Plan: Based on findings impression is lumbar radiculopathy.  We will send in prednisone Dosepak for her today.  She should follow-up with Dr. Louanne Skye if her symptoms persist. ? ?Follow-Up Instructions: No follow-ups on file.  ? ?Orders:  ?Orders Placed This Encounter  ?Procedures  ? XR HIP UNILAT W OR W/O PELVIS 1V RIGHT  ? XR Lumbar Spine 2-3 Views  ? ?Meds ordered this encounter  ?Medications  ? predniSONE (STERAPRED UNI-PAK 21 TAB) 10 MG (21) TBPK tablet  ?  Sig: Take as directed  ?  Dispense:  21 tablet  ?  Refill:  3  ? ? ? ? Procedures: ?No procedures performed ? ? ?Clinical Data: ?No additional findings. ? ? ?Subjective: ?Chief Complaint  ?Patient presents with  ? Right Hip - Pain  ? ? ?HPI ? ?Olivia Ewing is a 71 year old female who is alone time patient of mine comes in for evaluation of right hip and low back pain with burning pulling and tingling sensation.  She has had a previous back surgery by Dr. Louanne Skye.  She is also had a right hip replacement.  Her right shoulder is doing really well and has recovered from septic AC joint. ? ?Review of Systems  ?Constitutional: Negative.   ?HENT: Negative.    ?Eyes: Negative.   ?Respiratory: Negative.    ?Cardiovascular: Negative.   ?Endocrine: Negative.   ?Musculoskeletal: Negative.   ?Neurological: Negative.   ?Hematological: Negative.   ?Psychiatric/Behavioral: Negative.    ?All other systems reviewed and are negative. ? ? ?Objective: ?Vital Signs: There were no vitals taken for this visit. ? ?Physical Exam ?Vitals and nursing note reviewed.  ?Constitutional:   ?   Appearance: She is well-developed.  ?Pulmonary:  ?   Effort: Pulmonary effort is  normal.  ?Skin: ?   General: Skin is warm.  ?   Capillary Refill: Capillary refill takes less than 2 seconds.  ?Neurological:  ?   Mental Status: She is alert and oriented to person, place, and time.  ?Psychiatric:     ?   Behavior: Behavior normal.     ?   Thought Content: Thought content normal.     ?   Judgment: Judgment normal.  ? ? ?Ortho Exam ? ?Examination of the right hip shows fully healed surgical scar.  Excellent range of motion without pain.  She has pain in the low back with straight leg raise.  No motor or sensory deficits. ? ?Specialty Comments:  ?No specialty comments available. ? ?Imaging: ?XR HIP UNILAT W OR W/O PELVIS 1V RIGHT ? ?Result Date: 05/11/2021 ?Stable right total hip replacement without complication. ? ?XR Lumbar Spine 2-3 Views ? ?Result Date: 05/11/2021 ?Diffuse degenerative lumbar spine  ? ? ?PMFS History: ?Patient Active Problem List  ? Diagnosis Date Noted  ? Trigger finger, right ring finger 04/10/2021  ? Trigger finger, left ring finger 02/26/2021  ? Asthma 12/13/2020  ? GERD (gastroesophageal reflux disease) 12/13/2020  ? Housing instability 12/13/2020  ? Atypical squamous cells cannot  exclude high grade squamous intraepithelial lesion on cytologic smear of cervix (ASC-H) 09/01/2020  ? Vaginal itching 09/01/2020  ? De Quervain's disease (tenosynovitis) 08/22/2020  ? Insomnia 06/08/2020  ? Septic arthritis ([place]) 02/25/2020  ? Medication monitoring encounter 02/25/2020  ? S/P arthroscopy of right shoulder 01/05/2020  ? Carpal tunnel syndrome, left upper limb 10/25/2019  ?  Class: Chronic  ? Carpal tunnel syndrome, right upper limb 10/25/2019  ?  Class: Chronic  ? Tendinopathy of right biceps tendon 07/27/2019  ? Breast cancer screening by mammogram 06/07/2019  ? Seasonal allergic rhinitis due to pollen 06/07/2019  ? Colon cancer screening 06/07/2019  ? Spinal stenosis, lumbar region, with neurogenic claudication 03/16/2019  ?  Class: Chronic  ? Acute stress reaction 09/25/2018  ?  Vitamin D insufficiency 11/25/2013  ? Healthcare maintenance 11/25/2013  ? Depression 09/23/2011  ? Functional incontinence 10/18/2010  ? Pain in right leg 10/18/2010  ? Iron deficiency anemia 05/12/2008  ? Hyperlipemia 06/18/2007  ? Essential hypertension 05/20/2007  ? ?Past Medical History:  ?Diagnosis Date  ? Anemia   ? Arthritis   ? knees hands  ? Arthritis, degenerative 11/17/2012  ? Arthrosis of right acromioclavicular joint 06/05/2012  ? Bipolar disorder ([place])   ? Cervicalgia   ? Depression   ? Environmental allergies   ? cause SOB, uses inhaler for  ? Environmental and seasonal allergies   ? uses inhaler prn  ? Full dentures   ? GERD (gastroesophageal reflux disease)   ? diet controlled - no meds  ? Hyperlipidemia   ? diet controlled, no meds  ? Hypertension   ? Left knee DJD, degenerative meniscus tear 04/06/2012  ? Steroid injections: 09/2013 12/2013   ? Lumbar radiculopathy, chronic   ? Nontraumatic incomplete tear of right rotator cuff 07/27/2019  ? Primary osteoarthritis of right hip 04/27/2018  ? Right knee meniscal tear 03/14/2011  ? Right knee pain   ? posterior horn medial meniscal tear MRI 2013  ? Spinal stenosis   ? getting epidural injections -last one 06/12/2015  ? Status post lumbar laminectomy 03/16/2019  ? Status post total hip replacement, 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  ? SVD (spontaneous vaginal delivery)   ? x 4  ?  ?Family History  ?Problem Relation Age of Onset  ? Hypertension Mother   ?  ?Past Surgical History:  ?Procedure Laterality Date  ? APPENDECTOMY    ? Bladder tack  10/2006  ? cystocoele  ? BREAST SURGERY Right   ? benign cyst  ? CARPAL TUNNEL RELEASE Left 10/25/2019  ? Procedure: LEFT CARPAL TUNNEL RELEASE;  Surgeon: Jessy Oto, MD;  Location: [facility];  Service: Orthopedics;  Laterality: Left;  ? COLONOSCOPY    ? Hysterectomy other    ? IRRIGATION AND DEBRIDEMENT SHOULDER Right 02/21/2020  ? Procedure: IRRIGATION AND DEBRIDEMENT RIGHT SHOULDER;  Surgeon: Leandrew Koyanagi, MD;  Location:  [facility];  Service: Orthopedics;  Laterality: Right;  ? LUMBAR LAMINECTOMY N/A 03/16/2019  ? Procedure: CENTRAL LAMINECTOMIES L2-3, L3-4 AND L4-5;  Surgeon: Jessy Oto, MD;  Location: [facility];  Service: Orthopedics;  Laterality: N/A;  ? TOTAL HIP ARTHROPLASTY Right 04/27/2018  ? Procedure: RIGHT TOTAL HIP ARTHROPLASTY ANTERIOR APPROACH;  Surgeon: Leandrew Koyanagi, MD;  Location: [facility];  Service: Orthopedics;  Laterality: Right;  ? TUBAL LIGATION    ? ?Social History  ? ?Occupational History  ? Not on file  ?Tobacco Use  ? Smoking status: Former  ?  Packs/day:  0.10  ?  Types: Cigarettes  ?  Quit date: 11/26/1983  ?  Years since quitting: 37.4  ? Smokeless tobacco: Never  ?Vaping Use  ? Vaping Use: Never used  ?Substance and Sexual Activity  ? Alcohol use: Yes  ?  Alcohol/week: 0.0 standard drinks  ?  Comment: rarely  ? Drug use: Not Currently  ?  Types: Marijuana  ?  Comment: last used 2019  ? Sexual activity: Not Currently  ?  Birth control/protection: Surgical  ? ? ? ? ? ? ?

## 2021-05-18 ENCOUNTER — Inpatient Hospital Stay: Admission: RE | Admit: 2021-05-18 | Payer: Medicare HMO | Source: Ambulatory Visit

## 2021-06-04 ENCOUNTER — Telehealth: Payer: Self-pay | Admitting: Behavioral Health

## 2021-06-04 ENCOUNTER — Institutional Professional Consult (permissible substitution): Payer: Medicare HMO | Admitting: Behavioral Health

## 2021-06-04 NOTE — Telephone Encounter (Signed)
Pt is sleeping this morning & needs to r/s. ? ?Dr. Theodis Shove ?

## 2021-06-15 ENCOUNTER — Ambulatory Visit: Payer: Medicare HMO | Admitting: Orthopaedic Surgery

## 2021-06-20 ENCOUNTER — Other Ambulatory Visit: Payer: Self-pay | Admitting: Internal Medicine

## 2021-06-20 ENCOUNTER — Other Ambulatory Visit: Payer: Self-pay | Admitting: Specialist

## 2021-06-20 DIAGNOSIS — R110 Nausea: Secondary | ICD-10-CM

## 2021-06-28 ENCOUNTER — Telehealth: Payer: Self-pay | Admitting: Behavioral Health

## 2021-06-28 ENCOUNTER — Ambulatory Visit: Payer: Medicare HMO | Admitting: Surgery

## 2021-06-28 ENCOUNTER — Ambulatory Visit: Payer: Medicare HMO | Admitting: Behavioral Health

## 2021-06-28 NOTE — Telephone Encounter (Signed)
Unable to lv msg for Pt today. Phone sts, "Call cannot be completed". Pt will need to [facility] to [facility] & r/s @ her convenience.  Dr. Theodis Shove

## 2021-07-10 ENCOUNTER — Encounter: Payer: Self-pay | Admitting: Student in an Organized Health Care Education/Training Program

## 2021-07-10 ENCOUNTER — Telehealth: Payer: Self-pay | Admitting: Behavioral Health

## 2021-07-10 NOTE — Telephone Encounter (Signed)
RC to Pt @ 12:00pm today. Scheduled Pt for full visit on Mon, June 5th/2023 @ 3:00pm for F2F for 60 min.  Dr. Theodis Shove

## 2021-07-16 ENCOUNTER — Institutional Professional Consult (permissible substitution): Payer: Medicare HMO | Admitting: Behavioral Health

## 2021-07-24 ENCOUNTER — Ambulatory Visit (INDEPENDENT_AMBULATORY_CARE_PROVIDER_SITE_OTHER): Payer: Medicare HMO | Admitting: Sports Medicine

## 2021-07-24 DIAGNOSIS — M48061 Spinal stenosis, lumbar region without neurogenic claudication: Secondary | ICD-10-CM

## 2021-07-24 DIAGNOSIS — M25562 Pain in left knee: Secondary | ICD-10-CM | POA: Diagnosis not present

## 2021-07-24 MED ORDER — KETOROLAC TROMETHAMINE 60 MG/2ML IM SOLN
60.0000 mg | Freq: Once | INTRAMUSCULAR | Status: AC
  Administered 2021-07-24: 60 mg via INTRAMUSCULAR

## 2021-07-24 MED ORDER — METHYLPREDNISOLONE ACETATE 40 MG/ML IJ SUSP
40.0000 mg | Freq: Once | INTRAMUSCULAR | Status: AC
  Administered 2021-07-24: 40 mg via INTRA_ARTICULAR

## 2021-07-24 MED ORDER — HYDROCODONE-ACETAMINOPHEN 5-325 MG PO TABS: 1.0000 | ORAL_TABLET | Freq: Four times a day (QID) | ORAL | 0 refills | Status: DC | PRN

## 2021-07-24 NOTE — Progress Notes (Signed)
   Subjective:    [name], female    DOB: Jun 21, 1950, 71 y.o.   MRN: [id number]  HPI chief complaint: Left knee pain and swelling  Olivia Ewing presents today with left knee pain and swelling.  Pains been present for couple weeks but the knee gave way 2 days ago and began to swell shortly after that.  Pain is diffuse throughout the knee.  She denies any recent trauma.  Surgical history is significant for remote knee arthroscopy many years ago.  She is also beginning to experience some returning sciatica in the right hip.  She has had this before.  Intra medical history reviewed Medications reviewed Allergies reviewed  Review of Systems As above    Objective:   Physical Exam  Well-developed, well-nourished.  No acute distress  Left knee: Range of motion is limited secondary to pain and swelling.  1-2+ knee effusion.  Slightly warm to touch.  Nonerythematous.  Tender along both medial and lateral joint lines.  Neurovascular intact distally.  Walking with a noticeable limp and the assistance of a cane.      Assessment & Plan:   Left knee pain and swelling likely secondary to DJD Right-sided sciatica  Left knee was aspirated and injected today.  3 cc of lidocaine were used to anesthetize a tract along the superior lateral aspect of the left knee.  This was followed by aspiration of 12 cc of slight blood-tinged aspirate and an injection of 3 cc of Xylocaine and 1 cc of Depo-Medrol.  Patient tolerated this without difficulty.  Compression sleeve was applied and she will remain out of work until Saturday.  I called in a limited number of hydrocodone for her to take and she will follow-up with me again in 1 week.  For her right-sided sciatica she has done well with IM Toradol injections in the past.  She was injected today with 60 mg of Toradol IM without immediate complication.  This note was dictated using Dragon naturally speaking software and may contain errors in syntax,  spelling, or content which have not been identified prior to signing this note.

## 2021-07-25 ENCOUNTER — Other Ambulatory Visit: Payer: Self-pay | Admitting: Internal Medicine

## 2021-07-25 DIAGNOSIS — R110 Nausea: Secondary | ICD-10-CM

## 2021-07-31 ENCOUNTER — Ambulatory Visit: Payer: Medicare HMO | Admitting: Sports Medicine

## 2021-08-09 ENCOUNTER — Institutional Professional Consult (permissible substitution): Payer: Medicare HMO | Admitting: Behavioral Health

## 2021-08-09 ENCOUNTER — Telehealth: Payer: Self-pay | Admitting: Behavioral Health

## 2021-08-09 NOTE — Telephone Encounter (Signed)
Attempt to reach Pt today is unsuccessful. Phone sts, "call cannot be completed". Pt will need to [facility] to [facility] & schedule w/New Provider as needed.  Dr. Theodis Shove

## 2021-08-20 ENCOUNTER — Telehealth: Payer: Self-pay

## 2021-08-20 NOTE — Telephone Encounter (Signed)
Pt is requesting a call back .Marland Kitchen She was  calling  to make an appt  with Mrs winstead ...   She is in need help and needs to find a  new therapy

## 2021-08-30 ENCOUNTER — Ambulatory Visit: Payer: Medicare HMO

## 2021-08-30 ENCOUNTER — Encounter: Payer: Medicare HMO | Admitting: Internal Medicine

## 2021-09-05 ENCOUNTER — Encounter: Payer: Medicare HMO | Admitting: Internal Medicine

## 2021-09-06 ENCOUNTER — Encounter: Payer: Medicare HMO | Admitting: Internal Medicine

## 2021-09-06 NOTE — Progress Notes (Deleted)
Olivia Ewing is a 71 year old with past medical history of asthma, depression, hypertension, history of atypical squamous cells  Asthma Medications include albuterol inhaler  Depression Medications include sertraline 50 mg daily She want medications in 90-day increments? Referral to [facility] phone number is [phone]  Hypertension Medications include lisinopril/hydrochlorothiazide 20-12.5 mg daily, amlodipine 5 mg daily  Atypical squamous cells Needs follow-up with [facility] Address: [address] [place], [place] [address] Phone: [phone]  HLD Medications include rosuvastatin 20 mg.  On chart review this medication has not been [place] since January. Medication initially started for LDL being greater then 190. Lab Results  Component Value Date   CHOL 198 05/08/2021   HDL 42 05/08/2021   LDLCALC 126 (H) 05/08/2021   TRIG 166 (H) 05/08/2021   CHOLHDL 4.7 (H) 05/08/2021

## 2021-09-12 ENCOUNTER — Ambulatory Visit (INDEPENDENT_AMBULATORY_CARE_PROVIDER_SITE_OTHER): Payer: Medicare HMO | Admitting: Student

## 2021-09-12 ENCOUNTER — Other Ambulatory Visit: Payer: Self-pay | Admitting: Internal Medicine

## 2021-09-12 ENCOUNTER — Ambulatory Visit: Payer: Medicare HMO

## 2021-09-12 DIAGNOSIS — R110 Nausea: Secondary | ICD-10-CM

## 2021-09-12 DIAGNOSIS — J45909 Unspecified asthma, uncomplicated: Secondary | ICD-10-CM

## 2021-09-12 DIAGNOSIS — K2100 Gastro-esophageal reflux disease with esophagitis, without bleeding: Secondary | ICD-10-CM

## 2021-09-12 DIAGNOSIS — K219 Gastro-esophageal reflux disease without esophagitis: Secondary | ICD-10-CM

## 2021-09-12 NOTE — Progress Notes (Signed)
Attempted to call patient x4 for telehealth visit today/ Unable to reach the patient. Left VM with clinic contact information to call back for visit or to reschedule.

## 2021-09-12 NOTE — Telephone Encounter (Signed)
Patient is scheduled for appointment today. I messaged provider seeing her to ask them to address nausea with her.

## 2021-09-24 ENCOUNTER — Other Ambulatory Visit: Payer: Self-pay

## 2021-09-24 ENCOUNTER — Encounter: Payer: Self-pay | Admitting: Gastroenterology

## 2021-09-24 DIAGNOSIS — R110 Nausea: Secondary | ICD-10-CM

## 2021-09-24 MED ORDER — PROMETHAZINE HCL 25 MG PO TABS: 25.0000 mg | ORAL_TABLET | Freq: Three times a day (TID) | ORAL | 0 refills | Status: DC | PRN

## 2021-09-24 NOTE — Telephone Encounter (Signed)
Patient with follow-up at GI scheduled 9/13. Will refill promethazine at this time.

## 2021-09-27 ENCOUNTER — Ambulatory Visit: Payer: Medicare HMO | Admitting: Orthopedic Surgery

## 2021-10-01 ENCOUNTER — Ambulatory Visit: Payer: Medicare HMO | Admitting: Orthopedic Surgery

## 2021-10-02 ENCOUNTER — Ambulatory Visit (INDEPENDENT_AMBULATORY_CARE_PROVIDER_SITE_OTHER): Payer: Medicare HMO | Admitting: Sports Medicine

## 2021-10-02 VITALS — BP 129/86 | Ht 65.0 in

## 2021-10-02 DIAGNOSIS — M25562 Pain in left knee: Secondary | ICD-10-CM | POA: Diagnosis not present

## 2021-10-02 DIAGNOSIS — M48061 Spinal stenosis, lumbar region without neurogenic claudication: Secondary | ICD-10-CM | POA: Diagnosis not present

## 2021-10-02 MED ORDER — HYDROCODONE-ACETAMINOPHEN 5-325 MG PO TABS: 1.0000 | ORAL_TABLET | Freq: Four times a day (QID) | ORAL | 0 refills | Status: DC | PRN

## 2021-10-02 MED ORDER — CEPHALEXIN 500 MG PO CAPS: 500.0000 mg | ORAL_CAPSULE | Freq: Three times a day (TID) | ORAL | 0 refills | Status: DC

## 2021-10-03 NOTE — Progress Notes (Signed)
   Subjective:    [name], female    DOB: 08-Jul-1950, 71 y.o.   MRN: [id number]  HPI chief complaint: Left knee pain  Jovi presents today with persistent left knee pain.  Knee was aspirated and injected in June of this year.  Although her swelling has returned, it is not as bad as in June.  However, she continues to have significant medial sided knee pain with activity.  She also noticed a small area of drainage shortly after the injection at the point of entry of the needle.  This concerned her because she has a history of postoperative sepsis after a right shoulder scope many years ago.  No fevers or chills.  The area is still slightly sore but not limiting her activity.  Her main complaint is medial sided knee pain and swelling.  Hydrocodone does seem to help and she takes it responsibly.   Review of Systems As above    Objective:   Physical Exam  Well-developed, well-nourished.  No acute distress  Left knee: Good range of motion.  1+ boggy synovitis but no gross effusion.  There is a 2 cm area of skin discoloration at the point of entry of the needle from her previous knee aspiration in June.  It is not tender to palpation.  No drainage.  It is not fluctuant.  Not warm to touch.  She is tender to palpation along the medial joint line and has bony hypertrophy consistent with DJD here.  Pain but no popping with McMurray's.  Neurovascularly intact distally.  Ambulating with a slight limp.      Assessment & Plan:  Persistent left knee pain secondary to DJD  X-rays of the left knee to evaluate the degree of DJD present.  She would be a good candidate for a total knee arthroplasty if warranted.  She is somewhat hesitant about proceeding with this given her postoperative sepsis with her right shoulder arthroscopy.  I reassured her that I see no active signs of infection from her previous knee injection which was performed over 2 months ago.  Nonetheless, given her concern,  I have agreed to prescribe a 10-day course of Keflex with instructions to take 500 mg 3 times daily.  She is instructed to return to the office if she does notice any sort of abscess forming here.  I have also given her a refill on her hydrocodone (20 pills).  Phone follow-up with her after I review her x-ray we will discuss further steps in treatment based on those findings.  This note was dictated using Dragon naturally speaking software and may contain errors in syntax, spelling, or content which have not been identified prior to signing this note.

## 2021-10-04 ENCOUNTER — Ambulatory Visit: Payer: Medicare HMO | Admitting: Orthopedic Surgery

## 2021-10-08 ENCOUNTER — Ambulatory Visit: Payer: Medicare HMO | Admitting: Orthopedic Surgery

## 2021-10-11 ENCOUNTER — Ambulatory Visit: Payer: Medicare HMO | Admitting: Orthopedic Surgery

## 2021-10-19 ENCOUNTER — Other Ambulatory Visit: Payer: Self-pay | Admitting: Internal Medicine

## 2021-10-19 DIAGNOSIS — R110 Nausea: Secondary | ICD-10-CM

## 2021-10-19 NOTE — Telephone Encounter (Signed)
Please have patient follow-up in clinic

## 2021-10-24 ENCOUNTER — Ambulatory Visit: Payer: Medicare HMO | Admitting: Gastroenterology

## 2021-10-29 ENCOUNTER — Ambulatory Visit
Admission: RE | Admit: 2021-10-29 | Discharge: 2021-10-29 | Disposition: A | Discharge status: Home / Self Care | Payer: Medicare HMO | Source: Ambulatory Visit | Attending: Sports Medicine | Admitting: Sports Medicine

## 2021-10-29 DIAGNOSIS — M25562 Pain in left knee: Secondary | ICD-10-CM

## 2021-11-01 ENCOUNTER — Ambulatory Visit: Payer: Medicare HMO | Admitting: Sports Medicine

## 2021-11-01 ENCOUNTER — Telehealth: Payer: Self-pay | Admitting: Sports Medicine

## 2021-11-01 NOTE — Telephone Encounter (Signed)
  I left a message on Olivia Ewing's voicemail yesterday after reviewing x-rays of her left knee.  She has only mild degenerative changes but a moderate-sized effusion.  She has had this knee aspirated and injected several times but her swelling and pain persist.  I recommended an MRI to evaluate further.  I will follow-up with her via telephone with those results when available and we will delineate further treatment based on those findings.  This note was dictated using Dragon naturally speaking software and may contain errors in syntax, spelling, or content which have not been identified prior to signing this note.

## 2021-11-06 ENCOUNTER — Ambulatory Visit: Payer: Medicare HMO | Admitting: Sports Medicine

## 2021-11-06 ENCOUNTER — Other Ambulatory Visit: Payer: Self-pay | Admitting: Sports Medicine

## 2021-11-06 ENCOUNTER — Other Ambulatory Visit: Payer: Self-pay | Admitting: *Deleted

## 2021-11-06 ENCOUNTER — Other Ambulatory Visit: Payer: Self-pay | Admitting: Internal Medicine

## 2021-11-06 DIAGNOSIS — M25562 Pain in left knee: Secondary | ICD-10-CM

## 2021-11-06 DIAGNOSIS — M48061 Spinal stenosis, lumbar region without neurogenic claudication: Secondary | ICD-10-CM

## 2021-11-06 DIAGNOSIS — Z1231 Encounter for screening mammogram for malignant neoplasm of breast: Secondary | ICD-10-CM

## 2021-11-06 MED ORDER — HYDROCODONE-ACETAMINOPHEN 5-325 MG PO TABS: 1.0000 | ORAL_TABLET | Freq: Four times a day (QID) | ORAL | 0 refills | Status: DC | PRN

## 2021-11-13 ENCOUNTER — Ambulatory Visit: Payer: Medicare HMO

## 2021-11-18 ENCOUNTER — Ambulatory Visit
Admission: RE | Admit: 2021-11-18 | Discharge: 2021-11-18 | Disposition: A | Discharge status: Home / Self Care | Payer: Medicaid Other | Source: Ambulatory Visit | Attending: Sports Medicine | Admitting: Sports Medicine

## 2021-11-18 DIAGNOSIS — M25562 Pain in left knee: Secondary | ICD-10-CM

## 2021-11-19 ENCOUNTER — Other Ambulatory Visit: Payer: Self-pay | Admitting: Internal Medicine

## 2021-11-19 ENCOUNTER — Other Ambulatory Visit: Payer: Self-pay | Admitting: Specialist

## 2021-11-19 DIAGNOSIS — R110 Nausea: Secondary | ICD-10-CM

## 2021-11-19 DIAGNOSIS — K219 Gastro-esophageal reflux disease without esophagitis: Secondary | ICD-10-CM

## 2021-11-19 DIAGNOSIS — J45909 Unspecified asthma, uncomplicated: Secondary | ICD-10-CM

## 2021-11-19 NOTE — Telephone Encounter (Signed)
Patient with follow-up 10/23 for chronic nausea. Famotidine, albuterol, and promethazine refilled.

## 2021-11-20 ENCOUNTER — Ambulatory Visit: Payer: Medicare HMO | Admitting: Sports Medicine

## 2021-11-20 ENCOUNTER — Other Ambulatory Visit: Payer: Self-pay | Admitting: Sports Medicine

## 2021-11-20 DIAGNOSIS — M48061 Spinal stenosis, lumbar region without neurogenic claudication: Secondary | ICD-10-CM

## 2021-11-20 MED ORDER — HYDROCODONE-ACETAMINOPHEN 5-325 MG PO TABS: 1.0000 | ORAL_TABLET | Freq: Four times a day (QID) | ORAL | 0 refills | Status: AC | PRN

## 2021-11-21 ENCOUNTER — Ambulatory Visit: Payer: Medicare HMO | Admitting: Gastroenterology

## 2021-11-22 ENCOUNTER — Ambulatory Visit (INDEPENDENT_AMBULATORY_CARE_PROVIDER_SITE_OTHER): Payer: Medicare Other | Admitting: Sports Medicine

## 2021-11-22 VITALS — Ht 65.0 in

## 2021-11-22 DIAGNOSIS — M25562 Pain in left knee: Secondary | ICD-10-CM | POA: Diagnosis not present

## 2021-11-22 NOTE — Progress Notes (Signed)
[name], female   DOB: 1950/09/23, 71 y.o.   MRN: [id number]  [name] presents today to discuss MRI findings of her left knee.  MRI shows tearing through both the medial and lateral menisci.  She also has moderate to severe tricompartmental degenerative changes.  She has failed conservative treatment to date so I will refer her to Dr. Erlinda Hong to discuss possible surgical options.  She is in agreement with that plan.  I will give her a refill on her hydrocodone and I will defer further work-up and treatment to the discretion of Dr.Xu.

## 2021-11-29 ENCOUNTER — Telehealth: Payer: Self-pay

## 2021-11-29 ENCOUNTER — Ambulatory Visit (INDEPENDENT_AMBULATORY_CARE_PROVIDER_SITE_OTHER): Payer: Medicare Other | Admitting: Orthopaedic Surgery

## 2021-11-29 DIAGNOSIS — M1712 Unilateral primary osteoarthritis, left knee: Secondary | ICD-10-CM

## 2021-11-29 MED ORDER — METHYLPREDNISOLONE ACETATE 40 MG/ML IJ SUSP
40.0000 mg | INTRAMUSCULAR | Status: AC | PRN
  Administered 2021-11-29: 40 mg via INTRA_ARTICULAR

## 2021-11-29 MED ORDER — BUPIVACAINE HCL 0.5 % IJ SOLN
2.0000 mL | INTRAMUSCULAR | Status: AC | PRN
  Administered 2021-11-29: 2 mL via INTRA_ARTICULAR

## 2021-11-29 MED ORDER — HYDROCODONE-ACETAMINOPHEN 5-325 MG PO TABS: 1.0000 | ORAL_TABLET | Freq: Every day | ORAL | 0 refills | Status: DC | PRN

## 2021-11-29 MED ORDER — LIDOCAINE HCL 1 % IJ SOLN
2.0000 mL | INTRAMUSCULAR | Status: AC | PRN
  Administered 2021-11-29: 2 mL

## 2021-11-29 NOTE — Progress Notes (Signed)
Office Visit Note   Patient: Olivia Ewing           Date of Birth: 08/13/1950           MRN: [id number] Visit Date: 11/29/2021              Requested by: Olivia Ewing, [place], DO [address] [place],  [place] [address] PCP: Olivia Fuchs, DO   Assessment & Plan: Visit Diagnoses:  1. Primary osteoarthritis of left knee     Plan: Impression is left knee degenerative joint disease with effusion.  Disease process and treatment options were explained in detail.  Currently she is not in a great social situation with her daughter and is not sure about where she will be living there for we agreed that a knee replacement is not the best option right now.  I aspirated her knee and injected cortisone today.  I obtained about 20 cc of synovial fluid which was sent to the lab.  We will also get approval for viscosupplementation.  Total face to face encounter time was greater than 25 minutes and over half of this time was spent in counseling and/or coordination of care.  This patient is diagnosed with osteoarthritis of the knee(s).    Radiographs show evidence of joint space narrowing, osteophytes, subchondral sclerosis and/or subchondral cysts.  This patient has knee pain which interferes with functional and activities of daily living.    This patient has experienced inadequate response, adverse effects and/or intolerance with conservative treatments such as acetaminophen, NSAIDS, topical creams, physical therapy or regular exercise, knee bracing and/or weight loss.   This patient has experienced inadequate response or has a contraindication to intra articular steroid injections for at least 3 months.   This patient is not scheduled to have a total knee replacement within 6 months of starting treatment with viscosupplementation.  Follow-Up Instructions: No follow-ups on file.   Orders:  No orders of the defined types were placed in this encounter.  No orders of the defined types were  placed in this encounter.     Procedures: Large Joint Inj: L knee on 11/29/2021 10:55 AM Details: 22 G needle Medications: 2 mL bupivacaine 0.5 %; 2 mL lidocaine 1 %; 40 mg methylPREDNISolone acetate 40 MG/ML Outcome: tolerated well, no immediate complications Patient was prepped and draped in the usual sterile fashion.       Clinical Data: No additional findings.   Subjective: Chief Complaint  Patient presents with   Left Knee - Pain    HPI Olivia Ewing is a 71 year old female who is well-known to me who comes in for evaluation of chronic severe left knee pain.  She is referral from Dr. Micheline Ewing.  She has undergone cortisone injection and aspiration earlier this year that provided temporary relief.  She feels that over-the-counter medications do not help.  Currently walks with a cane.  Recently had an MRI which showed fairly advanced chondromalacia as well as meniscal tears.  Review of Systems  Constitutional: Negative.   HENT: Negative.    Eyes: Negative.   Respiratory: Negative.    Cardiovascular: Negative.   Endocrine: Negative.   Musculoskeletal: Negative.   Neurological: Negative.   Hematological: Negative.   Psychiatric/Behavioral: Negative.    All other systems reviewed and are negative.    Objective: Vital Signs: There were no vitals taken for this visit.  Physical Exam Vitals and nursing note reviewed.  Constitutional:      Appearance: She is well-developed.  HENT:  Head: Atraumatic.     Nose: Nose normal.  Eyes:     Extraocular Movements: Extraocular movements intact.  Cardiovascular:     Pulses: Normal pulses.  Pulmonary:     Effort: Pulmonary effort is normal.  Abdominal:     Palpations: Abdomen is soft.  Musculoskeletal:     Cervical back: Neck supple.  Skin:    General: Skin is warm.     Capillary Refill: Capillary refill takes less than 2 seconds.  Neurological:     Mental Status: She is alert. Mental status is at baseline.   Psychiatric:        Behavior: Behavior normal.        Thought Content: Thought content normal.        Judgment: Judgment normal.     Ortho Exam Examination of the left knee shows moderate joint effusion.  She has pain and patellofemoral crepitus throughout range of motion.  There is medial and lateral joint line tenderness.  Collaterals and cruciates are stable.  No evidence of infection. Specialty Comments:  No specialty comments available.  Imaging: No results found.   PMFS History: Patient Active Problem List   Diagnosis Date Noted   Primary osteoarthritis of left knee 11/29/2021   Trigger finger, right ring finger 04/10/2021   Trigger finger, left ring finger 02/26/2021   Asthma 12/13/2020   GERD (gastroesophageal reflux disease) 12/13/2020   Housing instability 12/13/2020   Atypical squamous cells cannot exclude high grade squamous intraepithelial lesion on cytologic smear of cervix (ASC-H) 09/01/2020   Vaginal itching 09/01/2020   De Quervain's disease (tenosynovitis) 08/22/2020   Insomnia 06/08/2020   Septic arthritis ([place]) 02/25/2020   Medication monitoring encounter 02/25/2020   S/P arthroscopy of right shoulder 01/05/2020   Carpal tunnel syndrome, left upper limb 10/25/2019    Class: Chronic   Carpal tunnel syndrome, right upper limb 10/25/2019    Class: Chronic   Tendinopathy of right biceps tendon 07/27/2019   Breast cancer screening by mammogram 06/07/2019   Seasonal allergic rhinitis due to pollen 06/07/2019   Colon cancer screening 06/07/2019   Spinal stenosis, lumbar region, with neurogenic claudication 03/16/2019    Class: Chronic   Acute stress reaction 09/25/2018   Vitamin D insufficiency 11/25/2013   Healthcare maintenance 11/25/2013   Depression 09/23/2011   Functional incontinence 10/18/2010   Pain in right leg 10/18/2010   Iron deficiency anemia 05/12/2008   Hyperlipemia 06/18/2007   Essential hypertension 05/20/2007   Past Medical History:   Diagnosis Date   Anemia    Arthritis    knees hands   Arthritis, degenerative 11/17/2012   Arthrosis of right acromioclavicular joint 06/05/2012   Bipolar disorder ([place])    Cervicalgia    Depression    Environmental allergies    cause SOB, uses inhaler for   Environmental and seasonal allergies    uses inhaler prn   Full dentures    GERD (gastroesophageal reflux disease)    diet controlled - no meds   Hyperlipidemia    diet controlled, no meds   Hypertension    Left knee DJD, degenerative meniscus tear 04/06/2012   Steroid injections: 09/2013 12/2013    Lumbar radiculopathy, chronic    Nontraumatic incomplete tear of right rotator cuff 07/27/2019   Primary osteoarthritis of right hip 04/27/2018   Right knee meniscal tear 03/14/2011   Right knee pain    posterior horn medial meniscal tear MRI 2013   Spinal stenosis    getting epidural injections -last one  06/12/2015   Status post lumbar laminectomy 03/16/2019   Status post total hip replacement, 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   SVD (spontaneous vaginal delivery)    x 4    Family History  Problem Relation Age of Onset   Hypertension Mother     Past Surgical History:  Procedure Laterality Date   APPENDECTOMY     Bladder tack  10/2006   cystocoele   BREAST SURGERY Right    benign cyst   CARPAL TUNNEL RELEASE Left 10/25/2019   Procedure: LEFT CARPAL TUNNEL RELEASE;  Surgeon: Jessy Oto, MD;  Location: [facility];  Service: Orthopedics;  Laterality: Left;   COLONOSCOPY     Hysterectomy other     IRRIGATION AND DEBRIDEMENT SHOULDER Right 02/21/2020   Procedure: IRRIGATION AND DEBRIDEMENT RIGHT SHOULDER;  Surgeon: Leandrew Koyanagi, MD;  Location: [facility];  Service: Orthopedics;  Laterality: Right;   LUMBAR LAMINECTOMY N/A 03/16/2019   Procedure: CENTRAL LAMINECTOMIES L2-3, L3-4 AND L4-5;  Surgeon: Jessy Oto, MD;  Location: [facility];  Service: Orthopedics;  Laterality: N/A;   TOTAL HIP ARTHROPLASTY Right 04/27/2018    Procedure: RIGHT TOTAL HIP ARTHROPLASTY ANTERIOR APPROACH;  Surgeon: Leandrew Koyanagi, MD;  Location: [facility];  Service: Orthopedics;  Laterality: Right;   TUBAL LIGATION     Social History   Occupational History   Not on file  Tobacco Use   Smoking status: Former    Packs/day: 0.10    Types: Cigarettes    Quit date: 11/26/1983    Years since quitting: 38.0   Smokeless tobacco: Never  Vaping Use   Vaping Use: Never used  Substance and Sexual Activity   Alcohol use: Yes    Alcohol/week: 0.0 standard drinks of alcohol    Comment: rarely   Drug use: Not Currently    Types: Marijuana    Comment: last used 2019   Sexual activity: Not Currently    Birth control/protection: Surgical

## 2021-11-29 NOTE — Telephone Encounter (Signed)
Can we get patient approved for L knee inj

## 2021-11-30 NOTE — Telephone Encounter (Signed)
VOB submitted for Durolane, left knee.  

## 2021-12-03 ENCOUNTER — Encounter: Payer: Self-pay | Admitting: Internal Medicine

## 2021-12-03 ENCOUNTER — Encounter: Payer: Medicare HMO | Admitting: Internal Medicine

## 2021-12-05 LAB — ANAEROBIC AND AEROBIC CULTURE
AER RESULT:: NO GROWTH
MICRO NUMBER:: 14073907
MICRO NUMBER:: 14073908
SPECIMEN QUALITY:: ADEQUATE
SPECIMEN QUALITY:: ADEQUATE

## 2021-12-05 LAB — SYNOVIAL FLUID ANALYSIS, COMPLETE
Basophils, %: 0 %
Eosinophils-Synovial: 3 % — ABNORMAL HIGH (ref 0–2)
Lymphocytes-Synovial Fld: 53 % (ref 0–74)
Monocyte/Macrophage: 15 % (ref 0–69)
Neutrophil, Synovial: 29 % — ABNORMAL HIGH (ref 0–24)
Synoviocytes, %: 0 % (ref 0–15)
WBC, Synovial: 204 cells/uL — ABNORMAL HIGH (ref <150)

## 2021-12-12 ENCOUNTER — Ambulatory Visit (INDEPENDENT_AMBULATORY_CARE_PROVIDER_SITE_OTHER): Payer: Medicare Other

## 2021-12-12 ENCOUNTER — Encounter: Payer: Self-pay | Admitting: Internal Medicine

## 2021-12-12 ENCOUNTER — Ambulatory Visit (INDEPENDENT_AMBULATORY_CARE_PROVIDER_SITE_OTHER): Payer: Medicare Other | Admitting: Internal Medicine

## 2021-12-12 ENCOUNTER — Other Ambulatory Visit: Payer: Self-pay

## 2021-12-12 VITALS — BP 115/81 | HR 89 | Temp 98.4°F | Ht 66.0 in | Wt 185.0 lb

## 2021-12-12 DIAGNOSIS — G4700 Insomnia, unspecified: Secondary | ICD-10-CM | POA: Diagnosis not present

## 2021-12-12 DIAGNOSIS — R87610 Atypical squamous cells of undetermined significance on cytologic smear of cervix (ASC-US): Secondary | ICD-10-CM

## 2021-12-12 DIAGNOSIS — Z59819 Housing instability, housed unspecified: Secondary | ICD-10-CM

## 2021-12-12 DIAGNOSIS — Z23 Encounter for immunization: Secondary | ICD-10-CM | POA: Diagnosis not present

## 2021-12-12 DIAGNOSIS — Z0000 Encounter for general adult medical examination without abnormal findings: Secondary | ICD-10-CM

## 2021-12-12 DIAGNOSIS — F32A Depression, unspecified: Secondary | ICD-10-CM

## 2021-12-12 DIAGNOSIS — R87611 Atypical squamous cells cannot exclude high grade squamous intraepithelial lesion on cytologic smear of cervix (ASC-H): Secondary | ICD-10-CM | POA: Diagnosis not present

## 2021-12-12 MED ORDER — SERTRALINE HCL 100 MG PO TABS: 200.0000 mg | ORAL_TABLET | Freq: Every day | ORAL | 3 refills | Status: DC

## 2021-12-12 NOTE — Progress Notes (Signed)
Subjective:  CC: depression, housing   HPI:  [name] is a 71 y.o. female with a past medical history stated below and presents today for several long term problems. She has not been able to come to clinic for some time due to losing housing and several other life events. Please see problem based assessment and plan for additional details.  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   Steroid injections: 09/2013 12/2013    Lumbar radiculopathy, chronic    Nontraumatic incomplete tear of right rotator cuff 07/27/2019   Primary osteoarthritis of right hip 04/27/2018   Right knee meniscal tear 03/14/2011   Right knee pain    posterior horn medial meniscal tear MRI 2013   Septic arthritis ([place]) 02/25/2020   Spinal stenosis    getting epidural injections -last one 06/12/2015   Status post lumbar laminectomy 03/16/2019   Status post total hip replacement, 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 90 tablet 2   famotidine (PEPCID) 20 MG tablet TAKE 1 TABLET BY MOUTH TWICE A DAY 60 tablet 2   ferrous gluconate (FERGON) 324 MG tablet Take 1 tablet  (324 mg total) by mouth daily with breakfast. 60 tablet 1   fluticasone (FLONASE) 50 MCG/ACT nasal spray PLACE 1-2 SPRAYS INTO BOTH NOSTRILS DAILY. 16 mL 3   fluticasone (FLOVENT HFA) 44 MCG/ACT inhaler Inhale 1 puff into the lungs 2 (two) times daily. 1 each 2   gabapentin (NEURONTIN) 300 MG capsule TAKE 1 CAPSULE BY MOUTH THREE TIMES A DAY 270 capsule 3   HYDROcodone-acetaminophen (NORCO) 5-325 MG tablet Take 1 tablet by mouth daily as needed. 10 tablet 0   lisinopril-hydrochlorothiazide (ZESTORETIC) 20-12.5 MG tablet Take 1 tablet by mouth daily. 90 tablet 2   loratadine (CLARITIN) 10 MG tablet Take 1 tablet (10 mg total) by mouth daily. 30 tablet 0   Multiple Vitamin (MULTIVITAMIN WITH MINERALS) TABS tablet Take 1 tablet by mouth daily.     pantoprazole (PROTONIX) 40 MG tablet TAKE 1 TABLET BY MOUTH TWICE A DAY 60 tablet 2   promethazine (PHENERGAN) 25 MG tablet TAKE 1 TABLET BY MOUTH EVERY 8 HOURS AS NEEDED 30 tablet 0   rosuvastatin (CRESTOR) 20 MG tablet Take 1 tablet (20 mg total) by mouth daily. 90 tablet 1   sodium chloride (OCEAN) 0.65 % SOLN nasal spray Place 1 spray into both nostrils 2 (two) times daily as needed. 30 mL 0   No current facility-administered medications on file prior to  visit.    Family History  Problem Relation Age of Onset   Hypertension Mother     Social History   Socioeconomic History   Marital status: Divorced    Spouse name: Not on file   Number of children: 4   Years of education: Not on file   Highest education level: Not on file  Occupational History   Not on file  Tobacco Use   Smoking status: Former    Packs/day: 0.10    Types: Cigarettes    Quit date: 11/26/1983    Years since quitting: 38.0   Smokeless tobacco: Never  Vaping Use   Vaping Use: Never used  Substance and Sexual Activity   Alcohol use: Yes    Alcohol/week: 0.0 standard drinks of alcohol    Comment: rarely   Drug use: Not Currently    Types: Marijuana    Comment: last  used 2019   Sexual activity: Not Currently    Birth control/protection: Surgical  Other Topics Concern   Not on file  Social History Narrative   Single. 9 siblings. 1 brother with HIV, 1 sister with hypertension. 4 children, 1 daughter with HTN and depression, 1 son with HTN.   [phone]- shelter's number.    Former smoker, no alcohol or drug use.   Social Determinants of Health   Financial Resource Strain: Low Risk  (12/12/2021)   Overall Financial Resource Strain (CARDIA)    Difficulty of Paying Living Expenses: Not hard at all  Food Insecurity: No Food Insecurity (12/12/2021)   Hunger Vital Sign    Worried About Running Out of Food in the Last Year: Never true    Ran Out of Food in the Last Year: Never true  Transportation Needs: No Transportation Needs (12/27/2020)   PRAPARE - Hydrologist (Medical): No    Lack of Transportation (Non-Medical): No  Physical Activity: Sufficiently Active (12/12/2021)   Exercise Vital Sign    Days of Exercise per Week: 7 days    Minutes of Exercise per Session: 30 min  Stress: Stress Concern Present (12/12/2021)   [place]    Feeling of Stress : Very much  Social Connections: Moderately Integrated (12/12/2021)   Social Connection and Isolation Panel [NHANES]    Frequency of Communication with Friends and Family: More than three times a week    Frequency of Social Gatherings with Friends and Family: More than three times a week    Attends Religious Services: 1 to 4 times per year    Active Member of [employer] or Organizations: No    Attends Archivist Meetings: 1 to 4 times per year    Marital Status: Never married  Intimate Partner Violence: Not At Risk (12/12/2021)   Humiliation, Afraid, Rape, and Kick questionnaire    Fear of Current or Ex-Partner: No    Emotionally Abused: No    Physically Abused: No    Sexually Abused: No    Review of  Systems: ROS negative except for what is noted on the assessment and plan.  Objective:   Vitals:   12/12/21 1316  BP: 115/81  Pulse: 89  Temp: 98.4 F (36.9 C)  TempSrc: Oral  SpO2: 99%  Weight: 185 lb (83.9 kg)  Height: '5\' 6"'$  (1.676 m)    Physical Exam: Constitutional: well-appearing  Cardiovascular: regular rate and rhythm, no m/r/g Pulmonary/Chest: normal work of breathing on room air, lungs clear to auscultation bilaterally  Abdominal: soft, non-tender, non-distended, lichenified rash on abd with excoriations, no erythema MSK: normal bulk and tone  Assessment & Plan:  Housing instability She continues to have difficulty with housing. She was living with her great-aunt who passed away last year. She is now living with her daughter and grand-daughter. She has turned in an application for housing. I have sent message to [facility] social worker about patient. Last note 12/22.  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 I talked with her about following up with GYN and she states that she is at a better spot in life now and is open to referral. A/P: Referral to GYN  Insomnia She continues to have difficulties with insomnia. She currently sleeps on the couch in her daughters living room. Her sleep is frequently disrupted as family members go through living room and music is played. She has not tried ear plugs or white noise. She does not use a sleep mask. She has been taking seroquel for several years since going to [employer]. She does not find this medication helpful. She also works 2 jobs and sometimes works late into the night. A/P: There are many factors outside of her control that make sleep hygiene difficult for her. I talked with her about trying ear plugs and sleep masks. She would like to continue seroquel despite this not working. Could consider trazodone at  follow-up if she is interested. -continue seroquel 100 mg nightly  Depression PHQ-9 elevated at 17. She continues to have trouble sleeping, decreased appetite. Her son was recently sentenced for 40 years and she is very upset by this. She is interested in therapy as she used to follow with Dr. Theodis Shove. A/P: Increase sertraline from 150 mg to 200 mg Referral to psychiatry  Healthcare maintenance Flu vaccine received    Patient discussed with Dr. Brent [facility] Miel Wisener, D.O. [facility]  PGY-2 Pager: [phone]  Phone: [phone] Date 12/14/2021  Time 6:06 AM

## 2021-12-12 NOTE — Patient Instructions (Addendum)
Thank you, Ms.[name] for allowing [name] to provide your care today.   Depression  I am referring you to psychiatry. I am increasing Zoloft to '200mg'$ , take this 1 time daily  Nausea Follow-up with GI with Dr. Alonza Bogus on 11/14. Please be sure to follow-up with them.  Insomnia Please try melatonin. You are not in a good sleeping environment right now. Please try using ear plugs or listening to white noise to go to sleep.  Insect bite Please try not to rub this as best you can. Try putting a band aid over. If this is very itchy you can try taking antihistamine such as benadryl to help avoid scratching.  I have ordered the following labs for you:  Lab Orders  No laboratory test(s) ordered today    Referrals ordered today:   Referral Orders         Ambulatory referral to Psychiatry         Ambulatory referral to Obstetrics / Gynecology       I have ordered the following medication/changed the following medications:   Stop the following medications: Medications Discontinued During This Encounter  Medication Reason   diclofenac (VOLTAREN) 75 MG EC tablet Completed Course   metroNIDAZOLE (FLAGYL) 500 MG tablet Completed Course   cephALEXin (KEFLEX) 500 MG capsule Completed Course   predniSONE (STERAPRED UNI-PAK 21 TAB) 10 MG (21) TBPK tablet Completed Course   sertraline (ZOLOFT) 100 MG tablet Reorder     Start the following medications: Meds ordered this encounter  Medications   sertraline (ZOLOFT) 100 MG tablet    Sig: Take 2 tablets (200 mg total) by mouth daily.    Dispense:  180 tablet    Refill:  3     Follow up:  1 month    We look forward to seeing you next time. Please call our clinic at [phone] if you have any questions or concerns. The best time to call is Monday-Friday from 9am-4pm, but there is someone available 24/7. If after hours or the weekend, call the main hospital number and ask for the Internal Medicine Resident On-Call. If you need  medication refills, please notify your pharmacy one week in advance and they will send [name] a request.   Thank you for trusting me with your care. Wishing you the best!   [name], [place]

## 2021-12-12 NOTE — Progress Notes (Signed)
Subjective:   Olivia Ewing is a 71 y.o. female who presents for an Initial Medicare Annual Wellness Visit. I connected with  [name] on 12/12/21 by a  in person imc clinic    Patient Location: Other:  [place]   Provider Location: Office/Clinic  I discussed the limitations of evaluation and management by telemedicine. The patient expressed understanding and agreed to proceed.  Review of Systems    DEFERRED  TO PCP  Cardiac Risk Factors include: advanced age (>28mn, >>42women);hypertension     Objective:    Today's Vitals   12/12/21 1509  PainSc: 8    There is no height or weight on file to calculate BMI.     12/12/2021    3:13 PM 05/08/2021    9:53 AM 09/01/2020    9:40 AM 07/24/2020   10:33 AM 05/10/2020    2:58 PM 04/18/2020    3:44 PM 03/20/2020    1:21 PM  Advanced Directives  Does Patient Have a Medical Advance Directive? No No No No No No No  Would patient like information on creating a medical advance directive? No - Patient declined Yes (MAU/Ambulatory/Procedural Areas - Information given) Yes (ED - Information included in AVS) No - Patient declined No - Patient declined No - Patient declined No - Patient declined    Current Medications (verified) Outpatient Encounter Medications as of 12/12/2021  Medication Sig   albuterol (VENTOLIN HFA) 108 (90 Base) MCG/ACT inhaler INHALE 1-2 PUFFS INTO THE LUNGS EVERY 6 HOURS AS NEEDED SHORTNESS OF BREATH   amLODipine (NORVASC) 5 MG tablet Take 1 tablet (5 mg total) by mouth daily.   famotidine (PEPCID) 20 MG tablet TAKE 1 TABLET BY MOUTH TWICE A DAY   ferrous gluconate (FERGON) 324 MG tablet Take 1 tablet (324 mg total) by mouth daily with breakfast.   fluticasone (FLONASE) 50 MCG/ACT nasal spray PLACE 1-2 SPRAYS INTO BOTH NOSTRILS DAILY.   fluticasone (FLOVENT HFA) 44 MCG/ACT inhaler Inhale 1 puff into the lungs 2 (two) times daily.   gabapentin (NEURONTIN) 300 MG capsule TAKE 1 CAPSULE BY MOUTH THREE TIMES A DAY    HYDROcodone-acetaminophen (NORCO) 5-325 MG tablet Take 1 tablet by mouth daily as needed.   lisinopril-hydrochlorothiazide (ZESTORETIC) 20-12.5 MG tablet Take 1 tablet by mouth daily.   Multiple Vitamin (MULTIVITAMIN WITH MINERALS) TABS tablet Take 1 tablet by mouth daily.   pantoprazole (PROTONIX) 40 MG tablet TAKE 1 TABLET BY MOUTH TWICE A DAY   promethazine (PHENERGAN) 25 MG tablet TAKE 1 TABLET BY MOUTH EVERY 8 HOURS AS NEEDED   QUEtiapine (SEROQUEL) 100 MG tablet TAKE 1 TABLET BY MOUTH EVERY NIGHT AT BEDTIME   rosuvastatin (CRESTOR) 20 MG tablet Take 1 tablet (20 mg total) by mouth daily.   sertraline (ZOLOFT) 100 MG tablet Take 2 tablets (200 mg total) by mouth daily.   sodium chloride (OCEAN) 0.65 % SOLN nasal spray Place 1 spray into both nostrils 2 (two) times daily as needed.   loratadine (CLARITIN) 10 MG tablet Take 1 tablet (10 mg total) by mouth daily.   No facility-administered encounter medications on file as of 12/12/2021.    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   Steroid injections: 09/2013 12/2013    Lumbar radiculopathy, chronic    Nontraumatic incomplete tear of right rotator cuff 07/27/2019   Primary osteoarthritis of right hip 04/27/2018   Right knee meniscal tear 03/14/2011   Right knee pain    posterior horn medial meniscal tear MRI 2013   Spinal stenosis    getting epidural injections -last one 06/12/2015   Status  post lumbar laminectomy 03/16/2019   Status post total hip replacement, 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   SVD (spontaneous vaginal delivery)    x 4   Past Surgical History:  Procedure Laterality Date   APPENDECTOMY     Bladder tack  10/2006   cystocoele   BREAST SURGERY Right    benign cyst   CARPAL TUNNEL RELEASE Left 10/25/2019   Procedure: LEFT CARPAL TUNNEL RELEASE;  Surgeon: Jessy Oto, MD;  Location: [facility];  Service: Orthopedics;  Laterality: Left;   COLONOSCOPY     Hysterectomy other     IRRIGATION AND DEBRIDEMENT SHOULDER Right 02/21/2020   Procedure: IRRIGATION AND DEBRIDEMENT RIGHT SHOULDER;  Surgeon: Leandrew Koyanagi, MD;  Location: [facility];  Service: Orthopedics;  Laterality: Right;   LUMBAR LAMINECTOMY N/A 03/16/2019   Procedure: CENTRAL LAMINECTOMIES L2-3, L3-4 AND L4-5;  Surgeon: Jessy Oto, MD;  Location: [facility];  Service: Orthopedics;  Laterality: N/A;   TOTAL HIP ARTHROPLASTY Right 04/27/2018   Procedure: RIGHT TOTAL HIP ARTHROPLASTY ANTERIOR APPROACH;  Surgeon: Leandrew Koyanagi, MD;  Location: [facility];  Service: Orthopedics;  Laterality: Right;   TUBAL LIGATION     Family History  Problem Relation Age of Onset   Hypertension Mother    Social History   Socioeconomic History   Marital status: Divorced    Spouse name: Not on file   Number of children: 4   Years of education: Not on file   Highest education level: Not on file  Occupational History   Not on file  Tobacco Use   Smoking status: Former    Packs/day: 0.10    Types: Cigarettes    Quit date: 11/26/1983    Years since quitting: 38.0   Smokeless tobacco: Never  Vaping Use   Vaping Use: Never used  Substance and Sexual Activity   Alcohol use: Yes    Alcohol/week: 0.0 standard drinks of alcohol    Comment: rarely   Drug use: Not Currently    Types: Marijuana    Comment: last used 2019   Sexual activity: Not Currently    Birth control/protection: Surgical  Other Topics Concern    Not on file  Social History Narrative   Single. 9 siblings. 1 brother with HIV, 1 sister with hypertension. 4 children, 1 daughter with HTN and depression, 1 son with HTN.   [phone]- shelter's number.    Former smoker, no alcohol or drug use.   Social Determinants of Health   Financial Resource Strain: Low Risk  (12/12/2021)   Overall Financial Resource Strain (CARDIA)    Difficulty of Paying Living Expenses: Not hard at all  Food Insecurity: No Food Insecurity (12/12/2021)   Hunger Vital Sign  Worried About Charity fundraiser in the Last Year: Never true    [place] in the Last Year: Never true  Transportation Needs: No Transportation Needs (12/27/2020)   PRAPARE - Hydrologist (Medical): No    Lack of Transportation (Non-Medical): No  Physical Activity: Sufficiently Active (12/12/2021)   Exercise Vital Sign    Days of Exercise per Week: 7 days    Minutes of Exercise per Session: 30 min  Stress: Stress Concern Present (12/12/2021)   [place]    Feeling of Stress : Very much  Social Connections: Moderately Integrated (12/12/2021)   Social Connection and Isolation Panel [NHANES]    Frequency of Communication with Friends and Family: More than three times a week    Frequency of Social Gatherings with Friends and Family: More than three times a week    Attends Religious Services: 1 to 4 times per year    Active Member of [employer] or Organizations: No    Attends Music therapist: 1 to 4 times per year    Marital Status: Never married    Tobacco Counseling Counseling given: Not Answered   Clinical Intake:  Pre-visit preparation completed: Yes  Pain : 0-10 Pain Score: 8  Pain Type: Chronic pain Pain Location: Knee Pain Orientation: Left Pain Descriptors / Indicators: Constant Pain Onset: Other (comment)     BMI - recorded: 29 Nutritional Status: BMI 25  -29 Overweight Diabetes: No  How often do you need to have someone help you when you read instructions, pamphlets, or other written materials from your doctor or pharmacy?: 1 - Never What is the last grade level you completed in school?: 11 GRADE  Diabetic?NO   Interpreter Needed?: No  Information entered by :: Sholanda Croson   Activities of Daily Living    12/12/2021    3:14 PM 12/12/2021    1:32 PM  In your present state of health, do you have any difficulty performing the following activities:  Hearing? 0 0  Vision? 0 0  Difficulty concentrating or making decisions? 0 0  Walking or climbing stairs? 0 0  Dressing or bathing? 0 0  Doing errands, shopping? 0 0  Preparing Food and eating ? N   Using the Toilet? N   In the past six months, have you accidently leaked urine? N   Do you have problems with loss of bowel control? N   Managing your Medications? N   Managing your Finances? N   Housekeeping or managing your Housekeeping? N     Patient Care Team: Masters, Joellen Jersey, DO as PCP - General Drema Pry as Social Worker  Indicate any recent [employer] you may have received from other than [facility] providers in the past year (date may be approximate).     Assessment:   This is a routine wellness examination for Olivia Ewing.  Hearing/Vision screen No results found.  Dietary issues and exercise activities discussed: Current Exercise Habits: Home exercise routine, Type of exercise: walking, Time (Minutes): 35, Frequency (Times/Week): 7, Weekly Exercise (Minutes/Week): 245, Intensity: Moderate   Goals Addressed   None   Depression Screen    12/12/2021    3:12 PM 12/12/2021    1:28 PM 05/08/2021    9:54 AM 12/13/2020    4:47 PM 09/01/2020    9:38 AM 05/10/2020    2:57 PM 03/20/2020    1:25 PM  PHQ 2/9 Scores  PHQ - 2 Score 5 4 0 0 2 0 6  PHQ- 9 Score 17 17  0 17 0 21    Fall Risk    12/12/2021    3:13 PM 12/12/2021    1:25 PM 05/08/2021    9:54 AM 12/13/2020    4:47 PM  09/01/2020    9:38 AM  Fall Risk   Falls in the past year? 0 0 0 0 0  Number falls in past yr: 0 0  0   Injury with Fall? 0 0  0   Risk for fall due to : No Fall Risks No Fall Risks No Fall Risks No Fall Risks   Follow up Falls evaluation completed;Falls prevention discussed Falls evaluation completed;Falls prevention discussed Falls evaluation completed Falls evaluation completed     FALL RISK PREVENTION PERTAINING TO THE HOME:  Any stairs in or around the home? Yes  If so, are there any without handrails? No  Home free of loose throw rugs in walkways, pet beds, electrical cords, etc? Yes  Adequate lighting in your home to reduce risk of falls? Yes   ASSISTIVE DEVICES UTILIZED TO PREVENT FALLS:  Life alert? No  Use of a cane, walker or w/c? Yes  Grab bars in the bathroom? No  Shower chair or bench in shower? No  Elevated toilet seat or a handicapped toilet? No   TIMED UP AND GO:  Was the test performed? No .  Length of time to ambulate 10 feet: N/A sec.     Cognitive Function:        12/12/2021    3:14 PM  6CIT Screen  What Year? 0 points  What month? 0 points  What time? 0 points  Count back from 20 0 points  Months in reverse 0 points  Repeat phrase 0 points  Total Score 0 points    Immunizations Immunization History  Administered Date(s) Administered   Fluad Quad(high Dose 65+) 10/31/2020, 12/12/2021   Influenza Whole 11/14/2008, 10/18/2010   Influenza,inj,Quad PF,6+ Mos 05/02/2015, 01/15/2018, 01/27/2019   Pneumococcal Conjugate-13 01/15/2018   Pneumococcal Polysaccharide-23 07/24/2020   Td 08/19/2008    TDAP status: Due, Education has been provided regarding the importance of this vaccine. Advised may receive this vaccine at local pharmacy or Health Dept. Aware to provide a copy of the vaccination record if obtained from local pharmacy or Health Dept. Verbalized acceptance and understanding.  Flu Vaccine status: Up to date  Pneumococcal vaccine  status: Up to date  Covid-19 vaccine status: Completed vaccines  Qualifies for Shingles Vaccine? No   Zostavax completed No   Shingrix Completed?: No.    Education has been provided regarding the importance of this vaccine. Patient has been advised to call insurance company to determine out of pocket expense if they have not yet received this vaccine. Advised may also receive vaccine at local pharmacy or Health Dept. Verbalized acceptance and understanding.  Screening Tests Health Maintenance  Topic Date Due   COVID-19 Vaccine (1) Never done   Zoster Vaccines- Shingrix (1 of 2) Never done   MAMMOGRAM  01/10/2013   TETANUS/TDAP  08/20/2018   COLONOSCOPY (Pts 45-42yr Insurance coverage will need to be confirmed)  03/02/2019   Medicare Annual Wellness (AWV)  12/13/2022   Pneumonia Vaccine 70 Years old  Completed   INFLUENZA VACCINE  Completed   DEXA SCAN  Completed   Hepatitis C Screening  Completed   HPV [place]  Maintenance Due  Topic Date Due   COVID-19 Vaccine (1) Never done   Zoster Vaccines- Shingrix (1 of 2) Never done   MAMMOGRAM  01/10/2013   TETANUS/TDAP  08/20/2018   COLONOSCOPY (Pts 45-4yr Insurance coverage will need to be confirmed)  03/02/2019   Colorectal cancer screening: Type of screening: Colonoscopy. Completed 03/01/2009. Repeat OVER DUE    Mammogram status: Completed 01/11/2011. Repeat every 2 year   Bone Density status: Completed 01/11/2011. Results reflect: Bone density results: NORMAL.   Lung Cancer Screening: (Low Dose CT Chest recommended if Age 70-80years, 30 pack-year currently smoking OR have quit w/in 15years.) does qualify.   Lung Cancer Screening Referral: DEFERRED TO PCP   Additional Screening:  Hepatitis C Screening: does qualify; Completed 11/14/2008  Vision Screening: Recommended annual ophthalmology exams for early detection of glaucoma and other disorders of the eye. Is the patient up to  date with their annual eye exam?  Yes  Who is the provider or what is the name of the office in which the patient attends annual eye exams? PT  DOES NOT RECALL  If pt is not established with a provider, would they like to be referred to a provider to establish care? No .   Dental Screening: Recommended annual dental exams for proper oral hygiene  Community Resource Referral / Chronic Care Management: CRR required this visit?  No   CCM required this visit?  No      Plan:     I have personally reviewed and noted the following in the patient's chart:   Medical and social history Use of alcohol, tobacco or illicit drugs  Current medications and supplements including opioid prescriptions. Patient is currently taking opioid prescriptions. Information provided to patient regarding non-opioid alternatives. Patient advised to discuss non-opioid treatment plan with their provider. Functional ability and status Nutritional status Physical activity Advanced directives List of other physicians Hospitalizations, surgeries, and ER visits in previous 12 months Vitals Screenings to include cognitive, depression, and falls Referrals and appointments  In addition, I have reviewed and discussed with patient certain preventive protocols, quality metrics, and best practice recommendations. A written personalized care plan for preventive services as well as general preventive health recommendations were provided to patient.     aJudyann Munson CMA   12/12/2021   Nurse Notes: [facility]    Olivia Ewing, Thank you for taking time to come for your Medicare Wellness Visit. I appreciate your ongoing commitment to your health goals. Please review the following plan we discussed and let me know if I can assist you in the future.   These are the goals we discussed:  Goals   None     This is a list of the screening recommended for you and due dates:  Health Maintenance  Topic Date Due    COVID-19 Vaccine (1) Never done   Zoster (Shingles) Vaccine (1 of 2) Never done   Mammogram  01/10/2013   Tetanus Vaccine  08/20/2018   Colon Cancer Screening  03/02/2019   Medicare Annual Wellness Visit  12/13/2022   Pneumonia Vaccine  Completed   Flu Shot  Completed   DEXA scan (bone density measurement)  Completed   Hepatitis C Screening: USPSTF Recommendation to screen - Ages 163-79yo.  Completed   HPV Vaccine  Aged Out

## 2021-12-12 NOTE — Patient Instructions (Signed)

## 2021-12-13 ENCOUNTER — Telehealth: Payer: Self-pay | Admitting: *Deleted

## 2021-12-13 ENCOUNTER — Other Ambulatory Visit: Payer: Self-pay | Admitting: Internal Medicine

## 2021-12-13 DIAGNOSIS — K219 Gastro-esophageal reflux disease without esophagitis: Secondary | ICD-10-CM

## 2021-12-13 DIAGNOSIS — F32A Depression, unspecified: Secondary | ICD-10-CM

## 2021-12-13 NOTE — Telephone Encounter (Signed)
Patient has been on seroquel for several years. This was started when she followed with [facility]. Refill sent in

## 2021-12-13 NOTE — Telephone Encounter (Signed)
Patient called in stating she received the high dose flu vaccine yesterday and today site is itching and swollen. She is advised to elevate and apply ice to arm. May try benadryl cream or hydrocortisone cream to site. She is very [employer] attorney.

## 2021-12-14 ENCOUNTER — Encounter: Payer: Self-pay | Admitting: Internal Medicine

## 2021-12-14 ENCOUNTER — Other Ambulatory Visit: Payer: Self-pay | Admitting: Internal Medicine

## 2021-12-14 DIAGNOSIS — R110 Nausea: Secondary | ICD-10-CM

## 2021-12-14 NOTE — Assessment & Plan Note (Signed)
PHQ-9 elevated at 17. She continues to have trouble sleeping, decreased appetite. Her son was recently sentenced for 40 years and she is very upset by this. She is interested in therapy as she used to follow with Dr. Theodis Shove. A/P: Increase sertraline from 150 mg to 200 mg Referral to psychiatry

## 2021-12-14 NOTE — Assessment & Plan Note (Signed)
She continues to have difficulties with insomnia. She currently sleeps on the couch in her daughters living room. Her sleep is frequently disrupted as family members go through living room and music is played. She has not tried ear plugs or white noise. She does not use a sleep mask. She has been taking seroquel for several years since going to [employer]. She does not find this medication helpful. She also works 2 jobs and sometimes works late into the night. A/P: There are many factors outside of her control that make sleep hygiene difficult for her. I talked with her about trying ear plugs and sleep masks. She would like to continue seroquel despite this not working. Could consider trazodone at follow-up if she is interested. -continue seroquel 100 mg nightly

## 2021-12-14 NOTE — Assessment & Plan Note (Signed)
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 I talked with her about following up with GYN and she states that she is at a better spot in life now and is open to referral. A/P: Referral to GYN

## 2021-12-14 NOTE — Progress Notes (Signed)
Internal Medicine Clinic Attending  Case discussed with Dr. Masters  At the time of the visit.  We reviewed the resident's history and exam and pertinent patient test results.  I agree with the assessment, diagnosis, and plan of care documented in the resident's note.  

## 2021-12-14 NOTE — Assessment & Plan Note (Signed)
Flu vaccine received

## 2021-12-14 NOTE — Assessment & Plan Note (Addendum)
She continues to have difficulty with housing. She was living with her great-aunt who passed away last year. She is now living with her daughter and grand-daughter. She has turned in an application for housing. I have sent message to Wyatt Portela [facility] social worker about patient. Last note 12/22.

## 2021-12-14 NOTE — Telephone Encounter (Signed)
Follow-up with GI this month

## 2021-12-20 NOTE — Addendum Note (Signed)
Addended by: Edwyna Perfect on: 12/20/2021 03:04 PM   Modules accepted: Orders

## 2021-12-21 ENCOUNTER — Telehealth: Payer: Self-pay | Admitting: *Deleted

## 2021-12-21 NOTE — Progress Notes (Unsigned)
  Care Coordination  Outreach Note  12/21/2021 Name: Olivia Ewing MRN: [id number] DOB: [birth year]   Care Coordination Outreach Attempts: An unsuccessful telephone outreach was attempted today to offer the patient information about available care coordination services as a benefit of their health plan.   Follow Up Plan:  Additional outreach attempts will be made to offer the patient care coordination information and services.  Referral  Encounter Outcome:  No Answer  [place]  Direct Dial: [phone]

## 2021-12-21 NOTE — Progress Notes (Signed)
Internal Medicine Clinic Attending  Case and documentation of Dr. Masters  reviewed.  I reviewed the AWV findings.  I agree with the assessment, diagnosis, and plan of care documented in the AWV note.     

## 2021-12-25 ENCOUNTER — Ambulatory Visit: Payer: Medicare HMO | Admitting: Gastroenterology

## 2021-12-26 ENCOUNTER — Other Ambulatory Visit: Payer: Self-pay | Admitting: Physician Assistant

## 2021-12-26 ENCOUNTER — Telehealth: Payer: Self-pay | Admitting: Orthopaedic Surgery

## 2021-12-26 MED ORDER — ACETAMINOPHEN-CODEINE 300-30 MG PO TABS: 1.0000 | ORAL_TABLET | Freq: Two times a day (BID) | ORAL | 1 refills | Status: DC | PRN

## 2021-12-26 NOTE — Telephone Encounter (Signed)
Called and notified patient.

## 2021-12-26 NOTE — Telephone Encounter (Signed)
Pt called and states her left knee is killing her and she needs something for the pain. She is allergic to tramadol and was wondering if she can get something for the pain.

## 2021-12-26 NOTE — Telephone Encounter (Signed)
Sent in tylenol 3

## 2021-12-26 NOTE — Progress Notes (Signed)
  Care Coordination   Note   12/26/2021 Name: DALEAH COULSON MRN: [id number] DOB: March 19, 1950  REOLA BUCKLES is a 71 y.o. year old female who sees Masters, Joellen Jersey, DO for primary care. I reached out to [name] by phone today to offer care coordination services.  Ms. Graber was given information about Care Coordination services today including:   The Care Coordination services include support from the care team which includes your Nurse Coordinator, Clinical Social Worker, or Pharmacist.  The Care Coordination team is here to help remove barriers to the health concerns and goals most important to you. Care Coordination services are voluntary, and the patient may decline or stop services at any time by request to their care team member.   Care Coordination Consent Status: Patient agreed to services and verbal consent obtained.   Follow up plan:  Telephone appointment with care coordination team member scheduled for:  12/28/21  Encounter Outcome:  Pt. Scheduled  [place]  Direct Dial: [phone]

## 2021-12-28 ENCOUNTER — Ambulatory Visit: Payer: Self-pay | Admitting: Licensed Clinical Social Worker

## 2021-12-28 NOTE — Patient Outreach (Signed)
  Care Coordination   Initial Visit Note   12/28/2021 Name: Olivia Ewing MRN: [id number] DOB: Oct 25, 1950  Olivia Ewing is a 71 y.o. year old female who sees Masters, Joellen Jersey, DO for primary care. I spoke with  [name] by phone today.  What matters to the patients health and wellness today?  [place] Addressed               This Visit's Progress     Care Coordination Activities- Therapy / Housing (pt-stated)        Care Coordination Interventions: Discussed plans with patient for ongoing care management follow up and provided patient with direct contact information for care management team Screening for signs and symptoms of depression related to chronic disease state   Motivational Interviewing employed Depression screen reviewed  Solution-Focused Strategies employed:  Mindfulness or Relaxation training provided Active listening / Reflection utilized  Emotional Support Provided Participation in counseling encouraged  Suicidal Ideation/Homicidal Ideation assessed: Patient denied wanting to harm self or others. Discussed self-care action plan: 10 minutes of practicing mindfulness daily focusing on positive occurrences  Discussed housing. Patient is working with Housing authoriuty and will be placed on housing list one balance is paid.          SDOH assessments and interventions completed:  Yes     Care Coordination Interventions Activated:  Yes  Care Coordination Interventions:  Yes, provided   Follow up plan: Follow up call scheduled for within 30 days.    Encounter Outcome:  Pt. Scheduled

## 2021-12-28 NOTE — Patient Instructions (Signed)
Visit Information  Thank you for taking time to visit with me today. Please don't hesitate to contact me if I can be of assistance to you.   Following are the goals we discussed today:   Goals Addressed               This Visit's Progress     Care Coordination Activities- Therapy / Housing (pt-stated)        Care Coordination Interventions: Discussed plans with patient for ongoing care management follow up and provided patient with direct contact information for care management team Screening for signs and symptoms of depression related to chronic disease state   Motivational Interviewing employed Depression screen reviewed  Solution-Focused Strategies employed:  Mindfulness or Relaxation training provided Active listening / Reflection utilized  Emotional Support Provided Participation in counseling encouraged  Suicidal Ideation/Homicidal Ideation assessed: Patient denied wanting to harm self or others. Discussed self-care action plan: 10 minutes of practicing mindfulness daily focusing on positive occurrences  Discussed housing. Patient is working with Housing authoriuty and will be placed on housing list one balance is paid.            Patient verbalizes understanding of instructions and care plan provided today and agrees to view in [place]. Active MyChart status and patient understanding of how to access instructions and care plan via MyChart confirmed with patient.     The care management team will reach out to the patient again over the next 30 days.   Milus Height, Arita Miss , MSW, [place] Social Worker [facility] Care Management  [phone]

## 2021-12-31 ENCOUNTER — Other Ambulatory Visit: Payer: Self-pay

## 2021-12-31 NOTE — Telephone Encounter (Addendum)
Requesting to speak with a nurse about  gabapentin (NEURONTIN) 300 MG capsule. Want to know can she get an early refill. States she lmisplaced this medication when she went out of town. Please call pt back.  Pt would like this medication to be filled @ [facility] on [address].

## 2022-01-01 ENCOUNTER — Ambulatory Visit (INDEPENDENT_AMBULATORY_CARE_PROVIDER_SITE_OTHER): Payer: Medicare Other | Admitting: Sports Medicine

## 2022-01-01 VITALS — BP 182/90 | Ht 65.0 in | Wt 180.0 lb

## 2022-01-01 DIAGNOSIS — M1712 Unilateral primary osteoarthritis, left knee: Secondary | ICD-10-CM | POA: Diagnosis not present

## 2022-01-01 MED ORDER — METHYLPREDNISOLONE ACETATE 80 MG/ML IJ SUSP
80.0000 mg | Freq: Once | INTRAMUSCULAR | Status: AC
  Administered 2022-01-01: 80 mg via INTRAMUSCULAR

## 2022-01-01 MED ORDER — HYDROCODONE-ACETAMINOPHEN 5-325 MG PO TABS: 1.0000 | ORAL_TABLET | Freq: Every day | ORAL | 0 refills | Status: DC | PRN

## 2022-01-01 MED ORDER — GABAPENTIN 300 MG PO CAPS: ORAL_CAPSULE | ORAL | 3 refills | Status: DC

## 2022-01-01 MED ORDER — KETOROLAC TROMETHAMINE 60 MG/2ML IM SOLN
60.0000 mg | Freq: Once | INTRAMUSCULAR | Status: AC
  Administered 2022-01-01: 60 mg via INTRAMUSCULAR

## 2022-01-01 NOTE — Progress Notes (Signed)
   Subjective:    [name], female    DOB: 1950/04/06, 71 y.o.   MRN: [id number]  HPI  Olivia Ewing presents today with left knee pain.  She has a well-documented history of severe arthritis in this knee.  She has seen Dr. Erlinda Hong recently.  Her knee was aspirated and injected 1 month ago.  When pain persisted he prescribe some Tylenol 3.  She still endorses significant pain throughout the knee.  Total knee arthroplasty was discussed but the decision was made to try a round of viscosupplementation first.    Review of Systems As above    Objective:   Physical Exam  Well-developed, well-nourished.  No acute distress  Left knee: Range of motion 0 to 90 degrees.  1+ effusion.  No erythema.  Neurovascular intact distally.  Walking with a limp.      Assessment & Plan:   Persistent left knee pain secondary to DJD  Patient will need to follow-up with Dr Erlinda Hong to start viscosupplementation.  I do think she will ultimately need a total knee arthroplasty.  She is requesting a shot of Toradol today which has been beneficial to her in the past.  So we elected to inject her with 60 mg of Toradol IM as well as 80 mg of Depo-Medrol IM.  I also agreed to give her 10 additional hydrocodone pills but further pain medication will need to come from Dr.Xu.  Follow-up with me as needed.  This note was dictated using Dragon naturally speaking software and may contain errors in syntax, spelling, or content which have not been identified prior to signing this note.

## 2022-01-07 ENCOUNTER — Other Ambulatory Visit: Payer: Self-pay | Admitting: Internal Medicine

## 2022-01-07 DIAGNOSIS — R110 Nausea: Secondary | ICD-10-CM

## 2022-01-07 DIAGNOSIS — J45909 Unspecified asthma, uncomplicated: Secondary | ICD-10-CM

## 2022-01-11 ENCOUNTER — Ambulatory Visit: Payer: Self-pay | Admitting: Licensed Clinical Social Worker

## 2022-01-11 NOTE — Patient Outreach (Signed)
  Care Coordination   01/11/2022 Name: SHANTRELL PLACZEK MRN: [id number] DOB: May 20, 1950   Care Coordination Outreach Attempts:  An unsuccessful telephone outreach was attempted today to offer the patient information about available care coordination services as a benefit of their health plan.   Follow Up Plan:  Additional outreach attempts will be made to offer the patient care coordination information and services.   Encounter Outcome:  No Answer   Care Coordination Interventions:  Yes, provided    Lenor Derrick , MSW, [place] Social Worker [facility] Care Management  [phone]

## 2022-01-14 ENCOUNTER — Ambulatory Visit: Payer: Self-pay | Admitting: Licensed Clinical Social Worker

## 2022-01-14 NOTE — Patient Outreach (Signed)
  Care Coordination   01/14/2022 Name: Olivia Ewing MRN: [id number] DOB: 06/08/1950   Care Coordination Outreach Attempts:  An unsuccessful telephone outreach was attempted for a scheduled appointment today.  Follow Up Plan:  No further outreach attempts will be made at this time. We have been unable to contact the patient to offer or enroll patient in care coordination services  Encounter Outcome:  No Answer   Care Coordination Interventions:  No, not indicated    Milus Height, Arita Miss, MSW, [place]  Social Worker [facility] Care Management  [phone]

## 2022-01-15 ENCOUNTER — Ambulatory Visit: Payer: Medicare Other | Admitting: Orthopaedic Surgery

## 2022-01-28 ENCOUNTER — Encounter: Payer: Medicare Other | Admitting: Licensed Clinical Social Worker

## 2022-01-29 ENCOUNTER — Ambulatory Visit (INDEPENDENT_AMBULATORY_CARE_PROVIDER_SITE_OTHER): Payer: Medicare Other | Admitting: Gastroenterology

## 2022-01-29 ENCOUNTER — Encounter: Payer: Self-pay | Admitting: Gastroenterology

## 2022-01-29 VITALS — BP 122/74 | HR 71 | Ht 65.0 in | Wt 181.2 lb

## 2022-01-29 DIAGNOSIS — K2100 Gastro-esophageal reflux disease with esophagitis, without bleeding: Secondary | ICD-10-CM

## 2022-01-29 DIAGNOSIS — R110 Nausea: Secondary | ICD-10-CM | POA: Insufficient documentation

## 2022-01-29 DIAGNOSIS — R194 Change in bowel habit: Secondary | ICD-10-CM | POA: Diagnosis not present

## 2022-01-29 MED ORDER — NA SULFATE-K SULFATE-MG SULF 17.5-3.13-1.6 GM/177ML PO SOLN: 1.0000 | Freq: Once | ORAL | 0 refills | Status: AC

## 2022-01-29 NOTE — Progress Notes (Signed)
01/29/2022 MARESSA APOLLO [id number] 05-04-1950   HISTORY OF PRESENT ILLNESS: This is a pleasant 71 year old female who was previously a patient of Dr. Buel Ream.  She has not been seen here in about 13 years when she had her last colonoscopy that was unremarkable.  She is here today with complaints of change in bowel habits and reflux.  She had an orthopedic shoulder surgery back in January 2022 and ended up with infection, became septic.  Ended up with a PICC line and having to do IV antibiotics at home for 3 months.  She says that since then her bowel habits have not been the same.  She has a lot of loose stools and has had episodes of incontinence as well.  No rectal bleeding and no abdominal pain.  She also reports a lot of acid reflux despite taking pantoprazole 40 mg twice daily.  She says that she has on and off battled issues with nausea.  She keeps Phenergan at home to treat that prn.  She says that she gets a lot of bloating and gas after eating.  She has switched to almond milk, but uses butter, eats cheese, etc.   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   Bipolar disorder ([place])    Cervicalgia    Depression    Environmental allergies    cause SOB, uses inhaler for   Environmental and seasonal allergies    uses inhaler prn   Full dentures    GERD (gastroesophageal reflux disease)    diet controlled - no meds   Hyperlipidemia    diet controlled, no meds   Hypertension    Left knee DJD, degenerative meniscus tear 04/06/2012   Steroid injections: 09/2013 12/2013    Lumbar radiculopathy, chronic    Nontraumatic incomplete tear of right rotator cuff 07/27/2019   Primary osteoarthritis of right hip 04/27/2018   Right knee meniscal tear 03/14/2011   Right knee pain    posterior horn medial meniscal tear MRI 2013   Septic arthritis ([place])  02/25/2020   Spinal stenosis    getting epidural injections -last one 06/12/2015   Status post lumbar laminectomy 03/16/2019   Status post total hip replacement, 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   SVD (spontaneous vaginal delivery)    x 4   Past Surgical History:  Procedure Laterality Date   APPENDECTOMY     Bladder tack  10/13/2006   cystocoele   BREAST SURGERY Right    benign cyst   CARPAL TUNNEL RELEASE Left 10/25/2019   Procedure: LEFT CARPAL TUNNEL RELEASE;  Surgeon: Jessy Oto, MD;  Location: [facility];  Service: Orthopedics;  Laterality: Left;   COLONOSCOPY     Hysterectomy other     IRRIGATION AND DEBRIDEMENT SHOULDER Right 02/21/2020   Procedure: IRRIGATION AND DEBRIDEMENT RIGHT SHOULDER;  Surgeon: Leandrew Koyanagi, MD;  Location: [facility];  Service: Orthopedics;  Laterality: Right;   LUMBAR LAMINECTOMY N/A 03/16/2019   Procedure: CENTRAL LAMINECTOMIES L2-3, L3-4 AND L4-5;  Surgeon: Jessy Oto, MD;  Location: [facility];  Service: Orthopedics;  Laterality: N/A;   ROTATOR CUFF REPAIR     TOTAL HIP ARTHROPLASTY Right 04/27/2018   Procedure: RIGHT TOTAL HIP ARTHROPLASTY ANTERIOR APPROACH;  Surgeon: Leandrew Koyanagi, MD;  Location: [facility];  Service: Orthopedics;  Laterality: Right;   TUBAL LIGATION  reports that she quit smoking about 38 years ago. Her smoking use included cigarettes. She smoked an average of .1 packs per day. She has never used smokeless tobacco. She reports current alcohol use. She reports that she does not currently use drugs after having used the following drugs: Marijuana. family history includes Hypertension in her mother. Allergies  Allergen Reactions   Tramadol Other (See Comments)    Hallucinate, nightmare       Outpatient Encounter Medications as of 01/29/2022  Medication Sig   acetaminophen-codeine (TYLENOL #3) 300-30 MG tablet Take 1 tablet by mouth 2 (two) times daily as needed for moderate pain. (Patient not taking: Reported on  01/29/2022)   albuterol (VENTOLIN HFA) 108 (90 Base) MCG/ACT inhaler INHALE 1 TO 2 PUFFS INTO THE LUNGS EVERY 6 HOURS AS NEEDED FOR SHORTNESS OF BREATH   amLODipine (NORVASC) 5 MG tablet Take 1 tablet (5 mg total) by mouth daily.   famotidine (PEPCID) 20 MG tablet TAKE 1 TABLET BY MOUTH TWICE A DAY   ferrous gluconate (FERGON) 324 MG tablet Take 1 tablet (324 mg total) by mouth daily with breakfast.   fluticasone (FLONASE) 50 MCG/ACT nasal spray PLACE 1-2 SPRAYS INTO BOTH NOSTRILS DAILY.   gabapentin (NEURONTIN) 300 MG capsule TAKE 1 CAPSULE BY MOUTH THREE TIMES A DAY   lisinopril-hydrochlorothiazide (ZESTORETIC) 20-12.5 MG tablet Take 1 tablet by mouth daily.   Multiple Vitamin (MULTIVITAMIN WITH MINERALS) TABS tablet Take 1 tablet by mouth daily.   pantoprazole (PROTONIX) 40 MG tablet TAKE 1 TABLET BY MOUTH TWICE A DAY   promethazine (PHENERGAN) 25 MG tablet TAKE 1 TABLET BY MOUTH EVERY 8 HOURS AS NEEDED   QUEtiapine (SEROQUEL) 100 MG tablet TAKE 1 TABLET BY MOUTH EVERY NIGHT AT BEDTIME   rosuvastatin (CRESTOR) 20 MG tablet Take 1 tablet (20 mg total) by mouth daily.   sertraline (ZOLOFT) 100 MG tablet Take 2 tablets (200 mg total) by mouth daily.   sodium chloride (OCEAN) 0.65 % SOLN nasal spray Place 1 spray into both nostrils 2 (two) times daily as needed.   fluticasone (FLOVENT HFA) 44 MCG/ACT inhaler Inhale 1 puff into the lungs 2 (two) times daily.   HYDROcodone-acetaminophen (NORCO) 5-325 MG tablet Take 1 tablet by mouth daily as needed. (Patient not taking: Reported on 01/29/2022)   loratadine (CLARITIN) 10 MG tablet Take 1 tablet (10 mg total) by mouth daily.   No facility-administered encounter medications on file as of 01/29/2022.    REVIEW OF SYSTEMS  : All other systems reviewed and negative except where noted in the History of Present Illness.   PHYSICAL EXAM: BP 122/74   Pulse 71   Ht '5\' 5"'$  (1.651 m)   Wt 181 lb 4 oz (82.2 kg)   BMI 30.16 kg/m  General: Well developed  female in no acute distress Head: Normocephalic and atraumatic Eyes:  Sclerae anicteric, conjunctiva pink. Ears: Normal auditory acuity Lungs: Clear throughout to auscultation; no W/R/R. Heart: Regular rate and rhythm; no M/R/G. Abdomen: Soft, non-distended.  BS present.  Non-tender. Rectal:  Will be done at the time of colonoscopy. Musculoskeletal: Symmetrical with no gross deformities  Skin: No lesions on visible extremities Extremities: No edema  Neurological: Alert oriented x 4, grossly non-focal Psychological:  Alert and cooperative. Normal mood and affect  ASSESSMENT AND PLAN: *Change in bowel habits: Has had more looser stools with some incontinence, more so over the past several months since having to take IV antibiotics through PICC line for several months due to  sepsis from an orthopedic surgery.  There is somewhat of a chronic component to her symptoms, but just worse as of late.  Last colonoscopy was 13 years ago.  Will plan for colonoscopy with Dr. Havery Moros.  Will have her try Florastor probiotic daily for the next month or 2 just to replenish her gut flora since she did require extensive antibiotic use.  We also discussed a lactose-free diet and she was given literature on this.  That may be a component of her symptoms as well. *GERD: Symptoms despite pantoprazole 40 mg twice daily.  Has never had an EGD in the past.  Also reports issues with recurrent chronic nausea.  Will plan for EGD with Dr. Havery Moros as well.  **The risks, benefits, and alternatives to EGD and colonoscopy were discussed with the patient and she consents to proceed.  CC:  Masters, Joellen Jersey, DO

## 2022-01-29 NOTE — Progress Notes (Signed)
Agree with assessment and plan as outlined.  

## 2022-01-29 NOTE — Patient Instructions (Signed)
Start Florastor daily for 1-2 months.   Start Lactose free diet.   You have been scheduled for an endoscopy and colonoscopy. Please follow the written instructions given to you at your visit today. Please pick up your prep supplies at the pharmacy within the next 1-3 days. If you use inhalers (even only as needed), please bring them with you on the day of your procedure.  _______________________________________________________  If you are age 70 or older, your body mass index should be between 23-30. Your Body mass index is 30.16 kg/m. If this is out of the aforementioned range listed, please consider follow up with your Primary Care Provider.  If you are age 44 or younger, your body mass index should be between 19-25. Your Body mass index is 30.16 kg/m. If this is out of the aformentioned range listed, please consider follow up with your Primary Care Provider.   ________________________________________________________  The [facility] providers would like to encourage you to use [facility] to communicate with providers for non-urgent requests or questions.  Due to long hold times on the telephone, sending your provider a message by [facility] may be a faster and more efficient way to get a response.  Please allow 48 business hours for a response.  Please remember that this is for non-urgent requests.  _______________________________________________________

## 2022-01-31 ENCOUNTER — Telehealth: Payer: Self-pay

## 2022-01-31 ENCOUNTER — Other Ambulatory Visit: Payer: Self-pay

## 2022-01-31 ENCOUNTER — Ambulatory Visit: Payer: Medicare Other | Admitting: Orthopaedic Surgery

## 2022-01-31 DIAGNOSIS — M1712 Unilateral primary osteoarthritis, left knee: Secondary | ICD-10-CM

## 2022-01-31 NOTE — Telephone Encounter (Signed)
-----   Message from Lendon Collar, RT sent at 01/30/2022  4:15 PM EST ----- Any word on her approval for Durolane? She has an appt with Dr.Xu tomorrow morning.

## 2022-01-31 NOTE — Telephone Encounter (Signed)
She is approved to have gel injection.  I did see that she R/S her appointment to 02/14/2022, so I may have to resubmit.

## 2022-02-08 ENCOUNTER — Other Ambulatory Visit: Payer: Self-pay | Admitting: Internal Medicine

## 2022-02-08 DIAGNOSIS — K219 Gastro-esophageal reflux disease without esophagitis: Secondary | ICD-10-CM

## 2022-02-14 ENCOUNTER — Ambulatory Visit: Payer: Medicare Other | Admitting: Orthopaedic Surgery

## 2022-02-18 ENCOUNTER — Other Ambulatory Visit: Payer: Self-pay | Admitting: Internal Medicine

## 2022-02-18 DIAGNOSIS — R110 Nausea: Secondary | ICD-10-CM

## 2022-02-19 ENCOUNTER — Ambulatory Visit: Payer: 59 | Admitting: Family Medicine

## 2022-02-25 ENCOUNTER — Ambulatory Visit (HOSPITAL_COMMUNITY): Payer: 59 | Admitting: Student in an Organized Health Care Education/Training Program

## 2022-02-26 ENCOUNTER — Ambulatory Visit (INDEPENDENT_AMBULATORY_CARE_PROVIDER_SITE_OTHER): Payer: 59 | Admitting: Orthopaedic Surgery

## 2022-02-26 DIAGNOSIS — M1712 Unilateral primary osteoarthritis, left knee: Secondary | ICD-10-CM | POA: Diagnosis not present

## 2022-02-26 MED ORDER — SODIUM HYALURONATE 60 MG/3ML IX PRSY
60.0000 mg | PREFILLED_SYRINGE | INTRA_ARTICULAR | Status: AC | PRN
  Administered 2022-02-26: 60 mg via INTRA_ARTICULAR

## 2022-02-26 NOTE — Progress Notes (Signed)
Office Visit Note   Patient: Olivia Ewing           Date of Birth: December 06, 1950           MRN: [id number] Visit Date: 02/26/2022              Requested by: Olivia Ewing, Olivia [place], DO [address] [place],  Olivia Ewing [id number] PCP: Olivia Fuchs, DO   Assessment & Plan: Visit Diagnoses:  1. Primary osteoarthritis of left knee     Plan: Left knee injected with durolane today.  Tolerated well.  She will eventually elect to undergo TKA when is ready.  8 cc aspirated today.    Follow-Up Instructions: No follow-ups on file.   Orders:  No orders of the defined types were placed in this encounter.  No orders of the defined types were placed in this encounter.     Procedures: Large Joint Inj: L knee on 02/26/2022 9:11 AM Indications: pain Details: 22 G needle  Arthrogram: No  Medications: 60 mg Sodium Hyaluronate 60 MG/3ML Outcome: tolerated well, no immediate complications Patient was prepped and draped in the usual sterile fashion.       Clinical Data: No additional findings.   Subjective: Chief Complaint  Patient presents with   Left Knee - Pain    HPI Olivia Ewing is here for Durolane injection for the left knee today.    Review of Systems   Objective: Vital Signs: There were no vitals taken for this visit.  Physical Exam  Ortho Exam Exam stable Specialty Comments:  No specialty comments available.  Imaging: No results found.   PMFS History: Patient Active Problem List   Diagnosis Date Noted   Nausea 01/29/2022   Change in bowel habits 01/29/2022   Primary osteoarthritis of left knee 11/29/2021   Trigger finger, right ring finger 04/10/2021   Trigger finger, left ring finger 02/26/2021   Asthma 12/13/2020   GERD (gastroesophageal reflux disease) 12/13/2020   Housing instability 12/13/2020   Atypical squamous cells cannot exclude high grade squamous intraepithelial lesion on cytologic smear of cervix (ASC-H) 09/01/2020   Vaginal itching  09/01/2020   De Quervain's disease (tenosynovitis) 08/22/2020   Insomnia 06/08/2020   Medication monitoring encounter 02/25/2020   S/P arthroscopy of right shoulder 01/05/2020   Carpal tunnel syndrome, left upper limb 10/25/2019    Class: Chronic   Carpal tunnel syndrome, right upper limb 10/25/2019    Class: Chronic   Tendinopathy of right biceps tendon 07/27/2019   Breast cancer screening by mammogram 06/07/2019   Seasonal allergic rhinitis due to pollen 06/07/2019   Colon cancer screening 06/07/2019   Spinal stenosis, lumbar region, with neurogenic claudication 03/16/2019    Class: Chronic   Acute stress reaction 09/25/2018   Vitamin D insufficiency 11/25/2013   Healthcare maintenance 11/25/2013   Depression 09/23/2011   Functional incontinence 10/18/2010   Pain in right leg 10/18/2010   Iron deficiency anemia 05/12/2008   Hyperlipemia 06/18/2007   Essential hypertension 05/20/2007   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   Bipolar disorder ([place])    Cervicalgia    Depression    Environmental allergies    cause SOB, uses inhaler for   Environmental and seasonal allergies    uses inhaler prn   Full dentures    GERD (gastroesophageal reflux disease)    diet controlled -  no meds   Hyperlipidemia    diet controlled, no meds   Hypertension    Left knee DJD, degenerative meniscus tear 04/06/2012   Steroid injections: 09/2013 12/2013    Lumbar radiculopathy, chronic    Nontraumatic incomplete tear of right rotator cuff 07/27/2019   Primary osteoarthritis of right hip 04/27/2018   Right knee meniscal tear 03/14/2011   Right knee pain    posterior horn medial meniscal tear MRI 2013   Septic arthritis ([place]) 02/25/2020   Spinal stenosis    getting epidural injections -last one 06/12/2015   Status post lumbar laminectomy 03/16/2019   Status post  total hip replacement, 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   SVD (spontaneous vaginal delivery)    x 4    Family History  Problem Relation Age of Onset   Hypertension Mother     Past Surgical History:  Procedure Laterality Date   APPENDECTOMY     Bladder tack  10/13/2006   cystocoele   BREAST SURGERY Right    benign cyst   CARPAL TUNNEL RELEASE Left 10/25/2019   Procedure: LEFT CARPAL TUNNEL RELEASE;  Surgeon: Jessy Oto, MD;  Location: [facility];  Service: Orthopedics;  Laterality: Left;   COLONOSCOPY     Hysterectomy other     IRRIGATION AND DEBRIDEMENT SHOULDER Right 02/21/2020   Procedure: IRRIGATION AND DEBRIDEMENT RIGHT SHOULDER;  Surgeon: Leandrew Koyanagi, MD;  Location: [facility];  Service: Orthopedics;  Laterality: Right;   LUMBAR LAMINECTOMY N/A 03/16/2019   Procedure: CENTRAL LAMINECTOMIES L2-3, L3-4 AND L4-5;  Surgeon: Jessy Oto, MD;  Location: [facility];  Service: Orthopedics;  Laterality: N/A;   ROTATOR CUFF REPAIR     TOTAL HIP ARTHROPLASTY Right 04/27/2018   Procedure: RIGHT TOTAL HIP ARTHROPLASTY ANTERIOR APPROACH;  Surgeon: Leandrew Koyanagi, MD;  Location: [facility];  Service: Orthopedics;  Laterality: Right;   TUBAL LIGATION     Social History   Occupational History   Not on file  Tobacco Use   Smoking status: Former    Packs/day: 0.10    Types: Cigarettes    Quit date: 11/26/1983    Years since quitting: 38.2   Smokeless tobacco: Never  Vaping Use   Vaping Use: Never used  Substance and Sexual Activity   Alcohol use: Yes    Alcohol/week: 0.0 standard drinks of alcohol    Comment: rarely   Drug use: Not Currently    Types: Marijuana    Comment: last used 2019   Sexual activity: Not Currently    Birth control/protection: Surgical

## 2022-03-05 ENCOUNTER — Other Ambulatory Visit: Payer: Self-pay | Admitting: Internal Medicine

## 2022-03-05 DIAGNOSIS — J301 Allergic rhinitis due to pollen: Secondary | ICD-10-CM

## 2022-03-05 NOTE — Telephone Encounter (Signed)
Next appt scheduled 03/29/22 with PCP.

## 2022-03-15 ENCOUNTER — Encounter: Payer: Self-pay | Admitting: Gastroenterology

## 2022-03-17 ENCOUNTER — Encounter: Payer: Self-pay | Admitting: Certified Registered Nurse Anesthetist

## 2022-03-18 ENCOUNTER — Ambulatory Visit: Payer: 59 | Admitting: Obstetrics and Gynecology

## 2022-03-22 ENCOUNTER — Ambulatory Visit (AMBULATORY_SURGERY_CENTER): Payer: 59 | Admitting: Gastroenterology

## 2022-03-22 ENCOUNTER — Encounter: Payer: Self-pay | Admitting: Gastroenterology

## 2022-03-22 VITALS — BP 157/96 | HR 69 | Temp 97.5°F | Resp 15 | Ht 65.0 in | Wt 181.0 lb

## 2022-03-22 DIAGNOSIS — D120 Benign neoplasm of cecum: Secondary | ICD-10-CM

## 2022-03-22 DIAGNOSIS — R110 Nausea: Secondary | ICD-10-CM

## 2022-03-22 DIAGNOSIS — K2101 Gastro-esophageal reflux disease with esophagitis, with bleeding: Secondary | ICD-10-CM

## 2022-03-22 DIAGNOSIS — R194 Change in bowel habit: Secondary | ICD-10-CM

## 2022-03-22 DIAGNOSIS — D122 Benign neoplasm of ascending colon: Secondary | ICD-10-CM | POA: Diagnosis not present

## 2022-03-22 DIAGNOSIS — Z1211 Encounter for screening for malignant neoplasm of colon: Secondary | ICD-10-CM | POA: Diagnosis not present

## 2022-03-22 DIAGNOSIS — D132 Benign neoplasm of duodenum: Secondary | ICD-10-CM | POA: Diagnosis not present

## 2022-03-22 DIAGNOSIS — D123 Benign neoplasm of transverse colon: Secondary | ICD-10-CM | POA: Diagnosis not present

## 2022-03-22 DIAGNOSIS — K219 Gastro-esophageal reflux disease without esophagitis: Secondary | ICD-10-CM

## 2022-03-22 DIAGNOSIS — K319 Disease of stomach and duodenum, unspecified: Secondary | ICD-10-CM

## 2022-03-22 MED ORDER — SODIUM CHLORIDE 0.9 % IV SOLN
4.0000 mg | Freq: Once | INTRAVENOUS | Status: AC
  Administered 2022-03-22: 4 mg via INTRAVENOUS

## 2022-03-22 MED ORDER — SUCRALFATE 1 GM/10ML PO SUSP: 1.0000 g | Freq: Four times a day (QID) | ORAL | 1 refills | Status: DC | PRN

## 2022-03-22 MED ORDER — OMEPRAZOLE 40 MG PO CPDR: 40.0000 mg | DELAYED_RELEASE_CAPSULE | Freq: Two times a day (BID) | ORAL | 3 refills | Status: DC

## 2022-03-22 MED ORDER — SODIUM CHLORIDE 0.9 % IV SOLN: 500.0000 mL | Freq: Once | INTRAVENOUS | Status: DC

## 2022-03-22 MED ORDER — METOCLOPRAMIDE HCL 5 MG PO TABS: 5.0000 mg | ORAL_TABLET | Freq: Three times a day (TID) | ORAL | 1 refills | Status: DC | PRN

## 2022-03-22 NOTE — Patient Instructions (Signed)
Stop taking Protonix,Start Omeprazole 40 mg twice a day  Add Reglan 5 mg three times a day as needed   Add Carafate 10 cc every 6 hours as needed - these 3 medications sent to your pharmacy     Await pathology results on colon polyps removed & on colon biopsies done   Handouts on polyps,diverticulosis ,& hemorrhoids given to you today   Await biopsy results of duodenal and gastric biopsies     YOU HAD AN ENDOSCOPIC PROCEDURE TODAY AT [facility]:   Refer to the procedure report that was given to you for any specific questions about what was found during the examination.  If the procedure report does not answer your questions, please call your gastroenterologist to clarify.  If you requested that your care partner not be given the details of your procedure findings, then the procedure report has been included in a sealed envelope for you to review at your convenience later.  YOU SHOULD EXPECT: Some feelings of bloating in the abdomen. Passage of more gas than usual.  Walking can help get rid of the air that was put into your GI tract during the procedure and reduce the bloating. If you had a lower endoscopy (such as a colonoscopy or flexible sigmoidoscopy) you may notice spotting of blood in your stool or on the toilet paper. If you underwent a bowel prep for your procedure, you may not have a normal bowel movement for a few days.  Please Note:  You might notice some irritation and congestion in your nose or some drainage.  This is from the oxygen used during your procedure.  There is no need for concern and it should clear up in a day or so.  SYMPTOMS TO REPORT IMMEDIATELY:  Following lower endoscopy (colonoscopy or flexible sigmoidoscopy):  Excessive amounts of blood in the stool  Significant tenderness or worsening of abdominal pains  Swelling of the abdomen that is new, acute  Fever of 100F or higher  Following upper endoscopy (EGD)  Vomiting of blood or  coffee ground material  New chest pain or pain under the shoulder blades  Painful or persistently difficult swallowing  New shortness of breath  Fever of 100F or higher  Black, tarry-looking stools  For urgent or emergent issues, a gastroenterologist can be reached at any hour by calling [phone]. Do not use MyChart messaging for urgent concerns.    DIET:  We do recommend a small meal at first, but then you may proceed to your regular diet.  Drink plenty of fluids but you should avoid alcoholic beverages for 24 hours.  ACTIVITY:  You should plan to take it easy for the rest of today and you should NOT DRIVE or use heavy machinery until tomorrow (because of the sedation medicines used during the test).    FOLLOW UP: Our staff will call the number listed on your records the next business day following your procedure.  We will call around 7:15- 8:00 am to check on you and address any questions or concerns that you may have regarding the information given to you following your procedure. If we do not reach you, we will leave a message.     If any biopsies were taken you will be contacted by phone or by letter within the next 1-3 weeks.  Please call us at [phone] if you have not heard about the biopsies in 3 weeks.    SIGNATURES/CONFIDENTIALITY: You and/or your care partner have  signed paperwork which will be entered into your electronic medical record.  These signatures attest to the fact that that the information above on your After Visit Summary has been reviewed and is understood.  Full responsibility of the confidentiality of this discharge information lies with you and/or your care-partner.

## 2022-03-22 NOTE — Progress Notes (Signed)
[facility] History and Physical   Primary Care Physician:  Masters, Joellen Jersey, DO   Reason for Procedure:   GERD despite protonix twice daily, nausea, change in bowel habits, screening for colon cancer  Plan:    EGD and colonoscopy     HPI: Olivia Ewing is a 72 y.o. female  here for EGD and colonoscopy to evaluate issues as outlined above.  Last colonoscopy 2011 normal. Has had some looser stools with occasional incontinence since antibiotic use when septic from prior orthopedic surgery. Ongoing nausea / GERD despite protonix. No prior EGD.   Otherwise feels well without any cardiopulmonary symptoms.   I have discussed risks / benefits of anesthesia and endoscopic procedure with [name] and they wish to proceed with the exams as outlined today.    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   Steroid injections: 09/2013 12/2013    Lumbar radiculopathy, chronic    Nontraumatic incomplete tear of right rotator cuff 07/27/2019   Primary osteoarthritis of right hip 04/27/2018   Right knee meniscal tear 03/14/2011   Right knee pain    posterior horn medial meniscal tear MRI 2013   Septic arthritis ([place]) 02/25/2020   Spinal stenosis    getting epidural injections -last one 06/12/2015   Status post lumbar laminectomy 03/16/2019   Status post total hip replacement, 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   SVD (spontaneous vaginal delivery)    x 4    Past Surgical History:   Procedure Laterality Date   APPENDECTOMY     Bladder tack  10/13/2006   cystocoele   BREAST SURGERY Right    benign cyst   CARPAL TUNNEL RELEASE Left 10/25/2019   Procedure: LEFT CARPAL TUNNEL RELEASE;  Surgeon: Jessy Oto, MD;  Location: [facility];  Service: Orthopedics;  Laterality: Left;   COLONOSCOPY     Hysterectomy other     IRRIGATION AND DEBRIDEMENT SHOULDER Right 02/21/2020   Procedure: IRRIGATION AND DEBRIDEMENT RIGHT SHOULDER;  Surgeon: Leandrew Koyanagi, MD;  Location: [facility];  Service: Orthopedics;  Laterality: Right;   LUMBAR LAMINECTOMY N/A 03/16/2019   Procedure: CENTRAL LAMINECTOMIES L2-3, L3-4 AND L4-5;  Surgeon: Jessy Oto, MD;  Location: [facility];  Service: Orthopedics;  Laterality: N/A;   ROTATOR CUFF REPAIR     TOTAL HIP ARTHROPLASTY Right 04/27/2018   Procedure: RIGHT TOTAL HIP ARTHROPLASTY ANTERIOR APPROACH;  Surgeon: Leandrew Koyanagi, MD;  Location: [facility];  Service: Orthopedics;  Laterality: Right;   TUBAL LIGATION      Prior to Admission medications   Medication Sig Start Date End Date Taking? Authorizing Provider  acetaminophen-codeine (TYLENOL #3) 300-30 MG tablet Take 1 tablet by mouth 2 (two) times daily as needed for moderate pain. Patient not taking: Reported on 01/29/2022 12/26/21   Aundra Dubin, PA-C  albuterol (VENTOLIN HFA) 108 (90 Base) MCG/ACT inhaler INHALE 1  TO 2 PUFFS INTO THE LUNGS EVERY 6 HOURS AS NEEDED FOR SHORTNESS OF BREATH 01/07/22  Yes Masters, Katie, DO  amLODipine (NORVASC) 5 MG tablet Take 1 tablet (5 mg total) by mouth daily. 05/08/21  Yes Masters, Scientist, research (physical sciences), DO  famotidine (PEPCID) 20 MG tablet TAKE 1 TABLET BY MOUTH TWICE A DAY 02/13/22  Yes Masters, Katie, DO  ferrous gluconate (FERGON) 324 MG tablet Take 1 tablet (324 mg total) by mouth daily with breakfast. 04/04/21  Yes Masters, Katie, DO  fluticasone (FLONASE) 50 MCG/ACT nasal spray PLACE 1 OR 2 SPRAYS IN EACH NOSTRIL DAILY 03/05/22  Yes Masters,  Katie, DO  gabapentin (NEURONTIN) 300 MG capsule TAKE 1 CAPSULE BY MOUTH THREE TIMES A DAY 01/01/22  Yes Masters, Katie, DO  lisinopril-hydrochlorothiazide (ZESTORETIC) 20-12.5 MG tablet Take 1 tablet by mouth daily. 05/08/21  Yes Masters, Scientist, research (physical sciences), DO  Multiple Vitamin (MULTIVITAMIN WITH MINERALS) TABS tablet Take 1 tablet by mouth daily.   Yes [provider]  pantoprazole (PROTONIX) 40 MG tablet TAKE 1 TABLET BY MOUTH TWICE A DAY 12/17/21  Yes Masters, Katie, DO  promethazine (PHENERGAN) 25 MG tablet TAKE 1 TABLET BY MOUTH EVERY 8 HOURS AS NEEDED 02/19/22  Yes Masters, Katie, DO  QUEtiapine (SEROQUEL) 100 MG tablet TAKE 1 TABLET BY MOUTH EVERY NIGHT AT BEDTIME 12/13/21  Yes Masters, Katie, DO  rosuvastatin (CRESTOR) 20 MG tablet Take 1 tablet (20 mg total) by mouth daily. 05/09/21 05/04/22 Yes Masters, Katie, DO  sertraline (ZOLOFT) 100 MG tablet Take 2 tablets (200 mg total) by mouth daily. 12/17/21  Yes Masters, Katie, DO  sodium chloride (OCEAN) 0.65 % SOLN nasal spray Place 1 spray into both nostrils 2 (two) times daily as needed. 05/10/20  Yes Seawell, Jaimie A, DO  fluticasone (FLOVENT HFA) 44 MCG/ACT inhaler Inhale 1 puff into the lungs 2 (two) times daily. 12/13/20 12/13/21  Masters, Katie, DO  HYDROcodone-acetaminophen (NORCO) 5-325 MG tablet Take 1 tablet by mouth daily as needed. Patient not taking: Reported on 01/29/2022 01/01/22   Thurman Coyer, DO  loratadine (CLARITIN) 10 MG tablet Take 1 tablet (10 mg total) by mouth daily. 07/24/20 08/23/20  [name], MD    Current Outpatient Medications  Medication Sig Dispense Refill   acetaminophen-codeine (TYLENOL #3) 300-30 MG tablet Take 1 tablet by mouth 2 (two) times daily as needed for moderate pain. (Patient not taking: Reported on 01/29/2022) 20 tablet 1   albuterol (VENTOLIN HFA) 108 (90 Base) MCG/ACT inhaler INHALE 1 TO 2 PUFFS INTO THE LUNGS EVERY 6 HOURS AS NEEDED FOR SHORTNESS OF BREATH 8.5 g 3   amLODipine (NORVASC) 5 MG  tablet Take 1 tablet (5 mg total) by mouth daily. 90 tablet 2   famotidine (PEPCID) 20 MG tablet TAKE 1 TABLET BY MOUTH TWICE A DAY 60 tablet 2   ferrous gluconate (FERGON) 324 MG tablet Take 1 tablet (324 mg total) by mouth daily with breakfast. 60 tablet 1   fluticasone (FLONASE) 50 MCG/ACT nasal spray PLACE 1 OR 2 SPRAYS IN EACH NOSTRIL DAILY 16 g 3   gabapentin (NEURONTIN) 300 MG capsule TAKE 1 CAPSULE BY MOUTH THREE TIMES A DAY 270 capsule 3   lisinopril-hydrochlorothiazide (ZESTORETIC) 20-12.5 MG tablet Take 1 tablet by mouth daily. 90 tablet 2   Multiple Vitamin (MULTIVITAMIN WITH MINERALS) TABS tablet Take 1 tablet by mouth daily.     pantoprazole (PROTONIX) 40 MG tablet TAKE 1 TABLET BY MOUTH TWICE A DAY 60 tablet 2   promethazine (PHENERGAN) 25 MG  tablet TAKE 1 TABLET BY MOUTH EVERY 8 HOURS AS NEEDED 30 tablet 0   QUEtiapine (SEROQUEL) 100 MG tablet TAKE 1 TABLET BY MOUTH EVERY NIGHT AT BEDTIME 30 tablet 3   rosuvastatin (CRESTOR) 20 MG tablet Take 1 tablet (20 mg total) by mouth daily. 90 tablet 1   sertraline (ZOLOFT) 100 MG tablet Take 2 tablets (200 mg total) by mouth daily. 180 tablet 3   sodium chloride (OCEAN) 0.65 % SOLN nasal spray Place 1 spray into both nostrils 2 (two) times daily as needed. 30 mL 0   fluticasone (FLOVENT HFA) 44 MCG/ACT inhaler Inhale 1 puff into the lungs 2 (two) times daily. 1 each 2   HYDROcodone-acetaminophen (NORCO) 5-325 MG tablet Take 1 tablet by mouth daily as needed. (Patient not taking: Reported on 01/29/2022) 10 tablet 0   loratadine (CLARITIN) 10 MG tablet Take 1 tablet (10 mg total) by mouth daily. 30 tablet 0   Current Facility-Administered Medications  Medication Dose Route Frequency Provider Last Rate Last Admin   0.9 %  sodium chloride infusion  500 mL Intravenous Once Taelyn Nemes, Carlota Raspberry, MD        Allergies as of 03/22/2022 - Review Complete 03/22/2022  Allergen Reaction Noted   Tramadol Other (See Comments) 08/17/2019    Family  History  Problem Relation Age of Onset   Hypertension Mother    Colon cancer Neg Hx    Esophageal cancer Neg Hx    Rectal cancer Neg Hx    Stomach cancer Neg Hx     Social History   Socioeconomic History   Marital status: Divorced    Spouse name: Not on file   Number of children: 4   Years of education: Not on file   Highest education level: Not on file  Occupational History   Not on file  Tobacco Use   Smoking status: Former    Packs/day: 0.10    Types: Cigarettes    Quit date: 11/26/1983    Years since quitting: 38.3   Smokeless tobacco: Never  Vaping Use   Vaping Use: Never used  Substance and Sexual Activity   Alcohol use: Yes    Alcohol/week: 0.0 standard drinks of alcohol    Comment: rarely   Drug use: Not Currently    Types: Marijuana    Comment: last used 2019   Sexual activity: Not Currently    Birth control/protection: Surgical  Other Topics Concern   Not on file  Social History Narrative   Single. 9 siblings. 1 brother with HIV, 1 sister with hypertension. 4 children, 1 daughter with HTN and depression, 1 son with HTN.   [phone]- shelter's number.    Former smoker, no alcohol or drug use.   Social Determinants of Health   Financial Resource Strain: Low Risk  (12/12/2021)   Overall Financial Resource Strain (CARDIA)    Difficulty of Paying Living Expenses: Not hard at all  Food Insecurity: No Food Insecurity (12/12/2021)   Hunger Vital Sign    Worried About Running Out of Food in the Last Year: Never true    Ran Out of Food in the Last Year: Never true  Transportation Needs: No Transportation Needs (12/27/2020)   PRAPARE - Hydrologist (Medical): No    Lack of Transportation (Non-Medical): No  Physical Activity: Sufficiently Active (12/12/2021)   Exercise Vital Sign    Days of Exercise per Week: 7 days    Minutes of Exercise per Session: 30 min  Stress: Stress Concern Present (12/12/2021)   [place]    Feeling of Stress : Very much  Social Connections: Moderately Integrated (12/12/2021)   Social Connection and Isolation Panel [NHANES]    Frequency of Communication with Friends and Family: More than three times a week    Frequency of Social Gatherings with Friends and Family: More than three times a week    Attends Religious Services: 1 to 4 times per year    Active Member of [employer] or Organizations: No    Attends Archivist Meetings: 1 to 4 times per year    Marital Status: Never married  Intimate Partner Violence: Not At Risk (12/12/2021)   Humiliation, Afraid, Rape, and Kick questionnaire    Fear of Current or Ex-Partner: No    Emotionally Abused: No    Physically Abused: No    Sexually Abused: No    Review of Systems: All other review of systems negative except as mentioned in the HPI.  Physical Exam: Vital signs BP 131/74   Pulse 67   Temp (!) 97.5 F (36.4 C)   Ht 5' 5"$  (1.651 m)   Wt 181 lb (82.1 kg)   SpO2 100%   BMI 30.12 kg/m   General:   Alert,  Well-developed, pleasant and cooperative in NAD Lungs:  Clear throughout to auscultation.   Heart:  Regular rate and rhythm Abdomen:  Soft, nontender and nondistended.   Neuro/Psych:  Alert and cooperative. Normal mood and affect. A and O x 3  Jolly Mango, MD [facility]

## 2022-03-22 NOTE — Progress Notes (Signed)
Cell phone off per pt

## 2022-03-22 NOTE — Progress Notes (Signed)
Called to room to assist during endoscopic procedure.  Patient ID and intended procedure confirmed with present staff. Received instructions for my participation in the procedure from the performing physician.  

## 2022-03-22 NOTE — Op Note (Signed)
[facility] Patient Name: Olivia Ewing Procedure Date: 03/22/2022 4:19 PM MRN: [id number] Endoscopist: Remo Lipps P. Havery Moros , MD, [id number] Age: 72 Referring MD:  Date of Birth: May 02, 1950 Gender: Female Account #: [id number] Procedure:                Upper GI endoscopy Indications:              persistent gastro-esophageal reflux disease despite                            protonix 82m twice daily, Nausea - treated with                            phenergan Medicines:                Monitored Anesthesia Care Procedure:                Pre-Anesthesia Assessment:                           - Prior to the procedure, a History and Physical                            was performed, and patient medications and                            allergies were reviewed. The patient's tolerance of                            previous anesthesia was also reviewed. The risks                            and benefits of the procedure and the sedation                            options and risks were discussed with the patient.                            All questions were answered, and informed consent                            was obtained. Prior Anticoagulants: The patient has                            taken no anticoagulant or antiplatelet agents. ASA                            Grade Assessment: II - A patient with mild systemic                            disease. After reviewing the risks and benefits,                            the patient was deemed in satisfactory condition to  undergo the procedure.                           After obtaining informed consent, the endoscope was                            passed under direct vision. Throughout the                            procedure, the patient's blood pressure, pulse, and                            oxygen saturations were monitored continuously. The                            GIF [id number] was introduced  through the                            mouth, and advanced to the second part of duodenum.                            The upper GI endoscopy was accomplished without                            difficulty. The patient tolerated the procedure                            well. Scope In: Scope Out: Findings:                 Esophagogastric landmarks were identified: the                            Z-line was found at 35 cm, the gastroesophageal                            junction was found at 35 cm and the upper extent of                            the gastric folds was found at 36 cm from the                            incisors.                           A 1 cm hiatal hernia was present.                           LA Grade B esophagitis was found at the GEJ.                           The exam of the esophagus was otherwise normal.                           A small diverticulum vs. benign pancreatic  rest was                            found in the gastric antrum.                           The exam of the stomach was otherwise normal.                           Biopsies were taken with a cold forceps in the                            gastric body, at the incisura and in the gastric                            antrum for Helicobacter pylori testing.                           Diffusely abnormal mucosal changes were found in                            the suspected third portion of the duodenum. It was                            concering for flat polypoid tissue in a                            circumferential pattern with areas of nodularity                            that were concerning for dysplastic or malignant                            change. Length of the segment was perhaps 5cm or so                            and could be traversed, lumen was widely patent. I                            do not think this involved the ampulla, these                            changes appeared distal to the  ampulla. Several                            biopsies were taken with a cold forceps for                            histology. Area proximal to the area was tattooed                            with an injection of Spot (carbon black).  The exam of the duodenum was otherwise normal. Complications:            No immediate complications. Estimated blood loss:                            Minimal. Estimated Blood Loss:     Estimated blood loss was minimal. Impression:               - Esophagogastric landmarks identified.                           - 1 cm hiatal hernia.                           - LA Grade B reflux esophagitis.                           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                           - Normal stomach otherwise - biopsies taken to rule                            out H pylori                           - Mucosal changes in the duodenum as outlined -                            concering for dysplastic or even malignant change.                            Biopsied. Tattooed.                           Duodenal findings are concerning and perhaps the                            cause of the patient's symptoms, biopsies sent rush                            to path. Recommendation:           - Patient has a contact number available for                            emergencies. The signs and symptoms of potential                            delayed complications were discussed with the                            patient. Return to normal activities tomorrow.  Written discharge instructions were provided to the                            patient.                           - Resume previous diet.                           - Continue present medications.                           - Change protonix to omeprazole 3m twice daily                           - Add  liquid carafate 10cc every 6 hours as needed                            (this will make stools dark)                           - Add Reglan 587mthree times daily as needed, will                            counsel patient on risks                           - Await pathology results, should be back on                            Tuesday (today is Friday PM)                           - Anticipate CT abdomen / pelvis will be needed                            pending pathology Yeison Sippel P. Jaycey Gens, MD 03/22/2022 5:19:36 PM This report has been signed electronically.

## 2022-03-22 NOTE — Op Note (Addendum)
[facility] Patient Name: Olivia Ewing Procedure Date: 03/22/2022 4:19 PM MRN: [id number] Endoscopist: Remo Lipps P. Havery Moros , MD, [id number] Age: 72 Referring MD:  Date of Birth: 12-11-1950 Gender: Female Account #: [id number] Procedure:                Colonoscopy Indications:              Screening for colorectal malignant neoplasm. Last                            exam 2011. Patient incidentally had had some looser                            stools since hospitalization / antibiotics for                            sepsis treatment but not significant Medicines:                Monitored Anesthesia Care Procedure:                Pre-Anesthesia Assessment:                           - Prior to the procedure, a History and Physical                            was performed, and patient medications and                            allergies were reviewed. The patient's tolerance of                            previous anesthesia was also reviewed. The risks                            and benefits of the procedure and the sedation                            options and risks were discussed with the patient.                            All questions were answered, and informed consent                            was obtained. Prior Anticoagulants: The patient has                            taken no anticoagulant or antiplatelet agents. ASA                            Grade Assessment: II - A patient with mild systemic                            disease. After reviewing the risks and benefits,  the patient was deemed in satisfactory condition to                            undergo the procedure.                           After obtaining informed consent, the colonoscope                            was passed under direct vision. Throughout the                            procedure, the patient's blood pressure, pulse, and                            oxygen  saturations were monitored continuously. The                            Olympus SN [id number] was introduced through the anus                            and advanced to the the terminal ileum, with                            identification of the appendiceal orifice and IC                            valve. The colonoscopy was performed without                            difficulty. The patient tolerated the procedure                            well. The quality of the bowel preparation was                            good. The terminal ileum, ileocecal valve,                            appendiceal orifice, and rectum were photographed. Scope In: 4:41:29 PM Scope Out: [id number] PM Scope Withdrawal Time: 0 hours 15 minutes 2 seconds  Total Procedure Duration: 0 hours 17 minutes 43 seconds  Findings:                 The perianal and digital rectal examinations were                            normal.                           The terminal ileum appeared normal.                           A diminutive polyp was found in the cecum. The  polyp was sessile. The polyp was removed with a                            cold forceps. Resection and retrieval were complete.                           Two sessile polyps were found in the ascending                            colon. The polyps were 2 to 3 mm in size. These                            polyps were removed with a cold biopsy forceps.                            Resection and retrieval were complete.                           A 5 mm polyp was found in the hepatic flexure. The                            polyp was sessile. The polyp was removed with a                            cold snare. Resection and retrieval were complete.                           Three sessile polyps were found in the transverse                            colon. The polyps were 3 to 4 mm in size. These                            polyps were removed with a  cold snare. Resection                            and retrieval were complete.                           Multiple small-mouthed diverticula were found in                            the entire colon.                           Many flat polyps were found in the recto-sigmoid                            colon and sigmoid colon, grossly consistent with                            hyperplastic polyps. The polyps were small in size  and not removed.                           Internal hemorrhoids were found.                           The exam was otherwise without abnormality.                           Biopsies for histology were taken with a cold                            forceps from the right colon, left colon and                            transverse colon for evaluation of microscopic                            colitis. Complications:            No immediate complications. Estimated blood loss:                            Minimal. Estimated Blood Loss:     Estimated blood loss was minimal. Impression:               - The examined portion of the ileum was normal.                           - One diminutive polyp in the cecum, removed with a                            cold forceps. Resected and retrieved.                           - Two 2 to 3 mm polyps in the ascending colon,                            removed with a cold biopsy forceps. Resected and                            retrieved.                           - One 5 mm polyp at the hepatic flexure, removed                            with a cold snare. Resected and retrieved.                           - Three 3 to 4 mm polyps in the transverse colon,                            removed with a cold snare. Resected and retrieved.                           -  Diverticulosis in the entire examined colon.                           - Numerous benign hyperplastic polyps of the                            recto-sigmoid colon  and in the sigmoid colon, not                            removed.                           - Internal hemorrhoids.                           - The examination was otherwise normal.                           - Biopsies were taken with a cold forceps from the                            right colon, left colon and transverse colon for                            evaluation of microscopic colitis. Recommendation:           - Patient has a contact number available for                            emergencies. The signs and symptoms of potential                            delayed complications were discussed with the                            patient. Return to normal activities tomorrow.                            Written discharge instructions were provided to the                            patient.                           - Resume previous diet.                           - Continue present medications.                           - Await pathology results. Remo Lipps P. Lidiya Reise, MD 03/22/2022 5:06:59 PM This report has been signed electronically.

## 2022-03-25 ENCOUNTER — Telehealth: Payer: Self-pay

## 2022-03-25 NOTE — Telephone Encounter (Signed)
  Follow up Call-     03/22/2022    2:48 PM  Call back number  Post procedure Call Back phone  # [phone]  Permission to leave phone message Yes    Post op call attempted, no answer, left WM.

## 2022-03-27 ENCOUNTER — Telehealth: Payer: Self-pay

## 2022-03-27 NOTE — Telephone Encounter (Signed)
Upper endoscopy and colonoscopy done 03/22/22. Results are in [place].

## 2022-03-27 NOTE — Telephone Encounter (Signed)
Pt is requesting a call back ...  She is wanting to know her results and she is wanting to get some kind of pain med  for back  and hip

## 2022-03-28 ENCOUNTER — Other Ambulatory Visit: Payer: Self-pay | Admitting: Physician Assistant

## 2022-03-28 ENCOUNTER — Telehealth: Payer: Self-pay | Admitting: Orthopaedic Surgery

## 2022-03-28 NOTE — Telephone Encounter (Signed)
Patient would like Hydrocodone  5 refill states she is in pain injection is wearing off

## 2022-03-28 NOTE — Telephone Encounter (Signed)
Pt calling back about her GI Test from [facility]. Pt says she was instructed by [facility] to call her PCP for her results.  Pt states she has not heard from anyone.  Pt is requesting a call back  after 3 pm as she is at work.

## 2022-03-28 NOTE — Telephone Encounter (Signed)
Cannot send in norco, but happy to send in tylenol 3 since she is allergic to tramadol and cannot take that

## 2022-03-29 ENCOUNTER — Encounter: Payer: 59 | Admitting: Internal Medicine

## 2022-03-29 ENCOUNTER — Other Ambulatory Visit: Payer: Self-pay | Admitting: Physician Assistant

## 2022-03-29 MED ORDER — ACETAMINOPHEN-CODEINE 300-30 MG PO TABS: 1.0000 | ORAL_TABLET | Freq: Two times a day (BID) | ORAL | 1 refills | Status: DC | PRN

## 2022-03-29 NOTE — Telephone Encounter (Signed)
sent

## 2022-04-01 ENCOUNTER — Other Ambulatory Visit: Payer: Self-pay

## 2022-04-01 DIAGNOSIS — D132 Benign neoplasm of duodenum: Secondary | ICD-10-CM

## 2022-04-01 DIAGNOSIS — K319 Disease of stomach and duodenum, unspecified: Secondary | ICD-10-CM

## 2022-04-02 ENCOUNTER — Ambulatory Visit (INDEPENDENT_AMBULATORY_CARE_PROVIDER_SITE_OTHER): Payer: 59 | Admitting: Internal Medicine

## 2022-04-02 DIAGNOSIS — F430 Acute stress reaction: Secondary | ICD-10-CM | POA: Diagnosis not present

## 2022-04-02 DIAGNOSIS — M1712 Unilateral primary osteoarthritis, left knee: Secondary | ICD-10-CM

## 2022-04-02 DIAGNOSIS — K317 Polyp of stomach and duodenum: Secondary | ICD-10-CM | POA: Diagnosis not present
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
## 2022-04-03 ENCOUNTER — Telehealth: Payer: Self-pay

## 2022-04-03 DIAGNOSIS — K317 Polyp of stomach and duodenum: Secondary | ICD-10-CM | POA: Insufficient documentation

## 2022-04-03 MED ORDER — IBUPROFEN 800 MG PO TABS: 800.0000 mg | ORAL_TABLET | Freq: Three times a day (TID) | ORAL | 0 refills | Status: DC | PRN

## 2022-04-03 NOTE — Assessment & Plan Note (Signed)
She is having a lot of pain in her left knee.  She is followed with sports medicine and orthopedic surgery for this.  Ortho recommends that she have a TKA when she is ready.  She is saving up money to have her own place and does not feel that her living situation is okay to allow successful recovery from surgery.  She followed up with sports medicine with Dr. Micheline Chapman in November and was given IM Toradol, IM steroid and 10 tablets of hydrocodone. She received Durolane injection 1/16, she had some relief with this.  She is currently taking sertraline for depression/anxiety.  She has not found Voltaren gel to be very beneficial in the past.  She called Ortho office late last week and they sent in Tylenol with codeine which she has not picked up as they were not helpful in the past. A/P: Continue voltaren gel Ibuprofen 800 mg TID PRN, 12 tablets sent to [facility] on [address] Could consider switching sertraline to duloxetine however she feels that sertraline is helping currently so hesitant to switch with other factors at play.

## 2022-04-03 NOTE — Telephone Encounter (Signed)
CT has been scheduled for 04/17/22 at 1 pm.
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
## 2022-04-03 NOTE — Assessment & Plan Note (Signed)
EGD/colonoscopy last week showed several polyps.  Pathology was benign.  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 Havery Moros highly concerned about malignant transformation.  He recommends removal of this, but position makes complicated.  He has ordered CT abdomen and would like this to be completed this week.  Had difficulty getting her to follow-up for GI complaints for the last year as well as to other specialist for studies.  On chart review I see that order has been placed but has not been scheduled yet.  I am concerned that the communication difficulties with patient that imaging may be delayed. A/P: I will check with [facility] office scheduled to see if we can help getting imaging ordered by GI office scheduled sooner.  If not I will place additional order to expedite imaging.

## 2022-04-04 ENCOUNTER — Ambulatory Visit (INDEPENDENT_AMBULATORY_CARE_PROVIDER_SITE_OTHER): Payer: 59 | Admitting: Licensed Clinical Social Worker

## 2022-04-04 DIAGNOSIS — F32A Depression, unspecified: Secondary | ICD-10-CM

## 2022-04-04 NOTE — BH Specialist Note (Signed)
[facility] attempted patient as a courtesy to remind of 3:30 pm Appointment. [facility] unable to leave a VM due to VM being full. [facility] will attempt patient at scheduled time.   Update: 3:42pm   Patient no-showed today's appointment; appointment was for Telephone visit at 3:30Pm  [facility] unsuccessful in phone attempt. Patient VM is Full and [facility] received busy tone on attempts.   Patient will need to reschedule appointment by calling Internal medicine center [phone].  Milus Height, MSW, [place]  [facility] Direct Dial:[phone]  Fax [phone] Main Office Phone: [phone] [place], [place] [address] Website: [place], [place]

## 2022-04-15 NOTE — Progress Notes (Signed)
Internal Medicine Clinic Attending  Case discussed with Dr. Masters  At the time of the visit.  We reviewed the resident's history and exam and pertinent patient test results.  I agree with the assessment, diagnosis, and plan of care documented in the resident's note.  

## 2022-04-17 ENCOUNTER — Ambulatory Visit (HOSPITAL_COMMUNITY)
Admission: RE | Admit: 2022-04-17 | Discharge: 2022-04-17 | Disposition: A | Discharge status: Home / Self Care | Payer: 59 | Source: Ambulatory Visit | Attending: Gastroenterology | Admitting: Gastroenterology

## 2022-04-17 DIAGNOSIS — N281 Cyst of kidney, acquired: Secondary | ICD-10-CM | POA: Diagnosis not present

## 2022-04-17 DIAGNOSIS — D132 Benign neoplasm of duodenum: Secondary | ICD-10-CM

## 2022-04-17 DIAGNOSIS — K319 Disease of stomach and duodenum, unspecified: Secondary | ICD-10-CM | POA: Diagnosis not present

## 2022-04-17 DIAGNOSIS — K7689 Other specified diseases of liver: Secondary | ICD-10-CM | POA: Diagnosis not present

## 2022-04-17 MED ORDER — IOHEXOL 300 MG/ML  SOLN
100.0000 mL | Freq: Once | INTRAMUSCULAR | Status: AC | PRN
  Administered 2022-04-17: 100 mL via INTRAVENOUS

## 2022-04-25 ENCOUNTER — Other Ambulatory Visit: Payer: Self-pay | Admitting: Internal Medicine

## 2022-04-25 DIAGNOSIS — R110 Nausea: Secondary | ICD-10-CM

## 2022-05-07 ENCOUNTER — Ambulatory Visit: Payer: 59 | Admitting: Sports Medicine

## 2022-05-13 DIAGNOSIS — M961 Postlaminectomy syndrome, not elsewhere classified: Secondary | ICD-10-CM | POA: Diagnosis not present

## 2022-05-13 DIAGNOSIS — M5416 Radiculopathy, lumbar region: Secondary | ICD-10-CM | POA: Diagnosis not present

## 2022-05-15 DIAGNOSIS — K317 Polyp of stomach and duodenum: Secondary | ICD-10-CM | POA: Diagnosis not present

## 2022-05-16 ENCOUNTER — Ambulatory Visit: Payer: 59 | Admitting: Orthopaedic Surgery

## 2022-05-31 DIAGNOSIS — M961 Postlaminectomy syndrome, not elsewhere classified: Secondary | ICD-10-CM | POA: Diagnosis not present

## 2022-05-31 DIAGNOSIS — M5416 Radiculopathy, lumbar region: Secondary | ICD-10-CM | POA: Diagnosis not present

## 2022-06-11 ENCOUNTER — Ambulatory Visit (INDEPENDENT_AMBULATORY_CARE_PROVIDER_SITE_OTHER): Payer: 59 | Admitting: Sports Medicine

## 2022-06-11 ENCOUNTER — Other Ambulatory Visit: Payer: Self-pay

## 2022-06-11 DIAGNOSIS — M48062 Spinal stenosis, lumbar region with neurogenic claudication: Secondary | ICD-10-CM | POA: Diagnosis not present

## 2022-06-11 DIAGNOSIS — J301 Allergic rhinitis due to pollen: Secondary | ICD-10-CM

## 2022-06-11 DIAGNOSIS — M5416 Radiculopathy, lumbar region: Secondary | ICD-10-CM

## 2022-06-11 DIAGNOSIS — R110 Nausea: Secondary | ICD-10-CM

## 2022-06-11 DIAGNOSIS — I10 Essential (primary) hypertension: Secondary | ICD-10-CM

## 2022-06-11 MED ORDER — HYDROCODONE-ACETAMINOPHEN 7.5-325 MG PO TABS: 1.0000 | ORAL_TABLET | Freq: Three times a day (TID) | ORAL | 0 refills | Status: DC | PRN

## 2022-06-11 MED ORDER — KETOROLAC TROMETHAMINE 60 MG/2ML IM SOLN
60.0000 mg | Freq: Once | INTRAMUSCULAR | Status: AC
  Administered 2022-06-11: 60 mg via INTRAMUSCULAR

## 2022-06-11 NOTE — Progress Notes (Signed)
   Subjective:    [name], female    DOB: [birth year], 72 y.o.   MRN: [id number]  HPI chief complaint: Right leg numbness  Olivia Ewing presents today with right leg numbness.  She has a history of chronic right leg sciatica.  X-rays of her lumbar spine done last year showed multilevel degenerative disc disease, worse at L2-L3 and L3-L4.  She also has facet changes at L5-S1.  Her numbness is in her calf and in the heel on the right.  They are constantly present.  She also endorses weakness on this side.  She was seen at [facility] recently and they recommended an MRI of her lumbar spine but cost was prohibitive to her there.    Review of Systems As above    Objective:   Physical Exam  Well-developed, well-nourished.  Limited lumbar range of motion.  Neurological exam: There is weakness with great toe extension and ankle dorsiflexion on the right compared to the left.  Decree sensation to light touch in the posterior calf.  No atrophy.  Good pulses.  X-rays of the lumbar spine are as above      Assessment & Plan:   Right leg numbness secondary to lumbar radiculopathy  Patient may benefit from a lumbar epidural steroid injection.  We will get an MRI of her lumbar spine for preprocedural planning.  In the meantime, we will inject her with 60 mg of Toradol today IM.  She has done well with those in the past.  I have also agreed to give her a total of 10 hydrocodone pills (7.5 mg).  I will follow-up with her with MRI results when available.  This note was dictated using Dragon naturally speaking software and may contain errors in syntax, spelling, or content which have not been identified prior to signing this note.

## 2022-06-12 MED ORDER — FLUTICASONE PROPIONATE 50 MCG/ACT NA SUSP: NASAL | 3 refills | Status: DC

## 2022-06-12 MED ORDER — LISINOPRIL-HYDROCHLOROTHIAZIDE 20-12.5 MG PO TABS: 1.0000 | ORAL_TABLET | Freq: Every day | ORAL | 2 refills | Status: DC

## 2022-06-12 MED ORDER — GABAPENTIN 300 MG PO CAPS: ORAL_CAPSULE | ORAL | 3 refills | Status: DC

## 2022-06-12 MED ORDER — AMLODIPINE BESYLATE 5 MG PO TABS: 5.0000 mg | ORAL_TABLET | Freq: Every day | ORAL | 2 refills | Status: DC

## 2022-06-18 DIAGNOSIS — Z885 Allergy status to narcotic agent status: Secondary | ICD-10-CM | POA: Diagnosis not present

## 2022-06-18 DIAGNOSIS — I10 Essential (primary) hypertension: Secondary | ICD-10-CM | POA: Diagnosis not present

## 2022-06-18 DIAGNOSIS — K317 Polyp of stomach and duodenum: Secondary | ICD-10-CM | POA: Diagnosis not present

## 2022-06-18 DIAGNOSIS — K209 Esophagitis, unspecified without bleeding: Secondary | ICD-10-CM | POA: Diagnosis not present

## 2022-06-18 DIAGNOSIS — K2100 Gastro-esophageal reflux disease with esophagitis, without bleeding: Secondary | ICD-10-CM | POA: Diagnosis not present

## 2022-06-23 ENCOUNTER — Inpatient Hospital Stay: Admission: RE | Admit: 2022-06-23 | Payer: 59 | Source: Ambulatory Visit

## 2022-06-28 ENCOUNTER — Other Ambulatory Visit: Payer: 59

## 2022-06-29 ENCOUNTER — Ambulatory Visit
Admission: RE | Admit: 2022-06-29 | Discharge: 2022-06-29 | Disposition: A | Discharge status: Home / Self Care | Payer: 59 | Source: Ambulatory Visit | Attending: Sports Medicine | Admitting: Sports Medicine

## 2022-06-29 DIAGNOSIS — M9963 Osseous and subluxation stenosis of intervertebral foramina of lumbar region: Secondary | ICD-10-CM | POA: Diagnosis not present

## 2022-06-29 DIAGNOSIS — M5416 Radiculopathy, lumbar region: Secondary | ICD-10-CM

## 2022-06-29 DIAGNOSIS — M48062 Spinal stenosis, lumbar region with neurogenic claudication: Secondary | ICD-10-CM

## 2022-07-01 ENCOUNTER — Other Ambulatory Visit (HOSPITAL_COMMUNITY)
Admission: RE | Admit: 2022-07-01 | Discharge: 2022-07-01 | Disposition: A | Discharge status: Home / Self Care | Payer: 59 | Source: Ambulatory Visit | Attending: Obstetrics and Gynecology | Admitting: Obstetrics and Gynecology

## 2022-07-01 ENCOUNTER — Ambulatory Visit (INDEPENDENT_AMBULATORY_CARE_PROVIDER_SITE_OTHER): Payer: 59 | Admitting: Obstetrics and Gynecology

## 2022-07-01 ENCOUNTER — Encounter: Payer: Self-pay | Admitting: Obstetrics and Gynecology

## 2022-07-01 VITALS — BP 146/93 | HR 64 | Ht 65.0 in | Wt 171.4 lb

## 2022-07-01 DIAGNOSIS — Z01419 Encounter for gynecological examination (general) (routine) without abnormal findings: Secondary | ICD-10-CM

## 2022-07-01 DIAGNOSIS — Z1151 Encounter for screening for human papillomavirus (HPV): Secondary | ICD-10-CM | POA: Insufficient documentation

## 2022-07-01 DIAGNOSIS — N898 Other specified noninflammatory disorders of vagina: Secondary | ICD-10-CM | POA: Diagnosis not present

## 2022-07-01 NOTE — Progress Notes (Unsigned)
NGYN presents for annual reports malodorous vaginal discharge and requests all STD testing. ASCUS HPV HR pap on 09/01/2020

## 2022-07-01 NOTE — Progress Notes (Unsigned)
   ANNUAL EXAM Patient name: Olivia Ewing MRN [id number]  Date of birth: 05/02/1950 Chief Complaint:   New Patient (Initial Visit)  History of Present Illness:   Olivia Ewing is a 72 y.o. [phone] with No LMP recorded. Patient is postmenopausal. being seen today for a routine annual exam.  Current complaints:   Here to establish care. Has known hx of mid urethral sling and tubal ligation. Initially had confusion about if she had a hysterectomy, but I was able to locate op note with date of surgery 10/14/06 which confirms TVH, uterosacral ligament suspension & anterior repair with midurethral sling. Her ovaries and tubes were not removed. Pathology benign with chronic endocervicitis & no cervical dysplasia and no endometrial hyperplasia.   Also notes vaginal discharge/odor. Denies any vaginal bleeding.  PapHx: 09/24/16 NILM 11/14/08 vaginal pap NILM 03/02/18 vaginal pap ASC-H/HPV+, neg 16/18/45 09/01/20 vaginal pap ASCUS/HPV+  Last mammogram: 01/11/11. Results were: normal Last colonoscopy: 03/22/22. Results were: abnormal duodenal polyp, unable to be resected endoscopically, planned for surgical removal  Review of Systems:   Pertinent items are noted in HPI Denies any headaches, blurred vision, fatigue, shortness of breath, chest pain, abdominal pain, bowel movements, urination, or intercourse unless otherwise stated above. Pertinent History Reviewed:  Reviewed past medical,surgical, social and family history.  Reviewed problem list, medications and allergies. Physical Assessment:   Vitals:   07/01/22 1322  BP: (!) 146/93  Pulse: 64  Weight: 171 lb 6.4 oz (77.7 kg)  Height: 5\' 5"  (1.651 m)  Body mass index is 28.52 kg/m.        Physical Examination:   General appearance - well appearing, and in no distress  Mental status - alert, oriented to person, place, and time  Chest - respiratory effort normal  Heart - normal peripheral perfusion  Breasts - breasts appear normal, no  suspicious masses, no skin or nipple changes or axillary nodes  Abdomen - soft, nontender, nondistended, no masses or organomegaly  Pelvic - VULVA: normal appearing vulva with no masses, tenderness or lesions  VAGINA: normal appearing vagina with normal color and discharge, no lesions. Has fold at left apex of the cuff with central umbilication, likely the site of USLS  UTERUS: surgically absent, nonpalpabable  ADNEXA: No adnexal masses noted.  Chaperone present for exam  Assessment & Plan:  1) Well-Woman Exam Mammogram: ordered, discussed scheduling ASAP Colonoscopy: per GI Pap: collected GC/CT: collected HIV/HCV: collected  2) Vaginal discharge -     Cervicovaginal ancillary only  Labs/procedures today:  Orders Placed This Encounter  Procedures   Hepatitis B surface antigen   Hepatitis C antibody   RPR   HIV Antibody (routine testing w rflx)   Meds: No orders of the defined types were placed in this encounter.  Follow-up: pending pap results  Lennart Pall, MD 07/01/2022 2:35 PM

## 2022-07-02 LAB — HEPATITIS C ANTIBODY: Hep C Virus Ab: NONREACTIVE

## 2022-07-02 LAB — CERVICOVAGINAL ANCILLARY ONLY
Bacterial Vaginitis (gardnerella): POSITIVE — AB
Candida Glabrata: POSITIVE — AB
Candida Vaginitis: NEGATIVE
Chlamydia: NEGATIVE
Comment: NEGATIVE
Comment: NEGATIVE
Comment: NEGATIVE
Comment: NEGATIVE
Comment: NEGATIVE
Comment: NORMAL
Neisseria Gonorrhea: NEGATIVE
Trichomonas: NEGATIVE

## 2022-07-02 LAB — HEPATITIS B SURFACE ANTIGEN: Hepatitis B Surface Ag: NEGATIVE

## 2022-07-02 LAB — RPR: RPR Ser Ql: NONREACTIVE

## 2022-07-02 LAB — HIV ANTIBODY (ROUTINE TESTING W REFLEX): HIV Screen 4th Generation wRfx: NONREACTIVE

## 2022-07-03 ENCOUNTER — Encounter: Payer: Self-pay | Admitting: Obstetrics and Gynecology

## 2022-07-04 ENCOUNTER — Telehealth: Payer: Self-pay

## 2022-07-04 MED ORDER — BORIC ACID VAGINAL 600 MG VA SUPP: 600.0000 mg | Freq: Every evening | VAGINAL | 0 refills | Status: AC

## 2022-07-04 NOTE — Addendum Note (Signed)
Addended by: Harvie Bridge on: 07/04/2022 12:08 AM   Modules accepted: Orders

## 2022-07-04 NOTE — Telephone Encounter (Signed)
-----   Message from Lennart Pall, MD sent at 07/03/2022  8:57 PM EDT ----- Regarding: Notify lab Hi all! Can we reach out to the lab for this patient? She got a pap with me and we need to let them know that it was a VAGINAL pap not cervical. Thank you! - KF

## 2022-07-04 NOTE — Telephone Encounter (Signed)
Called lab and spoke with Sheryn Bison  to update pap order.

## 2022-07-05 ENCOUNTER — Other Ambulatory Visit: Payer: Self-pay

## 2022-07-05 DIAGNOSIS — M5416 Radiculopathy, lumbar region: Secondary | ICD-10-CM

## 2022-07-05 DIAGNOSIS — M48062 Spinal stenosis, lumbar region with neurogenic claudication: Secondary | ICD-10-CM

## 2022-07-05 NOTE — Progress Notes (Signed)
Spoke with pt regarding her MRI lumbar spine. Dr. Margaretha Sheffield reviewed it and would like to refer her to [facility]ell [facility].  The degenerative changes in her spine have worsened and he would like his opinion as to what the next steps should be.    Pt is agreeable to this plan. Referral placed. They will call her to schedule an appt.

## 2022-07-09 DIAGNOSIS — K317 Polyp of stomach and duodenum: Secondary | ICD-10-CM | POA: Diagnosis not present

## 2022-07-10 LAB — CYTOLOGY - PAP
Comment: NEGATIVE
Comment: NEGATIVE
Comment: NEGATIVE
Diagnosis: NEGATIVE
HPV 16: NEGATIVE
HPV 18 / 45: NEGATIVE
High risk HPV: POSITIVE — AB

## 2022-07-17 ENCOUNTER — Encounter: Payer: Self-pay | Admitting: Obstetrics and Gynecology

## 2022-07-22 ENCOUNTER — Other Ambulatory Visit: Payer: Self-pay

## 2022-07-22 DIAGNOSIS — R110 Nausea: Secondary | ICD-10-CM

## 2022-07-24 ENCOUNTER — Other Ambulatory Visit: Payer: Self-pay | Admitting: Internal Medicine

## 2022-07-24 DIAGNOSIS — J301 Allergic rhinitis due to pollen: Secondary | ICD-10-CM

## 2022-07-25 ENCOUNTER — Other Ambulatory Visit: Payer: Self-pay | Admitting: Internal Medicine

## 2022-07-25 DIAGNOSIS — J301 Allergic rhinitis due to pollen: Secondary | ICD-10-CM

## 2022-07-25 DIAGNOSIS — R110 Nausea: Secondary | ICD-10-CM

## 2022-08-01 ENCOUNTER — Ambulatory Visit: Payer: 59 | Admitting: Orthopedic Surgery

## 2022-08-02 ENCOUNTER — Ambulatory Visit (INDEPENDENT_AMBULATORY_CARE_PROVIDER_SITE_OTHER): Payer: 59 | Admitting: Sports Medicine

## 2022-08-02 ENCOUNTER — Other Ambulatory Visit (HOSPITAL_COMMUNITY): Payer: Self-pay

## 2022-08-02 DIAGNOSIS — M5416 Radiculopathy, lumbar region: Secondary | ICD-10-CM | POA: Diagnosis not present

## 2022-08-02 DIAGNOSIS — M48062 Spinal stenosis, lumbar region with neurogenic claudication: Secondary | ICD-10-CM | POA: Diagnosis not present

## 2022-08-02 MED ORDER — METHYLPREDNISOLONE ACETATE 40 MG/ML IJ SUSP
80.0000 mg | Freq: Once | INTRAMUSCULAR | Status: AC
  Administered 2022-08-02: 80 mg via INTRAMUSCULAR

## 2022-08-02 MED ORDER — HYDROCODONE-ACETAMINOPHEN 7.5-325 MG PO TABS
1.0000 | ORAL_TABLET | Freq: Three times a day (TID) | ORAL | 0 refills | Status: DC | PRN
Start: 2022-08-02 — End: 2022-12-31
  Filled 2022-08-02: qty 10, 4d supply, fill #0

## 2022-08-02 MED ORDER — KETOROLAC TROMETHAMINE 60 MG/2ML IM SOLN
60.0000 mg | Freq: Once | INTRAMUSCULAR | Status: AC
  Administered 2022-08-02: 60 mg via INTRAMUSCULAR

## 2022-08-02 NOTE — Progress Notes (Signed)
  Olivia Ewing - 72 y.o. female MRN [id number]  Date of birth: 20-Feb-1950    CHIEF COMPLAINT:   Low back pain    SUBJECTIVE:   HPI:  Pleasant send-year-old female comes to clinic to be evaluated for low back pain.  She has a chronic well-documented history of back problems going back almost a decade.  She has had spine surgery in the past.  She has been dealing with chronic sciatica that causes pain going all the way down the right leg to the heel which is numb.  It really started flaring up couple days ago.  She comes in to get shots of pain medicine and help her get over this.  She would like another one today.  She denies any numbness or tingling in the groin, loss of bladder control, or weakness in the leg.  ROS:     See HPI  PERTINENT  PMH / PSH FH / / SH:  Past Medical, Surgical, Social, and Family History Reviewed & Updated in the EMR.  Pertinent findings include:  none  OBJECTIVE: BP 134/84   Ht 5\' 6"  (1.676 m)   BMI 27.66 kg/m   Physical Exam:  Vital signs are reviewed.  GEN: Alert and oriented, NAD Pulm: Breathing unlabored PSY: normal Ewing, congruent affect  MSK: L Spine - midline scar well healed.  No overlying skin changes.  Nontender palpation at the lumbar spinous processes.  Tender to palpation at the right lumbar paraspinal musculature.  4/5 strength with resisted hip flexion.  Positive straight leg raise, positive slump test on the right.  ASSESSMENT & PLAN:  1.  Right-sided low back pain with radiculopathy  -Patient is having a flare of her low back pain and sciatica.  She has an upcoming appointment with Dr. Christell Ewing but needs some help with symptom management until then.  Will give her an IM injection of Toradol and Depo-Medrol today.  Also give her another 10 pills of Norco 7.5mg  .  PDMP reviewed by myself during this encounter.  All questions answered agrees to plan.  Olivia Nigh, MD PGY-4, Sports Medicine Fellow [facility]  Addendum:  I was the preceptor for this visit and available for immediate consultation.  Olivia Blizzard MD Olivia Ewing

## 2022-08-12 ENCOUNTER — Telehealth: Payer: Self-pay | Admitting: *Deleted

## 2022-08-12 ENCOUNTER — Other Ambulatory Visit: Payer: Self-pay | Admitting: Internal Medicine

## 2022-08-12 DIAGNOSIS — R110 Nausea: Secondary | ICD-10-CM

## 2022-08-12 MED ORDER — PROMETHAZINE HCL 25 MG PO TABS: 25.0000 mg | ORAL_TABLET | Freq: Three times a day (TID) | ORAL | 0 refills | Status: DC | PRN

## 2022-08-12 NOTE — Telephone Encounter (Signed)
Patient called in teary asking why refill on phenergan was denied. States she stays nauseated 2/2 polyp on intestines. States she has post op appt at [facility] on 7/8 and surgery with [facility] on 08/30/22. Would like refill sent to [facility] on [address].  First available appt with PCP is not till 7/10 at 1315.

## 2022-08-12 NOTE — Progress Notes (Signed)
Patient undergoing laparoscopic surgery at [facility] 07/19 for duodenal polyp. This not likely to be causing chronic nausea. She has been taking phenagren for several years due to nausea. For now will refill phenargren but will haver her follow-up pending surgery to clarify about chronic nausea. EGD 2/24 noted hiatal hernia, also question if this could be GERD related.

## 2022-08-13 ENCOUNTER — Ambulatory Visit (INDEPENDENT_AMBULATORY_CARE_PROVIDER_SITE_OTHER): Payer: 59 | Admitting: Licensed Clinical Social Worker

## 2022-08-13 DIAGNOSIS — F32A Depression, unspecified: Secondary | ICD-10-CM

## 2022-08-13 NOTE — BH Specialist Note (Signed)
Received in-basket from provider regarding scheduling patient. [facility] reviewed chart during encounter.   Christen Butter, MSW, LCSW-A She/Her Behavioral Health Clinician [facility] Direct Dial:[phone]  Fax [phone] Main Office Phone: [phone] [address] [place] [place], [place] [address] Website: [facility] Internal Medicine [facility]  [place], [place]  [place]

## 2022-08-19 DIAGNOSIS — K317 Polyp of stomach and duodenum: Secondary | ICD-10-CM | POA: Diagnosis not present

## 2022-08-19 DIAGNOSIS — Z01818 Encounter for other preprocedural examination: Secondary | ICD-10-CM | POA: Diagnosis not present

## 2022-08-19 DIAGNOSIS — J4520 Mild intermittent asthma, uncomplicated: Secondary | ICD-10-CM | POA: Diagnosis not present

## 2022-08-19 DIAGNOSIS — Z87891 Personal history of nicotine dependence: Secondary | ICD-10-CM | POA: Diagnosis not present

## 2022-08-19 DIAGNOSIS — D509 Iron deficiency anemia, unspecified: Secondary | ICD-10-CM | POA: Diagnosis not present

## 2022-08-19 DIAGNOSIS — Z7182 Exercise counseling: Secondary | ICD-10-CM | POA: Diagnosis not present

## 2022-08-19 DIAGNOSIS — Z713 Dietary counseling and surveillance: Secondary | ICD-10-CM | POA: Diagnosis not present

## 2022-08-19 DIAGNOSIS — K219 Gastro-esophageal reflux disease without esophagitis: Secondary | ICD-10-CM | POA: Diagnosis not present

## 2022-08-19 DIAGNOSIS — I10 Essential (primary) hypertension: Secondary | ICD-10-CM | POA: Diagnosis not present

## 2022-08-19 DIAGNOSIS — G4709 Other insomnia: Secondary | ICD-10-CM | POA: Diagnosis not present

## 2022-08-19 DIAGNOSIS — E785 Hyperlipidemia, unspecified: Secondary | ICD-10-CM | POA: Diagnosis not present

## 2022-08-19 DIAGNOSIS — F32A Depression, unspecified: Secondary | ICD-10-CM | POA: Diagnosis not present

## 2022-08-20 ENCOUNTER — Other Ambulatory Visit: Payer: Self-pay | Admitting: *Deleted

## 2022-08-20 MED ORDER — PREDNISONE 10 MG PO TABS
ORAL_TABLET | ORAL | 0 refills | Status: DC
Start: 2022-08-20 — End: 2022-08-21

## 2022-08-21 ENCOUNTER — Other Ambulatory Visit: Payer: Self-pay

## 2022-08-21 ENCOUNTER — Encounter: Payer: Self-pay | Admitting: Internal Medicine

## 2022-08-21 ENCOUNTER — Ambulatory Visit (INDEPENDENT_AMBULATORY_CARE_PROVIDER_SITE_OTHER): Payer: 59 | Admitting: Internal Medicine

## 2022-08-21 VITALS — BP 117/87 | HR 92 | Temp 98.2°F | Ht 65.0 in | Wt 173.5 lb

## 2022-08-21 DIAGNOSIS — M48062 Spinal stenosis, lumbar region with neurogenic claudication: Secondary | ICD-10-CM | POA: Diagnosis not present

## 2022-08-21 DIAGNOSIS — K317 Polyp of stomach and duodenum: Secondary | ICD-10-CM | POA: Diagnosis not present

## 2022-08-21 DIAGNOSIS — R87611 Atypical squamous cells cannot exclude high grade squamous intraepithelial lesion on cytologic smear of cervix (ASC-H): Secondary | ICD-10-CM | POA: Diagnosis not present

## 2022-08-21 MED ORDER — OXYCODONE-ACETAMINOPHEN 7.5-325 MG PO TABS
1.0000 | ORAL_TABLET | Freq: Every day | ORAL | 0 refills | Status: AC
Start: 2022-08-21 — End: 2022-08-26

## 2022-08-21 MED ORDER — GABAPENTIN 600 MG PO TABS
600.0000 mg | ORAL_TABLET | Freq: Three times a day (TID) | ORAL | 2 refills | Status: DC
Start: 2022-08-21 — End: 2022-12-16

## 2022-08-21 NOTE — Patient Instructions (Signed)
Thank you, [name] for allowing [name] to provide your care today.   Back pain I sent in 5 tablets to help you get to appointment with ortho. I also recommend increasing gabapentin to 600 mg, continue to take 3 times daily. Ortho has some additional steroid injections they can try. I do not recommend that you pick up steroids for your back with your upcoming surgery.  We can not prescribe pain medications long term and I think it would be best to follow-up with ortho.  Im hoping that nausea improves with surgery. Wish you the best with that.  I have ordered the following medication/changed the following medications:   Stop the following medications: Medications Discontinued During This Encounter  Medication Reason   gabapentin (NEURONTIN) 300 MG capsule      Start the following medications: Meds ordered this encounter  Medications   oxyCODONE-acetaminophen (PERCOCET) 7.5-325 MG tablet    Sig: Take 1 tablet by mouth daily for 5 days.    Dispense:  5 tablet    Refill:  0   gabapentin (NEURONTIN) 600 MG tablet    Sig: Take 1 tablet (600 mg total) by mouth 3 (three) times daily.    Dispense:  90 tablet    Refill:  2     Follow up: 2 months   We look forward to seeing you next time. Please call our clinic at [phone] if you have any questions or concerns. The best time to call is Monday-Friday from 9am-4pm, but there is someone available 24/7. If after hours or the weekend, call the main hospital number and ask for the Internal Medicine Resident On-Call. If you need medication refills, please notify your pharmacy one week in advance and they will send [name] a request.   Thank you for trusting me with your care. Wishing you the best!   Rudene Christians, DO [facility]

## 2022-08-21 NOTE — Progress Notes (Addendum)
Subjective:  CC: right leg pain  HPI:  [name] is a 72 y.o. female with a past medical history stated below and presents today for right leg pain that has worsened over last 2 months.  She was recently seen by sports medicine for this problem and given IM pain medications and PO steroids.  She has chronic back pain with history of laminectomy about 4 years ago. She was told not to life anything over 10 lbs following this surgery. She lifted case of water at grocery store a few weeks ago and is concerned that could have been trigger for pain.  Her right heel is numb and she is having shooting pain into her right leg. She denies saddle anesthesia, bowel and bladder incontinence. Please see problem based assessment and plan for additional details.  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   Steroid injections: 09/2013 12/2013    Lumbar radiculopathy, chronic    Nontraumatic incomplete tear of right rotator cuff 07/27/2019   Primary osteoarthritis of right hip 04/27/2018   Right knee meniscal tear 03/14/2011   Right knee pain    posterior horn medial meniscal tear MRI 2013   Septic arthritis (HCC) 02/25/2020   Spinal stenosis    getting epidural injections -last one 06/12/2015   Status post lumbar laminectomy 03/16/2019   Status post total hip replacement, 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 (Patient not taking: Reported on 07/01/2022) 20 tablet 1   albuterol (VENTOLIN HFA) 108 (90 Base) MCG/ACT inhaler INHALE 1 TO 2 PUFFS INTO THE LUNGS EVERY 6 HOURS AS NEEDED FOR SHORTNESS OF BREATH 8.5 g 3   amLODipine (NORVASC) 5 MG tablet Take 1 tablet (5 mg total) by mouth daily. 90 tablet 2   COD LIVER OIL PO Take by mouth.     famotidine (PEPCID) 20 MG tablet TAKE 1 TABLET BY MOUTH TWICE A DAY 60 tablet 2   ferrous gluconate (FERGON) 324 MG tablet Take 1 tablet (324 mg total) by mouth daily with breakfast. 60 tablet 1   fluticasone (FLONASE) 50 MCG/ACT nasal spray USE 1 OR 2 SPRAYS IN EACH NOSTRIL DAILY 16 g 3   fluticasone (FLOVENT HFA) 44 MCG/ACT inhaler Inhale 1 puff into the lungs 2 (two) times daily. 1 each 2   HYDROcodone-acetaminophen (NORCO) 7.5-325 MG tablet Take 1 tablet by mouth every 8 (eight) hours as needed for moderate pain. 10 tablet 0   ibuprofen (ADVIL) 800 MG tablet Take 1 tablet (800 mg total) by mouth every 8 (eight) hours as needed. (Patient not taking: Reported on 07/01/2022) 12 tablet 0   lisinopril-hydrochlorothiazide (ZESTORETIC)  20-12.5 MG tablet Take 1 tablet by mouth daily. 90 tablet 2   loratadine (CLARITIN) 10 MG tablet Take 1 tablet (10 mg total) by mouth daily. 30 tablet 0   metoCLOPramide (REGLAN) 5 MG tablet Take 1 tablet (5 mg total) by mouth 3 (three) times daily as needed for nausea. (Patient not taking: Reported on 07/01/2022) 90 tablet 1   Multiple Vitamin (MULTIVITAMIN WITH MINERALS) TABS tablet Take 1 tablet by mouth daily.     omeprazole (PRILOSEC) 40 MG capsule Take 1 capsule (40 mg total) by mouth in the morning and at bedtime. 60 capsule 3   promethazine (PHENERGAN) 25 MG tablet Take 1 tablet (25 mg total) by mouth every 8 (eight) hours as needed. 30 tablet 0   QUEtiapine (SEROQUEL) 100 MG  tablet TAKE 1 TABLET BY MOUTH EVERY NIGHT AT BEDTIME 90 tablet 3   rosuvastatin (CRESTOR) 20 MG tablet Take 1 tablet (20 mg total) by mouth daily. 90 tablet 1   sertraline (ZOLOFT) 100 MG tablet Take 2 tablets (200 mg total) by mouth daily. 180 tablet 3   sodium chloride (OCEAN) 0.65 % SOLN nasal spray Place 1 spray into both nostrils 2 (two) times daily as needed. 30 mL 0   sucralfate (CARAFATE) 1 GM/10ML suspension Take 10 mLs (1 g total) by mouth every 6 (six) hours as needed. 420 mL 1   No current facility-administered medications on file prior to visit.    Family History  Problem Relation Age of Onset   Hypertension Mother    Colon cancer Neg Hx    Esophageal cancer Neg Hx    Rectal cancer Neg Hx    Stomach cancer Neg Hx     Social History   Socioeconomic History   Marital status: Divorced    Spouse name: Not on file   Number of children: 4   Years of education: Not on file   Highest education level: Not on file  Occupational History   Not on file  Tobacco Use   Smoking status: Former    Current packs/day: 0.00    Types: Cigarettes    Quit date: 11/26/1983    Years since quitting: 38.7    Passive exposure: Never   Smokeless tobacco: Never  Vaping Use   Vaping status: Never Used  Substance and Sexual Activity   Alcohol use: Yes    Alcohol/week: 0.0 standard drinks of alcohol    Comment: rarely   Drug use: Not Currently    Types: Marijuana    Comment: last used 2019   Sexual activity: Yes    Birth control/protection: Surgical  Other Topics Concern   Not on file  Social History Narrative   Single. 9 siblings. 1 brother with HIV, 1 sister with hypertension. 4 children, 1 daughter with HTN and depression, 1 son with HTN.   [phone]- shelter's number.    Former smoker, no alcohol or drug use.   Social Determinants of Health   Financial Resource Strain: Low Risk  (12/12/2021)   Overall Financial Resource Strain (CARDIA)    Difficulty of Paying Living Expenses:  Not hard at all  Food Insecurity: No Food Insecurity (12/12/2021)   Hunger Vital Sign    Worried About Running Out of Food in the Last Year: Never true    Ran Out of Food in the Last Year: Never true  Transportation Needs: No Transportation Needs (12/27/2020)   PRAPARE - Transportation    Lack of Transportation (Medical): No    Lack of  Transportation (Non-Medical): No  Physical Activity: Sufficiently Active (12/12/2021)   Exercise Vital Sign    Days of Exercise per Week: 7 days    Minutes of Exercise per Session: 30 min  Stress: Stress Concern Present (12/12/2021)   [employer] of Occupational Health - Occupational Stress Questionnaire    Feeling of Stress : Very much  Social Connections: Moderately Integrated (12/12/2021)   Social Connection and Isolation Panel [NHANES]    Frequency of Communication with Friends and Family: More than three times a week    Frequency of Social Gatherings with Friends and Family: More than three times a week    Attends Religious Services: 1 to 4 times per year    Active Member of [employer] or Organizations: No    Attends Banker Meetings: 1 to 4 times per year    Marital Status: Never married  Intimate Partner Violence: Not At Risk (12/12/2021)   Humiliation, Afraid, Rape, and Kick questionnaire    Fear of Current or Ex-Partner: No    Emotionally Abused: No    Physically Abused: No    Sexually Abused: No    Review of Systems: ROS negative except for what is noted on the assessment and plan.  Objective:   Vitals:   08/21/22 1322  BP: 117/87  Pulse: 92  Temp: 98.2 F (36.8 C)  TempSrc: Oral  SpO2: 100%  Weight: 173 lb 8 oz (78.7 kg)  Height: 5\' 5"  (1.651 m)    Physical Exam: Constitutional: well-appearing  Cardiovascular: regular rate and rhythm, no m/r/g Pulmonary/Chest: normal work of breathing on room air, lungs clear to auscultation bilaterally Abdominal: soft, non-tender, non-distended MSK: well healed surgical scars  over L2-L5, TTP over scar and paraspinal muscles, motion limited due to pain Neurological: 5/5 strength in bilateral upper and lower extremities, antalgic gait Skin: warm and dry   Assessment & Plan:  Polyp of duodenum Laparoscopic versus open resection planned for 07 19 with [facility] for 10 cm polyp in duodenum. She's been taking promethazine for several years due to nausea. EKG repeated at pre-surg test with Qtc at 447. Will continue with promethazine for now but would expect symptoms to improve after surgery.  Lumbar stenosis with neurogenic claudication Chronic back pain that has worsened over last 2 months. She has difficulty ambulating and sleeping at times due to pain. She was seen Dr. Margaretha Sheffield 04/24 due to worsening of symptoms and MRI was ordered. This showed severe left  and moderate right foraminal stenosis that was progressed. She has follow-up with ortho on 07/16. She was given 10 tablets of hydrocodone at sports medicine visit 08/02/22 and has taken them when pain is worst.  P: Increase gabapentin to 600 mg TID 10 tablets of oxycodone-acetaminophen sent, talked with her that this would be one time medication to help her get to ortho appointment. She was encouraged to follow-up with ortho on 07/16 for possible steroid injection.  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 Berton Lan planning for repeat colpsocopy after surgery in July.   Patient discussed with Dr. Precious Bard Liza Czerwinski, D.O. [facility] Internal Medicine  PGY-3 Pager: [phone]  Phone: [phone] Date 08/29/2022  Time 5:03 PM

## 2022-08-22 NOTE — Assessment & Plan Note (Addendum)
Dr. Berton Lan planning for repeat colpsocopy after surgery in July.

## 2022-08-22 NOTE — Assessment & Plan Note (Signed)
Chronic back pain that has worsened over last 2 months. She has difficulty ambulating and sleeping at times due to pain. She was seen Dr. Margaretha Sheffield 04/24 due to worsening of symptoms and MRI was ordered. This showed severe left  and moderate right foraminal stenosis that was progressed. She has follow-up with ortho on 07/16. She was given 10 tablets of hydrocodone at sports medicine visit 08/02/22 and has taken them when pain is worst.  P: Increase gabapentin to 600 mg TID 10 tablets of oxycodone-acetaminophen sent, talked with her that this would be one time medication to help her get to ortho appointment. She was encouraged to follow-up with ortho on 07/16 for possible steroid injection.

## 2022-08-22 NOTE — Assessment & Plan Note (Addendum)
Laparoscopic versus open resection planned for 07 19 with [facility] for 10 cm polyp in duodenum. She's been taking promethazine for several years due to nausea. EKG repeated at pre-surg test with Qtc at 447. Will continue with promethazine for now but would expect symptoms to improve after surgery.

## 2022-08-27 ENCOUNTER — Ambulatory Visit: Payer: 59 | Admitting: Physician Assistant

## 2022-08-28 NOTE — Progress Notes (Signed)
Internal Medicine Clinic Attending  Case discussed with Dr. Sloan Leiter  At the time of the visit.  We reviewed the resident's history and exam and pertinent patient test results.  I agree with the assessment, diagnosis, and plan of care documented in the resident's note with the following correction:  Gynecology, Dr. Berton Lan, following for history of abnormal cervical cancer screening. Ms. 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 Dr. Berton Lan would like to complete vaginal colposcopy and will plan to discuss this after her surgery this summer.

## 2022-09-12 DIAGNOSIS — F32A Depression, unspecified: Secondary | ICD-10-CM | POA: Diagnosis not present

## 2022-09-12 DIAGNOSIS — K922 Gastrointestinal hemorrhage, unspecified: Secondary | ICD-10-CM | POA: Diagnosis not present

## 2022-09-12 DIAGNOSIS — K651 Peritoneal abscess: Secondary | ICD-10-CM | POA: Diagnosis not present

## 2022-09-12 DIAGNOSIS — Z79899 Other long term (current) drug therapy: Secondary | ICD-10-CM | POA: Diagnosis not present

## 2022-09-12 DIAGNOSIS — K6389 Other specified diseases of intestine: Secondary | ICD-10-CM | POA: Diagnosis not present

## 2022-09-12 DIAGNOSIS — Z4659 Encounter for fitting and adjustment of other gastrointestinal appliance and device: Secondary | ICD-10-CM | POA: Diagnosis not present

## 2022-09-12 DIAGNOSIS — K9189 Other postprocedural complications and disorders of digestive system: Secondary | ICD-10-CM | POA: Diagnosis not present

## 2022-09-12 DIAGNOSIS — Z91048 Other nonmedicinal substance allergy status: Secondary | ICD-10-CM | POA: Diagnosis not present

## 2022-09-12 DIAGNOSIS — Z789 Other specified health status: Secondary | ICD-10-CM | POA: Diagnosis not present

## 2022-09-12 DIAGNOSIS — K828 Other specified diseases of gallbladder: Secondary | ICD-10-CM | POA: Diagnosis not present

## 2022-09-12 DIAGNOSIS — G8918 Other acute postprocedural pain: Secondary | ICD-10-CM | POA: Diagnosis not present

## 2022-09-12 DIAGNOSIS — E785 Hyperlipidemia, unspecified: Secondary | ICD-10-CM | POA: Diagnosis not present

## 2022-09-12 DIAGNOSIS — I517 Cardiomegaly: Secondary | ICD-10-CM | POA: Diagnosis not present

## 2022-09-12 DIAGNOSIS — Z87891 Personal history of nicotine dependence: Secondary | ICD-10-CM | POA: Diagnosis not present

## 2022-09-12 DIAGNOSIS — R935 Abnormal findings on diagnostic imaging of other abdominal regions, including retroperitoneum: Secondary | ICD-10-CM | POA: Diagnosis not present

## 2022-09-12 DIAGNOSIS — E441 Mild protein-calorie malnutrition: Secondary | ICD-10-CM | POA: Diagnosis not present

## 2022-09-12 DIAGNOSIS — Z90410 Acquired total absence of pancreas: Secondary | ICD-10-CM | POA: Diagnosis not present

## 2022-09-12 DIAGNOSIS — K921 Melena: Secondary | ICD-10-CM | POA: Diagnosis not present

## 2022-09-12 DIAGNOSIS — F5101 Primary insomnia: Secondary | ICD-10-CM | POA: Diagnosis not present

## 2022-09-12 DIAGNOSIS — K316 Fistula of stomach and duodenum: Secondary | ICD-10-CM | POA: Diagnosis not present

## 2022-09-12 DIAGNOSIS — I951 Orthostatic hypotension: Secondary | ICD-10-CM | POA: Diagnosis not present

## 2022-09-12 DIAGNOSIS — K838 Other specified diseases of biliary tract: Secondary | ICD-10-CM | POA: Diagnosis not present

## 2022-09-12 DIAGNOSIS — Z885 Allergy status to narcotic agent status: Secondary | ICD-10-CM | POA: Diagnosis not present

## 2022-09-12 DIAGNOSIS — D509 Iron deficiency anemia, unspecified: Secondary | ICD-10-CM | POA: Diagnosis not present

## 2022-09-12 DIAGNOSIS — I10 Essential (primary) hypertension: Secondary | ICD-10-CM | POA: Diagnosis not present

## 2022-09-12 DIAGNOSIS — G3184 Mild cognitive impairment, so stated: Secondary | ICD-10-CM | POA: Diagnosis not present

## 2022-09-12 DIAGNOSIS — D132 Benign neoplasm of duodenum: Secondary | ICD-10-CM | POA: Diagnosis not present

## 2022-09-12 DIAGNOSIS — G4700 Insomnia, unspecified: Secondary | ICD-10-CM | POA: Diagnosis not present

## 2022-09-12 DIAGNOSIS — K317 Polyp of stomach and duodenum: Secondary | ICD-10-CM | POA: Diagnosis not present

## 2022-09-12 DIAGNOSIS — J984 Other disorders of lung: Secondary | ICD-10-CM | POA: Diagnosis not present

## 2022-09-12 DIAGNOSIS — Z9189 Other specified personal risk factors, not elsewhere classified: Secondary | ICD-10-CM | POA: Diagnosis not present

## 2022-09-12 DIAGNOSIS — R1084 Generalized abdominal pain: Secondary | ICD-10-CM | POA: Diagnosis not present

## 2022-09-13 DIAGNOSIS — Z9189 Other specified personal risk factors, not elsewhere classified: Secondary | ICD-10-CM | POA: Diagnosis not present

## 2022-09-13 DIAGNOSIS — F5101 Primary insomnia: Secondary | ICD-10-CM | POA: Diagnosis not present

## 2022-09-13 DIAGNOSIS — Z4659 Encounter for fitting and adjustment of other gastrointestinal appliance and device: Secondary | ICD-10-CM | POA: Diagnosis not present

## 2022-09-13 DIAGNOSIS — Z90410 Acquired total absence of pancreas: Secondary | ICD-10-CM | POA: Diagnosis not present

## 2022-09-13 DIAGNOSIS — G3184 Mild cognitive impairment, so stated: Secondary | ICD-10-CM | POA: Diagnosis not present

## 2022-09-13 DIAGNOSIS — F32A Depression, unspecified: Secondary | ICD-10-CM | POA: Diagnosis not present

## 2022-09-15 DIAGNOSIS — Z4659 Encounter for fitting and adjustment of other gastrointestinal appliance and device: Secondary | ICD-10-CM | POA: Diagnosis not present

## 2022-10-10 ENCOUNTER — Telehealth: Payer: Self-pay

## 2022-10-10 ENCOUNTER — Telehealth: Payer: Self-pay | Admitting: *Deleted

## 2022-10-10 DIAGNOSIS — I10 Essential (primary) hypertension: Secondary | ICD-10-CM | POA: Diagnosis not present

## 2022-10-10 DIAGNOSIS — D509 Iron deficiency anemia, unspecified: Secondary | ICD-10-CM | POA: Diagnosis not present

## 2022-10-10 DIAGNOSIS — E559 Vitamin D deficiency, unspecified: Secondary | ICD-10-CM | POA: Diagnosis not present

## 2022-10-10 DIAGNOSIS — K219 Gastro-esophageal reflux disease without esophagitis: Secondary | ICD-10-CM | POA: Diagnosis not present

## 2022-10-10 DIAGNOSIS — K922 Gastrointestinal hemorrhage, unspecified: Secondary | ICD-10-CM | POA: Diagnosis not present

## 2022-10-10 DIAGNOSIS — E785 Hyperlipidemia, unspecified: Secondary | ICD-10-CM | POA: Diagnosis not present

## 2022-10-10 DIAGNOSIS — Z48815 Encounter for surgical aftercare following surgery on the digestive system: Secondary | ICD-10-CM | POA: Diagnosis not present

## 2022-10-10 DIAGNOSIS — G4700 Insomnia, unspecified: Secondary | ICD-10-CM | POA: Diagnosis not present

## 2022-10-10 DIAGNOSIS — J45909 Unspecified asthma, uncomplicated: Secondary | ICD-10-CM | POA: Diagnosis not present

## 2022-10-10 DIAGNOSIS — K317 Polyp of stomach and duodenum: Secondary | ICD-10-CM | POA: Diagnosis not present

## 2022-10-10 DIAGNOSIS — G5603 Carpal tunnel syndrome, bilateral upper limbs: Secondary | ICD-10-CM | POA: Diagnosis not present

## 2022-10-10 DIAGNOSIS — Z9049 Acquired absence of other specified parts of digestive tract: Secondary | ICD-10-CM | POA: Diagnosis not present

## 2022-10-10 DIAGNOSIS — M1990 Unspecified osteoarthritis, unspecified site: Secondary | ICD-10-CM | POA: Diagnosis not present

## 2022-10-10 DIAGNOSIS — Z4803 Encounter for change or removal of drains: Secondary | ICD-10-CM | POA: Diagnosis not present

## 2022-10-10 DIAGNOSIS — K9189 Other postprocedural complications and disorders of digestive system: Secondary | ICD-10-CM | POA: Diagnosis not present

## 2022-10-10 DIAGNOSIS — K921 Melena: Secondary | ICD-10-CM | POA: Diagnosis not present

## 2022-10-10 NOTE — Transitions of Care (Post Inpatient/ED Visit) (Signed)
10/10/2022  Name: Olivia Ewing MRN: [id number] DOB: 10-13-1950  Today's TOC FU Call Status: Today's TOC FU Call Status:: Successful TOC FU Call Completed TOC FU Call Complete Date: 10/10/22 Patient's Name and Date of Birth confirmed.  Transition Care Management Follow-up Telephone Call Date of Discharge: 10/09/22 Discharge Facility: Other [facility]) Name of Other ([facility]) Discharge Facility: [facility] Type of Discharge: Inpatient Admission Primary Inpatient Discharge Diagnosis:: Duodenal Polyp How have you been since you were released from the hospital?: Better (Patient feeling weak but better) Any questions or concerns?: No  Items Reviewed: Did you receive and understand the discharge instructions provided?: Yes Medications obtained,verified, and reconciled?: Yes (Medications Reviewed) Any new allergies since your discharge?: No Dietary orders reviewed?: No Do you have support at home?: Yes People in Home: child(ren), adult Name of Support/Comfort Primary Source: [place]  Medications Reviewed Today: Medications Reviewed Today     Reviewed by Jodelle Gross, RN (Case Manager) on 10/10/22 at 1322  Med List Status: <None>   Medication Order Taking? Sig Documenting Provider Last Dose Status Informant  acetaminophen-codeine (TYLENOL #3) 300-30 MG tablet [id number]  Take 1 tablet by mouth 2 (two) times daily as needed for moderate pain.  Patient not taking: Reported on 07/01/2022   Ivin Poot  Active   albuterol (VENTOLIN HFA) 108 (90 Base) MCG/ACT inhaler [id number] Yes INHALE 1 TO 2 PUFFS INTO THE LUNGS EVERY 6 HOURS AS NEEDED FOR SHORTNESS OF BREATH Masters, Florentina Addison, DO Taking Active   amLODipine (NORVASC) 5 MG tablet [id number] Yes Take 1 tablet (5 mg total) by mouth daily. Masters, Florentina Addison, DO Taking Active   COD LIVER OIL PO [id number] Yes Take by mouth. [provider] Taking Active   famotidine (PEPCID) 20 MG tablet [id number] Yes TAKE 1 TABLET BY MOUTH  TWICE A DAY Masters, Katie, DO Taking Active   ferrous gluconate (FERGON) 324 MG tablet [id number] Yes Take 1 tablet (324 mg total) by mouth daily with breakfast. Masters, Florentina Addison, DO Taking Active   fluticasone (FLONASE) 50 MCG/ACT nasal spray [id number] Yes USE 1 OR 2 SPRAYS IN EACH NOSTRIL DAILY Masters, Florentina Addison, DO Taking Active   fluticasone (FLOVENT HFA) 44 MCG/ACT inhaler [id number]  Inhale 1 puff into the lungs 2 (two) times daily. Masters, Katie, DO  Expired 12/13/21 2359   gabapentin (NEURONTIN) 600 MG tablet [id number] Yes Take 1 tablet (600 mg total) by mouth 3 (three) times daily. Masters, Florentina Addison, DO Taking Active   HYDROcodone-acetaminophen (NORCO) 7.5-325 MG tablet [id number] Yes Take 1 tablet by mouth every 8 (eight) hours as needed for moderate pain. Rafoth, Baldemar Friday, MD Taking Active   ibuprofen (ADVIL) 800 MG tablet [id number] No Take 1 tablet (800 mg total) by mouth every 8 (eight) hours as needed.  Patient not taking: Reported on 07/01/2022   Rudene Christians, DO Not Taking Active   lisinopril-hydrochlorothiazide (ZESTORETIC) 20-12.5 MG tablet [id number] Yes Take 1 tablet by mouth daily. Masters, Florentina Addison, DO Taking Active   loratadine (CLARITIN) 10 MG tablet [id number]  Take 1 tablet (10 mg total) by mouth daily. Roylene Reason, MD  Expired 08/23/20 2359   metoCLOPramide (REGLAN) 5 MG tablet [id number] No Take 1 tablet (5 mg total) by mouth 3 (three) times daily as needed for nausea.  Patient not taking: Reported on 07/01/2022   Benancio Deeds, MD Not Taking Active   Multiple Vitamin (MULTIVITAMIN WITH MINERALS) TABS tablet [id number] Yes Take 1 tablet by mouth daily. [provider] Taking Active Self  omeprazole (PRILOSEC)  40 MG capsule [id number] Yes Take 1 capsule (40 mg total) by mouth in the morning and at bedtime. Benancio Deeds, MD Taking Active   ondansetron (ZOFRAN-ODT) 4 MG disintegrating tablet [id number] Yes Take 4 mg by mouth every 8 (eight) hours as needed.  [provider] Taking Active   oxyCODONE (OXY IR/ROXICODONE) 5 MG immediate release tablet [id number] Yes Take 5 mg by mouth every 4 (four) hours as needed for moderate pain or severe pain. [provider] Taking Active   promethazine (PHENERGAN) 25 MG tablet [id number] Yes Take 1 tablet (25 mg total) by mouth every 8 (eight) hours as needed. Masters, Florentina Addison, DO Taking Active   QUEtiapine (SEROQUEL) 100 MG tablet [id number] Yes TAKE 1 TABLET BY MOUTH EVERY NIGHT AT BEDTIME Masters, Katie, DO Taking Active   rosuvastatin (CRESTOR) 20 MG tablet [id number]  Take 1 tablet (20 mg total) by mouth daily. Masters, Katie, DO  Expired 05/04/22 2359   sertraline (ZOLOFT) 100 MG tablet [id number] Yes Take 2 tablets (200 mg total) by mouth daily. Masters, Florentina Addison, DO Taking Active   sodium chloride (OCEAN) 0.65 % SOLN nasal spray [id number] Yes Place 1 spray into both nostrils 2 (two) times daily as needed. Seawell, Jaimie A, DO Taking Active   sucralfate (CARAFATE) 1 GM/10ML suspension [id number] Yes Take 10 mLs (1 g total) by mouth every 6 (six) hours as needed. Benancio Deeds, MD Taking Active             Home Care and Equipment/Supplies: Were Home Health Services Ordered?: Yes Name of Home Health Agency:: [facility] Has Agency set up a time to come to your home?: Yes First Home Health Visit Date: 10/10/22 Any new equipment or medical supplies ordered?: No  Functional Questionnaire: Do you need assistance with bathing/showering or dressing?: Yes Do you need assistance with meal preparation?: No Do you need assistance with eating?: No Do you have difficulty maintaining continence: No Do you need assistance with getting out of bed/getting out of a chair/moving?: Yes Do you have difficulty managing or taking your medications?: No  Follow up appointments reviewed: PCP Follow-up appointment confirmed?: No (Message sent to [facility] to request HFU) MD Provider Line Number:[phone] Given:  No Specialist Hospital Follow-up appointment confirmed?: Yes Date of Specialist follow-up appointment?: 10/17/22 Follow-Up Specialty Provider:: Dr. Sherryll Burger Do you need transportation to your follow-up appointment?: No Do you understand care options if your condition(s) worsen?: Yes-patient verbalized understanding  SDOH Interventions Today    Flowsheet Row Most Recent Value  SDOH Interventions   Food Insecurity Interventions Intervention Not Indicated  Housing Interventions Intervention Not Indicated  Transportation Interventions Intervention Not Indicated  Utilities Interventions Intervention Not Indicated      TOC Interventions Today    Flowsheet Row Most Recent Value  TOC Interventions   TOC Interventions Discussed/Reviewed TOC Interventions Discussed, TOC Interventions Reviewed, Contacted provider for patient needs       Jodelle Gross RN, BSN, CCM [facility] RN Care Coordinator/ Transitions of Care Direct Dial: [phone]  Fax: [phone]

## 2022-10-10 NOTE — Transitions of Care (Post Inpatient/ED Visit) (Signed)
   10/10/2022  Name: Olivia Ewing MRN: [id number] DOB: [birth year]  Today's TOC FU Call Status: Today's TOC FU Call Status:: Unsuccessful Call (1st Attempt) Unsuccessful Call (1st Attempt) Date: 10/10/22  Attempted to reach the patient regarding the most recent Inpatient/ED visit.  Follow Up Plan: Additional outreach attempts will be made to reach the patient to complete the Transitions of Care (Post Inpatient/ED visit) call.   Jodelle Gross RN, BSN, CCM [facility] RN Care Coordinator/ Transitions of Care Direct Dial: [phone]  Fax: [phone]

## 2022-10-10 NOTE — Telephone Encounter (Signed)
Call from [place] PT with [facility] stating pt needs a rolator walker with a seat and a bedside commode (to place over her commode). I will ask the doctor to place DME orders. Also verbal order given for SW to help pt become independent again and verbal order given for Nursing to f/u with pt's drain. Thanks

## 2022-10-15 DIAGNOSIS — M1990 Unspecified osteoarthritis, unspecified site: Secondary | ICD-10-CM | POA: Diagnosis not present

## 2022-10-15 DIAGNOSIS — K317 Polyp of stomach and duodenum: Secondary | ICD-10-CM | POA: Diagnosis not present

## 2022-10-15 DIAGNOSIS — K921 Melena: Secondary | ICD-10-CM | POA: Diagnosis not present

## 2022-10-15 DIAGNOSIS — G5603 Carpal tunnel syndrome, bilateral upper limbs: Secondary | ICD-10-CM | POA: Diagnosis not present

## 2022-10-15 DIAGNOSIS — K219 Gastro-esophageal reflux disease without esophagitis: Secondary | ICD-10-CM | POA: Diagnosis not present

## 2022-10-15 DIAGNOSIS — I10 Essential (primary) hypertension: Secondary | ICD-10-CM | POA: Diagnosis not present

## 2022-10-15 DIAGNOSIS — K9189 Other postprocedural complications and disorders of digestive system: Secondary | ICD-10-CM | POA: Diagnosis not present

## 2022-10-15 DIAGNOSIS — J45909 Unspecified asthma, uncomplicated: Secondary | ICD-10-CM | POA: Diagnosis not present

## 2022-10-15 DIAGNOSIS — Z48815 Encounter for surgical aftercare following surgery on the digestive system: Secondary | ICD-10-CM | POA: Diagnosis not present

## 2022-10-15 DIAGNOSIS — G4700 Insomnia, unspecified: Secondary | ICD-10-CM | POA: Diagnosis not present

## 2022-10-15 DIAGNOSIS — E785 Hyperlipidemia, unspecified: Secondary | ICD-10-CM | POA: Diagnosis not present

## 2022-10-15 DIAGNOSIS — Z4803 Encounter for change or removal of drains: Secondary | ICD-10-CM | POA: Diagnosis not present

## 2022-10-15 DIAGNOSIS — K922 Gastrointestinal hemorrhage, unspecified: Secondary | ICD-10-CM | POA: Diagnosis not present

## 2022-10-15 DIAGNOSIS — E559 Vitamin D deficiency, unspecified: Secondary | ICD-10-CM | POA: Diagnosis not present

## 2022-10-15 DIAGNOSIS — D509 Iron deficiency anemia, unspecified: Secondary | ICD-10-CM | POA: Diagnosis not present

## 2022-10-15 DIAGNOSIS — Z9049 Acquired absence of other specified parts of digestive tract: Secondary | ICD-10-CM | POA: Diagnosis not present

## 2022-10-16 ENCOUNTER — Encounter: Payer: 59 | Admitting: Obstetrics and Gynecology

## 2022-10-16 DIAGNOSIS — J45909 Unspecified asthma, uncomplicated: Secondary | ICD-10-CM | POA: Diagnosis not present

## 2022-10-16 DIAGNOSIS — Z4803 Encounter for change or removal of drains: Secondary | ICD-10-CM | POA: Diagnosis not present

## 2022-10-16 DIAGNOSIS — K317 Polyp of stomach and duodenum: Secondary | ICD-10-CM | POA: Diagnosis not present

## 2022-10-16 DIAGNOSIS — E785 Hyperlipidemia, unspecified: Secondary | ICD-10-CM | POA: Diagnosis not present

## 2022-10-16 DIAGNOSIS — D509 Iron deficiency anemia, unspecified: Secondary | ICD-10-CM | POA: Diagnosis not present

## 2022-10-16 DIAGNOSIS — M1990 Unspecified osteoarthritis, unspecified site: Secondary | ICD-10-CM | POA: Diagnosis not present

## 2022-10-16 DIAGNOSIS — Z48815 Encounter for surgical aftercare following surgery on the digestive system: Secondary | ICD-10-CM | POA: Diagnosis not present

## 2022-10-16 DIAGNOSIS — K9189 Other postprocedural complications and disorders of digestive system: Secondary | ICD-10-CM | POA: Diagnosis not present

## 2022-10-16 DIAGNOSIS — Z9049 Acquired absence of other specified parts of digestive tract: Secondary | ICD-10-CM | POA: Diagnosis not present

## 2022-10-16 DIAGNOSIS — K921 Melena: Secondary | ICD-10-CM | POA: Diagnosis not present

## 2022-10-16 DIAGNOSIS — I10 Essential (primary) hypertension: Secondary | ICD-10-CM | POA: Diagnosis not present

## 2022-10-16 DIAGNOSIS — K922 Gastrointestinal hemorrhage, unspecified: Secondary | ICD-10-CM | POA: Diagnosis not present

## 2022-10-16 DIAGNOSIS — E559 Vitamin D deficiency, unspecified: Secondary | ICD-10-CM | POA: Diagnosis not present

## 2022-10-16 DIAGNOSIS — G4700 Insomnia, unspecified: Secondary | ICD-10-CM | POA: Diagnosis not present

## 2022-10-16 DIAGNOSIS — G5603 Carpal tunnel syndrome, bilateral upper limbs: Secondary | ICD-10-CM | POA: Diagnosis not present

## 2022-10-16 DIAGNOSIS — K219 Gastro-esophageal reflux disease without esophagitis: Secondary | ICD-10-CM | POA: Diagnosis not present

## 2022-10-17 DIAGNOSIS — K317 Polyp of stomach and duodenum: Secondary | ICD-10-CM | POA: Diagnosis not present

## 2022-10-18 ENCOUNTER — Telehealth: Payer: Self-pay | Admitting: *Deleted

## 2022-10-18 NOTE — Telephone Encounter (Signed)
Call from [place], [place] [facility] will go out to do OT evaluation for patient 10/20/2022.

## 2022-10-23 DIAGNOSIS — K922 Gastrointestinal hemorrhage, unspecified: Secondary | ICD-10-CM | POA: Diagnosis not present

## 2022-10-23 DIAGNOSIS — E559 Vitamin D deficiency, unspecified: Secondary | ICD-10-CM | POA: Diagnosis not present

## 2022-10-23 DIAGNOSIS — G5603 Carpal tunnel syndrome, bilateral upper limbs: Secondary | ICD-10-CM | POA: Diagnosis not present

## 2022-10-23 DIAGNOSIS — Z48815 Encounter for surgical aftercare following surgery on the digestive system: Secondary | ICD-10-CM | POA: Diagnosis not present

## 2022-10-23 DIAGNOSIS — K219 Gastro-esophageal reflux disease without esophagitis: Secondary | ICD-10-CM | POA: Diagnosis not present

## 2022-10-23 DIAGNOSIS — M1990 Unspecified osteoarthritis, unspecified site: Secondary | ICD-10-CM | POA: Diagnosis not present

## 2022-10-23 DIAGNOSIS — D509 Iron deficiency anemia, unspecified: Secondary | ICD-10-CM | POA: Diagnosis not present

## 2022-10-23 DIAGNOSIS — I10 Essential (primary) hypertension: Secondary | ICD-10-CM | POA: Diagnosis not present

## 2022-10-23 DIAGNOSIS — K921 Melena: Secondary | ICD-10-CM | POA: Diagnosis not present

## 2022-10-23 DIAGNOSIS — Z4803 Encounter for change or removal of drains: Secondary | ICD-10-CM | POA: Diagnosis not present

## 2022-10-23 DIAGNOSIS — E785 Hyperlipidemia, unspecified: Secondary | ICD-10-CM | POA: Diagnosis not present

## 2022-10-23 DIAGNOSIS — K317 Polyp of stomach and duodenum: Secondary | ICD-10-CM | POA: Diagnosis not present

## 2022-10-23 DIAGNOSIS — J45909 Unspecified asthma, uncomplicated: Secondary | ICD-10-CM | POA: Diagnosis not present

## 2022-10-23 DIAGNOSIS — G4700 Insomnia, unspecified: Secondary | ICD-10-CM | POA: Diagnosis not present

## 2022-10-23 DIAGNOSIS — K9189 Other postprocedural complications and disorders of digestive system: Secondary | ICD-10-CM | POA: Diagnosis not present

## 2022-10-23 DIAGNOSIS — Z9049 Acquired absence of other specified parts of digestive tract: Secondary | ICD-10-CM | POA: Diagnosis not present

## 2022-10-24 DIAGNOSIS — G5603 Carpal tunnel syndrome, bilateral upper limbs: Secondary | ICD-10-CM | POA: Diagnosis not present

## 2022-10-24 DIAGNOSIS — K9189 Other postprocedural complications and disorders of digestive system: Secondary | ICD-10-CM | POA: Diagnosis not present

## 2022-10-24 DIAGNOSIS — M1990 Unspecified osteoarthritis, unspecified site: Secondary | ICD-10-CM | POA: Diagnosis not present

## 2022-10-24 DIAGNOSIS — Z48815 Encounter for surgical aftercare following surgery on the digestive system: Secondary | ICD-10-CM | POA: Diagnosis not present

## 2022-10-24 DIAGNOSIS — Z9049 Acquired absence of other specified parts of digestive tract: Secondary | ICD-10-CM | POA: Diagnosis not present

## 2022-10-24 DIAGNOSIS — K921 Melena: Secondary | ICD-10-CM | POA: Diagnosis not present

## 2022-10-24 DIAGNOSIS — K219 Gastro-esophageal reflux disease without esophagitis: Secondary | ICD-10-CM | POA: Diagnosis not present

## 2022-10-24 DIAGNOSIS — Z4803 Encounter for change or removal of drains: Secondary | ICD-10-CM | POA: Diagnosis not present

## 2022-10-24 DIAGNOSIS — K317 Polyp of stomach and duodenum: Secondary | ICD-10-CM | POA: Diagnosis not present

## 2022-10-24 DIAGNOSIS — E785 Hyperlipidemia, unspecified: Secondary | ICD-10-CM | POA: Diagnosis not present

## 2022-10-24 DIAGNOSIS — G4700 Insomnia, unspecified: Secondary | ICD-10-CM | POA: Diagnosis not present

## 2022-10-24 DIAGNOSIS — D509 Iron deficiency anemia, unspecified: Secondary | ICD-10-CM | POA: Diagnosis not present

## 2022-10-24 DIAGNOSIS — E559 Vitamin D deficiency, unspecified: Secondary | ICD-10-CM | POA: Diagnosis not present

## 2022-10-24 DIAGNOSIS — I10 Essential (primary) hypertension: Secondary | ICD-10-CM | POA: Diagnosis not present

## 2022-10-24 DIAGNOSIS — K922 Gastrointestinal hemorrhage, unspecified: Secondary | ICD-10-CM | POA: Diagnosis not present

## 2022-10-24 DIAGNOSIS — J45909 Unspecified asthma, uncomplicated: Secondary | ICD-10-CM | POA: Diagnosis not present

## 2022-10-29 DIAGNOSIS — G4700 Insomnia, unspecified: Secondary | ICD-10-CM | POA: Diagnosis not present

## 2022-10-29 DIAGNOSIS — G5603 Carpal tunnel syndrome, bilateral upper limbs: Secondary | ICD-10-CM | POA: Diagnosis not present

## 2022-10-29 DIAGNOSIS — E559 Vitamin D deficiency, unspecified: Secondary | ICD-10-CM | POA: Diagnosis not present

## 2022-10-29 DIAGNOSIS — K219 Gastro-esophageal reflux disease without esophagitis: Secondary | ICD-10-CM | POA: Diagnosis not present

## 2022-10-29 DIAGNOSIS — K317 Polyp of stomach and duodenum: Secondary | ICD-10-CM | POA: Diagnosis not present

## 2022-10-29 DIAGNOSIS — E785 Hyperlipidemia, unspecified: Secondary | ICD-10-CM | POA: Diagnosis not present

## 2022-10-29 DIAGNOSIS — K921 Melena: Secondary | ICD-10-CM | POA: Diagnosis not present

## 2022-10-29 DIAGNOSIS — K922 Gastrointestinal hemorrhage, unspecified: Secondary | ICD-10-CM | POA: Diagnosis not present

## 2022-10-29 DIAGNOSIS — M1990 Unspecified osteoarthritis, unspecified site: Secondary | ICD-10-CM | POA: Diagnosis not present

## 2022-10-29 DIAGNOSIS — J45909 Unspecified asthma, uncomplicated: Secondary | ICD-10-CM | POA: Diagnosis not present

## 2022-10-29 DIAGNOSIS — I10 Essential (primary) hypertension: Secondary | ICD-10-CM | POA: Diagnosis not present

## 2022-10-29 DIAGNOSIS — D509 Iron deficiency anemia, unspecified: Secondary | ICD-10-CM | POA: Diagnosis not present

## 2022-10-29 DIAGNOSIS — Z4803 Encounter for change or removal of drains: Secondary | ICD-10-CM | POA: Diagnosis not present

## 2022-10-29 DIAGNOSIS — Z9049 Acquired absence of other specified parts of digestive tract: Secondary | ICD-10-CM | POA: Diagnosis not present

## 2022-10-29 DIAGNOSIS — K9189 Other postprocedural complications and disorders of digestive system: Secondary | ICD-10-CM | POA: Diagnosis not present

## 2022-10-29 DIAGNOSIS — Z48815 Encounter for surgical aftercare following surgery on the digestive system: Secondary | ICD-10-CM | POA: Diagnosis not present

## 2022-10-30 ENCOUNTER — Telehealth: Payer: Self-pay | Admitting: Orthopaedic Surgery

## 2022-10-30 NOTE — Telephone Encounter (Signed)
Please precert for left knee gel injection. Thanks!

## 2022-10-30 NOTE — Telephone Encounter (Signed)
Pt would like to request cortisone injection in left knee again last injection was Durolane in 02/2022, please advise

## 2022-11-01 NOTE — Telephone Encounter (Signed)
VOB submitted for Durolane, left knee.

## 2022-11-05 ENCOUNTER — Encounter: Payer: 59 | Admitting: Student

## 2022-11-05 DIAGNOSIS — K317 Polyp of stomach and duodenum: Secondary | ICD-10-CM | POA: Diagnosis not present

## 2022-11-05 DIAGNOSIS — Z79899 Other long term (current) drug therapy: Secondary | ICD-10-CM | POA: Diagnosis not present

## 2022-11-05 DIAGNOSIS — Z87891 Personal history of nicotine dependence: Secondary | ICD-10-CM | POA: Diagnosis not present

## 2022-11-06 DIAGNOSIS — K922 Gastrointestinal hemorrhage, unspecified: Secondary | ICD-10-CM | POA: Diagnosis not present

## 2022-11-06 DIAGNOSIS — E559 Vitamin D deficiency, unspecified: Secondary | ICD-10-CM | POA: Diagnosis not present

## 2022-11-06 DIAGNOSIS — Z48815 Encounter for surgical aftercare following surgery on the digestive system: Secondary | ICD-10-CM | POA: Diagnosis not present

## 2022-11-06 DIAGNOSIS — M1990 Unspecified osteoarthritis, unspecified site: Secondary | ICD-10-CM | POA: Diagnosis not present

## 2022-11-06 DIAGNOSIS — K219 Gastro-esophageal reflux disease without esophagitis: Secondary | ICD-10-CM | POA: Diagnosis not present

## 2022-11-06 DIAGNOSIS — J45909 Unspecified asthma, uncomplicated: Secondary | ICD-10-CM | POA: Diagnosis not present

## 2022-11-06 DIAGNOSIS — K921 Melena: Secondary | ICD-10-CM | POA: Diagnosis not present

## 2022-11-06 DIAGNOSIS — D509 Iron deficiency anemia, unspecified: Secondary | ICD-10-CM | POA: Diagnosis not present

## 2022-11-06 DIAGNOSIS — G4700 Insomnia, unspecified: Secondary | ICD-10-CM | POA: Diagnosis not present

## 2022-11-06 DIAGNOSIS — G5603 Carpal tunnel syndrome, bilateral upper limbs: Secondary | ICD-10-CM | POA: Diagnosis not present

## 2022-11-06 DIAGNOSIS — K9189 Other postprocedural complications and disorders of digestive system: Secondary | ICD-10-CM | POA: Diagnosis not present

## 2022-11-06 DIAGNOSIS — I10 Essential (primary) hypertension: Secondary | ICD-10-CM | POA: Diagnosis not present

## 2022-11-06 DIAGNOSIS — Z4803 Encounter for change or removal of drains: Secondary | ICD-10-CM | POA: Diagnosis not present

## 2022-11-06 DIAGNOSIS — K317 Polyp of stomach and duodenum: Secondary | ICD-10-CM | POA: Diagnosis not present

## 2022-11-06 DIAGNOSIS — Z9049 Acquired absence of other specified parts of digestive tract: Secondary | ICD-10-CM | POA: Diagnosis not present

## 2022-11-06 DIAGNOSIS — E785 Hyperlipidemia, unspecified: Secondary | ICD-10-CM | POA: Diagnosis not present

## 2022-11-07 ENCOUNTER — Other Ambulatory Visit: Payer: Self-pay

## 2022-11-07 DIAGNOSIS — M1712 Unilateral primary osteoarthritis, left knee: Secondary | ICD-10-CM

## 2022-11-12 ENCOUNTER — Ambulatory Visit (INDEPENDENT_AMBULATORY_CARE_PROVIDER_SITE_OTHER): Payer: 59 | Admitting: Physician Assistant

## 2022-11-12 DIAGNOSIS — M1712 Unilateral primary osteoarthritis, left knee: Secondary | ICD-10-CM

## 2022-11-12 MED ORDER — LIDOCAINE HCL 1 % IJ SOLN
2.0000 mL | INTRAMUSCULAR | Status: AC | PRN
Start: 2022-11-12 — End: 2022-11-12
  Administered 2022-11-12: 2 mL

## 2022-11-12 MED ORDER — BUPIVACAINE HCL 0.25 % IJ SOLN
2.0000 mL | INTRAMUSCULAR | Status: AC | PRN
Start: 2022-11-12 — End: 2022-11-12
  Administered 2022-11-12: 2 mL via INTRA_ARTICULAR

## 2022-11-12 MED ORDER — SODIUM HYALURONATE 60 MG/3ML IX PRSY
60.0000 mg | PREFILLED_SYRINGE | INTRA_ARTICULAR | Status: AC | PRN
Start: 2022-11-12 — End: 2022-11-12
  Administered 2022-11-12: 60 mg via INTRA_ARTICULAR

## 2022-11-12 NOTE — Progress Notes (Signed)
Office Visit Note   Patient: Olivia Ewing           Date of Birth: 11/07/1950           MRN: [id number] Visit Date: 11/12/2022              Requested by: Masters, [place], DO [address] [place],  [place] [address] PCP: Rudene Christians, DO   Assessment & Plan: Visit Diagnoses:  1. Unilateral primary osteoarthritis, left knee     Plan: Impression is left knee osteoarthritis.  Today, we aspirated the left knee and obtained 10 cc of fluid.  I then injected the left knee with Durolane.  She tolerated this well.  Follow-up as needed. Expiration date 03/13/2025  Follow-Up Instructions: Return if symptoms worsen or fail to improve.   Orders:  Orders Placed This Encounter  Procedures   Large Joint Inj: L knee   No orders of the defined types were placed in this encounter.     Procedures: Large Joint Inj: L knee on 11/12/2022 3:20 PM Indications: pain Details: 22 G needle, anterolateral approach Medications: 2 mL lidocaine 1 %; 2 mL bupivacaine 0.25 %; 60 mg Sodium Hyaluronate 60 MG/3ML      Clinical Data: No additional findings.   Subjective: Chief Complaint  Patient presents with   Left Knee - Pain    Durolane injection    HPI patient is a pleasant 72 year old female with underlying left knee osteoarthritis who comes in today for Durolane injection to the left knee.  She had viscosupplementation injection back in January and it helped until recently.     Objective: Vital Signs: There were no vitals taken for this visit.    Ortho Exam left knee exam shows moderate effusion.  Otherwise unchanged.  Specialty Comments:  No specialty comments available.  Imaging: No new imaging   PMFS History: Patient Active Problem List   Diagnosis Date Noted   Polyp of duodenum 04/03/2022   Nausea 01/29/2022   Change in bowel habits 01/29/2022   Primary osteoarthritis of left knee 11/29/2021   Trigger finger, right ring finger 04/10/2021   Trigger finger,  left ring finger 02/26/2021   Asthma 12/13/2020   GERD (gastroesophageal reflux disease) 12/13/2020   Housing instability 12/13/2020   Atypical squamous cells cannot exclude high grade squamous intraepithelial lesion on cytologic smear of cervix (ASC-H) 09/01/2020   Vaginal itching 09/01/2020   De Quervain's disease (tenosynovitis) 08/22/2020   Insomnia 06/08/2020   Medication monitoring encounter 02/25/2020   S/P arthroscopy of right shoulder 01/05/2020   Carpal tunnel syndrome, left upper limb 10/25/2019    Class: Chronic   Carpal tunnel syndrome, right upper limb 10/25/2019    Class: Chronic   Tendinopathy of right biceps tendon 07/27/2019   Breast cancer screening by mammogram 06/07/2019   Seasonal allergic rhinitis due to pollen 06/07/2019   Colon cancer screening 06/07/2019   Lumbar stenosis with neurogenic claudication 03/16/2019    Class: Chronic   Acute stress reaction 09/25/2018   Vitamin D insufficiency 11/25/2013   Healthcare maintenance 11/25/2013   Depression 09/23/2011   Functional incontinence 10/18/2010   Pain in right leg 10/18/2010   Iron deficiency anemia 05/12/2008   Hyperlipemia 06/18/2007   Essential hypertension 05/20/2007   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   Bipolar disorder (HCC)    Cervicalgia  Depression    Environmental allergies    cause SOB, uses inhaler for   Environmental and seasonal allergies    uses inhaler prn   Full dentures    GERD (gastroesophageal reflux disease)    diet controlled - no meds   Hyperlipidemia    diet controlled, no meds   Hypertension    Left knee DJD, degenerative meniscus tear 04/06/2012   Steroid injections: 09/2013 12/2013    Lumbar radiculopathy, chronic    Nontraumatic incomplete tear of right rotator cuff 07/27/2019   Primary osteoarthritis of right hip 04/27/2018    Right knee meniscal tear 03/14/2011   Right knee pain    posterior horn medial meniscal tear MRI 2013   Septic arthritis (HCC) 02/25/2020   Spinal stenosis    getting epidural injections -last one 06/12/2015   Status post lumbar laminectomy 03/16/2019   Status post total hip replacement, 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   SVD (spontaneous vaginal delivery)    x 4    Family History  Problem Relation Age of Onset   Hypertension Mother    Colon cancer Neg Hx    Esophageal cancer Neg Hx    Rectal cancer Neg Hx    Stomach cancer Neg Hx     Past Surgical History:  Procedure Laterality Date   APPENDECTOMY     Bladder tack  10/13/2006   cystocoele   BREAST SURGERY Right    benign cyst   CARPAL TUNNEL RELEASE Left 10/25/2019   Procedure: LEFT CARPAL TUNNEL RELEASE;  Surgeon: Kerrin Champagne, MD;  Location: [facility];  Service: Orthopedics;  Laterality: Left;   COLONOSCOPY     IRRIGATION AND DEBRIDEMENT SHOULDER Right 02/21/2020   Procedure: IRRIGATION AND DEBRIDEMENT RIGHT SHOULDER;  Surgeon: Tarry Kos, MD;  Location: [facility];  Service: Orthopedics;  Laterality: Right;   LUMBAR LAMINECTOMY N/A 03/16/2019   Procedure: CENTRAL LAMINECTOMIES L2-3, L3-4 AND L4-5;  Surgeon: Kerrin Champagne, MD;  Location: [facility];  Service: Orthopedics;  Laterality: N/A;   ROTATOR CUFF REPAIR     TOTAL HIP ARTHROPLASTY Right 04/27/2018   Procedure: RIGHT TOTAL HIP ARTHROPLASTY ANTERIOR APPROACH;  Surgeon: Tarry Kos, MD;  Location: [facility] OR;  Service: Orthopedics;  Laterality: Right;   TUBAL LIGATION     VAGINAL HYSTERECTOMY  2008   Social History   Occupational History   Not on file  Tobacco Use   Smoking status: Former    Current packs/day: 0.00    Types: Cigarettes    Quit date: 11/26/1983    Years since quitting: 38.9    Passive exposure: Never   Smokeless tobacco: Never  Vaping Use   Vaping status: Never Used  Substance and Sexual Activity   Alcohol use: Yes     Alcohol/week: 0.0 standard drinks of alcohol    Comment: rarely   Drug use: Not Currently    Types: Marijuana    Comment: last used 2019   Sexual activity: Yes    Birth control/protection: Surgical

## 2022-11-13 ENCOUNTER — Encounter: Payer: 59 | Admitting: Student

## 2022-11-13 NOTE — Progress Notes (Deleted)
72 year old female with HTN, recent duodenal resection with duodenojejunostomy, depression, iron deficiency anemia, and asthma A1c 5.5 on 08/19/2022 Admitted at [facility] 09/12/2022 - 10/09/2022  Olivia Ewing is a 72 y.o. female s/p robot assisted segmental duodenal resection with duodenojejunostomy and upper endoscopy on 09/12/22. Patient was NPO at the time, was correctly identified and marked. Appropriate surgical and anesthesia consent was obtained. IVF was started and patient was taken to the OR for their procedure on 09/12/2022. Patient tolerated the procedure well without any complications and was taken to the PACU in stable condition. The details of the procedure can be found in an operative note dictated on the day of procedure. Patient was transferred to the surgery service for postoperative care, including serial physical exams, pain control, and advancement of diet.   Post operatively, she had difficult to control pain. On 8/5 She was slower to respond to questions, and states pain is the cause of her demeanor. CT head normal, CT A/P noted 4cm hematoma.Her pain poorly managed with PO oxycodone, pain causing nausea and vomiting. On 8/9 she has increased pain, tahcycardia, and elevated WBC. Concern for anatstomtic leak or fluid collection. CT A/P showed area of possible anastomotic leak on 8/9. 8/10 IR did not find direct source of bleed. Started on IV antibiotics (cipro/flagyl) and antifungals (micafungin) with consultation of infectious disease. Transfused 1 unit 8/11 after which she remained hemodynamically stable. Repeat CT IV and upper Gi study 8/16 with improvement but still with some contrast extravasation at anastomosis site. Upper GI 8/20 inconclusive. While on limited diet, patient was supplemented with TPN via PICC. On 8/21 we started slowly advancing diet which she is tolerated well, TPN discontinued 8/26, caloric intake acceptable on FLD with protein shakes. Drain amylase level 3967, repeat CTAP  with oral and IV contrast 8/26 showed no evidence of leak at anastomosis site and interval resolution of previous fluid collection. She has continued to progress working with PT/OT.   The patient was ambulating with walker; voiding independently and subsequently deemed appropriate for discharge on 10/09/2022. We will leave JP drain in place at the time of discharge. She will be going home with daughter and home health. Patient is to follow up in clinic with [name] next week.   8/1: OR for segmental duodenal resection and jejunoduodenostomy, ON NG tube pulled out and advanced had some bloody output 8/2: Rapid response called during AM, patient was ambulating had some dizziness, and had syncope was then transferred to bed where she had syncopal episode again. Labs, vitals, and EKG were all stable. PCA was turned down to 0.2 and turned back up to 0.3 for intractable abdominal pain 8/3: continued difficulties with pain control 8/4: advanced to FLD 8/9: CT with leak , concerning for bleed, CTA no evidence of active bleed 8/10: 2 units pRBCs for Hgb 5.8 8/11: IR did not find a bleeding source, no embolization perofrmed, Hgb 6.9 (7.3), VSS, WBC 16.2 (18.3), continued melena, transfused 1 unit pRBC  8:12: Patient doing well, HGB stable. Continue NPO. Checked CBC in PM. Transitioned to PO cipro/flagyl and cont diflucan.  8/14 day had PICC line clot off and have evidence of candida infection. New PICC placed by VAT 8/20: upper GI poor quality study, but no obvious evidence of leak. 8/21: advanced to sips of clears, tolerated 8/22: advanced to CLD  8/23: adv to FLD  8/24: cont FLD through the weekend, dc abx, start calorie count  8/26: drain amylase elevated, rescan negative for leak/fluid collection improved  from prior 8/27: dispo planning, confirmation with daughter for pick up 8/28: PICC line removed and patient is stable for discharge with daughter coming to pick her up.    08/21/2022 Lumbar stenosis  with neurogenic claudication Chronic back pain that has worsened over last 2 months. She has difficulty ambulating and sleeping at times due to pain. She was seen Dr. Margaretha Sheffield 04/24 due to worsening of symptoms and MRI was ordered. This showed severe left  and moderate right foraminal stenosis that was progressed. She has follow-up with ortho on 07/16. She was given 10 tablets of hydrocodone at sports medicine visit 08/02/22 and has taken them when pain is worst.  P: Increase gabapentin to 600 mg TID 10 tablets of oxycodone-acetaminophen sent, talked with her that this would be one time medication to help her get to ortho appointment. She was encouraged to follow-up with ortho on 07/16 for possible steroid injection.   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 Berton Lan planning for repeat colpsocopy after surgery in July.    Overdue for mammogram

## 2022-11-28 DIAGNOSIS — K317 Polyp of stomach and duodenum: Secondary | ICD-10-CM | POA: Diagnosis not present

## 2022-11-28 DIAGNOSIS — E785 Hyperlipidemia, unspecified: Secondary | ICD-10-CM | POA: Diagnosis not present

## 2022-11-28 DIAGNOSIS — Z48815 Encounter for surgical aftercare following surgery on the digestive system: Secondary | ICD-10-CM | POA: Diagnosis not present

## 2022-11-28 DIAGNOSIS — G5603 Carpal tunnel syndrome, bilateral upper limbs: Secondary | ICD-10-CM | POA: Diagnosis not present

## 2022-11-28 DIAGNOSIS — K922 Gastrointestinal hemorrhage, unspecified: Secondary | ICD-10-CM | POA: Diagnosis not present

## 2022-11-28 DIAGNOSIS — K921 Melena: Secondary | ICD-10-CM | POA: Diagnosis not present

## 2022-11-28 DIAGNOSIS — I10 Essential (primary) hypertension: Secondary | ICD-10-CM | POA: Diagnosis not present

## 2022-11-28 DIAGNOSIS — M1990 Unspecified osteoarthritis, unspecified site: Secondary | ICD-10-CM | POA: Diagnosis not present

## 2022-11-28 DIAGNOSIS — Z4803 Encounter for change or removal of drains: Secondary | ICD-10-CM | POA: Diagnosis not present

## 2022-11-28 DIAGNOSIS — D509 Iron deficiency anemia, unspecified: Secondary | ICD-10-CM | POA: Diagnosis not present

## 2022-11-28 DIAGNOSIS — E559 Vitamin D deficiency, unspecified: Secondary | ICD-10-CM | POA: Diagnosis not present

## 2022-11-28 DIAGNOSIS — G4700 Insomnia, unspecified: Secondary | ICD-10-CM | POA: Diagnosis not present

## 2022-11-28 DIAGNOSIS — Z9049 Acquired absence of other specified parts of digestive tract: Secondary | ICD-10-CM | POA: Diagnosis not present

## 2022-11-28 DIAGNOSIS — K219 Gastro-esophageal reflux disease without esophagitis: Secondary | ICD-10-CM | POA: Diagnosis not present

## 2022-11-28 DIAGNOSIS — J45909 Unspecified asthma, uncomplicated: Secondary | ICD-10-CM | POA: Diagnosis not present

## 2022-11-28 DIAGNOSIS — K9189 Other postprocedural complications and disorders of digestive system: Secondary | ICD-10-CM | POA: Diagnosis not present

## 2022-12-03 ENCOUNTER — Encounter: Payer: 59 | Admitting: Internal Medicine

## 2022-12-05 DIAGNOSIS — G5603 Carpal tunnel syndrome, bilateral upper limbs: Secondary | ICD-10-CM | POA: Diagnosis not present

## 2022-12-05 DIAGNOSIS — K9189 Other postprocedural complications and disorders of digestive system: Secondary | ICD-10-CM | POA: Diagnosis not present

## 2022-12-05 DIAGNOSIS — K921 Melena: Secondary | ICD-10-CM | POA: Diagnosis not present

## 2022-12-05 DIAGNOSIS — E785 Hyperlipidemia, unspecified: Secondary | ICD-10-CM | POA: Diagnosis not present

## 2022-12-05 DIAGNOSIS — G4700 Insomnia, unspecified: Secondary | ICD-10-CM | POA: Diagnosis not present

## 2022-12-05 DIAGNOSIS — M1990 Unspecified osteoarthritis, unspecified site: Secondary | ICD-10-CM | POA: Diagnosis not present

## 2022-12-05 DIAGNOSIS — Z9049 Acquired absence of other specified parts of digestive tract: Secondary | ICD-10-CM | POA: Diagnosis not present

## 2022-12-05 DIAGNOSIS — Z48815 Encounter for surgical aftercare following surgery on the digestive system: Secondary | ICD-10-CM | POA: Diagnosis not present

## 2022-12-05 DIAGNOSIS — I10 Essential (primary) hypertension: Secondary | ICD-10-CM | POA: Diagnosis not present

## 2022-12-05 DIAGNOSIS — K317 Polyp of stomach and duodenum: Secondary | ICD-10-CM | POA: Diagnosis not present

## 2022-12-05 DIAGNOSIS — K219 Gastro-esophageal reflux disease without esophagitis: Secondary | ICD-10-CM | POA: Diagnosis not present

## 2022-12-05 DIAGNOSIS — Z4803 Encounter for change or removal of drains: Secondary | ICD-10-CM | POA: Diagnosis not present

## 2022-12-05 DIAGNOSIS — E559 Vitamin D deficiency, unspecified: Secondary | ICD-10-CM | POA: Diagnosis not present

## 2022-12-05 DIAGNOSIS — K922 Gastrointestinal hemorrhage, unspecified: Secondary | ICD-10-CM | POA: Diagnosis not present

## 2022-12-05 DIAGNOSIS — D509 Iron deficiency anemia, unspecified: Secondary | ICD-10-CM | POA: Diagnosis not present

## 2022-12-05 DIAGNOSIS — J45909 Unspecified asthma, uncomplicated: Secondary | ICD-10-CM | POA: Diagnosis not present

## 2022-12-12 ENCOUNTER — Ambulatory Visit: Payer: 59 | Admitting: Orthopedic Surgery

## 2022-12-14 ENCOUNTER — Other Ambulatory Visit: Payer: Self-pay | Admitting: Internal Medicine

## 2022-12-14 DIAGNOSIS — M48062 Spinal stenosis, lumbar region with neurogenic claudication: Secondary | ICD-10-CM

## 2022-12-16 NOTE — Telephone Encounter (Signed)
Next appt scheduled 1/17 with PCP. 

## 2022-12-17 ENCOUNTER — Telehealth: Payer: Self-pay

## 2022-12-17 NOTE — Telephone Encounter (Signed)
Sonya from [facility] called requesting a update on a order for occupational therapt. Please return Sonya's call [phone]

## 2022-12-17 NOTE — Telephone Encounter (Signed)
RTC to [facility] asking for paperwork faxed over for OT for patient. Hme, CMA that receives the paperwork looked.  No forms were found.  Asked Lamar Laundry to refax to [phone] for the Physician's signature.

## 2022-12-19 ENCOUNTER — Ambulatory Visit: Payer: 59

## 2022-12-19 ENCOUNTER — Ambulatory Visit: Payer: 59 | Admitting: Student

## 2022-12-19 ENCOUNTER — Encounter: Payer: Self-pay | Admitting: Internal Medicine

## 2022-12-19 VITALS — BP 135/89 | HR 72 | Temp 98.9°F | Ht 65.0 in | Wt 170.5 lb

## 2022-12-19 DIAGNOSIS — R87611 Atypical squamous cells cannot exclude high grade squamous intraepithelial lesion on cytologic smear of cervix (ASC-H): Secondary | ICD-10-CM

## 2022-12-19 DIAGNOSIS — Z90410 Acquired total absence of pancreas: Secondary | ICD-10-CM | POA: Diagnosis not present

## 2022-12-19 DIAGNOSIS — Z9049 Acquired absence of other specified parts of digestive tract: Secondary | ICD-10-CM | POA: Diagnosis not present

## 2022-12-19 DIAGNOSIS — M48062 Spinal stenosis, lumbar region with neurogenic claudication: Secondary | ICD-10-CM | POA: Diagnosis not present

## 2022-12-19 DIAGNOSIS — Z0000 Encounter for general adult medical examination without abnormal findings: Secondary | ICD-10-CM | POA: Diagnosis not present

## 2022-12-19 MED ORDER — GABAPENTIN 600 MG PO TABS: 600.0000 mg | ORAL_TABLET | Freq: Three times a day (TID) | ORAL | 11 refills | Status: DC

## 2022-12-19 NOTE — Progress Notes (Signed)
CC: Hospital follow-up  HPI:  Olivia Ewing is a 72 y.o. female living with a history stated below and presents today for hospital follow-up. Please see problem based assessment and plan for additional details.  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   Steroid injections: 09/2013 12/2013    Lumbar radiculopathy, chronic    Nontraumatic incomplete tear of right rotator cuff 07/27/2019   Primary osteoarthritis of right hip 04/27/2018   Right knee meniscal tear 03/14/2011   Right knee pain    posterior horn medial meniscal tear MRI 2013   Septic arthritis (HCC) 02/25/2020   Spinal stenosis    getting epidural injections -last one 06/12/2015   Status post lumbar laminectomy 03/16/2019   Status post total hip replacement, 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 (Patient not taking: Reported on 07/01/2022) 20 tablet 1   albuterol (VENTOLIN HFA) 108 (90 Base) MCG/ACT inhaler INHALE 1 TO 2 PUFFS INTO THE LUNGS EVERY 6 HOURS AS NEEDED FOR SHORTNESS OF BREATH 8.5 g 3   amLODipine (NORVASC) 5 MG tablet Take 1 tablet (5 mg total) by mouth daily. 90 tablet 2   COD LIVER OIL PO Take by mouth.     famotidine (PEPCID) 20 MG tablet TAKE 1 TABLET BY MOUTH  TWICE A DAY 60 tablet 2   ferrous gluconate (FERGON) 324 MG tablet Take 1 tablet (324 mg total) by mouth daily with breakfast. 60 tablet 1   fluticasone (FLONASE) 50 MCG/ACT nasal spray USE 1 OR 2 SPRAYS IN EACH NOSTRIL DAILY 16 g 3   fluticasone (FLOVENT HFA) 44 MCG/ACT inhaler Inhale 1 puff into the lungs 2 (two) times daily. 1 each 2   HYDROcodone-acetaminophen (NORCO) 7.5-325 MG tablet Take 1 tablet by mouth every 8 (eight) hours as needed for moderate pain. 10 tablet 0   ibuprofen (ADVIL) 800 MG tablet Take 1 tablet (800 mg total) by mouth every 8 (eight) hours as needed. (Patient not taking: Reported on 07/01/2022) 12 tablet 0   lisinopril-hydrochlorothiazide (ZESTORETIC) 20-12.5 MG tablet Take 1 tablet by mouth daily. 90 tablet 2   loratadine (CLARITIN) 10 MG tablet Take 1 tablet (10 mg total) by mouth daily. 30 tablet 0   metoCLOPramide (REGLAN) 5 MG tablet Take 1 tablet (5 mg total) by mouth 3 (three) times daily as needed for nausea. (Patient not taking: Reported on 07/01/2022) 90 tablet 1   Multiple Vitamin (MULTIVITAMIN WITH MINERALS) TABS tablet Take 1 tablet by mouth daily.     omeprazole (PRILOSEC) 40 MG capsule Take 1 capsule (40 mg total) by mouth in the morning and at bedtime. 60 capsule 3  promethazine (PHENERGAN) 25 MG tablet Take 1 tablet (25 mg total) by mouth every 8 (eight) hours as needed. 30 tablet 0   QUEtiapine (SEROQUEL) 100 MG tablet TAKE 1 TABLET BY MOUTH EVERY NIGHT AT BEDTIME 90 tablet 3   rosuvastatin (CRESTOR) 20 MG tablet Take 1 tablet (20 mg total) by mouth daily. 90 tablet 1   sertraline (ZOLOFT) 100 MG tablet Take 2 tablets (200 mg total) by mouth daily. 180 tablet 3   sodium chloride (OCEAN) 0.65 % SOLN nasal spray Place 1 spray into both nostrils 2 (two) times daily as needed. 30 mL 0   sucralfate (CARAFATE) 1 GM/10ML suspension Take 10 mLs (1 g total) by mouth every 6 (six) hours as needed. 420 mL 1   No current facility-administered medications on file  prior to visit.    Family History  Problem Relation Age of Onset   Hypertension Mother    Colon cancer Neg Hx    Esophageal cancer Neg Hx    Rectal cancer Neg Hx    Stomach cancer Neg Hx     Social History   Socioeconomic History   Marital status: Divorced    Spouse name: Not on file   Number of children: 4   Years of education: Not on file   Highest education level: Not on file  Occupational History   Not on file  Tobacco Use   Smoking status: Former    Current packs/day: 0.00    Types: Cigarettes    Quit date: 11/26/1983    Years since quitting: 39.0    Passive exposure: Never   Smokeless tobacco: Never  Vaping Use   Vaping status: Never Used  Substance and Sexual Activity   Alcohol use: Yes    Alcohol/week: 0.0 standard drinks of alcohol    Comment: rarely   Drug use: Not Currently    Types: Marijuana    Comment: last used 2019   Sexual activity: Yes    Birth control/protection: Surgical  Other Topics Concern   Not on file  Social History Narrative   Single. 9 siblings. 1 brother with HIV, 1 sister with hypertension. 4 children, 1 daughter with HTN and depression, 1 son with HTN.   [phone]- shelter's number.    Former smoker, no alcohol or drug use.   Social Determinants of Health   Financial Resource Strain: Low Risk  (12/12/2021)   Overall Financial Resource Strain (CARDIA)    Difficulty of Paying Living Expenses: Not hard at all  Food Insecurity: No Food Insecurity (10/10/2022)   Hunger Vital Sign    Worried About Running Out of Food in the Last Year: Never true    Ran Out of Food in the Last Year: Never true  Transportation Needs: No Transportation Needs (10/10/2022)   PRAPARE - Administrator, Civil Service (Medical): No    Lack of Transportation (Non-Medical): No  Physical Activity: Sufficiently Active (12/12/2021)   Exercise Vital Sign    Days of Exercise per Week: 7 days    Minutes of Exercise per Session: 30 min  Stress: Stress  Concern Present (12/12/2021)   [employer] of Occupational Health - Occupational Stress Questionnaire    Feeling of Stress : Very much  Social Connections: Moderately Integrated (12/12/2021)   Social Connection and Isolation Panel [NHANES]    Frequency of Communication with Friends and Family: More than three times a week    Frequency of Social Gatherings with Friends and Family: More than three times a  week    Attends Religious Services: 1 to 4 times per year    Active Member of Clubs or Organizations: No    Attends Banker Meetings: 1 to 4 times per year    Marital Status: Never married  Intimate Partner Violence: Not At Risk (12/12/2021)   Humiliation, Afraid, Rape, and Kick questionnaire    Fear of Current or Ex-Partner: No    Emotionally Abused: No    Physically Abused: No    Sexually Abused: No    Review of Systems: ROS negative except for what is noted on the assessment and plan.  Vitals:   12/19/22 0906  BP: 135/89  Pulse: 72  Temp: 98.9 F (37.2 C)  TempSrc: Oral  SpO2: 100%  Weight: 170 lb 8 oz (77.3 kg)  Height: 5\' 5"  (1.651 m)    Physical Exam: Constitutional: well-appearing female in no acute distress HENT: normocephalic atraumatic, mucous membranes moist Eyes: conjunctiva non-erythematous Neck: supple Cardiovascular: regular rate and rhythm, no m/r/g Pulmonary/Chest: normal work of breathing on room air, lungs clear to auscultation bilaterally Abdominal: soft, non-tender, non-distended, surgical scars present   Assessment & Plan:   Atypical squamous cells cannot exclude high grade squamous intraepithelial lesion on cytologic smear of cervix (ASC-H) Given number for her gynecologist, Dr. Roma Kayser which she states she will make an appointment for.  H/O Whipple procedure Patient recently underwent Whipple procedure in August for a large duodenal polyp.  This was complicated by an anastomotic leak, which was subsequently repaired.  Since her  procedure, she states that she has been doing well.  She is able to eat small meals, drink water, and perform all of her ADLs and IADLs independently.  She has been having normal bowel movements with no signs of blood or darkening of stools.  Overall, patient has been doing well.  Will need to recheck CBC at her next appointment to ensure hemoglobin is not dropping.  Reassuring patient is not complaining of any bleeding.  Lumbar stenosis with neurogenic claudication Patient has been taking gabapentin 600 mg 3 times daily for this, she states she has been doing well with this regimen.  Will refill.  Patient discussed with Dr. Lanelle Bal Christorpher Hisaw, M.D. [facility] Internal Medicine, PGY-2 Pager: [phone] Date 12/19/2022 Time 10:13 AM

## 2022-12-19 NOTE — Assessment & Plan Note (Signed)
Given number for her gynecologist, Dr. Roma Kayser which she states she will make an appointment for.

## 2022-12-19 NOTE — Patient Instructions (Signed)
Thank you so much for coming to the clinic today!   I'm really happy to see you doing so well! Keep doing what you're doing! I have refilled your gabapentin!   If you have any questions please feel free to the call the clinic at anytime at [phone]. It was a pleasure seeing you!  Best, Dr. Thomasene Ripple

## 2022-12-19 NOTE — Assessment & Plan Note (Signed)
Patient has been taking gabapentin 600 mg 3 times daily for this, she states she has been doing well with this regimen.  Will refill.

## 2022-12-19 NOTE — Assessment & Plan Note (Signed)
Patient recently underwent Whipple procedure in August for a large duodenal polyp.  This was complicated by an anastomotic leak, which was subsequently repaired.  Since her procedure, she states that she has been doing well.  She is able to eat small meals, drink water, and perform all of her ADLs and IADLs independently.  She has been having normal bowel movements with no signs of blood or darkening of stools.  Overall, patient has been doing well.  Will need to recheck CBC at her next appointment to ensure hemoglobin is not dropping.  Reassuring patient is not complaining of any bleeding.

## 2022-12-19 NOTE — Progress Notes (Signed)
Subjective:   Olivia Ewing is a 72 y.o. female who presents for Medicare Annual (Subsequent) preventive examination.  Visit Complete: In person  Patient Medicare AWV questionnaire was completed by the patient on 12/19/2022; I have confirmed that all information answered by patient is correct and no changes since this date.        Objective:    Today's Vitals   12/19/22 1045  BP: 135/89  Pulse: 72  Temp: 98.9 F (37.2 C)  TempSrc: Oral  SpO2: 100%  Weight: 170 lb 8 oz (77.3 kg)  Height: 5\' 5"  (1.651 m)   Body mass index is 28.37 kg/m.     12/19/2022   10:46 AM 12/19/2022    9:07 AM 08/21/2022    1:29 PM 12/12/2021    3:13 PM 05/08/2021    9:53 AM 09/01/2020    9:40 AM 07/24/2020   10:33 AM  Advanced Directives  Does Patient Have a Medical Advance Directive? No No No No No No No  Would patient like information on creating a medical advance directive? No - Patient declined No - Patient declined No - Patient declined No - Patient declined Yes (MAU/Ambulatory/Procedural Areas - Information given) Yes (ED - Information included in AVS) No - Patient declined    Current Medications (verified) Outpatient Encounter Medications as of 12/19/2022  Medication Sig   acetaminophen-codeine (TYLENOL #3) 300-30 MG tablet Take 1 tablet by mouth 2 (two) times daily as needed for moderate pain. (Patient not taking: Reported on 07/01/2022)   albuterol (VENTOLIN HFA) 108 (90 Base) MCG/ACT inhaler INHALE 1 TO 2 PUFFS INTO THE LUNGS EVERY 6 HOURS AS NEEDED FOR SHORTNESS OF BREATH   amLODipine (NORVASC) 5 MG tablet Take 1 tablet (5 mg total) by mouth daily.   COD LIVER OIL PO Take by mouth.   famotidine (PEPCID) 20 MG tablet TAKE 1 TABLET BY MOUTH TWICE A DAY   ferrous gluconate (FERGON) 324 MG tablet Take 1 tablet (324 mg total) by mouth daily with breakfast.   fluticasone (FLONASE) 50 MCG/ACT nasal spray USE 1 OR 2 SPRAYS IN EACH NOSTRIL DAILY   fluticasone (FLOVENT HFA) 44 MCG/ACT inhaler  Inhale 1 puff into the lungs 2 (two) times daily.   gabapentin (NEURONTIN) 600 MG tablet Take 1 tablet (600 mg total) by mouth 3 (three) times daily.   HYDROcodone-acetaminophen (NORCO) 7.5-325 MG tablet Take 1 tablet by mouth every 8 (eight) hours as needed for moderate pain.   ibuprofen (ADVIL) 800 MG tablet Take 1 tablet (800 mg total) by mouth every 8 (eight) hours as needed. (Patient not taking: Reported on 07/01/2022)   lisinopril-hydrochlorothiazide (ZESTORETIC) 20-12.5 MG tablet Take 1 tablet by mouth daily.   loratadine (CLARITIN) 10 MG tablet Take 1 tablet (10 mg total) by mouth daily.   metoCLOPramide (REGLAN) 5 MG tablet Take 1 tablet (5 mg total) by mouth 3 (three) times daily as needed for nausea. (Patient not taking: Reported on 07/01/2022)   Multiple Vitamin (MULTIVITAMIN WITH MINERALS) TABS tablet Take 1 tablet by mouth daily.   omeprazole (PRILOSEC) 40 MG capsule Take 1 capsule (40 mg total) by mouth in the morning and at bedtime.   promethazine (PHENERGAN) 25 MG tablet Take 1 tablet (25 mg total) by mouth every 8 (eight) hours as needed.   QUEtiapine (SEROQUEL) 100 MG tablet TAKE 1 TABLET BY MOUTH EVERY NIGHT AT BEDTIME   rosuvastatin (CRESTOR) 20 MG tablet Take 1 tablet (20 mg total) by mouth daily.   sertraline (ZOLOFT)  100 MG tablet Take 2 tablets (200 mg total) by mouth daily.   sodium chloride (OCEAN) 0.65 % SOLN nasal spray Place 1 spray into both nostrils 2 (two) times daily as needed.   sucralfate (CARAFATE) 1 GM/10ML suspension Take 10 mLs (1 g total) by mouth every 6 (six) hours as needed.   No facility-administered encounter medications on file as of 12/19/2022.    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   Steroid injections: 09/2013 12/2013    Lumbar radiculopathy, chronic    Nontraumatic incomplete tear of right rotator cuff 07/27/2019   Primary osteoarthritis of right hip 04/27/2018   Right knee meniscal tear 03/14/2011   Right knee pain    posterior horn medial meniscal tear MRI 2013   Septic arthritis (HCC) 02/25/2020   Spinal stenosis    getting epidural injections -last one 06/12/2015   Status post lumbar laminectomy 03/16/2019   Status post total hip replacement, 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   SVD (spontaneous vaginal delivery)    x 4   Past Surgical History:  Procedure Laterality Date   APPENDECTOMY     Bladder tack  10/13/2006   cystocoele   BREAST SURGERY Right    benign cyst   CARPAL TUNNEL RELEASE Left 10/25/2019   Procedure: LEFT CARPAL TUNNEL RELEASE;  Surgeon: Kerrin Champagne, MD;  Location: [facility];  Service: Orthopedics;  Laterality: Left;   COLONOSCOPY     IRRIGATION AND DEBRIDEMENT SHOULDER Right 02/21/2020   Procedure: IRRIGATION AND DEBRIDEMENT RIGHT SHOULDER;  Surgeon: Tarry Kos, MD;  Location:  SURGERY CENTER;  Service: Orthopedics;  Laterality: Right;   LUMBAR LAMINECTOMY N/A 03/16/2019   Procedure: CENTRAL LAMINECTOMIES L2-3, L3-4 AND L4-5;  Surgeon: Kerrin Champagne, MD;  Location: [facility] OR;  Service: Orthopedics;  Laterality: N/A;   ROTATOR CUFF REPAIR     TOTAL HIP ARTHROPLASTY Right 04/27/2018   Procedure: RIGHT TOTAL HIP ARTHROPLASTY ANTERIOR APPROACH;  Surgeon: Tarry Kos, MD;  Location: [facility] OR;  Service: Orthopedics;  Laterality: Right;   TUBAL LIGATION     VAGINAL HYSTERECTOMY  2008   Family History  Problem Relation Age of Onset    Hypertension Mother    Colon cancer Neg Hx    Esophageal cancer Neg Hx    Rectal cancer Neg Hx    Stomach cancer Neg Hx    Social History   Socioeconomic History   Marital status: Divorced    Spouse name: Not on file   Number of children: 4   Years of education: Not on file   Highest education level: Not on file  Occupational History   Not on file  Tobacco Use   Smoking status: Former    Current packs/day: 0.00    Types: Cigarettes    Quit date: 11/26/1983    Years since quitting: 46.0  Passive exposure: Never   Smokeless tobacco: Never  Vaping Use   Vaping status: Never Used  Substance and Sexual Activity   Alcohol use: Yes    Alcohol/week: 0.0 standard drinks of alcohol    Comment: rarely   Drug use: Not Currently    Types: Marijuana    Comment: last used 2019   Sexual activity: Yes    Birth control/protection: Surgical  Other Topics Concern   Not on file  Social History Narrative   Single. 9 siblings. 1 brother with HIV, 1 sister with hypertension. 4 children, 1 daughter with HTN and depression, 1 son with HTN.   [phone]- shelter's number.    Former smoker, no alcohol or drug use.   Social Determinants of Health   Financial Resource Strain: Medium Risk (12/19/2022)   Overall Financial Resource Strain (CARDIA)    Difficulty of Paying Living Expenses: Somewhat hard  Food Insecurity: Food Insecurity Present (12/19/2022)   Hunger Vital Sign    Worried About Running Out of Food in the Last Year: Sometimes true    Ran Out of Food in the Last Year: Never true  Transportation Needs: No Transportation Needs (12/19/2022)   PRAPARE - Administrator, Civil Service (Medical): No    Lack of Transportation (Non-Medical): No  Physical Activity: Sufficiently Active (12/19/2022)   Exercise Vital Sign    Days of Exercise per Week: 7 days    Minutes of Exercise per Session: 30 min  Stress: Stress Concern Present (12/19/2022)   [employer] of Occupational  Health - Occupational Stress Questionnaire    Feeling of Stress : Rather much  Social Connections: Unknown (12/19/2022)   Social Connection and Isolation Panel [NHANES]    Frequency of Communication with Friends and Family: More than three times a week    Frequency of Social Gatherings with Friends and Family: Patient declined    Attends Religious Services: Patient declined    Database administrator or Organizations: No    Attends Engineer, structural: Never    Marital Status: Divorced    Tobacco Counseling Counseling given: Not Answered   Clinical Intake:  Pre-visit preparation completed: Yes  Pain : No/denies pain     Nutritional Risks: None Diabetes: No  How often do you need to have someone help you when you read instructions, pamphlets, or other written materials from your doctor or pharmacy?: 1 - Never What is the last grade level you completed in school?: 12th grade  Interpreter Needed?: No  Information entered by :: Danessa Mensch,cma   Activities of Daily Living    12/19/2022   10:46 AM 12/19/2022    9:07 AM  In your present state of health, do you have any difficulty performing the following activities:  Hearing? 0 0  Vision? 1 1  Comment  a times  Difficulty concentrating or making decisions? 0 0  Walking or climbing stairs? 0 0  Dressing or bathing? 0 0  Doing errands, shopping? 0 0    Patient Care Team: Masters, Florentina Addison, DO as PCP - General  Indicate any recent Medical Services you may have received from other than [facility] providers in the past year (date may be approximate).     Assessment:   This is a routine wellness examination for Snigdha.  Hearing/Vision screen No results found.   Goals Addressed   None   Depression Screen    12/19/2022   10:47 AM 08/21/2022    2:20 PM 12/12/2021  3:12 PM 12/12/2021    1:28 PM 05/08/2021    9:54 AM 12/13/2020    4:47 PM 09/01/2020    9:38 AM  PHQ 2/9 Scores  PHQ - 2 Score 3 2 5 4  0 0 2  PHQ- 9  Score 4 7 17 17   0 17    Fall Risk    12/19/2022   10:46 AM 12/19/2022    9:06 AM 08/21/2022    1:25 PM 12/12/2021    3:13 PM 12/12/2021    1:25 PM  Fall Risk   Falls in the past year? 0 0 0 0 0  Number falls in past yr: 0 0  0 0  Injury with Fall? 0 0  0 0  Risk for fall due to : No Fall Risks No Fall Risks No Fall Risks No Fall Risks No Fall Risks  Follow up Falls evaluation completed;Falls prevention discussed Falls evaluation completed;Falls prevention discussed Falls evaluation completed Falls evaluation completed;Falls prevention discussed Falls evaluation completed;Falls prevention discussed    MEDICARE RISK AT HOME: Medicare Risk at Home Any stairs in or around the home?: Yes If so, are there any without handrails?: No Home free of loose throw rugs in walkways, pet beds, electrical cords, etc?: No Adequate lighting in your home to reduce risk of falls?: Yes Life alert?: Yes Use of a cane, walker or w/c?: No Grab bars in the bathroom?: No Shower chair or bench in shower?: No Elevated toilet seat or a handicapped toilet?: No  TIMED UP AND GO:  Was the test performed?  No    Cognitive Function:        12/19/2022   10:47 AM 12/12/2021    3:14 PM  6CIT Screen  What Year? 0 points 0 points  What month? 0 points 0 points  What time? 0 points 0 points  Count back from 20 0 points 0 points  Months in reverse 0 points 0 points  Repeat phrase 0 points 0 points  Total Score 0 points 0 points    Immunizations Immunization History  Administered Date(s) Administered   Fluad Quad(high Dose 65+) 10/31/2020, 12/12/2021   Influenza Whole 11/14/2008, 10/18/2010   Influenza,inj,Quad PF,6+ Mos 05/02/2015, 01/15/2018, 01/27/2019   Pneumococcal Conjugate-13 01/15/2018   Pneumococcal Polysaccharide-23 07/24/2020   Td 08/19/2008    TDAP status: Due, Education has been provided regarding the importance of this vaccine. Advised may receive this vaccine at local pharmacy or Health  Dept. Aware to provide a copy of the vaccination record if obtained from local pharmacy or Health Dept. Verbalized acceptance and understanding.  Flu Vaccine status: Due, Education has been provided regarding the importance of this vaccine. Advised may receive this vaccine at local pharmacy or Health Dept. Aware to provide a copy of the vaccination record if obtained from local pharmacy or Health Dept. Verbalized acceptance and understanding.  Pneumococcal vaccine status: Up to date  Covid-19 vaccine status: Information provided on how to obtain vaccines.   Qualifies for Shingles Vaccine? No   Zostavax completed No   Shingrix Completed?: No.    Education has been provided regarding the importance of this vaccine. Patient has been advised to call insurance company to determine out of pocket expense if they have not yet received this vaccine. Advised may also receive vaccine at local pharmacy or Health Dept. Verbalized acceptance and understanding.  Screening Tests Health Maintenance  Topic Date Due   COVID-19 Vaccine (1) Never done   Zoster Vaccines- Shingrix (1 of 2) Never  done   MAMMOGRAM  01/10/2013   DTaP/Tdap/Td (2 - Tdap) 08/20/2018   INFLUENZA VACCINE  09/12/2022   Medicare Annual Wellness (AWV)  12/19/2023   Colonoscopy  03/22/2025   Pneumonia Vaccine 27+ Years old  Completed   DEXA SCAN  Completed   Hepatitis C Screening  Completed   HPV VACCINES  Aged Out    Health Maintenance  Health Maintenance Due  Topic Date Due   COVID-19 Vaccine (1) Never done   Zoster Vaccines- Shingrix (1 of 2) Never done   MAMMOGRAM  01/10/2013   DTaP/Tdap/Td (2 - Tdap) 08/20/2018   INFLUENZA VACCINE  09/12/2022    Colorectal cancer screening: Type of screening: Colonoscopy. Completed 03/22/2022. Repeat every 3 years    Lung Cancer Screening: (Low Dose CT Chest recommended if Age 43-80 years, 20 pack-year currently smoking OR have quit w/in 15years.) does not qualify.   Lung Cancer  Screening Referral: N/A  Additional Screening:  Hepatitis C Screening: does not qualify; Completed 07/17/2022  Vision Screening: Recommended annual ophthalmology exams for early detection of glaucoma and other disorders of the eye. Is the patient up to date with their annual eye exam?  Yes  Who is the provider or what is the name of the office in which the patient attends annual eye exams? [facility] If pt is not established with a provider, would they like to be referred to a provider to establish care? No .   Dental Screening: Recommended annual dental exams for proper oral hygiene  Community Resource Referral / Chronic Care Management: CRR required this visit?  No   CCM required this visit?  No     Plan:     I have personally reviewed and noted the following in the patient's chart:   Medical and social history Use of alcohol, tobacco or illicit drugs  Current medications and supplements including opioid prescriptions. Patient is currently taking opioid prescriptions. Information provided to patient regarding non-opioid alternatives. Patient advised to discuss non-opioid treatment plan with their provider. Functional ability and status Nutritional status Physical activity Advanced directives List of other physicians Hospitalizations, surgeries, and ER visits in previous 12 months Vitals Screenings to include cognitive, depression, and falls Referrals and appointments  In addition, I have reviewed and discussed with patient certain preventive protocols, quality metrics, and best practice recommendations. A written personalized care plan for preventive services as well as general preventive health recommendations were provided to patient.     Cala Bradford, CMA   12/19/2022   After Visit Summary: (MyChart) Due to this being a telephonic visit, the after visit summary with patients personalized plan was offered to patient via MyChart   Nurse Notes: Face-To-Face  Visit  Ms. Clovis Riley , Thank you for taking time to come for your Medicare Wellness Visit. I appreciate your ongoing commitment to your health goals. Please review the following plan we discussed and let me know if I can assist you in the future.   These are the goals we discussed:  Goals       Care Coordination Activities- Therapy / Housing (pt-stated)      Care Coordination Interventions: Discussed plans with patient for ongoing care management follow up and provided patient with direct contact information for care management team Screening for signs and symptoms of depression related to chronic disease state   Motivational Interviewing employed Depression screen reviewed  Solution-Focused Strategies employed:  Mindfulness or Relaxation training provided Active listening / Reflection utilized  Industrial/product designer Provided Participation in  counseling encouraged  Suicidal Ideation/Homicidal Ideation assessed: Patient denied wanting to harm self or others. Discussed self-care action plan: 10 minutes of practicing mindfulness daily focusing on positive occurrences  Discussed housing. Patient is working with Housing authoriuty and will be placed on housing list one balance is paid.          This is a list of the screening recommended for you and due dates:  Health Maintenance  Topic Date Due   COVID-19 Vaccine (1) Never done   Zoster (Shingles) Vaccine (1 of 2) Never done   Mammogram  01/10/2013   DTaP/Tdap/Td vaccine (2 - Tdap) 08/20/2018   Flu Shot  09/12/2022   Medicare Annual Wellness Visit  12/19/2023   Colon Cancer Screening  03/22/2025   Pneumonia Vaccine  Completed   DEXA scan (bone density measurement)  Completed   Hepatitis C Screening  Completed   HPV Vaccine  Aged Out

## 2022-12-19 NOTE — Progress Notes (Signed)
[facility] Medicine Clinic Attending  Case discussed with the resident at the time of the visit.  We reviewed the resident's history and exam and pertinent patient test results.  I agree with the assessment, diagnosis, and plan of care documented in the resident's note.  Debe Coder, MD

## 2022-12-31 ENCOUNTER — Other Ambulatory Visit: Payer: Self-pay

## 2022-12-31 ENCOUNTER — Other Ambulatory Visit (HOSPITAL_COMMUNITY): Payer: Self-pay

## 2022-12-31 ENCOUNTER — Encounter: Payer: Self-pay | Admitting: Family Medicine

## 2022-12-31 ENCOUNTER — Ambulatory Visit (INDEPENDENT_AMBULATORY_CARE_PROVIDER_SITE_OTHER): Payer: 59 | Admitting: Family Medicine

## 2022-12-31 VITALS — BP 148/97 | Ht 65.0 in | Wt 170.0 lb

## 2022-12-31 DIAGNOSIS — M1712 Unilateral primary osteoarthritis, left knee: Secondary | ICD-10-CM

## 2022-12-31 DIAGNOSIS — M48062 Spinal stenosis, lumbar region with neurogenic claudication: Secondary | ICD-10-CM

## 2022-12-31 DIAGNOSIS — G8929 Other chronic pain: Secondary | ICD-10-CM | POA: Diagnosis not present

## 2022-12-31 DIAGNOSIS — M25562 Pain in left knee: Secondary | ICD-10-CM

## 2022-12-31 DIAGNOSIS — E782 Mixed hyperlipidemia: Secondary | ICD-10-CM

## 2022-12-31 DIAGNOSIS — M5416 Radiculopathy, lumbar region: Secondary | ICD-10-CM

## 2022-12-31 MED ORDER — QUETIAPINE FUMARATE 100 MG PO TABS: 100.0000 mg | ORAL_TABLET | Freq: Every day | ORAL | 3 refills | Status: DC

## 2022-12-31 MED ORDER — IBUPROFEN 800 MG PO TABS: 800.0000 mg | ORAL_TABLET | Freq: Three times a day (TID) | ORAL | 0 refills | Status: DC | PRN

## 2022-12-31 MED ORDER — METHYLPREDNISOLONE ACETATE 40 MG/ML IJ SUSP
40.0000 mg | Freq: Once | INTRAMUSCULAR | Status: AC
  Administered 2022-12-31: 40 mg via INTRA_ARTICULAR

## 2022-12-31 MED ORDER — ROSUVASTATIN CALCIUM 20 MG PO TABS: 20.0000 mg | ORAL_TABLET | Freq: Every day | ORAL | 3 refills | Status: DC

## 2022-12-31 MED ORDER — HYDROCODONE-ACETAMINOPHEN 7.5-325 MG PO TABS
1.0000 | ORAL_TABLET | Freq: Three times a day (TID) | ORAL | 0 refills | Status: DC | PRN
  Filled 2022-12-31: qty 10, 4d supply, fill #0

## 2022-12-31 NOTE — Progress Notes (Incomplete)
PCP: Rudene Christians, DO  Chief Complaint: left knee pain Subjective:   HPI: Patient is a 72 y.o. female here for left knee pain. Patient presents with swollen let knee and pain that has been present for about 1 week. Patient states she has had knee pain in the past and has required aspirations. Patient states that previous physician used to give her oral steroids, toradol and some pain medication until it got better. Patient notes that she has been having to use a cane due to the pain. Of note patient had a whipple's procedure in August of this year and has improved.  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   Steroid injections: 09/2013 12/2013    Lumbar radiculopathy, chronic    Nontraumatic incomplete tear of right rotator cuff 07/27/2019   Primary osteoarthritis of right hip 04/27/2018   Right knee meniscal tear 03/14/2011   Right knee pain    posterior horn medial meniscal tear MRI 2013   Septic arthritis (HCC) 02/25/2020   Spinal stenosis    getting epidural injections -last one 06/12/2015   Status post lumbar laminectomy 03/16/2019   Status post total hip replacement, 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 (Patient not taking: Reported on  07/01/2022) 20 tablet 1   albuterol (VENTOLIN HFA) 108 (90 Base) MCG/ACT inhaler INHALE 1 TO 2 PUFFS INTO THE LUNGS EVERY 6 HOURS AS NEEDED FOR SHORTNESS OF BREATH 8.5 g 3   amLODipine (NORVASC) 5 MG tablet Take 1 tablet (5 mg total) by mouth daily. 90 tablet 2   COD LIVER OIL PO Take by mouth.     famotidine (PEPCID) 20 MG tablet TAKE 1 TABLET BY MOUTH TWICE A DAY 60 tablet 2   ferrous gluconate (FERGON) 324 MG tablet Take 1 tablet (324 mg total) by mouth daily with breakfast. 60 tablet 1   fluticasone (FLONASE) 50 MCG/ACT nasal spray USE 1 OR 2 SPRAYS IN EACH NOSTRIL DAILY 16 g 3   fluticasone (FLOVENT HFA) 44 MCG/ACT inhaler Inhale 1 puff into the lungs 2 (two) times daily. 1 each 2   gabapentin (NEURONTIN) 600 MG tablet Take 1 tablet (600 mg total) by mouth 3 (three) times daily. 90 tablet 11   lisinopril-hydrochlorothiazide (ZESTORETIC) 20-12.5 MG tablet Take 1 tablet by mouth daily. 90 tablet 2   loratadine (CLARITIN) 10 MG tablet Take 1 tablet (10 mg total) by mouth daily. 30 tablet 0   metoCLOPramide (REGLAN) 5 MG tablet Take 1 tablet (5 mg total) by mouth 3 (three) times daily as needed for nausea. (Patient not taking: Reported on 07/01/2022) 90 tablet 1   Multiple Vitamin (MULTIVITAMIN WITH  MINERALS) TABS tablet Take 1 tablet by mouth daily.     omeprazole (PRILOSEC) 40 MG capsule Take 1 capsule (40 mg total) by mouth in the morning and at bedtime. 60 capsule 3   promethazine (PHENERGAN) 25 MG tablet Take 1 tablet (25 mg total) by mouth every 8 (eight) hours as needed. 30 tablet 0   sertraline (ZOLOFT) 100 MG tablet Take 2 tablets (200 mg total) by mouth daily. 180 tablet 3   sodium chloride (OCEAN) 0.65 % SOLN nasal spray Place 1 spray into both nostrils 2 (two) times daily as needed. 30 mL 0   sucralfate (CARAFATE) 1 GM/10ML suspension Take 10 mLs (1 g total) by mouth every 6 (six) hours as needed. 420 mL 1   No current facility-administered medications on file prior to visit.    Past  Surgical History:  Procedure Laterality Date   APPENDECTOMY     Bladder tack  10/13/2006   cystocoele   BREAST SURGERY Right    benign cyst   CARPAL TUNNEL RELEASE Left 10/25/2019   Procedure: LEFT CARPAL TUNNEL RELEASE;  Surgeon: Kerrin Champagne, MD;  Location: [facility];  Service: Orthopedics;  Laterality: Left;   COLONOSCOPY     IRRIGATION AND DEBRIDEMENT SHOULDER Right 02/21/2020   Procedure: IRRIGATION AND DEBRIDEMENT RIGHT SHOULDER;  Surgeon: Tarry Kos, MD;  Location: [facility];  Service: Orthopedics;  Laterality: Right;   LUMBAR LAMINECTOMY N/A 03/16/2019   Procedure: CENTRAL LAMINECTOMIES L2-3, L3-4 AND L4-5;  Surgeon: Kerrin Champagne, MD;  Location: [facility];  Service: Orthopedics;  Laterality: N/A;   ROTATOR CUFF REPAIR     TOTAL HIP ARTHROPLASTY Right 04/27/2018   Procedure: RIGHT TOTAL HIP ARTHROPLASTY ANTERIOR APPROACH;  Surgeon: Tarry Kos, MD;  Location: [facility];  Service: Orthopedics;  Laterality: Right;   TUBAL LIGATION     VAGINAL HYSTERECTOMY  2008    Allergies  Allergen Reactions   Tramadol Other (See Comments)    Hallucinate, nightmare     BP (!) 148/97   Ht 5\' 5"  (1.651 m)   Wt 170 lb (77.1 kg)   BMI 28.29 kg/m       No data to display              No data to display              Objective:  Physical Exam:  Gen: NAD, comfortable in exam room  Knee, left:  Inspection was positive for moderate effusion and slight erythema, but no increased warmth. No obvious bony abnormalities or signs of osteophyte development. Palpation yielded no asymmetric warmth; Bilateral joint line tenderness; No condyle tenderness; Patellar tenderness; No knee crepitus. Patellar and quadriceps tendons unremarkable, and no tenderness of the pes anserine bursa. No obvious Baker's cyst development. ROM is decreased significant due to pain, extension is ok but flexion is reduced to about 30 degrees. Strength is significantly reduced due to  pain.     Assessment & Plan:  1. 1. Chronic pain of left knee - [name] LIMITED JOINT SPACE STRUCTURES LOW LEFT; Future -  Patient presenting with significant swelling of the left knee, there is some erythema but no warmth. Patient has a known hx of degenerative changes, given findings will go ahead and aspirate knee today as well as inject steroid. Patient was able to tolerate the procedure well and noted improvement after fluid was drained. Will do a short course of Norco for patient give significant pain as  she waits for steroid to take full effect. - HYDROcodone-acetaminophen (NORCO) 7.5-325 MG tablet; Take 1 tablet by mouth every 8 (eight) hours as needed for moderate pain (pain score 4-6).  Dispense: 10 tablet; Refill: 0   PROCEDURE:  Risks & benefits of left knee aspiration & cortisone injection with ultrasound guidance reviewed. Consent obtained. Time-out completed. Patient prepped and draped in the normal fashion. Area cleansed with chlorhexidene. Ethyl chloride spray used to anesthetize the skin. Solution of 5 mL 1% lidocaine injected into superior lateral aspect of  knee for local anesthesia.  After ensuring adequate anesthesia an 18-g 1.5-inch needle on 60-mL syringe inserted into  knee via superior-lateral approach under ultrasound guidance.  Aspirated 25 cc of yellow synovial fluid - was not sent for analysis.  Needle left in place, syringe exchanged for syringe with solution of 4 mL 1% lidocaine with 1 mL methylprednisolone (Depo-medrol) 40mg /mL.  This was injected into the left knee without complication. Patient tolerated procedure well without any complications. Area covered with adhesive bandage. Post-procedure care reviewed.  All questions answered.   Brenton Grills MD, PGY-4  Sports Medicine Fellow [facility]  Addendum:  Patient seen and examined in the office by fellow.   History, exam, plan of care were precepted with me.  Agree with findings as documented in  fellow note.  Darene Lamer, DO, CAQSM

## 2022-12-31 NOTE — Telephone Encounter (Addendum)
Patient called she is requesting a rx refill for Seroquel and rosuvastatin she is also requesting a rx for ibuprofen 800 or 600 MG for her knee pain. Patient stated she has a torn meniscus.

## 2023-01-01 NOTE — Patient Instructions (Signed)
Today you received an injection with corticosteroid. This injection is usually done in response to pain and inflammation. There is some "numbing" medicine also in the shot so the injected area may be numb and feel really good for the next couple of hours. The numbing medicine usually wears off in 2-3 hours though, and then your pain level will be right back where it was before the injection.   The actually benefit from the steroid injection is usually noticed in 2-7 days. You may actually experience a small (as in 10%) INCREASE in pain in the first 24 hours---that is common.   Things to watch out for that you should contact us or a health care provider urgently would include: 1. Unusual (as in more than 10%) increase in pain 2. New fever > 101.5 3. New swelling or redness of the injected area.  4. Streaking of red lines around the area injected.  

## 2023-01-03 NOTE — Progress Notes (Signed)
Internal Medicine Clinic Attending  Case and documentation of CMA Everardo All reviewed.  I reviewed the AWV findings.  I agree with the plan of care documented in the AWV note.  \  Debe Coder, MD

## 2023-01-07 ENCOUNTER — Telehealth: Payer: Self-pay | Admitting: *Deleted

## 2023-01-07 NOTE — Telephone Encounter (Signed)
Call from patient states was changed to Zoloft from Prozac.  Prefers the Prozac as the Zoloft has not helped even when she was in the hospital.  States that the Zoloft makes her Jittery.  Has not had any of the Zoloft for a few days and just feels on edge.  Would like to have prescription for the Prozac sent to the [facility] on [address].

## 2023-01-13 NOTE — Telephone Encounter (Signed)
On chart review, patient currently is prescribed zoloft 200 mg daily. She was on prozac in 2021 but this medication was discontinued and notes indicate this was because patient did not find it to be helpful.  I attempted to call and talk with patient, voicemail left this afternoon.  P: I would like to call and talk with her prior to sending in new prescription.  Will plan to switch from Zoloft 200 mg to Prozac 60 mg.  Will want to ensure patient is educated to not take both medications at this this would increase her risk for serotonin syndrome.  Once I am able to reach her she will also need follow-up within 4 to 6 weeks after initiating Prozac.

## 2023-01-15 ENCOUNTER — Other Ambulatory Visit: Payer: Self-pay | Admitting: Internal Medicine

## 2023-01-15 DIAGNOSIS — F32A Depression, unspecified: Secondary | ICD-10-CM

## 2023-01-17 ENCOUNTER — Telehealth: Payer: Self-pay | Admitting: Internal Medicine

## 2023-01-17 NOTE — Telephone Encounter (Signed)
Pt states the following medication is not working. Pt is wishing to go back to Prozac instead  sertraline (ZOLOFT) 100 MG tablet   Pt is requesting to use a different pharmacy listed below   fluticasone (FLONASE) 50 MCG/ACT nasal spray     [id number] - [place], [place] - [address] (Ph: [phone])

## 2023-01-20 ENCOUNTER — Ambulatory Visit: Payer: 59 | Admitting: Obstetrics and Gynecology

## 2023-01-20 ENCOUNTER — Telehealth: Payer: Self-pay | Admitting: Internal Medicine

## 2023-01-20 DIAGNOSIS — F32A Depression, unspecified: Secondary | ICD-10-CM

## 2023-01-20 MED ORDER — FLUOXETINE HCL 20 MG PO CAPS: 20.0000 mg | ORAL_CAPSULE | Freq: Every day | ORAL | 2 refills | Status: DC

## 2023-01-20 NOTE — Telephone Encounter (Signed)
Patient called in about wanted to switch from zoloft to prozac. She was taking zoloft 200 mg. She stopped taking this more than 2 weeks ago and did not have side effects from suddenly stopping.  P: Zoloft removed from medication list  Start prozac 20 mg every day, plan to follow-up in 6 weeks to see if dose titration is needed.  Referral to [facility] placed.   Patient previously followed with Dr. Monna Fam. I see that she was referred in 2/24 however she was not reachable by phone so did not establish care. She remains interested in counseling.

## 2023-01-23 ENCOUNTER — Ambulatory Visit: Payer: 59 | Admitting: Obstetrics and Gynecology

## 2023-01-23 ENCOUNTER — Encounter: Payer: Self-pay | Admitting: Obstetrics and Gynecology

## 2023-01-23 VITALS — BP 160/94 | HR 75 | Wt 173.8 lb

## 2023-01-23 DIAGNOSIS — B977 Papillomavirus as the cause of diseases classified elsewhere: Secondary | ICD-10-CM

## 2023-01-23 DIAGNOSIS — R87811 Vaginal high risk human papillomavirus (HPV) DNA test positive: Secondary | ICD-10-CM | POA: Diagnosis not present

## 2023-01-23 NOTE — Progress Notes (Signed)
    GYNECOLOGY OFFICE VAGINOSCOPY PROCEDURE NOTE  72 y.o. [id number] here for vaginoscopy for +HPV.   Patient has history of TVH/USLS and anterior repair with MUS. She was not sure she had a hysterectomy, but I was previously able to confirm with her operative note from 10/14/2006 and her pathology report w/ no e/o cervical dysplasia. She has been getting paps partially because she was unaware that she no longer had a uterus/cervix. Her pap history is as follows:  09/25/06 NILM 11/14/08 vaginal pap NILM 03/02/18 vaginal pap ASC-H/HPV+, neg 16/18/45 09/01/20 vaginal pap ASCUS/HPV+ 07/01/22 vaginal pap NILM/HPV+, neg 16/18/45  Pregnancy test:  not indicated Gardasil: n/a  Informed consent and review of risks, benefit and alternatives performed. We discussed overall low risk of progression to vaginal cancer, but that we will look for any masses or abnormalities today. Written consent given.   Speculum inserted into patient's vagina assuring full view of vaginal walls. 3 swabs of vinegar solution applied to the vaginal walls and colposcope was used to observe both the vaginal walls. No AWE epithelium seen. Lugols applied. Small non-staining area on right side that likely corresponds with the angle of the vaginal cuff/location of USLS. Rectocele noted. Biopsies deferred  Pt tolerated well.  Patient was given post procedure instructions. Plan for repeat vaginal cotesting in 1 year.  Harvie Bridge, MD Obstetrician & Gynecologist, [facility] for [employer], [facility]

## 2023-01-23 NOTE — Patient Instructions (Signed)
Your exam today was overall normal. No biopsies are needed right now. We will follow up your pap test in 1 year.

## 2023-01-30 ENCOUNTER — Telehealth: Payer: Self-pay | Admitting: Physical Medicine and Rehabilitation

## 2023-01-30 ENCOUNTER — Ambulatory Visit: Payer: 59 | Admitting: Physical Medicine and Rehabilitation

## 2023-01-30 ENCOUNTER — Ambulatory Visit: Payer: 59 | Admitting: Orthopedic Surgery

## 2023-01-30 NOTE — Telephone Encounter (Signed)
Patient called needing to R/S her appointment. The number to contact patient is [phone]

## 2023-02-09 ENCOUNTER — Other Ambulatory Visit: Payer: Self-pay | Admitting: Internal Medicine

## 2023-02-09 DIAGNOSIS — F32A Depression, unspecified: Secondary | ICD-10-CM

## 2023-02-13 ENCOUNTER — Encounter: Payer: Self-pay | Admitting: Obstetrics and Gynecology

## 2023-02-17 ENCOUNTER — Telehealth: Payer: Self-pay

## 2023-02-17 NOTE — Telephone Encounter (Signed)
 Pt is requesting a call back.. she is wanting to have a test for Osteoporosis done  and want to  know how to go about getting done

## 2023-02-19 ENCOUNTER — Telehealth: Payer: Self-pay | Admitting: *Deleted

## 2023-02-19 NOTE — Progress Notes (Signed)
 Complex Care Management Note  Care Guide Note 02/19/2023 Name: Olivia Ewing MRN: [id number] DOB: 1950/11/25  Olivia Ewing is a 73 y.o. year old female who sees Masters, Izetta, DO for primary care. I reached out to Jean CINDERELLA Glatter by phone today to offer complex care management services.  Ms. Boule was given information about Complex Care Management services today including:   The Complex Care Management services include support from the care team which includes your Nurse Coordinator, Clinical Social Worker, or Pharmacist.  The Complex Care Management team is here to help remove barriers to the health concerns and goals most important to you. Complex Care Management services are voluntary, and the patient may decline or stop services at any time by request to their care team member.   Complex Care Management Consent Status: Patient agreed to services and verbal consent obtained.   Follow up plan:  Telephone appointment with complex care management team member scheduled for:  1/10  Encounter Outcome:  Patient Scheduled  [facility] Coordination Care Guide  Direct Dial: [phone]

## 2023-02-19 NOTE — Progress Notes (Signed)
 Complex Care Management Note Care Guide Note  02/19/2023 Name: Olivia Ewing MRN: [id number] DOB: 1950-06-19   Complex Care Management Outreach Attempts: An unsuccessful telephone outreach was attempted today to offer the patient information about available complex care management services.  Follow Up Plan:  Additional outreach attempts will be made to offer the patient complex care management information and services.   Encounter Outcome:  No Answer  Harlene Satterfield  Care Coordination Care Guide  Direct Dial: [phone]

## 2023-02-20 ENCOUNTER — Other Ambulatory Visit: Payer: Self-pay | Admitting: Internal Medicine

## 2023-02-20 ENCOUNTER — Encounter: Payer: Self-pay | Admitting: *Deleted

## 2023-02-20 ENCOUNTER — Telehealth: Payer: Self-pay | Admitting: Physical Medicine and Rehabilitation

## 2023-02-20 ENCOUNTER — Encounter: Payer: Self-pay | Admitting: Physical Medicine and Rehabilitation

## 2023-02-20 ENCOUNTER — Ambulatory Visit: Payer: 59 | Admitting: Physical Medicine and Rehabilitation

## 2023-02-20 VITALS — BP 131/89 | HR 84

## 2023-02-20 DIAGNOSIS — G8929 Other chronic pain: Secondary | ICD-10-CM

## 2023-02-20 DIAGNOSIS — M5441 Lumbago with sciatica, right side: Secondary | ICD-10-CM | POA: Diagnosis not present

## 2023-02-20 DIAGNOSIS — M5416 Radiculopathy, lumbar region: Secondary | ICD-10-CM

## 2023-02-20 DIAGNOSIS — M961 Postlaminectomy syndrome, not elsewhere classified: Secondary | ICD-10-CM | POA: Diagnosis not present

## 2023-02-20 DIAGNOSIS — Z780 Asymptomatic menopausal state: Secondary | ICD-10-CM

## 2023-02-20 MED ORDER — HYDROCODONE-ACETAMINOPHEN 5-325 MG PO TABS: 1.0000 | ORAL_TABLET | Freq: Three times a day (TID) | ORAL | 0 refills | Status: AC | PRN

## 2023-02-20 NOTE — Telephone Encounter (Signed)
 Called pt - no answer; a My Chart message sent.

## 2023-02-20 NOTE — Progress Notes (Signed)
  Immobilizing (10)   Unable to move or talk due to intensity of pain/unable to sleep and unable to use distraction. Severe range order  Average Pain 9  States low back pain on the right side that radiates down right leg.  Numbness in the back of right heel and tingling in her toes.  Hurts to walk, sit, and stand.  Feels like a push in her back when standing to long.  Been taking Tylenol  and Ibuprofen .

## 2023-02-20 NOTE — Progress Notes (Signed)
 Olivia Ewing - 73 y.o. female MRN [id number]  Date of birth: 1950-08-15  Office Visit Note: Visit Date: 02/20/2023 PCP: Kenn Pagan, DO Referred by: Masters, Psychiatric Nurse, DO  Subjective: Chief Complaint  Patient presents with   Lower Back - Pain   HPI: Olivia Ewing is a 73 y.o. female who comes in today as a self referral for evaluation of chronic, worsening and severe bilateral lower back pain radiating to right buttock down posterolateral leg to heel and 5th toe. She reports chronic issues with lower back pain, worsened about 1 year ago. Her pain worsens with standing and walking. She describes her pain as sharp and stabbing sensation, currently rates as 8 out of 10. She reports numbness and tingling to right heel and and lateral aspect of foot. Some relief of pain with home exercise regimen, rest and use of medications. History of prior physical therapy for lower back many years ago. Lumbar MRI imaging from May of 2024 exhibits improved central canal stenosis at L4-L5. There is progressive moderate right subarticular recess narrowing at L5-S1. No high grade spinal canal stenosis noted. Patient underwent gastrointestinal surgery to removal duodenal polyp in August, she reports complications from surgery with extended hospital stay, she feels extended hospitalization exacerbated her lower back pain. Patient denies focal weakness. No recent trauma or falls.       Review of Systems  Musculoskeletal:  Positive for back pain.  Neurological:  Positive for tingling. Negative for focal weakness and weakness.  All other systems reviewed and are negative.  Otherwise per HPI.  Assessment & Plan: Visit Diagnoses:    ICD-10-CM   1. Chronic right-sided low back pain with right-sided sciatica  M54.41 Ambulatory referral to Physical Medicine Rehab   G89.29     2. Lumbar radiculopathy  M54.16 Ambulatory referral to Physical Medicine Rehab    3. Post laminectomy syndrome  M96.1 Ambulatory  referral to Physical Medicine Rehab       Plan: Findings:  Chronic, worsening and severe bilateral lower back pain radiating to right buttock down posterolateral leg. Patient continues to have severe pain despite good conservative therapies such as formal physical therapy, home exercise regimen, rest and use of medications. Patients clinical presentation and exam are consistent with S1 nerve pattern. There is moderate right subarticular lateral recess stenosis at the level of L5-S1.  We discussed treatment plan in detail today.  Next step is to perform right S1 transforaminal epidural steroid injection under fluoroscopic guidance.  If good relief of pain with injection we can repeat this procedure infrequently as needed.  I discussed injection procedure with patient today in detail, she has no questions at this time. I also discussed medication management with her and prescribed short course of Norco until we are able to get her in for injection. No red flag symptoms noted upon exam today.     Meds & Orders:  Meds ordered this encounter  Medications   HYDROcodone -acetaminophen  (NORCO/VICODIN) 5-325 MG tablet    Sig: Take 1 tablet by mouth every 8 (eight) hours as needed for up to 5 days for moderate pain (pain score 4-6) or severe pain (pain score 7-10).    Dispense:  20 tablet    Refill:  0    Orders Placed This Encounter  Procedures   Ambulatory referral to Physical Medicine Rehab    Follow-up: Return for Right S1 transforaminal epidural steroid injection.   Procedures: No procedures performed      Clinical History: CLINICAL DATA:  Lumbar radiculopathy, symptoms persist with > 6 wks treatment   EXAM: MRI LUMBAR SPINE WITHOUT CONTRAST   TECHNIQUE: Multiplanar, multisequence MR imaging of the lumbar spine was performed. No intravenous contrast was administered.   COMPARISON:  MRI lumbar spine December 30, 2018.   FINDINGS: Segmentation: Standard segmentation is assumed. The  inferior-most fully formed intervertebral disc labeled L5-S1.   Alignment:  No substantial sagittal subluxation.   Vertebrae: Degenerative/discogenic endplate signal changes at multiple levels. No specific evidence of acute fracture, discitis/osteomyelitis, or suspicious bone lesion.   Conus medullaris and cauda equina: Conus extends to the L1-L2 level. Conus appears normal.   Paraspinal and other soft tissues: Partially imaged left renal cyst.   Disc levels:   T12-L1: No significant disc protrusion, foraminal stenosis, or canal stenosis.   L1-L2: Broad disc bulging with superimposed left subarticular disc protrusion. Bilateral facet hypertrophy and ligamentum flavum thickening. Resulting moderate to severe left subarticular recess stenosis with potential for impingement. Mild central canal stenosis and mild foraminal stenosis.   L2-L3: Disc bulging and endplate spurring. Left greater than right facet arthropathy. Ligamentum flavum thickening. Resulting mild-to-moderate left and mild right foraminal stenosis. Patent central canal.   L3-L4: Broad disc bulge. Evidence of prior postoperative changes posteriorly. Bilateral facet arthropathy. Moderate right and severe left foraminal stenosis, which is progressed. Patent central canal.   L4-L5: Postoperative changes posteriorly. Broad disc bulging. Bilateral facet arthropathy. Severe left and moderate right foraminal stenosis, progressed. Patent central canal.   L5-S1: Disc bulging and endplate spurring with superimposed small central disc protrusion. Resulting moderate right subarticular recess stenosis. Mild central canal stenosis. Mild left foraminal stenosis.   IMPRESSION: 1. 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 Canal stenosis is improved at L4-L5, now patent. 2. At L1-L2, progressive moderate to severe left subarticular recess stenosis. 3. At L5-S1, progressive  moderate right subarticular recess narrowing. Mild central canal and left foraminal stenosis. 4. At L2-L3, mild to moderate left and mild right foraminal stenosis.     Electronically Signed   By: Gilmore GORMAN Molt M.D.   On: 07/05/2022 08:52   She reports that she quit smoking about 39 years ago. Her smoking use included cigarettes. She has never been exposed to tobacco smoke. She has never used smokeless tobacco. No results for input(s): HGBA1C, LABURIC in the last 8760 hours.  Objective:  VS:  HT:    WT:   BMI:     BP:131/89  HR:84bpm  TEMP: ( )  RESP:  Physical Exam Vitals and nursing note reviewed.  HENT:     Head: Normocephalic and atraumatic.     Right Ear: External ear normal.     Left Ear: External ear normal.     Nose: Nose normal.     Mouth/Throat:     Mouth: Mucous membranes are moist.  Eyes:     Extraocular Movements: Extraocular movements intact.  Cardiovascular:     Rate and Rhythm: Normal rate.     Pulses: Normal pulses.  Pulmonary:     Effort: Pulmonary effort is normal.  Abdominal:     General: Abdomen is flat. There is no distension.  Musculoskeletal:        General: Tenderness present.     Cervical back: Normal range of motion.     Comments: Patient rises from seated position to standing without difficulty. Good lumbar range of motion. No pain noted with facet loading. 5/5 strength noted with bilateral hip flexion, knee flexion/extension,  ankle dorsiflexion/plantarflexion and EHL. No clonus noted bilaterally. No pain upon palpation of greater trochanters. No pain with internal/external rotation of bilateral hips. Sensation intact bilaterally. Dysesthesias noted to right S1 dermatome. Negative slump test bilaterally. Ambulates without aid, gait steady.     Skin:    General: Skin is warm and dry.     Capillary Refill: Capillary refill takes less than 2 seconds.  Neurological:     General: No focal deficit present.     Mental Status: She is alert and  oriented to person, place, and time.  Psychiatric:        Mood and Affect: Mood normal.        Behavior: Behavior normal.     Ortho Exam  Imaging: No results found.  Past Medical/Family/Surgical/Social History: Medications & Allergies reviewed per EMR, new medications updated. Patient Active Problem List   Diagnosis Date Noted   H/O Whipple procedure 12/19/2022   Polyp of duodenum 04/03/2022   Nausea 01/29/2022   Change in bowel habits 01/29/2022   Primary osteoarthritis of left knee 11/29/2021   Trigger finger, right ring finger 04/10/2021   Trigger finger, left ring finger 02/26/2021   Asthma 12/13/2020   GERD (gastroesophageal reflux disease) 12/13/2020   Housing instability 12/13/2020   Atypical squamous cells cannot exclude high grade squamous intraepithelial lesion on cytologic smear of cervix (ASC-H) 09/01/2020   Vaginal itching 09/01/2020   De Quervain's disease (tenosynovitis) 08/22/2020   Insomnia 06/08/2020   Medication monitoring encounter 02/25/2020   S/P arthroscopy of right shoulder 01/05/2020   Carpal tunnel syndrome, left upper limb 10/25/2019    Class: Chronic   Carpal tunnel syndrome, right upper limb 10/25/2019    Class: Chronic   Tendinopathy of right biceps tendon 07/27/2019   Breast cancer screening by mammogram 06/07/2019   Seasonal allergic rhinitis due to pollen 06/07/2019   Colon cancer screening 06/07/2019   Lumbar stenosis with neurogenic claudication 03/16/2019    Class: Chronic   Acute stress reaction 09/25/2018   Vitamin D  insufficiency 11/25/2013   Healthcare maintenance 11/25/2013   Depression 09/23/2011   Functional incontinence 10/18/2010   Pain in right leg 10/18/2010   Iron deficiency anemia 05/12/2008   Hyperlipemia 06/18/2007   Essential hypertension 05/20/2007   Past Medical History:  Diagnosis Date   Anemia    Arthritis    knees hands   Arthritis, degenerative 11/17/2012   Arthrosis of right acromioclavicular joint  06/05/2012   Bipolar disorder (HCC)    Cervicalgia    Depression    Environmental allergies    cause SOB, uses inhaler for   Environmental and seasonal allergies    uses inhaler prn   Full dentures    GERD (gastroesophageal reflux disease)    diet controlled - no meds   Hyperlipidemia    diet controlled, no meds   Hypertension    Left knee DJD, degenerative meniscus tear 04/06/2012   Steroid injections: 09/2013 12/2013    Lumbar radiculopathy, chronic    Nontraumatic incomplete tear of right rotator cuff 07/27/2019   Primary osteoarthritis of right hip 04/27/2018   Right knee meniscal tear 03/14/2011   Right knee pain    posterior horn medial meniscal tear MRI 2013   Septic arthritis (HCC) 02/25/2020   Spinal stenosis    getting epidural injections -last one 06/12/2015   Status post lumbar laminectomy 03/16/2019   Status post total hip replacement, 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   Family History  Problem Relation Age of Onset   Hypertension  Mother    Colon cancer Neg Hx    Esophageal cancer Neg Hx    Rectal cancer Neg Hx    Stomach cancer Neg Hx    Past Surgical History:  Procedure Laterality Date   APPENDECTOMY     Bladder tack  10/13/2006   cystocoele   BREAST SURGERY Right    benign cyst   CARPAL TUNNEL RELEASE Left 10/25/2019   Procedure: LEFT CARPAL TUNNEL RELEASE;  Surgeon: Lucilla Lynwood BRAVO, MD;  Location: [facility];  Service: Orthopedics;  Laterality: Left;   COLONOSCOPY     IRRIGATION AND DEBRIDEMENT SHOULDER Right 02/21/2020   Procedure: IRRIGATION AND DEBRIDEMENT RIGHT SHOULDER;  Surgeon: Jerri Kay HERO, MD;  Location: [facility];  Service: Orthopedics;  Laterality: Right;   LUMBAR LAMINECTOMY N/A 03/16/2019   Procedure: CENTRAL LAMINECTOMIES L2-3, L3-4 AND L4-5;  Surgeon: Lucilla Lynwood BRAVO, MD;  Location: [facility];  Service: Orthopedics;  Laterality: N/A;   ROTATOR CUFF REPAIR     TOTAL HIP ARTHROPLASTY Right 04/27/2018   Procedure: RIGHT TOTAL HIP  ARTHROPLASTY ANTERIOR APPROACH;  Surgeon: Jerri Kay HERO, MD;  Location: [facility];  Service: Orthopedics;  Laterality: Right;   TUBAL LIGATION     VAGINAL HYSTERECTOMY  2008   Social History   Occupational History   Not on file  Tobacco Use   Smoking status: Former    Current packs/day: 0.00    Types: Cigarettes    Quit date: 11/26/1983    Years since quitting: 39.2    Passive exposure: Never   Smokeless tobacco: Never  Vaping Use   Vaping status: Never Used  Substance and Sexual Activity   Alcohol use: Yes    Alcohol/week: 0.0 standard drinks of alcohol    Comment: rarely   Drug use: Not Currently    Types: Marijuana    Comment: last used 2019   Sexual activity: Yes    Birth control/protection: Surgical

## 2023-02-20 NOTE — Progress Notes (Signed)
 Patient called requesting testing for osteoporosis.  Order placed for DEXA scan to screen for osteoporosis.  Unable to reach patient by phone this AM. VM left.

## 2023-02-20 NOTE — Telephone Encounter (Signed)
 Patient called and said that she can get a ride on Monday, Wednesdays and  Fridays. [phone]

## 2023-02-21 ENCOUNTER — Encounter: Payer: Self-pay | Admitting: Licensed Clinical Social Worker

## 2023-02-24 ENCOUNTER — Telehealth: Payer: Self-pay | Admitting: Physical Medicine and Rehabilitation

## 2023-02-24 NOTE — Telephone Encounter (Signed)
 Patient called needing to schedule an appointment with Dr. Alvester Morin for an injection in her back. The number to contact patient is [phone]

## 2023-02-25 ENCOUNTER — Ambulatory Visit: Payer: Self-pay | Admitting: Licensed Clinical Social Worker

## 2023-02-25 NOTE — Patient Instructions (Signed)
 Visit Information  Thank you for taking time to visit with me today. Please don't hesitate to contact me if I can be of assistance to you.   Following are the goals we discussed today:   Goals Addressed             This Visit's Progress    Increase access to housing       Care Coordination Interventions: Assessed Social Determinants of Health Reviewed all upcoming appointments in Epic system Solution-Focused Strategies employed:  Active listening / Reflection utilized  Emotional Support Provided Look out for call back from [employer] regarding senior housing list and ask for list housing options in her area Visit housing authority in person for housing list/assistance           Our next appointment is by telephone on 03/11/2023.  Please call the care guide team at [phone] if you need to cancel or reschedule your appointment.   If you are experiencing a Mental Health or Behavioral Health Crisis or need someone to talk to, please call the Suicide and Crisis Lifeline: 988  Patient verbalizes understanding of instructions and care plan provided today and agrees to view in MyChart. Active MyChart status and patient understanding of how to access instructions and care plan via MyChart confirmed with patient.     Telephone follow up appointment with care management team member scheduled for: 03/11/23

## 2023-02-25 NOTE — Patient Outreach (Signed)
  Care Coordination   Initial Visit Note   02/25/2023 Name: KASHIRA BEHUNIN MRN: [id number] DOB: 10/05/1950  MARIALIZ FERREBEE is a 73 y.o. year old female who sees Masters, Izetta, DO for primary care. I spoke with  Jean CINDERELLA Glatter by phone today.  What matters to the patients health and wellness today?  Increasing access to housing options.    Goals Addressed             This Visit's Progress    Increase access to housing       Care Coordination Interventions: Assessed Social Determinants of Health Reviewed all upcoming appointments in Epic system Solution-Focused Strategies employed:  Active listening / Reflection utilized  Emotional Support Provided Look out for call back from [employer] regarding senior housing list and ask for list housing options in her area Visit housing authority in person for housing list/assistance           SDOH assessments and interventions completed:  Yes  SDOH Interventions Today    Flowsheet Row Most Recent Value  SDOH Interventions   Food Insecurity Interventions Intervention Not Indicated  Housing Interventions Intervention Not Indicated  [Pt reports she will have to move by May of this year and she is unsure of where she will move.]  Transportation Interventions --  [Pt reports she takes the bus and has family who will give rides if needed.]  Utilities Interventions Intervention Not Indicated        Care Coordination Interventions:  Yes, provided   Interventions Today    Flowsheet Row Most Recent Value  Chronic Disease   Chronic disease during today's visit Other  [Depression]  General Interventions   General Interventions Discussed/Reviewed [employer], General Interventions Discussed  [Pt reports she is currently staying with her dtr and believes she will have to leave find a new home by May of this year. We reviewed local housing resources, contacted H ousing Authority and left VM with pt's number.Pt will  visit them in person tomorrow]  Mental Health Interventions   Mental Health Discussed/Reviewed Mental Health Discussed, Mental Health Reviewed, Grief and Loss, Depression, Anxiety  [Pt reported depressive symtpoms since having stroke last year and throughout health issues. Pt was recently put on prozac . We discussed pt's coping skills and relaxation techniques she can employ when stress is high.]        Follow up plan: Follow up call scheduled for 03/11/23    Encounter Outcome:  Patient Visit Completed   Alm Armor, LCSW [facility], [facility] Licensed Clinical Social Worker Care Coordinator [phone]

## 2023-02-27 ENCOUNTER — Telehealth: Payer: Self-pay

## 2023-02-27 DIAGNOSIS — J45909 Unspecified asthma, uncomplicated: Secondary | ICD-10-CM

## 2023-02-27 MED ORDER — ALBUTEROL SULFATE HFA 108 (90 BASE) MCG/ACT IN AERS: INHALATION_SPRAY | RESPIRATORY_TRACT | 3 refills | Status: DC

## 2023-02-27 NOTE — Telephone Encounter (Signed)
Patient called she is requesting a breathing machine, patient stated she has been coughing more due to the weather and when she is out in public. Patient is also requesting arx refill for her albuterol.

## 2023-03-03 ENCOUNTER — Telehealth: Payer: Self-pay

## 2023-03-03 ENCOUNTER — Other Ambulatory Visit: Payer: Self-pay | Admitting: Physical Medicine and Rehabilitation

## 2023-03-03 MED ORDER — HYDROCODONE-ACETAMINOPHEN 5-325 MG PO TABS: 1.0000 | ORAL_TABLET | Freq: Three times a day (TID) | ORAL | 0 refills | Status: DC | PRN

## 2023-03-03 MED ORDER — HYDROCODONE-ACETAMINOPHEN 5-325 MG PO TABS: 1.0000 | ORAL_TABLET | Freq: Three times a day (TID) | ORAL | 0 refills | Status: AC | PRN

## 2023-03-03 NOTE — Telephone Encounter (Signed)
She called and would like a refill on her hydrocodone 325 since she is unable to come in for an injection until the 30th.

## 2023-03-11 ENCOUNTER — Ambulatory Visit: Payer: Self-pay | Admitting: Licensed Clinical Social Worker

## 2023-03-11 NOTE — Patient Instructions (Signed)
Visit Information  Thank you for taking time to visit with me today. Please don't hesitate to contact me if I can be of assistance to you.   Following are the goals we discussed today:   Goals Addressed             This Visit's Progress    Increase access to housing       Care Coordination Interventions: Assessed Social Determinants of Health Reviewed all upcoming appointments in Epic system Solution-Focused Strategies employed:  Active listening / Reflection utilized  Emotional Support Provided Look out for call back from [employer] regarding senior housing list and ask for list housing options in her area Visit housing authority in person for housing list/assistance Good job contacting housing authority to be put on waitlist for senior high rise apartment [employer] Authority in 1 month to request place on waitlist           Our next appointment is by telephone on 04/11/2023.  Please call the care guide team at [phone] if you need to cancel or reschedule your appointment.   If you are experiencing a Mental Health or Behavioral Health Crisis or need someone to talk to, please call the Suicide and Crisis Lifeline: 988  Patient verbalizes understanding of instructions and care plan provided today and agrees to view in MyChart. Active MyChart status and patient understanding of how to access instructions and care plan via MyChart confirmed with patient.     Telephone follow up appointment with care management team member scheduled for: 04/11/2023

## 2023-03-11 NOTE — Patient Outreach (Signed)
  Care Coordination   Follow Up Visit Note   03/11/2023 Name: Olivia Ewing MRN: [id number] DOB: May 05, 1950  Olivia Ewing is a 73 y.o. year old female who sees Masters, Florentina Addison, DO for primary care. I spoke with  Jonathon Jordan by phone today.  What matters to the patients health and wellness today?  Increase access to community housing.     Goals Addressed             This Visit's Progress    Increase access to housing       Care Coordination Interventions: Assessed Social Determinants of Health Reviewed all upcoming appointments in Epic system Solution-Focused Strategies employed:  Active listening / Reflection utilized  Emotional Support Provided Look out for call back from [employer] regarding senior housing list and ask for list housing options in her area Visit housing authority in person for housing list/assistance Good job contacting housing authority to be put on waitlist for senior high rise apartment [employer] Authority in 1 month to request place on waitlist           SDOH assessments and interventions completed:  Yes     Care Coordination Interventions:  Yes, provided   Interventions Today    Flowsheet Row Most Recent Value  Chronic Disease   Chronic disease during today's visit Other  [Housing]  General Interventions   General Interventions Discussed/Reviewed [employer], General Interventions Discussed  [Pt spoke with [employer] and was placed on waitlist for senior housing in [place] rise apartments. Pt also confirmed she had no holds on her accounts. Pt will call [facility] in one month to find place on waitlist]  Mental Health Interventions   Mental Health Discussed/Reviewed Mental Health Discussed, Mental Health Reviewed, Coping Strategies, Depression  [Pt reported that she has started going to church again - she plans on volunteering and would like to grow closer to the community. Pt stated this has  helped her MH symptoms and she would like to continue this positive growth.]        Follow up plan: Follow up call scheduled for 04/11/2023    Encounter Outcome:  Patient Visit Completed   Kenton Kingfisher, LCSW [facility], [facility] Licensed Clinical Social Worker Care Coordinator [phone]

## 2023-03-12 ENCOUNTER — Ambulatory Visit
Admission: RE | Admit: 2023-03-12 | Discharge: 2023-03-12 | Discharge status: Home / Self Care | Payer: 59 | Source: Ambulatory Visit | Attending: Internal Medicine | Admitting: Internal Medicine

## 2023-03-12 DIAGNOSIS — Z780 Asymptomatic menopausal state: Secondary | ICD-10-CM

## 2023-03-12 DIAGNOSIS — M8588 Other specified disorders of bone density and structure, other site: Secondary | ICD-10-CM | POA: Diagnosis not present

## 2023-03-13 ENCOUNTER — Ambulatory Visit: Payer: 59 | Admitting: Physical Medicine and Rehabilitation

## 2023-03-13 ENCOUNTER — Other Ambulatory Visit: Payer: Self-pay

## 2023-03-13 DIAGNOSIS — M5416 Radiculopathy, lumbar region: Secondary | ICD-10-CM | POA: Diagnosis not present

## 2023-03-13 MED ORDER — METHYLPREDNISOLONE ACETATE 40 MG/ML IJ SUSP
40.0000 mg | Freq: Once | INTRAMUSCULAR | Status: AC
  Administered 2023-03-13: 40 mg

## 2023-03-13 NOTE — Procedures (Signed)
S1 Lumbosacral Transforaminal Epidural Steroid Injection - Sub-Pedicular Approach with Fluoroscopic Guidance   Patient: Olivia Ewing      Date of Birth: 07/04/1950 MRN: [id number] PCP: Rudene Christians, DO      Visit Date: 03/13/2023   Universal Protocol:    Date/Time: 01/30/258:25 AM  Consent Given By: the patient  Position:  PRONE  Additional Comments: Vital signs were monitored before and after the procedure. Patient was prepped and draped in the usual sterile fashion. The correct patient, procedure, and site was verified.   Injection Procedure Details:  Procedure Site One Meds Administered:  Meds ordered this encounter  Medications   methylPREDNISolone acetate (DEPO-MEDROL) injection 40 mg    Laterality: Right  Location/Site:  S1 Foramen   Needle size: 22 ga.  Needle type: Spinal  Needle Placement: Transforaminal  Findings:   -Comments: Excellent flow of contrast along the nerve, nerve root and into the epidural space.  Epidurogram: Contrast epidurogram showed no nerve root cut off or restricted flow pattern.  Procedure Details: After squaring off the sacral end-plate to get a true AP view, the C-arm was positioned so that the best possible view of the S1 foramen was visualized. The soft tissues overlying this structure were infiltrated with 2-3 ml. of 1% Lidocaine without Epinephrine.    The spinal needle was inserted toward the target using a "trajectory" view along the fluoroscope beam.  Under AP and lateral visualization, the needle was advanced so it did not puncture dura. Biplanar projections were used to confirm position. Aspiration was confirmed to be negative for CSF and/or blood. A 1-2 ml. volume of Isovue-250 was injected and flow of contrast was noted at each level. Radiographs were obtained for documentation purposes.   After attaining the desired flow of contrast documented above, a 0.5 to 1.0 ml test dose of 0.25% Marcaine was injected into each  respective transforaminal space.  The patient was observed for 90 seconds post injection.  After no sensory deficits were reported, and normal lower extremity motor function was noted,   the above injectate was administered so that equal amounts of the injectate were placed at each foramen (level) into the transforaminal epidural space.   Additional Comments:  No complications occurred Dressing: Band-Aid with 2 x 2 sterile gauze    Post-procedure details: Patient was observed during the procedure. Post-procedure instructions were reviewed.  Patient left the clinic in stable condition.

## 2023-03-13 NOTE — Progress Notes (Signed)
Functional Pain Scale - descriptive words and definitions  Mild (2)   Noticeable when not distracted/no impact on ADL's/sleep only slightly affected and able to   use both passive and active distraction for comfort. Mild range order  Average Pain 4   +Driver, -BT, -Dye Allergies.

## 2023-03-13 NOTE — Patient Instructions (Signed)

## 2023-03-13 NOTE — Progress Notes (Signed)
KAYELA HUMPHRES - 73 y.o. female MRN [id number]  Date of birth: 05/10/1950  Office Visit Note: Visit Date: 03/13/2023 PCP: Rudene Christians, DO Referred by: Masters, Florentina Addison, DO  Subjective: Chief Complaint  Patient presents with   Lower Back - Pain   HPI:  Olivia Ewing is a 73 y.o. female who comes in today at the request of Ellin Goodie, FNP for planned Right S1-2 Lumbar Transforaminal epidural steroid injection with fluoroscopic guidance.  The patient has failed conservative care including home exercise, medications, time and activity modification.  This injection will be diagnostic and hopefully therapeutic.  Please see requesting physician notes for further details and justification.   ROS Otherwise per HPI.  Assessment & Plan: Visit Diagnoses:    ICD-10-CM   1. Lumbar radiculopathy  M54.16 XR C-ARM NO REPORT    Epidural Steroid injection    methylPREDNISolone acetate (DEPO-MEDROL) injection 40 mg      Plan: No additional findings.   Meds & Orders:  Meds ordered this encounter  Medications   methylPREDNISolone acetate (DEPO-MEDROL) injection 40 mg    Orders Placed This Encounter  Procedures   XR C-ARM NO REPORT   Epidural Steroid injection    Follow-up: Return if symptoms worsen or fail to improve.   Procedures: No procedures performed  S1 Lumbosacral Transforaminal Epidural Steroid Injection - Sub-Pedicular Approach with Fluoroscopic Guidance   Patient: Olivia Ewing      Date of Birth: 1950-11-18 MRN: [id number] PCP: Rudene Christians, DO      Visit Date: 03/13/2023   Universal Protocol:    Date/Time: 01/30/258:25 AM  Consent Given By: the patient  Position:  PRONE  Additional Comments: Vital signs were monitored before and after the procedure. Patient was prepped and draped in the usual sterile fashion. The correct patient, procedure, and site was verified.   Injection Procedure Details:  Procedure Site One Meds Administered:  Meds ordered  this encounter  Medications   methylPREDNISolone acetate (DEPO-MEDROL) injection 40 mg    Laterality: Right  Location/Site:  S1 Foramen   Needle size: 22 ga.  Needle type: Spinal  Needle Placement: Transforaminal  Findings:   -Comments: Excellent flow of contrast along the nerve, nerve root and into the epidural space.  Epidurogram: Contrast epidurogram showed no nerve root cut off or restricted flow pattern.  Procedure Details: After squaring off the sacral end-plate to get a true AP view, the C-arm was positioned so that the best possible view of the S1 foramen was visualized. The soft tissues overlying this structure were infiltrated with 2-3 ml. of 1% Lidocaine without Epinephrine.    The spinal needle was inserted toward the target using a "trajectory" view along the fluoroscope beam.  Under AP and lateral visualization, the needle was advanced so it did not puncture dura. Biplanar projections were used to confirm position. Aspiration was confirmed to be negative for CSF and/or blood. A 1-2 ml. volume of Isovue-250 was injected and flow of contrast was noted at each level. Radiographs were obtained for documentation purposes.   After attaining the desired flow of contrast documented above, a 0.5 to 1.0 ml test dose of 0.25% Marcaine was injected into each respective transforaminal space.  The patient was observed for 90 seconds post injection.  After no sensory deficits were reported, and normal lower extremity motor function was noted,   the above injectate was administered so that equal amounts of the injectate were placed at each foramen (level) into the transforaminal epidural space.  Additional Comments:  No complications occurred Dressing: Band-Aid with 2 x 2 sterile gauze    Post-procedure details: Patient was observed during the procedure. Post-procedure instructions were reviewed.  Patient left the clinic in stable condition.   Clinical History: CLINICAL  DATA:  Lumbar radiculopathy, symptoms persist with > 6 wks treatment   EXAM: MRI LUMBAR SPINE WITHOUT CONTRAST   TECHNIQUE: Multiplanar, multisequence MR imaging of the lumbar spine was performed. No intravenous contrast was administered.   COMPARISON:  MRI lumbar spine December 30, 2018.   FINDINGS: Segmentation: Standard segmentation is assumed. The inferior-most fully formed intervertebral disc labeled L5-S1.   Alignment:  No substantial sagittal subluxation.   Vertebrae: Degenerative/discogenic endplate signal changes at multiple levels. No specific evidence of acute fracture, discitis/osteomyelitis, or suspicious bone lesion.   Conus medullaris and cauda equina: Conus extends to the L1-L2 level. Conus appears normal.   Paraspinal and other soft tissues: Partially imaged left renal cyst.   Disc levels:   T12-L1: No significant disc protrusion, foraminal stenosis, or canal stenosis.   L1-L2: Broad disc bulging with superimposed left subarticular disc protrusion. Bilateral facet hypertrophy and ligamentum flavum thickening. Resulting moderate to severe left subarticular recess stenosis with potential for impingement. Mild central canal stenosis and mild foraminal stenosis.   L2-L3: Disc bulging and endplate spurring. Left greater than right facet arthropathy. Ligamentum flavum thickening. Resulting mild-to-moderate left and mild right foraminal stenosis. Patent central canal.   L3-L4: Broad disc bulge. Evidence of prior postoperative changes posteriorly. Bilateral facet arthropathy. Moderate right and severe left foraminal stenosis, which is progressed. Patent central canal.   L4-L5: Postoperative changes posteriorly. Broad disc bulging. Bilateral facet arthropathy. Severe left and moderate right foraminal stenosis, progressed. Patent central canal.   L5-S1: Disc bulging and endplate spurring with superimposed small central disc protrusion. Resulting moderate  right subarticular recess stenosis. Mild central canal stenosis. Mild left foraminal stenosis.   IMPRESSION: 1. 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 Canal stenosis is improved at L4-L5, now patent. 2. At L1-L2, progressive moderate to severe left subarticular recess stenosis. 3. At L5-S1, progressive moderate right subarticular recess narrowing. Mild central canal and left foraminal stenosis. 4. At L2-L3, mild to moderate left and mild right foraminal stenosis.     Electronically Signed   By: Feliberto Harts M.D.   On: 07/05/2022 08:52     Objective:  VS:  HT:    WT:   BMI:     BP:   HR: bpm  TEMP: ( )  RESP:  Physical Exam Vitals and nursing note reviewed.  Constitutional:      General: She is not in acute distress.    Appearance: Normal appearance. She is not ill-appearing.  HENT:     Head: Normocephalic and atraumatic.     Right Ear: External ear normal.     Left Ear: External ear normal.  Eyes:     Extraocular Movements: Extraocular movements intact.  Cardiovascular:     Rate and Rhythm: Normal rate.     Pulses: Normal pulses.  Pulmonary:     Effort: Pulmonary effort is normal. No respiratory distress.  Abdominal:     General: There is no distension.     Palpations: Abdomen is soft.  Musculoskeletal:        General: Tenderness present.     Cervical back: Neck supple.     Right lower leg: No edema.     Left lower leg: No edema.  Comments: Patient has good distal strength with no pain over the greater trochanters.  No clonus or focal weakness.  Skin:    Findings: No erythema, lesion or rash.  Neurological:     General: No focal deficit present.     Mental Status: She is alert and oriented to person, place, and time.     Sensory: No sensory deficit.     Motor: No weakness or abnormal muscle tone.     Coordination: Coordination normal.  Psychiatric:        Mood and Affect: Mood normal.         Behavior: Behavior normal.      Imaging: DG BONE DENSITY (DXA) Result Date: 03/12/2023 EXAM: DUAL X-RAY ABSORPTIOMETRY (DXA) FOR BONE MINERAL DENSITY IMPRESSION: Referring Physician:  Rudene Christians Your patient completed a bone mineral density test using GE Lunar iDXA system (analysis version: 16). Technologist: BEC PATIENT: Name: Terianne, Thaker Patient ID: [id number] Birth Date: November 28, 1950 Height: 63.0 in. Sex: Female Measured: 03/12/2023 Weight: 177.6 lbs. Indications: Advanced Age, Albuterol, Estrogen Deficient, Gabapentin, Height Loss (781.91), Hysterectomy, Omeprazole, Postmenopausal, Prozac, Right hip replaced, Secondary Osteoporosis, Seroquel, Vitamin D Deficient Fractures: NONE Treatments: Multivitamin, Vitamin D (E933.5) ASSESSMENT: The BMD measured at Forearm Radius 33% is 0.689 g/cm2 with a T-score of -2.1. This patient is considered osteopenic/low bone mass according to World Health Organization [facility]) criteria. L3 & L4 were excluded due to degenerative changes. Right hip excluded due to surgical hardware. The quality of the exam is limited by patient body habitus. Site Region Measured Date Measured Age YA BMD Significant CHANGE T-score Left Forearm Radius 33% 03/12/2023 71.2 -2.1 0.689 g/cm2 AP Spine L1-L2 03/12/2023 71.2 1.3 1.325 g/cm2 Left Femur Neck 03/12/2023 71.2 -1.2 0.877 g/cm2 World Health Organization [facility]) criteria for post-menopausal, Caucasian Women: Normal       T-score at or above -1 SD Osteopenia   T-score between -1 and -2.5 SD Osteoporosis T-score at or below -2.5 SD RECOMMENDATION: 1. All patients should optimize calcium and vitamin D intake. 2. Consider FDA-approved medical therapies in postmenopausal women and men aged 28 years and older, based on the following: a. A hip or vertebral (clinical or morphometric) fracture. b. T-score = -2.5 at the femoral neck or spine after appropriate evaluation to exclude secondary causes. c. Low bone mass (T-score between -1.0 and -2.5 at  the femoral neck or spine) and a 10-year probability of a hip fracture = 3% or a 10-year probability of a major osteoporosis-related fracture = 20% based on the [place]-adapted WHO algorithm. d. Clinician judgment and/or patient preferences may indicate treatment for people with 10-year fracture probabilities above or below these levels. FOLLOW-UP: Patients with diagnosis of osteoporosis or at high risk for fracture should have regular bone mineral density tests.? Patients eligible for Medicare are allowed routine testing every 2 years.? The testing frequency can be increased to one year for patients who have rapidly progressing disease, are receiving or discontinuing medical therapy to restore bone mass, or have additional risk factors. I have reviewed this study and agree with the findings. [facility] FRAX* 10-year Probability of Fracture Based on femoral neck BMD: Femur (Left) Major Osteoporotic Fracture: 4.0% Hip Fracture:                0.5% Population:                  [place] ([place]) Risk Factors:  Secondary Osteoporosis *FRAX is a Armed forces logistics/support/administrative officer of the [employer] of [employer] for Metabolic Bone Disease, a World Science writer (WHO) [employer]. ASSESSMENT: The probability of a major osteoporotic fracture is 4.0% within the next ten years. The probability of a hip fracture is 0.5% within the next ten years. Electronically Signed   By: Romona Curls M.D.   On: 03/12/2023 11:04

## 2023-03-14 ENCOUNTER — Ambulatory Visit (INDEPENDENT_AMBULATORY_CARE_PROVIDER_SITE_OTHER): Payer: 59 | Admitting: Student

## 2023-03-14 VITALS — BP 157/91 | HR 67 | Temp 97.9°F | Ht 65.0 in | Wt 184.0 lb

## 2023-03-14 DIAGNOSIS — Z0000 Encounter for general adult medical examination without abnormal findings: Secondary | ICD-10-CM

## 2023-03-14 DIAGNOSIS — F32A Depression, unspecified: Secondary | ICD-10-CM

## 2023-03-14 DIAGNOSIS — M8580 Other specified disorders of bone density and structure, unspecified site: Secondary | ICD-10-CM | POA: Diagnosis not present

## 2023-03-14 DIAGNOSIS — J301 Allergic rhinitis due to pollen: Secondary | ICD-10-CM

## 2023-03-14 DIAGNOSIS — J45909 Unspecified asthma, uncomplicated: Secondary | ICD-10-CM

## 2023-03-14 DIAGNOSIS — I10 Essential (primary) hypertension: Secondary | ICD-10-CM

## 2023-03-14 MED ORDER — ALBUTEROL SULFATE HFA 108 (90 BASE) MCG/ACT IN AERS: INHALATION_SPRAY | RESPIRATORY_TRACT | 3 refills | Status: AC

## 2023-03-14 MED ORDER — FLUTICASONE PROPIONATE 50 MCG/ACT NA SUSP: NASAL | 3 refills | Status: AC

## 2023-03-14 MED ORDER — AMLODIPINE BESYLATE 5 MG PO TABS: 5.0000 mg | ORAL_TABLET | Freq: Every day | ORAL | 2 refills | Status: DC

## 2023-03-14 MED ORDER — LISINOPRIL-HYDROCHLOROTHIAZIDE 20-12.5 MG PO TABS: 1.0000 | ORAL_TABLET | Freq: Every day | ORAL | 2 refills | Status: DC

## 2023-03-14 MED ORDER — FLUOXETINE HCL 20 MG PO CAPS: 20.0000 mg | ORAL_CAPSULE | Freq: Every day | ORAL | 2 refills | Status: DC

## 2023-03-14 NOTE — Progress Notes (Signed)
Established Patient Office Visit  Subjective   [name], female    DOB: 1950-10-28  Age: 73 y.o. MRN: [id number]  Chief Complaint  Patient presents with   Medication Refill   Asthma    Patient is a 73 y.o. with a past medical history stated below who presents today for follow-up for HTN, osteopenia, and healthcare maintenance. She was last seen at [facility] on 12/19/2022. Please see problem based assessment and plan for additional details.     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   Cardiovascular:  Negative for chest pain, palpitations and leg swelling.  Gastrointestinal:  Negative for abdominal pain, nausea and vomiting.  Neurological:  Negative for headaches.     Objective:     BP (!) 157/91 (BP Location: Left Arm, Patient Position: Sitting, Cuff Size: Small)   Pulse 67   Temp 97.9 F (36.6  C) (Oral)   Ht 5\' 5"  (1.651 m)   Wt 184 lb (83.5 kg)   SpO2 100%   BMI 30.62 kg/m  BP Readings from Last 3 Encounters:  03/14/23 (!) 157/91  02/20/23 131/89  01/23/23 (!) 160/94   Wt Readings from Last 3 Encounters:  03/14/23 184 lb (83.5 kg)  01/23/23 173 lb 12.8 oz (78.8 kg)  12/31/22 170 lb (77.1 kg)   Physical Exam Constitutional:      Appearance: Normal appearance.  HENT:     Head: Normocephalic and atraumatic.  Cardiovascular:     Rate and Rhythm: Normal rate and regular rhythm.     Heart sounds: Normal heart sounds.  Pulmonary:     Effort: Pulmonary effort is normal.     Breath sounds: Normal breath sounds.  Abdominal:     General: Bowel sounds are normal.     Palpations: Abdomen is soft.  Musculoskeletal:        General: Normal range of motion.  Skin:    General: Skin is warm and dry.  Neurological:     General: No focal deficit present.     Mental Status: She is alert.  Psychiatric:        Mood and Affect: Mood normal.        Behavior: Behavior normal.    No results found for any visits on 03/14/23.  The 10-year ASCVD risk score (Arnett DK, et al., 2019) is: 17.1%    Assessment & Plan:   Problem List Items Addressed This Visit     Essential hypertension (Chronic)   Patient's BP 167/91 today, however patient reports she has not taken her BP medications as she left home early to come to today's appointment. Denies chest pain, shortness of breath, palpitations, headache, or vision changes. Able to measure BP at home, per patient last BP measurement was 130/75. Currently taking amlodipine 5 mg daily and lisinopril-hydrochlorothiazide 20-12.5 mg daily. Tolerating well. CV exam unremarkable today, no LE swelling. Refilled medications today, reminded patient to bring in BP log from home to next visit.  Plan - Continue amlodipine 5 mg daily and lisinopril-hydrochlorothiazide 20-12.5 mg daily - Return to clinic in 3 months, bring home BP log - BMP next visit        Relevant Medications   amLODipine (NORVASC) 5 MG tablet   lisinopril-hydrochlorothiazide (ZESTORETIC) 20-12.5 MG tablet   Depression (Chronic)   Relevant Medications   FLUoxetine (PROZAC) 20 MG capsule   Healthcare maintenance (Chronic)   Patient has been following up with Dr. Berton Lan for gynecology concerns due to atypical high grade squamous intraepithelial lesion. Per chart review and patient, patient ok to repeat pap smear in 1 year.       Seasonal allergic rhinitis due to pollen (Chronic)   Relevant Medications   fluticasone (FLONASE) 50 MCG/ACT nasal spray   Asthma   Relevant Medications   albuterol (VENTOLIN HFA) 108 (90 Base) MCG/ACT inhaler   Osteopenia - Primary   Patient with recent DEXA scan indicating findings consistent with osteopenia. Denies any fractures or falls. Discussed need to supplement Vitamin D and Vitamin C levels. Will get Vitamin D labs today and follow up as needed.  Plan - Vitamin D levels checked today, will follow up with patient about recommended daily vs weekly supplementation      Relevant Orders   Vitamin D (25 hydroxy)   Patient discussed with Dr. Lafonda Mosses.  Return in about 3 months (around 06/11/2023) for hypertension, osteopenia, healthcare maintenance .   Olivia Ottey Colbert Coyer, MD

## 2023-03-14 NOTE — Patient Instructions (Signed)
Thank you, [name] for allowing [name] to provide your care today. Today we discussed your blood pressure, your medication refills, paperwork, and your recent DEXA scan.   I have ordered the following labs for you:  Lab Orders         Vitamin D (25 hydroxy)       Tests ordered today:  None  Referrals ordered today:   Referral Orders  No referral(s) requested today     I have ordered the following medication/changed the following medications:   Stop the following medications: Medications Discontinued During This Encounter  Medication Reason   amLODipine (NORVASC) 5 MG tablet Reorder   lisinopril-hydrochlorothiazide (ZESTORETIC) 20-12.5 MG tablet Reorder   fluticasone (FLONASE) 50 MCG/ACT nasal spray Reorder   FLUoxetine (PROZAC) 20 MG capsule Reorder   albuterol (VENTOLIN HFA) 108 (90 Base) MCG/ACT inhaler Reorder     Start the following medications: Meds ordered this encounter  Medications   FLUoxetine (PROZAC) 20 MG capsule    Sig: Take 1 capsule (20 mg total) by mouth daily.    Dispense:  30 capsule    Refill:  2   amLODipine (NORVASC) 5 MG tablet    Sig: Take 1 tablet (5 mg total) by mouth daily.    Dispense:  90 tablet    Refill:  2    Maximum Refills Reached   lisinopril-hydrochlorothiazide (ZESTORETIC) 20-12.5 MG tablet    Sig: Take 1 tablet by mouth daily.    Dispense:  90 tablet    Refill:  2    Maximum Refills Reached   albuterol (VENTOLIN HFA) 108 (90 Base) MCG/ACT inhaler    Sig: INHALE 1 TO 2 PUFFS INTO THE LUNGS EVERY 6 HOURS AS NEEDED FOR SHORTNESS OF BREATH    Dispense:  8.5 g    Refill:  3    Maximum Refills Reached   fluticasone (FLONASE) 50 MCG/ACT nasal spray    Sig: USE 1 OR 2 SPRAYS IN EACH NOSTRIL DAILY    Dispense:  16 g    Refill:  3    Maximum Refills Reached     Follow up: 3 months for hypertension, osteopenia, healthcare maintenance    Remember:   - I will call you with the results of your vitamin D lab and let you  know how much Vitamin D and C to take.   Should you have any questions or concerns please call the internal medicine clinic at [phone].     Olivia Valera Colbert Coyer, MD PGY-1 Internal Medicine Teaching Prog[facility]

## 2023-03-17 ENCOUNTER — Other Ambulatory Visit: Payer: 59

## 2023-03-17 DIAGNOSIS — M8580 Other specified disorders of bone density and structure, unspecified site: Secondary | ICD-10-CM | POA: Insufficient documentation

## 2023-03-17 NOTE — Addendum Note (Signed)
Addended by: Derrek Monaco on: 03/17/2023 10:06 AM   Modules accepted: Level of Service

## 2023-03-17 NOTE — Assessment & Plan Note (Addendum)
Patient's BP 167/91 today, however patient reports she has not taken her BP medications as she left home early to come to today's appointment. Denies chest pain, shortness of breath, palpitations, headache, or vision changes. Able to measure BP at home, per patient last BP measurement was 130/75. Currently taking amlodipine 5 mg daily and lisinopril-hydrochlorothiazide 20-12.5 mg daily. Tolerating well. CV exam unremarkable today, no LE swelling. Refilled medications today, reminded patient to bring in BP log from home to next visit.  Plan - Continue amlodipine 5 mg daily and lisinopril-hydrochlorothiazide 20-12.5 mg daily - Return to clinic in 3 months, bring home BP log - BMP next visit

## 2023-03-17 NOTE — Progress Notes (Signed)
 Internal Medicine Clinic Attending  Case discussed with the resident at the time of the visit.  We reviewed the resident's history and exam and pertinent patient test results.  I agree with the assessment, diagnosis, and plan of care documented in the resident's note.

## 2023-03-17 NOTE — Assessment & Plan Note (Signed)
Patient with recent DEXA scan indicating findings consistent with osteopenia. Denies any fractures or falls. Discussed need to supplement Vitamin D and Vitamin C levels. Will get Vitamin D labs today and follow up as needed.  Plan - Vitamin D levels checked today, will follow up with patient about recommended daily vs weekly supplementation

## 2023-03-17 NOTE — Addendum Note (Signed)
Addended by: Derrek Monaco on: 03/17/2023 10:02 AM   Modules accepted: Level of Service

## 2023-03-17 NOTE — Assessment & Plan Note (Signed)
Patient has been following up with Dr. Berton Lan for gynecology concerns due to atypical high grade squamous intraepithelial lesion. Per chart review and patient, patient ok to repeat pap smear in 1 year.

## 2023-03-19 ENCOUNTER — Other Ambulatory Visit: Payer: 59

## 2023-03-24 ENCOUNTER — Other Ambulatory Visit: Payer: 59

## 2023-03-24 DIAGNOSIS — E559 Vitamin D deficiency, unspecified: Secondary | ICD-10-CM | POA: Diagnosis not present

## 2023-03-24 DIAGNOSIS — M8580 Other specified disorders of bone density and structure, unspecified site: Secondary | ICD-10-CM | POA: Diagnosis not present

## 2023-03-25 LAB — VITAMIN D 25 HYDROXY (VIT D DEFICIENCY, FRACTURES): Vit D, 25-Hydroxy: 27.6 ng/mL — ABNORMAL LOW (ref 30.0–100.0)

## 2023-03-26 ENCOUNTER — Telehealth: Payer: Self-pay

## 2023-03-26 MED ORDER — VITAMIN D 25 MCG (1000 UNIT) PO TABS: 1000.0000 [IU] | ORAL_TABLET | Freq: Every day | ORAL | 3 refills | Status: AC

## 2023-03-26 NOTE — Telephone Encounter (Signed)
Pt is requesting a call back about her VIT D .Marland Kitchen She stated that she got the results in her my chart but she is concerned  about what to take

## 2023-03-26 NOTE — Telephone Encounter (Signed)
Sent in vitamin D supplement for the patient. Called about results.

## 2023-03-27 ENCOUNTER — Other Ambulatory Visit (INDEPENDENT_AMBULATORY_CARE_PROVIDER_SITE_OTHER): Payer: 59

## 2023-03-27 ENCOUNTER — Ambulatory Visit: Payer: 59 | Admitting: Physician Assistant

## 2023-03-27 DIAGNOSIS — M1712 Unilateral primary osteoarthritis, left knee: Secondary | ICD-10-CM

## 2023-03-27 MED ORDER — ACETAMINOPHEN-CODEINE 300-30 MG PO TABS: 1.0000 | ORAL_TABLET | Freq: Two times a day (BID) | ORAL | 0 refills | Status: DC | PRN

## 2023-03-27 MED ORDER — METHYLPREDNISOLONE ACETATE 40 MG/ML IJ SUSP
40.0000 mg | INTRAMUSCULAR | Status: AC | PRN
Start: 2023-03-27 — End: 2023-03-27
  Administered 2023-03-27: 40 mg via INTRA_ARTICULAR

## 2023-03-27 MED ORDER — LIDOCAINE HCL 1 % IJ SOLN
2.0000 mL | INTRAMUSCULAR | Status: AC | PRN
  Administered 2023-03-27: 2 mL

## 2023-03-27 MED ORDER — BUPIVACAINE HCL 0.25 % IJ SOLN
2.0000 mL | INTRAMUSCULAR | Status: AC | PRN
Start: 2023-03-27 — End: 2023-03-27
  Administered 2023-03-27: 2 mL via INTRA_ARTICULAR

## 2023-03-27 NOTE — Progress Notes (Addendum)
Office Visit Note   Patient: Olivia Ewing           Date of Birth: [birth year]           MRN: [id number] Visit Date: 03/27/2023              Requested by: Masters, [place], DO [address] [place],  [place] [address] PCP: Rudene Christians, DO   Assessment & Plan: Visit Diagnoses:  1. Primary osteoarthritis of left knee     Plan: Impression is left knee osteoarthritis.  Today, we discussed various treatment options to include repeat cortisone injection for which she would like to proceed.  We have also discussed getting a custom medial unloader brace for the DJD and underlying instability.  She will follow-up with [name] as needed.  Call with concerns or questions.  Follow-Up Instructions: Return if symptoms worsen or fail to improve.   Orders:  Orders Placed This Encounter  Procedures   Large Joint Inj   XR KNEE 3 VIEW LEFT   Meds ordered this encounter  Medications   acetaminophen-codeine (TYLENOL #3) 300-30 MG tablet    Sig: Take 1-2 tablets by mouth 2 (two) times daily as needed.    Dispense:  30 tablet    Refill:  0      Procedures: Large Joint Inj: L knee on 03/27/2023 9:29 AM Indications: pain Details: 22 G needle, anterolateral approach Medications: 2 mL lidocaine 1 %; 2 mL bupivacaine 0.25 %; 40 mg methylPREDNISolone acetate 40 MG/ML      Clinical Data: No additional findings.   Subjective: Chief Complaint  Patient presents with   Left Knee - Pain    HPI [name] is a pleasant 73 year old female who comes in today with recurrent left knee pain.  Pain she has is primarily to the medial aspect and is worse with flexion of the knee and with walking.  She has associated swelling.  She has been taking Tylenol without significant relief.  She was seen in our office in early October last year where cortisone injection was performed.  She had great relief until recently.  Review of Systems as detailed in HPI.  All others reviewed and are  negative.   Objective: Vital Signs: There were no vitals taken for this visit.  Physical Exam well-developed well-nourished female no acute distress.  Alert and oriented x 3.  Ortho Exam left knee exam: Small effusion.  Range of motion 0 to 90 degrees.  Medial joint line tenderness.  Slight instability with varus stress.  She is neurovascularly intact distally.  Specialty Comments:  CLINICAL DATA:  Lumbar radiculopathy, symptoms persist with > 6 wks treatment   EXAM: MRI LUMBAR SPINE WITHOUT CONTRAST   TECHNIQUE: Multiplanar, multisequence MR imaging of the lumbar spine was performed. No intravenous contrast was administered.   COMPARISON:  MRI lumbar spine December 30, 2018.   FINDINGS: Segmentation: Standard segmentation is assumed. The inferior-most fully formed intervertebral disc labeled L5-S1.   Alignment:  No substantial sagittal subluxation.   Vertebrae: Degenerative/discogenic endplate signal changes at multiple levels. No specific evidence of acute fracture, discitis/osteomyelitis, or suspicious bone lesion.   Conus medullaris and cauda equina: Conus extends to the L1-L2 level. Conus appears normal.   Paraspinal and other soft tissues: Partially imaged left renal cyst.   Disc levels:   T12-L1: No significant disc protrusion, foraminal stenosis, or canal stenosis.   L1-L2: Broad disc bulging with superimposed left subarticular disc protrusion. Bilateral facet hypertrophy and ligamentum flavum thickening.  Resulting moderate to severe left subarticular recess stenosis with potential for impingement. Mild central canal stenosis and mild foraminal stenosis.   L2-L3: Disc bulging and endplate spurring. Left greater than right facet arthropathy. Ligamentum flavum thickening. Resulting mild-to-moderate left and mild right foraminal stenosis. Patent central canal.   L3-L4: Broad disc bulge. Evidence of prior postoperative changes posteriorly. Bilateral facet  arthropathy. Moderate right and severe left foraminal stenosis, which is progressed. Patent central canal.   L4-L5: Postoperative changes posteriorly. Broad disc bulging. Bilateral facet arthropathy. Severe left and moderate right foraminal stenosis, progressed. Patent central canal.   L5-S1: Disc bulging and endplate spurring with superimposed small central disc protrusion. Resulting moderate right subarticular recess stenosis. Mild central canal stenosis. Mild left foraminal stenosis.   IMPRESSION: 1. 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 Canal stenosis is improved at L4-L5, now patent. 2. At L1-L2, progressive moderate to severe left subarticular recess stenosis. 3. At L5-S1, progressive moderate right subarticular recess narrowing. Mild central canal and left foraminal stenosis. 4. At L2-L3, mild to moderate left and mild right foraminal stenosis.     Electronically Signed   By: Feliberto Harts M.D.   On: 07/05/2022 08:52  Imaging: XR KNEE 3 VIEW LEFT Result Date: 03/27/2023 X-rays demonstrate advanced tricompartmental degenerative changes    PMFS History: Patient Active Problem List   Diagnosis Date Noted   Osteopenia 03/17/2023   H/O Whipple procedure 12/19/2022   Polyp of duodenum 04/03/2022   Nausea 01/29/2022   Change in bowel habits 01/29/2022   Primary osteoarthritis of left knee 11/29/2021   Trigger finger, right ring finger 04/10/2021   Trigger finger, left ring finger 02/26/2021   Asthma 12/13/2020   GERD (gastroesophageal reflux disease) 12/13/2020   Housing instability 12/13/2020   Atypical squamous cells cannot exclude high grade squamous intraepithelial lesion on cytologic smear of cervix (ASC-H) 09/01/2020   Vaginal itching 09/01/2020   De Quervain's disease (tenosynovitis) 08/22/2020   Insomnia 06/08/2020   Medication monitoring encounter 02/25/2020   S/P arthroscopy of right shoulder  01/05/2020   Carpal tunnel syndrome, left upper limb 10/25/2019    Class: Chronic   Carpal tunnel syndrome, right upper limb 10/25/2019    Class: Chronic   Tendinopathy of right biceps tendon 07/27/2019   Breast cancer screening by mammogram 06/07/2019   Seasonal allergic rhinitis due to pollen 06/07/2019   Colon cancer screening 06/07/2019   Lumbar stenosis with neurogenic claudication 03/16/2019    Class: Chronic   Acute stress reaction 09/25/2018   Vitamin D insufficiency 11/25/2013   Healthcare maintenance 11/25/2013   Depression 09/23/2011   Functional incontinence 10/18/2010   Pain in right leg 10/18/2010   Iron deficiency anemia 05/12/2008   Hyperlipemia 06/18/2007   Essential hypertension 05/20/2007   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   Steroid injections: 09/2013 12/2013  Lumbar radiculopathy, chronic    Nontraumatic incomplete tear of right rotator cuff 07/27/2019   Primary osteoarthritis of right hip 04/27/2018   Right knee meniscal tear 03/14/2011   Right knee pain    posterior horn medial meniscal tear MRI 2013   Septic arthritis (HCC) 02/25/2020   Spinal stenosis    getting epidural injections -last one 06/12/2015   Status post lumbar laminectomy 03/16/2019   Status post total hip replacement, 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    Family History  Problem Relation Age of Onset   Hypertension Mother    Colon cancer Neg Hx    Esophageal cancer Neg Hx    Rectal cancer  Neg Hx    Stomach cancer Neg Hx     Past Surgical History:  Procedure Laterality Date   APPENDECTOMY     Bladder tack  10/13/2006   cystocoele   BREAST SURGERY Right    benign cyst   CARPAL TUNNEL RELEASE Left 10/25/2019   Procedure: LEFT CARPAL TUNNEL RELEASE;  Surgeon: Kerrin Champagne, MD;  Location: [facility];  Service: Orthopedics;  Laterality: Left;   COLONOSCOPY     IRRIGATION AND DEBRIDEMENT SHOULDER Right 02/21/2020   Procedure: IRRIGATION AND DEBRIDEMENT RIGHT SHOULDER;  Surgeon: Tarry Kos, MD;  Location: [facility];  Service: Orthopedics;  Laterality: Right;   LUMBAR LAMINECTOMY N/A 03/16/2019   Procedure: CENTRAL LAMINECTOMIES L2-3, L3-4 AND L4-5;  Surgeon: Kerrin Champagne, MD;  Location: [facility];  Service: Orthopedics;  Laterality: N/A;   ROTATOR CUFF REPAIR     TOTAL HIP ARTHROPLASTY Right 04/27/2018   Procedure: RIGHT TOTAL HIP ARTHROPLASTY ANTERIOR APPROACH;  Surgeon: Tarry Kos, MD;  Location: [facility];  Service: Orthopedics;  Laterality: Right;   TUBAL LIGATION     VAGINAL HYSTERECTOMY  2008   Social History   Occupational History   Not on file  Tobacco Use   Smoking status: Former    Current packs/day: 0.00    Types: Cigarettes    Quit date: 11/26/1983    Years since quitting: 39.3    Passive exposure: Never   Smokeless tobacco: Never  Vaping Use   Vaping status: Never Used  Substance and Sexual Activity   Alcohol use: Yes    Alcohol/week: 0.0 standard drinks of alcohol    Comment: rarely   Drug use: Not Currently    Types: Marijuana    Comment: last used 2019   Sexual activity: Yes    Birth control/protection: Surgical

## 2023-04-01 ENCOUNTER — Other Ambulatory Visit: Payer: Self-pay | Admitting: Internal Medicine

## 2023-04-01 DIAGNOSIS — F32A Depression, unspecified: Secondary | ICD-10-CM

## 2023-04-03 DIAGNOSIS — M1712 Unilateral primary osteoarthritis, left knee: Secondary | ICD-10-CM | POA: Diagnosis not present

## 2023-04-11 ENCOUNTER — Other Ambulatory Visit (HOSPITAL_COMMUNITY): Payer: Self-pay | Admitting: Internal Medicine

## 2023-04-11 ENCOUNTER — Ambulatory Visit: Payer: Self-pay | Admitting: Licensed Clinical Social Worker

## 2023-04-11 DIAGNOSIS — Z1231 Encounter for screening mammogram for malignant neoplasm of breast: Secondary | ICD-10-CM

## 2023-04-11 NOTE — Patient Outreach (Signed)
 Care Coordination   Follow Up Visit Note   04/11/2023 Name: Olivia Ewing MRN: [id number] DOB: [birth year]  Olivia Ewing is a 73 y.o. year old female who sees Masters, Florentina Addison, DO for primary care. I spoke with  Jonathon Jordan by phone today.  What matters to the patients health and wellness today?  Continuing to better manage my symptoms of anxiety, improve access to housing.    Goals Addressed             This Visit's Progress    COMPLETED: Increase access to housing       Care Coordination Interventions: Assessed Social Determinants of Health Reviewed all upcoming appointments in Epic system Solution-Focused Strategies employed:  Active listening / Reflection utilized  Emotional Support Provided Look out for call back from [employer] regarding senior housing list and ask for list housing options in her area Visit housing authority in person for housing list/assistance Good job contacting housing authority to be put on waitlist for senior high rise apartment [employer] in 1 month to request place on waitlist Continue Volunteering with church and at hospital           SDOH assessments and interventions completed:  Yes     Care Coordination Interventions:  Yes, provided   Interventions Today    Flowsheet Row Most Recent Value  Chronic Disease   Chronic disease during today's visit Other  [Anxiety]  General Interventions   General Interventions Discussed/Reviewed General Interventions Discussed, [employer] has not been able to get i touch with [place] at [employer] since previous phone call. CSW agreed to attempt contact and left VM.]  Mental Health Interventions   Mental Health Discussed/Reviewed Mental Health Discussed, Mental Health Reviewed, Anxiety, Coping Strategies  [Pt reported that she has started going to her church again and  will be baptized this April. 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 Voluteering has helped her mood greatly and she feels like she has purpose again.]        Follow up plan: Follow up call scheduled for 04/29/2023    Encounter Outcome:  Patient Visit Completed   Kenton Kingfisher, LCSW [facility], [facility] Licensed Clinical Social Worker Care Coordinator [phone]

## 2023-04-14 ENCOUNTER — Other Ambulatory Visit: Payer: 59

## 2023-04-16 ENCOUNTER — Telehealth: Payer: Self-pay | Admitting: *Deleted

## 2023-04-16 NOTE — Telephone Encounter (Signed)
 Communication  Reason for CRM: Patient is needing a print out of her medication list to provide for where she volunteers. She will be by the office on Thursday if it could be available for her at the front desk to pick up.

## 2023-04-16 NOTE — Telephone Encounter (Signed)
 Pt can pick up copy of medication list. Pt was called / informed.

## 2023-04-29 ENCOUNTER — Other Ambulatory Visit: Payer: Self-pay | Admitting: Physician Assistant

## 2023-04-29 ENCOUNTER — Other Ambulatory Visit: Payer: Self-pay

## 2023-04-29 ENCOUNTER — Telehealth: Payer: Self-pay | Admitting: Physician Assistant

## 2023-04-29 DIAGNOSIS — M1712 Unilateral primary osteoarthritis, left knee: Secondary | ICD-10-CM

## 2023-04-29 MED ORDER — ACETAMINOPHEN-CODEINE 300-30 MG PO TABS: 1.0000 | ORAL_TABLET | Freq: Two times a day (BID) | ORAL | 1 refills | Status: DC | PRN

## 2023-04-29 NOTE — Telephone Encounter (Signed)
 Sent in a refill, but if she needs to continue will need to refer to pain mgmt

## 2023-04-29 NOTE — Telephone Encounter (Signed)
 Called patient. Made referral to pain management.

## 2023-04-29 NOTE — Telephone Encounter (Signed)
 Pt called requesting a prescription of tylenol 3 for left knee pains. She states her knee is starting to bother her again.Please send to [facility]. Pt phone number is [phone].

## 2023-05-06 ENCOUNTER — Telehealth: Payer: Self-pay

## 2023-05-06 ENCOUNTER — Telehealth: Payer: Self-pay | Admitting: Orthopaedic Surgery

## 2023-05-06 NOTE — Telephone Encounter (Signed)
 Copied from CRM [id number]. Topic: General - Other >> May 06, 2023  9:50 AM Antony Haste wrote: Reason for CRM: The patient states she dropped of paperwork at the office pertaining to transportation for [employer] to be approved to ride the [employer]. She states this approval will benefit her because riding the regular bus may interfere with her capabilities after having back surgery and hip replacement. She states sometimes the regular city bus does not have a lift to assist with walking up the steps and she has trouble since she uses a walker as well. She is requesting for Dr. Sloan Leiter to sign this paperwork as soon as possible.  Callback [phone]

## 2023-05-06 NOTE — Telephone Encounter (Signed)
 I called and spoke with pt regarding [employer] transportation application. Inform pt that I did placed the transportation forms in the doctor box, waiting for the doctor to complete. Once forms have been completed, I will call her back.

## 2023-05-07 ENCOUNTER — Telehealth: Payer: Self-pay | Admitting: Physical Medicine and Rehabilitation

## 2023-05-07 NOTE — Telephone Encounter (Signed)
 disregard

## 2023-05-20 ENCOUNTER — Telehealth: Payer: Self-pay

## 2023-05-20 NOTE — Telephone Encounter (Signed)
 Copied from CRM [id number]. Topic: General - Other >> May 20, 2023  2:18 PM Everette Rank wrote: Reason for CRM: Note: The patient states she dropped of paperwork at the office pertaining to transportation for [employer] to be approved to ride the [employer]. She states this approval will benefit her because riding the regular bus may interfere with her capabilities after having back surgery and hip replacement. She states sometimes the regular city bus does not have a lift to assist with walking up the steps and she has trouble since she uses a walker as well. She is requesting for Dr. Sloan Leiter to sign this paperwork as soon as possible.   Patient called on paperwork to pick up. Will not be able to pick up till 04/11 in the morning. Just Fyi for office. Nay questions call [phone]

## 2023-05-21 ENCOUNTER — Ambulatory Visit: Payer: Self-pay

## 2023-05-21 NOTE — Telephone Encounter (Signed)
 Chief Complaint: Body Aches Symptoms: Pain Frequency: Worsening x 3 weeks Pertinent Negatives: Patient denies Cp, SOB, fever Disposition: [] ED /[] Urgent Care (no appt availability in office) / [x] Appointment(In office/virtual)/ []  [facility]/ [] Home Care/ [] Refused Recommended Disposition /[] [facility]/ []  Follow-up with PCP Additional Notes: Pt reports she has had ongoing generalized body pain, notes she recently had a bone scan. Pt notes worsening pain over the last 3 weeks. Advised OV today for pain management, pt unable due to babysitting her granddaughter. Virtual OV scheduled Friday AM. This RN educated pt on home care, new-worsening symptoms, when to call back/seek emergent care. Pt verbalized understanding and agrees to plan.    Copied from CRM [id number]. Topic: Clinical - Red Word Triage >> May 21, 2023 12:00 PM Corin V wrote: [facility] that prompted transfer to Nurse Triage: Patient is in pain, rating a 9/10. Pain is all over body.   She also has questions about the bone density scan from January. Reason for Disposition  [1] SEVERE pain (e.g., excruciating, unable to do any normal activities) AND [2] not improved 2 hours after pain medicine  Answer Assessment - Initial Assessment Questions 1. ONSET: "When did the muscle aches or body pains start?"      Worsening x 2 weeks 2. LOCATION: "What part of your body is hurting?" (e.g., entire body, arms, legs)      Knees, legs, shoulder, back, hands 3. SEVERITY: "How bad is the pain?" (Scale 1-10; or mild, moderate, severe)   - MILD (1-3): doesn't interfere with normal activities    - MODERATE (4-7): interferes with normal activities or awakens from sleep    - SEVERE (8-10):  excruciating pain, unable to do any normal activities      9/10 4. CAUSE: "What do you think is causing the pains?"     Unknown 5. FEVER: "Have you been having fever?"     None 6. OTHER SYMPTOMS: "Do you have any other symptoms?" (e.g.,  chest pain, weakness, rash, cold or flu symptoms, weight loss)     None  Protocols used: Muscle Aches and Body Pain-A-AH

## 2023-05-21 NOTE — Telephone Encounter (Signed)
 Forms has been completed and ready for pick-up. Pt has been notified.

## 2023-05-21 NOTE — Telephone Encounter (Addendum)
 I spoke with pt yesterday to informed her the paperwork for [employer] transportation application has been completed and ready for pick up. Told pt will be left at the front desk for pick up. Attempts called to pt 2X today at the number provided, no answer and unable to lvm.

## 2023-05-22 ENCOUNTER — Encounter: Payer: Self-pay | Admitting: Physical Medicine & Rehabilitation

## 2023-05-23 ENCOUNTER — Other Ambulatory Visit: Payer: Self-pay | Admitting: Internal Medicine

## 2023-05-23 ENCOUNTER — Ambulatory Visit: Admitting: Family Medicine

## 2023-05-23 ENCOUNTER — Ambulatory Visit: Admitting: Student

## 2023-05-23 ENCOUNTER — Other Ambulatory Visit: Payer: Self-pay

## 2023-05-23 ENCOUNTER — Encounter: Payer: Self-pay | Admitting: Family Medicine

## 2023-05-23 VITALS — BP 136/88 | Ht 66.0 in | Wt 170.0 lb

## 2023-05-23 DIAGNOSIS — M25562 Pain in left knee: Secondary | ICD-10-CM | POA: Diagnosis not present

## 2023-05-23 MED ORDER — HYDROCODONE-ACETAMINOPHEN 7.5-325 MG PO TABS: 1.0000 | ORAL_TABLET | Freq: Four times a day (QID) | ORAL | 0 refills | Status: DC | PRN

## 2023-05-23 MED ORDER — KETOROLAC TROMETHAMINE 60 MG/2ML IM SOLN
60.0000 mg | Freq: Once | INTRAMUSCULAR | Status: AC
  Administered 2023-05-23: 60 mg via INTRA_ARTICULAR

## 2023-05-23 NOTE — Addendum Note (Signed)
 Addended by: Andi Devon on: 05/23/2023 12:03 PM   Modules accepted: Level of Service

## 2023-05-23 NOTE — Telephone Encounter (Signed)
 Patient has an appointment scheduled at 9:15 AM this morning, as a virtual visit, however she did not come online.  I called her at 10 AM this morning, patient reports that she is standing outside the orthopedics office to receive cortisone shot in her left knee.  Patient reports that she has a appointment scheduled at the pain management clinic on May.  I advised the patient that if she wants to follow-up with [name] for her worsening pain, she would need to schedule an appointment to see [name].  I offered an appointment day on upcoming Tuesday, patient reports that she wants to call back if she needs an appointment.

## 2023-05-23 NOTE — Progress Notes (Signed)
 Subjective:   HPI:  Patient is a 73 y.o. female presenting with worsening left knee pain over the past 6 months, attributed to known osteoarthritis. Pain is aggravated by weight-bearing and stair climbing, with significant limitation in knee flexion. She reports difficulty sleeping due to the discomfort. Tylenol 3, gabapentin, OTC medications, and hot/cold compresses have provided minimal relief. Last intra-articular injection was 2 months ago with no lasting benefit. Denies numbness, tingling, or radiation of pain. Medical history notable for spinal stenosis, right hip replacement, and prior Whipple procedure.   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   Steroid injections: 09/2013 12/2013    Lumbar radiculopathy, chronic    Nontraumatic incomplete tear of right rotator cuff 07/27/2019   Primary osteoarthritis of right hip 04/27/2018   Right knee meniscal tear 03/14/2011   Right knee pain    posterior horn medial meniscal tear MRI 2013   Septic arthritis (HCC) 02/25/2020   Spinal stenosis    getting epidural injections -last one 06/12/2015   Status post lumbar laminectomy 03/16/2019   Status post total hip replacement, 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 30 tablet 0   acetaminophen-codeine (TYLENOL #3)  300-30 MG tablet Take 1 tablet by mouth 2 (two) times daily as needed for moderate pain (pain score 4-6). 20 tablet 1   albuterol (VENTOLIN HFA) 108 (90 Base) MCG/ACT inhaler INHALE 1 TO 2 PUFFS INTO THE LUNGS EVERY 6 HOURS AS NEEDED FOR SHORTNESS OF BREATH 8.5 g 3   amLODipine (NORVASC) 5 MG tablet Take 1 tablet (5 mg total) by mouth daily. 90 tablet 2   cholecalciferol (VITAMIN D3) 25 MCG (1000 UNIT) tablet Take 1 tablet (1,000 Units total) by mouth daily. 30 tablet 3   COD LIVER OIL PO Take by mouth.     famotidine (PEPCID) 20 MG tablet TAKE 1 TABLET BY MOUTH TWICE A DAY (Patient not taking: Reported on 01/23/2023) 60 tablet 2   ferrous gluconate (FERGON) 324 MG tablet Take 1 tablet (324 mg total) by mouth daily with breakfast. 60 tablet 1   FLUoxetine (PROZAC) 20 MG capsule Take 1 capsule (20 mg total) by mouth daily. 30 capsule 2   fluticasone (FLONASE) 50 MCG/ACT nasal spray USE 1 OR 2 SPRAYS IN EACH NOSTRIL DAILY 16 g 3   fluticasone (FLOVENT HFA) 44 MCG/ACT inhaler Inhale 1 puff into the lungs 2 (two) times daily. 1 each 2   gabapentin (NEURONTIN) 600 MG tablet Take 1 tablet (600 mg total) by mouth 3 (three) times daily. 90 tablet 11   ibuprofen (ADVIL) 800 MG tablet Take 1 tablet (800 mg total) by mouth every 8 (eight) hours as needed. (Patient not taking: Reported on 01/23/2023) 12 tablet 0  lisinopril-hydrochlorothiazide (ZESTORETIC) 20-12.5 MG tablet Take 1 tablet by mouth daily. 90 tablet 2   loratadine (CLARITIN) 10 MG tablet Take 1 tablet (10 mg total) by mouth daily. 30 tablet 0   metoCLOPramide (REGLAN) 5 MG tablet Take 1 tablet (5 mg total) by mouth 3 (three) times daily as needed for nausea. (Patient not taking: Reported on 01/23/2023) 90 tablet 1   Multiple Vitamin (MULTIVITAMIN WITH MINERALS) TABS tablet Take 1 tablet by mouth daily.     omeprazole (PRILOSEC) 40 MG capsule Take 1 capsule (40 mg total) by mouth in the morning and at bedtime. (Patient not taking: Reported on  01/23/2023) 60 capsule 3   promethazine (PHENERGAN) 25 MG tablet Take 1 tablet (25 mg total) by mouth every 8 (eight) hours as needed. (Patient not taking: Reported on 01/23/2023) 30 tablet 0   QUEtiapine (SEROQUEL) 100 MG tablet Take 1 tablet (100 mg total) by mouth at bedtime. 90 tablet 3   rosuvastatin (CRESTOR) 20 MG tablet Take 1 tablet (20 mg total) by mouth daily. 90 tablet 3   sodium chloride (OCEAN) 0.65 % SOLN nasal spray Place 1 spray into both nostrils 2 (two) times daily as needed. 30 mL 0   sucralfate (CARAFATE) 1 GM/10ML suspension Take 10 mLs (1 g total) by mouth every 6 (six) hours as needed. (Patient not taking: Reported on 01/23/2023) 420 mL 1   No current facility-administered medications on file prior to visit.    Past Surgical History:  Procedure Laterality Date   APPENDECTOMY     Bladder tack  10/13/2006   cystocoele   BREAST SURGERY Right    benign cyst   CARPAL TUNNEL RELEASE Left 10/25/2019   Procedure: LEFT CARPAL TUNNEL RELEASE;  Surgeon: Kerrin Champagne, MD;  Location: [facility];  Service: Orthopedics;  Laterality: Left;   COLONOSCOPY     IRRIGATION AND DEBRIDEMENT SHOULDER Right 02/21/2020   Procedure: IRRIGATION AND DEBRIDEMENT RIGHT SHOULDER;  Surgeon: Tarry Kos, MD;  Location: [facility];  Service: Orthopedics;  Laterality: Right;   LUMBAR LAMINECTOMY N/A 03/16/2019   Procedure: CENTRAL LAMINECTOMIES L2-3, L3-4 AND L4-5;  Surgeon: Kerrin Champagne, MD;  Location: [facility];  Service: Orthopedics;  Laterality: N/A;   ROTATOR CUFF REPAIR     TOTAL HIP ARTHROPLASTY Right 04/27/2018   Procedure: RIGHT TOTAL HIP ARTHROPLASTY ANTERIOR APPROACH;  Surgeon: Tarry Kos, MD;  Location: [facility];  Service: Orthopedics;  Laterality: Right;   TUBAL LIGATION     VAGINAL HYSTERECTOMY  2008    Allergies  Allergen Reactions   Tramadol Other (See Comments)    Hallucinate, nightmare     BP 136/88   Ht 5\' 6"  (1.676 m)   Wt 170 lb (77.1  kg)   BMI 27.44 kg/m       No data to display              No data to display              Objective:  Physical Exam:  Gen: NAD, comfortable in exam room  MSK:   Left knee - Moderate periarticular swelling without bruising or erythema. Tender to palpation diffusely around the joint. Range of motion is significantly limited in flexion. Strength reduced at 2/5. Patient unable to tolerate special tests due to pain.  Neuro: sensation and motor function intact    Assessment & Plan:  73 year old female with worsening chronic left knee pain secondary to osteoarthritis, with significant functional  limitation and poor response to current pain management. Symptoms include pain with weight-bearing, difficulty sleeping, limited flexion, and reduced strength. Conservative measures including Tylenol #3, gabapentin, OTC analgesics, and hot/cold compresses have been ineffective. Last intra-articular injection 2 months ago provided minimal relief. Patient is not ready for a surgical intervention given recent Whipple procedure.   Plan:  - Follow up for next intra-articular knee injection in a month - Recommend use of a knee sleeve for support - Continue OTC pain medications as needed for symptom relief - Discussed nerve block as a potential option if pain remains refractory   PROCEDURE:  Risks & benefits of LEFT knee aspiration & toradol injection reviewed. Consent obtained. Time-out completed. Patient prepped and draped in the normal fashion. Area cleansed with chlorhexidene. Ethyl chloride spray used to anesthetize the skin. Solution of 3 mL 1% lidocaine injected into superior lateral aspect of left knee for local anesthesia.  After ensuring adequate anesthesia an 18-g 1.5-inch needle on 60-mL syringe inserted into left knee via superior-lateral approach.  Under ultrasound guidance, aspirated 20 cc of serosanguinis synovial fluid - was not sent for analysis.  Needle left in place, syringe  exchanged for syringe with solution of 60mg  of toradol.  This was injected into the left knee without complication.  Photos taken and saved into chart.  Patient tolerated procedure well without any complications. Area covered with adhesive bandage. Post-procedure care reviewed.  All questions answered.    Attestation I saw and personally examined the patient.  I agree with MS4 students assessment and plan.  Patient dealing with left chronic knee pain likely related to worsening OA.  Patient has steroid injection approximately 2 months ago, in the meantime patient would like some temporary relief but does have appointment to follow-up with pain management.  Will go ahead and do aspiration of the knee joint today given swelling of the knee.  Able to take out approximately 20 cc.  Toradol also injected into the area.  Patient was advised to follow-up in the sports medicine clinic with pain doctor to see if she may benefit from a geniculate nerve block.  Also advised patient to wear knee sleeve for support in the meantime as well as compression.

## 2023-05-23 NOTE — Patient Instructions (Signed)
 Today you received an injection with an NSAID (aka: Toradol). This injection is usually done in response to pain and inflammation. There is some "numbing medicine" (Lidocaine) in the shot, so the injected area may be numb and feel really good for the next couple of hours. The numbing medicine usually wears off in 2-3 hours, and then the Toradol should start working without a few hours.    The actually benefit from the steroid injection is usually noticed within a few hours.  Things to watch out for that you should contact us or a health care provider urgently would include: 1. Unusual (as in more than 10%) increase in pain 2. New fever > 101.5 3. New swelling or redness of the injected area. 4. Streaking of red lines around the area injected.  Do not hesitate to call or reach out with any questions or concerns.

## 2023-05-23 NOTE — Telephone Encounter (Signed)
 Copied from CRM [id number]. Topic: Clinical - Medication Refill >> May 23, 2023 12:13 PM Everette Rank wrote: Most Recent Primary Care Visit:  Provider: [facility]  Department: [facility]  Visit Type: LAB 30  Date: 03/24/2023  Medication: cholecalciferol (VITAMIN D3) 25 MCG (1000 UNIT) tablet   Has the patient contacted their pharmacy? Yes (Agent: If no, request that the patient contact the pharmacy for the refill. If patient does not wish to contact the pharmacy document the reason why and proceed with request.) (Agent: If yes, when and what did the pharmacy advise?)  Is this the correct pharmacy for this prescription? Yes If no, delete pharmacy and type the correct one.  This is the patient's preferred pharmacy:  [facility] [address], [place] - [address] [place] [address] Phone: [phone] Fax: [phone]   Has the prescription been filled recently? No  Is the patient out of the medication? No  Has the patient been seen for an appointment in the last year OR does the patient have an upcoming appointment? Yes  Can we respond through MyChart? Yes  Agent: Please be advised that Rx refills may take up to 3 business days. We ask that you follow-up with your pharmacy.

## 2023-05-23 NOTE — Telephone Encounter (Signed)
 Pt has remaining vitamin d3 refills.

## 2023-06-03 ENCOUNTER — Other Ambulatory Visit: Payer: Self-pay | Admitting: Student

## 2023-06-03 ENCOUNTER — Ambulatory Visit: Payer: Self-pay

## 2023-06-03 NOTE — Telephone Encounter (Signed)
 Patient stated she went to [facility] today to pick-up her Vitamin D  prescription. Patient was advised that no prescription had been sent over. Patient was advised by pharmacy staff that she could buy Vitamin D  OTC. Patient is requesting prescription to be sent over. Please advise.   Communication Reason for CRM: Patient called to speak with clinic about Vitamin D  prescription. Patient was steered toward OTC at the pharmacy. Please confirm desired strength of Vitamin D  and follow up with patient. Current prescription is OTC.  Reason for Disposition  [1] Prescription refill request for NON-ESSENTIAL medicine (i.e., no harm to patient if med not taken) AND [2] triager unable to refill per department policy  Protocols used: Medication Refill and Renewal Call-A-AH

## 2023-06-03 NOTE — Telephone Encounter (Signed)
 This RN attempted return call to patient. No answer. LVM.

## 2023-06-04 NOTE — Telephone Encounter (Signed)
 Received a call from [facility], stated pt is requesting a rx for Vitamin D  50,000 units once a week. Thanks

## 2023-06-06 ENCOUNTER — Ambulatory Visit: Admitting: Pain Medicine

## 2023-06-06 ENCOUNTER — Encounter: Payer: Self-pay | Admitting: Pain Medicine

## 2023-06-06 VITALS — BP 136/82 | Ht 66.0 in | Wt 170.0 lb

## 2023-06-06 DIAGNOSIS — M1712 Unilateral primary osteoarthritis, left knee: Secondary | ICD-10-CM

## 2023-06-06 DIAGNOSIS — M549 Dorsalgia, unspecified: Secondary | ICD-10-CM | POA: Diagnosis not present

## 2023-06-06 MED ORDER — KETOROLAC TROMETHAMINE 60 MG/2ML IM SOLN
30.0000 mg | Freq: Once | INTRAMUSCULAR | Status: AC
  Administered 2023-06-06: 30 mg via INTRAMUSCULAR

## 2023-06-06 MED ORDER — CAPSAICIN 0.025 % EX CREA
TOPICAL_CREAM | Freq: Four times a day (QID) | CUTANEOUS | 2 refills | Status: AC
Start: 2023-06-06 — End: ?

## 2023-06-06 NOTE — Progress Notes (Addendum)
 PCP: Karalee Oscar, DO  Subjective:   HPI: Patient is a 73 y.o. female with no significant PMH here for follow up of chronic left knee pain ongoing for past 2 years. Pain is described as constant and jabbing, rated 8/10. Pain is diffuse around the knee, worsened by walking and weight-bearing activities. No radiation, numbness, or tingling. Has tried NSAIDs and Voltaren  gel with temporary relief. Received two prior steroid injections with limited benefit. No history of physical therapy. Of note, recent X-rays (2/13) show advanced tricompartmental degenerative changes.  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   Steroid injections: 09/2013 12/2013    Lumbar radiculopathy, chronic    Nontraumatic incomplete tear of right rotator cuff 07/27/2019   Primary osteoarthritis of right hip 04/27/2018   Right knee meniscal tear 03/14/2011   Right knee pain    posterior horn medial meniscal tear MRI 2013   Septic arthritis (HCC) 02/25/2020   Spinal stenosis    getting epidural injections -last one 06/12/2015   Status post lumbar laminectomy 03/16/2019   Status post total hip replacement, 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 90 tablet 2   cholecalciferol  (VITAMIN D3) 25 MCG (1000 UNIT) tablet Take 1 tablet (1,000 Units total) by mouth daily. 30 tablet 3   COD LIVER OIL PO Take by mouth.     famotidine  (PEPCID ) 20 MG tablet TAKE 1 TABLET BY MOUTH TWICE A DAY (Patient not taking: Reported on 01/23/2023) 60 tablet 2   ferrous gluconate  (FERGON) 324 MG tablet Take 1 tablet (324 mg total) by mouth daily with breakfast. 60 tablet 1   FLUoxetine  (PROZAC ) 20 MG capsule Take 1 capsule (20 mg total) by mouth daily. 30 capsule 2   fluticasone  (FLONASE ) 50 MCG/ACT nasal spray USE 1 OR 2 SPRAYS IN EACH NOSTRIL DAILY 16 g 3   fluticasone  (FLOVENT  HFA) 44 MCG/ACT inhaler Inhale 1 puff into the lungs 2 (two) times daily. 1 each 2   gabapentin  (NEURONTIN ) 600 MG tablet Take 1 tablet (600 mg total) by mouth 3 (three) times daily. 90 tablet 11   HYDROcodone -acetaminophen  (NORCO) 7.5-325 MG tablet Take 1 tablet by mouth every 6 (six) hours as needed for moderate pain (pain score 4-6). 15 tablet 0   ibuprofen  (ADVIL ) 800 MG tablet Take 1 tablet (800 mg total) by mouth every 8 (eight) hours as needed. (Patient not taking: Reported on 01/23/2023) 12 tablet 0   lisinopril -hydrochlorothiazide  (ZESTORETIC ) 20-12.5 MG tablet Take 1 tablet by mouth daily. 90 tablet 2   loratadine  (CLARITIN ) 10 MG tablet Take 1  tablet (10 mg total) by mouth daily. 30 tablet 0   metoCLOPramide  (REGLAN ) 5 MG tablet Take 1 tablet (5 mg total) by mouth 3 (three) times daily as needed for nausea. (Patient not taking: Reported on 01/23/2023) 90 tablet 1   Multiple Vitamin (MULTIVITAMIN WITH MINERALS) TABS tablet Take 1 tablet by mouth daily.     omeprazole  (PRILOSEC) 40 MG capsule Take 1 capsule (40 mg total) by mouth in the morning and at bedtime. (Patient not taking: Reported on 01/23/2023) 60 capsule 3   promethazine  (PHENERGAN ) 25 MG tablet Take 1 tablet (25 mg total) by mouth every 8 (eight) hours as needed. (Patient not taking: Reported on  01/23/2023) 30 tablet 0   QUEtiapine  (SEROQUEL ) 100 MG tablet Take 1 tablet (100 mg total) by mouth at bedtime. 90 tablet 3   rosuvastatin  (CRESTOR ) 20 MG tablet Take 1 tablet (20 mg total) by mouth daily. 90 tablet 3   sodium chloride  (OCEAN) 0.65 % SOLN nasal spray Place 1 spray into both nostrils 2 (two) times daily as needed. 30 mL 0   sucralfate  (CARAFATE ) 1 GM/10ML suspension Take 10 mLs (1 g total) by mouth every 6 (six) hours as needed. (Patient not taking: Reported on 01/23/2023) 420 mL 1   No current facility-administered medications on file prior to visit.    Past Surgical History:  Procedure Laterality Date   APPENDECTOMY     Bladder tack  10/13/2006   cystocoele   BREAST SURGERY Right    benign cyst   CARPAL TUNNEL RELEASE Left 10/25/2019   Procedure: LEFT CARPAL TUNNEL RELEASE;  Surgeon: Alphonso Jean, MD;  Location: [facility];  Service: Orthopedics;  Laterality: Left;   COLONOSCOPY     IRRIGATION AND DEBRIDEMENT SHOULDER Right 02/21/2020   Procedure: IRRIGATION AND DEBRIDEMENT RIGHT SHOULDER;  Surgeon: Wes Hamman, MD;  Location: [facility];  Service: Orthopedics;  Laterality: Right;   LUMBAR LAMINECTOMY N/A 03/16/2019   Procedure: CENTRAL LAMINECTOMIES L2-3, L3-4 AND L4-5;  Surgeon: Alphonso Jean, MD;  Location: [facility];  Service: Orthopedics;  Laterality: N/A;   ROTATOR CUFF REPAIR     TOTAL HIP ARTHROPLASTY Right 04/27/2018   Procedure: RIGHT TOTAL HIP ARTHROPLASTY ANTERIOR APPROACH;  Surgeon: Wes Hamman, MD;  Location: [facility];  Service: Orthopedics;  Laterality: Right;   TUBAL LIGATION     VAGINAL HYSTERECTOMY  2008    Allergies  Allergen Reactions   Tramadol  Other (See Comments)    Hallucinate, nightmare     BP 136/82   Ht 5\' 6"  (1.676 m)   Wt 170 lb (77.1 kg)   BMI 27.44 kg/m       No data to display              No data to display              Objective:  Physical Exam:  Gen: NAD, comfortable in exam  room  MSK:  Left knee:  Mild swelling, no erythema or ecchymosis  Tender to palpation diffusely around knee and on joint line   Limited ROM on flexion   Strength limited due to pain   Resisted straight leg test elicited pain in knee   Unable to do Veldon German test due to pain    Neuro: sensation and motor function intact   Assessment & Plan:  Patient is a 73 year old with no significant PMH here for chronic left knee pain consistent with advanced tricompartmental osteoarthritis, as  evidenced by diffuse tenderness, limited ROM, and pain-limited strength. Symptoms are exacerbated by activity and weight-bearing, with imaging confirming degenerative changes. Neuro exam intact. Prior conservative measures provided only temporary relief.  Plan:  - Discussed low-impact exercise such as water aerobics to stay physically active.   - Start topical capsaicin 0.025% cream in small amounts to knee 3-4 times per day.   - Refer to physical therapy for strengthening and mobility of knee   - Discussed genicular nerve block as future pain management option if conservative measures fail  Sujata Gearldine Kehr, MS4

## 2023-06-11 ENCOUNTER — Ambulatory Visit: Payer: 59 | Admitting: Student

## 2023-06-11 VITALS — BP 130/82 | HR 73 | Temp 98.0°F | Ht 66.0 in | Wt 184.4 lb

## 2023-06-11 DIAGNOSIS — Z90410 Acquired total absence of pancreas: Secondary | ICD-10-CM

## 2023-06-11 DIAGNOSIS — I10 Essential (primary) hypertension: Secondary | ICD-10-CM

## 2023-06-11 DIAGNOSIS — F32A Depression, unspecified: Secondary | ICD-10-CM

## 2023-06-11 DIAGNOSIS — J4541 Moderate persistent asthma with (acute) exacerbation: Secondary | ICD-10-CM

## 2023-06-11 DIAGNOSIS — Z0000 Encounter for general adult medical examination without abnormal findings: Secondary | ICD-10-CM

## 2023-06-11 DIAGNOSIS — E782 Mixed hyperlipidemia: Secondary | ICD-10-CM

## 2023-06-11 DIAGNOSIS — R87611 Atypical squamous cells cannot exclude high grade squamous intraepithelial lesion on cytologic smear of cervix (ASC-H): Secondary | ICD-10-CM | POA: Diagnosis not present

## 2023-06-11 DIAGNOSIS — M48062 Spinal stenosis, lumbar region with neurogenic claudication: Secondary | ICD-10-CM

## 2023-06-11 DIAGNOSIS — R7303 Prediabetes: Secondary | ICD-10-CM

## 2023-06-11 DIAGNOSIS — E559 Vitamin D deficiency, unspecified: Secondary | ICD-10-CM | POA: Diagnosis not present

## 2023-06-11 DIAGNOSIS — Z9049 Acquired absence of other specified parts of digestive tract: Secondary | ICD-10-CM

## 2023-06-11 DIAGNOSIS — Z1231 Encounter for screening mammogram for malignant neoplasm of breast: Secondary | ICD-10-CM

## 2023-06-11 DIAGNOSIS — Z131 Encounter for screening for diabetes mellitus: Secondary | ICD-10-CM

## 2023-06-11 MED ORDER — GABAPENTIN 600 MG PO TABS
600.0000 mg | ORAL_TABLET | Freq: Three times a day (TID) | ORAL | 11 refills | Status: AC
Start: 2023-06-11 — End: ?

## 2023-06-11 MED ORDER — LISINOPRIL-HYDROCHLOROTHIAZIDE 20-12.5 MG PO TABS: 1.0000 | ORAL_TABLET | Freq: Every day | ORAL | 3 refills | Status: AC

## 2023-06-11 MED ORDER — FLUTICASONE PROPIONATE HFA 44 MCG/ACT IN AERO: 1.0000 | INHALATION_SPRAY | Freq: Two times a day (BID) | RESPIRATORY_TRACT | 2 refills | Status: AC

## 2023-06-11 MED ORDER — ROSUVASTATIN CALCIUM 20 MG PO TABS
20.0000 mg | ORAL_TABLET | Freq: Every day | ORAL | 3 refills | Status: AC
Start: 2023-06-11 — End: 2024-06-05

## 2023-06-11 MED ORDER — AMLODIPINE BESYLATE 5 MG PO TABS: 5.0000 mg | ORAL_TABLET | Freq: Every day | ORAL | 3 refills | Status: AC

## 2023-06-11 NOTE — Patient Instructions (Addendum)
 Thank you, [name] for allowing us  to provide your care today. Today we discussed   Your blood pressure, your preventative health, your knee osteoarthritis, and life after your surgery.  I also refilled your medications  Referrals: - mammogram - psychiatry to see a psychiatrist at [facility]   I have ordered the following labs for you:   Lab Orders         BMP8+Anion Gap         Hemoglobin A1c      I will call if any are abnormal. All of your labs can be accessed through "My Chart".   My Chart Access: [email or web address]  Please follow-up in: 3 months or sooner if your lab work shows any alterations.    We look forward to seeing you next time. Please call our clinic at [phone] if you have any questions or concerns. The best time to call is Monday-Friday from 9am-4pm, but there is someone available 24/7. If after hours or the weekend, call the main hospital number and ask for the Internal Medicine Resident On-Call. If you need medication refills, please notify your pharmacy one week in advance and they will send us  a request.   Thank you for letting us  take part in your care. Wishing you the best!  Olivia Clunes, MD 06/11/2023, 1:55 PM [facility]

## 2023-06-11 NOTE — Progress Notes (Signed)
 Subjective:  CC: blood pressure follow up  HPI:  Olivia Ewing is a 73 y.o. female with a past medical history stated below and presents today for blood pressure. Please see problem based assessment and plan for additional details.  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   Steroid injections: 09/2013 12/2013    Lumbar radiculopathy, chronic    Nontraumatic incomplete tear of right rotator cuff 07/27/2019   Primary osteoarthritis of right hip 04/27/2018   Right knee meniscal tear 03/14/2011   Right knee pain    posterior horn medial meniscal tear MRI 2013   Septic arthritis (HCC) 02/25/2020   Spinal stenosis    getting epidural injections -last one 06/12/2015   Status post lumbar laminectomy 03/16/2019   Status post total hip replacement, 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 Apply small amount to knee 3-4 times per day. 60 g 2   cholecalciferol  (VITAMIN D3) 25 MCG (1000 UNIT) tablet Take 1 tablet (1,000 Units total) by mouth daily. 30 tablet 3   COD LIVER OIL PO Take by mouth.     ferrous gluconate  (FERGON) 324 MG tablet Take 1 tablet (324 mg  total) by mouth daily with breakfast. 60 tablet 1   FLUoxetine  (PROZAC ) 20 MG capsule Take 1 capsule (20 mg total) by mouth daily. 30 capsule 2   fluticasone  (FLONASE ) 50 MCG/ACT nasal spray USE 1 OR 2 SPRAYS IN EACH NOSTRIL DAILY 16 g 3   HYDROcodone -acetaminophen  (NORCO) 7.5-325 MG tablet Take 1 tablet by mouth every 6 (six) hours as needed for moderate pain (pain score 4-6). 15 tablet 0   loratadine  (CLARITIN ) 10 MG tablet Take 1 tablet (10 mg total) by mouth daily. 30 tablet 0   Multiple Vitamin (MULTIVITAMIN WITH MINERALS) TABS tablet Take 1 tablet by mouth daily.     QUEtiapine  (SEROQUEL ) 100 MG tablet Take 1 tablet (100 mg total) by mouth at bedtime. 90 tablet 3   sodium chloride  (OCEAN) 0.65 % SOLN nasal spray Place 1 spray into both nostrils 2 (two) times daily as needed. 30 mL 0   No current facility-administered medications on file prior to visit.    Family History  Problem Relation Age of Onset   Hypertension Mother    Colon cancer Neg Hx    Esophageal cancer Neg Hx    Rectal cancer Neg Hx    Stomach cancer Neg Hx     Social History   Socioeconomic History   Marital status: Divorced    Spouse name: Not on  file   Number of children: 4   Years of education: Not on file   Highest education level: Not on file  Occupational History   Not on file  Tobacco Use   Smoking status: Former    Current packs/day: 0.00    Types: Cigarettes    Quit date: 11/26/1983    Years since quitting: 39.5    Passive exposure: Never   Smokeless tobacco: Never  Vaping Use   Vaping status: Never Used  Substance and Sexual Activity   Alcohol use: Yes    Alcohol/week: 0.0 standard drinks of alcohol    Comment: rarely   Drug use: Not Currently    Types: Marijuana    Comment: last used 2019   Sexual activity: Yes    Birth control/protection: Surgical  Other Topics Concern   Not on file  Social History Narrative   Single. 9 siblings. 1 brother with HIV, 1 sister with hypertension. 4  children, 1 daughter with HTN and depression, 1 son with HTN.   [phone]- shelter's number.    Former smoker, no alcohol or drug use.   Social Drivers of Health   Financial Resource Strain: Medium Risk (12/19/2022)   Overall Financial Resource Strain (CARDIA)    Difficulty of Paying Living Expenses: Somewhat hard  Food Insecurity: No Food Insecurity (02/25/2023)   Hunger Vital Sign    Worried About Running Out of Food in the Last Year: Never true    Ran Out of Food in the Last Year: Never true  Recent Concern: Food Insecurity - Food Insecurity Present (12/19/2022)   Hunger Vital Sign    Worried About Running Out of Food in the Last Year: Sometimes true    Ran Out of Food in the Last Year: Never true  Transportation Needs: No Transportation Needs (02/25/2023)   PRAPARE - Administrator, Civil Service (Medical): No    Lack of Transportation (Non-Medical): No  Physical Activity: Sufficiently Active (12/19/2022)   Exercise Vital Sign    Days of Exercise per Week: 7 days    Minutes of Exercise per Session: 30 min  Stress: Stress Concern Present (12/19/2022)   [employer] of Occupational Health - Occupational Stress Questionnaire    Feeling of Stress : Rather much  Social Connections: Unknown (12/19/2022)   Social Connection and Isolation Panel [NHANES]    Frequency of Communication with Friends and Family: More than three times a week    Frequency of Social Gatherings with Friends and Family: Patient declined    Attends Religious Services: Patient declined    Database administrator or Organizations: No    Attends Banker Meetings: Never    Marital Status: Divorced  Catering manager Violence: Not At Risk (02/25/2023)   Humiliation, Afraid, Rape, and Kick questionnaire    Fear of Current or Ex-Partner: No    Emotionally Abused: No    Physically Abused: No    Sexually Abused: No    Review of Systems: ROS negative except for what is noted on the assessment  and plan.  Objective:   Vitals:   06/11/23 1310  BP: 130/82  Pulse: 73  Temp: 98 F (36.7 C)  TempSrc: Oral  SpO2: 100%  Weight: 184 lb 6.4 oz (83.6 kg)  Height: 5\' 6"  (1.676 m)    Physical Exam: Constitutional: well-appearing woman sitting in chair, in no acute distress HENT:  mucous membranes moist Eyes: conjunctiva non-erythematous Cardiovascular: regular rate and rhythm, no m/r/g Pulmonary/Chest: normal work  of breathing on room air, lungs clear to auscultation bilaterally Abdominal: soft, non-tender, non-distended MSK: normal bulk and tone Neurological: alert & oriented x 3 Skin: warm and dry Psych: Pleasant mood and affect    Assessment & Plan:   Prediabetes Last A1c:  Lab Results  Component Value Date   HGBA1C 5.9 (H) 06/11/2023  New this screening. Discussed lifestyle modifications with patient to slow or reverse progression to diabetes.  Essential hypertension Controlled this visit 130/82 with amlodipine  and lisinopril  hydrochlorothiazide .  - BMP today within reference values  Atypical squamous cells cannot exclude high grade squamous intraepithelial lesion on cytologic smear of cervix (ASC-H) Patient saw Dr. Beecher Bower in December 2024  Healthcare maintenance Scheduled for mammogram  Depression Longstanding history. She has been taking Sertraline , Seroquel . She is requesting a new referral to psychiatry given her prior history of suicidally. No SI/HI today.   Vitamin D  insufficiency Taking OTC vitamin D  and multivitamin  H/O Whipple procedure Doing well. Eating without nausea, vomiting, early satiety. Normal bowel movements.  Return in about 3 months (around 09/10/2023) for Blood pressure and other chronic medical conditions.  Patient discussed with Dr. Warden Ha Garald Jumbo, MD [facility]  06/13/2023, 6:05 PM

## 2023-06-12 ENCOUNTER — Telehealth: Payer: Self-pay

## 2023-06-12 LAB — HEMOGLOBIN A1C
Est. average glucose Bld gHb Est-mCnc: 123 mg/dL
Hgb A1c MFr Bld: 5.9 % — ABNORMAL HIGH (ref 4.8–5.6)

## 2023-06-12 LAB — BMP8+ANION GAP
Anion Gap: 12 mmol/L (ref 10.0–18.0)
BUN/Creatinine Ratio: 19 (ref 12–28)
BUN: 14 mg/dL (ref 8–27)
CO2: 24 mmol/L (ref 20–29)
Calcium: 9.7 mg/dL (ref 8.7–10.3)
Chloride: 102 mmol/L (ref 96–106)
Creatinine, Ser: 0.73 mg/dL (ref 0.57–1.00)
Glucose: 84 mg/dL (ref 70–99)
Potassium: 4.4 mmol/L (ref 3.5–5.2)
Sodium: 138 mmol/L (ref 134–144)
eGFR: 88 mL/min/{1.73_m2} (ref >59)

## 2023-06-12 NOTE — Telephone Encounter (Signed)
 Prior Authorization for patient (Fluticasone  Propionate HFA 44MCG/ACT aerosol) came through on cover my meds was submitted with last office notes awaiting approval or denial.  [id number]

## 2023-06-12 NOTE — Telephone Encounter (Signed)
 Olivia Ewing (KeyVerneice Goldman) PA Case ID #: [id number] Rx #: [id number] Need Help? Call us  at [phone] Outcome Approved today by [facility] Medicare 2017 NCPDP Request Reference Number: [id number]. FLUTICAS HFA AER is approved through 02/11/2024. Your patient may now fill this prescription and it will be covered. Effective Date: 06/12/2023 Authorization Expiration Date: 02/11/2024 Drug Fluticasone  Propionate HFA 44MCG/ACT aerosol ePA cloud logo Form [employer] Medicare Part D Electronic Prior Authorization Form (2017 NCPDP) Original Claim Info [id number] Provide Exception Process Printed NoticeNon-Form, Dr. Dusty Gin ePA at [email or web address];[email or web address] REDIHALER PREF`D*WMART*[id number]*Please visit [email or web address] for assistance resolvingthe most common patient rejections

## 2023-06-13 DIAGNOSIS — R7303 Prediabetes: Secondary | ICD-10-CM | POA: Insufficient documentation

## 2023-06-13 NOTE — Assessment & Plan Note (Signed)
 Patient saw Dr. Beecher Bower in December 2024

## 2023-06-13 NOTE — Assessment & Plan Note (Signed)
Scheduled for mammogram.  ?

## 2023-06-13 NOTE — Assessment & Plan Note (Addendum)
 Last A1c:  Lab Results  Component Value Date   HGBA1C 5.9 (H) 06/11/2023  New this screening. Discussed lifestyle modifications with patient to slow or reverse progression to diabetes.

## 2023-06-13 NOTE — Assessment & Plan Note (Signed)
 Longstanding history. She has been taking Sertraline , Seroquel . She is requesting a new referral to psychiatry given her prior history of suicidally. No SI/HI today.

## 2023-06-13 NOTE — Assessment & Plan Note (Signed)
 Taking OTC vitamin D  and multivitamin

## 2023-06-13 NOTE — Assessment & Plan Note (Addendum)
 Controlled this visit 130/82 with amlodipine  and lisinopril  hydrochlorothiazide .  - BMP today within reference values

## 2023-06-13 NOTE — Assessment & Plan Note (Signed)
 Doing well. Eating without nausea, vomiting, early satiety. Normal bowel movements.

## 2023-06-18 ENCOUNTER — Ambulatory Visit

## 2023-06-18 NOTE — Progress Notes (Signed)
 Internal Medicine Clinic Attending  Case discussed with the resident at the time of the visit.  We reviewed the resident's history and exam and pertinent patient test results.  I agree with the assessment, diagnosis, and plan of care documented in the resident's note.

## 2023-06-24 ENCOUNTER — Ambulatory Visit
Admission: RE | Admit: 2023-06-24 | Discharge: 2023-06-24 | Disposition: A | Discharge status: Home / Self Care | Source: Ambulatory Visit | Attending: Internal Medicine | Admitting: Internal Medicine

## 2023-06-24 DIAGNOSIS — Z1231 Encounter for screening mammogram for malignant neoplasm of breast: Secondary | ICD-10-CM

## 2023-06-26 ENCOUNTER — Telehealth: Payer: Self-pay | Admitting: *Deleted

## 2023-06-26 NOTE — Telephone Encounter (Signed)
 Done

## 2023-06-26 NOTE — Telephone Encounter (Signed)
 Copied from CRM [id number]. Topic: Clinical - Lab/Test Results >> Jun 26, 2023  3:06 PM Carrielelia G wrote: Reason for CRM:  Please call regarding mammogram results. (Pcp has not read)

## 2023-06-27 ENCOUNTER — Ambulatory Visit: Payer: Self-pay

## 2023-06-27 ENCOUNTER — Telehealth: Payer: Self-pay | Admitting: Physical Medicine and Rehabilitation

## 2023-06-27 NOTE — Telephone Encounter (Signed)
  Chief Complaint: vaginal discharge Symptoms: itching, discharge   Disposition: [] ED /[] Urgent Care (no appt availability in office) / [x] Appointment(In office/virtual)/ []  [place] [facility]/ [] Home Care/ [] Refused Recommended Disposition /[] [place] [facility]/ []  Follow-up with PCP Additional Notes: Pt calling with vaginal symptoms of discharge and itching. Pt has had these symptoms for 4 days. Pt had intercourse without a condom 10-14 days ago and is worried.  Pt has soonest appt for 5/19 @ 1015. RN gave care advice and advised pt to seek care at [facility] if symptoms worsen over weekend. Pt verbalized understanding.             Copied from CRM [id number]. Topic: Clinical - Red Word Triage >> Jun 27, 2023 11:05 AM Retta Caster wrote: Red Word that prompted transfer to Nurse Triage: Patient Private are is having a iItch/Discharge for 1 week but worse 1-2 days Reason for Disposition  [1] Symptoms of a yeast infection (i.e., itchy, white discharge, not bad smelling) AND [2] not improved > 3 days following Care Advice  Answer Assessment - Initial Assessment Questions 1. SYMPTOM: "What's the main symptom you're concerned about?" (e.g., pain, itching, dryness)     Discharge and itching  2. LOCATION: "Where is the   located?" (e.g., inside/outside, left/right)     Inside  3. ONSET: "When did the    start?"    4 days ago  4. PAIN: "Is there any pain?" If Yes, ask: "How bad is it?" (Scale: 1-10; mild, moderate, severe)   -  MILD (1-3): Doesn't interfere with normal activities.    -  MODERATE (4-7): Interferes with normal activities (e.g., work or school) or awakens from sleep.     -  SEVERE (8-10): Excruciating pain, unable to do any normal activities.     Denies  5. ITCHING: "Is there any itching?" If Yes, ask: "How bad is it?" (Scale: 1-10; mild, moderate, severe)     8 6. CAUSE: "What do you think is causing the discharge?" "Have you had the same problem before? What happened then?"      Intercourse without condom 7. OTHER SYMPTOMS: "Do you have any other symptoms?" (e.g., fever, itching, vaginal bleeding, pain with urination, injury to genital area, vaginal foreign body)     Denies  Protocols used: Vaginal Symptoms-A-AH

## 2023-06-27 NOTE — Telephone Encounter (Signed)
 Copied from CRM [id number]. Topic: Clinical - Red Word Triage >> Jun 27, 2023 11:05 AM Retta Caster wrote: Red Word that prompted transfer to Nurse Triage: Patient Private are is having a iItch/Discharge for 1 week but worse 1-2 days Answer Assessment - Initial Assessment Questions 1. DISCHARGE: "Describe the discharge." (e.g., white, yellow, green, gray, foamy, cottage cheese-like)     Itchy and discharge Reports did not use a condom with partner - wants to get checked for STD 2. ODOR: "Is there a bad odor?"     *No Answer* 3. ONSET: "When did the discharge begin?"     X1 week 4. RASH: "Is there a rash in the genital area?" If Yes, ask: "Describe it." (e.g., redness, blisters, sores, bumps)     *No Answer* 5. ABDOMEN PAIN: "Are you having any abdomen pain?" If Yes, ask: "What does it feel like? " (e.g., crampy, dull, intermittent, constant)      *No Answer* 6. ABDOMEN PAIN SEVERITY: If present, ask: "How bad is it?" (e.g., Scale 1-10; mild, moderate, or severe)   - MILD (1-3): Doesn't interfere with normal activities, abdomen soft and not tender to touch.    - MODERATE (4-7): Interferes with normal activities or awakens from sleep, abdomen tender to touch.    - SEVERE (8-10): Excruciating pain, doubled over, unable to do any normal activities. (R/O peritonitis)      *No Answer* 7. CAUSE: "What do you think is causing the discharge?" "Have you had the same problem before? What happened then?"     *No Answer* 8. OTHER SYMPTOMS: "Do you have any other symptoms?" (e.g., fever, itching, vaginal bleeding, pain with urination, injury to genital area, vaginal foreign body)     *No Answer* 9. PREGNANCY: "Is there any chance you are pregnant?" "When was your last menstrual period?"     *No Answer*  Protocols used: Vaginal Discharge-A-AH

## 2023-06-27 NOTE — Telephone Encounter (Signed)
 Patient called and said she wants to make an appointment for the epidural shot. [phone]

## 2023-06-30 ENCOUNTER — Encounter: Payer: Self-pay | Admitting: Student

## 2023-06-30 ENCOUNTER — Other Ambulatory Visit: Payer: Self-pay

## 2023-06-30 ENCOUNTER — Other Ambulatory Visit (HOSPITAL_COMMUNITY)
Admission: RE | Admit: 2023-06-30 | Discharge: 2023-06-30 | Disposition: A | Discharge status: Home / Self Care | Source: Ambulatory Visit | Attending: Family Medicine | Admitting: Family Medicine

## 2023-06-30 ENCOUNTER — Ambulatory Visit: Payer: Self-pay | Admitting: Student

## 2023-06-30 ENCOUNTER — Telehealth: Payer: Self-pay | Admitting: Physical Medicine and Rehabilitation

## 2023-06-30 VITALS — BP 145/87 | HR 58 | Temp 97.9°F | Ht 66.0 in | Wt 186.8 lb

## 2023-06-30 DIAGNOSIS — Z113 Encounter for screening for infections with a predominantly sexual mode of transmission: Secondary | ICD-10-CM

## 2023-06-30 DIAGNOSIS — D489 Neoplasm of uncertain behavior, unspecified: Secondary | ICD-10-CM | POA: Diagnosis not present

## 2023-06-30 DIAGNOSIS — N898 Other specified noninflammatory disorders of vagina: Secondary | ICD-10-CM | POA: Diagnosis not present

## 2023-06-30 DIAGNOSIS — M5416 Radiculopathy, lumbar region: Secondary | ICD-10-CM

## 2023-06-30 NOTE — Patient Instructions (Signed)
 Thank you, [name] for allowing us  to provide your care today.   I have ordered the following labs for you:  Lab Orders         HIV antibody (with reflex)      Follow up: in 1 month if symptoms fail to resolve or sooner if needed    Should you have any questions or concerns please call the internal medicine clinic at [phone].     Jose Ngo, MD [facility]

## 2023-06-30 NOTE — Assessment & Plan Note (Signed)
 Patient states that she has been having a papule that believes that it was an arthropod bite that has been present for about a month and a half.  It started being itchy and she scratched it but then became very painful.  There is no oozing, no fluctuance warmth or erythema around this lesion.  It is hyperpigmented but it seems that she has scratched the skin all the way to the subcutaneous fat.  I have advised her to stop scratching as this can predispose her to infection.  She states that this is very painful.  She has not had any fevers chills or systemic symptoms.  Given that it is tender I did advise her to keep watching to see if it continues to grow or bleeds or and not heal.  Should this lesion remain unhealed for the next 4 weeks it may require biopsy.  She can apply topical diphenhydramine  or take Zyrtec for the itch if needed

## 2023-06-30 NOTE — Assessment & Plan Note (Addendum)
 About a week and a half ago she had sex without a condom with her partner.  She is sexually active with men.  She is concerned that she has an STI.  It had been a while since she was sexually active and she usually uses condoms.  She is concerned that now she has increased itch and discharge.  She denies having any blisters, ulcers, and has no burning.  However this is itchy and she has a discharge that is not green but it does have a smell to it.  She has also recently changed her soaps and switched her wipes.  Given that she is concerned for an STI we will test for HIV, gonorrhea, chlamydia, trichomonas, and BV.  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  She is not having any signs of abdominal pain or jaundice today.  She tested negative for hepatitis C a year ago.  She does not have any rashes in her hands at the palms of her soles.  She has not seen an ulcer in her genitals would not like testing for syphilis. She denies having any burning with urination or other urinary sxs.    -HIV, G/C,trichomonas, BV testing -avoid soaps, use only water or go back to prior soaps and wipes to see if she is having contact dermatitis and allergies to the new products she is using

## 2023-06-30 NOTE — Telephone Encounter (Signed)
 Patient called and wants an appointment for epidural shot. [phone]

## 2023-06-30 NOTE — Progress Notes (Addendum)
 CC:  Chief Complaint  Patient presents with   Vaginal Discharge    Patient Private are is having a iItch/Discharge for 1 week but worse 1-2 days   HPI:  [name] is a 73 y.o. female living with a history stated below and presents today for the above. Please see problem based assessment and plan for additional details.  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   Steroid injections: 09/2013 12/2013    Lumbar radiculopathy, chronic    Nontraumatic incomplete tear of right rotator cuff 07/27/2019   Primary osteoarthritis of right hip 04/27/2018   Right knee meniscal tear 03/14/2011   Right knee pain    posterior horn medial meniscal tear MRI 2013   Septic arthritis (HCC) 02/25/2020   Spinal stenosis    getting epidural injections -last one 06/12/2015   Status post lumbar laminectomy 03/16/2019   Status post total hip replacement, 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    Current Outpatient Medications on File Prior to Visit  Medication Sig Dispense Refill   albuterol  (VENTOLIN  HFA) 108 (90 Base) MCG/ACT inhaler INHALE 1 TO 2 PUFFS INTO THE LUNGS EVERY 6 HOURS AS NEEDED FOR SHORTNESS OF BREATH 8.5 g 3   amLODipine  (NORVASC ) 5 MG tablet Take 1 tablet (5 mg total) by mouth daily. 90 tablet 3   capsaicin  (ZOSTRIX) 0.025 % cream Apply topically in the morning, at noon, in the evening, and at bedtime. Apply small amount to knee 3-4 times per day. 60 g 2   cholecalciferol  (VITAMIN D3) 25 MCG  (1000 UNIT) tablet Take 1 tablet (1,000 Units total) by mouth daily. 30 tablet 3   COD LIVER OIL PO Take by mouth.     ferrous gluconate  (FERGON) 324 MG tablet Take 1 tablet (324 mg total) by mouth daily with breakfast. 60 tablet 1   FLUoxetine  (PROZAC ) 20 MG capsule Take 1 capsule (20 mg total) by mouth daily. 30 capsule 2   fluticasone  (FLONASE ) 50 MCG/ACT nasal spray USE 1 OR 2 SPRAYS IN EACH NOSTRIL DAILY 16 g 3   fluticasone  (FLOVENT  HFA) 44 MCG/ACT inhaler Inhale 1 puff into the lungs 2 (two) times daily. 1 each 2   gabapentin  (NEURONTIN ) 600 MG tablet Take 1 tablet (600 mg total) by mouth 3 (three) times daily. 90 tablet 11   HYDROcodone -acetaminophen  (NORCO) 7.5-325 MG tablet Take 1 tablet by mouth every 6 (six) hours as needed for moderate pain (pain score 4-6). 15 tablet 0   lisinopril -hydrochlorothiazide  (ZESTORETIC ) 20-12.5 MG tablet Take 1 tablet by mouth daily. 90 tablet 3   loratadine  (CLARITIN ) 10 MG tablet Take 1 tablet (10 mg total) by mouth daily. 30 tablet 0   Multiple Vitamin (MULTIVITAMIN WITH MINERALS) TABS tablet Take 1 tablet by mouth daily.     QUEtiapine  (SEROQUEL ) 100 MG tablet Take 1 tablet (100 mg total) by mouth at bedtime. 90 tablet 3  rosuvastatin  (CRESTOR ) 20 MG tablet Take 1 tablet (20 mg total) by mouth daily. 90 tablet 3   sodium chloride  (OCEAN) 0.65 % SOLN nasal spray Place 1 spray into both nostrils 2 (two) times daily as needed. 30 mL 0   No current facility-administered medications on file prior to visit.    Family History  Problem Relation Age of Onset   Hypertension Mother    Colon cancer Neg Hx    Esophageal cancer Neg Hx    Rectal cancer Neg Hx    Stomach cancer Neg Hx    Breast cancer Neg Hx    BRCA 1/2 Neg Hx     Social History   Socioeconomic History   Marital status: Divorced    Spouse name: Not on file   Number of children: 4   Years of education: Not on file   Highest education level: Not on file  Occupational History   Not on  file  Tobacco Use   Smoking status: Former    Current packs/day: 0.00    Types: Cigarettes    Quit date: 11/26/1983    Years since quitting: 39.6    Passive exposure: Never   Smokeless tobacco: Never  Vaping Use   Vaping status: Never Used  Substance and Sexual Activity   Alcohol use: Yes    Alcohol/week: 0.0 standard drinks of alcohol    Comment: rarely   Drug use: Not Currently    Types: Marijuana    Comment: last used 2019   Sexual activity: Yes    Birth control/protection: Surgical  Other Topics Concern   Not on file  Social History Narrative   Single. 9 siblings. 1 brother with HIV, 1 sister with hypertension. 4 children, 1 daughter with HTN and depression, 1 son with HTN.   [phone]- shelter's number.    Former smoker, no alcohol or drug use.   Social Drivers of Health   Financial Resource Strain: Medium Risk (12/19/2022)   Overall Financial Resource Strain (CARDIA)    Difficulty of Paying Living Expenses: Somewhat hard  Food Insecurity: No Food Insecurity (02/25/2023)   Hunger Vital Sign    Worried About Running Out of Food in the Last Year: Never true    Ran Out of Food in the Last Year: Never true  Recent Concern: Food Insecurity - Food Insecurity Present (12/19/2022)   Hunger Vital Sign    Worried About Running Out of Food in the Last Year: Sometimes true    Ran Out of Food in the Last Year: Never true  Transportation Needs: No Transportation Needs (02/25/2023)   PRAPARE - Administrator, Civil Service (Medical): No    Lack of Transportation (Non-Medical): No  Physical Activity: Sufficiently Active (12/19/2022)   Exercise Vital Sign    Days of Exercise per Week: 7 days    Minutes of Exercise per Session: 30 min  Stress: Stress Concern Present (12/19/2022)   [employer] of Occupational Health - Occupational Stress Questionnaire    Feeling of Stress : Rather much  Social Connections: Unknown (12/19/2022)   Social Connection and Isolation Panel  [NHANES]    Frequency of Communication with Friends and Family: More than three times a week    Frequency of Social Gatherings with Friends and Family: Patient declined    Attends Religious Services: Patient declined    Database administrator or Organizations: No    Attends Banker Meetings: Never    Marital Status: Divorced  Catering manager  Violence: Not At Risk (02/25/2023)   Humiliation, Afraid, Rape, and Kick questionnaire    Fear of Current or Ex-Partner: No    Emotionally Abused: No    Physically Abused: No    Sexually Abused: No    Review of Systems: ROS negative except for what is noted on the assessment and plan.  Vitals:   06/30/23 1038 06/30/23 1047  BP: (!) 152/85 (!) 145/87  Pulse: 60 (!) 58  Temp: 97.9 F (36.6 C)   TempSrc: Oral   SpO2: 100%   Weight: 186 lb 12.8 oz (84.7 kg)   Height: 5\' 6"  (1.676 m)     Physical Exam: Constitutional: well-appearing, in no acute distress HENT: normocephalic atraumatic, mucous membranes moist Eyes: conjunctiva non-erythematous Cardiovascular: regular rate and rhythm, no m/r/g Pulmonary/Chest: normal work of breathing on room air, lungs clear to auscultation bilaterally Abdominal: soft, non-tender, non-distended MSK: normal bulk and tone Neurological: alert & oriented x 3, no focal deficit Skin: warm and dry, hyperpigmented eroded papule with lichenification on the right lateral thigh (picture below) that is tender to palpation GU: offered, pt deferred Psych: normal mood and behavior    Assessment & Plan:   Patient discussed with Dr. Lanetta Pion  Neoplasm of uncertain behavior Patient states that she has been having a papule that believes that it was an arthropod bite that has been present for about a month and a half.  It started being itchy and she scratched it but then became very painful.  There is no oozing, no fluctuance warmth or erythema around this lesion.  It is hyperpigmented but it seems that she  has scratched the skin all the way to the subcutaneous fat.  I have advised her to stop scratching as this can predispose her to infection.  She states that this is very painful.  She has not had any fevers chills or systemic symptoms.  Given that it is tender I did advise her to keep watching to see if it continues to grow or bleeds or and not heal.  Should this lesion remain unhealed for the next 4 weeks it may require biopsy.  She can apply topical diphenhydramine  or take Zyrtec for the itch if needed  Vaginal discharge About a week and a half ago she had sex without a condom with her partner.  She is sexually active with men.  She is concerned that she has an STI.  It had been a while since she was sexually active and she usually uses condoms.  She is concerned that now she has increased itch and discharge.  She denies having any blisters, ulcers, and has no burning.  However this is itchy and she has a discharge that is not green but it does have a smell to it.  She has also recently changed her soaps and switched her wipes.  Given that she is concerned for an STI we will test for HIV, gonorrhea, chlamydia, trichomonas, and BV.  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  She is not having any signs of abdominal pain or jaundice today.  She tested negative for hepatitis C a year ago.  She does not have any rashes in her hands at the palms of her soles.  She has not seen an ulcer in her genitals would not like testing for syphilis. She denies having any burning with urination or other urinary sxs.    -HIV, G/C,trichomonas, BV testing -avoid soaps, use only water or go back to prior  soaps and wipes to see if she is having contact dermatitis and allergies to the new products she is using  Anshul Meddings Alexander-Savino, MD [facility] Internal Medicine, PGY-1 Phone: [phone] Date 07/01/2023 Time 2:15 PM

## 2023-07-01 ENCOUNTER — Ambulatory Visit (HOSPITAL_BASED_OUTPATIENT_CLINIC_OR_DEPARTMENT_OTHER): Attending: Pain Medicine | Admitting: Physical Therapy

## 2023-07-01 ENCOUNTER — Ambulatory Visit: Payer: Self-pay | Admitting: Student

## 2023-07-01 DIAGNOSIS — B379 Candidiasis, unspecified: Secondary | ICD-10-CM

## 2023-07-01 DIAGNOSIS — A599 Trichomoniasis, unspecified: Secondary | ICD-10-CM

## 2023-07-01 LAB — CERVICOVAGINAL ANCILLARY ONLY
Bacterial Vaginitis (gardnerella): NEGATIVE
Candida Glabrata: NEGATIVE
Candida Vaginitis: POSITIVE — AB
Chlamydia: NEGATIVE
Comment: NEGATIVE
Comment: NEGATIVE
Comment: NEGATIVE
Comment: NEGATIVE
Comment: NEGATIVE
Comment: NORMAL
Neisseria Gonorrhea: NEGATIVE
Trichomonas: POSITIVE — AB

## 2023-07-01 LAB — HIV ANTIBODY (ROUTINE TESTING W REFLEX): HIV Screen 4th Generation wRfx: NONREACTIVE

## 2023-07-01 MED ORDER — METRONIDAZOLE 500 MG PO TABS: 500.0000 mg | ORAL_TABLET | Freq: Two times a day (BID) | ORAL | 0 refills | Status: AC

## 2023-07-01 MED ORDER — CLOTRIMAZOLE 3 2 % VA CREA: TOPICAL_CREAM | VAGINAL | 0 refills | Status: AC

## 2023-07-01 NOTE — Progress Notes (Signed)
 Swab positive for trichomonas and candida. She is on quetiapine , prescription for 2% clotrimazole cream for three nights and metronidazole  500mg  BID for 7 days sent to pharmacy. Instructions and education for co-treatment for partner provided to pt. Pt will abstain from sex with same partner until both get treatment with metronidazole  and complete it.

## 2023-07-02 NOTE — Progress Notes (Signed)
 Internal Medicine Clinic Attending  Case discussed with the resident at the time of the visit.  We reviewed the resident's history and exam and pertinent patient test results.  I agree with the assessment, diagnosis, and plan of care documented in the resident's note.

## 2023-07-03 NOTE — Telephone Encounter (Signed)
 Called pt Pain is in the same location as last time Pain level is a 10 Last injection helped until the end of last month And no falls

## 2023-07-03 NOTE — Addendum Note (Signed)
 Addended by: Milas Alias on: 07/03/2023 10:44 AM   Modules accepted: Orders

## 2023-07-10 ENCOUNTER — Encounter: Admitting: Physical Medicine & Rehabilitation

## 2023-07-12 ENCOUNTER — Encounter (HOSPITAL_COMMUNITY): Payer: Self-pay

## 2023-07-12 ENCOUNTER — Ambulatory Visit (HOSPITAL_BASED_OUTPATIENT_CLINIC_OR_DEPARTMENT_OTHER): Admitting: Psychiatry

## 2023-07-12 DIAGNOSIS — F32A Depression, unspecified: Secondary | ICD-10-CM

## 2023-07-12 NOTE — Progress Notes (Signed)
 No response to call or video invite. Unable to leave a voicemail since voicemail box full.

## 2023-07-22 ENCOUNTER — Ambulatory Visit (HOSPITAL_BASED_OUTPATIENT_CLINIC_OR_DEPARTMENT_OTHER): Admitting: Physical Therapy

## 2023-07-23 ENCOUNTER — Other Ambulatory Visit: Payer: Self-pay

## 2023-07-23 ENCOUNTER — Encounter (HOSPITAL_BASED_OUTPATIENT_CLINIC_OR_DEPARTMENT_OTHER): Payer: Self-pay | Admitting: Physical Therapy

## 2023-07-23 ENCOUNTER — Ambulatory Visit (HOSPITAL_BASED_OUTPATIENT_CLINIC_OR_DEPARTMENT_OTHER): Attending: Pain Medicine | Admitting: Physical Therapy

## 2023-07-23 DIAGNOSIS — G8929 Other chronic pain: Secondary | ICD-10-CM | POA: Diagnosis not present

## 2023-07-23 DIAGNOSIS — M1712 Unilateral primary osteoarthritis, left knee: Secondary | ICD-10-CM | POA: Insufficient documentation

## 2023-07-23 DIAGNOSIS — R2689 Other abnormalities of gait and mobility: Secondary | ICD-10-CM | POA: Insufficient documentation

## 2023-07-23 DIAGNOSIS — M5459 Other low back pain: Secondary | ICD-10-CM | POA: Insufficient documentation

## 2023-07-23 DIAGNOSIS — M6281 Muscle weakness (generalized): Secondary | ICD-10-CM | POA: Insufficient documentation

## 2023-07-23 DIAGNOSIS — M25562 Pain in left knee: Secondary | ICD-10-CM | POA: Insufficient documentation

## 2023-07-23 NOTE — Therapy (Signed)
 OUTPATIENT PHYSICAL THERAPY LOWER EXTREMITY EVALUATION   Patient Name: Olivia Ewing MRN: [id number] DOB:1951/01/31, 73 y.o., female Today's Date: 07/23/2023  END OF SESSION:  PT End of Session - 07/23/23 1112     Visit Number 1    Number of Visits 16    Date for PT Re-Evaluation 09/20/23    Authorization Type [facility] mcr    PT Start Time 0802    PT Stop Time 0845    PT Time Calculation (min) 43 min    Activity Tolerance Patient limited by pain    Behavior During Therapy [facility] for tasks assessed/performed             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   Steroid injections: 09/2013 12/2013    Lumbar radiculopathy, chronic    Nontraumatic incomplete tear of right rotator cuff 07/27/2019   Primary osteoarthritis of right hip 04/27/2018   Right knee meniscal tear 03/14/2011   Right knee pain    posterior horn medial meniscal tear MRI 2013   Septic arthritis (HCC) 02/25/2020   Spinal stenosis    getting epidural injections -last one 06/12/2015   Status post lumbar laminectomy 03/16/2019   Status post total hip replacement, 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   Past Surgical History:  Procedure Laterality Date   APPENDECTOMY     Bladder tack  10/13/2006   cystocoele   BREAST BIOPSY Right 1974   BREAST SURGERY Right    benign cyst   CARPAL TUNNEL RELEASE Left 10/25/2019   Procedure: LEFT CARPAL TUNNEL RELEASE;  Surgeon: Alphonso Jean, MD;  Location: [facility];   Service: Orthopedics;  Laterality: Left;   COLONOSCOPY     IRRIGATION AND DEBRIDEMENT SHOULDER Right 02/21/2020   Procedure: IRRIGATION AND DEBRIDEMENT RIGHT SHOULDER;  Surgeon: Wes Hamman, MD;  Location: [facility];  Service: Orthopedics;  Laterality: Right;   LUMBAR LAMINECTOMY N/A 03/16/2019   Procedure: CENTRAL LAMINECTOMIES L2-3, L3-4 AND L4-5;  Surgeon: Alphonso Jean, MD;  Location: [facility];  Service: Orthopedics;  Laterality: N/A;   ROTATOR CUFF REPAIR     TOTAL HIP ARTHROPLASTY Right 04/27/2018   Procedure: RIGHT TOTAL HIP ARTHROPLASTY ANTERIOR APPROACH;  Surgeon: Wes Hamman, MD;  Location: [facility];  Service: Orthopedics;  Laterality: Right;   TUBAL LIGATION     VAGINAL HYSTERECTOMY  2008   Patient Active Problem List   Diagnosis Date Noted   Neoplasm of uncertain behavior 06/30/2023   Vaginal discharge 06/30/2023   Prediabetes 06/13/2023   Osteopenia 03/17/2023   H/O Whipple procedure 12/19/2022   Polyp of duodenum 04/03/2022   Primary osteoarthritis of left knee 11/29/2021   Trigger finger, right ring finger 04/10/2021   Trigger finger, left ring finger 02/26/2021   Asthma 12/13/2020   GERD (gastroesophageal reflux disease) 12/13/2020   Housing instability 12/13/2020   Atypical squamous cells cannot exclude high  grade squamous intraepithelial lesion on cytologic smear of cervix (ASC-H) 09/01/2020   Eddie Good Quervain's disease (tenosynovitis) 08/22/2020   Insomnia 06/08/2020   No-show for appointment 02/25/2020   S/P arthroscopy of right shoulder 01/05/2020   Carpal tunnel syndrome, left upper limb 10/25/2019    Class: Chronic   Carpal tunnel syndrome, right upper limb 10/25/2019    Class: Chronic   Tendinopathy of right biceps tendon 07/27/2019   Breast cancer screening by mammogram 06/07/2019   Seasonal allergic rhinitis due to pollen 06/07/2019   Colon cancer screening 06/07/2019   Lumbar stenosis with neurogenic claudication 03/16/2019    Class:  Chronic   Acute stress reaction 09/25/2018   Vitamin D  insufficiency 11/25/2013   Healthcare maintenance 11/25/2013   Depression 09/23/2011   Functional incontinence 10/18/2010   Pain in right leg 10/18/2010   Iron deficiency anemia 05/12/2008   Hyperlipemia 06/18/2007   Essential hypertension 05/20/2007    PCP: Alston Jerry Masters DO  REFERRING PROVIDER: Dixon Fredrickson, MD   REFERRING DIAG: Primary osteoarthritis of left knee   THERAPY DIAG:  Chronic pain of left knee  Muscle weakness (generalized)  Other low back pain  Other abnormalities of gait and mobility  Rationale for Evaluation and Treatment: Rehabilitation  ONSET DATE: 4-5 years ago  SUBJECTIVE:   SUBJECTIVE STATEMENT: Whipple procedure last August.  Needed to learn how to walk again.  R thr x 4 years ago, have spinal stenosis x 5 years with surgery. I wear a back brace everyday to supports my back. I have tingling, numbness and P&N in right hip through outside of foot. Get epidurals every 3 months which helps. I am so tired  of being in pain  PERTINENT HISTORY: Whipple Right THR Bilat Oa knees Lumbar stenosis PAIN:  Are you having pain? Yes: NPRS scale: 10/10 Pain location: left knee Pain description: ache sharp Aggravating factors: sitting too long, standing 20 mins Relieving factors: heat, elevation  Back and right hip 8/10 current ; min 6/10   PRECAUTIONS: None  RED FLAGS: None   WEIGHT BEARING RESTRICTIONS: No  FALLS:  Has patient fallen in last 6 months? No  LIVING ENVIRONMENT: Lives with: lives with their daughter Lives in: House/apartment Stairs: Yes: Internal: 16 steps; on right going up Has following equipment at home: Single point cane  OCCUPATION: retired  PLOF: Independent  PATIENT GOALS: manage pain. Strengthening general  NEXT MD VISIT: as needed  OBJECTIVE:  Note: Objective measures were completed at Evaluation unless otherwise noted.  DIAGNOSTIC FINDINGS: advanced  tricompartmental osteoarthritis Left  PATIENT SURVEYS:  LEFS:31/80  COGNITION: Overall cognitive status: Within functional limits for tasks assessed     SENSATION: Numbness tingling; and P&N Right le  POSTURE: decreased lumbar lordosis, flexed trunk , and weight shift right  PALPATION: Moderate TTP left med knee joint line  LOWER EXTREMITY ROM:  HD (Lbs) Right eval Left eval  Hip flexion 28.0 13.1  Hip extension    Hip abduction 26.0 16.5  Hip adduction    Hip internal rotation    Hip external rotation    Knee flexion    Knee extension 26.6 14.8  Ankle dorsiflexion    Ankle plantarflexion    Ankle inversion    Ankle eversion     (Blank rows = not tested)  LOWER EXTREMITY MMT:  AROM Right eval Left eval  Hip flexion    Hip extension    Hip abduction    Hip adduction    Hip internal rotation  Hip external rotation    Knee flexion  72  Knee extension  -20  Ankle dorsiflexion    Ankle plantarflexion    Ankle inversion    Ankle eversion     (Blank rows = not tested)  FUNCTIONAL TESTS:  Tug and Berg balance test to be completed as tolerated  GAIT: Distance walked: 400 ft Assistive device utilized: Single point cane Level of assistance: Complete Independence Comments: Antalgic lle off loading right, increased rue arm swing for momentum to advance lle hiking hip avoiding left knee flex during swing.                                                                                                                                TREATMENT  Eval Self care:Posture and Optometrist instruction   PATIENT EDUCATION:  Education details: Discussed eval findings, rehab rationale, aquatic program progression/POC and pools in area. Patient is in agreement  Person educated: Patient Education method: Explanation Education comprehension: verbalized understanding  HOME EXERCISE PROGRAM: TBA Pt does not have pool access at this time  ASSESSMENT:  CLINICAL  IMPRESSION: Patient is a 73 y.o. f who was seen today for physical therapy evaluation and treatment for oa left knee.  Pt has vast pMhx including whipple procedure (1 year ago), lumbar surgery, carpel tunnel surgery,and right THR that has left her with LE and core weakness.  She presents using cane stating that she keeps moving despite all of her pain because she will not give in.  She has moderate constant pain in LB through right hip with radicular pattern into right foot and left knee pain. She required several position changes throughout exam (sitting, standing and walking) to tolerate.  Not all functional testing completed but will plan on completion as able.  She is a good candidate for aquatic physical therapy intervention and will benefit from the properties of water to initially manage pain and then allow for toleration to strengthening, stretching of core and LE and balance retraining to improve overall safety with adl's and QOL.  OBJECTIVE IMPAIRMENTS: Abnormal gait, decreased activity tolerance, decreased balance, decreased endurance, decreased knowledge of use of DME, decreased mobility, difficulty walking, decreased ROM, decreased strength, impaired flexibility, postural dysfunction, and pain.   ACTIVITY LIMITATIONS: carrying, lifting, bending, sitting, standing, squatting, sleeping, stairs, transfers, and locomotion level  PARTICIPATION LIMITATIONS: meal prep, cleaning, laundry, shopping, community activity, occupation, and yard work  PERSONAL FACTORS: Past/current experiences, Time since onset of injury/illness/exacerbation, and 3+ comorbidities: see pmHx are also affecting patient's functional outcome.   REHAB POTENTIAL: Good  CLINICAL DECISION MAKING: Unstable/unpredictable  EVALUATION COMPLEXITY: High   GOALS: Goals reviewed with patient? Yes  SHORT TERM GOALS: Target date: 08/20/23 Pt will tolerate full aquatic sessions consistently without increase in pain and with improving  function to demonstrate good toleration and effectiveness of intervention.  Baseline: Goal status: INITIAL  2.  Pt will improve left knee flex to 90d and  extension to -10 Baseline:  Goal status: INITIAL  3.  Pt will tolerate walking to and from setting and engaging in aquatic therapy session without excessive fatigue or increase in pain to demonstrate improved toleration to activity Baseline:  Goal status: INITIAL  4.  TUG and Berg balance  functional testing to be complete/tolerated. Baseline: unable to tolerate Goal status: INITIAL  5.  Pt will tolerate stair climbing using alternating or step to pattern ascending and descending 7 steps with use of handrail Baseline:  Goal status: INITIAL   LONG TERM GOALS: Target date: 09/20/23  Pt to improve on LEFS by at least 9 point to demonstrate statistically significant Improvement in function. Baseline:  Goal status: INITIAL  2.   Baseline:  Goal status: INITIAL  3.  Pt will report decrease in pain by at least 50% for improved toleration to activity/quality of life and to demonstrate improved management of pain. Baseline: see chart Goal status: INITIAL  4.  Pt will improve strength in LLE by at least 10 lbs to demonstrate improved overall physical function Baseline: see chart Goal status: INITIAL  5.  Pt will improve on Berg balance test to >/= 45/56 to demonstrate a low fall risk. Baseline: TBT Goal status: INITIAL     PLAN:  PT FREQUENCY: 2x/week  PT DURATION: 8 weeks  PLANNED INTERVENTIONS: 97164- PT Re-evaluation, 97750- Physical Performance Testing, 97110-Therapeutic exercises, 97530- Therapeutic activity, [id number]- Neuromuscular re-education, 97535- Self Care, 16109- Manual therapy, [id number]- Gait training, [phone]- Orthotic Initial, [phone]- Aquatic Therapy, [phone]- Electrical stimulation (unattended), [phone]- Electrical stimulation (manual), [phone] (1-2 muscles), [id number] (3+ muscles)- Dry Needling, Patient/Family education,  Balance training, Stair training, Taping, Joint mobilization, DME instructions, Cryotherapy, and Moist heat  PLAN FOR NEXT SESSION: Aquatic for stretching and strengthening of core and le; balance retraining, stair climbing, gait and posture.   Adriana Hopping [place]) Naimah Yingst MPT 07/23/23 11:19 AM [facility] [address] [place], [place], [address] Phone: [phone]   Fax:  [phone]  Date of referral: 06/06/23 Referring provider: Dixon Fredrickson, MD  Referring diagnosis? Primary osteoarthritis of left knee  Treatment diagnosis? (if different than referring diagnosis) no but added LBP  What was this (referring dx) caused by? Arthritis  Nature of Condition: Chronic (continuous duration > 3 months)   Laterality: Lt  Current Functional Measure Score: LEFS 31/80  Objective measurements identify impairments when they are compared to normal values, the uninvolved extremity, and prior level of function.  [x]  Yes  []  No  Objective assessment of functional ability: Moderate functional limitations   Briefly describe symptoms: Chronic left knee pain due to OA with limited ROM and strength  How did symptoms start: chronic  Average pain intensity:  Last 24 hours: 10/10  Past week: 10/10  How often does the pt experience symptoms? Constantly  How much have the symptoms interfered with usual daily activities? Quite a bit  How has condition changed since care began at this facility? NA - initial visit  In general, how is the patients overall health? Fair   BACK PAIN (STarT Back Screening Tool) Has pain spread down the leg(s) at some time in the last 2 weeks? yes Has there been pain in the shoulder or neck at some time in the last 2 weeks? no Has the pt only walked short distances because of back pain? yes Has patient dressed more slowly because of back pain in the past 2 weeks? yes Does patient think it's not safe for a person  with  this condition to be physically active? no Does patient have worrying thoughts a lot of the time? no Does patient feel back pain is terrible and will never get any better? yes Has patient stopped enjoying things they usually enjoy? yes

## 2023-07-31 ENCOUNTER — Encounter (HOSPITAL_BASED_OUTPATIENT_CLINIC_OR_DEPARTMENT_OTHER): Payer: Self-pay | Admitting: Physical Therapy

## 2023-07-31 ENCOUNTER — Ambulatory Visit (HOSPITAL_BASED_OUTPATIENT_CLINIC_OR_DEPARTMENT_OTHER): Admitting: Physical Therapy

## 2023-07-31 DIAGNOSIS — R2689 Other abnormalities of gait and mobility: Secondary | ICD-10-CM

## 2023-07-31 DIAGNOSIS — M5459 Other low back pain: Secondary | ICD-10-CM

## 2023-07-31 DIAGNOSIS — M6281 Muscle weakness (generalized): Secondary | ICD-10-CM

## 2023-07-31 DIAGNOSIS — G8929 Other chronic pain: Secondary | ICD-10-CM | POA: Diagnosis not present

## 2023-07-31 DIAGNOSIS — M25562 Pain in left knee: Secondary | ICD-10-CM | POA: Diagnosis not present

## 2023-07-31 DIAGNOSIS — M1712 Unilateral primary osteoarthritis, left knee: Secondary | ICD-10-CM | POA: Diagnosis not present
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
## 2023-08-01 ENCOUNTER — Encounter: Payer: Self-pay | Admitting: *Deleted

## 2023-08-04 ENCOUNTER — Ambulatory Visit (INDEPENDENT_AMBULATORY_CARE_PROVIDER_SITE_OTHER): Admitting: Physical Medicine and Rehabilitation

## 2023-08-04 ENCOUNTER — Other Ambulatory Visit: Payer: Self-pay

## 2023-08-04 VITALS — BP 136/86 | HR 72

## 2023-08-04 DIAGNOSIS — M5416 Radiculopathy, lumbar region: Secondary | ICD-10-CM | POA: Diagnosis not present

## 2023-08-04 MED ORDER — METHYLPREDNISOLONE ACETATE 40 MG/ML IJ SUSP
40.0000 mg | Freq: Once | INTRAMUSCULAR | Status: AC
  Administered 2023-08-04: 40 mg

## 2023-08-04 NOTE — Progress Notes (Signed)
 Olivia Ewing - 73 y.o. female MRN [id number]  Date of birth: 22-Mar-1950  Office Visit Note: Visit Date: 08/04/2023 PCP: Waymond Cart, MD Referred by: Masters, Izetta, DO  Subjective: Chief Complaint  Patient presents with   Lower Back - Pain   HPI:  Olivia Ewing is a 72 y.o. female who comes in today for planned repeat Right S1-2  Lumbar Transforaminal epidural steroid injection with fluoroscopic guidance.  The patient has failed conservative care including home exercise, medications, time and activity modification.  This injection will be diagnostic and hopefully therapeutic.  Please see requesting physician notes for further details and justification. Patient received more than 50% pain relief from prior injection.   Referring: Duwaine Pouch, FNP   ROS Otherwise per HPI.  Assessment & Plan: Visit Diagnoses:    ICD-10-CM   1. Lumbar radiculopathy  M54.16 XR C-ARM NO REPORT    Epidural Steroid injection    methylPREDNISolone  acetate (DEPO-MEDROL ) injection 40 mg      Plan: No additional findings.   Meds & Orders:  Meds ordered this encounter  Medications   methylPREDNISolone  acetate (DEPO-MEDROL ) injection 40 mg    Orders Placed This Encounter  Procedures   XR C-ARM NO REPORT   Epidural Steroid injection    Follow-up: Return if symptoms worsen or fail to improve.   Procedures: No procedures performed  S1 Lumbosacral Transforaminal Epidural Steroid Injection - Sub-Pedicular Approach with Fluoroscopic Guidance   Patient: Olivia Ewing      Date of Birth: 04/02/1950 MRN: [id number] PCP: Waymond Cart, MD      Visit Date: 08/04/2023   Universal Protocol:    Date/Time: 06/23/251:11 PM  Consent Given By: the patient  Position:  PRONE  Additional Comments: Vital signs were monitored before and after the procedure. Patient was prepped and draped in the usual sterile fashion. The correct patient, procedure, and site was verified.   Injection Procedure  Details:  Procedure Site One Meds Administered:  Meds ordered this encounter  Medications   methylPREDNISolone  acetate (DEPO-MEDROL ) injection 40 mg    Laterality: Right  Location/Site:  S1 Foramen   Needle size: 22 ga.  Needle type: Spinal  Needle Placement: Transforaminal  Findings:   -Comments: Excellent flow of contrast along the nerve, nerve root and into the epidural space.  Epidurogram: Contrast epidurogram showed no nerve root cut off or restricted flow pattern.  Procedure Details: After squaring off the sacral end-plate to get a true AP view, the C-arm was positioned so that the best possible view of the S1 foramen was visualized. The soft tissues overlying this structure were infiltrated with 2-3 ml. of 1% Lidocaine  without Epinephrine .    The spinal needle was inserted toward the target using a trajectory view along the fluoroscope beam.  Under AP and lateral visualization, the needle was advanced so it did not puncture dura. Biplanar projections were used to confirm position. Aspiration was confirmed to be negative for CSF and/or blood. A 1-2 ml. volume of Isovue -250 was injected and flow of contrast was noted at each level. Radiographs were obtained for documentation purposes.   After attaining the desired flow of contrast documented above, a 0.5 to 1.0 ml test dose of 0.25% Marcaine  was injected into each respective transforaminal space.  The patient was observed for 90 seconds post injection.  After no sensory deficits were reported, and normal lower extremity motor function was noted,   the above injectate was administered so that equal amounts of the injectate  were placed at each foramen (level) into the transforaminal epidural space.   Additional Comments:  The patient tolerated the procedure well Dressing: Band-Aid with 2 x 2 sterile gauze    Post-procedure details: Patient was observed during the procedure. Post-procedure instructions were  reviewed.  Patient left the clinic in stable condition.   Clinical History: CLINICAL DATA:  Lumbar radiculopathy, symptoms persist with > 6 wks treatment   EXAM: MRI LUMBAR SPINE WITHOUT CONTRAST   TECHNIQUE: Multiplanar, multisequence MR imaging of the lumbar spine was performed. No intravenous contrast was administered.   COMPARISON:  MRI lumbar spine December 30, 2018.   FINDINGS: Segmentation: Standard segmentation is assumed. The inferior-most fully formed intervertebral disc labeled L5-S1.   Alignment:  No substantial sagittal subluxation.   Vertebrae: Degenerative/discogenic endplate signal changes at multiple levels. No specific evidence of acute fracture, discitis/osteomyelitis, or suspicious bone lesion.   Conus medullaris and cauda equina: Conus extends to the L1-L2 level. Conus appears normal.   Paraspinal and other soft tissues: Partially imaged left renal cyst.   Disc levels:   T12-L1: No significant disc protrusion, foraminal stenosis, or canal stenosis.   L1-L2: Broad disc bulging with superimposed left subarticular disc protrusion. Bilateral facet hypertrophy and ligamentum flavum thickening. Resulting moderate to severe left subarticular recess stenosis with potential for impingement. Mild central canal stenosis and mild foraminal stenosis.   L2-L3: Disc bulging and endplate spurring. Left greater than right facet arthropathy. Ligamentum flavum thickening. Resulting mild-to-moderate left and mild right foraminal stenosis. Patent central canal.   L3-L4: Broad disc bulge. Evidence of prior postoperative changes posteriorly. Bilateral facet arthropathy. Moderate right and severe left foraminal stenosis, which is progressed. Patent central canal.   L4-L5: Postoperative changes posteriorly. Broad disc bulging. Bilateral facet arthropathy. Severe left and moderate right foraminal stenosis, progressed. Patent central canal.   L5-S1: Disc bulging and  endplate spurring with superimposed small central disc protrusion. Resulting moderate right subarticular recess stenosis. Mild central canal stenosis. Mild left foraminal stenosis.   IMPRESSION: 1. 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 Canal stenosis is improved at L4-L5, now patent. 2. At L1-L2, progressive moderate to severe left subarticular recess stenosis. 3. At L5-S1, progressive moderate right subarticular recess narrowing. Mild central canal and left foraminal stenosis. 4. At L2-L3, mild to moderate left and mild right foraminal stenosis.     Electronically Signed   By: Gilmore GORMAN Molt M.D.   On: 07/05/2022 08:52     Objective:  VS:  HT:    WT:   BMI:     BP:136/86  HR:72bpm  TEMP: ( )  RESP:  Physical Exam Vitals and nursing note reviewed.  Constitutional:      General: She is not in acute distress.    Appearance: Normal appearance. She is not ill-appearing.  HENT:     Head: Normocephalic and atraumatic.     Right Ear: External ear normal.     Left Ear: External ear normal.   Eyes:     Extraocular Movements: Extraocular movements intact.    Cardiovascular:     Rate and Rhythm: Normal rate.     Pulses: Normal pulses.  Pulmonary:     Effort: Pulmonary effort is normal. No respiratory distress.  Abdominal:     General: There is no distension.     Palpations: Abdomen is soft.   Musculoskeletal:        General: Tenderness present.     Cervical back: Neck supple.  Right lower leg: No edema.     Left lower leg: No edema.     Comments: Patient has good distal strength with no pain over the greater trochanters.  No clonus or focal weakness.   Skin:    Findings: No erythema, lesion or rash.   Neurological:     General: No focal deficit present.     Mental Status: She is alert and oriented to person, place, and time.     Sensory: No sensory deficit.     Motor: No weakness or abnormal muscle tone.      Coordination: Coordination normal.   Psychiatric:        Mood and Affect: Mood normal.        Behavior: Behavior normal.      Imaging: No results found.

## 2023-08-04 NOTE — Procedures (Signed)
 S1 Lumbosacral Transforaminal Epidural Steroid Injection - Sub-Pedicular Approach with Fluoroscopic Guidance   Patient: Olivia Ewing      Date of Birth: Apr 07, 1950 MRN: [id number] PCP: Waymond Cart, MD      Visit Date: 08/04/2023   Universal Protocol:    Date/Time: 06/23/251:11 PM  Consent Given By: the patient  Position:  PRONE  Additional Comments: Vital signs were monitored before and after the procedure. Patient was prepped and draped in the usual sterile fashion. The correct patient, procedure, and site was verified.   Injection Procedure Details:  Procedure Site One Meds Administered:  Meds ordered this encounter  Medications   methylPREDNISolone  acetate (DEPO-MEDROL ) injection 40 mg    Laterality: Right  Location/Site:  S1 Foramen   Needle size: 22 ga.  Needle type: Spinal  Needle Placement: Transforaminal  Findings:   -Comments: Excellent flow of contrast along the nerve, nerve root and into the epidural space.  Epidurogram: Contrast epidurogram showed no nerve root cut off or restricted flow pattern.  Procedure Details: After squaring off the sacral end-plate to get a true AP view, the C-arm was positioned so that the best possible view of the S1 foramen was visualized. The soft tissues overlying this structure were infiltrated with 2-3 ml. of 1% Lidocaine  without Epinephrine .    The spinal needle was inserted toward the target using a trajectory view along the fluoroscope beam.  Under AP and lateral visualization, the needle was advanced so it did not puncture dura. Biplanar projections were used to confirm position. Aspiration was confirmed to be negative for CSF and/or blood. A 1-2 ml. volume of Isovue -250 was injected and flow of contrast was noted at each level. Radiographs were obtained for documentation purposes.   After attaining the desired flow of contrast documented above, a 0.5 to 1.0 ml test dose of 0.25% Marcaine  was injected into each  respective transforaminal space.  The patient was observed for 90 seconds post injection.  After no sensory deficits were reported, and normal lower extremity motor function was noted,   the above injectate was administered so that equal amounts of the injectate were placed at each foramen (level) into the transforaminal epidural space.   Additional Comments:  The patient tolerated the procedure well Dressing: Band-Aid with 2 x 2 sterile gauze    Post-procedure details: Patient was observed during the procedure. Post-procedure instructions were reviewed.  Patient left the clinic in stable condition.

## 2023-08-04 NOTE — Progress Notes (Signed)
 Pain Scale   Average Pain 10 Patient advising her lower back pain radiates to right leg down to right foot.        +Driver, -BT, -Dye Allergies.

## 2023-08-04 NOTE — Patient Instructions (Signed)

## 2023-08-05 ENCOUNTER — Ambulatory Visit (HOSPITAL_BASED_OUTPATIENT_CLINIC_OR_DEPARTMENT_OTHER): Admitting: Physical Therapy

## 2023-08-05 ENCOUNTER — Encounter (HOSPITAL_BASED_OUTPATIENT_CLINIC_OR_DEPARTMENT_OTHER): Payer: Self-pay | Admitting: Physical Therapy

## 2023-08-05 DIAGNOSIS — G8929 Other chronic pain: Secondary | ICD-10-CM

## 2023-08-05 DIAGNOSIS — R2689 Other abnormalities of gait and mobility: Secondary | ICD-10-CM

## 2023-08-05 DIAGNOSIS — M6281 Muscle weakness (generalized): Secondary | ICD-10-CM

## 2023-08-05 DIAGNOSIS — M25562 Pain in left knee: Secondary | ICD-10-CM | POA: Diagnosis not present

## 2023-08-05 DIAGNOSIS — M5459 Other low back pain: Secondary | ICD-10-CM

## 2023-08-05 DIAGNOSIS — M1712 Unilateral primary osteoarthritis, left knee: Secondary | ICD-10-CM | POA: Diagnosis not present

## 2023-08-05 NOTE — Therapy (Signed)
 OUTPATIENT PHYSICAL THERAPY LOWER EXTREMITY TREATMENT   Patient Name: Olivia Ewing MRN: [id number] DOB:15-Feb-1950, 73 y.o., female Today's Date: 08/05/2023  END OF SESSION:  PT End of Session - 08/05/23 1448     Visit Number 3    Number of Visits 16    Date for PT Re-Evaluation 09/20/23    Authorization Type [facility] mcr    PT Start Time 1443    PT Stop Time 1523    PT Time Calculation (min) 40 min    Behavior During Therapy WFL for tasks assessed/performed          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   Steroid injections: 09/2013 12/2013    Lumbar radiculopathy, chronic    Nontraumatic incomplete tear of right rotator cuff 07/27/2019   Primary osteoarthritis of right hip 04/27/2018   Right knee meniscal tear 03/14/2011   Right knee pain    posterior horn medial meniscal tear MRI 2013   Septic arthritis (HCC) 02/25/2020   Spinal stenosis    getting epidural injections -last one 06/12/2015   Status post lumbar laminectomy 03/16/2019   Status post total hip replacement, 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   Past Surgical History:  Procedure Laterality Date   APPENDECTOMY     Bladder tack  10/13/2006   cystocoele   BREAST BIOPSY Right 1974   BREAST SURGERY Right    benign cyst   CARPAL TUNNEL RELEASE Left 10/25/2019   Procedure: LEFT CARPAL TUNNEL RELEASE;  Surgeon: Lucilla Lynwood BRAVO, MD;  Location: [facility];  Service: Orthopedics;  Laterality: Left;   COLONOSCOPY      IRRIGATION AND DEBRIDEMENT SHOULDER Right 02/21/2020   Procedure: IRRIGATION AND DEBRIDEMENT RIGHT SHOULDER;  Surgeon: Jerri Kay HERO, MD;  Location: [facility];  Service: Orthopedics;  Laterality: Right;   LUMBAR LAMINECTOMY N/A 03/16/2019   Procedure: CENTRAL LAMINECTOMIES L2-3, L3-4 AND L4-5;  Surgeon: Lucilla Lynwood BRAVO, MD;  Location: [facility];  Service: Orthopedics;  Laterality: N/A;   ROTATOR CUFF REPAIR     TOTAL HIP ARTHROPLASTY Right 04/27/2018   Procedure: RIGHT TOTAL HIP ARTHROPLASTY ANTERIOR APPROACH;  Surgeon: Jerri Kay HERO, MD;  Location: [facility];  Service: Orthopedics;  Laterality: Right;   TUBAL LIGATION     VAGINAL HYSTERECTOMY  2008   Patient Active Problem List   Diagnosis Date Noted   Neoplasm of uncertain behavior 06/30/2023   Vaginal discharge 06/30/2023   Prediabetes 06/13/2023   Osteopenia 03/17/2023   H/O Whipple procedure 12/19/2022   Polyp of duodenum 04/03/2022   Primary osteoarthritis of left knee 11/29/2021   Trigger finger, right ring finger 04/10/2021   Trigger finger, left ring finger 02/26/2021   Asthma 12/13/2020   GERD (gastroesophageal reflux disease) 12/13/2020   Housing instability 12/13/2020   Atypical squamous cells cannot exclude high grade squamous intraepithelial lesion on cytologic smear of cervix (ASC-H) 09/01/2020  De Quervain's disease (tenosynovitis) 08/22/2020   Insomnia 06/08/2020   No-show for appointment 02/25/2020   S/P arthroscopy of right shoulder 01/05/2020   Carpal tunnel syndrome, left upper limb 10/25/2019    Class: Chronic   Carpal tunnel syndrome, right upper limb 10/25/2019    Class: Chronic   Tendinopathy of right biceps tendon 07/27/2019   Breast cancer screening by mammogram 06/07/2019   Seasonal allergic rhinitis due to pollen 06/07/2019   Colon cancer screening 06/07/2019   Lumbar stenosis with neurogenic claudication 03/16/2019    Class: Chronic   Acute stress reaction 09/25/2018   Vitamin D   insufficiency 11/25/2013   Healthcare maintenance 11/25/2013   Depression 09/23/2011   Functional incontinence 10/18/2010   Pain in right leg 10/18/2010   Iron deficiency anemia 05/12/2008   Hyperlipemia 06/18/2007   Essential hypertension 05/20/2007    PCP: Izetta Masters DO  REFERRING PROVIDER: Renita Redell DASEN, MD   REFERRING DIAG: Primary osteoarthritis of left knee   THERAPY DIAG:  Chronic pain of left knee  Muscle weakness (generalized)  Other low back pain  Other abnormalities of gait and mobility  Rationale for Evaluation and Treatment: Rehabilitation  ONSET DATE: 4-5 years ago  SUBJECTIVE:   SUBJECTIVE STATEMENT: Pt reports she felt very relaxed after first session.  Went home and slept for hours afterwards.  She reports she had epidural injection in back yesterday.   Pt does not have pool access at this time  From initial evaluation:  Whipple procedure last August.  Needed to learn how to walk again.  R thr x 4 years ago, have spinal stenosis x 5 years with surgery. I wear a back brace everyday to supports my back. I have tingling, numbness and P&N in right hip through outside of foot. Get epidurals every 3 months which helps. I am so tired  of being in pain  PERTINENT HISTORY: Whipple Right THR Bilat Oa knees Lumbar stenosis PAIN:  Are you having pain? Yes: NPRS scale: 6/10 Pain location: left knee, lower back into RLE down to ankle Pain description: ache sharp Aggravating factors: sitting too long, standing 20 mins Relieving factors: heat, elevation     PRECAUTIONS: None  RED FLAGS: None   WEIGHT BEARING RESTRICTIONS: No  FALLS:  Has patient fallen in last 6 months? No  LIVING ENVIRONMENT: Lives with: lives with their daughter Lives in: House/apartment Stairs: Yes: Internal: 16 steps; on right going up Has following equipment at home: Single point cane  OCCUPATION: retired  PLOF: Independent  PATIENT GOALS: manage pain. Strengthening  general  NEXT MD VISIT: as needed  OBJECTIVE:  Note: Objective measures were completed at Evaluation unless otherwise noted.  DIAGNOSTIC FINDINGS: advanced tricompartmental osteoarthritis Left  PATIENT SURVEYS:  LEFS:31/80  COGNITION: Overall cognitive status: Within functional limits for tasks assessed     SENSATION: Numbness tingling; and P&N Right le  POSTURE: decreased lumbar lordosis, flexed trunk , and weight shift right  PALPATION: Moderate TTP left med knee joint line  LOWER EXTREMITY ROM:  HD (Lbs) Right eval Left eval  Hip flexion 28.0 13.1  Hip extension    Hip abduction 26.0 16.5  Hip adduction    Hip internal rotation    Hip external rotation    Knee flexion    Knee extension 26.6 14.8  Ankle dorsiflexion    Ankle plantarflexion    Ankle inversion    Ankle eversion     (Blank rows = not tested)  LOWER EXTREMITY MMT:  AROM Right  eval Left eval  Hip flexion    Hip extension    Hip abduction    Hip adduction    Hip internal rotation    Hip external rotation    Knee flexion  72  Knee extension  -20  Ankle dorsiflexion    Ankle plantarflexion    Ankle inversion    Ankle eversion     (Blank rows = not tested)  FUNCTIONAL TESTS:  Tug and Berg balance test to be completed as tolerated  GAIT: Distance walked: 400 ft Assistive device utilized: Single point cane Level of assistance: Complete Independence Comments: Antalgic lle off loading right, increased rue arm swing for momentum to advance lle hiking hip avoiding left knee flex during swing.                                                                                                                                TREATMENT  [facility] Adult PT Treatment:                                             08/05/23 Pt seen for aquatic therapy today.  Treatment took place in water 3.5-4.75 ft in depth at the [facility] pool. Temp of water was 91.  Pt entered/exited the pool via stairs  independently in step-to and step-through pattern with bilat rail.   - UE on barbell:  - walking forward across pool x 3 laps;  - side stepping x 3 laps - backwards x 1 lap - marching backwards/ forwards x 2.5 laps  - UE on wall at 4+ ft:  heel raises x 10 (hip hinge for toe raises twinged back); hip add/abd x 10 (cues to control height of LE);  relaxed squats x 5 -TrA set with 1/2 hollow noodle pull downs to front of thighs x 10 - seated in lift chair:cycling, LAQ with DF, hip abdct/ addct - return to walking forward/backward with yellow hand floats row motion, side stepping with horiz abdct/ addct  Pt requires the buoyancy and hydrostatic pressure of water for support, and to offload joints by unweighting joint load by at least 50 % in navel deep water and by at least 75-80% in chest to neck deep water.  Viscosity of the water is needed for resistance of strengthening. Water current perturbations provides challenge to standing balance requiring increased core activation.      PATIENT EDUCATION:  Education details: intro to aquatic therapy  Person educated: Patient Education method: Explanation Education comprehension: verbalized understanding  HOME EXERCISE PROGRAM: TBA Pt does not have pool access at this time  ASSESSMENT:  CLINICAL IMPRESSION: Pt demonstrates improved confidence in setting, but continues to require UE support on wall or floatation (ie: barbell). Pt able to complete laps of walking with UE support on barbell with minimal difficulty. Pt reported  reduction in knee pain and back pain to 0/10 at end of session. Plan for pt to begin alternating land/water on 7/17.  Will continue to progress as tolerated.  Goals are ongoing.    From initial evaluation:  Patient is a 73 y.o. f who was seen today for physical therapy evaluation and treatment for oa left knee.  Pt has vast pMhx including whipple procedure (1 year ago), lumbar surgery, carpel tunnel surgery,and right THR  that has left her with LE and core weakness.  She presents using cane stating that she keeps moving despite all of her pain because she will not give in.  She has moderate constant pain in LB through right hip with radicular pattern into right foot and left knee pain. She required several position changes throughout exam (sitting, standing and walking) to tolerate.  Not all functional testing completed but will plan on completion as able.  She is a good candidate for aquatic physical therapy intervention and will benefit from the properties of water to initially manage pain and then allow for toleration to strengthening, stretching of core and LE and balance retraining to improve overall safety with adl's and QOL.  OBJECTIVE IMPAIRMENTS: Abnormal gait, decreased activity tolerance, decreased balance, decreased endurance, decreased knowledge of use of DME, decreased mobility, difficulty walking, decreased ROM, decreased strength, impaired flexibility, postural dysfunction, and pain.   ACTIVITY LIMITATIONS: carrying, lifting, bending, sitting, standing, squatting, sleeping, stairs, transfers, and locomotion level  PARTICIPATION LIMITATIONS: meal prep, cleaning, laundry, shopping, community activity, occupation, and yard work  PERSONAL FACTORS: Past/current experiences, Time since onset of injury/illness/exacerbation, and 3+ comorbidities: see pmHx are also affecting patient's functional outcome.   REHAB POTENTIAL: Good  CLINICAL DECISION MAKING: Unstable/unpredictable  EVALUATION COMPLEXITY: High   GOALS: Goals reviewed with patient? Yes  SHORT TERM GOALS: Target date: 08/20/23 Pt will tolerate full aquatic sessions consistently without increase in pain and with improving function to demonstrate good toleration and effectiveness of intervention.  Baseline: Goal status: INITIAL  2.  Pt will improve left knee flex to 90d and extension to -10 Baseline:  Goal status: INITIAL  3.  Pt will  tolerate walking to and from setting and engaging in aquatic therapy session without excessive fatigue or increase in pain to demonstrate improved toleration to activity Baseline:  Goal status: INITIAL  4.  TUG and Berg balance  functional testing to be complete/tolerated. Baseline: unable to tolerate Goal status: INITIAL  5.  Pt will tolerate stair climbing using alternating or step to pattern ascending and descending 7 steps with use of handrail Baseline:  Goal status: INITIAL   LONG TERM GOALS: Target date: 09/20/23  Pt to improve on LEFS by at least 9 point to demonstrate statistically significant Improvement in function. Baseline:  Goal status: INITIAL  2.   Baseline:  Goal status: INITIAL  3.  Pt will report decrease in pain by at least 50% for improved toleration to activity/quality of life and to demonstrate improved management of pain. Baseline: see chart Goal status: INITIAL  4.  Pt will improve strength in LLE by at least 10 lbs to demonstrate improved overall physical function Baseline: see chart Goal status: INITIAL  5.  Pt will improve on Berg balance test to >/= 45/56 to demonstrate a low fall risk. Baseline: TBT Goal status: INITIAL     PLAN:  PT FREQUENCY: 2x/week  PT DURATION: 8 weeks  PLANNED INTERVENTIONS: 97164- PT Re-evaluation, 97750- Physical Performance Testing, 97110-Therapeutic exercises, 97530- Therapeutic activity, [id number]- Neuromuscular  re-education, [phone]- Self Care, 02859- Manual therapy, [phone]- Gait training, [phone]- Orthotic Initial, [phone]- Aquatic Therapy, [phone]- Electrical stimulation (unattended), [phone]- Electrical stimulation (manual), [phone] (1-2 muscles), 20561 (3+ muscles)- Dry Needling, Patient/Family education, Balance training, Stair training, Taping, Joint mobilization, DME instructions, Cryotherapy, and Moist heat  PLAN FOR NEXT SESSION: Aquatic for stretching and strengthening of core and le; balance retraining, stair climbing,  gait and posture. Delon Aquas, PTA 08/05/23 3:29 PM [facility] [address] [place], [place], [address] Phone: [phone]   Fax:  [phone]    Date of referral: 06/06/23 Referring provider: Renita Redell DASEN, MD  Referring diagnosis? Primary osteoarthritis of left knee  Treatment diagnosis? (if different than referring diagnosis) no but added LBP  What was this (referring dx) caused by? Arthritis  Nature of Condition: Chronic (continuous duration > 3 months)   Laterality: Lt  Current Functional Measure Score: LEFS 31/80  Objective measurements identify impairments when they are compared to normal values, the uninvolved extremity, and prior level of function.  [x]  Yes  []  No  Objective assessment of functional ability: Moderate functional limitations   Briefly describe symptoms: Chronic left knee pain due to OA with limited ROM and strength  How did symptoms start: chronic  Average pain intensity:  Last 24 hours: 10/10  Past week: 10/10  How often does the pt experience symptoms? Constantly  How much have the symptoms interfered with usual daily activities? Quite a bit  How has condition changed since care began at this facility? NA - initial visit  In general, how is the patients overall health? Fair   BACK PAIN (STarT Back Screening Tool) Has pain spread down the leg(s) at some time in the last 2 weeks? yes Has there been pain in the shoulder or neck at some time in the last 2 weeks? no Has the pt only walked short distances because of back pain? yes Has patient dressed more slowly because of back pain in the past 2 weeks? yes Does patient think it's not safe for a person with this condition to be physically active? no Does patient have worrying thoughts a lot of the time? no Does patient feel back pain is terrible and will never get any better? yes Has patient stopped enjoying things they usually  enjoy? yes

## 2023-08-12 ENCOUNTER — Ambulatory Visit (HOSPITAL_BASED_OUTPATIENT_CLINIC_OR_DEPARTMENT_OTHER): Admitting: Physical Therapy

## 2023-08-18 ENCOUNTER — Encounter (HOSPITAL_BASED_OUTPATIENT_CLINIC_OR_DEPARTMENT_OTHER): Payer: Self-pay | Admitting: Physical Therapy

## 2023-08-18 ENCOUNTER — Ambulatory Visit (HOSPITAL_BASED_OUTPATIENT_CLINIC_OR_DEPARTMENT_OTHER): Attending: Pain Medicine | Admitting: Physical Therapy

## 2023-08-18 DIAGNOSIS — R2689 Other abnormalities of gait and mobility: Secondary | ICD-10-CM | POA: Insufficient documentation

## 2023-08-18 DIAGNOSIS — G8929 Other chronic pain: Secondary | ICD-10-CM | POA: Diagnosis not present

## 2023-08-18 DIAGNOSIS — M5459 Other low back pain: Secondary | ICD-10-CM | POA: Insufficient documentation

## 2023-08-18 DIAGNOSIS — M25562 Pain in left knee: Secondary | ICD-10-CM | POA: Diagnosis not present

## 2023-08-18 DIAGNOSIS — M6281 Muscle weakness (generalized): Secondary | ICD-10-CM | POA: Insufficient documentation

## 2023-08-18 NOTE — Therapy (Signed)
 OUTPATIENT PHYSICAL THERAPY LOWER EXTREMITY TREATMENT   Patient Name: Olivia Ewing MRN: [id number] DOB:01/24/1951, 73 y.o., female Today's Date: 08/18/2023  END OF SESSION:  PT End of Session - 08/18/23 1526     Visit Number 4    Number of Visits 16    Date for PT Re-Evaluation 09/20/23    Authorization Type [facility]    PT Start Time 1518    PT Stop Time 1600    PT Time Calculation (min) 42 min    Activity Tolerance Patient tolerated treatment well    Behavior During Therapy WFL for tasks assessed/performed          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   Steroid injections: 09/2013 12/2013    Lumbar radiculopathy, chronic    Nontraumatic incomplete tear of right rotator cuff 07/27/2019   Primary osteoarthritis of right hip 04/27/2018   Right knee meniscal tear 03/14/2011   Right knee pain    posterior horn medial meniscal tear MRI 2013   Septic arthritis (HCC) 02/25/2020   Spinal stenosis    getting epidural injections -last one 06/12/2015   Status post lumbar laminectomy 03/16/2019   Status post total hip replacement, 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   Past Surgical History:  Procedure Laterality Date   APPENDECTOMY     Bladder tack  10/13/2006   cystocoele   BREAST BIOPSY Right 1974   BREAST SURGERY Right    benign cyst   CARPAL TUNNEL RELEASE Left 10/25/2019   Procedure: LEFT CARPAL TUNNEL RELEASE;  Surgeon: Lucilla Lynwood BRAVO, MD;  Location: [facility];   Service: Orthopedics;  Laterality: Left;   COLONOSCOPY     IRRIGATION AND DEBRIDEMENT SHOULDER Right 02/21/2020   Procedure: IRRIGATION AND DEBRIDEMENT RIGHT SHOULDER;  Surgeon: Jerri Kay HERO, MD;  Location:  SUR[facility]ERY [facility];  Service: Orthopedics;  Laterality: Right;   LUMBAR LAMINECTOMY N/A 03/16/2019   Procedure: CENTRAL LAMINECTOMIES L2-3, L3-4 AND L4-5;  Surgeon: Lucilla Lynwood BRAVO, MD;  Location: [facility] OR;  Service: Orthopedics;  Laterality: N/A;   ROTATOR CUFF REPAIR     TOTAL HIP ARTHROPLASTY Right 04/27/2018   Procedure: RIGHT TOTAL HIP ARTHROPLASTY ANTERIOR APPROACH;  Surgeon: Jerri Kay HERO, MD;  Location: [facility] OR;  Service: Orthopedics;  Laterality: Right;   TUBAL LIGATION     VAGINAL HYSTERECTOMY  2008   Patient Active Problem List   Diagnosis Date Noted   Neoplasm of uncertain behavior 06/30/2023   Vaginal discharge 06/30/2023   Prediabetes 06/13/2023   Osteopenia 03/17/2023   H/O Whipple procedure 12/19/2022   Polyp of duodenum 04/03/2022   Primary osteoarthritis of left knee 11/29/2021   Trigger finger, right ring finger 04/10/2021   Trigger finger, left ring finger 02/26/2021   Asthma 12/13/2020   GERD (gastroesophageal reflux disease) 12/13/2020   Housing instability 12/13/2020   Atypical squamous cells cannot exclude high grade squamous intraepithelial  lesion on cytologic smear of cervix (ASC-H) 09/01/2020   Everitt Quervain's disease (tenosynovitis) 08/22/2020   Insomnia 06/08/2020   No-show for appointment 02/25/2020   S/P arthroscopy of right shoulder 01/05/2020   Carpal tunnel syndrome, left upper limb 10/25/2019    Class: Chronic   Carpal tunnel syndrome, right upper limb 10/25/2019    Class: Chronic   Tendinopathy of right biceps tendon 07/27/2019   Breast cancer screening by mammogram 06/07/2019   Seasonal allergic rhinitis due to pollen 06/07/2019   Colon cancer screening 06/07/2019   Lumbar stenosis with neurogenic claudication 03/16/2019    Class:  Chronic   Acute stress reaction 09/25/2018   Vitamin D  insufficiency 11/25/2013   Healthcare maintenance 11/25/2013   Depression 09/23/2011   Functional incontinence 10/18/2010   Pain in right leg 10/18/2010   Iron deficiency anemia 05/12/2008   Hyperlipemia 06/18/2007   Essential hypertension 05/20/2007    PCP: Izetta Masters DO  REFERRING PROVIDER: Renita Redell DASEN, MD   REFERRING DIAG: Primary osteoarthritis of left knee   THERAPY DIAG:  Chronic pain of left knee  Muscle weakness (generalized)  Other low back pain  Rationale for Evaluation and Treatment: Rehabilitation  ONSET DATE: 4-5 years ago  SUBJECTIVE:   SUBJECTIVE STATEMENT: Pt reports she is feeling good between the steroid injection and the pool therapy   Pt does not have pool access at this time  From initial evaluation:  Whipple procedure last August.  Needed to learn how to walk again.  R thr x 4 years ago, have spinal stenosis x 5 years with surgery. I wear a back brace everyday to supports my back. I have tingling, numbness and P&N in right hip through outside of foot. Get epidurals every 3 months which helps. I am so tired  of being in pain  PERTINENT HISTORY: Whipple Right THR Bilat Oa knees Lumbar stenosis PAIN:  Are you having pain? Yes: NPRS scale: 2/10 Pain location: left knee, lower back into RLE down to ankle Pain description: ache sharp Aggravating factors: sitting too long, standing 20 mins Relieving factors: heat, elevation     PRECAUTIONS: None  RED FLAGS: None   WEIGHT BEARING RESTRICTIONS: No  FALLS:  Has patient fallen in last 6 months? No  LIVING ENVIRONMENT: Lives with: lives with their daughter Lives in: House/apartment Stairs: Yes: Internal: 16 steps; on right going up Has following equipment at home: Single point cane  OCCUPATION: retired  PLOF: Independent  PATIENT GOALS: manage pain. Strengthening general  NEXT MD VISIT: as needed  OBJECTIVE:  Note:  Objective measures were completed at Evaluation unless otherwise noted.  DIAGNOSTIC FINDINGS: advanced tricompartmental osteoarthritis Left  PATIENT SURVEYS:  LEFS:31/80  COGNITION: Overall cognitive status: Within functional limits for tasks assessed     SENSATION: Numbness tingling; and P&N Right le  POSTURE: decreased lumbar lordosis, flexed trunk , and weight shift right  PALPATION: Moderate TTP left med knee joint line  LOWER EXTREMITY ROM:  HD (Lbs) Right eval Left eval  Hip flexion 28.0 13.1  Hip extension    Hip abduction 26.0 16.5  Hip adduction    Hip internal rotation    Hip external rotation    Knee flexion    Knee extension 26.6 14.8  Ankle dorsiflexion    Ankle plantarflexion    Ankle inversion    Ankle eversion     (Blank rows = not tested)  LOWER EXTREMITY MMT:  AROM Right eval Left eval  Hip flexion    Hip  extension    Hip abduction    Hip adduction    Hip internal rotation    Hip external rotation    Knee flexion  72  Knee extension  -20  Ankle dorsiflexion    Ankle plantarflexion    Ankle inversion    Ankle eversion     (Blank rows = not tested)  FUNCTIONAL TESTS:  Tug and Berg balance test to be completed as tolerated  GAIT: Distance walked: 400 ft Assistive device utilized: Single point cane Level of assistance: Complete Independence Comments: Antalgic lle off loading right, increased rue arm swing for momentum to advance lle hiking hip avoiding left knee flex during swing.                                                                                                                                TREATMENT  [facility] Adult PT Treatment:                                             08/18/23 Pt seen for aquatic therapy today.  Treatment took place in water 3.5-4.75 ft in depth at the [facility] pool. Temp of water was 91.  Pt entered/exited the pool via stairs independently in step-to and step-through pattern with bilat  rail.   - UE on yellow noodle walking forward/back and side stepping across pool   - 2 widths unsupported - Standing ue support yellow noodle at 3.6  ft:  heel raises x 10; toe raises; hip add/abd x 10 (cues to control height of LE);  high knee march x10; relaxed squats x 10 -STS from bench onto floor x 10-> onto water step 2 x 5 -TrA set with 1/2 hollow noodle pull downs to front of thighs wide stance then staggered x 10 - seated in lift chair:cycling, LAQ with DF, hip abdct/ addct - return to walking forward/backward with yellow hand floats row motion, side stepping with horiz abdct/ addct  Pt requires the buoyancy and hydrostatic pressure of water for support, and to offload joints by unweighting joint load by at least 50 % in navel deep water and by at least 75-80% in chest to neck deep water.  Viscosity of the water is needed for resistance of strengthening. Water current perturbations provides challenge to standing balance requiring increased core activation.      PATIENT EDUCATION:  Education details: intro to aquatic therapy  Person educated: Patient Education method: Explanation Education comprehension: verbalized understanding  HOME EXERCISE PROGRAM: TBA Pt does not have pool access at this time  ASSESSMENT:  CLINICAL IMPRESSION: Pt has had improvement in left knee pain with reduction down ~ 4 NPRS. Her dynamic balance is improving as pt amb submerged without UE support and without LOB.  She negotiates stairs using step to pattern with cues on proper  execution without difficulty.  2 STG met today. Good progress     From initial evaluation:  Patient is a 73 y.o. f who was seen today for physical therapy evaluation and treatment for oa left knee.  Pt has vast pMhx including whipple procedure (1 year ago), lumbar surgery, carpel tunnel surgery,and right THR that has left her with LE and core weakness.  She presents using cane stating that she keeps moving despite all of her  pain because she will not give in.  She has moderate constant pain in LB through right hip with radicular pattern into right foot and left knee pain. She required several position changes throughout exam (sitting, standing and walking) to tolerate.  Not all functional testing completed but will plan on completion as able.  She is a good candidate for aquatic physical therapy intervention and will benefit from the properties of water to initially manage pain and then allow for toleration to strengthening, stretching of core and LE and balance retraining to improve overall safety with adl's and QOL.  OBJECTIVE IMPAIRMENTS: Abnormal gait, decreased activity tolerance, decreased balance, decreased endurance, decreased knowledge of use of DME, decreased mobility, difficulty walking, decreased ROM, decreased strength, impaired flexibility, postural dysfunction, and pain.   ACTIVITY LIMITATIONS: carrying, lifting, bending, sitting, standing, squatting, sleeping, stairs, transfers, and locomotion level  PARTICIPATION LIMITATIONS: meal prep, cleaning, laundry, shopping, community activity, occupation, and yard work  PERSONAL FACTORS: Past/current experiences, Time since onset of injury/illness/exacerbation, and 3+ comorbidities: see pmHx are also affecting patient's functional outcome.   REHAB POTENTIAL: Good  CLINICAL DECISION MAKING: Unstable/unpredictable  EVALUATION COMPLEXITY: High   GOALS: Goals reviewed with patient? Yes  SHORT TERM GOALS: Target date: 08/20/23 Pt will tolerate full aquatic sessions consistently without increase in pain and with improving function to demonstrate good toleration and effectiveness of intervention.  Baseline: Goal status: Met 08/18/23  2.  Pt will improve left knee flex to 90d and extension to -10 Baseline:  Goal status: INITIAL  3.  Pt will tolerate walking to and from setting and engaging in aquatic therapy session without excessive fatigue or increase in pain  to demonstrate improved toleration to activity Baseline:  Goal status: INITIAL  4.  TUG and Berg balance  functional testing to be complete/tolerated. Baseline: unable to tolerate Goal status: INITIAL  5.  Pt will tolerate stair climbing using alternating or step to pattern ascending and descending 7 steps with use of handrail Baseline:  Goal status: Met 08/18/23   LONG TERM GOALS: Target date: 09/20/23  Pt to improve on LEFS by at least 9 point to demonstrate statistically significant Improvement in function. Baseline:  Goal status: INITIAL  2.  IN ERROR Baseline:  Goal status: INITIAL  3.  Pt will report decrease in pain by at least 50% for improved toleration to activity/quality of life and to demonstrate improved management of pain. Baseline: see chart Goal status: INITIAL  4.  Pt will improve strength in LLE by at least 10 lbs to demonstrate improved overall physical function Baseline: see chart Goal status: INITIAL  5.  Pt will improve on Berg balance test to >/= 45/56 to demonstrate a low fall risk. Baseline: TBT Goal status: INITIAL     PLAN:  PT FREQUENCY: 2x/week  PT DURATION: 8 weeks  PLANNED INTERVENTIONS: 97164- PT Re-evaluation, 97750- Physical Performance Testing, 97110-Therapeutic exercises, 97530- Therapeutic activity, [id number]- Neuromuscular re-education, 97535- Self Care, [id number]- Manual therapy, [id number]- Gait training, [phone]- Orthotic Initial, [phone]- Aquatic Therapy, [phone]- Electrical stimulation (  unattended), [phone]- Electrical stimulation (manual), 79439 (1-2 muscles), [id number] (3+ muscles)- Dry Needling, Patient/Family education, Balance training, Stair training, Taping, Joint mobilization, DME instructions, Cryotherapy, and Moist heat  PLAN FOR NEXT SESSION: Aquatic for stretching and strengthening of core and le; balance retraining, stair climbing, gait and posture. Delon Aquas, PTA 08/18/23 3:27 PM [facility] [address] [place], [place], [address] Phone: [phone]   Fax:  [phone]    Date of referral: 06/06/23 Referring provider: Renita Redell DASEN, MD  Referring diagnosis? Primary osteoarthritis of left knee  Treatment diagnosis? (if different than referring diagnosis) no but added LBP  What was this (referring dx) caused by? Arthritis  Nature of Condition: Chronic (continuous duration > 3 months)   Laterality: Lt  Current Functional Measure Score: LEFS 31/80  Objective measurements identify impairments when they are compared to normal values, the uninvolved extremity, and prior level of function.  [x]  Yes  []  No  Objective assessment of functional ability: Moderate functional limitations   Briefly describe symptoms: Chronic left knee pain due to OA with limited ROM and strength  How did symptoms start: chronic  Average pain intensity:  Last 24 hours: 10/10  Past week: 10/10  How often does the pt experience symptoms? Constantly  How much have the symptoms interfered with usual daily activities? Quite a bit  How has condition changed since care began at this facility? NA - initial visit  In general, how is the patients overall health? Fair   BACK PAIN (STarT Back Screening Tool) Has pain spread down the leg(s) at some time in the last 2 weeks? yes Has there been pain in the shoulder or neck at some time in the last 2 weeks? no Has the pt only walked short distances because of back pain? yes Has patient dressed more slowly because of back pain in the past 2 weeks? yes Does patient think it's not safe for a person with this condition to be physically active? no Does patient have worrying thoughts a lot of the time? no Does patient feel back pain is terrible and will never get any better? yes Has patient stopped enjoying things they usually enjoy? yes

## 2023-08-20 ENCOUNTER — Encounter (HOSPITAL_BASED_OUTPATIENT_CLINIC_OR_DEPARTMENT_OTHER): Payer: Self-pay | Admitting: Physical Therapy

## 2023-08-20 ENCOUNTER — Ambulatory Visit (HOSPITAL_BASED_OUTPATIENT_CLINIC_OR_DEPARTMENT_OTHER): Admitting: Physical Therapy

## 2023-08-20 DIAGNOSIS — R2689 Other abnormalities of gait and mobility: Secondary | ICD-10-CM

## 2023-08-20 DIAGNOSIS — M5459 Other low back pain: Secondary | ICD-10-CM

## 2023-08-20 DIAGNOSIS — M6281 Muscle weakness (generalized): Secondary | ICD-10-CM

## 2023-08-20 DIAGNOSIS — M25562 Pain in left knee: Secondary | ICD-10-CM | POA: Diagnosis not present

## 2023-08-20 DIAGNOSIS — G8929 Other chronic pain: Secondary | ICD-10-CM | POA: Diagnosis not present

## 2023-08-20 NOTE — Therapy (Signed)
 OUTPATIENT PHYSICAL THERAPY LOWER EXTREMITY TREATMENT   Patient Name: Olivia Ewing MRN: [id number] DOB:24-Oct-1950, 73 y.o., female Today's Date: 08/20/2023  END OF SESSION:  PT End of Session - 08/20/23 1343     Visit Number 5    Number of Visits 16    Date for PT Re-Evaluation 09/20/23    Authorization Type [facility]    PT Start Time 1345    PT Stop Time 1425    PT Time Calculation (min) 40 min    Activity Tolerance Patient tolerated treatment well    Behavior During Therapy WFL for tasks assessed/performed          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   Steroid injections: 09/2013 12/2013    Lumbar radiculopathy, chronic    Nontraumatic incomplete tear of right rotator cuff 07/27/2019   Primary osteoarthritis of right hip 04/27/2018   Right knee meniscal tear 03/14/2011   Right knee pain    posterior horn medial meniscal tear MRI 2013   Septic arthritis (HCC) 02/25/2020   Spinal stenosis    getting epidural injections -last one 06/12/2015   Status post lumbar laminectomy 03/16/2019   Status post total hip replacement, 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   Past Surgical History:  Procedure Laterality Date   APPENDECTOMY     Bladder tack  10/13/2006   cystocoele   BREAST BIOPSY Right 1974   BREAST SURGERY Right    benign cyst   CARPAL TUNNEL RELEASE Left 10/25/2019   Procedure: LEFT CARPAL TUNNEL RELEASE;  Surgeon: Lucilla Lynwood BRAVO, MD;  Location: [facility];   Service: Orthopedics;  Laterality: Left;   COLONOSCOPY     IRRIGATION AND DEBRIDEMENT SHOULDER Right 02/21/2020   Procedure: IRRIGATION AND DEBRIDEMENT RIGHT SHOULDER;  Surgeon: Jerri Kay HERO, MD;  Location: [facility];  Service: Orthopedics;  Laterality: Right;   LUMBAR LAMINECTOMY N/A 03/16/2019   Procedure: CENTRAL LAMINECTOMIES L2-3, L3-4 AND L4-5;  Surgeon: Lucilla Lynwood BRAVO, MD;  Location: [facility];  Service: Orthopedics;  Laterality: N/A;   ROTATOR CUFF REPAIR     TOTAL HIP ARTHROPLASTY Right 04/27/2018   Procedure: RIGHT TOTAL HIP ARTHROPLASTY ANTERIOR APPROACH;  Surgeon: Jerri Kay HERO, MD;  Location: [facility];  Service: Orthopedics;  Laterality: Right;   TUBAL LIGATION     VAGINAL HYSTERECTOMY  2008   Patient Active Problem List   Diagnosis Date Noted   Neoplasm of uncertain behavior 06/30/2023   Vaginal discharge 06/30/2023   Prediabetes 06/13/2023   Osteopenia 03/17/2023   H/O Whipple procedure 12/19/2022   Polyp of duodenum 04/03/2022   Primary osteoarthritis of left knee 11/29/2021   Trigger finger, right ring finger 04/10/2021   Trigger finger, left ring finger 02/26/2021   Asthma 12/13/2020   GERD (gastroesophageal reflux disease) 12/13/2020   Housing instability 12/13/2020   Atypical squamous cells cannot exclude high grade squamous intraepithelial  lesion on cytologic smear of cervix (ASC-H) 09/01/2020   Everitt Quervain's disease (tenosynovitis) 08/22/2020   Insomnia 06/08/2020   No-show for appointment 02/25/2020   S/P arthroscopy of right shoulder 01/05/2020   Carpal tunnel syndrome, left upper limb 10/25/2019    Class: Chronic   Carpal tunnel syndrome, right upper limb 10/25/2019    Class: Chronic   Tendinopathy of right biceps tendon 07/27/2019   Breast cancer screening by mammogram 06/07/2019   Seasonal allergic rhinitis due to pollen 06/07/2019   Colon cancer screening 06/07/2019   Lumbar stenosis with neurogenic claudication 03/16/2019    Class:  Chronic   Acute stress reaction 09/25/2018   Vitamin D  insufficiency 11/25/2013   Healthcare maintenance 11/25/2013   Depression 09/23/2011   Functional incontinence 10/18/2010   Pain in right leg 10/18/2010   Iron deficiency anemia 05/12/2008   Hyperlipemia 06/18/2007   Essential hypertension 05/20/2007    PCP: Izetta Masters DO  REFERRING PROVIDER: Renita Redell DASEN, MD   REFERRING DIAG: Primary osteoarthritis of left knee   THERAPY DIAG:  Chronic pain of left knee  Muscle weakness (generalized)  Other low back pain  Other abnormalities of gait and mobility  Rationale for Evaluation and Treatment: Rehabilitation  ONSET DATE: 4-5 years ago  SUBJECTIVE:   SUBJECTIVE STATEMENT: Pt reports increase in left knee pain today 8/10.  Has been on her feet for since 7am.  Pt does not have pool access at this time  From initial evaluation:  Whipple procedure last August.  Needed to learn how to walk again.  R thr x 4 years ago, have spinal stenosis x 5 years with surgery. I wear a back brace everyday to supports my back. I have tingling, numbness and P&N in right hip through outside of foot. Get epidurals every 3 months which helps. I am so tired  of being in pain  PERTINENT HISTORY: Whipple Right THR Bilat Oa knees Lumbar stenosis PAIN:  Are you having pain? Yes: NPRS scale: 8/10 Pain location: left knee, lower back into RLE down to ankle Pain description: ache sharp Aggravating factors: sitting too long, standing 20 mins Relieving factors: heat, elevation     PRECAUTIONS: None  RED FLAGS: None   WEIGHT BEARING RESTRICTIONS: No  FALLS:  Has patient fallen in last 6 months? No  LIVING ENVIRONMENT: Lives with: lives with their daughter Lives in: House/apartment Stairs: Yes: Internal: 16 steps; on right going up Has following equipment at home: Single point cane  OCCUPATION: retired  PLOF: Independent  PATIENT GOALS: manage pain. Strengthening  general  NEXT MD VISIT: as needed  OBJECTIVE:  Note: Objective measures were completed at Evaluation unless otherwise noted.  DIAGNOSTIC FINDINGS: advanced tricompartmental osteoarthritis Left  PATIENT SURVEYS:  LEFS:31/80  COGNITION: Overall cognitive status: Within functional limits for tasks assessed     SENSATION: Numbness tingling; and P&N Right le  POSTURE: decreased lumbar lordosis, flexed trunk , and weight shift right  PALPATION: Moderate TTP left med knee joint line  LOWER EXTREMITY ROM:  HD (Lbs) Right eval Left eval  Hip flexion 28.0 13.1  Hip extension    Hip abduction 26.0 16.5  Hip adduction    Hip internal rotation    Hip external rotation    Knee flexion    Knee extension 26.6 14.8  Ankle dorsiflexion    Ankle plantarflexion    Ankle inversion    Ankle eversion     (Blank rows = not tested)  LOWER EXTREMITY MMT:  AROM Right  eval Left eval  Hip flexion    Hip extension    Hip abduction    Hip adduction    Hip internal rotation    Hip external rotation    Knee flexion  72  Knee extension  -20  Ankle dorsiflexion    Ankle plantarflexion    Ankle inversion    Ankle eversion     (Blank rows = not tested)  FUNCTIONAL TESTS:  Tug and Berg balance test to be completed as tolerated  GAIT: Distance walked: 400 ft Assistive device utilized: Single point cane Level of assistance: Complete Independence Comments: Antalgic lle off loading right, increased rue arm swing for momentum to advance lle hiking hip avoiding left knee flex during swing.                                                                                                                                TREATMENT  [facility] Adult PT Treatment:                                             08/20/23 Pt seen for aquatic therapy today.  Treatment took place in water 3.5-4.75 ft in depth at the [facility] pool. Temp of water was 91.  Pt entered/exited the pool via stairs  independently in step-to and step-through pattern with bilat rail.   - UE on yellow noodle walking forward/back and side stepping across pool   - Standing ue support yellow noodle at 3.6  ft:  heel raises x 10; toe raises x10. High knee mar ching not tolerated due to pain -Seated rest on lift->hip add/abd; LAQ; cycling - return to walking forward/backward with barbell 4.39ft -standing ue support wall 4.4 ft: hip extension; HS curls -walking forward, back and side stepping   Pt requires the buoyancy and hydrostatic pressure of water for support, and to offload joints by unweighting joint load by at least 50 % in navel deep water and by at least 75-80% in chest to neck deep water.  Viscosity of the water is needed for resistance of strengthening. Water current perturbations provides challenge to standing balance requiring increased core activation.      PATIENT EDUCATION:  Education details: intro to aquatic therapy  Person educated: Patient Education method: Explanation Education comprehension: verbalized understanding  HOME EXERCISE PROGRAM: TBA Pt does not have pool access at this time  ASSESSMENT:  CLINICAL IMPRESSION: Increase in pain today. Left knee warm to touch and edematous. Dialed back activity for toleration. She has a small emotional breakdown but once encouraged she is able to continue with session.  She did use lift to exit pool to avoid onset off pain post session which was successful in reducing pain by 2-3 NPRS.  Pt edu on use of a knee sleeve as well as elevating once at  home for support and reduction in edema. Despite pain today she continues to progress with therapy as she is tolerating walking to and from setting without excessive fatigue or pain. Will plan on taking joint measurements and completing functional testing when tolerated       From initial evaluation:  Patient is a 73 y.o. f who was seen today for physical therapy evaluation and treatment for oa  left knee.  Pt has vast pMhx including whipple procedure (1 year ago), lumbar surgery, carpel tunnel surgery,and right THR that has left her with LE and core weakness.  She presents using cane stating that she keeps moving despite all of her pain because she will not give in.  She has moderate constant pain in LB through right hip with radicular pattern into right foot and left knee pain. She required several position changes throughout exam (sitting, standing and walking) to tolerate.  Not all functional testing completed but will plan on completion as able.  She is a good candidate for aquatic physical therapy intervention and will benefit from the properties of water to initially manage pain and then allow for toleration to strengthening, stretching of core and LE and balance retraining to improve overall safety with adl's and QOL.  OBJECTIVE IMPAIRMENTS: Abnormal gait, decreased activity tolerance, decreased balance, decreased endurance, decreased knowledge of use of DME, decreased mobility, difficulty walking, decreased ROM, decreased strength, impaired flexibility, postural dysfunction, and pain.   ACTIVITY LIMITATIONS: carrying, lifting, bending, sitting, standing, squatting, sleeping, stairs, transfers, and locomotion level  PARTICIPATION LIMITATIONS: meal prep, cleaning, laundry, shopping, community activity, occupation, and yard work  PERSONAL FACTORS: Past/current experiences, Time since onset of injury/illness/exacerbation, and 3+ comorbidities: see pmHx are also affecting patient's functional outcome.   REHAB POTENTIAL: Good  CLINICAL DECISION MAKING: Unstable/unpredictable  EVALUATION COMPLEXITY: High   GOALS: Goals reviewed with patient? Yes  SHORT TERM GOALS: Target date: 08/20/23 Pt will tolerate full aquatic sessions consistently without increase in pain and with improving function to demonstrate good toleration and effectiveness of intervention.  Baseline: Goal status: Met  08/18/23  2.  Pt will improve left knee flex to 90d and extension to -10 Baseline:  Goal status: In progress 08/20/23  3.  Pt will tolerate walking to and from setting and engaging in aquatic therapy session without excessive fatigue or increase in pain to demonstrate improved toleration to activity Baseline:  Goal status: Met 08/20/23  4.  TUG and Berg balance  functional testing to be complete/tolerated. Baseline: unable to tolerate Goal status: INITIAL  5.  Pt will tolerate stair climbing using alternating or step to pattern ascending and descending 7 steps with use of handrail Baseline:  Goal status: Met 08/18/23   LONG TERM GOALS: Target date: 09/20/23  Pt to improve on LEFS by at least 9 point to demonstrate statistically significant Improvement in function. Baseline:  Goal status: INITIAL  2.  IN ERROR Baseline:  Goal status: INITIAL  3.  Pt will report decrease in pain by at least 50% for improved toleration to activity/quality of life and to demonstrate improved management of pain. Baseline: see chart Goal status: INITIAL  4.  Pt will improve strength in LLE by at least 10 lbs to demonstrate improved overall physical function Baseline: see chart Goal status: INITIAL  5.  Pt will improve on Berg balance test to >/= 45/56 to demonstrate a low fall risk. Baseline: TBT Goal status: INITIAL     PLAN:  PT FREQUENCY: 2x/week  PT DURATION: 8  weeks  PLANNED INTERVENTIONS: 97164- PT Re-evaluation, 97750- Physical Performance Testing, 97110-Therapeutic exercises, 97530- Therapeutic activity, [id number]- Neuromuscular re-education, [phone]- Self Care, 02859- Manual therapy, [phone]- Gait training, [phone]- Orthotic Initial, [phone]- Aquatic Therapy, [phone]- Electrical stimulation (unattended), [phone]- Electrical stimulation (manual), [phone] (1-2 muscles), [phone] (3+ muscles)- Dry Needling, Patient/Family education, Balance training, Stair training, Taping, Joint mobilization, DME instructions,  Cryotherapy, and Moist heat  PLAN FOR NEXT SESSION: Aquatic for stretching and strengthening of core and le; balance retraining, stair climbing, gait and posture.   Ronal [place]) Odai Wimmer MPT 08/20/23 2:34 PM [facility] [address] [place], [place], [address] Phone: [phone]   Fax:  [phone]     Date of referral: 06/06/23 Referring provider: Renita Redell DASEN, MD  Referring diagnosis? Primary osteoarthritis of left knee  Treatment diagnosis? (if different than referring diagnosis) no but added LBP  What was this (referring dx) caused by? Arthritis  Nature of Condition: Chronic (continuous duration > 3 months)   Laterality: Lt  Current Functional Measure Score: LEFS 31/80  Objective measurements identify impairments when they are compared to normal values, the uninvolved extremity, and prior level of function.  [x]  Yes  []  No  Objective assessment of functional ability: Moderate functional limitations   Briefly describe symptoms: Chronic left knee pain due to OA with limited ROM and strength  How did symptoms start: chronic  Average pain intensity:  Last 24 hours: 10/10  Past week: 10/10  How often does the pt experience symptoms? Constantly  How much have the symptoms interfered with usual daily activities? Quite a bit  How has condition changed since care began at this facility? NA - initial visit  In general, how is the patients overall health? Fair   BACK PAIN (STarT Back Screening Tool) Has pain spread down the leg(s) at some time in the last 2 weeks? yes Has there been pain in the shoulder or neck at some time in the last 2 weeks? no Has the pt only walked short distances because of back pain? yes Has patient dressed more slowly because of back pain in the past 2 weeks? yes Does patient think it's not safe for a person with this condition to be physically active? no Does patient have worrying  thoughts a lot of the time? no Does patient feel back pain is terrible and will never get any better? yes Has patient stopped enjoying things they usually enjoy? yes

## 2023-08-26 ENCOUNTER — Encounter (HOSPITAL_BASED_OUTPATIENT_CLINIC_OR_DEPARTMENT_OTHER): Payer: Self-pay | Admitting: Physical Therapy

## 2023-08-26 ENCOUNTER — Ambulatory Visit (HOSPITAL_BASED_OUTPATIENT_CLINIC_OR_DEPARTMENT_OTHER): Admitting: Physical Therapy

## 2023-08-26 DIAGNOSIS — M6281 Muscle weakness (generalized): Secondary | ICD-10-CM

## 2023-08-26 DIAGNOSIS — G8929 Other chronic pain: Secondary | ICD-10-CM

## 2023-08-26 DIAGNOSIS — R2689 Other abnormalities of gait and mobility: Secondary | ICD-10-CM

## 2023-08-26 DIAGNOSIS — M25562 Pain in left knee: Secondary | ICD-10-CM | POA: Diagnosis not present

## 2023-08-26 DIAGNOSIS — M5459 Other low back pain: Secondary | ICD-10-CM | POA: Diagnosis not present

## 2023-08-26 NOTE — Therapy (Signed)
 OUTPATIENT PHYSICAL THERAPY LOWER EXTREMITY TREATMENT   Patient Name: Olivia Ewing MRN: [id number] DOB:08/10/1950, 73 y.o., female Today's Date: 08/26/2023  END OF SESSION:  PT End of Session - 08/26/23 1600     Visit Number 6    Number of Visits 16    Date for PT Re-Evaluation 09/20/23    Authorization Type [facility]    PT Start Time 1600    PT Stop Time 1640    PT Time Calculation (min) 40 min    Activity Tolerance Patient tolerated treatment well    Behavior During Therapy WFL for tasks assessed/performed          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   Steroid injections: 09/2013 12/2013    Lumbar radiculopathy, chronic    Nontraumatic incomplete tear of right rotator cuff 07/27/2019   Primary osteoarthritis of right hip 04/27/2018   Right knee meniscal tear 03/14/2011   Right knee pain    posterior horn medial meniscal tear MRI 2013   Septic arthritis (HCC) 02/25/2020   Spinal stenosis    getting epidural injections -last one 06/12/2015   Status post lumbar laminectomy 03/16/2019   Status post total hip replacement, 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   Past Surgical History:  Procedure Laterality Date   APPENDECTOMY     Bladder tack  10/13/2006   cystocoele   BREAST BIOPSY Right 1974   BREAST SURGERY Right    benign cyst   CARPAL TUNNEL RELEASE Left 10/25/2019   Procedure: LEFT CARPAL TUNNEL RELEASE;  Surgeon: Lucilla Lynwood BRAVO, MD;  Location: [facility];   Service: Orthopedics;  Laterality: Left;   COLONOSCOPY     IRRIGATION AND DEBRIDEMENT SHOULDER Right 02/21/2020   Procedure: IRRIGATION AND DEBRIDEMENT RIGHT SHOULDER;  Surgeon: Jerri Kay HERO, MD;  Location: [facility];  Service: Orthopedics;  Laterality: Right;   LUMBAR LAMINECTOMY N/A 03/16/2019   Procedure: CENTRAL LAMINECTOMIES L2-3, L3-4 AND L4-5;  Surgeon: Lucilla Lynwood BRAVO, MD;  Location: [facility];  Service: Orthopedics;  Laterality: N/A;   ROTATOR CUFF REPAIR     TOTAL HIP ARTHROPLASTY Right 04/27/2018   Procedure: RIGHT TOTAL HIP ARTHROPLASTY ANTERIOR APPROACH;  Surgeon: Jerri Kay HERO, MD;  Location: [facility];  Service: Orthopedics;  Laterality: Right;   TUBAL LIGATION     VAGINAL HYSTERECTOMY  2008   Patient Active Problem List   Diagnosis Date Noted   Neoplasm of uncertain behavior 06/30/2023   Vaginal discharge 06/30/2023   Prediabetes 06/13/2023   Osteopenia 03/17/2023   H/O Whipple procedure 12/19/2022   Polyp of duodenum 04/03/2022   Primary osteoarthritis of left knee 11/29/2021   Trigger finger, right ring finger 04/10/2021   Trigger finger, left ring finger 02/26/2021   Asthma 12/13/2020   GERD (gastroesophageal reflux disease) 12/13/2020   Housing instability 12/13/2020   Atypical squamous cells cannot exclude high grade squamous intraepithelial  lesion on cytologic smear of cervix (ASC-H) 09/01/2020   Everitt Quervain's disease (tenosynovitis) 08/22/2020   Insomnia 06/08/2020   No-show for appointment 02/25/2020   S/P arthroscopy of right shoulder 01/05/2020   Carpal tunnel syndrome, left upper limb 10/25/2019    Class: Chronic   Carpal tunnel syndrome, right upper limb 10/25/2019    Class: Chronic   Tendinopathy of right biceps tendon 07/27/2019   Breast cancer screening by mammogram 06/07/2019   Seasonal allergic rhinitis due to pollen 06/07/2019   Colon cancer screening 06/07/2019   Lumbar stenosis with neurogenic claudication 03/16/2019    Class:  Chronic   Acute stress reaction 09/25/2018   Vitamin D  insufficiency 11/25/2013   Healthcare maintenance 11/25/2013   Depression 09/23/2011   Functional incontinence 10/18/2010   Pain in right leg 10/18/2010   Iron deficiency anemia 05/12/2008   Hyperlipemia 06/18/2007   Essential hypertension 05/20/2007    PCP: Izetta Masters DO  REFERRING PROVIDER: Renita Redell DASEN, MD   REFERRING DIAG: Primary osteoarthritis of left knee   THERAPY DIAG:  Chronic pain of left knee  Muscle weakness (generalized)  Other abnormalities of gait and mobility  Rationale for Evaluation and Treatment: Rehabilitation  ONSET DATE: 4-5 years ago  SUBJECTIVE:   SUBJECTIVE STATEMENT: Pt reports continues increase in left knee pain today 10/10. Considered not coming to therapy.  Called and made appt for injection tomorrow  Pt does not have pool access at this time  From initial evaluation:  Whipple procedure last August.  Needed to learn how to walk again.  R thr x 4 years ago, have spinal stenosis x 5 years with surgery. I wear a back brace everyday to supports my back. I have tingling, numbness and P&N in right hip through outside of foot. Get epidurals every 3 months which helps. I am so tired  of being in pain  PERTINENT HISTORY: Whipple Right THR Bilat Oa knees Lumbar stenosis PAIN:  Are you having pain? Yes: NPRS scale: 8/10 Pain location: left knee, lower back into RLE down to ankle Pain description: ache sharp Aggravating factors: sitting too long, standing 20 mins Relieving factors: heat, elevation     PRECAUTIONS: None  RED FLAGS: None   WEIGHT BEARING RESTRICTIONS: No  FALLS:  Has patient fallen in last 6 months? No  LIVING ENVIRONMENT: Lives with: lives with their daughter Lives in: House/apartment Stairs: Yes: Internal: 16 steps; on right going up Has following equipment at home: Single point cane  OCCUPATION: retired  PLOF: Independent  PATIENT GOALS: manage  pain. Strengthening general  NEXT MD VISIT: as needed  OBJECTIVE:  Note: Objective measures were completed at Evaluation unless otherwise noted.  DIAGNOSTIC FINDINGS: advanced tricompartmental osteoarthritis Left  PATIENT SURVEYS:  LEFS:31/80  COGNITION: Overall cognitive status: Within functional limits for tasks assessed     SENSATION: Numbness tingling; and P&N Right le  POSTURE: decreased lumbar lordosis, flexed trunk , and weight shift right  PALPATION: Moderate TTP left med knee joint line  LOWER EXTREMITY ROM:  HD (Lbs) Right eval Left eval  Hip flexion 28.0 13.1  Hip extension    Hip abduction 26.0 16.5  Hip adduction    Hip internal rotation    Hip external rotation    Knee flexion    Knee extension 26.6 14.8  Ankle dorsiflexion    Ankle plantarflexion    Ankle inversion    Ankle eversion     (Blank rows = not tested)  LOWER EXTREMITY MMT:  AROM Right  eval Left eval  Hip flexion    Hip extension    Hip abduction    Hip adduction    Hip internal rotation    Hip external rotation    Knee flexion  72  Knee extension  -20  Ankle dorsiflexion    Ankle plantarflexion    Ankle inversion    Ankle eversion     (Blank rows = not tested)  FUNCTIONAL TESTS:  Tug and Berg balance test to be completed as tolerated  GAIT: Distance walked: 400 ft Assistive device utilized: Single point cane Level of assistance: Complete Independence Comments: Antalgic lle off loading right, increased rue arm swing for momentum to advance lle hiking hip avoiding left knee flex during swing.                                                                                                                                TREATMENT  [facility] Adult PT Treatment:                                             08/26/23 Pt seen for aquatic therapy today.  Treatment took place in water 3.5-4.75 ft in depth at the [facility] pool. Temp of water was 91.  Pt entered/exited the  pool via stairs independently in step-to and step-through pattern with bilat rail.   - UE on barbel walking forward/back and side stepping across pool multiple widths - Standing ue support barbell: marching; relaxed squat; heel raises x 10; toe raises x10; HS curls x 10.  *squatted rest period  - Standing ue support barbell: leg swings  *seated rest period -Seated rest on lift->hip add/abd; LAQ; cycling - return to walking forward/backward with barbell 4.25ft  Pt requires the buoyancy and hydrostatic pressure of water for support, and to offload joints by unweighting joint load by at least 50 % in navel deep water and by at least 75-80% in chest to neck deep water.  Viscosity of the water is needed for resistance of strengthening. Water current perturbations provides challenge to standing balance requiring increased core activation.      PATIENT EDUCATION:  Education details: intro to aquatic therapy  Person educated: Patient Education method: Explanation Education comprehension: verbalized understanding  HOME EXERCISE PROGRAM: TBA Pt does not have pool access at this time  ASSESSMENT:  CLINICAL IMPRESSION: Continued complaints of increased left knee pain today (10/10) with edema and warmth to touch. She reports pain inhibited her sleep last night. She has used ice and continues to use her gabapentin . Has appt tomorrow for a genicular nerve block. She has requested Thursday visit after injection to be cancelled. Pts pain reduces throughout session with left knee range improved.  Popping in knee she experiences at initiation of treatment ceases by end.  Pain reduction by 3-4 NPRS  From initial evaluation:  Patient is a 73 y.o. f who was seen today for physical therapy evaluation and treatment for oa left knee.  Pt has vast pMhx including whipple procedure (1 year ago), lumbar surgery, carpel tunnel surgery,and right THR that has left her with LE and core weakness.  She presents  using cane stating that she keeps moving despite all of her pain because she will not give in.  She has moderate constant pain in LB through right hip with radicular pattern into right foot and left knee pain. She required several position changes throughout exam (sitting, standing and walking) to tolerate.  Not all functional testing completed but will plan on completion as able.  She is a good candidate for aquatic physical therapy intervention and will benefit from the properties of water to initially manage pain and then allow for toleration to strengthening, stretching of core and LE and balance retraining to improve overall safety with adl's and QOL.  OBJECTIVE IMPAIRMENTS: Abnormal gait, decreased activity tolerance, decreased balance, decreased endurance, decreased knowledge of use of DME, decreased mobility, difficulty walking, decreased ROM, decreased strength, impaired flexibility, postural dysfunction, and pain.   ACTIVITY LIMITATIONS: carrying, lifting, bending, sitting, standing, squatting, sleeping, stairs, transfers, and locomotion level  PARTICIPATION LIMITATIONS: meal prep, cleaning, laundry, shopping, community activity, occupation, and yard work  PERSONAL FACTORS: Past/current experiences, Time since onset of injury/illness/exacerbation, and 3+ comorbidities: see pmHx are also affecting patient's functional outcome.   REHAB POTENTIAL: Good  CLINICAL DECISION MAKING: Unstable/unpredictable  EVALUATION COMPLEXITY: High   GOALS: Goals reviewed with patient? Yes  SHORT TERM GOALS: Target date: 08/20/23 Pt will tolerate full aquatic sessions consistently without increase in pain and with improving function to demonstrate good toleration and effectiveness of intervention.  Baseline: Goal status: Met 08/18/23  2.  Pt will improve left knee flex to 90d and extension to -10 Baseline:  Goal status: In progress 08/20/23  3.  Pt will tolerate walking to and from setting and engaging in  aquatic therapy session without excessive fatigue or increase in pain to demonstrate improved toleration to activity Baseline:  Goal status: Met 08/20/23  4.  TUG and Berg balance  functional testing to be complete/tolerated. Baseline: unable to tolerate Goal status: INITIAL  5.  Pt will tolerate stair climbing using alternating or step to pattern ascending and descending 7 steps with use of handrail Baseline:  Goal status: Met 08/18/23   LONG TERM GOALS: Target date: 09/20/23  Pt to improve on LEFS by at least 9 point to demonstrate statistically significant Improvement in function. Baseline:  Goal status: INITIAL  2.  IN ERROR Baseline:  Goal status: INITIAL  3.  Pt will report decrease in pain by at least 50% for improved toleration to activity/quality of life and to demonstrate improved management of pain. Baseline: see chart Goal status: INITIAL  4.  Pt will improve strength in LLE by at least 10 lbs to demonstrate improved overall physical function Baseline: see chart Goal status: INITIAL  5.  Pt will improve on Berg balance test to >/= 45/56 to demonstrate a low fall risk. Baseline: TBT Goal status: INITIAL     PLAN:  PT FREQUENCY: 2x/week  PT DURATION: 8 weeks  PLANNED INTERVENTIONS: 97164- PT Re-evaluation, 97750- Physical Performance Testing, 97110-Therapeutic exercises, 97530- Therapeutic activity, [id number]- Neuromuscular re-education, 97535- Self Care, [id number]- Manual therapy, [id number]- Gait training, [phone]- Orthotic Initial, [phone]- Aquatic Therapy, [phone]- Electrical stimulation (unattended), [phone]- Electrical stimulation (manual), [id number] (1-2 muscles), [id number] (3+ muscles)-  Dry Needling, Patient/Family education, Balance training, Stair training, Taping, Joint mobilization, DME instructions, Cryotherapy, and Moist heat  PLAN FOR NEXT SESSION: Aquatic for stretching and strengthening of core and le; balance retraining, stair climbing, gait and posture.   Ronal Woodlynne)  Alisen Marsiglia MPT 08/26/23 4:52 PM [facility] [address] [place], [place], [address] Phone: [phone]   Fax:  [phone]     Date of referral: 06/06/23 Referring provider: Renita Redell DASEN, MD  Referring diagnosis? Primary osteoarthritis of left knee  Treatment diagnosis? (if different than referring diagnosis) no but added LBP  What was this (referring dx) caused by? Arthritis  Nature of Condition: Chronic (continuous duration > 3 months)   Laterality: Lt  Current Functional Measure Score: LEFS 31/80  Objective measurements identify impairments when they are compared to normal values, the uninvolved extremity, and prior level of function.  [x]  Yes  []  No  Objective assessment of functional ability: Moderate functional limitations   Briefly describe symptoms: Chronic left knee pain due to OA with limited ROM and strength  How did symptoms start: chronic  Average pain intensity:  Last 24 hours: 10/10  Past week: 10/10  How often does the pt experience symptoms? Constantly  How much have the symptoms interfered with usual daily activities? Quite a bit  How has condition changed since care began at this facility? NA - initial visit  In general, how is the patients overall health? Fair   BACK PAIN (STarT Back Screening Tool) Has pain spread down the leg(s) at some time in the last 2 weeks? yes Has there been pain in the shoulder or neck at some time in the last 2 weeks? no Has the pt only walked short distances because of back pain? yes Has patient dressed more slowly because of back pain in the past 2 weeks? yes Does patient think it's not safe for a person with this condition to be physically active? no Does patient have worrying thoughts a lot of the time? no Does patient feel back pain is terrible and will never get any better? yes Has patient stopped enjoying things they usually enjoy? yes

## 2023-08-27 ENCOUNTER — Ambulatory Visit: Admitting: Family Medicine

## 2023-08-28 ENCOUNTER — Ambulatory Visit (HOSPITAL_BASED_OUTPATIENT_CLINIC_OR_DEPARTMENT_OTHER): Admitting: Physical Therapy

## 2023-08-29 ENCOUNTER — Ambulatory Visit: Admitting: Family Medicine

## 2023-09-02 ENCOUNTER — Ambulatory Visit (HOSPITAL_BASED_OUTPATIENT_CLINIC_OR_DEPARTMENT_OTHER): Admitting: Physical Therapy

## 2023-09-02 ENCOUNTER — Encounter (HOSPITAL_BASED_OUTPATIENT_CLINIC_OR_DEPARTMENT_OTHER): Payer: Self-pay | Admitting: Physical Therapy

## 2023-09-02 DIAGNOSIS — R2689 Other abnormalities of gait and mobility: Secondary | ICD-10-CM

## 2023-09-02 DIAGNOSIS — G8929 Other chronic pain: Secondary | ICD-10-CM | POA: Diagnosis not present

## 2023-09-02 DIAGNOSIS — M6281 Muscle weakness (generalized): Secondary | ICD-10-CM | POA: Diagnosis not present

## 2023-09-02 DIAGNOSIS — M25562 Pain in left knee: Secondary | ICD-10-CM | POA: Diagnosis not present

## 2023-09-02 DIAGNOSIS — M5459 Other low back pain: Secondary | ICD-10-CM | POA: Diagnosis not present

## 2023-09-02 NOTE — Therapy (Signed)
 OUTPATIENT PHYSICAL THERAPY LOWER EXTREMITY TREATMENT   Patient Name: Olivia Ewing MRN: [id number] DOB:1950/05/17, 73 y.o., female Today's Date: 09/02/2023  END OF SESSION:  PT End of Session - 09/02/23 1539     Visit Number 7    Number of Visits 16    Date for PT Re-Evaluation 09/20/23    Authorization Type [facility]    PT Start Time 1533    PT Stop Time 1611    PT Time Calculation (min) 38 min    Behavior During Therapy WFL for tasks assessed/performed          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   Steroid injections: 09/2013 12/2013    Lumbar radiculopathy, chronic    Nontraumatic incomplete tear of right rotator cuff 07/27/2019   Primary osteoarthritis of right hip 04/27/2018   Right knee meniscal tear 03/14/2011   Right knee pain    posterior horn medial meniscal tear MRI 2013   Septic arthritis (HCC) 02/25/2020   Spinal stenosis    getting epidural injections -last one 06/12/2015   Status post lumbar laminectomy 03/16/2019   Status post total hip replacement, 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   Past Surgical History:  Procedure Laterality Date   APPENDECTOMY     Bladder tack  10/13/2006   cystocoele   BREAST BIOPSY Right 1974   BREAST SURGERY Right    benign cyst   CARPAL TUNNEL RELEASE Left 10/25/2019   Procedure: LEFT CARPAL TUNNEL RELEASE;  Surgeon: Lucilla Lynwood BRAVO, MD;  Location: [facility];  Service: Orthopedics;  Laterality: Left;   COLONOSCOPY      IRRIGATION AND DEBRIDEMENT SHOULDER Right 02/21/2020   Procedure: IRRIGATION AND DEBRIDEMENT RIGHT SHOULDER;  Surgeon: Jerri Kay HERO, MD;  Location: [facility];  Service: Orthopedics;  Laterality: Right;   LUMBAR LAMINECTOMY N/A 03/16/2019   Procedure: CENTRAL LAMINECTOMIES L2-3, L3-4 AND L4-5;  Surgeon: Lucilla Lynwood BRAVO, MD;  Location: [facility];  Service: Orthopedics;  Laterality: N/A;   ROTATOR CUFF REPAIR     TOTAL HIP ARTHROPLASTY Right 04/27/2018   Procedure: RIGHT TOTAL HIP ARTHROPLASTY ANTERIOR APPROACH;  Surgeon: Jerri Kay HERO, MD;  Location: [facility];  Service: Orthopedics;  Laterality: Right;   TUBAL LIGATION     VAGINAL HYSTERECTOMY  2008   Patient Active Problem List   Diagnosis Date Noted   Neoplasm of uncertain behavior 06/30/2023   Vaginal discharge 06/30/2023   Prediabetes 06/13/2023   Osteopenia 03/17/2023   H/O Whipple procedure 12/19/2022   Polyp of duodenum 04/03/2022   Primary osteoarthritis of left knee 11/29/2021   Trigger finger, right ring finger 04/10/2021   Trigger finger, left ring finger 02/26/2021   Asthma 12/13/2020   GERD (gastroesophageal reflux disease) 12/13/2020   Housing instability 12/13/2020   Atypical squamous cells cannot exclude high grade squamous intraepithelial lesion on cytologic smear of cervix (ASC-H) 09/01/2020  De Quervain's disease (tenosynovitis) 08/22/2020   Insomnia 06/08/2020   No-show for appointment 02/25/2020   S/P arthroscopy of right shoulder 01/05/2020   Carpal tunnel syndrome, left upper limb 10/25/2019    Class: Chronic   Carpal tunnel syndrome, right upper limb 10/25/2019    Class: Chronic   Tendinopathy of right biceps tendon 07/27/2019   Breast cancer screening by mammogram 06/07/2019   Seasonal allergic rhinitis due to pollen 06/07/2019   Colon cancer screening 06/07/2019   Lumbar stenosis with neurogenic claudication 03/16/2019    Class: Chronic   Acute stress reaction 09/25/2018   Vitamin D   insufficiency 11/25/2013   Healthcare maintenance 11/25/2013   Depression 09/23/2011   Functional incontinence 10/18/2010   Pain in right leg 10/18/2010   Iron deficiency anemia 05/12/2008   Hyperlipemia 06/18/2007   Essential hypertension 05/20/2007    PCP: Izetta Masters DO  REFERRING PROVIDER: Renita Redell DASEN, MD   REFERRING DIAG: Primary osteoarthritis of left knee   THERAPY DIAG:  Chronic pain of left knee  Muscle weakness (generalized)  Other abnormalities of gait and mobility  Other low back pain  Rationale for Evaluation and Treatment: Rehabilitation  ONSET DATE: 4-5 years ago  SUBJECTIVE:   SUBJECTIVE STATEMENT: Pt reports she is getting injection in knee on Thursday.    Pt does not have pool access at this time; may consider joining [employer] with silver sneakers  From initial evaluation:  Whipple procedure last August.  Needed to learn how to walk again.  R thr x 4 years ago, have spinal stenosis x 5 years with surgery. I wear a back brace everyday to supports my back. I have tingling, numbness and P&N in right hip through outside of foot. Get epidurals every 3 months which helps. I am so tired  of being in pain  PERTINENT HISTORY: Whipple Right THR Bilat Oa knees Lumbar stenosis PAIN:  Are you having pain? Yes: NPRS scale: 5/10 lower back; 7/10 Lt knee  Pain location: see above Pain description: ache sharp Aggravating factors: sitting too long, standing 20 mins Relieving factors: heat, elevation     PRECAUTIONS: None  RED FLAGS: None   WEIGHT BEARING RESTRICTIONS: No  FALLS:  Has patient fallen in last 6 months? No  LIVING ENVIRONMENT: Lives with: lives with their daughter Lives in: House/apartment Stairs: Yes: Internal: 16 steps; on right going up Has following equipment at home: Single point cane  OCCUPATION: retired  PLOF: Independent  PATIENT GOALS: manage pain. Strengthening general  NEXT MD VISIT: as needed  OBJECTIVE:  Note:  Objective measures were completed at Evaluation unless otherwise noted.  DIAGNOSTIC FINDINGS: advanced tricompartmental osteoarthritis Left  PATIENT SURVEYS:  LEFS:31/80  COGNITION: Overall cognitive status: Within functional limits for tasks assessed     SENSATION: Numbness tingling; and P&N Right le  POSTURE: decreased lumbar lordosis, flexed trunk , and weight shift right  PALPATION: Moderate TTP left med knee joint line  LOWER EXTREMITY ROM:  HD (Lbs) Right eval Left eval  Hip flexion 28.0 13.1  Hip extension    Hip abduction 26.0 16.5  Hip adduction    Hip internal rotation    Hip external rotation    Knee flexion    Knee extension 26.6 14.8  Ankle dorsiflexion    Ankle plantarflexion    Ankle inversion    Ankle eversion     (Blank rows = not tested)  LOWER EXTREMITY MMT:  AROM Right eval Left eval  Hip flexion    Hip  extension    Hip abduction    Hip adduction    Hip internal rotation    Hip external rotation    Knee flexion  72  Knee extension  -20  Ankle dorsiflexion    Ankle plantarflexion    Ankle inversion    Ankle eversion     (Blank rows = not tested)  FUNCTIONAL TESTS:  Tug and Berg balance test to be completed as tolerated  GAIT: Distance walked: 400 ft Assistive device utilized: Single point cane Level of assistance: Complete Independence Comments: Antalgic lle off loading right, increased rue arm swing for momentum to advance lle hiking hip avoiding left knee flex during swing.                                                                                                                                TREATMENT  [facility] Adult PT Treatment:                                             09/02/23 Pt seen for aquatic therapy today.  Treatment took place in water 3.5-4.75 ft in depth at the [facility] pool. Temp of water was 91.  Pt entered/exited the pool via stairs independently in step-to and step-through pattern with bilat  rail.   - UE on yellow hand floats walking forward/backward  - side stepping with arm add/abdct with rainbow hand floats  - suitcase carry with bil rainbow hand floats under water at sides walking forward/ backward - UE on wall heel raises x 10; hip abdct/add 3 x 5; hamstring curls 2x5  - marching with row motion with arms  - UE on wall:  leg swings into hip flexion/extension x 10 each - TrA set with long hollow noodle pull down to thighs x 10 -R/L hamstring stretch with foot on 3rd step x 15s each    Pt requires the buoyancy and hydrostatic pressure of water for support, and to offload joints by unweighting joint load by at least 50 % in navel deep water and by at least 75-80% in chest to neck deep water.  Viscosity of the water is needed for resistance of strengthening. Water current perturbations provides challenge to standing balance requiring increased core activation.      PATIENT EDUCATION:  Education details: intro to aquatic therapy  Person educated: Patient Education method: Explanation Education comprehension: verbalized understanding  HOME EXERCISE PROGRAM: TBA Pt does not have pool access at this time  ASSESSMENT:  CLINICAL IMPRESSION: Pt reports some popping in Lt knee with walking in pool. Some pain in Lt knee when lifting knee too high with marching. Pain in back reduced to 0 and knee to 4/10. Pt to check out [employer] and re-enroll via silver sneakers prior to next appt. Pt is progressing gradually towards goals. Plan to  measure knee ROM and stg next visit.      From initial evaluation:  Patient is a 73 y.o. f who was seen today for physical therapy evaluation and treatment for oa left knee.  Pt has vast pMhx including whipple procedure (1 year ago), lumbar surgery, carpel tunnel surgery,and right THR that has left her with LE and core weakness.  She presents using cane stating that she keeps moving despite all of her pain because she will not give in.  She has  moderate constant pain in LB through right hip with radicular pattern into right foot and left knee pain. She required several position changes throughout exam (sitting, standing and walking) to tolerate.  Not all functional testing completed but will plan on completion as able.  She is a good candidate for aquatic physical therapy intervention and will benefit from the properties of water to initially manage pain and then allow for toleration to strengthening, stretching of core and LE and balance retraining to improve overall safety with adl's and QOL.  OBJECTIVE IMPAIRMENTS: Abnormal gait, decreased activity tolerance, decreased balance, decreased endurance, decreased knowledge of use of DME, decreased mobility, difficulty walking, decreased ROM, decreased strength, impaired flexibility, postural dysfunction, and pain.   ACTIVITY LIMITATIONS: carrying, lifting, bending, sitting, standing, squatting, sleeping, stairs, transfers, and locomotion level  PARTICIPATION LIMITATIONS: meal prep, cleaning, laundry, shopping, community activity, occupation, and yard work  PERSONAL FACTORS: Past/current experiences, Time since onset of injury/illness/exacerbation, and 3+ comorbidities: see pmHx are also affecting patient's functional outcome.   REHAB POTENTIAL: Good  CLINICAL DECISION MAKING: Unstable/unpredictable  EVALUATION COMPLEXITY: High   GOALS: Goals reviewed with patient? Yes  SHORT TERM GOALS: Target date: 08/20/23 Pt will tolerate full aquatic sessions consistently without increase in pain and with improving function to demonstrate good toleration and effectiveness of intervention.  Baseline: Goal status: Met 08/18/23  2.  Pt will improve left knee flex to 90d and extension to -10 Baseline:  Goal status: In progress 08/20/23  3.  Pt will tolerate walking to and from setting and engaging in aquatic therapy session without excessive fatigue or increase in pain to demonstrate improved  toleration to activity Baseline:  Goal status: Met 08/20/23  4.  TUG and Berg balance  functional testing to be complete/tolerated. Baseline: unable to tolerate Goal status: INITIAL  5.  Pt will tolerate stair climbing using alternating or step to pattern ascending and descending 7 steps with use of handrail Baseline:  Goal status: Met 08/18/23   LONG TERM GOALS: Target date: 09/20/23  Pt to improve on LEFS by at least 9 point to demonstrate statistically significant Improvement in function. Baseline:  Goal status: INITIAL  2.  IN ERROR Baseline:  Goal status: INITIAL  3.  Pt will report decrease in pain by at least 50% for improved toleration to activity/quality of life and to demonstrate improved management of pain. Baseline: see chart Goal status: INITIAL  4.  Pt will improve strength in LLE by at least 10 lbs to demonstrate improved overall physical function Baseline: see chart Goal status: INITIAL  5.  Pt will improve on Berg balance test to >/= 45/56 to demonstrate a low fall risk. Baseline: TBT Goal status: INITIAL     PLAN:  PT FREQUENCY: 2x/week  PT DURATION: 8 weeks  PLANNED INTERVENTIONS: 97164- PT Re-evaluation, 97750- Physical Performance Testing, 97110-Therapeutic exercises, 97530- Therapeutic activity, [id number]- Neuromuscular re-education, 97535- Self Care, [id number]- Manual therapy, [id number]- Gait training, [phone]- Orthotic Initial, [phone]- Aquatic Therapy, [phone]- Electrical  stimulation (unattended), [phone]- Electrical stimulation (manual), 79439 (1-2 muscles), [id number] (3+ muscles)- Dry Needling, Patient/Family education, Balance training, Stair training, Taping, Joint mobilization, DME instructions, Cryotherapy, and Moist heat  PLAN FOR NEXT SESSION: Aquatic for stretching and strengthening of core and le; balance retraining, stair climbing, gait and posture.   Delon Aquas, PTA 09/02/23 4:21 PM [facility] [address] [place], [place], [address] Phone: [phone]   Fax:  [phone]    Date of referral: 06/06/23 Referring provider: Renita Redell DASEN, MD  Referring diagnosis? Primary osteoarthritis of left knee  Treatment diagnosis? (if different than referring diagnosis) no but added LBP  What was this (referring dx) caused by? Arthritis  Nature of Condition: Chronic (continuous duration > 3 months)   Laterality: Lt  Current Functional Measure Score: LEFS 31/80  Objective measurements identify impairments when they are compared to normal values, the uninvolved extremity, and prior level of function.  [x]  Yes  []  No  Objective assessment of functional ability: Moderate functional limitations   Briefly describe symptoms: Chronic left knee pain due to OA with limited ROM and strength  How did symptoms start: chronic  Average pain intensity:  Last 24 hours: 10/10  Past week: 10/10  How often does the pt experience symptoms? Constantly  How much have the symptoms interfered with usual daily activities? Quite a bit  How has condition changed since care began at this facility? NA - initial visit  In general, how is the patients overall health? Fair   BACK PAIN (STarT Back Screening Tool) Has pain spread down the leg(s) at some time in the last 2 weeks? yes Has there been pain in the shoulder or neck at some time in the last 2 weeks? no Has the pt only walked short distances because of back pain? yes Has patient dressed more slowly because of back pain in the past 2 weeks? yes Does patient think it's not safe for a person with this condition to be physically active? no Does patient have worrying thoughts a lot of the time? no Does patient feel back pain is terrible and will never get any better? yes Has patient stopped enjoying things they usually enjoy? yes

## 2023-09-04 ENCOUNTER — Encounter: Admitting: Physical Medicine & Rehabilitation

## 2023-09-04 ENCOUNTER — Encounter: Payer: Self-pay | Admitting: Family Medicine

## 2023-09-04 ENCOUNTER — Ambulatory Visit (INDEPENDENT_AMBULATORY_CARE_PROVIDER_SITE_OTHER): Admitting: Family Medicine

## 2023-09-04 ENCOUNTER — Ambulatory Visit (HOSPITAL_BASED_OUTPATIENT_CLINIC_OR_DEPARTMENT_OTHER): Admitting: Physical Therapy

## 2023-09-04 VITALS — BP 165/93 | Ht 65.0 in | Wt 180.0 lb

## 2023-09-04 DIAGNOSIS — G8929 Other chronic pain: Secondary | ICD-10-CM

## 2023-09-04 DIAGNOSIS — M25562 Pain in left knee: Secondary | ICD-10-CM

## 2023-09-04 DIAGNOSIS — M1712 Unilateral primary osteoarthritis, left knee: Secondary | ICD-10-CM | POA: Diagnosis not present

## 2023-09-04 DIAGNOSIS — I10 Essential (primary) hypertension: Secondary | ICD-10-CM

## 2023-09-04 MED ORDER — METHYLPREDNISOLONE ACETATE 40 MG/ML IJ SUSP
40.0000 mg | Freq: Once | INTRAMUSCULAR | Status: AC
  Administered 2023-09-04: 40 mg via INTRA_ARTICULAR

## 2023-09-04 MED ORDER — HYDROCODONE-ACETAMINOPHEN 7.5-325 MG PO TABS: 1.0000 | ORAL_TABLET | Freq: Four times a day (QID) | ORAL | 0 refills | Status: AC | PRN

## 2023-09-04 NOTE — Patient Instructions (Signed)

## 2023-09-04 NOTE — Progress Notes (Addendum)
 PCP: Waymond Cart, MD  Subjective:  CC: Lt knee pain  HPI: Patient is a 73 y.o. female here for follow-up on chronic left knee pain.    Patient has been diagnosed in the past with advanced tricompartmental osteoarthritis of left knee with limited range of motion and pain limited strength.  Previously saw sports med fellow at our clinic on 4/11 and had knee aspiration with Toradol  injection.  Prior to this that she has failed many conservative treatments including Tylenol  #3, gabapentin , OTC analgesics, heat and ice.  She did have a intra-articular glucocorticoid injection around February 2025 prior to receiving the Toradol  injection with us  and says it also provided minimal relief.  Since the last visit with our sports med fellow she also saw Dr. Renita who started her on topical capsaicin  3-4 times daily, referred her to PT, and discussed geniculate nerve block.  Today she states that since her last appointment with Dr, Renita her symptoms have gradually worsened.  Says last steroid injection seem to help for almost 43-month, Toradol  injection helped for roughly 1 week, and she has not been consistent with capsaicin  use.  Still performing PT and this seems to help with her knee pain.  Has severe pain with ambulation and weightbearing.  Getting progressively worse.  Currently ran out of her Norco prescription and over-the-counter medication such as ibuprofen  Tylenol  do not touch her pain.  Plans on moving to a new apartment in 2 weeks and requesting cortisone injection to last her up to that period of time.  Plans to get geniculate nerve ablation in 1 to 2 months after she is settled from her recent move. 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   Bipolar disorder (HCC)    Cervicalgia    Depression    Environmental allergies    cause SOB, uses inhaler for   Environmental  and seasonal allergies    uses inhaler prn   Full dentures    GERD (gastroesophageal reflux disease)    diet controlled - no meds   Hyperlipidemia    diet controlled, no meds   Hypertension    Left knee DJD, degenerative meniscus tear 04/06/2012   Steroid injections: 09/2013 12/2013    Lumbar radiculopathy, chronic    Nontraumatic incomplete tear of right rotator cuff 07/27/2019   Primary osteoarthritis of right hip 04/27/2018   Right knee meniscal tear 03/14/2011   Right knee pain    posterior horn medial meniscal tear MRI 2013   Septic arthritis (HCC) 02/25/2020   Spinal stenosis    getting epidural injections -last one 06/12/2015   Status post lumbar laminectomy 03/16/2019   Status post total hip replacement, 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    Current Outpatient Medications on File Prior to Visit  Medication Sig Dispense Refill   albuterol  (VENTOLIN  HFA) 108 (90 Base) MCG/ACT inhaler INHALE 1 TO 2 PUFFS INTO THE LUNGS EVERY 6 HOURS AS NEEDED FOR SHORTNESS OF BREATH 8.5 g 3   amLODipine  (NORVASC ) 5 MG tablet Take 1 tablet (5 mg total) by mouth daily. 90 tablet 3   capsaicin  (ZOSTRIX) 0.025 % cream Apply topically in the morning, at noon, in the evening, and at bedtime. Apply small amount to knee 3-4 times per day. 60 g 2   cholecalciferol  (VITAMIN D3) 25 MCG (1000 UNIT) tablet Take 1 tablet (1,000 Units total) by mouth daily. 30  tablet 3   clotrimazole  (CLOTRIMAZOLE  3) 2 % vaginal cream Apply one applicator full into vagina daily for three days at bedtime 21 g 0   COD LIVER OIL PO Take by mouth.     ferrous gluconate  (FERGON) 324 MG tablet Take 1 tablet (324 mg total) by mouth daily with breakfast. 60 tablet 1   FLUoxetine  (PROZAC ) 20 MG capsule Take 1 capsule (20 mg total) by mouth daily. 30 capsule 2   fluticasone  (FLONASE ) 50 MCG/ACT nasal spray USE 1 OR 2 SPRAYS IN EACH NOSTRIL DAILY 16 g 3   fluticasone  (FLOVENT  HFA) 44 MCG/ACT inhaler Inhale 1 puff into the lungs 2 (two) times daily. 1 each 2    gabapentin  (NEURONTIN ) 600 MG tablet Take 1 tablet (600 mg total) by mouth 3 (three) times daily. 90 tablet 11   lisinopril -hydrochlorothiazide  (ZESTORETIC ) 20-12.5 MG tablet Take 1 tablet by mouth daily. 90 tablet 3   loratadine  (CLARITIN ) 10 MG tablet Take 1 tablet (10 mg total) by mouth daily. 30 tablet 0   Multiple Vitamin (MULTIVITAMIN WITH MINERALS) TABS tablet Take 1 tablet by mouth daily.     QUEtiapine  (SEROQUEL ) 100 MG tablet Take 1 tablet (100 mg total) by mouth at bedtime. 90 tablet 3   rosuvastatin  (CRESTOR ) 20 MG tablet Take 1 tablet (20 mg total) by mouth daily. 90 tablet 3   sodium chloride  (OCEAN) 0.65 % SOLN nasal spray Place 1 spray into both nostrils 2 (two) times daily as needed. 30 mL 0   No current facility-administered medications on file prior to visit.    Past Surgical History:  Procedure Laterality Date   APPENDECTOMY     Bladder tack  10/13/2006   cystocoele   BREAST BIOPSY Right 1974   BREAST SURGERY Right    benign cyst   CARPAL TUNNEL RELEASE Left 10/25/2019   Procedure: LEFT CARPAL TUNNEL RELEASE;  Surgeon: Lucilla Lynwood BRAVO, MD;  Location: [facility];  Service: Orthopedics;  Laterality: Left;   COLONOSCOPY     IRRIGATION AND DEBRIDEMENT SHOULDER Right 02/21/2020   Procedure: IRRIGATION AND DEBRIDEMENT RIGHT SHOULDER;  Surgeon: Jerri Kay HERO, MD;  Location: [facility];  Service: Orthopedics;  Laterality: Right;   LUMBAR LAMINECTOMY N/A 03/16/2019   Procedure: CENTRAL LAMINECTOMIES L2-3, L3-4 AND L4-5;  Surgeon: Lucilla Lynwood BRAVO, MD;  Location: [facility];  Service: Orthopedics;  Laterality: N/A;   ROTATOR CUFF REPAIR     TOTAL HIP ARTHROPLASTY Right 04/27/2018   Procedure: RIGHT TOTAL HIP ARTHROPLASTY ANTERIOR APPROACH;  Surgeon: Jerri Kay HERO, MD;  Location: [facility];  Service: Orthopedics;  Laterality: Right;   TUBAL LIGATION     VAGINAL HYSTERECTOMY  2008    Allergies  Allergen Reactions   Tramadol  Other (See Comments)     Hallucinate, nightmare     BP (!) 165/93   Ht 5' 5 (1.651 m)   Wt 180 lb (81.6 kg)   BMI 29.95 kg/m       No data to display              No data to display              Objective:  Ortho Exam:  In order to keep physical exam documentation concise please assume all testing not explicitly mentioned below was found to be within normal limits.  All pertinent positives from my physical exam are as follows:  Left knee with large effusion, no increased redness or warmth.  Tender palpation  over patella and across both medial lateral joint line.  Range of motion testing limited due to pain.  No evidence of new injuries or trauma.  Audible and palpatory crepitus with patellar compression test. Walking with an antalgic gait Neurovascularly intact distally    Assessment & Plan:  1.  Acute on chronic chronic left knee pain from moderate/severe tricompartment osteoarthritis - Patient has tried numerous interventions in the past for left knee osteoarthritis including cortisone injections, Toradol  injections, prescription medication such as tramadol ,Norco, capsaicin , OTC medications ibuprofen  and Tylenol , heat/ice.  Says she gets majority of relief from steroid injections and sometimes will take majority of pain away for up to 3 months.  Requesting repeat cortisone injection today.  Last one completed in February 2025.    PLAN: - Aspiration and injection of methylprednisone completed as procedure note described below.   - Also instructed patient to continue with PT for left knee pain.  Patient is currently undergoing water aerobics as well as recumbent cycling which both seem to help.   - Also send patient limited supply of Norco 7.5-325 for flares of knee pain.   - Will follow back up in 6-8 weeks for further consideration of geniculate nerve ablation with Dr. Renita if has ongoing pain.    Patient verbalizes understanding and agrees with plan.  Procedure Note: After consent was  obtained, using sterile technique the left knee was prepped and 3 ml's of 1% Lidocaine  without epinepherine used to anesthetize the needle tract into the joint from the superior lateral approach. The knee joint was entered and 25 ml's of yellow (synovial) colored fluid was withdrawn.  Fluid not sent for analysis.  Steroid 40 mg methylprednisolone  and 4 ml Lidocaine  without epinepherine was then injected and the needle withdrawn.  The procedure was well tolerated.  The patient is asked to continue to rest the knee for a few more days before resuming regular activities.  It may be more painful for the first 1-2 days.  Watch for fever, or increased swelling or persistent pain in knee. Call or return to clinic prn if such symptoms occur or the knee fails to improve as anticipated.

## 2023-09-09 ENCOUNTER — Telehealth: Payer: Self-pay | Admitting: Family Medicine

## 2023-09-09 ENCOUNTER — Ambulatory Visit (HOSPITAL_BASED_OUTPATIENT_CLINIC_OR_DEPARTMENT_OTHER): Admitting: Physical Therapy

## 2023-09-09 NOTE — Telephone Encounter (Signed)
-----   Message from [facility] sent at 09/09/2023  9:36 AM EDT ----- Regarding: FW: phone message Would you like me to call her? Anything to offer her? ----- Message ----- From: Lera Andrea CROME Sent: 09/09/2023   8:52 AM EDT To: Duwaine Bolognese, LAT Subject: phone message                                  Pt is asking for a call back to discuss pain in L knee since injection. She can't put any pressure on her leg.

## 2023-09-09 NOTE — Telephone Encounter (Signed)
 Spoke to patient found 09/09/2023 at 12:05 PM EST.  Patient called with concerns about left knee pain since aspiration and cortisone injection with us  on 09/04/2023.  She notes that pain started on 09/06/2023.  Just was experiencing increased pain.  No increased redness, no increased swelling, no increased warmth.  Denies fever/chills/night sweats.  Which is painful to put pressure on the knee.  Taking Norco as needed, makes her sleepy.  Is starting to feel a little bit better, but still having some pain.  I advised that sounds as though she may have experienced a steroid flare.  She can continue her Norco as needed.  Can apply ice as needed.  I expect that it should continue to improve over the next few days.  If pain persist or if symptoms are worsening she should schedule follow-up appointment with us  for reevaluation.  Patient expressed understanding and agreement.  All questions were answered

## 2023-09-11 ENCOUNTER — Encounter (HOSPITAL_BASED_OUTPATIENT_CLINIC_OR_DEPARTMENT_OTHER): Payer: Self-pay | Admitting: Physical Therapy

## 2023-09-11 ENCOUNTER — Ambulatory Visit (HOSPITAL_BASED_OUTPATIENT_CLINIC_OR_DEPARTMENT_OTHER): Admitting: Physical Therapy

## 2023-09-11 DIAGNOSIS — M6281 Muscle weakness (generalized): Secondary | ICD-10-CM

## 2023-09-11 DIAGNOSIS — R2689 Other abnormalities of gait and mobility: Secondary | ICD-10-CM

## 2023-09-11 DIAGNOSIS — G8929 Other chronic pain: Secondary | ICD-10-CM

## 2023-09-11 DIAGNOSIS — M25562 Pain in left knee: Secondary | ICD-10-CM | POA: Diagnosis not present

## 2023-09-11 DIAGNOSIS — M5459 Other low back pain: Secondary | ICD-10-CM | POA: Diagnosis not present

## 2023-09-11 NOTE — Therapy (Signed)
 OUTPATIENT PHYSICAL THERAPY LOWER EXTREMITY TREATMENT   Patient Name: Olivia Ewing MRN: [id number] DOB:1950/10/16, 73 y.o., female Today's Date: 09/11/2023  END OF SESSION:  PT End of Session - 09/11/23 1546     Visit Number 8    Number of Visits 16    Date for PT Re-Evaluation 09/20/23    Authorization Type [facility]    PT Start Time 1534    PT Stop Time 1614    PT Time Calculation (min) 40 min    Activity Tolerance Patient tolerated treatment well    Behavior During Therapy WFL for tasks assessed/performed           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   Steroid injections: 09/2013 12/2013    Lumbar radiculopathy, chronic    Nontraumatic incomplete tear of right rotator cuff 07/27/2019   Primary osteoarthritis of right hip 04/27/2018   Right knee meniscal tear 03/14/2011   Right knee pain    posterior horn medial meniscal tear MRI 2013   Septic arthritis (HCC) 02/25/2020   Spinal stenosis    getting epidural injections -last one 06/12/2015   Status post lumbar laminectomy 03/16/2019   Status post total hip replacement, 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   Past Surgical History:  Procedure Laterality Date   APPENDECTOMY     Bladder tack  10/13/2006   cystocoele   BREAST BIOPSY Right 1974   BREAST SURGERY Right    benign cyst   CARPAL TUNNEL RELEASE Left 10/25/2019   Procedure: LEFT CARPAL TUNNEL RELEASE;  Surgeon: Lucilla Lynwood BRAVO, MD;  Location: [facility];   Service: Orthopedics;  Laterality: Left;   COLONOSCOPY     IRRIGATION AND DEBRIDEMENT SHOULDER Right 02/21/2020   Procedure: IRRIGATION AND DEBRIDEMENT RIGHT SHOULDER;  Surgeon: Jerri Kay HERO, MD;  Location: [facility];  Service: Orthopedics;  Laterality: Right;   LUMBAR LAMINECTOMY N/A 03/16/2019   Procedure: CENTRAL LAMINECTOMIES L2-3, L3-4 AND L4-5;  Surgeon: Lucilla Lynwood BRAVO, MD;  Location: [facility];  Service: Orthopedics;  Laterality: N/A;   ROTATOR CUFF REPAIR     TOTAL HIP ARTHROPLASTY Right 04/27/2018   Procedure: RIGHT TOTAL HIP ARTHROPLASTY ANTERIOR APPROACH;  Surgeon: Jerri Kay HERO, MD;  Location: [facility];  Service: Orthopedics;  Laterality: Right;   TUBAL LIGATION     VAGINAL HYSTERECTOMY  2008   Patient Active Problem List   Diagnosis Date Noted   Neoplasm of uncertain behavior 06/30/2023   Vaginal discharge 06/30/2023   Prediabetes 06/13/2023   Osteopenia 03/17/2023   H/O Whipple procedure 12/19/2022   Polyp of duodenum 04/03/2022   Primary osteoarthritis of left knee 11/29/2021   Trigger finger, right ring finger 04/10/2021   Trigger finger, left ring finger 02/26/2021   Asthma 12/13/2020   GERD (gastroesophageal reflux disease) 12/13/2020   Housing instability 12/13/2020   Atypical squamous cells cannot exclude high grade squamous  intraepithelial lesion on cytologic smear of cervix (ASC-H) 09/01/2020   Everitt Quervain's disease (tenosynovitis) 08/22/2020   Insomnia 06/08/2020   No-show for appointment 02/25/2020   S/P arthroscopy of right shoulder 01/05/2020   Carpal tunnel syndrome, left upper limb 10/25/2019    Class: Chronic   Carpal tunnel syndrome, right upper limb 10/25/2019    Class: Chronic   Tendinopathy of right biceps tendon 07/27/2019   Breast cancer screening by mammogram 06/07/2019   Seasonal allergic rhinitis due to pollen 06/07/2019   Colon cancer screening 06/07/2019   Lumbar stenosis with neurogenic claudication 03/16/2019    Class:  Chronic   Acute stress reaction 09/25/2018   Vitamin D  insufficiency 11/25/2013   Healthcare maintenance 11/25/2013   Depression 09/23/2011   Functional incontinence 10/18/2010   Pain in right leg 10/18/2010   Iron deficiency anemia 05/12/2008   Hyperlipemia 06/18/2007   Essential hypertension 05/20/2007    PCP: Izetta Masters DO  REFERRING PROVIDER: Renita Redell DASEN, MD   REFERRING DIAG: Primary osteoarthritis of left knee   THERAPY DIAG:  Chronic pain of left knee  Muscle weakness (generalized)  Other abnormalities of gait and mobility  Rationale for Evaluation and Treatment: Rehabilitation  ONSET DATE: 4-5 years ago  SUBJECTIVE:   SUBJECTIVE STATEMENT: Injection 1 week ago With pain relief for a few days then all of a sudden I felt a sharp pain and now the pain is high 10/10.  Pt does not have pool access at this time; may consider joining [employer] with silver sneakers  From initial evaluation:  Whipple procedure last August.  Needed to learn how to walk again.  R thr x 4 years ago, have spinal stenosis x 5 years with surgery. I wear a back brace everyday to supports my back. I have tingling, numbness and P&N in right hip through outside of foot. Get epidurals every 3 months which helps. I am so tired  of being in pain  PERTINENT HISTORY: Whipple Right THR Bilat Oa knees Lumbar stenosis PAIN:  Are you having pain? Yes: NPRS scale: 5/10 lower back; 10/10 Lt knee  Pain location: see above Pain description: ache sharp Aggravating factors: sitting too long, standing 20 mins Relieving factors: heat, elevation     PRECAUTIONS: None  RED FLAGS: None   WEIGHT BEARING RESTRICTIONS: No  FALLS:  Has patient fallen in last 6 months? No  LIVING ENVIRONMENT: Lives with: lives with their daughter Lives in: House/apartment Stairs: Yes: Internal: 16 steps; on right going up Has following equipment at home: Single point cane  OCCUPATION: retired  PLOF:  Independent  PATIENT GOALS: manage pain. Strengthening general  NEXT MD VISIT: as needed  OBJECTIVE:  Note: Objective measures were completed at Evaluation unless otherwise noted.  DIAGNOSTIC FINDINGS: advanced tricompartmental osteoarthritis Left  PATIENT SURVEYS:  LEFS:31/80  COGNITION: Overall cognitive status: Within functional limits for tasks assessed     SENSATION: Numbness tingling; and P&N Right le  POSTURE: decreased lumbar lordosis, flexed trunk , and weight shift right  PALPATION: Moderate TTP left med knee joint line  LOWER EXTREMITY ROM:  HD (Lbs) Right eval Left eval  Hip flexion 28.0 13.1  Hip extension    Hip abduction 26.0 16.5  Hip adduction    Hip internal rotation    Hip external rotation    Knee flexion    Knee extension 26.6 14.8  Ankle dorsiflexion    Ankle plantarflexion    Ankle inversion    Ankle eversion     (  Blank rows = not tested)  LOWER EXTREMITY MMT:  AROM Right eval Left eval  Hip flexion    Hip extension    Hip abduction    Hip adduction    Hip internal rotation    Hip external rotation    Knee flexion  72  Knee extension  -20  Ankle dorsiflexion    Ankle plantarflexion    Ankle inversion    Ankle eversion     (Blank rows = not tested)  FUNCTIONAL TESTS:  Tug and Berg balance test to be completed as tolerated  GAIT: Distance walked: 400 ft Assistive device utilized: Single point cane Level of assistance: Complete Independence Comments: Antalgic lle off loading right, increased rue arm swing for momentum to advance lle hiking hip avoiding left knee flex during swing.                                                                                                                                TREATMENT  [facility] Adult PT Treatment:                                             09/11/23 Pt seen for aquatic therapy today.  Treatment took place in water 3.5-4.75 ft in depth at the [facility] pool. Temp of  water was 91.  Pt entered/exited the pool via stairs independently in step-to and step-through pattern with bilat rail.   - UE on yellow hand floats walking forward/backward  - side stepping with arm add/abdct with rainbow hand floats  -seated on lift: LAQ; hip add/abd; cycling - UE on wall heel raises x 10; hip abdct/add 3 x 5; hamstring curls not tolerated; marching  not tolerated; hip flex/ext x 10 - TrA set with long hollow noodle pull down to thighs x 10 -Seated: LAQ; cycling   Pt requires the buoyancy and hydrostatic pressure of water for support, and to offload joints by unweighting joint load by at least 50 % in navel deep water and by at least 75-80% in chest to neck deep water.  Viscosity of the water is needed for resistance of strengthening. Water current perturbations provides challenge to standing balance requiring increased core activation.      PATIENT EDUCATION:  Education details: intro to aquatic therapy  Person educated: Patient Education method: Explanation Education comprehension: verbalized understanding  HOME EXERCISE PROGRAM: TBA Pt does not have pool access at this time  ASSESSMENT:  CLINICAL IMPRESSION: Pt with increased left knee pain in past 2 days after injection and drainage. Had initial pain reduction for a few days before sharp pain.  She arrives today with ace wrap in place.  Focus today is on pain relief through gentle left knee movement and strengthening. She reports left knee popping with forward and backward amb.  Side stepping without discomfort.  She does not tolerate standing exercising.  Unloaded knee flex tolerated well.  She is instructed on pain management using meds prescribed by MD as well as ice, rest and elevation.  She VU.       From initial evaluation:  Patient is a 73 y.o. f who was seen today for physical therapy evaluation and treatment for oa left knee.  Pt has vast pMhx including whipple procedure (1 year ago), lumbar  surgery, carpel tunnel surgery,and right THR that has left her with LE and core weakness.  She presents using cane stating that she keeps moving despite all of her pain because she will not give in.  She has moderate constant pain in LB through right hip with radicular pattern into right foot and left knee pain. She required several position changes throughout exam (sitting, standing and walking) to tolerate.  Not all functional testing completed but will plan on completion as able.  She is a good candidate for aquatic physical therapy intervention and will benefit from the properties of water to initially manage pain and then allow for toleration to strengthening, stretching of core and LE and balance retraining to improve overall safety with adl's and QOL.  OBJECTIVE IMPAIRMENTS: Abnormal gait, decreased activity tolerance, decreased balance, decreased endurance, decreased knowledge of use of DME, decreased mobility, difficulty walking, decreased ROM, decreased strength, impaired flexibility, postural dysfunction, and pain.   ACTIVITY LIMITATIONS: carrying, lifting, bending, sitting, standing, squatting, sleeping, stairs, transfers, and locomotion level  PARTICIPATION LIMITATIONS: meal prep, cleaning, laundry, shopping, community activity, occupation, and yard work  PERSONAL FACTORS: Past/current experiences, Time since onset of injury/illness/exacerbation, and 3+ comorbidities: see pmHx are also affecting patient's functional outcome.   REHAB POTENTIAL: Good  CLINICAL DECISION MAKING: Unstable/unpredictable  EVALUATION COMPLEXITY: High   GOALS: Goals reviewed with patient? Yes  SHORT TERM GOALS: Target date: 08/20/23 Pt will tolerate full aquatic sessions consistently without increase in pain and with improving function to demonstrate good toleration and effectiveness of intervention.  Baseline: Goal status: Met 08/18/23  2.  Pt will improve left knee flex to 90d and extension to  -10 Baseline:  Goal status: In progress 08/20/23  3.  Pt will tolerate walking to and from setting and engaging in aquatic therapy session without excessive fatigue or increase in pain to demonstrate improved toleration to activity Baseline:  Goal status: Met 08/20/23  4.  TUG and Berg balance  functional testing to be complete/tolerated. Baseline: unable to tolerate Goal status: INITIAL  5.  Pt will tolerate stair climbing using alternating or step to pattern ascending and descending 7 steps with use of handrail Baseline:  Goal status: Met 08/18/23   LONG TERM GOALS: Target date: 09/20/23  Pt to improve on LEFS by at least 9 point to demonstrate statistically significant Improvement in function. Baseline:  Goal status: INITIAL  2.  IN ERROR Baseline:  Goal status: INITIAL  3.  Pt will report decrease in pain by at least 50% for improved toleration to activity/quality of life and to demonstrate improved management of pain. Baseline: see chart Goal status: INITIAL  4.  Pt will improve strength in LLE by at least 10 lbs to demonstrate improved overall physical function Baseline: see chart Goal status: INITIAL  5.  Pt will improve on Berg balance test to >/= 45/56 to demonstrate a low fall risk. Baseline: TBT Goal status: INITIAL     PLAN:  PT FREQUENCY: 2x/week  PT DURATION: 8 weeks  PLANNED INTERVENTIONS: 02835- PT Re-evaluation, 97750-  Physical Performance Testing, 97110-Therapeutic exercises, 97530- Therapeutic activity, [id number]- Neuromuscular re-education, [phone]- Self Care, 02859- Manual therapy, [phone]- Gait training, [phone]- Orthotic Initial, [phone]- Aquatic Therapy, [phone]- Electrical stimulation (unattended), [phone]- Electrical stimulation (manual), [phone] (1-2 muscles), [id number] (3+ muscles)- Dry Needling, Patient/Family education, Balance training, Stair training, Taping, Joint mobilization, DME instructions, Cryotherapy, and Moist heat  PLAN FOR NEXT SESSION: Aquatic for  stretching and strengthening of core and le; balance retraining, stair climbing, gait and posture.   [address] [place]) Liliani Bobo MPT 09/11/23 4:25 PM [facility] [address] [place], [place], [address] Phone: [phone]   Fax:  [phone]     Date of referral: 06/06/23 Referring provider: Renita Redell DASEN, MD  Referring diagnosis? Primary osteoarthritis of left knee  Treatment diagnosis? (if different than referring diagnosis) no but added LBP  What was this (referring dx) caused by? Arthritis  Nature of Condition: Chronic (continuous duration > 3 months)   Laterality: Lt  Current Functional Measure Score: LEFS 31/80  Objective measurements identify impairments when they are compared to normal values, the uninvolved extremity, and prior level of function.  [x]  Yes  []  No  Objective assessment of functional ability: Moderate functional limitations   Briefly describe symptoms: Chronic left knee pain due to OA with limited ROM and strength  How did symptoms start: chronic  Average pain intensity:  Last 24 hours: 10/10  Past week: 10/10  How often does the pt experience symptoms? Constantly  How much have the symptoms interfered with usual daily activities? Quite a bit  How has condition changed since care began at this facility? NA - initial visit  In general, how is the patients overall health? Fair   BACK PAIN (STarT Back Screening Tool) Has pain spread down the leg(s) at some time in the last 2 weeks? yes Has there been pain in the shoulder or neck at some time in the last 2 weeks? no Has the pt only walked short distances because of back pain? yes Has patient dressed more slowly because of back pain in the past 2 weeks? yes Does patient think it's not safe for a person with this condition to be physically active? no Does patient have worrying thoughts a lot of the time? no Does patient feel back pain is terrible  and will never get any better? yes Has patient stopped enjoying things they usually enjoy? yes

## 2023-09-16 ENCOUNTER — Ambulatory Visit (HOSPITAL_BASED_OUTPATIENT_CLINIC_OR_DEPARTMENT_OTHER): Admitting: Physical Therapy

## 2023-09-18 ENCOUNTER — Ambulatory Visit (HOSPITAL_BASED_OUTPATIENT_CLINIC_OR_DEPARTMENT_OTHER): Attending: Pain Medicine | Admitting: Physical Therapy

## 2023-09-18 ENCOUNTER — Encounter (HOSPITAL_BASED_OUTPATIENT_CLINIC_OR_DEPARTMENT_OTHER): Payer: Self-pay | Admitting: Physical Therapy

## 2023-09-18 DIAGNOSIS — M6281 Muscle weakness (generalized): Secondary | ICD-10-CM | POA: Insufficient documentation

## 2023-09-18 DIAGNOSIS — M25562 Pain in left knee: Secondary | ICD-10-CM | POA: Diagnosis not present

## 2023-09-18 DIAGNOSIS — M5459 Other low back pain: Secondary | ICD-10-CM | POA: Diagnosis not present

## 2023-09-18 DIAGNOSIS — R2689 Other abnormalities of gait and mobility: Secondary | ICD-10-CM | POA: Diagnosis not present

## 2023-09-18 DIAGNOSIS — G8929 Other chronic pain: Secondary | ICD-10-CM | POA: Insufficient documentation

## 2023-09-18 NOTE — Therapy (Signed)
 OUTPATIENT PHYSICAL THERAPY LOWER EXTREMITY TREATMENT   Patient Name: Olivia Ewing MRN: [id number] DOB:29-May-1950, 73 y.o., female Today's Date: 09/18/2023  END OF SESSION:  PT End of Session - 09/18/23 1541     Visit Number 9    Number of Visits 16    Date for PT Re-Evaluation 09/20/23    Authorization Type [facility]    PT Start Time 1528    PT Stop Time 1608    PT Time Calculation (min) 40 min    Activity Tolerance Patient tolerated treatment well    Behavior During Therapy WFL for tasks assessed/performed           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   Steroid injections: 09/2013 12/2013    Lumbar radiculopathy, chronic    Nontraumatic incomplete tear of right rotator cuff 07/27/2019   Primary osteoarthritis of right hip 04/27/2018   Right knee meniscal tear 03/14/2011   Right knee pain    posterior horn medial meniscal tear MRI 2013   Septic arthritis (HCC) 02/25/2020   Spinal stenosis    getting epidural injections -last one 06/12/2015   Status post lumbar laminectomy 03/16/2019   Status post total hip replacement, 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   Past Surgical History:  Procedure Laterality Date   APPENDECTOMY     Bladder tack  10/13/2006   cystocoele   BREAST BIOPSY Right 1974   BREAST SURGERY Right    benign cyst   CARPAL TUNNEL RELEASE Left 10/25/2019   Procedure: LEFT CARPAL TUNNEL RELEASE;  Surgeon: Lucilla Lynwood BRAVO, MD;  Location: [facility];   Service: Orthopedics;  Laterality: Left;   COLONOSCOPY     IRRIGATION AND DEBRIDEMENT SHOULDER Right 02/21/2020   Procedure: IRRIGATION AND DEBRIDEMENT RIGHT SHOULDER;  Surgeon: Jerri Kay HERO, MD;  Location: [facility];  Service: Orthopedics;  Laterality: Right;   LUMBAR LAMINECTOMY N/A 03/16/2019   Procedure: CENTRAL LAMINECTOMIES L2-3, L3-4 AND L4-5;  Surgeon: Lucilla Lynwood BRAVO, MD;  Location: [facility];  Service: Orthopedics;  Laterality: N/A;   ROTATOR CUFF REPAIR     TOTAL HIP ARTHROPLASTY Right 04/27/2018   Procedure: RIGHT TOTAL HIP ARTHROPLASTY ANTERIOR APPROACH;  Surgeon: Jerri Kay HERO, MD;  Location: [facility];  Service: Orthopedics;  Laterality: Right;   TUBAL LIGATION     VAGINAL HYSTERECTOMY  2008   Patient Active Problem List   Diagnosis Date Noted   Neoplasm of uncertain behavior 06/30/2023   Vaginal discharge 06/30/2023   Prediabetes 06/13/2023   Osteopenia 03/17/2023   H/O Whipple procedure 12/19/2022   Polyp of duodenum 04/03/2022   Primary osteoarthritis of left knee 11/29/2021   Trigger finger, right ring finger 04/10/2021   Trigger finger, left ring finger 02/26/2021   Asthma 12/13/2020   GERD (gastroesophageal reflux disease) 12/13/2020   Housing instability 12/13/2020   Atypical squamous cells cannot exclude high grade squamous  intraepithelial lesion on cytologic smear of cervix (ASC-H) 09/01/2020   Everitt Quervain's disease (tenosynovitis) 08/22/2020   Insomnia 06/08/2020   No-show for appointment 02/25/2020   S/P arthroscopy of right shoulder 01/05/2020   Carpal tunnel syndrome, left upper limb 10/25/2019    Class: Chronic   Carpal tunnel syndrome, right upper limb 10/25/2019    Class: Chronic   Tendinopathy of right biceps tendon 07/27/2019   Breast cancer screening by mammogram 06/07/2019   Seasonal allergic rhinitis due to pollen 06/07/2019   Colon cancer screening 06/07/2019   Lumbar stenosis with neurogenic claudication 03/16/2019    Class:  Chronic   Acute stress reaction 09/25/2018   Vitamin D  insufficiency 11/25/2013   Healthcare maintenance 11/25/2013   Depression 09/23/2011   Functional incontinence 10/18/2010   Pain in right leg 10/18/2010   Iron deficiency anemia 05/12/2008   Hyperlipemia 06/18/2007   Essential hypertension 05/20/2007    PCP: Izetta Masters DO  REFERRING PROVIDER: Renita Redell DASEN, MD   REFERRING DIAG: Primary osteoarthritis of left knee   THERAPY DIAG:  Chronic pain of left knee  Muscle weakness (generalized)  Other abnormalities of gait and mobility  Other low back pain  Rationale for Evaluation and Treatment: Rehabilitation  ONSET DATE: 4-5 years ago  SUBJECTIVE:   SUBJECTIVE STATEMENT: Pt reports her back pain is 50% improvement. Lt knee varies day to day.   POOL ACCESS: Pt does not have pool access at this time; Plans to join [facility] with silver sneakers  From initial evaluation:  Whipple procedure last August.  Needed to learn how to walk again.  R thr x 4 years ago, have spinal stenosis x 5 years with surgery. I wear a back brace everyday to supports my back. I have tingling, numbness and P&N in right hip through outside of foot. Get epidurals every 3 months which helps. I am so tired  of being in pain  PERTINENT HISTORY: Whipple Right THR Bilat Oa knees Lumbar stenosis PAIN:  Are you having pain? Yes: NPRS scale: 5/10 Lt knee, 0/10 lower back.   Pain location: see above Pain description: ache sharp Aggravating factors: sitting too long, standing 20 mins Relieving factors: heat, elevation     PRECAUTIONS: None  RED FLAGS: None   WEIGHT BEARING RESTRICTIONS: No  FALLS:  Has patient fallen in last 6 months? No  LIVING ENVIRONMENT: Lives with: lives with their daughter Lives in: House/apartment Stairs: Yes: Internal: 16 steps; on right going up Has following equipment at home: Single point cane  OCCUPATION: retired  PLOF: Independent  PATIENT GOALS: manage  pain. Strengthening general  NEXT MD VISIT: as needed  OBJECTIVE:  Note: Objective measures were completed at Evaluation unless otherwise noted.  DIAGNOSTIC FINDINGS: advanced tricompartmental osteoarthritis Left  PATIENT SURVEYS:  LEFS:31/80  COGNITION: Overall cognitive status: Within functional limits for tasks assessed     SENSATION: Numbness tingling; and P&N Right le  POSTURE: decreased lumbar lordosis, flexed trunk , and weight shift right  PALPATION: Moderate TTP left med knee joint line  LOWER EXTREMITY ROM:  HD (Lbs) Right eval Left eval  Hip flexion 28.0 13.1  Hip extension    Hip abduction 26.0 16.5  Hip adduction    Hip internal rotation    Hip external rotation    Knee flexion    Knee extension 26.6 14.8  Ankle dorsiflexion    Ankle plantarflexion    Ankle inversion    Ankle eversion     (Blank rows = not tested)  LOWER EXTREMITY MMT:  AROM Right eval Left eval Left  09/18/23  Hip flexion     Hip extension     Hip abduction     Hip adduction     Hip internal rotation     Hip external rotation     Knee flexion  72 84  Knee extension  -20 -12  Ankle dorsiflexion     Ankle plantarflexion     Ankle inversion     Ankle eversion      (Blank rows = not tested)  FUNCTIONAL TESTS:  Tug and Berg balance test to be completed as tolerated  GAIT: Distance walked: 400 ft Assistive device utilized: Single point cane Level of assistance: Complete Independence Comments: Antalgic lle off loading right, increased rue arm swing for momentum to advance lle hiking hip avoiding left knee flex during swing.                                                                                                                                TREATMENT  [facility] Adult PT Treatment:                                             09/18/23 Self care:  Prior to entry in water, instructed pt on donning/doffing DJ knee hinged knee brace she was issued  Pt seen for aquatic  therapy today.  Treatment took place in water 3.5-4.75 ft in depth at the [facility] pool. Temp of water was 91.  Pt entered/exited the pool via stairs independently in step-to and step-through pattern with bilat rail.   - UE on yellow hand floats walking forward/backward  - side stepping with arm add/abdct with rainbow hand floats  - STS at bench in water with UE on floats-> with feet on blue step  - small forward walking kicks  - UE on wall heel raises x 10; hip abdct/add 3 x 5;  - TrA set with long hollow noodle pull down to thighs x 10  Pt requires the buoyancy and hydrostatic pressure of water for support, and to offload joints by unweighting joint load by at least 50 % in navel deep water and by at least 75-80% in chest to neck deep water.  Viscosity of the water is needed for resistance of strengthening. Water current perturbations provides challenge to standing balance requiring increased core activation.      PATIENT EDUCATION:  Education details: intro to aquatic therapy  Person educated: Patient Education method: Explanation Education comprehension: verbalized understanding  HOME EXERCISE PROGRAM: TBA Pt does not have pool access at this time  ASSESSMENT:  CLINICAL IMPRESSION: Good toleration for aquatic exercise as long as she didn't force Lt knee flexion. Lt knee pain reduced to 2/10 and lower back remained 0/10. Lt knee ROM improved from eval; near meeting  goal.  Pt reporting 50% improvement in lower back since starting therapy. Pt has partially met her goals. Pt to gain access to pool prior to next scheduled visit.  Therapist to assess goals for recert/progress note.  Plan to issue and instruct on aquatic HEP over next few visits. Pt may benefit from continued PT intervention to improve overall safety with adl's and QOL.         From initial evaluation:  Patient is a 73 y.o. f who was seen today for physical therapy evaluation and treatment for oa left  knee.  Pt has vast pMhx including whipple procedure (1 year ago), lumbar surgery, carpel tunnel surgery,and right THR that has left her with LE and core weakness.  She presents using cane stating that she keeps moving despite all of her pain because she will not give in.  She has moderate constant pain in LB through right hip with radicular pattern into right foot and left knee pain. She required several position changes throughout exam (sitting, standing and walking) to tolerate.  Not all functional testing completed but will plan on completion as able.  She is a good candidate for aquatic physical therapy intervention and will benefit from the properties of water to initially manage pain and then allow for toleration to strengthening, stretching of core and LE and balance retraining to improve overall safety with adl's and QOL.  OBJECTIVE IMPAIRMENTS: Abnormal gait, decreased activity tolerance, decreased balance, decreased endurance, decreased knowledge of use of DME, decreased mobility, difficulty walking, decreased ROM, decreased strength, impaired flexibility, postural dysfunction, and pain.   ACTIVITY LIMITATIONS: carrying, lifting, bending, sitting, standing, squatting, sleeping, stairs, transfers, and locomotion level  PARTICIPATION LIMITATIONS: meal prep, cleaning, laundry, shopping, community activity, occupation, and yard work  PERSONAL FACTORS: Past/current experiences, Time since onset of injury/illness/exacerbation, and 3+ comorbidities: see pmHx are also affecting patient's functional outcome.   REHAB POTENTIAL: Good  CLINICAL DECISION MAKING: Unstable/unpredictable  EVALUATION COMPLEXITY: High   GOALS: Goals reviewed with patient? Yes  SHORT TERM GOALS: Target date: 08/20/23 Pt will tolerate full aquatic sessions consistently without increase in pain and with improving function to demonstrate good toleration and effectiveness of intervention.  Baseline: Goal status: Met  08/18/23  2.  Pt will improve left knee flex to 90d and extension to -10 Baseline:  Goal status: In progress 09/18/23  3.  Pt will tolerate walking to and from setting and engaging in aquatic therapy session without excessive fatigue or increase in pain to demonstrate improved toleration to activity Baseline:  Goal status: Met 08/20/23  4.  TUG and Berg balance  functional testing to be complete/tolerated. Baseline: unable to tolerate Goal status: INITIAL  5.  Pt will tolerate stair climbing using alternating or step to pattern ascending and descending 7 steps with use of handrail Baseline:  Goal status: Met 08/18/23   LONG TERM GOALS: Target date: 09/20/23  Pt to improve on LEFS by at least 9 point to demonstrate statistically significant Improvement in function. Baseline:  Goal status: INITIAL  2.  IN ERROR Baseline:  Goal status: INITIAL  3.  Pt will report decrease in pain by at least 50% for improved toleration to activity/quality of life and to demonstrate improved management of pain. Baseline: see chart Goal status: Partially met 09/18/23  4.  Pt will improve strength in LLE by at least 10 lbs to demonstrate improved overall physical function Baseline: see chart Goal status: INITIAL  5.  Pt will improve on Berg balance test  to >/= 45/56 to demonstrate a low fall risk. Baseline: TBT Goal status: INITIAL     PLAN:  PT FREQUENCY: 2x/week  PT DURATION: 8 weeks  PLANNED INTERVENTIONS: 97164- PT Re-evaluation, 97750- Physical Performance Testing, 97110-Therapeutic exercises, 97530- Therapeutic activity, [id number]- Neuromuscular re-education, 97535- Self Care, 02859- Manual therapy, [id number]- Gait training, [phone]- Orthotic Initial, [id number]- Aquatic Therapy, [phone]- Electrical stimulation (unattended), [phone]- Electrical stimulation (manual), [phone] (1-2 muscles), 20561 (3+ muscles)- Dry Needling, Patient/Family education, Balance training, Stair training, Taping, Joint mobilization, DME  instructions, Cryotherapy, and Moist heat  PLAN FOR NEXT SESSION: therapist to assess goals for recert.   Delon Aquas, PTA 09/18/23 5:26 PM [facility] [address] [place], [place], [address] Phone: [phone]   Fax:  [phone]       Date of referral: 06/06/23 Referring provider: Renita Redell DASEN, MD  Referring diagnosis? Primary osteoarthritis of left knee  Treatment diagnosis? (if different than referring diagnosis) no but added LBP  What was this (referring dx) caused by? Arthritis  Nature of Condition: Chronic (continuous duration > 3 months)   Laterality: Lt  Current Functional Measure Score: LEFS 31/80  Objective measurements identify impairments when they are compared to normal values, the uninvolved extremity, and prior level of function.  [x]  Yes  []  No  Objective assessment of functional ability: Moderate functional limitations   Briefly describe symptoms: Chronic left knee pain due to OA with limited ROM and strength  How did symptoms start: chronic  Average pain intensity:  Last 24 hours: 10/10  Past week: 10/10  How often does the pt experience symptoms? Constantly  How much have the symptoms interfered with usual daily activities? Quite a bit  How has condition changed since care began at this facility? NA - initial visit  In general, how is the patients overall health? Fair   BACK PAIN (STarT Back Screening Tool) Has pain spread down the leg(s) at some time in the last 2 weeks? yes Has there been pain in the shoulder or neck at some time in the last 2 weeks? no Has the pt only walked short distances because of back pain? yes Has patient dressed more slowly because of back pain in the past 2 weeks? yes Does patient think it's not safe for a person with this condition to be physically active? no Does patient have worrying thoughts a lot of the time? no Does patient feel back pain  is terrible and will never get any better? yes Has patient stopped enjoying things they usually enjoy? yes

## 2023-09-26 DIAGNOSIS — E785 Hyperlipidemia, unspecified: Secondary | ICD-10-CM | POA: Diagnosis not present

## 2023-09-26 DIAGNOSIS — Z90411 Acquired partial absence of pancreas: Secondary | ICD-10-CM | POA: Diagnosis not present

## 2023-09-26 DIAGNOSIS — J45909 Unspecified asthma, uncomplicated: Secondary | ICD-10-CM | POA: Diagnosis not present

## 2023-09-26 DIAGNOSIS — Z9049 Acquired absence of other specified parts of digestive tract: Secondary | ICD-10-CM | POA: Diagnosis not present

## 2023-09-26 DIAGNOSIS — K219 Gastro-esophageal reflux disease without esophagitis: Secondary | ICD-10-CM | POA: Diagnosis not present

## 2023-09-26 DIAGNOSIS — Z79899 Other long term (current) drug therapy: Secondary | ICD-10-CM | POA: Diagnosis not present

## 2023-09-26 DIAGNOSIS — K317 Polyp of stomach and duodenum: Secondary | ICD-10-CM | POA: Diagnosis not present

## 2023-10-15 ENCOUNTER — Ambulatory Visit (HOSPITAL_BASED_OUTPATIENT_CLINIC_OR_DEPARTMENT_OTHER): Admitting: Physical Therapy

## 2023-10-22 ENCOUNTER — Telehealth (HOSPITAL_BASED_OUTPATIENT_CLINIC_OR_DEPARTMENT_OTHER): Payer: Self-pay | Admitting: Physical Therapy

## 2023-10-22 ENCOUNTER — Ambulatory Visit (HOSPITAL_BASED_OUTPATIENT_CLINIC_OR_DEPARTMENT_OTHER): Attending: Pain Medicine | Admitting: Physical Therapy

## 2023-10-22 DIAGNOSIS — G8929 Other chronic pain: Secondary | ICD-10-CM | POA: Insufficient documentation

## 2023-10-22 DIAGNOSIS — M5459 Other low back pain: Secondary | ICD-10-CM | POA: Insufficient documentation

## 2023-10-22 DIAGNOSIS — M25562 Pain in left knee: Secondary | ICD-10-CM | POA: Insufficient documentation

## 2023-10-22 DIAGNOSIS — R2689 Other abnormalities of gait and mobility: Secondary | ICD-10-CM | POA: Insufficient documentation

## 2023-10-22 DIAGNOSIS — M6281 Muscle weakness (generalized): Secondary | ICD-10-CM | POA: Insufficient documentation

## 2023-10-22 NOTE — Telephone Encounter (Signed)
 LVM: missed visit, 3rd late cancel/NS. Pt reminded of attendance policy. POC ended 8/9. Pt has 2 more sessions scheduled. Will keep next visit but cancel last.  She is informed if she misses net session she will be DC and will need new referral for continued service.  Ronal Lonerock) Joshawa Dubin MPT 10/22/23 4:37 PM [facility] [address] [place], [place], [address] Phone: [phone]   Fax:  [phone]

## 2023-10-30 ENCOUNTER — Ambulatory Visit (HOSPITAL_BASED_OUTPATIENT_CLINIC_OR_DEPARTMENT_OTHER): Admitting: Physical Therapy

## 2023-10-31 ENCOUNTER — Encounter: Attending: Physical Medicine & Rehabilitation | Admitting: Physical Medicine & Rehabilitation

## 2023-11-06 ENCOUNTER — Ambulatory Visit (HOSPITAL_BASED_OUTPATIENT_CLINIC_OR_DEPARTMENT_OTHER): Admitting: Physical Therapy

## 2023-11-24 ENCOUNTER — Telehealth: Payer: Self-pay

## 2023-11-24 NOTE — Telephone Encounter (Signed)
 Patient called office to see if she could get a Toradol  injection, please advise, thanks.

## 2023-11-25 ENCOUNTER — Ambulatory Visit: Admitting: Orthopaedic Surgery

## 2023-12-02 ENCOUNTER — Ambulatory Visit: Admitting: Family Medicine

## 2023-12-08 ENCOUNTER — Ambulatory Visit: Admitting: Family Medicine

## 2023-12-11 ENCOUNTER — Telehealth: Payer: Self-pay

## 2023-12-11 ENCOUNTER — Other Ambulatory Visit: Payer: Self-pay | Admitting: Physical Medicine and Rehabilitation

## 2023-12-11 ENCOUNTER — Telehealth: Payer: Self-pay | Admitting: Physician Assistant

## 2023-12-11 DIAGNOSIS — M5416 Radiculopathy, lumbar region: Secondary | ICD-10-CM

## 2023-12-11 NOTE — Telephone Encounter (Signed)
 Pt called requesting a call from PA [place] about tingling in both her hands. Please call pt at [phone]

## 2023-12-11 NOTE — Telephone Encounter (Signed)
 Last injection lower back 6/25 % 80 relief/function ability Duration of relief/Improvement--4 months Recent falls or injuries--none Pain score--8 Same pain and same location lower back

## 2023-12-11 NOTE — Telephone Encounter (Signed)
 Pre Procedural Valium --[facility]

## 2023-12-12 ENCOUNTER — Other Ambulatory Visit: Payer: Self-pay | Admitting: Physical Medicine and Rehabilitation

## 2023-12-12 MED ORDER — DIAZEPAM 5 MG PO TABS: ORAL_TABLET | ORAL | 0 refills | Status: AC

## 2023-12-12 NOTE — Telephone Encounter (Signed)
 If she calls back please make appt. Thanks.

## 2023-12-12 NOTE — Telephone Encounter (Signed)
 Forwarded to vm when I tried to call.  We have only ever seen her for her knees and lumbar radiculopathy which was a few years ago.  Looks like she has a hx of carpal tunnel syndrome with left ctr several years ago.  Would recommend fu appt to further discuss and evaluate her symptoms

## 2023-12-15 ENCOUNTER — Encounter: Payer: Self-pay | Admitting: Radiology

## 2023-12-22 ENCOUNTER — Ambulatory Visit (INDEPENDENT_AMBULATORY_CARE_PROVIDER_SITE_OTHER): Admitting: Physical Medicine and Rehabilitation

## 2023-12-22 ENCOUNTER — Other Ambulatory Visit: Payer: Self-pay

## 2023-12-22 VITALS — BP 158/109 | HR 87

## 2023-12-22 DIAGNOSIS — M5416 Radiculopathy, lumbar region: Secondary | ICD-10-CM | POA: Diagnosis not present

## 2023-12-22 MED ORDER — METHYLPREDNISOLONE ACETATE 40 MG/ML IJ SUSP
40.0000 mg | Freq: Once | INTRAMUSCULAR | Status: AC
  Administered 2023-12-22: 40 mg

## 2023-12-22 NOTE — Progress Notes (Signed)
 Pain Scale   Average Pain 10 Patient advising she has chronic lower back pain radiating ro right hip and leg, pain is constant. Patient advising she forgot to take her blood pressure medication till later today and blood pressure may be elevated. Patient advised to take medication as prescribed by Physician at advise PCP if blood pressure continues to be elevated.         +Driver, -BT, -Dye Allergies.

## 2023-12-22 NOTE — Progress Notes (Signed)
 [name] - 73 y.o. female MRN [id number]  Date of birth: 1950-04-23  Office Visit Note: Visit Date: 12/22/2023 PCP: Waymond Cart, MD Referred by: Waymond Cart, MD  Subjective: Chief Complaint  Patient presents with   Lower Back - Pain   HPI:  Olivia Ewing is a 73 y.o. female who comes in today for planned repeat Right S1-2  Lumbar Transforaminal epidural steroid injection with fluoroscopic guidance.  The patient has failed conservative care including home exercise, medications, time and activity modification.  This injection will be diagnostic and hopefully therapeutic.  Please see requesting physician notes for further details and justification. Patient received more than 50% pain relief from prior injection.   Referring: Duwaine Pouch, FNP   ROS Otherwise per HPI.  Assessment & Plan: Visit Diagnoses:    ICD-10-CM   1. Lumbar radiculopathy  M54.16 XR C-ARM NO REPORT    Epidural Steroid injection    methylPREDNISolone  acetate (DEPO-MEDROL ) injection 40 mg      Plan: No additional findings.   Meds & Orders:  Meds ordered this encounter  Medications   methylPREDNISolone  acetate (DEPO-MEDROL ) injection 40 mg    Orders Placed This Encounter  Procedures   XR C-ARM NO REPORT   Epidural Steroid injection    Follow-up: Return for visit to requesting provider as needed.   Procedures: No procedures performed  S1 Lumbosacral Transforaminal Epidural Steroid Injection - Sub-Pedicular Approach with Fluoroscopic Guidance   Patient: Olivia Ewing      Date of Birth: 04-23-1950 MRN: [id number] PCP: Waymond Cart, MD      Visit Date: 12/22/2023   Universal Protocol:    Date/Time: 11/10/252:59 PM  Consent Given By: the patient  Position:  PRONE  Additional Comments: Vital signs were monitored before and after the procedure. Patient was prepped and draped in the usual sterile fashion. The correct patient, procedure, and site was verified.   Injection  Procedure Details:  Procedure Site One Meds Administered:  Meds ordered this encounter  Medications   methylPREDNISolone  acetate (DEPO-MEDROL ) injection 40 mg    Laterality: Right  Location/Site:  S1 Foramen   Needle size: 22 ga.  Needle type: Spinal  Needle Placement: Transforaminal  Findings:   -Comments: Excellent flow of contrast along the nerve, nerve root and into the epidural space.  Epidurogram: Contrast epidurogram showed no nerve root cut off or restricted flow pattern.  Procedure Details: After squaring off the sacral end-plate to get a true AP view, the C-arm was positioned so that the best possible view of the S1 foramen was visualized. The soft tissues overlying this structure were infiltrated with 2-3 ml. of 1% Lidocaine  without Epinephrine .    The spinal needle was inserted toward the target using a trajectory view along the fluoroscope beam.  Under AP and lateral visualization, the needle was advanced so it did not puncture dura. Biplanar projections were used to confirm position. Aspiration was confirmed to be negative for CSF and/or blood. A 1-2 ml. volume of Isovue -250 was injected and flow of contrast was noted at each level. Radiographs were obtained for documentation purposes.   After attaining the desired flow of contrast documented above, a 0.5 to 1.0 ml test dose of 0.25% Marcaine  was injected into each respective transforaminal space.  The patient was observed for 90 seconds post injection.  After no sensory deficits were reported, and normal lower extremity motor function was noted,   the above injectate was administered so that equal amounts of the injectate  were placed at each foramen (level) into the transforaminal epidural space.   Additional Comments:  The patient tolerated the procedure well Dressing: Band-Aid with 2 x 2 sterile gauze    Post-procedure details: Patient was observed during the procedure. Post-procedure instructions were  reviewed.  Patient left the clinic in stable condition.   Clinical History: CLINICAL DATA:  Lumbar radiculopathy, symptoms persist with > 6 wks treatment   EXAM: MRI LUMBAR SPINE WITHOUT CONTRAST   TECHNIQUE: Multiplanar, multisequence MR imaging of the lumbar spine was performed. No intravenous contrast was administered.   COMPARISON:  MRI lumbar spine December 30, 2018.   FINDINGS: Segmentation: Standard segmentation is assumed. The inferior-most fully formed intervertebral disc labeled L5-S1.   Alignment:  No substantial sagittal subluxation.   Vertebrae: Degenerative/discogenic endplate signal changes at multiple levels. No specific evidence of acute fracture, discitis/osteomyelitis, or suspicious bone lesion.   Conus medullaris and cauda equina: Conus extends to the L1-L2 level. Conus appears normal.   Paraspinal and other soft tissues: Partially imaged left renal cyst.   Disc levels:   T12-L1: No significant disc protrusion, foraminal stenosis, or canal stenosis.   L1-L2: Broad disc bulging with superimposed left subarticular disc protrusion. Bilateral facet hypertrophy and ligamentum flavum thickening. Resulting moderate to severe left subarticular recess stenosis with potential for impingement. Mild central canal stenosis and mild foraminal stenosis.   L2-L3: Disc bulging and endplate spurring. Left greater than right facet arthropathy. Ligamentum flavum thickening. Resulting mild-to-moderate left and mild right foraminal stenosis. Patent central canal.   L3-L4: Broad disc bulge. Evidence of prior postoperative changes posteriorly. Bilateral facet arthropathy. Moderate right and severe left foraminal stenosis, which is progressed. Patent central canal.   L4-L5: Postoperative changes posteriorly. Broad disc bulging. Bilateral facet arthropathy. Severe left and moderate right foraminal stenosis, progressed. Patent central canal.   L5-S1: Disc bulging and  endplate spurring with superimposed small central disc protrusion. Resulting moderate right subarticular recess stenosis. Mild central canal stenosis. Mild left foraminal stenosis.   IMPRESSION: 1. 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 Canal stenosis is improved at L4-L5, now patent. 2. At L1-L2, progressive moderate to severe left subarticular recess stenosis. 3. At L5-S1, progressive moderate right subarticular recess narrowing. Mild central canal and left foraminal stenosis. 4. At L2-L3, mild to moderate left and mild right foraminal stenosis.     Electronically Signed   By: Gilmore GORMAN Molt M.D.   On: 07/05/2022 08:52     Objective:  VS:  HT:    WT:   BMI:     BP:(!) 158/109  HR:87bpm  TEMP: ( )  RESP:  Physical Exam Vitals and nursing note reviewed.  Constitutional:      General: She is not in acute distress.    Appearance: Normal appearance. She is not ill-appearing.  HENT:     Head: Normocephalic and atraumatic.     Right Ear: External ear normal.     Left Ear: External ear normal.  Eyes:     Extraocular Movements: Extraocular movements intact.  Cardiovascular:     Rate and Rhythm: Normal rate.     Pulses: Normal pulses.  Pulmonary:     Effort: Pulmonary effort is normal. No respiratory distress.  Abdominal:     General: There is no distension.     Palpations: Abdomen is soft.  Musculoskeletal:        General: Tenderness present.     Cervical back: Neck supple.  Right lower leg: No edema.     Left lower leg: No edema.     Comments: Patient has good distal strength with no pain over the greater trochanters.  No clonus or focal weakness.  Skin:    Findings: No erythema, lesion or rash.  Neurological:     General: No focal deficit present.     Mental Status: She is alert and oriented to person, place, and time.     Sensory: No sensory deficit.     Motor: No weakness or abnormal muscle tone.      Coordination: Coordination normal.  Psychiatric:        Mood and Affect: Mood normal.        Behavior: Behavior normal.      Imaging: No results found.

## 2023-12-22 NOTE — Procedures (Signed)
 S1 Lumbosacral Transforaminal Epidural Steroid Injection - Sub-Pedicular Approach with Fluoroscopic Guidance   Patient: Olivia Ewing      Date of Birth: 1950/03/07 MRN: [id number] PCP: Waymond Cart, MD      Visit Date: 12/22/2023   Universal Protocol:    Date/Time: 11/10/252:59 PM  Consent Given By: the patient  Position:  PRONE  Additional Comments: Vital signs were monitored before and after the procedure. Patient was prepped and draped in the usual sterile fashion. The correct patient, procedure, and site was verified.   Injection Procedure Details:  Procedure Site One Meds Administered:  Meds ordered this encounter  Medications   methylPREDNISolone  acetate (DEPO-MEDROL ) injection 40 mg    Laterality: Right  Location/Site:  S1 Foramen   Needle size: 22 ga.  Needle type: Spinal  Needle Placement: Transforaminal  Findings:   -Comments: Excellent flow of contrast along the nerve, nerve root and into the epidural space.  Epidurogram: Contrast epidurogram showed no nerve root cut off or restricted flow pattern.  Procedure Details: After squaring off the sacral end-plate to get a true AP view, the C-arm was positioned so that the best possible view of the S1 foramen was visualized. The soft tissues overlying this structure were infiltrated with 2-3 ml. of 1% Lidocaine  without Epinephrine .    The spinal needle was inserted toward the target using a trajectory view along the fluoroscope beam.  Under AP and lateral visualization, the needle was advanced so it did not puncture dura. Biplanar projections were used to confirm position. Aspiration was confirmed to be negative for CSF and/or blood. A 1-2 ml. volume of Isovue -250 was injected and flow of contrast was noted at each level. Radiographs were obtained for documentation purposes.   After attaining the desired flow of contrast documented above, a 0.5 to 1.0 ml test dose of 0.25% Marcaine  was injected into each  respective transforaminal space.  The patient was observed for 90 seconds post injection.  After no sensory deficits were reported, and normal lower extremity motor function was noted,   the above injectate was administered so that equal amounts of the injectate were placed at each foramen (level) into the transforaminal epidural space.   Additional Comments:  The patient tolerated the procedure well Dressing: Band-Aid with 2 x 2 sterile gauze    Post-procedure details: Patient was observed during the procedure. Post-procedure instructions were reviewed.  Patient left the clinic in stable condition.

## 2023-12-25 ENCOUNTER — Other Ambulatory Visit: Payer: Self-pay

## 2023-12-25 DIAGNOSIS — F32A Depression, unspecified: Secondary | ICD-10-CM

## 2023-12-25 MED ORDER — FLUOXETINE HCL 20 MG PO CAPS
20.0000 mg | ORAL_CAPSULE | Freq: Every day | ORAL | 3 refills | Status: AC
Start: 2023-12-25 — End: 2024-12-24

## 2023-12-26 ENCOUNTER — Telehealth: Payer: Self-pay | Admitting: Physical Medicine and Rehabilitation

## 2023-12-26 NOTE — Telephone Encounter (Signed)
 Pt called to say that the Epidural shot she received is doing great and that she just wants to thank you. Call back number is [phone]

## 2024-02-19 ENCOUNTER — Ambulatory Visit: Payer: Self-pay

## 2024-02-19 NOTE — Telephone Encounter (Signed)
 FYI Only or Action Required?: FYI only for provider: appointment scheduled on 02/24/24.  Patient was last seen in primary care on 06/30/2023 by Volney Leash, MD.  Called Nurse Triage reporting Dizziness.  Symptoms began x 2 weeks ago.  Interventions attempted: Rest, hydration, or home remedies.  Symptoms are: unchanged.  Triage Disposition: See PCP When Office is Open (Within 3 Days)  Patient/caregiver understands and will follow disposition?: Yes             Copied from CRM #[id number]. Topic: Clinical - Red Word Triage >> Feb 19, 2024  3:01 PM Chiquita SQUIBB wrote: Red Word that prompted transfer to Nurse Triage: Patient is calling in stating that she has been feeling off balance and dizzy, that comes and goes for the last two weeks. >> Feb 19, 2024  3:03 PM Chiquita SQUIBB wrote: Patient is requesting a call back as she had an important call coming in.  Reason for Disposition  [1] MILD dizziness (e.g., walking normally) AND [2] has NOT been evaluated by doctor (or NP/PA) for this  (Exception: Dizziness caused by heat exposure, sudden standing, or poor fluid intake.)  Answer Assessment - Initial Assessment Questions 1. DESCRIPTION: Describe your dizziness.     Dizziness intermittently for the past 2 weeks  2. LIGHTHEADED: Do you feel lightheaded? (e.g., somewhat faint, woozy, weak upon standing)     Yes, at times   3. VERTIGO: Do you feel like either you or the room is spinning or tilting? (i.e., vertigo)     No    4. SEVERITY: How bad is it?  Do you feel like you are going to faint? Can you stand and walk?     Mild    5. ONSET:  When did the dizziness begin?     X 2 weeks    6. AGGRAVATING FACTORS: Does anything make it worse? (e.g., standing, change in head position)     Laying down for a nap helps   8. CAUSE: What do you think is causing the dizziness? (e.g., decreased fluids or food, diarrhea, emotional distress, heat exposure, new medicine,  sudden standing, vomiting; unknown)     Unsure    9. RECURRENT SYMPTOM: Have you had dizziness before? If Yes, ask: When was the last time? What happened that time?     No   10. OTHER SYMPTOMS: Do you have any other symptoms? (e.g., fever, chest pain, vomiting, diarrhea, bleeding)       No     Patient called in to triage with complaints of dizziness/lightheadedness.  This has been ongoing for x 2 weeks The patient stated the lightheadedness is intermittent. When she rests it help with the severity. She described the dizziness as mild, but is unsure of the cause. No other symptoms noted.    Appointment scheduled for further evaluation; Patient agrees with the plan of care, and will reach out if symptoms worsen or persist.  Protocols used: Dizziness - Lightheadedness-A-AH

## 2024-02-19 NOTE — Telephone Encounter (Signed)
 Pt has an appt scheduled 1/13 with Dr Kem.

## 2024-02-24 ENCOUNTER — Ambulatory Visit: Payer: Self-pay

## 2024-03-12 ENCOUNTER — Telehealth: Payer: Self-pay | Admitting: Orthopaedic Surgery

## 2024-03-12 NOTE — Telephone Encounter (Signed)
 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 She says that she is having discomfort and that when she walks she feels off balance. Call back number is [phone].

## 2024-03-16 ENCOUNTER — Other Ambulatory Visit: Payer: Self-pay

## 2024-03-16 MED ORDER — QUETIAPINE FUMARATE 100 MG PO TABS: 100.0000 mg | ORAL_TABLET | Freq: Every day | ORAL | 0 refills | Status: AC

## 2024-03-16 NOTE — Telephone Encounter (Signed)
 Unable to reach pt to sch her an appt, left detailed message to call the clinic back. Pharmacy is requesting a refill. Pt last appt with [facility] is on 06/30/2023, no upcoming appt. Please advised.

## 2024-03-25 ENCOUNTER — Ambulatory Visit: Admitting: Orthopaedic Surgery
